# Patient Record
Sex: Male | Born: 1935
Health system: Southern US, Community
[De-identification: ages and names within clinical notes are randomized; demographics above are authoritative.]

## PROBLEM LIST (undated history)

## (undated) DIAGNOSIS — Z87442 Personal history of urinary calculi: Secondary | ICD-10-CM

## (undated) DIAGNOSIS — I10 Essential (primary) hypertension: Secondary | ICD-10-CM

## (undated) DIAGNOSIS — I5032 Chronic diastolic (congestive) heart failure: Secondary | ICD-10-CM

## (undated) DIAGNOSIS — E119 Type 2 diabetes mellitus without complications: Secondary | ICD-10-CM

## (undated) DIAGNOSIS — I350 Nonrheumatic aortic (valve) stenosis: Secondary | ICD-10-CM

## (undated) DIAGNOSIS — G629 Polyneuropathy, unspecified: Secondary | ICD-10-CM

## (undated) DIAGNOSIS — R0600 Dyspnea, unspecified: Secondary | ICD-10-CM

## (undated) DIAGNOSIS — Z8719 Personal history of other diseases of the digestive system: Secondary | ICD-10-CM

## (undated) DIAGNOSIS — L920 Granuloma annulare: Secondary | ICD-10-CM

## (undated) DIAGNOSIS — J449 Chronic obstructive pulmonary disease, unspecified: Secondary | ICD-10-CM

## (undated) DIAGNOSIS — N183 Chronic kidney disease, stage 3 unspecified: Secondary | ICD-10-CM

## (undated) DIAGNOSIS — N184 Chronic kidney disease, stage 4 (severe): Secondary | ICD-10-CM

## (undated) DIAGNOSIS — Z972 Presence of dental prosthetic device (complete) (partial): Secondary | ICD-10-CM

## (undated) DIAGNOSIS — D649 Anemia, unspecified: Secondary | ICD-10-CM

## (undated) DIAGNOSIS — R238 Other skin changes: Secondary | ICD-10-CM

## (undated) DIAGNOSIS — G4733 Obstructive sleep apnea (adult) (pediatric): Secondary | ICD-10-CM

## (undated) DIAGNOSIS — T4145XA Adverse effect of unspecified anesthetic, initial encounter: Secondary | ICD-10-CM

## (undated) DIAGNOSIS — T8859XA Other complications of anesthesia, initial encounter: Secondary | ICD-10-CM

## (undated) DIAGNOSIS — R233 Spontaneous ecchymoses: Secondary | ICD-10-CM

## (undated) DIAGNOSIS — I639 Cerebral infarction, unspecified: Secondary | ICD-10-CM

## (undated) DIAGNOSIS — E669 Obesity, unspecified: Secondary | ICD-10-CM

## (undated) DIAGNOSIS — F40240 Claustrophobia: Secondary | ICD-10-CM

## (undated) DIAGNOSIS — K219 Gastro-esophageal reflux disease without esophagitis: Secondary | ICD-10-CM

## (undated) DIAGNOSIS — R531 Weakness: Secondary | ICD-10-CM

## (undated) DIAGNOSIS — I2510 Atherosclerotic heart disease of native coronary artery without angina pectoris: Secondary | ICD-10-CM

## (undated) DIAGNOSIS — M1990 Unspecified osteoarthritis, unspecified site: Secondary | ICD-10-CM

## (undated) DIAGNOSIS — Z973 Presence of spectacles and contact lenses: Secondary | ICD-10-CM

## (undated) DIAGNOSIS — E785 Hyperlipidemia, unspecified: Secondary | ICD-10-CM

## (undated) DIAGNOSIS — N400 Enlarged prostate without lower urinary tract symptoms: Secondary | ICD-10-CM

## (undated) DIAGNOSIS — R197 Diarrhea, unspecified: Secondary | ICD-10-CM

## (undated) HISTORY — PX: CARDIAC CATHETERIZATION: SHX172

## (undated) HISTORY — PX: EYE SURGERY: SHX253

## (undated) HISTORY — DX: Type 2 diabetes mellitus without complications: E11.9

## (undated) HISTORY — PX: LIPOMA EXCISION: SHX5283

## (undated) HISTORY — PX: BLEPHAROPLASTY: SUR158

## (undated) HISTORY — PX: OTHER SURGICAL HISTORY: SHX169

## (undated) HISTORY — DX: Benign prostatic hyperplasia without lower urinary tract symptoms: N40.0

## (undated) HISTORY — PX: COLONOSCOPY: SHX174

## (undated) HISTORY — DX: Gastro-esophageal reflux disease without esophagitis: K21.9

## (undated) HISTORY — PX: LITHOTRIPSY: SUR834

## (undated) HISTORY — DX: Hyperlipidemia, unspecified: E78.5

## (undated) HISTORY — DX: Atherosclerotic heart disease of native coronary artery without angina pectoris: I25.10

## (undated) HISTORY — DX: Granuloma annulare: L92.0

---

## 2000-03-29 ENCOUNTER — Other Ambulatory Visit: Admission: RE | Admit: 2000-03-29 | Discharge: 2000-03-29 | Payer: Self-pay | Admitting: Urology

## 2000-06-07 ENCOUNTER — Other Ambulatory Visit: Admission: RE | Admit: 2000-06-07 | Discharge: 2000-06-07 | Payer: Self-pay | Admitting: Urology

## 2000-08-02 ENCOUNTER — Other Ambulatory Visit: Admission: RE | Admit: 2000-08-02 | Discharge: 2000-08-02 | Payer: Self-pay | Admitting: Urology

## 2011-04-24 DIAGNOSIS — M109 Gout, unspecified: Secondary | ICD-10-CM | POA: Diagnosis not present

## 2011-04-24 DIAGNOSIS — N400 Enlarged prostate without lower urinary tract symptoms: Secondary | ICD-10-CM | POA: Diagnosis not present

## 2011-04-24 DIAGNOSIS — L600 Ingrowing nail: Secondary | ICD-10-CM | POA: Diagnosis not present

## 2011-04-24 DIAGNOSIS — J449 Chronic obstructive pulmonary disease, unspecified: Secondary | ICD-10-CM | POA: Diagnosis not present

## 2011-04-24 DIAGNOSIS — I2510 Atherosclerotic heart disease of native coronary artery without angina pectoris: Secondary | ICD-10-CM | POA: Diagnosis not present

## 2011-04-24 DIAGNOSIS — E782 Mixed hyperlipidemia: Secondary | ICD-10-CM | POA: Diagnosis not present

## 2011-04-24 DIAGNOSIS — F17200 Nicotine dependence, unspecified, uncomplicated: Secondary | ICD-10-CM | POA: Diagnosis not present

## 2011-04-24 DIAGNOSIS — K219 Gastro-esophageal reflux disease without esophagitis: Secondary | ICD-10-CM | POA: Diagnosis not present

## 2011-05-01 DIAGNOSIS — M79609 Pain in unspecified limb: Secondary | ICD-10-CM | POA: Diagnosis not present

## 2011-05-01 DIAGNOSIS — M2010 Hallux valgus (acquired), unspecified foot: Secondary | ICD-10-CM | POA: Diagnosis not present

## 2011-05-01 DIAGNOSIS — L600 Ingrowing nail: Secondary | ICD-10-CM | POA: Diagnosis not present

## 2011-05-05 DIAGNOSIS — H6500 Acute serous otitis media, unspecified ear: Secondary | ICD-10-CM | POA: Diagnosis not present

## 2011-05-05 DIAGNOSIS — IMO0002 Reserved for concepts with insufficient information to code with codable children: Secondary | ICD-10-CM | POA: Diagnosis not present

## 2011-05-08 DIAGNOSIS — L600 Ingrowing nail: Secondary | ICD-10-CM | POA: Diagnosis not present

## 2011-05-10 ENCOUNTER — Encounter: Payer: Self-pay | Admitting: Pulmonary Disease

## 2011-05-11 ENCOUNTER — Encounter: Payer: Self-pay | Admitting: Pulmonary Disease

## 2011-05-11 ENCOUNTER — Ambulatory Visit (INDEPENDENT_AMBULATORY_CARE_PROVIDER_SITE_OTHER): Payer: Medicare Other | Admitting: Pulmonary Disease

## 2011-05-11 ENCOUNTER — Ambulatory Visit (INDEPENDENT_AMBULATORY_CARE_PROVIDER_SITE_OTHER)
Admission: RE | Admit: 2011-05-11 | Discharge: 2011-05-11 | Disposition: A | Discharge status: Home / Self Care | Payer: Medicare Other | Source: Ambulatory Visit | Attending: Pulmonary Disease | Admitting: Pulmonary Disease

## 2011-05-11 DIAGNOSIS — J42 Unspecified chronic bronchitis: Secondary | ICD-10-CM | POA: Diagnosis not present

## 2011-05-11 DIAGNOSIS — J449 Chronic obstructive pulmonary disease, unspecified: Secondary | ICD-10-CM

## 2011-05-11 DIAGNOSIS — G4733 Obstructive sleep apnea (adult) (pediatric): Secondary | ICD-10-CM | POA: Insufficient documentation

## 2011-05-11 NOTE — Patient Instructions (Addendum)
Breathing test showed lung capacity of 65% Chest xray Schedule sleep study for obstructive sleep apnea  Trial of spiriva instead of atrovent - stop if prostate problems worse- call for Rx if works [place] !!!

## 2011-05-11 NOTE — Progress Notes (Signed)
  Subjective:    Patient ID: Marco Acevedo, male    DOB: [birth year], 77 y.o.   MRN: [id number]  HPI 76/M,smoker ,  just moved back to [place] from [place] , presents for evaluation of COPD Uses [place] for meds usess foradil 2-3/wk, lost nebuliser during move, atrovent HFA as needed He also has CAD s/p stents x 2 on plavix, Htn on lisinopril & neuropathy He had 1 episode of bronchitis in the last 6 months, denies wheezing or pedal edema, occasional cough with clear phlegm. He started smoking at age 3, about 1PPd now, retired from [employer]. He has gained 14 lbs in the last 2 years. Spirometry showed moderate airway obstruction with FEV1 of 66% FVC of 71% and small airways at 55%.  CXR - no infiltrates, hyperinflation + Wife reports loud snoring & occasional witnessed apneas. He reports excessive daytime somnolence ESS 3/24 Bedtime is between 10 PM and midnight, sleep latency is 10 minutes, 3-4 nocturnal awakenings, out of bed at 8 AM feeling tired with dryness of mouth and occasional headache. He has gained 14 pounds over the last 2 years.  There is no history suggestive of cataplexy, sleep paralysis or parasomnias    Review of Systems  Constitutional: Negative for fever, appetite change and unexpected weight change.  HENT: Positive for ear pain, congestion, rhinorrhea and postnasal drip. Negative for sore throat, sneezing, trouble swallowing and dental problem.   Eyes: Negative for redness.  Respiratory: Positive for cough, shortness of breath and wheezing.   Cardiovascular: Negative for chest pain, palpitations and leg swelling.  Gastrointestinal: Negative for nausea, vomiting, abdominal pain and diarrhea.  Genitourinary: Negative for dysuria and urgency.  Musculoskeletal: Negative for joint swelling.  Skin: Negative for rash.  Neurological: Negative for syncope and headaches.  Hematological: Bruises/bleeds easily.  Psychiatric/Behavioral: Negative for dysphoric mood. The patient is not  nervous/anxious.        Objective:   Physical Exam  Gen. Pleasant, well-nourished, in no distress, normal affect ENT - no lesions, no post nasal drip Neck: No JVD, no thyromegaly, no carotid bruits Lungs: no use of accessory muscles, no dullness to percussion, decreased BL  without rales or rhonchi  Cardiovascular: Rhythm regular, heart sounds  normal, no murmurs or gallops, no peripheral edema Abdomen: soft and non-tender, no hepatosplenomegaly, BS normal. Musculoskeletal: No deformities, no cyanosis or clubbing Neuro:  alert, non focal       Assessment & Plan:

## 2011-05-11 NOTE — Assessment & Plan Note (Signed)
Given excessive daytime somnolence, narrow pharyngeal exam, witnessed apneas & loud snoring, obstructive sleep apnea is very likely & an overnight polysomnogram will be scheduled as a split study. The pathophysiology of obstructive sleep apnea , it's cardiovascular consequences & modes of treatment including CPAP were discused with the patient in detail & they evidenced understanding.  

## 2011-05-11 NOTE — Assessment & Plan Note (Signed)
Trial of spiriva instead of atrovent - stop if prostate problems worse- Stay on foradil for now. Pulm rehab in future Smoking cessation primary intervention here

## 2011-05-17 DIAGNOSIS — I2510 Atherosclerotic heart disease of native coronary artery without angina pectoris: Secondary | ICD-10-CM | POA: Diagnosis not present

## 2011-05-17 DIAGNOSIS — E119 Type 2 diabetes mellitus without complications: Secondary | ICD-10-CM | POA: Diagnosis not present

## 2011-05-17 DIAGNOSIS — E782 Mixed hyperlipidemia: Secondary | ICD-10-CM | POA: Diagnosis not present

## 2011-05-17 DIAGNOSIS — F17200 Nicotine dependence, unspecified, uncomplicated: Secondary | ICD-10-CM | POA: Diagnosis not present

## 2011-05-17 DIAGNOSIS — J449 Chronic obstructive pulmonary disease, unspecified: Secondary | ICD-10-CM | POA: Diagnosis not present

## 2011-05-18 DIAGNOSIS — L600 Ingrowing nail: Secondary | ICD-10-CM | POA: Diagnosis not present

## 2011-05-23 ENCOUNTER — Telehealth: Payer: Self-pay | Admitting: Pulmonary Disease

## 2011-05-23 ENCOUNTER — Ambulatory Visit (HOSPITAL_BASED_OUTPATIENT_CLINIC_OR_DEPARTMENT_OTHER): Payer: Medicare Other | Attending: Pulmonary Disease | Admitting: General Practice

## 2011-05-23 VITALS — Ht 69.0 in | Wt 210.0 lb

## 2011-05-23 DIAGNOSIS — G4733 Obstructive sleep apnea (adult) (pediatric): Secondary | ICD-10-CM | POA: Insufficient documentation

## 2011-05-23 DIAGNOSIS — Z7982 Long term (current) use of aspirin: Secondary | ICD-10-CM | POA: Diagnosis not present

## 2011-05-23 DIAGNOSIS — J449 Chronic obstructive pulmonary disease, unspecified: Secondary | ICD-10-CM | POA: Diagnosis not present

## 2011-05-23 DIAGNOSIS — R259 Unspecified abnormal involuntary movements: Secondary | ICD-10-CM | POA: Diagnosis not present

## 2011-05-23 DIAGNOSIS — F17200 Nicotine dependence, unspecified, uncomplicated: Secondary | ICD-10-CM | POA: Insufficient documentation

## 2011-05-23 DIAGNOSIS — J4489 Other specified chronic obstructive pulmonary disease: Secondary | ICD-10-CM | POA: Insufficient documentation

## 2011-05-23 DIAGNOSIS — Z79899 Other long term (current) drug therapy: Secondary | ICD-10-CM | POA: Insufficient documentation

## 2011-05-23 NOTE — Telephone Encounter (Signed)
LMOM for pt TCB 

## 2011-05-24 MED ORDER — TIOTROPIUM BROMIDE MONOHYDRATE 18 MCG IN CAPS: 18.0000 ug | ORAL_CAPSULE | Freq: Every day | RESPIRATORY_TRACT | Status: DC

## 2011-05-24 NOTE — Telephone Encounter (Signed)
Patient states he is having no problems with urination on Spiriva and would like an Rx faxed to the [place]. Patient aware RA is not back in the office until next week to sign prescription. I will print RX and last OV note and fax to [facility] after RA signs. Prescription and RX placed in RA look at, along with [facility] fax number.

## 2011-05-24 NOTE — Telephone Encounter (Signed)
Ok to send Rx 

## 2011-05-25 DIAGNOSIS — M79609 Pain in unspecified limb: Secondary | ICD-10-CM | POA: Diagnosis not present

## 2011-05-25 DIAGNOSIS — B353 Tinea pedis: Secondary | ICD-10-CM | POA: Diagnosis not present

## 2011-05-30 DIAGNOSIS — G4733 Obstructive sleep apnea (adult) (pediatric): Secondary | ICD-10-CM

## 2011-05-30 DIAGNOSIS — G4761 Periodic limb movement disorder: Secondary | ICD-10-CM

## 2011-05-30 NOTE — Procedures (Signed)
NAME:  Marco Acevedo, Marco Acevedo NO.:  [id number]  MEDICAL RECORD NO.:  [id number]          PATIENT TYPE:  OUT  LOCATION:  SLEEP CENTER                 FACILITY:  [facility]  PHYSICIAN:  Rigoberto Noel, MD      DATE OF BIRTH:  10-Apr-1935  DATE OF STUDY:  05/23/2011                           NOCTURNAL POLYSOMNOGRAM  REFERRING PHYSICIAN:  Rigoberto Noel, MD  INDICATION FOR STUDY:  Mr. Topping is a 77 year old smoker with moderate COPD, who presents with loud snoring, occasional witnessed apneas, excessive daytime somnolence, and a weight gain of 14 pounds over the last 2 years.  At the time of this study, he weighed 210 pounds with a height of 5 feet and 9 inches, BMI of 31.  Neck size of 19 inches.  EPWORTH SLEEPINESS SCORE:  4.  MEDICATIONS:  Clopidogrel, primidone, gabapentin, guaifenesin, gemfibrozil, Atrovent, albuterol, Spiriva, lisinopril, pravastatin, allopurinol, glyburide, finasteride, metformin, aspirin, iron, and B12.  This nocturnal polysomnogram was performed with sleep technologist in attendance.  EEG, EOG, EMG, EKG, and respiratory parameters were recorded.  Sleep stages, arousals, limb movements, and respiratory data were scored according to criteria laid out by the American Academy of Sleep Medicine.  SLEEP ARCHITECTURE:  Lights out was at 10:14 p.m., lights on was at 5:01 a.m.  Total sleep time was 347 minutes with a sleep period time of 401 minutes and a sleep efficiency of 85%.  Sleep latency was 6.5 minutes and latency to REM sleep was 232 minutes.  Sleep stages of the percentage of total sleep time was N1 15.6%, N2 66%, N3 0%, and REM sleep 18.6% (64 minutes).  Supine sleep was not noted.  Longest period of REM sleep was around 2 a.m.  AROUSAL DATA:  There were total of 46 arousals with an arousal index of 8 events per hour.  Of these, 17 were spontaneous, 18 were associated with limb movements, and the rest with respiratory events.  RESPIRATORY  DATA:  There were a total of 1 obstructive apnea, 0 central apnea, 0, mixed apnea, 29 hypopneas with an apnea-hypopnea index of 5 events per hour.  Most of these events were noted during REM sleep with REM related AHI of 22 events per hour.  OXYGEN DATA:  The desaturation index was 8 events per hour.  The lowest desaturation was 84%.  He spent 25 minutes with a saturation less than 88%.  Oxygen desaturations were noted during REM sleep in the absence of respiratory events.  CARDIAC DATA:  The low heart rate was 35 beats per minute.  The high heart rate recorded was an artifact.  Few PACs were noted.  MOVEMENT-PARASOMNIA:  The limb movement index was 88 events per hour. Numerous limb movements were noted, however, PLM arousal index was only 3 events per hour.  DISCUSSION:  Oxygen desaturation in the absence of respiratory events, absence of supine sleep was noted.  IMPRESSIONS-RECOMMENDATIONS: 1. Mild obstructive sleep apnea with predominant hypopneas during REM     sleep causing mild sleep fragmentation. 2. Significant oxygen desaturations, especially during REM sleep may     be related to underlying pulmonary disease. 3. Significant periodic limb movements were noted; however,  very few     of these were associated with arousals.  Significance of this is     unclear. 4. No evidence of cardiac arrhythmias or behavioral disturbance during     sleep.  Recommendation: 1. Oxygen desaturation seem to be primarily related to pulmonary     disease and may be corrected by supplemental oxygen during sleep.     2 L of oxygen during sleep would be recommended. 2. Doubt that primary treatment for obstructive sleep apnea is     necessary here.  Weight loss would be advised.  If the patient is     symptomatic, CPAP therapy or oral appliance can be considered. 3. He should be advised against medications sedative side effects.  He     should be cautioned against driving when sleepy. 4. Iron  and ferritin levels can be checked.     Rigoberto Noel, MD    RVA/MEDQ  D:  05/30/2011 08:19:05  T:  05/30/2011 08:53:12  Job:  [id number]

## 2011-07-04 DIAGNOSIS — R5383 Other fatigue: Secondary | ICD-10-CM | POA: Diagnosis not present

## 2011-07-04 DIAGNOSIS — M5430 Sciatica, unspecified side: Secondary | ICD-10-CM | POA: Diagnosis not present

## 2011-07-10 DIAGNOSIS — M545 Low back pain: Secondary | ICD-10-CM | POA: Diagnosis not present

## 2011-07-17 ENCOUNTER — Ambulatory Visit (INDEPENDENT_AMBULATORY_CARE_PROVIDER_SITE_OTHER): Payer: Medicare Other | Admitting: Pulmonary Disease

## 2011-07-17 ENCOUNTER — Encounter: Payer: Self-pay | Admitting: Pulmonary Disease

## 2011-07-17 VITALS — BP 122/60 | HR 83 | Temp 98.7°F | Ht 68.5 in | Wt 227.2 lb

## 2011-07-17 DIAGNOSIS — J449 Chronic obstructive pulmonary disease, unspecified: Secondary | ICD-10-CM | POA: Diagnosis not present

## 2011-07-17 DIAGNOSIS — G4733 Obstructive sleep apnea (adult) (pediatric): Secondary | ICD-10-CM

## 2011-07-17 NOTE — Assessment & Plan Note (Signed)
We will recheck your oxygen levels in your sleep [facility]

## 2011-07-17 NOTE — Progress Notes (Signed)
  Subjective:    Patient ID: Marco Acevedo, male    DOB: [birth year], 77 y.o.   MRN: [id number]  HPI 76/M,smoker , moved back to [place] from [place] , presents for fu of COPD & sleep related hypoxia. U[facility] for meds  usess foradil 2-3/wk, lost nebuliser during move, atrovent HFA as needed  He also has CAD s/p stents x 2 on plavix, Htn on lisinopril & neuropathy  He had 1 episode of bronchitis in the last 6 months, denies wheezing or pedal edema, occasional cough with clear phlegm. He started smoking at age 72, retired from [employer]. He has gained 14 lbs in the last 2 years.  Spirometry showed moderate airway obstruction with FEV1 of 66% FVC of 71% and small airways at 55%. CXR - no infiltrates, hyperinflation +  Wife reports loud snoring & occasional witnessed apneas. He reports excessive daytime somnolence  ESS 3/24  Bedtime is between 10 PM and midnight, sleep latency is 10 minutes, 3-4 nocturnal awakenings, out of bed at 8 AM feeling tired with dryness of mouth and occasional headache. He has gained 14 pounds over the last 2 years.    07/17/2011 Quit smoking x 9 wks, gained 10 lbs Used Rx nicotine patches Pt states his breathing has been good. quit smoking in feb. Pt stopped taking spiriva 3 weeks ago bc he felt like he didnt need to take it. Has very little cough w/ occasional brown phlem, occasional wheezing and chest tx. Pt also wants to discuss sleep study results from [facility] showed 25 min desatn < 88%, AHI was 5/h, REm 68 mins with REM related AHI 22/h Significant PLMs were noted, he reports neuropathy & is on iron pills already  Review of Systems Patient denies significant dyspnea,cough, hemoptysis,  chest pain, palpitations, pedal edema, orthopnea, paroxysmal nocturnal dyspnea, lightheadedness, nausea, vomiting, abdominal or  leg pains      Objective:   Physical Exam  Gen. Pleasant, well-nourished, in no distress ENT - no lesions, no post nasal drip, class 2 airway Neck: No  JVD, no thyromegaly, no carotid bruits Lungs: no use of accessory muscles, no dullness to percussion, clear without rales or rhonchi  Cardiovascular: Rhythm regular, heart sounds  normal, no murmurs or gallops, no peripheral edema Musculoskeletal: No deformities, no cyanosis or clubbing        Assessment & Plan:

## 2011-07-17 NOTE — Patient Instructions (Addendum)
We will try to enroll you in a rehab program [place] to stay off breathing meds We will recheck your oxygen levels in your sleep [facility] in [facility]3 months Congratulations on quitting smoking !!

## 2011-07-17 NOTE — Assessment & Plan Note (Signed)
We will try to enroll you in a rehab program [place] to stay off breathing meds Congratulations on quitting smoking !!

## 2011-07-25 DIAGNOSIS — G4733 Obstructive sleep apnea (adult) (pediatric): Secondary | ICD-10-CM | POA: Diagnosis not present

## 2011-07-31 ENCOUNTER — Telehealth: Payer: Self-pay | Admitting: Pulmonary Disease

## 2011-07-31 DIAGNOSIS — M76899 Other specified enthesopathies of unspecified lower limb, excluding foot: Secondary | ICD-10-CM | POA: Diagnosis not present

## 2011-07-31 NOTE — Telephone Encounter (Signed)
ONO on RA showed only 5 min desatn < 88% Does not need oxygen

## 2011-08-01 DIAGNOSIS — D235 Other benign neoplasm of skin of trunk: Secondary | ICD-10-CM | POA: Diagnosis not present

## 2011-08-01 DIAGNOSIS — L538 Other specified erythematous conditions: Secondary | ICD-10-CM | POA: Diagnosis not present

## 2011-08-01 NOTE — Telephone Encounter (Signed)
I spoke with patient about results and he verbalized understanding and had no questions 

## 2011-08-02 ENCOUNTER — Encounter: Payer: Self-pay | Admitting: Pulmonary Disease

## 2011-08-10 DIAGNOSIS — M76899 Other specified enthesopathies of unspecified lower limb, excluding foot: Secondary | ICD-10-CM | POA: Diagnosis not present

## 2011-08-25 DIAGNOSIS — J309 Allergic rhinitis, unspecified: Secondary | ICD-10-CM | POA: Diagnosis not present

## 2011-08-25 DIAGNOSIS — J0100 Acute maxillary sinusitis, unspecified: Secondary | ICD-10-CM | POA: Diagnosis not present

## 2011-08-25 DIAGNOSIS — J449 Chronic obstructive pulmonary disease, unspecified: Secondary | ICD-10-CM | POA: Diagnosis not present

## 2011-09-01 DIAGNOSIS — M533 Sacrococcygeal disorders, not elsewhere classified: Secondary | ICD-10-CM | POA: Diagnosis not present

## 2011-09-07 DIAGNOSIS — M533 Sacrococcygeal disorders, not elsewhere classified: Secondary | ICD-10-CM | POA: Diagnosis not present

## 2011-09-12 ENCOUNTER — Encounter (HOSPITAL_COMMUNITY): Payer: Self-pay

## 2011-09-12 ENCOUNTER — Encounter (HOSPITAL_COMMUNITY)
Admission: RE | Admit: 2011-09-12 | Discharge: 2011-09-12 | Disposition: A | Discharge status: Home / Self Care | Payer: Medicare Other | Source: Ambulatory Visit | Attending: Pulmonary Disease | Admitting: Pulmonary Disease

## 2011-09-12 DIAGNOSIS — J449 Chronic obstructive pulmonary disease, unspecified: Secondary | ICD-10-CM | POA: Insufficient documentation

## 2011-09-12 DIAGNOSIS — G4733 Obstructive sleep apnea (adult) (pediatric): Secondary | ICD-10-CM | POA: Insufficient documentation

## 2011-09-12 DIAGNOSIS — J4489 Other specified chronic obstructive pulmonary disease: Secondary | ICD-10-CM | POA: Insufficient documentation

## 2011-09-12 DIAGNOSIS — Z5189 Encounter for other specified aftercare: Secondary | ICD-10-CM | POA: Insufficient documentation

## 2011-09-12 DIAGNOSIS — E119 Type 2 diabetes mellitus without complications: Secondary | ICD-10-CM | POA: Insufficient documentation

## 2011-09-12 HISTORY — DX: Essential (primary) hypertension: I10

## 2011-09-12 NOTE — Progress Notes (Signed)
Mr. Gest came in today for orientation to Pulmonary Rehab.  History and medications reviewed with patient.  Nurse assessment done.  Goals for rehab discussed.  6 min walk test done, vital signs stable, oxygen levels stable 94-95% on room air.  1210 feet in 6 min.  Demonstration and practice of PLB using pulse oximeter.  Patient able to return demonstration satisfactorily. Safety and hand hygiene in the exercise area reviewed with patient.  Patient voices understanding.  He plans to start exercise on Tuesday  09-19-11.

## 2011-09-14 ENCOUNTER — Encounter (HOSPITAL_COMMUNITY): Payer: Medicare Other

## 2011-09-19 ENCOUNTER — Encounter (HOSPITAL_COMMUNITY): Payer: Medicare Other

## 2011-09-19 ENCOUNTER — Encounter (HOSPITAL_COMMUNITY)
Admission: RE | Admit: 2011-09-19 | Discharge: 2011-09-19 | Disposition: A | Discharge status: Home / Self Care | Payer: Medicare Other | Source: Ambulatory Visit | Attending: Pulmonary Disease | Admitting: Pulmonary Disease

## 2011-09-19 DIAGNOSIS — G4733 Obstructive sleep apnea (adult) (pediatric): Secondary | ICD-10-CM | POA: Diagnosis not present

## 2011-09-19 DIAGNOSIS — E119 Type 2 diabetes mellitus without complications: Secondary | ICD-10-CM | POA: Diagnosis not present

## 2011-09-19 DIAGNOSIS — Z5189 Encounter for other specified aftercare: Secondary | ICD-10-CM | POA: Diagnosis not present

## 2011-09-19 DIAGNOSIS — J449 Chronic obstructive pulmonary disease, unspecified: Secondary | ICD-10-CM | POA: Diagnosis not present

## 2011-09-19 NOTE — Progress Notes (Signed)
1st day of exercise for Marco Acevedo.  He was oriented to equipment use, RPE and Dyspnea scale, safety, rest breaks.  Vital signs stable, oxygen levels were stable above 90% on room air.  Demonstration and practice of PLB on each piece of equipment.  Tolerated well.

## 2011-09-21 ENCOUNTER — Encounter (HOSPITAL_COMMUNITY)
Admission: RE | Admit: 2011-09-21 | Discharge: 2011-09-21 | Disposition: A | Discharge status: Home / Self Care | Payer: Medicare Other | Source: Ambulatory Visit | Attending: Pulmonary Disease | Admitting: Pulmonary Disease

## 2011-09-21 ENCOUNTER — Encounter (HOSPITAL_COMMUNITY): Payer: Medicare Other

## 2011-09-21 NOTE — Progress Notes (Signed)
Marco Acevedo 77 y.o. male Nutrition Note Time Spent: 55 minutes Spoke with pt and pt's wife. Pt is obese. Pt is interested in losing wt. Per pt, he has gained 25# since he quit smoking in 05/2011. Per medical record, pt has gained 21# over the past 4 months. Per pt, he was going to the [place] and trying to eat healthier a few months before starting Pulmonary Rehab. Pt's Rate Your Plate reviewed with pt and pt's wife. There are many areas for improvement in pt diet for pt diet to be healthy. Pt does not avoid salty food; uses canned/ convenience food and eats out 5-6 times a week. Pt states he eats out for "linner" because his wife has "so many MD appointments around lunchtime." Pt and pt's wife encouraged to schedule appointments later in the day to avoid having to eat out frequently.  The role of sodium in lung disease reviewed with pt.  Pt is diabetic and does not recall what his last A1c was. Pt taking Metformin and Glyburide. Per discussion, pt experiences hypoglycemia occasionally.  Pt unaware how his DM medications work. The action of pt DM medications explained to pt and pt wife. Pt unable to recall recommended CBG ranges before meals and 1-2 hours after meals.  Recommended CBG ranges reviewed with pt.  Pt able to recall some of the hypoglycemia treatment protocol. Pt states he has previously treated hypoglycemia with "a couple candy bars or juice." Pt encouraged to avoid candy bars. Treatment of hypoglycemia reviewed with pt and pt's wife.  Pt expressed understanding.   Nutrition Diagnosis   Excessive sodium intake related to over consumption of processed food as evidenced by frequent consumption of convenience food/ canned vegetables and eating out frequently.   Food-and nutrition-related knowledge deficit related to lack of exposure to information as related to diagnosis of pulmonary disease   Obesity related to excessive energy intake as evidenced by a BMI of 35.8   Limited adherence to  nutrition-related recommendations related to poor understanding as evidenced by food history. Nutrition Rx/Est. Daily Nutrition Needs for: ? wt loss 1450-1950 Kcal  85-105 gm protein   1500 mg or less sodium     175-250 gm CHO Nutrition Intervention   Pt's individual nutrition plan and goals reviewed with pt.   Benefits of adopting healthy eating habits discussed when pt's Rate Your Plate reviewed.   Handout given for 5 day, 1500 kcal menu ideas   Pt to attend the Nutrition and Lung Disease class   Continual client-centered nutrition education by RD, as part of interdisciplinary care. Goal(s) 1. Pt to identify and limit food sources of sodium. 2. Describe the benefit of including fruits, vegetables, whole grains, and low-fat dairy products in a healthy meal plan. 3. Use pre-/post meal and/or exercise CBG's and A1c to determine whether adjustments in food/meal planning will be beneficial or if any meds need to be combined with nutrition therapy. Monitor and Evaluate progress toward nutrition goal with team.    Pulmonary Rehab Nutrition Screen  Marco Acevedo 77 y.o. male             Ht: 67.5" Ht Readings from Last 1 Encounters:  07/17/11 5' 8.5" (1.74 m)    Wt:   231.2 lb (105.1 kg) Wt Readings from Last 3 Encounters:  07/17/11 227 lb 3.2 oz (103.057 kg)  05/23/11 210 lb (95.255 kg)  05/11/11 222 lb 6.4 oz (100.88 kg)    BMI: 35.8  31.1%body fat  Rate Your Plate Score: 34  Please answer the following questions:             YES  NO    Do you live in a nursing home?  X   Do you eat out more than 3 times per week?   X  If yes, how many times per week do you eat out? 5 or 6  Do you have food allergies?   X If yes, what are you allergic to?  Have you gained or lost more than 10 lbs without trying?              X  If yes, how much weight have you  gained? 25 lbs over 16 weeks   Do you want to lose weight?    X  If yes, what is a goal weight or amount of weight you  would like to lose? 30 lbs  Do you eat alone most of the time?   X   Do you eat less than 2 meals/day?  X If yes, how many meals do you eat?  Do you use canned and convenience food?     Do you use a salt shaker?  X   Do you drink more than 3 alcoholic drinks/day?  X If yes, how many drinks per day?  Are you having trouble with constipation? *  X If yes, what are you doing to help relieve constipation?  Do you have financial difficulties with buying food? *  X   Do you usually need help with grocery shopping or with cooking? *  X   Do you have a poor appetite? *                                       X   Do you have trouble chewing/ swallowing? *   X   Do you take vitamin and mineral or herbal supplements? *  X If yes, what kind of supplements do you currently take?    Past Medical History  Diagnosis Date  . Diabetes mellitus type II   . Hyperlipidemia   . CAD (coronary artery disease)   . GERD (gastroesophageal reflux disease)   . BPH (benign prostatic hyperplasia)   . Granuloma annulare   . Gout   . Hypertension    Labs Lipids Lipid Panel  No results found for this basename: chol, trig, hdl, cholhdl, vldl, ldlcalc   No results found for this basename: HGBA1C    Nutrition Risk Level: High

## 2011-09-25 DIAGNOSIS — M76899 Other specified enthesopathies of unspecified lower limb, excluding foot: Secondary | ICD-10-CM | POA: Diagnosis not present

## 2011-09-26 ENCOUNTER — Encounter (HOSPITAL_COMMUNITY): Payer: Medicare Other

## 2011-09-26 ENCOUNTER — Encounter (HOSPITAL_COMMUNITY)
Admission: RE | Admit: 2011-09-26 | Discharge: 2011-09-26 | Disposition: A | Discharge status: Home / Self Care | Payer: Medicare Other | Source: Ambulatory Visit | Attending: Pulmonary Disease | Admitting: Pulmonary Disease

## 2011-09-26 NOTE — Progress Notes (Signed)
Completed home exercise with patient. Reviewed exercise progression, routine, exercising at a comfortable pace, RPE/Dyspnea scales, how important it is to own a pulse oximeter and how to use one, weather conditions, warning signs and symptoms with exercise, and CP/NTG. We discussed when to call MD. Patient voices understanding. Patient has a goal to learn how to conserve energy. Will continue to encourage and support.  Fatiha Guzy L. Owens Shark, MS, NASM, CES

## 2011-09-28 ENCOUNTER — Encounter (HOSPITAL_COMMUNITY): Payer: Medicare Other

## 2011-09-28 ENCOUNTER — Encounter (HOSPITAL_COMMUNITY)
Admission: RE | Admit: 2011-09-28 | Discharge: 2011-09-28 | Disposition: A | Discharge status: Home / Self Care | Payer: Medicare Other | Source: Ambulatory Visit | Attending: Pulmonary Disease | Admitting: Pulmonary Disease

## 2011-10-02 ENCOUNTER — Inpatient Hospital Stay (HOSPITAL_COMMUNITY)
Admission: EM | Admit: 2011-10-02 | Discharge: 2011-10-04 | DRG: 287 | Disposition: A | Discharge status: Home / Self Care | Payer: Medicare Other | Source: Ambulatory Visit | Attending: Interventional Cardiology | Admitting: Interventional Cardiology

## 2011-10-02 ENCOUNTER — Emergency Department (HOSPITAL_COMMUNITY): Payer: Medicare Other

## 2011-10-02 ENCOUNTER — Encounter (HOSPITAL_COMMUNITY): Payer: Self-pay | Admitting: Emergency Medicine

## 2011-10-02 DIAGNOSIS — E119 Type 2 diabetes mellitus without complications: Secondary | ICD-10-CM | POA: Diagnosis present

## 2011-10-02 DIAGNOSIS — I10 Essential (primary) hypertension: Secondary | ICD-10-CM | POA: Diagnosis present

## 2011-10-02 DIAGNOSIS — R0989 Other specified symptoms and signs involving the circulatory and respiratory systems: Secondary | ICD-10-CM | POA: Diagnosis not present

## 2011-10-02 DIAGNOSIS — Z9861 Coronary angioplasty status: Secondary | ICD-10-CM

## 2011-10-02 DIAGNOSIS — N289 Disorder of kidney and ureter, unspecified: Secondary | ICD-10-CM | POA: Diagnosis present

## 2011-10-02 DIAGNOSIS — R0600 Dyspnea, unspecified: Secondary | ICD-10-CM

## 2011-10-02 DIAGNOSIS — R918 Other nonspecific abnormal finding of lung field: Secondary | ICD-10-CM | POA: Diagnosis not present

## 2011-10-02 DIAGNOSIS — Z87891 Personal history of nicotine dependence: Secondary | ICD-10-CM

## 2011-10-02 DIAGNOSIS — R0789 Other chest pain: Secondary | ICD-10-CM | POA: Diagnosis not present

## 2011-10-02 DIAGNOSIS — Z7902 Long term (current) use of antithrombotics/antiplatelets: Secondary | ICD-10-CM

## 2011-10-02 DIAGNOSIS — M109 Gout, unspecified: Secondary | ICD-10-CM | POA: Diagnosis present

## 2011-10-02 DIAGNOSIS — J4489 Other specified chronic obstructive pulmonary disease: Secondary | ICD-10-CM | POA: Diagnosis present

## 2011-10-02 DIAGNOSIS — E785 Hyperlipidemia, unspecified: Secondary | ICD-10-CM | POA: Diagnosis present

## 2011-10-02 DIAGNOSIS — I200 Unstable angina: Secondary | ICD-10-CM

## 2011-10-02 DIAGNOSIS — J9819 Other pulmonary collapse: Secondary | ICD-10-CM | POA: Diagnosis not present

## 2011-10-02 DIAGNOSIS — I2510 Atherosclerotic heart disease of native coronary artery without angina pectoris: Principal | ICD-10-CM | POA: Diagnosis present

## 2011-10-02 DIAGNOSIS — R0602 Shortness of breath: Secondary | ICD-10-CM | POA: Diagnosis not present

## 2011-10-02 DIAGNOSIS — K219 Gastro-esophageal reflux disease without esophagitis: Secondary | ICD-10-CM | POA: Diagnosis present

## 2011-10-02 DIAGNOSIS — R0609 Other forms of dyspnea: Secondary | ICD-10-CM | POA: Diagnosis not present

## 2011-10-02 DIAGNOSIS — Z7982 Long term (current) use of aspirin: Secondary | ICD-10-CM

## 2011-10-02 DIAGNOSIS — R079 Chest pain, unspecified: Secondary | ICD-10-CM

## 2011-10-02 DIAGNOSIS — Z79899 Other long term (current) drug therapy: Secondary | ICD-10-CM

## 2011-10-02 DIAGNOSIS — N400 Enlarged prostate without lower urinary tract symptoms: Secondary | ICD-10-CM | POA: Diagnosis present

## 2011-10-02 DIAGNOSIS — J449 Chronic obstructive pulmonary disease, unspecified: Secondary | ICD-10-CM | POA: Diagnosis present

## 2011-10-02 LAB — CBC
HCT: 35.5 % — ABNORMAL LOW (ref 39.0–52.0)
Hemoglobin: 12.2 g/dL — ABNORMAL LOW (ref 13.0–17.0)
MCH: 29.8 pg (ref 26.0–34.0)
MCHC: 34.4 g/dL (ref 30.0–36.0)
MCV: 86.8 fL (ref 78.0–100.0)

## 2011-10-02 LAB — BASIC METABOLIC PANEL
BUN: 29 mg/dL — ABNORMAL HIGH (ref 6–23)
Chloride: 101 mEq/L (ref 96–112)
Creatinine, Ser: 1.36 mg/dL — ABNORMAL HIGH (ref 0.50–1.35)
GFR calc Af Amer: 58 mL/min — ABNORMAL LOW (ref >90)

## 2011-10-02 LAB — DIFFERENTIAL
Basophils Relative: 1 % (ref 0–1)
Eosinophils Absolute: 0.1 10*3/uL (ref 0.0–0.7)
Monocytes Absolute: 0.4 10*3/uL (ref 0.1–1.0)
Monocytes Relative: 7 % (ref 3–12)
Neutro Abs: 3.1 10*3/uL (ref 1.7–7.7)

## 2011-10-02 LAB — CK TOTAL AND CKMB (NOT AT ARMC): Relative Index: INVALID (ref 0.0–2.5)

## 2011-10-02 MED ORDER — NITROGLYCERIN 0.4 MG SL SUBL
0.4000 mg | SUBLINGUAL_TABLET | SUBLINGUAL | Status: DC | PRN
  Administered 2011-10-03 (×2): 0.4 mg via SUBLINGUAL
  Filled 2011-10-02: qty 25

## 2011-10-02 MED ORDER — ASPIRIN 81 MG PO CHEW
324.0000 mg | CHEWABLE_TABLET | Freq: Once | ORAL | Status: AC
  Administered 2011-10-02: 324 mg via ORAL
  Filled 2011-10-02: qty 4

## 2011-10-02 NOTE — ED Provider Notes (Addendum)
History     CSN: [id number]  Arrival date & time 10/02/11  2049   First MD Initiated Contact with Marco Acevedo 10/02/11 2136      Chief Complaint  Marco Acevedo presents with  . Shortness of Breath  . Chest Pain    (Consider location/radiation/quality/duration/timing/severity/associated sxs/prior treatment) HPI Comments: Marco Acevedo presents today complaining of intermittent chest pressure throughout the day.  He also notes it's associated with shortness of breath.  Marco Acevedo states he felt more short of breath this morning and so when he went to the gym to do his normal workout he was only able to complete half of his workout because he felt winded.  This afternoon he went to visit a family member and noted that he felt short of breath with chest pressure again was doing his pursed lip breathing for his COPD but still did not feel better.  He tried taking his breathing which he felt helped briefly.  He also tried taking some Maalox which also helped briefly.  He tried some nitroglycerin which also helped.  Benefit completely relieved his chest pressure that he was experiencing.  Marco Acevedo denies any increasing cough or fevers.  No nausea or vomiting.  He did have some associated diaphoresis with this episode the afternoon.  On evaluation here he still has some mild chest pressure but he states it's not as severe as it was earlier.  Marco Acevedo is a 77 y.o. male presenting with shortness of breath and chest pain. The history is provided by the Marco Acevedo.  Shortness of Breath  The current episode started today. The problem is moderate. Associated symptoms include chest pain and shortness of breath. Pertinent negatives include no fever and no cough.  Chest Pain Primary symptoms include shortness of breath. Pertinent negatives for primary symptoms include no fever, no cough, no abdominal pain, no nausea and no vomiting.  Associated symptoms include diaphoresis.     Past Medical History  Diagnosis Date  . Diabetes  mellitus type II   . Hyperlipidemia   . CAD (coronary artery disease)   . GERD (gastroesophageal reflux disease)   . BPH (benign prostatic hyperplasia)   . Granuloma annulare   . Gout   . Hypertension     Past Surgical History  Procedure Date  . Heart stent     x2    Family History  Problem Relation Age of Onset  . Aortic aneurysm Father   . Aneurysm Mother     brain  . Cancer Brother   . COPD Sister     History  Substance Use Topics  . Smoking status: Former Smoker -- 1.0 packs/day for 64 years    Types: Cigarettes    Quit date: 05/29/2011  . Smokeless tobacco: Never Used  . Alcohol Use: Yes     rare      Review of Systems  Constitutional: Positive for diaphoresis. Negative for fever and chills.  HENT: Negative.   Eyes: Negative.   Respiratory: Positive for shortness of breath. Negative for cough.   Cardiovascular: Positive for chest pain.  Gastrointestinal: Negative.  Negative for nausea, vomiting and abdominal pain.  Genitourinary: Negative.   Musculoskeletal: Negative.  Negative for back pain.  Skin: Negative.  Negative for color change and rash.  Neurological: Negative for syncope and headaches.  Hematological: Negative.  Negative for adenopathy.  Psychiatric/Behavioral: Negative.  Negative for confusion.  All other systems reviewed and are negative.    Allergies  Review of Marco Acevedo's allergies indicates no known allergies.  Home  Medications   Current Outpatient Rx  Name Route Sig Dispense Refill  . ALLOPURINOL 300 MG PO TABS Oral Take 300 mg by mouth daily.    . ASPIRIN EC 81 MG PO TBEC Oral Take 81 mg by mouth daily.    [name] CLOPIDOGREL BISULFATE 75 MG PO TABS Oral Take 75 mg by mouth daily.    . IRON 325 (65 FE) MG PO TABS Oral Take 1 tablet by mouth daily.    [name] FINASTERIDE 5 MG PO TABS Oral Take 5 mg by mouth daily.    [name] FLUTICASONE PROPIONATE 50 MCG/ACT NA SUSP Nasal Place 2 sprays into the nose daily.    [name] GABAPENTIN 400 MG PO CAPS Oral Take  400 mg by mouth 2 (two) times daily.     [name] GEMFIBROZIL 600 MG PO TABS Oral Take 600 mg by mouth daily.    . GLYBURIDE 2.5 MG PO TABS Oral Take 2.5 mg by mouth daily with breakfast.    . IPRATROPIUM BROMIDE HFA 17 MCG/ACT IN AERS Inhalation Inhale 2 puffs into the lungs 4 (four) times daily as needed.     [name] LISINOPRIL 5 MG PO TABS Oral Take 2.5 mg by mouth daily.    [name] METFORMIN HCL 1000 MG PO TABS Oral Take 1,000 mg by mouth 2 (two) times daily with a meal.     . OMEPRAZOLE 20 MG PO CPDR Oral Take 40 mg by mouth daily.    [name] PRAVASTATIN SODIUM 40 MG PO TABS Oral Take 40 mg by mouth daily.    [name] PRIMIDONE 50 MG PO TABS Oral Take 50 mg by mouth 2 (two) times daily.    [name] TIOTROPIUM BROMIDE MONOHYDRATE 18 MCG IN CAPS Inhalation Place 1 capsule (18 mcg total) into inhaler and inhale daily. 30 capsule 5  . VITAMIN B-12 1000 MCG PO TABS Oral Take 1,000 mcg by mouth daily.    . GUAIFENESIN 400 MG PO TABS Oral Take 400 mg by mouth as needed. For cough      BP 143/66  Pulse 77  Temp 98 F (36.7 C) (Oral)  Resp 14  SpO2 99%  Physical Exam  Nursing note and vitals reviewed. Constitutional: He is oriented to person, place, and time. He appears well-developed and well-nourished.  Non-toxic appearance. He does not have a sickly appearance.  HENT:  Head: Normocephalic and atraumatic.  Eyes: Conjunctivae, EOM and lids are normal. Pupils are equal, round, and reactive to light.  Neck: Trachea normal, normal range of motion and full passive range of motion without pain. Neck supple.  Cardiovascular: Normal rate, regular rhythm and normal heart sounds.  Exam reveals no gallop.   No murmur heard. Pulmonary/Chest: Effort normal and breath sounds normal. No respiratory distress. He has no wheezes. He has no rales.  Abdominal: Soft. Normal appearance. He exhibits no distension. There is no tenderness. There is no rebound and no CVA tenderness.  Musculoskeletal: Normal range of motion. He exhibits no edema.    Neurological: He is alert and oriented to person, place, and time. He has normal strength.  Skin: Skin is warm, dry and intact. No rash noted.  Psychiatric: He has a normal mood and affect. His behavior is normal. Judgment and thought content normal.    ED Course  Procedures (including critical care time)  Results for orders placed during the hospital encounter of 10/02/11  CBC      Component Value Range   WBC 5.7  4.0 - 10.5 K/uL  RBC 4.09 (*) 4.22 - 5.81 MIL/uL   Hemoglobin 12.2 (*) 13.0 - 17.0 g/dL   HCT 35.5 (*) 39.0 - 52.0 %   MCV 86.8  78.0 - 100.0 fL   MCH 29.8  26.0 - 34.0 pg   MCHC 34.4  30.0 - 36.0 g/dL   RDW 14.1  11.5 - 15.5 %   Platelets 253  150 - 400 K/uL  DIFFERENTIAL      Component Value Range   Neutrophils Relative 54  43 - 77 %   Neutro Abs 3.1  1.7 - 7.7 K/uL   Lymphocytes Relative 36  12 - 46 %   Lymphs Abs 2.0  0.7 - 4.0 K/uL   Monocytes Relative 7  3 - 12 %   Monocytes Absolute 0.4  0.1 - 1.0 K/uL   Eosinophils Relative 3  0 - 5 %   Eosinophils Absolute 0.1  0.0 - 0.7 K/uL   Basophils Relative 1  0 - 1 %   Basophils Absolute 0.0  0.0 - 0.1 K/uL  BASIC METABOLIC PANEL      Component Value Range   Sodium 135  135 - 145 mEq/L   Potassium 4.9  3.5 - 5.1 mEq/L   Chloride 101  96 - 112 mEq/L   CO2 24  19 - 32 mEq/L   Glucose, Bld 175 (*) 70 - 99 mg/dL   BUN 29 (*) 6 - 23 mg/dL   Creatinine, Ser 1.36 (*) 0.50 - 1.35 mg/dL   Calcium 9.5  8.4 - 10.5 mg/dL   GFR calc non Af Amer 50 (*) >90 mL/min   GFR calc Af Amer 58 (*) >90 mL/min  TROPONIN I      Component Value Range   Troponin I <0.30  <0.30 ng/mL  CK TOTAL AND CKMB      Component Value Range   Total CK 86  7 - 232 U/L   CK, MB 3.9  0.3 - 4.0 ng/mL   Relative Index RELATIVE INDEX IS INVALID  0.0 - 2.5   Dg Chest 2 View  10/02/2011  *RADIOLOGY REPORT*  Clinical Data: 77 year old male with chest pressure shortness of breath.  CHEST - 2 VIEW  Comparison: 05/11/2011.  Findings: Stable to slightly  lower lung volumes.  Cardiac size and mediastinal contours are within normal limits.  Visualized tracheal air column is within normal limits.  Stable to mildly increased retrocardiac opacity which most resembles atelectasis.  No pneumothorax, pulmonary edema, pleural effusion or consolidation. No acute osseous abnormality identified.  IMPRESSION: Mildly increased atelectasis, otherwise no acute cardiopulmonary abnormality.  Original Report Authenticated By: Randall An, M.D.      Date: 10/02/2011  Rate: 81  Rhythm: normal sinus rhythm  QRS Axis: normal  Intervals: normal  ST/T Wave abnormalities: normal  Conduction Disutrbances:none  Narrative Interpretation:   Old EKG Reviewed: unchanged from 08-12-97    MDM  Marco Acevedo with symptoms that are concerning for possible unstable angina.  He said intermittent chest pressure and shortness of breath today.  He does not appear to be having an acute COPD exacerbation as his lungs sound clear and examination his oxygen saturations are normal.  He has no findings of pneumonia on his chest x-ray.  He is no swelling or signs of pulmonary edema to suggest a CHF exacerbation.  His initial EKG and cardiac markers are negative.  He notes only a very dull amount of chest pressure now and does not want further medications for it.  As Marco Acevedo does have known cardiac disease with 2 prior stents I believe this does warrant further evaluation and will contact [facility] cardiology regarding likely admission for that evaluation to be completed.        Lezlie Octave, MD 10/02/11 2306  Discussed with Dr. Melina Copa who will see and evaluate the pt.    Lezlie Octave, MD 10/02/11 [phone]

## 2011-10-02 NOTE — ED Notes (Signed)
Pt reports chest discomfort pressure x 2 days that increased today with SOB all day today  Stent place 1 yr ago Dr Tamala Julian cardologist

## 2011-10-02 NOTE — ED Notes (Signed)
RETURNED FROM X-RAY

## 2011-10-02 NOTE — ED Notes (Signed)
DR. Legrand Como BUTLER ( CARDIOLOGIST) AT BEDSIDE EVALUATING PT.

## 2011-10-03 ENCOUNTER — Encounter (HOSPITAL_COMMUNITY): Payer: Self-pay | Admitting: Internal Medicine

## 2011-10-03 ENCOUNTER — Encounter (HOSPITAL_COMMUNITY): Payer: Medicare Other

## 2011-10-03 ENCOUNTER — Encounter (HOSPITAL_COMMUNITY)
Admission: EM | Disposition: A | Discharge status: Home / Self Care | Payer: Self-pay | Source: Ambulatory Visit | Attending: Interventional Cardiology

## 2011-10-03 DIAGNOSIS — M109 Gout, unspecified: Secondary | ICD-10-CM | POA: Diagnosis present

## 2011-10-03 DIAGNOSIS — Z79899 Other long term (current) drug therapy: Secondary | ICD-10-CM | POA: Diagnosis not present

## 2011-10-03 DIAGNOSIS — Z7902 Long term (current) use of antithrombotics/antiplatelets: Secondary | ICD-10-CM | POA: Diagnosis not present

## 2011-10-03 DIAGNOSIS — E119 Type 2 diabetes mellitus without complications: Secondary | ICD-10-CM | POA: Diagnosis present

## 2011-10-03 DIAGNOSIS — N289 Disorder of kidney and ureter, unspecified: Secondary | ICD-10-CM | POA: Diagnosis present

## 2011-10-03 DIAGNOSIS — I200 Unstable angina: Secondary | ICD-10-CM

## 2011-10-03 DIAGNOSIS — I10 Essential (primary) hypertension: Secondary | ICD-10-CM | POA: Diagnosis present

## 2011-10-03 DIAGNOSIS — J449 Chronic obstructive pulmonary disease, unspecified: Secondary | ICD-10-CM | POA: Diagnosis present

## 2011-10-03 DIAGNOSIS — N400 Enlarged prostate without lower urinary tract symptoms: Secondary | ICD-10-CM | POA: Diagnosis present

## 2011-10-03 DIAGNOSIS — Z7982 Long term (current) use of aspirin: Secondary | ICD-10-CM | POA: Diagnosis not present

## 2011-10-03 DIAGNOSIS — E785 Hyperlipidemia, unspecified: Secondary | ICD-10-CM | POA: Diagnosis present

## 2011-10-03 DIAGNOSIS — R079 Chest pain, unspecified: Secondary | ICD-10-CM | POA: Diagnosis not present

## 2011-10-03 DIAGNOSIS — Z9861 Coronary angioplasty status: Secondary | ICD-10-CM | POA: Diagnosis not present

## 2011-10-03 DIAGNOSIS — Z87891 Personal history of nicotine dependence: Secondary | ICD-10-CM | POA: Diagnosis not present

## 2011-10-03 DIAGNOSIS — I2510 Atherosclerotic heart disease of native coronary artery without angina pectoris: Secondary | ICD-10-CM | POA: Diagnosis not present

## 2011-10-03 DIAGNOSIS — K219 Gastro-esophageal reflux disease without esophagitis: Secondary | ICD-10-CM | POA: Diagnosis present

## 2011-10-03 HISTORY — PX: LEFT HEART CATHETERIZATION WITH CORONARY ANGIOGRAM: SHX5451

## 2011-10-03 LAB — HEPARIN LEVEL (UNFRACTIONATED): Heparin Unfractionated: 0.57 IU/mL (ref 0.30–0.70)

## 2011-10-03 LAB — PRO B NATRIURETIC PEPTIDE: Pro B Natriuretic peptide (BNP): 40.5 pg/mL (ref 0–125)

## 2011-10-03 LAB — LIPID PANEL
LDL Cholesterol: 101 mg/dL — ABNORMAL HIGH (ref 0–99)
VLDL: 38 mg/dL (ref 0–40)

## 2011-10-03 LAB — CARDIAC PANEL(CRET KIN+CKTOT+MB+TROPI)
CK, MB: 3.5 ng/mL (ref 0.3–4.0)
CK, MB: 2.9 ng/mL (ref 0.3–4.0)
Relative Index: INVALID (ref 0.0–2.5)
Total CK: 76 U/L (ref 7–232)
Total CK: 68 U/L (ref 7–232)

## 2011-10-03 LAB — CBC
Hemoglobin: 12.6 g/dL — ABNORMAL LOW (ref 13.0–17.0)
MCH: 29.7 pg (ref 26.0–34.0)
RBC: 4.24 MIL/uL (ref 4.22–5.81)

## 2011-10-03 LAB — HEMOGLOBIN A1C: Hgb A1c MFr Bld: 7.1 % — ABNORMAL HIGH (ref <5.7)

## 2011-10-03 LAB — PROTIME-INR: Prothrombin Time: 13.2 seconds (ref 11.6–15.2)

## 2011-10-03 SURGERY — LEFT HEART CATHETERIZATION WITH CORONARY ANGIOGRAM
Anesthesia: LOCAL

## 2011-10-03 MED ORDER — CLOPIDOGREL BISULFATE 300 MG PO TABS
300.0000 mg | ORAL_TABLET | ORAL | Status: DC
  Filled 2011-10-03: qty 1

## 2011-10-03 MED ORDER — SIMVASTATIN 20 MG PO TABS
20.0000 mg | ORAL_TABLET | Freq: Every day | ORAL | Status: DC
  Administered 2011-10-03: 20 mg via ORAL
  Filled 2011-10-03 (×2): qty 1

## 2011-10-03 MED ORDER — NITROGLYCERIN 0.2 MG/ML ON CALL CATH LAB
INTRAVENOUS | Status: AC
  Filled 2011-10-03: qty 1

## 2011-10-03 MED ORDER — MIDAZOLAM HCL 2 MG/2ML IJ SOLN
INTRAMUSCULAR | Status: AC
  Filled 2011-10-03: qty 2

## 2011-10-03 MED ORDER — GLYBURIDE 2.5 MG PO TABS
2.5000 mg | ORAL_TABLET | Freq: Every day | ORAL | Status: DC
  Administered 2011-10-04: 2.5 mg via ORAL
  Filled 2011-10-03 (×3): qty 1

## 2011-10-03 MED ORDER — ALLOPURINOL 300 MG PO TABS
300.0000 mg | ORAL_TABLET | Freq: Every day | ORAL | Status: DC
  Administered 2011-10-04: 300 mg via ORAL
  Filled 2011-10-03 (×2): qty 1

## 2011-10-03 MED ORDER — PRIMIDONE 50 MG PO TABS
50.0000 mg | ORAL_TABLET | Freq: Two times a day (BID) | ORAL | Status: DC
  Administered 2011-10-03: 50 mg via ORAL
  Filled 2011-10-03 (×4): qty 1

## 2011-10-03 MED ORDER — SODIUM CHLORIDE 0.9 % IJ SOLN: 3.0000 mL | INTRAMUSCULAR | Status: DC | PRN

## 2011-10-03 MED ORDER — GABAPENTIN 400 MG PO CAPS
400.0000 mg | ORAL_CAPSULE | Freq: Two times a day (BID) | ORAL | Status: DC
  Administered 2011-10-03 – 2011-10-04 (×2): 400 mg via ORAL
  Filled 2011-10-03 (×4): qty 1

## 2011-10-03 MED ORDER — SODIUM CHLORIDE 0.9 % IV SOLN: 250.0000 mL | INTRAVENOUS | Status: DC

## 2011-10-03 MED ORDER — SODIUM CHLORIDE 0.9 % IJ SOLN: 3.0000 mL | Freq: Two times a day (BID) | INTRAMUSCULAR | Status: DC

## 2011-10-03 MED ORDER — FERROUS SULFATE 325 (65 FE) MG PO TABS
325.0000 mg | ORAL_TABLET | Freq: Every day | ORAL | Status: DC
  Administered 2011-10-04: 325 mg via ORAL
  Filled 2011-10-03 (×2): qty 1

## 2011-10-03 MED ORDER — ASPIRIN EC 81 MG PO TBEC
81.0000 mg | DELAYED_RELEASE_TABLET | Freq: Every day | ORAL | Status: DC
  Administered 2011-10-04: 81 mg via ORAL
  Filled 2011-10-03: qty 1

## 2011-10-03 MED ORDER — VERAPAMIL HCL 2.5 MG/ML IV SOLN
INTRAVENOUS | Status: AC
  Filled 2011-10-03: qty 2

## 2011-10-03 MED ORDER — TIOTROPIUM BROMIDE MONOHYDRATE 18 MCG IN CAPS
18.0000 ug | ORAL_CAPSULE | Freq: Every day | RESPIRATORY_TRACT | Status: DC
  Filled 2011-10-03 (×2): qty 5

## 2011-10-03 MED ORDER — ONDANSETRON HCL 4 MG/2ML IJ SOLN: 4.0000 mg | Freq: Four times a day (QID) | INTRAMUSCULAR | Status: DC | PRN

## 2011-10-03 MED ORDER — HEPARIN BOLUS VIA INFUSION
4000.0000 [IU] | Freq: Once | INTRAVENOUS | Status: AC
  Administered 2011-10-03: 4000 [IU] via INTRAVENOUS

## 2011-10-03 MED ORDER — LISINOPRIL 2.5 MG PO TABS
2.5000 mg | ORAL_TABLET | Freq: Every day | ORAL | Status: DC
  Filled 2011-10-03: qty 1

## 2011-10-03 MED ORDER — SODIUM CHLORIDE 0.9 % IV SOLN: 250.0000 mL | INTRAVENOUS | Status: DC | PRN

## 2011-10-03 MED ORDER — GEMFIBROZIL 600 MG PO TABS
600.0000 mg | ORAL_TABLET | Freq: Every day | ORAL | Status: DC
  Administered 2011-10-04: 600 mg via ORAL
  Filled 2011-10-03 (×3): qty 1

## 2011-10-03 MED ORDER — CLOPIDOGREL BISULFATE 75 MG PO TABS
75.0000 mg | ORAL_TABLET | Freq: Every day | ORAL | Status: DC
  Administered 2011-10-04: 75 mg via ORAL
  Filled 2011-10-03 (×2): qty 1

## 2011-10-03 MED ORDER — LIDOCAINE HCL (PF) 1 % IJ SOLN
INTRAMUSCULAR | Status: AC
  Filled 2011-10-03: qty 30

## 2011-10-03 MED ORDER — IPRATROPIUM BROMIDE HFA 17 MCG/ACT IN AERS: 2.0000 | INHALATION_SPRAY | RESPIRATORY_TRACT | Status: DC | PRN

## 2011-10-03 MED ORDER — INSULIN ASPART 100 UNIT/ML ~~LOC~~ SOLN
0.0000 [IU] | Freq: Three times a day (TID) | SUBCUTANEOUS | Status: DC
  Administered 2011-10-04: 2 [IU] via SUBCUTANEOUS

## 2011-10-03 MED ORDER — SODIUM CHLORIDE 0.9 % IV SOLN: INTRAVENOUS | Status: DC

## 2011-10-03 MED ORDER — VITAMIN B-12 1000 MCG PO TABS
1000.0000 ug | ORAL_TABLET | Freq: Every day | ORAL | Status: DC
  Administered 2011-10-04: 1000 ug via ORAL
  Filled 2011-10-03 (×2): qty 1

## 2011-10-03 MED ORDER — ISOSORBIDE MONONITRATE ER 60 MG PO TB24
60.0000 mg | ORAL_TABLET | Freq: Every day | ORAL | Status: DC
  Administered 2011-10-03 – 2011-10-04 (×2): 60 mg via ORAL
  Filled 2011-10-03 (×2): qty 1

## 2011-10-03 MED ORDER — FENTANYL CITRATE 0.05 MG/ML IJ SOLN
INTRAMUSCULAR | Status: AC
  Filled 2011-10-03: qty 2

## 2011-10-03 MED ORDER — ACETAMINOPHEN 325 MG PO TABS: 650.0000 mg | ORAL_TABLET | ORAL | Status: DC | PRN

## 2011-10-03 MED ORDER — PANTOPRAZOLE SODIUM 40 MG PO TBEC: 40.0000 mg | DELAYED_RELEASE_TABLET | Freq: Every day | ORAL | Status: DC

## 2011-10-03 MED ORDER — HEPARIN (PORCINE) IN NACL 100-0.45 UNIT/ML-% IJ SOLN
1200.0000 [IU]/h | INTRAMUSCULAR | Status: DC
  Filled 2011-10-03 (×3): qty 250

## 2011-10-03 MED ORDER — FINASTERIDE 5 MG PO TABS
5.0000 mg | ORAL_TABLET | Freq: Every day | ORAL | Status: DC
  Administered 2011-10-04: 5 mg via ORAL
  Filled 2011-10-03 (×2): qty 1

## 2011-10-03 MED ORDER — HEPARIN (PORCINE) IN NACL 2-0.9 UNIT/ML-% IJ SOLN
INTRAMUSCULAR | Status: AC
  Filled 2011-10-03: qty 2000

## 2011-10-03 MED ORDER — ASPIRIN 81 MG PO CHEW
324.0000 mg | CHEWABLE_TABLET | ORAL | Status: DC
  Filled 2011-10-03: qty 4

## 2011-10-03 MED ORDER — SODIUM CHLORIDE 0.9 % IV SOLN
INTRAVENOUS | Status: AC
  Administered 2011-10-03: 16:00:00 via INTRAVENOUS

## 2011-10-03 MED ORDER — METOPROLOL TARTRATE 25 MG PO TABS
25.0000 mg | ORAL_TABLET | Freq: Two times a day (BID) | ORAL | Status: DC
  Filled 2011-10-03 (×2): qty 1

## 2011-10-03 NOTE — ED Notes (Signed)
REPORT GIVEN TO FLOOR NURSE , TRANSPORTED TO FLOOR PAIN FREE / NO DISTRESS / STABLE, RESPIRATIONS UNLABORED , HEPARIN DRIP INFUSING , IV SITE UNREMARKABLE.

## 2011-10-03 NOTE — ED Notes (Signed)
Called pharmacy to send pt's heparin .

## 2011-10-03 NOTE — Progress Notes (Signed)
Patient Name: Marco Acevedo Date of Encounter: 10/03/2011    SUBJECTIVE:3-4 day history of chest tightness and dyspnea. It is precipitated by activity but also can occur at rest. Nitro has been helping and similar to his complaint prior to last cath/stent in 02/2011. He apparently has two stents with both in the circumflex(2007 and 2012).  TELEMETRY:  NSR: Filed Vitals:   10/03/11 0019 10/03/11 0125 10/03/11 0547 10/03/11 0849  BP:  155/83 114/73   Pulse:  76 79 72  Temp:  97.5 F (36.4 C) 98.3 F (36.8 C)   TempSrc:  Oral Oral   Resp:  20 16 18   Height: 5\' 8"  (1.727 m) 5\' 8"  (1.727 m)    Weight: 103.42 kg (228 lb) 104.2 kg (229 lb 11.5 oz)    SpO2:  97% 97%    No intake or output data in the 24 hours ending 10/03/11 0927  LABS: Basic Metabolic Panel:  [facility] 10/02/11 2134  NA 135  K 4.9  CL 101  CO2 24  GLUCOSE 175*  BUN 29*  CREATININE 1.36*  CALCIUM 9.5  MG --  PHOS --   CBC:  Basename 10/03/11 0836 10/02/11 2134  WBC 5.5 5.7  NEUTROABS -- 3.1  HGB 12.6* 12.2*  HCT 36.5* 35.5*  MCV 86.1 86.8  PLT 282 253   Cardiac Enzymes:  Basename 10/03/11 0836 10/03/11 0155 10/02/11 2134  CKTOTAL PENDING 76 86  CKMB 2.8 3.5 3.9  CKMBINDEX -- -- --  TROPONINI <0.30 <0.30 <0.30   ECG: Normal. Radiology/Studies:  CHEST - 2 VIEW  Comparison: 05/11/2011.  Findings: Stable to slightly lower lung volumes. Cardiac size and  mediastinal contours are within normal limits. Visualized tracheal  air column is within normal limits. Stable to mildly increased  retrocardiac opacity which most resembles atelectasis. No  pneumothorax, pulmonary edema, pleural effusion or consolidation.  No acute osseous abnormality identified.  IMPRESSION:  Mildly increased atelectasis, otherwise no acute cardiopulmonary  abnormality.  Original Report Authenticated By: Randall An, M.D.        Physical Exam: Blood pressure 114/73, pulse 72, temperature 98.3 F (36.8 C), temperature  source Oral, resp. rate 18, height 5\' 8"  (1.727 m), weight 104.2 kg (229 lb 11.5 oz), SpO2 97.00%. Weight change:    Decreased breath sounds. No wheezes. No rhonchi. Cardiac exam is unremarkable with reference to murmur  And gallop. There is no edema. Right and left radial pulses are 2+  ASSESSMENT:  1. Unstable angina most likely explanation for presentation but confusing given normal ECG, and h/o COPD.  2. Dyspnea, likely multifactorial and related to COPD and DHF. We have no info about LV function but he has never had MI.   Plan:  1. Diagnostic cath and possible PCI from radial approach. He strongly prefers the arm approach over femoral. 2. Procedure and risk discussed including his increased risk of kidney injury, stroke, and ischemic(limb and cardiac) complications. He also understands possible bleeding, CHF, death, allergy, among other risks.  Demetrios Isaacs 10/03/2011, 9:27 AM

## 2011-10-03 NOTE — Progress Notes (Signed)
Explained this am during shift change that we would have to get an order from the MD for pt to take his own meds, and then, they would have to be sent down to pharmacy and dispensed by staff.  Pt stated he would not take any of his meds until he received an order from MD to do this.  Refused our medications in the meantime.  Pt for cardiac cath and needed asa and plavix.  Refused ours and stated dr. Tamala Julian said he was going to write the orders to use his own meds.  Informed pt and wife that no order was put in and that care manager was coming to discuss with pt. Pt's wife gave pt plavix and asa from home.  Pt stated he could do without the rest of his meds.  Care mgr. Reiterated hospital policy/protocol r/t taking meds from home.  Care mgr to try to find out more about insurance covering meds. And will let us know.  Otherwise will call md back to check on order.

## 2011-10-03 NOTE — Progress Notes (Signed)
ANTICOAGULATION CONSULT NOTE - Initial Consult  Pharmacy Consult for heparin Indication: chest pain/ACS  No Known Allergies  Patient Measurements: Height: 5\' 8"  (172.7 cm) Weight: 228 lb (103.42 kg) IBW/kg (Calculated) : 68.4  Heparin Dosing Weight: 92kg  Vital Signs: Temp: 98.4 F (36.9 C) (06/25 0015) Temp src: Oral (06/25 0015) BP: 145/82 mmHg (06/25 0015) Pulse Rate: 77  (06/24 2240)  Labs:  [facility] 10/02/11 2134  HGB 12.2*  HCT 35.5*  PLT 253  APTT --  LABPROT --  INR --  HEPARINUNFRC --  CREATININE 1.36*  CKTOTAL 86  CKMB 3.9  TROPONINI <0.30    Estimated Creatinine Clearance: 55.5 ml/min (by C-G formula based on Cr of 1.36).   Medical History: Past Medical History  Diagnosis Date  . Diabetes mellitus type II   . Hyperlipidemia   . CAD (coronary artery disease)     no prior MI, PCI x 2 in 2009 and 02/2011 in [place]  . GERD (gastroesophageal reflux disease)   . BPH (benign prostatic hyperplasia)   . Granuloma annulare   . Gout   . Hypertension     Assessment: 77yo male c/o intermittent CP x2 days associated with SOB, initial CE negative, to begin heparin.  Goal of Therapy:  Heparin level 0.3-0.7 units/ml Monitor platelets by anticoagulation protocol: Yes   Plan:  Will give heparin bolus of 4000 units x1 followed by gtt at 1200 units/hr and monitor heparin levels and CBC.  Rogue Bussing PharmD BCPS 10/03/2011,12:22 AM

## 2011-10-03 NOTE — H&P (Signed)
Cardiology H&P  Primary Care Povider: Dimas Aguas, MD Primary Cardiologist: Linard Millers   HPI: Mr. Marco Acevedo is a 77 y.o.male with CAD and prior PCI x 2 in [place] who presents tonight with 3 days of chest pressure.  The patient recently moved to [facility] and established care with Dr. Tamala Julian.  He reports having recently stopped smoking in February and starting cardiopulmonary rehab.  He was doing well until late last week when he began to develop mild exertional SOB and chest pressure that progressed over the weekend.  He has now been having episodes of CP and SOB at rest that are associated with diaphoresis.  He did take nitro once with some relief but also took some maloxx during another episode and thought it helped.  His last PCI was in 02/2011 in [place].  His wife has the stent information for 2009 and 2012 but is not here now.  He is not having orthopnea or PND but has gained a little weight since stopping smoking.  He is currnetly chest pain free.   Past Medical History  Diagnosis Date  . Diabetes mellitus type II   . Hyperlipidemia   . CAD (coronary artery disease)     no prior MI, PCI x 2 in 2009 and 02/2011 in [place]  . GERD (gastroesophageal reflux disease)   . BPH (benign prostatic hyperplasia)   . Granuloma annulare   . Gout   . Hypertension     Past Surgical History  Procedure Date  . None     Family History  Problem Relation Age of Onset  . Aortic aneurysm Father   . Aneurysm Mother     brain  . Cancer Brother   . COPD Sister     Social History:  reports that he quit smoking about 4 months ago. His smoking use included Cigarettes. He has a 64 pack-year smoking history. He has never used smokeless tobacco. He reports that he drinks alcohol. He reports that he does not use illicit drugs.  Allergies: No Known Allergies  Current Facility-Administered Medications  Medication Dose Route Frequency Provider Last Rate Last Dose  . aspirin chewable tablet 324 mg  324 mg  Oral Once Lezlie Octave, MD   324 mg at 10/02/11 2149  . heparin ADULT infusion 100 units/mL (25000 units/250 mL)  1,200 Units/hr Intravenous Continuous Rogue Bussing, PHARMD      . heparin bolus via infusion 4,000 Units  4,000 Units Intravenous Once Rogue Bussing, PHARMD      . nitroGLYCERIN (NITROSTAT) SL tablet 0.4 mg  0.4 mg Sublingual Q5 Min x 3 PRN Lezlie Octave, MD       Current Outpatient Prescriptions  Medication Sig Dispense Refill  . allopurinol (ZYLOPRIM) 300 MG tablet Take 300 mg by mouth daily.      [name] aspirin EC 81 MG tablet Take 81 mg by mouth daily.      . clopidogrel (PLAVIX) 75 MG tablet Take 75 mg by mouth daily.      . Ferrous Sulfate (IRON) 325 (65 FE) MG TABS Take 1 tablet by mouth daily.      . finasteride (PROSCAR) 5 MG tablet Take 5 mg by mouth daily.      . fluticasone (FLONASE) 50 MCG/ACT nasal spray Place 2 sprays into the nose daily.      [name] gabapentin (NEURONTIN) 400 MG capsule Take 400 mg by mouth 2 (two) times daily.       [name] gemfibrozil (LOPID) 600 MG tablet  Take 600 mg by mouth daily.      [name] glyBURIDE (DIABETA) 2.5 MG tablet Take 2.5 mg by mouth daily with breakfast.      . ipratropium (ATROVENT HFA) 17 MCG/ACT inhaler Inhale 2 puffs into the lungs 4 (four) times daily as needed.       [name] lisinopril (PRINIVIL,ZESTRIL) 5 MG tablet Take 2.5 mg by mouth daily.      . metFORMIN (GLUCOPHAGE) 1000 MG tablet Take 1,000 mg by mouth 2 (two) times daily with a meal.       . omeprazole (PRILOSEC) 20 MG capsule Take 40 mg by mouth daily.      . pravastatin (PRAVACHOL) 40 MG tablet Take 40 mg by mouth daily.      . primidone (MYSOLINE) 50 MG tablet Take 50 mg by mouth 2 (two) times daily.      [name] tiotropium (SPIRIVA HANDIHALER) 18 MCG inhalation capsule Place 1 capsule (18 mcg total) into inhaler and inhale daily.  30 capsule  5  . vitamin B-12 (CYANOCOBALAMIN) 1000 MCG tablet Take 1,000 mcg by mouth daily.      [name] guaifenesin (HUMIBID E) 400 MG TABS Take  400 mg by mouth as needed. For cough        ROS: A full review of systems is obtained and is negative except as noted in the HPI.  Physical Exam: Blood pressure 145/82, pulse 77, temperature 98.4 F (36.9 C), temperature source Oral, resp. rate 16, height 5\' 8"  (1.727 m), weight 103.42 kg (228 lb), SpO2 99.00%.  GENERAL: no acute distress. Obese EYES: Extra ocular movements are intact. There is no lid lag. Sclera is anicteric.  ENT: Oropharynx is clear. Dentition is within normal limits.  NECK: Supple. The thyroid is not enlarged.  LYMPH: There are no masses or lymphadenopathy present.  HEART: Regular rate and rhythm with no m/g/r.  Normal S1/S2. JVP 10cm LUNGS: mild basilar crackles, no wheezing.  ABDOMEN: Soft, non-tender, and non-distended with normoactive bowel sounds. There is no hepatosplenomegaly.  EXTREMITIES: No clubbing, cyanosis, or edema.  PULSES: Carotids were +2 and equal bilaterally with no bruits. Femoral pulses were +2 and equal bilaterally. DP/PT pulses were +2 and equal bilaterally. Radial 2 + bilateral SKIN: Warm, dry, and intact.  NEUROLOGIC: The patient was oriented to person, place, and time. No overt neurologic deficits were detected.  PSYCH: Normal judgment and insight, mood is appropriate.   Results: Results for orders placed during the hospital encounter of 10/02/11 (from the past 24 hour(s))  CBC     Status: Abnormal   Collection Time   10/02/11  9:34 PM      Component Value Range   WBC 5.7  4.0 - 10.5 K/uL   RBC 4.09 (*) 4.22 - 5.81 MIL/uL   Hemoglobin 12.2 (*) 13.0 - 17.0 g/dL   HCT 35.5 (*) 39.0 - 52.0 %   MCV 86.8  78.0 - 100.0 fL   MCH 29.8  26.0 - 34.0 pg   MCHC 34.4  30.0 - 36.0 g/dL   RDW 14.1  11.5 - 15.5 %   Platelets 253  150 - 400 K/uL  DIFFERENTIAL     Status: Normal   Collection Time   10/02/11  9:34 PM      Component Value Range   Neutrophils Relative 54  43 - 77 %   Neutro Abs 3.1  1.7 - 7.7 K/uL   Lymphocytes Relative 36  12 - 46 %    Lymphs Abs 2.0  0.7 - 4.0 K/uL   Monocytes Relative 7  3 - 12 %   Monocytes Absolute 0.4  0.1 - 1.0 K/uL   Eosinophils Relative 3  0 - 5 %   Eosinophils Absolute 0.1  0.0 - 0.7 K/uL   Basophils Relative 1  0 - 1 %   Basophils Absolute 0.0  0.0 - 0.1 K/uL  BASIC METABOLIC PANEL     Status: Abnormal   Collection Time   10/02/11  9:34 PM      Component Value Range   Sodium 135  135 - 145 mEq/L   Potassium 4.9  3.5 - 5.1 mEq/L   Chloride 101  96 - 112 mEq/L   CO2 24  19 - 32 mEq/L   Glucose, Bld 175 (*) 70 - 99 mg/dL   BUN 29 (*) 6 - 23 mg/dL   Creatinine, Ser 1.36 (*) 0.50 - 1.35 mg/dL   Calcium 9.5  8.4 - 10.5 mg/dL   GFR calc non Af Amer 50 (*) >90 mL/min   GFR calc Af Amer 58 (*) >90 mL/min  TROPONIN I     Status: Normal   Collection Time   10/02/11  9:34 PM      Component Value Range   Troponin I <0.30  <0.30 ng/mL  CK TOTAL AND CKMB     Status: Normal   Collection Time   10/02/11  9:34 PM      Component Value Range   Total CK 86  7 - 232 U/L   CK, MB 3.9  0.3 - 4.0 ng/mL   Relative Index RELATIVE INDEX IS INVALID  0.0 - 2.5    EKG: NSR without STT changes CXR: clear  Assessment/Plan: 77 yo WM with HTN, DM, and CAD with previous PCI x 2 in 2009/2012 while in [place] here with unstable angina. Currently pain free and no EKG changes 1. Unstable Angina - cycle cardiac markers - ASA 325 given, continue 81mg  daily and home plavix 75mg  daily - start heparin ggt until ruled out - start metoprolol 25 BID - continue ACEI - continue statin, check lipids in AM - likely LHC tomorrow vs functional study if enzymes are negative 2. DM:  -check HA1c -hold metformin - SSI 3. Mild renal insufficiency: unclear baseline GFR in 50s - will hold IVF tonight as he appears mildly volume expanded on exam - follow    Mariadelosang Wynns 10/03/2011, 12:24 AM

## 2011-10-03 NOTE — Care Management Note (Signed)
    Page 1 of 1   10/03/2011     1:33:03 PM   CARE MANAGEMENT NOTE 10/03/2011  Patient:  Marco Acevedo, Marco Acevedo   Account Number:  [id number]  Date Initiated:  10/03/2011  Documentation initiated by:  Luz Lex  Subjective/Objective Assessment:   CP - ACS  Has wife.     Action/Plan:   Anticipated DC Date:  10/06/2011   Anticipated DC Plan:  [employer]  CM consult      Choice offered to / List presented to:             Status of service:  In process, will continue to follow Medicare Important Message given?   (If response is "NO", the following Medicare IM given date fields will be blank) Date Medicare IM given:   Date Additional Medicare IM given:    Discharge Disposition:    Per UR Regulation:  Reviewed for med. necessity/level of care/duration of stay  If discussed at [place] of Stay Meetings, dates discussed:    Comments:  10-03-11 1:15pm Luz Lex, RNBSN Patient has home meds at bedside and is adament he will take his own and does not want the hospital meds because he has already paid for them through the [place].  Has part A&B Medicare, no part D, as [facility] covers meds.  Also has secondary BCBS.  Talked with Pharmacy and we both agree that the Medicare is a lump sum and cover the hospital bill including meds.  Explained to patient and he wants it verified that the meds here will be covered prior to him taking them.  Message left for financial counselor to call back.  Financial counselor Caryl Pina returned call and verified that under Medicare part A meds here in the hospital are covered.  Understands and is sending his home meds home with his wife.

## 2011-10-03 NOTE — Progress Notes (Signed)
UR Completed.  Vergie Living [id number] 10/03/2011

## 2011-10-03 NOTE — H&P (Signed)
  No change from the note earlier this AM and yesterday at admission. He has not had chest pain since this AM. Procedure and risks were discussed in detail.

## 2011-10-03 NOTE — Progress Notes (Signed)
Pt started complaining of chest pressure rated 5 out of 10. BP 109/66, HR 74, O2 sat 96% on 2L. Discomfort located in middle of chest spreading across. Pt given a total of 2 SL Nitro 5 minutes apart. BP now 102/58. HR 80. O2 sat 94%. Pt now denies chest pain. Will continue to monitor.  Vella Raring, RN

## 2011-10-03 NOTE — Progress Notes (Signed)
ANTICOAGULATION CONSULT NOTE - Follow Up Consult  Pharmacy Consult for Heparin Indication: chest pain/ACS  No Known Allergies  Patient Measurements: Height: 5\' 8"  (172.7 cm) Weight: 229 lb 11.5 oz (104.2 kg) IBW/kg (Calculated) : 68.4  Heparin Dosing Weight: 92 kg  Vital Signs: Temp: 98.3 F (36.8 C) (06/25 0547) Temp src: Oral (06/25 0547) BP: 114/73 mmHg (06/25 0547) Pulse Rate: 72  (06/25 0849)  Labs:  Basename 10/03/11 0836 10/03/11 0155 10/02/11 2134  HGB 12.6* -- 12.2*  HCT 36.5* -- 35.5*  PLT 282 -- 253  APTT -- 90* --  LABPROT -- 13.2 --  INR -- 0.98 --  HEPARINUNFRC 0.57 -- --  CREATININE -- -- 1.36*  CKTOTAL 74 76 86  CKMB 2.8 3.5 3.9  TROPONINI <0.30 <0.30 <0.30    Estimated Creatinine Clearance: 55.7 ml/min (by C-G formula based on Cr of 1.36).   Medications:  Scheduled:    . allopurinol  300 mg Oral Daily  . aspirin  324 mg Oral Once  . aspirin  324 mg Oral Pre-Cath  . aspirin EC  81 mg Oral Daily  . clopidogrel  300 mg Oral Pre-Cath  . clopidogrel  75 mg Oral Q breakfast  . ferrous sulfate  325 mg Oral Daily  . finasteride  5 mg Oral Daily  . gabapentin  400 mg Oral BID  . gemfibrozil  600 mg Oral Q breakfast  . glyBURIDE  2.5 mg Oral Q breakfast  . heparin  4,000 Units Intravenous Once  . insulin aspart  0-9 Units Subcutaneous TID WC  . metoprolol tartrate  25 mg Oral BID  . pantoprazole  40 mg Oral Q1200  . primidone  50 mg Oral BID  . simvastatin  20 mg Oral q1800  . sodium chloride  3 mL Intravenous Q12H  . tiotropium  18 mcg Inhalation Daily  . vitamin B-12  1,000 mcg Oral Daily  . DISCONTD: lisinopril  2.5 mg Oral Daily   Infusions:    . sodium chloride    . heparin 1,200 Units/hr (10/03/11 0103)    Assessment: 77 yo M started on heparin last PM for chest pain/pressure and SOB.  Heparin level therapeutic on current rate.  Noted plans for cath today.    Goal of Therapy:  Heparin level 0.3-0.7 units/ml Monitor platelets by  anticoagulation protocol: Yes   Plan:  Continue heparin at 1200 units/hr. Follow-up after cath procedure.  [employer], Pharm.D., BCPS Clinical Pharmacist Pager [phone] 10/03/2011 10:31 AM

## 2011-10-03 NOTE — Progress Notes (Signed)
Pt refused to take morning meds (Plavix, Lopid, and Novolog insulin) because he is concerned about the cost of taking meds provided by the hospital. He would rather have his wife bring his meds from home so that he can take them in the hospital.  Vella Raring, RN

## 2011-10-03 NOTE — CV Procedure (Signed)
     Diagnostic Cardiac Catheterization Report  XZAYVIER LENNERT  77 y.o.  male [birth year]  Procedure Date:10/03/2011 Referring Physician: Melinda Crutch, M.D. Primary Cardiologist: Helyn Numbers, M.D.   PROCEDURE:  Left heart catheterization with selective coronary angiography, left ventriculogram.  INDICATIONS:  The patient presents complaining of dyspnea occasionally occurring spontaneously and associated with chest tightness. Symptoms are similar to prior coronary symptoms. EKGs and cardiac markers of been negative. He is undergoing catheterization to rule out in-stent restenosis.  He had a distal site for circumflex stent placed in 2007 and has 5 stenting of in-stent restenosis with a short Xience V in 2012.  The risks, benefits, and details of the procedure were explained to the patient.  The patient verbalized understanding and wanted to proceed.  Informed written consent was obtained.  PROCEDURE TECHNIQUE:  After Xylocaine anesthesia a 5 French sheath was placed in the right radial artery with a single anterior needle wall stick.   Coronary angiography was done using a 5 [place] A2 multipurpose and 5 French 3.5 cm left Judkins catheter.  Left ventriculography was done using a 5 [place] A2 MP catheter.    CONTRAST:  Total of 90 cc.  COMPLICATIONS:  None.    HEMODYNAMICS:  Aortic pressure was 134/76 mmHg; LV pressure was 137/21 mmHg; LVEDP 21 mm mercury.    ANGIOGRAPHIC DATA:   The left main coronary artery is calcified and contains an eccentric 30-40% distal stenosis before the circumflex LAD bifurcation to.  The left anterior descending artery is proximal calcification giving origin to a large first diagonal. Irregularities with calcification is noted throughout the mid vessel. The distal vessel contains a segmental tubular 50-70% stenosis.  The left circumflex artery is gives one large distal obtuse marginal. The stents in the distal OM are widely patent. There is a region of  50% stenosis proximal to the stent. A segmental region of 50-70% narrowing in the mid vessel. There is an eccentric somewhat hazy 50-70% region that overlaps the bifurcation with the continuation of the circumflex. The ostium of the circumflex contains heavy calcification and 50% narrowing. There is TIMI grade 3 flow throughout the circumflex.. I am somewhat concerned about the possibility of thrombus being present but other views demonstrate calcification. No focal high-grade stenosis is noted.  The right coronary artery is diffuse luminal irregularities proximal mid and distal vessel with eccentric segmental 70-80% mid PDA. The caliber of the PDA is relatively small.  LEFT VENTRICULOGRAM:  Left ventricular angiogram was done in the 30 RAO projection and revealed normal left ventricular wall motion and systolic function with an estimated ejection fraction of 60 %.  LVEDP was 21 mmHg.  IMPRESSIONS:  1. Moderate to moderately severe diffuse circumflex disease. Widely patent distal obtuse marginal stents. Region of concern in the proximal circumflex representing either heavy calcification or possibly thrombus. TIMI grade 3 flow was noted.  2. Moderate mid LAD and PDA disease is noted.  3. Normal left ventricular function   RECOMMENDATION:  1. If the patient continues having chest pain we may consider performing a myocardial perfusion study.  2. Add a long-acting nitrate.  3. Consider pulmonary process as the cause of the patient's dyspnea.[name]

## 2011-10-04 DIAGNOSIS — I2510 Atherosclerotic heart disease of native coronary artery without angina pectoris: Secondary | ICD-10-CM | POA: Diagnosis not present

## 2011-10-04 DIAGNOSIS — I200 Unstable angina: Secondary | ICD-10-CM | POA: Diagnosis not present

## 2011-10-04 LAB — CBC
HCT: 33.6 % — ABNORMAL LOW (ref 39.0–52.0)
Hemoglobin: 11.5 g/dL — ABNORMAL LOW (ref 13.0–17.0)
MCH: 29.8 pg (ref 26.0–34.0)
MCV: 87 fL (ref 78.0–100.0)
Platelets: 251 10*3/uL (ref 150–400)
RBC: 3.86 MIL/uL — ABNORMAL LOW (ref 4.22–5.81)
WBC: 5.5 10*3/uL (ref 4.0–10.5)

## 2011-10-04 LAB — GLUCOSE, CAPILLARY: Glucose-Capillary: 157 mg/dL — ABNORMAL HIGH (ref 70–99)

## 2011-10-04 MED ORDER — METOPROLOL TARTRATE 25 MG PO TABS: 25.0000 mg | ORAL_TABLET | Freq: Two times a day (BID) | ORAL | Status: DC

## 2011-10-04 MED ORDER — ISOSORBIDE MONONITRATE ER 60 MG PO TB24: 60.0000 mg | ORAL_TABLET | Freq: Every day | ORAL | Status: DC

## 2011-10-04 MED ORDER — NITROGLYCERIN 0.4 MG SL SUBL: 0.4000 mg | SUBLINGUAL_TABLET | SUBLINGUAL | Status: DC | PRN

## 2011-10-04 NOTE — Discharge Summary (Addendum)
Patient ID: Marco Acevedo MRN: [id number] DOB/AGE: 1935/01/19 77 y.o.  Admit date: 10/02/2011 Discharge date: 10/04/2011  Primary Discharge Diagnosis: Chest pain consistent with unstable angina. Resolved with med therapy Secondary Discharge Diagnosis : Coronary artery disease  Hypertension  COPD  Diabetes mellitus  Significant Diagnostic Studies:  Coronary angiography  Consults: None  Hospital Course: He presented with dyspnea and chest complaints similar to prior presentations with circumflex disease. He had cath that revealed intermediate stenoses in the CFx , LAD, and PDA. We decided to try med therapy.   Discharge Exam: Blood pressure 125/72, pulse 82, temperature 97.7 F (36.5 C), temperature source Oral, resp. rate 18, height 5\' 8"  (1.727 m), weight 104.6 kg (230 lb 9.6 oz), SpO2 95.00%.    Right radial cath site okay  Labs:   Lab Results  Component Value Date   WBC 5.5 10/04/2011   HGB 11.5* 10/04/2011   HCT 33.6* 10/04/2011   MCV 87.0 10/04/2011   PLT 251 10/04/2011    Lab 10/02/11 2134  NA 135  K 4.9  CL 101  CO2 24  BUN 29*  CREATININE 1.36*  CALCIUM 9.5  PROT --  BILITOT --  ALKPHOS --  ALT --  AST --  GLUCOSE 175*   Lab Results  Component Value Date   CKTOTAL 68 10/03/2011   CKMB 2.9 10/03/2011   TROPONINI <0.30 10/03/2011    Lab Results  Component Value Date   CHOL 187 10/03/2011   Lab Results  Component Value Date   HDL 48 10/03/2011   Lab Results  Component Value Date   LDLCALC 101* 10/03/2011   Lab Results  Component Value Date   TRIG 189* 10/03/2011   Lab Results  Component Value Date   CHOLHDL 3.9 10/03/2011       Radiology: NAD EKG: Normal  FOLLOW UP PLANS AND APPOINTMENTS Discharge Orders    Future Appointments: Provider: Department: Dept Phone: Center:   01/15/2012 10:30 AM Melvenia Needles, NP [facility] [phone] None     Medication List  As of 10/04/2011 10:21 AM   TAKE these medications        allopurinol 300 MG tablet   Commonly known as: ZYLOPRIM   Take 300 mg by mouth daily.      aspirin EC 81 MG tablet   Take 81 mg by mouth daily.      clopidogrel 75 MG tablet   Commonly known as: PLAVIX   Take 75 mg by mouth daily.      finasteride 5 MG tablet   Commonly known as: PROSCAR   Take 5 mg by mouth daily.      fluticasone 50 MCG/ACT nasal spray   Commonly known as: FLONASE   Place 2 sprays into the nose daily.      gabapentin 400 MG capsule   Commonly known as: NEURONTIN   Take 400 mg by mouth 2 (two) times daily.      gemfibrozil 600 MG tablet   Commonly known as: LOPID   Take 600 mg by mouth daily.      glyBURIDE 2.5 MG tablet   Commonly known as: DIABETA   Take 2.5 mg by mouth daily with breakfast.      guaifenesin 400 MG Tabs   Commonly known as: HUMIBID E   Take 400 mg by mouth as needed. For cough      ipratropium 17 MCG/ACT inhaler   Commonly known as: ATROVENT HFA   Inhale 2  puffs into the lungs 4 (four) times daily as needed.      Iron 325 (65 FE) MG Tabs   Take 1 tablet by mouth daily.      isosorbide mononitrate 60 MG 24 hr tablet   Commonly known as: IMDUR   Take 1 tablet (60 mg total) by mouth daily.      lisinopril 5 MG tablet   Commonly known as: PRINIVIL,ZESTRIL   Take 2.5 mg by mouth daily.      metFORMIN 1000 MG tablet   Commonly known as: GLUCOPHAGE   Take 1,000 mg by mouth 2 (two) times daily with a meal.      metoprolol tartrate 25 MG tablet   Commonly known as: LOPRESSOR   Take 1 tablet (25 mg total) by mouth 2 (two) times daily.      nitroGLYCERIN 0.4 MG SL tablet   Commonly known as: NITROSTAT   Place 1 tablet (0.4 mg total) under the tongue every 5 (five) minutes x 3 doses as needed for chest pain.      omeprazole 20 MG capsule   Commonly known as: PRILOSEC   Take 40 mg by mouth daily.      pravastatin 40 MG tablet   Commonly known as: PRAVACHOL   Take 40 mg by mouth daily.      primidone 50 MG tablet   Commonly  known as: MYSOLINE   Take 50 mg by mouth 2 (two) times daily.      tiotropium 18 MCG inhalation capsule   Commonly known as: SPIRIVA   Place 1 capsule (18 mcg total) into inhaler and inhale daily.      vitamin B-12 1000 MCG tablet   Commonly known as: CYANOCOBALAMIN   Take 1,000 mcg by mouth daily.           Follow-up Information    Follow up with Sinclair Grooms, MD on 10/10/2011. (CF 10:30 am)    Contact information:   [address] [place] [phone]          BRING ALL MEDICATIONS WITH YOU TO FOLLOW UP APPOINTMENTS  Time spent with patient to include physician time:20 min Signed: Sinclair Grooms 10/04/2011, 10:21 AM

## 2011-10-05 ENCOUNTER — Encounter (HOSPITAL_COMMUNITY): Payer: Medicare Other

## 2011-10-08 DIAGNOSIS — R079 Chest pain, unspecified: Secondary | ICD-10-CM | POA: Diagnosis not present

## 2011-10-08 DIAGNOSIS — J449 Chronic obstructive pulmonary disease, unspecified: Secondary | ICD-10-CM | POA: Diagnosis not present

## 2011-10-10 ENCOUNTER — Encounter (HOSPITAL_COMMUNITY): Payer: Medicare Other

## 2011-10-10 ENCOUNTER — Encounter: Payer: Self-pay | Admitting: Adult Health

## 2011-10-10 ENCOUNTER — Ambulatory Visit (INDEPENDENT_AMBULATORY_CARE_PROVIDER_SITE_OTHER): Payer: Medicare Other | Admitting: Adult Health

## 2011-10-10 VITALS — BP 128/70 | HR 90 | Temp 97.4°F | Ht 68.0 in | Wt 233.4 lb

## 2011-10-10 DIAGNOSIS — I2510 Atherosclerotic heart disease of native coronary artery without angina pectoris: Secondary | ICD-10-CM | POA: Diagnosis not present

## 2011-10-10 DIAGNOSIS — R0602 Shortness of breath: Secondary | ICD-10-CM | POA: Diagnosis not present

## 2011-10-10 DIAGNOSIS — J449 Chronic obstructive pulmonary disease, unspecified: Secondary | ICD-10-CM | POA: Diagnosis not present

## 2011-10-10 DIAGNOSIS — I10 Essential (primary) hypertension: Secondary | ICD-10-CM | POA: Diagnosis not present

## 2011-10-10 MED ORDER — LEVALBUTEROL HCL 0.63 MG/3ML IN NEBU
0.6300 mg | INHALATION_SOLUTION | Freq: Once | RESPIRATORY_TRACT | Status: AC
  Administered 2011-10-10: 0.63 mg via RESPIRATORY_TRACT

## 2011-10-10 MED ORDER — DOXYCYCLINE HYCLATE 100 MG PO TABS: 100.0000 mg | ORAL_TABLET | Freq: Two times a day (BID) | ORAL | Status: AC

## 2011-10-10 MED ORDER — PREDNISONE 10 MG PO TABS: ORAL_TABLET | ORAL | Status: DC

## 2011-10-10 NOTE — Progress Notes (Signed)
Mr. Marco Acevedo only completed 3 sessions of Pulmonary Rehab.  He did not want to participate in educational sessions and also had other obligations that were priority.   Marco Humbles RN

## 2011-10-10 NOTE — Progress Notes (Signed)
Subjective:    Patient ID: Marco Acevedo, male    DOB: 1934/07/17, 77 y.o.   MRN: [id number]  HPI  76/M,smoker , moved back to [place] from [place] , presents for fu of COPD & sleep related hypoxia. Uses [facility] for meds  usess foradil 2-3/wk, lost nebuliser during move, atrovent HFA as needed  He also has CAD s/p stents x 2 on plavix, Htn on lisinopril & neuropathy  He had 1 episode of bronchitis in the last 6 months, denies wheezing or pedal edema, occasional cough with clear phlegm. He started smoking at age 34, retired from [employer]. He has gained 14 lbs in the last 2 years.  Spirometry showed moderate airway obstruction with FEV1 of 66% FVC of 71% and small airways at 55%. CXR - no infiltrates, hyperinflation +  Wife reports loud snoring & occasional witnessed apneas. He reports excessive daytime somnolence  ESS 3/24  Bedtime is between 10 PM and midnight, sleep latency is 10 minutes, 3-4 nocturnal awakenings, out of bed at 8 AM feeling tired with dryness of mouth and occasional headache. He has gained 14 pounds over the last 2 years.    07/17/11 Quit smoking x 9 wks, gained 10 lbs Used Rx nicotine patches Pt states his breathing has been good. quit smoking in feb. Pt stopped taking spiriva 3 weeks ago bc he felt like he didnt need to take it. Has very little cough w/ occasional brown phlem, occasional wheezing and chest tx. Pt also wants to discuss sleep study results from [facility] showed 25 min desatn < 88%, AHI was 5/h, REm 68 mins with REM related AHI 22/h Significant PLMs were noted, he reports neuropathy & is on iron pills already >>  10/10/2011 Acute OV  Complains of increased SOB, prod cough with small amounts of yellow/green mucus, tightness/pain in chest x3 weeks, worse x5days.  Card Cath 2 weeks ago with intermediate stent stenosis  medical management with Imdur  per cards  Urgent care on 6/30 for increased dyspnea and wheezing >>Neb, solumedrol shot.  Some better but still has  cough and wheeze  Of note on ACE  Lost neb machine , needs new rx for machine  No hemoptysis or fever. No edema .  CXR with no acute process.    Review of Systems Constitutional:   No  weight loss, night sweats,  Fevers, chills,  ++fatigue, or  lassitude.  HEENT:   No headaches,  Difficulty swallowing,  Tooth/dental problems, or  Sore throat,                No sneezing, itching, ear ache,  +nasal congestion, post nasal drip,   CV:  No chest pain,  Orthopnea, PND, swelling in lower extremities, anasarca, dizziness, palpitations, syncope.   GI  No heartburn, indigestion, abdominal pain, nausea, vomiting, diarrhea, change in bowel habits, loss of appetite, bloody stools.   Resp:  ,  No coughing up of blood.    No chest wall deformity  Skin: no rash or lesions.  GU: no dysuria, change in color of urine, no urgency or frequency.  No flank pain, no hematuria   MS:  No joint pain or swelling.  No decreased range of motion.  No back pain.  Psych:  No change in mood or affect. No depression or anxiety.  No memory loss.           Objective:   Physical Exam   Gen. Pleasant, well-nourished, in no distress ENT - no  lesions, no post nasal drip, class 2 airway Neck: No JVD, no thyromegaly, no carotid bruits Lungs: no use of accessory muscles, no dullness to percussion, coarse BS  Cardiovascular: Rhythm regular, heart sounds  normal, no murmurs or gallops, no peripheral edema Musculoskeletal: No deformities, no cyanosis or clubbing        Assessment & Plan:

## 2011-10-10 NOTE — Patient Instructions (Addendum)
Doxycycline 100mg  Twice daily  For 7 days  Mucinex DM Twice daily  As needed  Cough/congestion  Prednisone taper over next week.  Fluids and rest  Restart Spiriva  1 puff daily  Please contact office for sooner follow up if symptoms do not improve or worsen or seek emergency care  follow up Dr. Elsworth Soho  As planned

## 2011-10-11 NOTE — Assessment & Plan Note (Signed)
Flare   Plan;  Doxycycline 100mg  Twice daily  For 7 days  Mucinex DM Twice daily  As needed  Cough/congestion  Prednisone taper over next week.  Fluids and rest  Restart Spiriva  1 puff daily  Please contact office for sooner follow up if symptoms do not improve or worsen or seek emergency care  follow up Dr. Elsworth Soho  As planned

## 2011-10-12 ENCOUNTER — Encounter (HOSPITAL_COMMUNITY): Payer: Medicare Other

## 2011-10-17 ENCOUNTER — Encounter (HOSPITAL_COMMUNITY): Payer: Medicare Other

## 2011-10-19 ENCOUNTER — Encounter (HOSPITAL_COMMUNITY): Payer: Medicare Other

## 2011-10-24 ENCOUNTER — Encounter (HOSPITAL_COMMUNITY): Payer: Medicare Other

## 2011-10-24 DIAGNOSIS — I2510 Atherosclerotic heart disease of native coronary artery without angina pectoris: Secondary | ICD-10-CM | POA: Diagnosis not present

## 2011-10-24 DIAGNOSIS — Z79899 Other long term (current) drug therapy: Secondary | ICD-10-CM | POA: Diagnosis not present

## 2011-10-24 DIAGNOSIS — I10 Essential (primary) hypertension: Secondary | ICD-10-CM | POA: Diagnosis not present

## 2011-10-24 DIAGNOSIS — E1149 Type 2 diabetes mellitus with other diabetic neurological complication: Secondary | ICD-10-CM | POA: Diagnosis not present

## 2011-10-24 DIAGNOSIS — J449 Chronic obstructive pulmonary disease, unspecified: Secondary | ICD-10-CM | POA: Diagnosis not present

## 2011-10-24 DIAGNOSIS — E782 Mixed hyperlipidemia: Secondary | ICD-10-CM | POA: Diagnosis not present

## 2011-10-24 DIAGNOSIS — E1142 Type 2 diabetes mellitus with diabetic polyneuropathy: Secondary | ICD-10-CM | POA: Diagnosis not present

## 2011-10-26 ENCOUNTER — Encounter (HOSPITAL_COMMUNITY): Payer: Medicare Other

## 2011-10-31 ENCOUNTER — Encounter (HOSPITAL_COMMUNITY): Payer: Medicare Other

## 2011-11-02 ENCOUNTER — Encounter (HOSPITAL_COMMUNITY): Payer: Medicare Other

## 2011-11-07 ENCOUNTER — Encounter (HOSPITAL_COMMUNITY): Payer: Medicare Other

## 2011-11-09 ENCOUNTER — Encounter (HOSPITAL_COMMUNITY): Payer: Medicare Other

## 2011-11-14 ENCOUNTER — Encounter (HOSPITAL_COMMUNITY): Payer: Medicare Other

## 2011-11-16 ENCOUNTER — Encounter (HOSPITAL_COMMUNITY): Payer: Medicare Other

## 2011-11-21 ENCOUNTER — Encounter (HOSPITAL_COMMUNITY): Payer: Medicare Other

## 2011-11-23 ENCOUNTER — Encounter (HOSPITAL_COMMUNITY): Payer: Medicare Other

## 2011-11-28 ENCOUNTER — Encounter (HOSPITAL_COMMUNITY): Payer: Medicare Other

## 2011-11-29 ENCOUNTER — Other Ambulatory Visit: Payer: Self-pay | Admitting: Pulmonary Disease

## 2011-11-29 MED ORDER — FINASTERIDE 5 MG PO TABS: 5.0000 mg | ORAL_TABLET | Freq: Every day | ORAL | Status: AC

## 2011-11-30 ENCOUNTER — Encounter (HOSPITAL_COMMUNITY): Payer: Medicare Other

## 2011-12-01 ENCOUNTER — Ambulatory Visit: Payer: Medicare Other | Admitting: Pulmonary Disease

## 2011-12-05 ENCOUNTER — Encounter (HOSPITAL_COMMUNITY): Payer: Medicare Other

## 2011-12-07 ENCOUNTER — Encounter (HOSPITAL_COMMUNITY): Payer: Medicare Other

## 2011-12-12 ENCOUNTER — Encounter (HOSPITAL_COMMUNITY): Payer: Medicare Other

## 2011-12-14 ENCOUNTER — Encounter (HOSPITAL_COMMUNITY): Payer: Medicare Other

## 2011-12-19 ENCOUNTER — Encounter (HOSPITAL_COMMUNITY): Payer: Medicare Other

## 2011-12-21 ENCOUNTER — Encounter (HOSPITAL_COMMUNITY): Payer: Medicare Other

## 2011-12-26 ENCOUNTER — Encounter (HOSPITAL_COMMUNITY): Payer: Medicare Other

## 2011-12-28 ENCOUNTER — Encounter (HOSPITAL_COMMUNITY): Payer: Medicare Other

## 2012-01-01 ENCOUNTER — Ambulatory Visit (INDEPENDENT_AMBULATORY_CARE_PROVIDER_SITE_OTHER): Payer: Medicare Other | Admitting: Pulmonary Disease

## 2012-01-01 ENCOUNTER — Encounter: Payer: Self-pay | Admitting: Pulmonary Disease

## 2012-01-01 VITALS — BP 132/76 | HR 78 | Temp 97.9°F | Ht 68.5 in | Wt 232.4 lb

## 2012-01-01 DIAGNOSIS — G4733 Obstructive sleep apnea (adult) (pediatric): Secondary | ICD-10-CM

## 2012-01-01 DIAGNOSIS — J449 Chronic obstructive pulmonary disease, unspecified: Secondary | ICD-10-CM

## 2012-01-01 NOTE — Patient Instructions (Addendum)
Flu shot is recommended Weight loss program Exercise at your pace

## 2012-01-01 NOTE — Progress Notes (Signed)
  Subjective:    Patient ID: Marco Acevedo, male    DOB: Mar 09, 1935, 77 y.o.   MRN: [id number]  HPI 77/M,smoker , moved back to [place] from [place] , presents for fu of COPD & sleep related hypoxia.  [facility] for meds  usess foradil 2-3/wk, lost nebuliser during move, atrovent HFA as needed  He also has CAD s/p stents x 2 on plavix, Htn on lisinopril & neuropathy  He had 1 episode of bronchitis in the last 6 months, denies wheezing or pedal edema, occasional cough with clear phlegm. He started smoking at age 64, retired from [employer]. He has gained 14 lbs in the last 2 years.  Spirometry showed moderate airway obstruction with FEV1 of 66% FVC of 71% and small airways at 55%.    2/13 PSg showed 25 min desatn < 88%, AHI was 5/h, REm 68 mins with REM related AHI 22/h  Significant PLMs were noted, he reports neuropathy & is on iron pills already   01/01/2012  10/10/2011 Acute OV  Urgent care on 6/30 for increased dyspnea and wheezing >>Neb, solumedrol shot.  >> doxy, pred  Card Cath 6/13 with intermediate stent stenosis medical management with Imdur per cards  Lost 8 lbs Quit smoking 2/13 , gained wt but breathing has unchanged. has occasional cough w/ green-yellow phlem. denies any wheezing, chest tx. has not used his spiriva x1-2 weeks. stated it does not help. would like flu shot 4/13 ONO on RA showed only 5 min desatn < 88%  CAT score 11 Does not need oxygen  Did not want to ct pulm rehab - 'they treat [place] like kids'  Review of Systems neg for any significant sore throat, dysphagia, itching, sneezing, nasal congestion or excess/ purulent secretions, fever, chills, sweats, unintended wt loss, pleuritic or exertional cp, hempoptysis, orthopnea pnd or change in chronic leg swelling. Also denies presyncope, palpitations, heartburn, abdominal pain, nausea, vomiting, diarrhea or change in bowel or urinary habits, dysuria,hematuria, rash, arthralgias, visual complaints, headache, numbness weakness  or ataxia.     Objective:   Physical Exam  Gen. Pleasant, obese, in no distress ENT - no lesions, no post nasal drip Neck: No JVD, no thyromegaly, no carotid bruits Lungs: no use of accessory muscles, no dullness to percussion, decreased without rales or rhonchi  Cardiovascular: Rhythm regular, heart sounds  normal, no murmurs or gallops, no peripheral edema Musculoskeletal: No deformities, no cyanosis or clubbing , no tremors      Assessment & Plan:

## 2012-01-02 ENCOUNTER — Encounter (HOSPITAL_COMMUNITY): Payer: Medicare Other

## 2012-01-02 NOTE — Assessment & Plan Note (Signed)
Recent flare 7/13 Flu shot is recommended Weight loss program Exercise at his pace - he prefers to ct his own program

## 2012-01-02 NOTE — Assessment & Plan Note (Signed)
Revisit issue if he has wt gain

## 2012-01-15 ENCOUNTER — Ambulatory Visit: Payer: Medicare Other | Admitting: Adult Health

## 2012-01-17 DIAGNOSIS — E119 Type 2 diabetes mellitus without complications: Secondary | ICD-10-CM | POA: Diagnosis not present

## 2012-01-17 DIAGNOSIS — E782 Mixed hyperlipidemia: Secondary | ICD-10-CM | POA: Diagnosis not present

## 2012-01-17 DIAGNOSIS — I10 Essential (primary) hypertension: Secondary | ICD-10-CM | POA: Diagnosis not present

## 2012-01-17 DIAGNOSIS — R51 Headache: Secondary | ICD-10-CM | POA: Diagnosis not present

## 2012-01-17 DIAGNOSIS — I2510 Atherosclerotic heart disease of native coronary artery without angina pectoris: Secondary | ICD-10-CM | POA: Diagnosis not present

## 2012-01-30 DIAGNOSIS — H02839 Dermatochalasis of unspecified eye, unspecified eyelid: Secondary | ICD-10-CM | POA: Insufficient documentation

## 2012-01-30 DIAGNOSIS — H11829 Conjunctivochalasis, unspecified eye: Secondary | ICD-10-CM | POA: Insufficient documentation

## 2012-01-31 DIAGNOSIS — D235 Other benign neoplasm of skin of trunk: Secondary | ICD-10-CM | POA: Diagnosis not present

## 2012-01-31 DIAGNOSIS — L570 Actinic keratosis: Secondary | ICD-10-CM | POA: Diagnosis not present

## 2012-02-28 DIAGNOSIS — H11439 Conjunctival hyperemia, unspecified eye: Secondary | ICD-10-CM | POA: Diagnosis not present

## 2012-02-28 DIAGNOSIS — H04129 Dry eye syndrome of unspecified lacrimal gland: Secondary | ICD-10-CM | POA: Diagnosis not present

## 2012-02-28 DIAGNOSIS — H02429 Myogenic ptosis of unspecified eyelid: Secondary | ICD-10-CM | POA: Diagnosis not present

## 2012-02-28 DIAGNOSIS — H01029 Squamous blepharitis unspecified eye, unspecified eyelid: Secondary | ICD-10-CM | POA: Diagnosis not present

## 2012-03-05 ENCOUNTER — Encounter: Payer: Self-pay | Admitting: Adult Health

## 2012-03-05 ENCOUNTER — Ambulatory Visit (INDEPENDENT_AMBULATORY_CARE_PROVIDER_SITE_OTHER): Payer: Medicare Other | Admitting: Adult Health

## 2012-03-05 ENCOUNTER — Telehealth: Payer: Self-pay | Admitting: Adult Health

## 2012-03-05 VITALS — BP 130/74 | HR 79 | Temp 98.7°F | Ht 68.0 in | Wt 232.6 lb

## 2012-03-05 DIAGNOSIS — J069 Acute upper respiratory infection, unspecified: Secondary | ICD-10-CM

## 2012-03-05 DIAGNOSIS — J449 Chronic obstructive pulmonary disease, unspecified: Secondary | ICD-10-CM | POA: Diagnosis not present

## 2012-03-05 MED ORDER — FLUTICASONE PROPIONATE 50 MCG/ACT NA SUSP: 2.0000 | Freq: Two times a day (BID) | NASAL | Status: DC

## 2012-03-05 NOTE — Telephone Encounter (Signed)
i have sent in RX into pharmacy. Nothing further was needed

## 2012-03-05 NOTE — Assessment & Plan Note (Signed)
URI /AR   Plan Zyrtec 10mg  At bedtime  For 1 week then as needed for drainage  Saline nasal rinses  As needed   Restart Flonase 2 puffs Twice daily   Delsym 2 tsp Twice daily  As needed  For cough  Mucinex/ Humibid Twice daily  As needed  Congestion  Fluids and rest  Tylenol As needed   Please contact office for sooner follow up if symptoms do not improve or worsen or seek emergency care

## 2012-03-05 NOTE — Assessment & Plan Note (Signed)
Stable without flare  No changes  

## 2012-03-05 NOTE — Progress Notes (Signed)
Subjective:    Patient ID: Marco Acevedo, male    DOB: 1934-06-18, 77 y.o.   MRN: [id number]  HPI  77/M,smoker , moved back to [place] from [place] , presents for fu of COPD & sleep related hypoxia.  Uses [facility] for meds  usess foradil 2-3/wk, lost nebuliser during move, atrovent HFA as needed  He also has CAD s/p stents x 2 on plavix, Htn on lisinopril & neuropathy  He had 1 episode of bronchitis in the last 6 months, denies wheezing or pedal edema, occasional cough with clear phlegm. He started smoking at age 5, retired from [employer]. He has gained 14 lbs in the last 2 years.  Spirometry showed moderate airway obstruction with FEV1 of 66% FVC of 71% and small airways at 55%.    2/13 PSg showed 25 min desatn < 88%, AHI was 5/h, REm 68 mins with REM related AHI 22/h  Significant PLMs were noted, he reports neuropathy & is on iron pills already   10/10/2011 Acute OV  Urgent care on 6/30 for increased dyspnea and wheezing >>Neb, solumedrol shot.  >> doxy, pred  Card Cath 6/13 with intermediate stent stenosis medical management with Imdur per cards  Lost 8 lbs Quit smoking 2/13 , gained wt but breathing has unchanged. has occasional cough w/ green-yellow phlem. denies any wheezing, chest tx. has not used his spiriva x1-2 weeks. stated it does not help. would like flu shot 4/13 ONO on RA showed only 5 min desatn < 88%  CAT score 11 Does not need oxygen  Did not want to ct pulm rehab - 'they treat [place] like kids'  03/05/2012 Acute OV  Complains of head congestion, sore throat, watery eyes, PND, dry cough x3-4days.  Complains of sinus pressure and drainage/sneezing.  Itching watery eyes.  Drainage is causing a lot of coughing.  Using otc nasal spray with some relief.  No fever, hemoptysis, chest pain  Or edema.  Remains on ACE inhibitor on b/p- denies chronic cough.  Has been doing much better up until last 4 days  Has been playing golf -very happy with his breathing since quitting smoking.    Has lost weight ~5 l/bs- as he has been more active.  Not using Spiriva -does not feel he needs it.  CAT score 9  Today .       Review of Systems Constitutional:   No  weight loss, night sweats,  Fevers, chills, fatigue, or  lassitude.  HEENT:   No headaches,  Difficulty swallowing,  Tooth/dental problems, or  Sore throat,                No sneezing, itching, ear ache,  +nasal congestion, post nasal drip,   CV:  No chest pain,  Orthopnea, PND, swelling in lower extremities, anasarca, dizziness, palpitations, syncope.   GI  No heartburn, indigestion, abdominal pain, nausea, vomiting, diarrhea, change in bowel habits, loss of appetite, bloody stools.   Resp No coughing up of blood.  No change in color of mucus.  No wheezing.  No chest wall deformity  Skin: no rash or lesions.  GU: no dysuria, change in color of urine, no urgency or frequency.  No flank pain, no hematuria   MS:  No joint pain or swelling.  No decreased range of motion.  No back pain.  Psych:  No change in mood or affect. No depression or anxiety.  No memory loss.           Objective:  Physical Exam Gen. Pleasant, obese, in no distress ENT - no lesions, clear nasal drainage , pale mucosa  Neck: No JVD, no thyromegaly, no carotid bruits Lungs: no use of accessory muscles, no dullness to percussion, decreased without rales or rhonchi  Cardiovascular: Rhythm regular, heart sounds  normal, no murmurs or gallops, no peripheral edema Musculoskeletal: No deformities, no cyanosis or clubbing , no tremors      Assessment & Plan:

## 2012-03-05 NOTE — Patient Instructions (Addendum)
Zyrtec 10mg  At bedtime  For 1 week then as needed for drainage  Saline nasal rinses  As needed   Restart Flonase 2 puffs Twice daily   Delsym 2 tsp Twice daily  As needed  For cough  Mucinex/ Humibid Twice daily  As needed  Congestion  Fluids and rest  Tylenol As needed   Please contact office for sooner follow up if symptoms do not improve or worsen or seek emergency care

## 2012-03-18 DIAGNOSIS — H01029 Squamous blepharitis unspecified eye, unspecified eyelid: Secondary | ICD-10-CM | POA: Diagnosis not present

## 2012-03-18 DIAGNOSIS — H04129 Dry eye syndrome of unspecified lacrimal gland: Secondary | ICD-10-CM | POA: Diagnosis not present

## 2012-03-18 DIAGNOSIS — H02429 Myogenic ptosis of unspecified eyelid: Secondary | ICD-10-CM | POA: Diagnosis not present

## 2012-04-25 DIAGNOSIS — Z79899 Other long term (current) drug therapy: Secondary | ICD-10-CM | POA: Diagnosis not present

## 2012-04-25 DIAGNOSIS — E782 Mixed hyperlipidemia: Secondary | ICD-10-CM | POA: Diagnosis not present

## 2012-04-25 DIAGNOSIS — Z125 Encounter for screening for malignant neoplasm of prostate: Secondary | ICD-10-CM | POA: Diagnosis not present

## 2012-04-25 DIAGNOSIS — I10 Essential (primary) hypertension: Secondary | ICD-10-CM | POA: Diagnosis not present

## 2012-04-25 DIAGNOSIS — E1142 Type 2 diabetes mellitus with diabetic polyneuropathy: Secondary | ICD-10-CM | POA: Diagnosis not present

## 2012-04-25 DIAGNOSIS — E1149 Type 2 diabetes mellitus with other diabetic neurological complication: Secondary | ICD-10-CM | POA: Diagnosis not present

## 2012-04-25 DIAGNOSIS — M109 Gout, unspecified: Secondary | ICD-10-CM | POA: Diagnosis not present

## 2012-04-25 DIAGNOSIS — I2510 Atherosclerotic heart disease of native coronary artery without angina pectoris: Secondary | ICD-10-CM | POA: Diagnosis not present

## 2012-05-20 DIAGNOSIS — H02429 Myogenic ptosis of unspecified eyelid: Secondary | ICD-10-CM | POA: Diagnosis not present

## 2012-07-01 ENCOUNTER — Ambulatory Visit: Payer: Medicare Other | Admitting: Adult Health

## 2012-07-01 DIAGNOSIS — M79609 Pain in unspecified limb: Secondary | ICD-10-CM | POA: Diagnosis not present

## 2012-07-29 ENCOUNTER — Ambulatory Visit: Payer: Medicare Other | Admitting: Adult Health

## 2012-08-28 DIAGNOSIS — I2510 Atherosclerotic heart disease of native coronary artery without angina pectoris: Secondary | ICD-10-CM | POA: Diagnosis not present

## 2012-08-28 DIAGNOSIS — I10 Essential (primary) hypertension: Secondary | ICD-10-CM | POA: Diagnosis not present

## 2012-08-28 DIAGNOSIS — J449 Chronic obstructive pulmonary disease, unspecified: Secondary | ICD-10-CM | POA: Diagnosis not present

## 2012-08-28 DIAGNOSIS — E119 Type 2 diabetes mellitus without complications: Secondary | ICD-10-CM | POA: Diagnosis not present

## 2012-08-28 DIAGNOSIS — I209 Angina pectoris, unspecified: Secondary | ICD-10-CM | POA: Diagnosis not present

## 2012-08-31 ENCOUNTER — Inpatient Hospital Stay (HOSPITAL_COMMUNITY)
Admission: EM | Admit: 2012-08-31 | Discharge: 2012-09-01 | DRG: 303 | Disposition: A | Discharge status: Home / Self Care | Payer: Medicare Other | Attending: Interventional Cardiology | Admitting: Interventional Cardiology

## 2012-08-31 ENCOUNTER — Other Ambulatory Visit: Payer: Self-pay

## 2012-08-31 ENCOUNTER — Emergency Department (HOSPITAL_COMMUNITY): Payer: Medicare Other

## 2012-08-31 DIAGNOSIS — Z79899 Other long term (current) drug therapy: Secondary | ICD-10-CM

## 2012-08-31 DIAGNOSIS — R079 Chest pain, unspecified: Secondary | ICD-10-CM | POA: Diagnosis present

## 2012-08-31 DIAGNOSIS — M109 Gout, unspecified: Secondary | ICD-10-CM | POA: Diagnosis present

## 2012-08-31 DIAGNOSIS — K219 Gastro-esophageal reflux disease without esophagitis: Secondary | ICD-10-CM | POA: Diagnosis present

## 2012-08-31 DIAGNOSIS — Z87891 Personal history of nicotine dependence: Secondary | ICD-10-CM | POA: Diagnosis not present

## 2012-08-31 DIAGNOSIS — G4733 Obstructive sleep apnea (adult) (pediatric): Secondary | ICD-10-CM | POA: Diagnosis not present

## 2012-08-31 DIAGNOSIS — E669 Obesity, unspecified: Secondary | ICD-10-CM | POA: Diagnosis not present

## 2012-08-31 DIAGNOSIS — R072 Precordial pain: Secondary | ICD-10-CM | POA: Diagnosis not present

## 2012-08-31 DIAGNOSIS — N400 Enlarged prostate without lower urinary tract symptoms: Secondary | ICD-10-CM | POA: Diagnosis present

## 2012-08-31 DIAGNOSIS — I2510 Atherosclerotic heart disease of native coronary artery without angina pectoris: Principal | ICD-10-CM | POA: Diagnosis present

## 2012-08-31 DIAGNOSIS — E785 Hyperlipidemia, unspecified: Secondary | ICD-10-CM | POA: Diagnosis not present

## 2012-08-31 DIAGNOSIS — Z7902 Long term (current) use of antithrombotics/antiplatelets: Secondary | ICD-10-CM | POA: Diagnosis not present

## 2012-08-31 DIAGNOSIS — I200 Unstable angina: Secondary | ICD-10-CM

## 2012-08-31 DIAGNOSIS — J4489 Other specified chronic obstructive pulmonary disease: Secondary | ICD-10-CM | POA: Diagnosis present

## 2012-08-31 DIAGNOSIS — J449 Chronic obstructive pulmonary disease, unspecified: Secondary | ICD-10-CM | POA: Diagnosis not present

## 2012-08-31 DIAGNOSIS — Z7982 Long term (current) use of aspirin: Secondary | ICD-10-CM

## 2012-08-31 DIAGNOSIS — E7800 Pure hypercholesterolemia, unspecified: Secondary | ICD-10-CM | POA: Diagnosis not present

## 2012-08-31 DIAGNOSIS — Z6834 Body mass index (BMI) 34.0-34.9, adult: Secondary | ICD-10-CM

## 2012-08-31 DIAGNOSIS — E119 Type 2 diabetes mellitus without complications: Secondary | ICD-10-CM | POA: Diagnosis not present

## 2012-08-31 DIAGNOSIS — I10 Essential (primary) hypertension: Secondary | ICD-10-CM | POA: Diagnosis present

## 2012-08-31 LAB — POCT I-STAT, CHEM 8
BUN: 24 mg/dL — ABNORMAL HIGH (ref 6–23)
Calcium, Ion: 1.23 mmol/L (ref 1.13–1.30)
Chloride: 104 mEq/L (ref 96–112)
HCT: 58 % — ABNORMAL HIGH (ref 39.0–52.0)
Potassium: 4.8 mEq/L (ref 3.5–5.1)

## 2012-08-31 LAB — CBC WITH DIFFERENTIAL/PLATELET
Hemoglobin: 12.2 g/dL — ABNORMAL LOW (ref 13.0–17.0)
Lymphocytes Relative: 38 % (ref 12–46)
Lymphs Abs: 2.3 10*3/uL (ref 0.7–4.0)
Monocytes Relative: 8 % (ref 3–12)
Neutro Abs: 2.8 10*3/uL (ref 1.7–7.7)
Neutrophils Relative %: 46 % (ref 43–77)
RBC: 4.1 MIL/uL — ABNORMAL LOW (ref 4.22–5.81)
WBC: 6 10*3/uL (ref 4.0–10.5)

## 2012-08-31 LAB — COMPREHENSIVE METABOLIC PANEL
ALT: 12 U/L (ref 0–53)
Alkaline Phosphatase: 93 U/L (ref 39–117)
BUN: 23 mg/dL (ref 6–23)
Chloride: 101 mEq/L (ref 96–112)
GFR calc Af Amer: 45 mL/min — ABNORMAL LOW (ref >90)
Glucose, Bld: 173 mg/dL — ABNORMAL HIGH (ref 70–99)
Potassium: 4.4 mEq/L (ref 3.5–5.1)
Sodium: 137 mEq/L (ref 135–145)
Total Bilirubin: 0.4 mg/dL (ref 0.3–1.2)

## 2012-08-31 LAB — POCT I-STAT TROPONIN I

## 2012-08-31 LAB — PRO B NATRIURETIC PEPTIDE: Pro B Natriuretic peptide (BNP): 76.5 pg/mL (ref 0–450)

## 2012-08-31 MED ORDER — HEPARIN BOLUS VIA INFUSION
4000.0000 [IU] | Freq: Once | INTRAVENOUS | Status: AC
  Administered 2012-08-31: 4000 [IU] via INTRAVENOUS

## 2012-08-31 MED ORDER — MORPHINE SULFATE 4 MG/ML IJ SOLN
4.0000 mg | Freq: Once | INTRAMUSCULAR | Status: AC
  Administered 2012-08-31: 4 mg via INTRAVENOUS
  Filled 2012-08-31: qty 1

## 2012-08-31 MED ORDER — HEPARIN (PORCINE) IN NACL 100-0.45 UNIT/ML-% IJ SOLN
1400.0000 [IU]/h | INTRAMUSCULAR | Status: DC
  Administered 2012-08-31: 1400 [IU]/h via INTRAVENOUS
  Filled 2012-08-31 (×3): qty 250

## 2012-08-31 NOTE — ED Notes (Signed)
Pt co constant left side CP, with diaphoresis, nausea. Denies vomiting. Pain exacerbated by exertion, and relived by nitro SL. Pt has had Nitro SL x 5 today. ASA 81 mg this morning.

## 2012-08-31 NOTE — ED Provider Notes (Addendum)
History     CSN: [id number]  Arrival date & time 08/31/12  2205   First MD Initiated Contact with Patient 08/31/12 2227      Chief Complaint  Patient presents with  . Chest Pain    (Consider location/radiation/quality/duration/timing/severity/associated sxs/prior treatment) Patient is a 78 y.o. male presenting with chest pain. The history is provided by the patient. No language interpreter was used.  Chest Pain Pain location:  Substernal area Pain quality: aching, pressure and tightness   Pain radiates to:  L arm Pain radiates to the back: no   Pain severity:  Moderate Onset quality:  Gradual Duration:  4 hours Timing:  Sporadic Progression:  Waxing and waning Chronicity:  Recurrent Context: at rest   Ineffective treatments:  Nitroglycerin Associated symptoms: diaphoresis, nausea and shortness of breath   Associated symptoms: not vomiting   Patient has been having intermittent chest pressure for the last several weeks.  Evaluated by his cardiologist this week, who increased his dose of imdur with good results in reducing chest pressure.  Patient reports additional episodes of chest pressure today that initially responded to nitroglycerin, but developed actual pain that radiated to arms starting around 1800 this evening.  Pain has persisted despite nitroglycerine use.  Past Medical History  Diagnosis Date  . Diabetes mellitus type II   . Hyperlipidemia   . CAD (coronary artery disease)     no prior MI, PCI x 2 in 2009 and 02/2011 in [place]  . GERD (gastroesophageal reflux disease)   . BPH (benign prostatic hyperplasia)   . Granuloma annulare   . Gout   . Hypertension     Past Surgical History  Procedure Laterality Date  . None      Family History  Problem Relation Age of Onset  . Aortic aneurysm Father   . Aneurysm Mother     brain  . Cancer Brother   . COPD Sister     History  Substance Use Topics  . Smoking status: Former Smoker -- 1.00 packs/day for 64  years    Types: Cigarettes    Quit date: 05/29/2011  . Smokeless tobacco: Never Used  . Alcohol Use: Yes     Comment: rare      Review of Systems  Constitutional: Positive for diaphoresis.  Respiratory: Positive for shortness of breath.   Cardiovascular: Positive for chest pain.  Gastrointestinal: Positive for nausea. Negative for vomiting.  All other systems reviewed and are negative.    Allergies  Review of patient's allergies indicates no known allergies.  Home Medications   Current Outpatient Rx  Name  Route  Sig  Dispense  Refill  . albuterol (PROVENTIL HFA) 108 (90 BASE) MCG/ACT inhaler   Inhalation   Inhale 2 puffs into the lungs every 6 (six) hours as needed.         [name] albuterol (PROVENTIL) (2.5 MG/3ML) 0.083% nebulizer solution   Nebulization   Take 2.5 mg by nebulization every 6 (six) hours as needed.         [name] allopurinol (ZYLOPRIM) 300 MG tablet   Oral   Take 300 mg by mouth daily.         [name] aspirin EC 81 MG tablet   Oral   Take 81 mg by mouth daily.         . clopidogrel (PLAVIX) 75 MG tablet   Oral   Take 75 mg by mouth daily.         . Ferrous Sulfate (IRON)  325 (65 FE) MG TABS   Oral   Take 1 tablet by mouth daily.         . finasteride (PROSCAR) 5 MG tablet   Oral   Take 1 tablet (5 mg total) by mouth daily.   30 tablet   5   . fluticasone (FLONASE) 50 MCG/ACT nasal spray   Nasal   Place 2 sprays into the nose 2 (two) times daily.   16 g   5   . gabapentin (NEURONTIN) 300 MG capsule   Oral   Take 300 mg by mouth 2 (two) times daily.         [name] gemfibrozil (LOPID) 600 MG tablet   Oral   Take 600 mg by mouth daily.         [name] glyBURIDE (DIABETA) 2.5 MG tablet   Oral   Take 2.5 mg by mouth daily with breakfast.         . guaifenesin (HUMIBID E) 400 MG TABS   Oral   Take 400 mg by mouth daily as needed (for cough). For cough         . isosorbide mononitrate (IMDUR) 60 MG 24 hr tablet   Oral   Take 120 mg by  mouth daily.         [name] lisinopril (PRINIVIL,ZESTRIL) 10 MG tablet   Oral   Take 10 mg by mouth daily.         . metFORMIN (GLUCOPHAGE) 1000 MG tablet   Oral   Take 1,000 mg by mouth 2 (two) times daily with a meal.          . nitroGLYCERIN (NITROSTAT) 0.4 MG SL tablet   Sublingual   Place 1 tablet (0.4 mg total) under the tongue every 5 (five) minutes x 3 doses as needed for chest pain.   25 tablet   3   . omeprazole (PRILOSEC) 20 MG capsule   Oral   Take 20 mg by mouth 2 (two) times daily before a meal.          . pravastatin (PRAVACHOL) 40 MG tablet   Oral   Take 40 mg by mouth daily.         . primidone (MYSOLINE) 50 MG tablet   Oral   Take 50 mg by mouth daily.          [name] tiotropium (SPIRIVA) 18 MCG inhalation capsule   Inhalation   Place 18 mcg into inhaler and inhale daily.         . vitamin B-12 (CYANOCOBALAMIN) 1000 MCG tablet   Oral   Take 1,000 mcg by mouth daily.           BP 160/62  Pulse 84  Temp(Src) 98.1 F (36.7 C) (Oral)  Resp 15  SpO2 100%  Physical Exam  Nursing note and vitals reviewed. Constitutional: He is oriented to person, place, and time. He appears well-developed and well-nourished.  HENT:  Head: Normocephalic.  Eyes: Pupils are equal, round, and reactive to light.  Neck: Normal range of motion. Neck supple.  Cardiovascular: Normal rate, regular rhythm, normal heart sounds and intact distal pulses.   Pulmonary/Chest: He is in respiratory distress. He has no wheezes. He has no rales. He exhibits no tenderness.  Abdominal: Soft. Bowel sounds are normal.  Musculoskeletal: Normal range of motion. He exhibits no edema and no tenderness.  Neurological: He is alert and oriented to person, place, and time.  Skin: Skin is warm and dry.  Psychiatric: He has a normal mood and affect. His behavior is normal. Judgment and thought content normal.    ED Course  Procedures (including critical care time)   Date: 08/31/2012   Rate: 92  Rhythm: normal sinus rhythm  QRS Axis: normal  Intervals: normal  ST/T Wave abnormalities: normal  Conduction Disutrbances:none  Narrative Interpretation: NSR  Old EKG Reviewed: unchanged    Labs Reviewed  CBC WITH DIFFERENTIAL  COMPREHENSIVE METABOLIC PANEL  PRO B NATRIURETIC PEPTIDE   No results found.   No diagnosis found.  Pain improved after morphine.  EKG without ischemic changes.  Initial troponin normal.  History of 3 vessel disease.  Exacerbation of pain today that was not responsive to sl ntg.  Discussed with Dr. Kathrynn Humble and with cardiology.  Cardiology Oleta Mouse) will see.   MDM          Norman Herrlich, NP 09/01/12 0121  Shared service with midlevel provider. I have personally seen and examined the patient, providing direct face to face care, presenting with the chief complaint of chest pain. Pt's chest pain is typical for his angina, usually responsive to imdur - not today. He has known 3 vessel dz, being managed medically. Concerns for Unstable angina. Hemodynamically stable. Nitro SL didn't help. Has RCA dz, so no plans on giving more nitro. ASA given. Plan will be admit to Cards. I have reviewed the nursing documentation on past medical history, family history, and social history.   Varney Biles, MD 09/01/12 1541  CRITICAL CARE Performed by: Varney Biles   Total critical care time: 30 minutes  Critical care time was exclusive of separately billable procedures and treating other patients.  Critical care was necessary to treat or prevent imminent or life-threatening deterioration.  Critical care was time spent personally by me on the following activities: development of treatment plan with patient and/or surrogate as well as nursing, discussions with consultants, evaluation of patient's response to treatment, examination of patient, obtaining history from patient or surrogate, ordering and performing treatments and interventions, ordering  and review of laboratory studies, ordering and review of radiographic studies, pulse oximetry and re-evaluation of patient's condition.   Varney Biles, MD 09/01/12 [phone]

## 2012-08-31 NOTE — Progress Notes (Signed)
ANTICOAGULATION CONSULT NOTE - Initial Consult  Pharmacy Consult for heparin Indication: chest pain/ACS  No Known Allergies  Patient Measurements: Height: 5\' 8"  (172.7 cm) Weight: 223 lb (101.152 kg) IBW/kg (Calculated) : 68.4 Heparin Dosing Weight: 96 kg  Vital Signs: Temp: 98.1 F (36.7 C) (05/24 2215) Temp src: Oral (05/24 2215) BP: 160/62 mmHg (05/24 2241) Pulse Rate: 84 (05/24 2241)  Labs:  Recent Labs  08/31/12 2237  HGB 12.2*  HCT 34.8*  PLT 256    Estimated Creatinine Clearance: 54.1 ml/min (by C-G formula based on Cr of 1.36).   Medical History: Past Medical History  Diagnosis Date  . Diabetes mellitus type II   . Hyperlipidemia   . CAD (coronary artery disease)     no prior MI, PCI x 2 in 2009 and 02/2011 in [place]  . GERD (gastroesophageal reflux disease)   . BPH (benign prostatic hyperplasia)   . Granuloma annulare   . Gout   . Hypertension     Medications:   (Not in a hospital admission)  Assessment: 78 yo man to start heparin for CP. Goal of Therapy:  Heparin level 0.3-0.7 units/ml Monitor platelets by anticoagulation protocol: Yes   Plan:  Heparin bolus 4000 units and drip at 1400 units/hr Check heparin level 8 hours after start.  Excell Seltzer Poteet 08/31/2012,11:16 PM

## 2012-08-31 NOTE — ED Notes (Signed)
Pt states he has had midsternal Chest pressure for one week.  Saw Dr Tamala Julian in the office  3 days ago and spoke to him on phone one day ago.  Last pm gegan having chest pain and took nitro with relief.  Today he took 5 NTG for  Chest Pain today without relief rating Cpain 4/10 and chest pressure 7/10.  Pain has been recurrent throughout the day.  THis PM chest pain radiated to left chest and left UE not relieved by NTG.  Present CP is 3/10 left chest pain with pressure 4-5/10.  Pain associated with nausea and SOB, diaphoresis.  Denies vomiting, fever, chills.

## 2012-09-01 ENCOUNTER — Encounter (HOSPITAL_COMMUNITY): Payer: Self-pay | Admitting: Cardiology

## 2012-09-01 DIAGNOSIS — E7800 Pure hypercholesterolemia, unspecified: Secondary | ICD-10-CM | POA: Diagnosis present

## 2012-09-01 DIAGNOSIS — E669 Obesity, unspecified: Secondary | ICD-10-CM | POA: Diagnosis present

## 2012-09-01 DIAGNOSIS — R079 Chest pain, unspecified: Secondary | ICD-10-CM | POA: Diagnosis present

## 2012-09-01 LAB — GLUCOSE, CAPILLARY
Glucose-Capillary: 137 mg/dL — ABNORMAL HIGH (ref 70–99)
Glucose-Capillary: 123 mg/dL — ABNORMAL HIGH (ref 70–99)

## 2012-09-01 LAB — HEPARIN LEVEL (UNFRACTIONATED): Heparin Unfractionated: 0.4 IU/mL (ref 0.30–0.70)

## 2012-09-01 LAB — MRSA PCR SCREENING: MRSA by PCR: NEGATIVE

## 2012-09-01 LAB — BASIC METABOLIC PANEL
BUN: 26 mg/dL — ABNORMAL HIGH (ref 6–23)
Chloride: 101 mEq/L (ref 96–112)
Creatinine, Ser: 1.84 mg/dL — ABNORMAL HIGH (ref 0.50–1.35)
GFR calc Af Amer: 40 mL/min — ABNORMAL LOW (ref >90)
GFR calc non Af Amer: 34 mL/min — ABNORMAL LOW (ref >90)

## 2012-09-01 LAB — TROPONIN I: Troponin I: <0.3 ng/mL (ref <0.30)

## 2012-09-01 MED ORDER — METOPROLOL TARTRATE 25 MG PO TABS: 25.0000 mg | ORAL_TABLET | Freq: Two times a day (BID) | ORAL | Status: DC

## 2012-09-01 MED ORDER — ONDANSETRON HCL 4 MG/2ML IJ SOLN: 4.0000 mg | Freq: Four times a day (QID) | INTRAMUSCULAR | Status: DC | PRN

## 2012-09-01 MED ORDER — ASPIRIN EC 81 MG PO TBEC
81.0000 mg | DELAYED_RELEASE_TABLET | Freq: Every day | ORAL | Status: DC
  Administered 2012-09-01: 81 mg via ORAL
  Filled 2012-09-01: qty 1

## 2012-09-01 MED ORDER — IRON 325 (65 FE) MG PO TABS: 1.0000 | ORAL_TABLET | Freq: Every day | ORAL | Status: DC

## 2012-09-01 MED ORDER — LISINOPRIL 10 MG PO TABS
10.0000 mg | ORAL_TABLET | Freq: Every day | ORAL | Status: DC
  Administered 2012-09-01: 10 mg via ORAL
  Filled 2012-09-01: qty 1

## 2012-09-01 MED ORDER — GABAPENTIN 300 MG PO CAPS
300.0000 mg | ORAL_CAPSULE | Freq: Two times a day (BID) | ORAL | Status: DC
  Administered 2012-09-01 (×2): 300 mg via ORAL
  Filled 2012-09-01 (×3): qty 1

## 2012-09-01 MED ORDER — GEMFIBROZIL 600 MG PO TABS
600.0000 mg | ORAL_TABLET | Freq: Every day | ORAL | Status: DC
  Administered 2012-09-01: 600 mg via ORAL
  Filled 2012-09-01: qty 1

## 2012-09-01 MED ORDER — SODIUM CHLORIDE 0.9 % IV SOLN
INTRAVENOUS | Status: AC
  Administered 2012-09-01: 03:00:00 via INTRAVENOUS

## 2012-09-01 MED ORDER — NITROGLYCERIN 2 % TD OINT
1.0000 [in_us] | TOPICAL_OINTMENT | Freq: Four times a day (QID) | TRANSDERMAL | Status: DC
  Administered 2012-09-01 (×2): 1 [in_us] via TOPICAL
  Filled 2012-09-01: qty 30
  Filled 2012-09-01: qty 1

## 2012-09-01 MED ORDER — GLYBURIDE 2.5 MG PO TABS
2.5000 mg | ORAL_TABLET | Freq: Every day | ORAL | Status: DC
  Administered 2012-09-01: 2.5 mg via ORAL
  Filled 2012-09-01 (×2): qty 1

## 2012-09-01 MED ORDER — VITAMIN B-12 1000 MCG PO TABS
1000.0000 ug | ORAL_TABLET | Freq: Every day | ORAL | Status: DC
  Administered 2012-09-01: 1000 ug via ORAL
  Filled 2012-09-01: qty 1

## 2012-09-01 MED ORDER — FINASTERIDE 5 MG PO TABS
5.0000 mg | ORAL_TABLET | Freq: Every day | ORAL | Status: DC
  Administered 2012-09-01: 5 mg via ORAL
  Filled 2012-09-01: qty 1

## 2012-09-01 MED ORDER — ISOSORBIDE MONONITRATE ER 60 MG PO TB24: 120.0000 mg | ORAL_TABLET | Freq: Every day | ORAL | Status: DC

## 2012-09-01 MED ORDER — ALLOPURINOL 300 MG PO TABS
300.0000 mg | ORAL_TABLET | Freq: Every day | ORAL | Status: DC
  Administered 2012-09-01: 300 mg via ORAL
  Filled 2012-09-01: qty 1

## 2012-09-01 MED ORDER — ATORVASTATIN CALCIUM 20 MG PO TABS
20.0000 mg | ORAL_TABLET | Freq: Every day | ORAL | Status: DC
  Filled 2012-09-01: qty 1

## 2012-09-01 MED ORDER — TIOTROPIUM BROMIDE MONOHYDRATE 18 MCG IN CAPS
18.0000 ug | ORAL_CAPSULE | Freq: Every day | RESPIRATORY_TRACT | Status: DC
  Administered 2012-09-01: 18 ug via RESPIRATORY_TRACT
  Filled 2012-09-01: qty 5

## 2012-09-01 MED ORDER — ACETAMINOPHEN 325 MG PO TABS: 650.0000 mg | ORAL_TABLET | ORAL | Status: DC | PRN

## 2012-09-01 MED ORDER — PRIMIDONE 50 MG PO TABS
50.0000 mg | ORAL_TABLET | Freq: Every day | ORAL | Status: DC
  Administered 2012-09-01: 50 mg via ORAL
  Filled 2012-09-01: qty 1

## 2012-09-01 MED ORDER — FERROUS SULFATE 325 (65 FE) MG PO TABS
325.0000 mg | ORAL_TABLET | Freq: Every day | ORAL | Status: DC
  Administered 2012-09-01: 325 mg via ORAL
  Filled 2012-09-01 (×2): qty 1

## 2012-09-01 MED ORDER — ALPRAZOLAM 0.25 MG PO TABS: 0.2500 mg | ORAL_TABLET | Freq: Two times a day (BID) | ORAL | Status: DC | PRN

## 2012-09-01 MED ORDER — ALBUTEROL SULFATE (5 MG/ML) 0.5% IN NEBU: 2.5000 mg | INHALATION_SOLUTION | RESPIRATORY_TRACT | Status: DC | PRN

## 2012-09-01 MED ORDER — CLOPIDOGREL BISULFATE 75 MG PO TABS
75.0000 mg | ORAL_TABLET | Freq: Every day | ORAL | Status: DC
Start: 2012-09-01 — End: 2012-09-01
  Administered 2012-09-01: 75 mg via ORAL
  Filled 2012-09-01: qty 1

## 2012-09-01 MED ORDER — ONDANSETRON HCL 4 MG/2ML IJ SOLN
4.0000 mg | Freq: Once | INTRAMUSCULAR | Status: AC
  Administered 2012-09-01: 4 mg via INTRAVENOUS
  Filled 2012-09-01: qty 2

## 2012-09-01 MED ORDER — SIMVASTATIN 40 MG PO TABS: 40.0000 mg | ORAL_TABLET | Freq: Every day | ORAL | Status: DC

## 2012-09-01 MED ORDER — METOPROLOL TARTRATE 25 MG PO TABS
25.0000 mg | ORAL_TABLET | Freq: Two times a day (BID) | ORAL | Status: DC
  Administered 2012-09-01 (×2): 25 mg via ORAL
  Filled 2012-09-01 (×2): qty 1

## 2012-09-01 NOTE — Progress Notes (Signed)
ANTICOAGULATION CONSULT NOTE - Initial Consult  Pharmacy Consult for heparin Indication: chest pain/ACS  No Known Allergies  Patient Measurements: Height: 5\' 8"  (172.7 cm) Weight: 223 lb 15.8 oz (101.6 kg) IBW/kg (Calculated) : 68.4 Heparin Dosing Weight: 96 kg  Vital Signs: Temp: 97.8 F (36.6 C) (05/25 0745) Temp src: Oral (05/25 0745) BP: 117/58 mmHg (05/25 0800) Pulse Rate: 60 (05/25 0800)  Labs:  Recent Labs  08/31/12 2237 08/31/12 2313 08/31/12 2351 09/01/12 0150 09/01/12 0830  HGB 12.2*  --  19.7*  --   --   HCT 34.8*  --  58.0*  --   --   PLT 256  --   --   --   --   LABPROT  --  12.5  --   --   --   INR  --  0.94  --   --   --   HEPARINUNFRC  --   --   --   --  0.40  CREATININE 1.66*  --  1.50*  --  1.84*  TROPONINI  --   --   --  <0.30 <0.30    Estimated Creatinine Clearance: 40.1 ml/min (by C-G formula based on Cr of 1.84).   Assessment: 78 yo man to start heparin for CP.  First heparin level is therapeutic at 0.40  Goal of Therapy:  Heparin level 0.3-0.7 units/ml Monitor platelets by anticoagulation protocol: Yes   Plan:  Continue heparin drip at 1400 units/hr.  Patient has a discharge order but if he remains hospitalized will check a heparin level and CBC daily while on heparin.  Gerard, Suddith 09/01/2012,11:08 AM

## 2012-09-01 NOTE — Discharge Summary (Signed)
Patient ID: Marco Acevedo MRN: [id number] DOB/AGE: 76/26/1936 76 y.o.  Admit date: 08/31/2012 Discharge date: 09/01/2012  Primary Discharge Diagnosis: [place]/ Chest pain - trop neg  Secondary Discharge Diagnosis: CAD, angina, HTN, HL, obesity,OSA, COPD  Significant Diagnostic Studies: ECG - am of DC - no ST changes, NSR.    Hospital Course: 78 year old with chronic stable angina, known CAD (prior cath reviewed) with multiple comorbidities. He had seen Dr. Tamala Julian last Wed, increased Imdur to 120. Felt great until Friday. Was preparing food for guests and had central chest pressure. Took NTG with some relief. Woke up the next day with same pressure like sensation. Also had while sitting at computer. He decided to come into ER for evaluation after he took a total of 4 NTG. Spoke to [facility] Friday.   He currently has no pain, ambulating well. Eager to go home because of guests coming up from [place]. We discussed his medications at length.  With normal troponin, CP free, normal ECG, I am comfortable with discharge. I have added low dose metoprolol 25mg  PO BID as antianginal.    CATH: 10/02/11 Diagnostic Cardiac Catheterization Report  GAVEN RISKO  78 y.o.  male  [birth year]  Procedure Date:10/03/2011  Referring Physician: Melinda Crutch, M.D.  Primary Cardiologist: Helyn Numbers, M.D.  PROCEDURE: Left heart catheterization with selective coronary angiography, left ventriculogram.  INDICATIONS: The patient presents complaining of dyspnea occasionally occurring spontaneously and associated with chest tightness. Symptoms are similar to prior coronary symptoms. EKGs and cardiac markers of been negative. He is undergoing catheterization to rule out in-stent restenosis.  He had a distal site for circumflex stent placed in 2007 and has 5 stenting of in-stent restenosis with a short Xience V in 2012.  The risks, benefits, and details of the procedure were explained to the patient. The patient verbalized  understanding and wanted to proceed. Informed written consent was obtained.  PROCEDURE TECHNIQUE: After Xylocaine anesthesia a 5 French sheath was placed in the right radial artery with a single anterior needle wall stick. Coronary angiography was done using a 5 [place] A2 multipurpose and 5 French 3.5 cm left Judkins catheter. Left ventriculography was done using a 5 [place] A2 MP catheter.  CONTRAST: Total of 90 cc.  COMPLICATIONS: None.  HEMODYNAMICS: Aortic pressure was 134/76 mmHg; LV pressure was 137/21 mmHg; LVEDP 21 mm mercury.  ANGIOGRAPHIC DATA: The left main coronary artery is calcified and contains an eccentric 30-40% distal stenosis before the circumflex LAD bifurcation to.  The left anterior descending artery is proximal calcification giving origin to a large first diagonal. Irregularities with calcification is noted throughout the mid vessel. The distal vessel contains a segmental tubular 50-70% stenosis.  The left circumflex artery is gives one large distal obtuse marginal. The stents in the distal OM are widely patent. There is a region of 50% stenosis proximal to the stent. A segmental region of 50-70% narrowing in the mid vessel. There is an eccentric somewhat hazy 50-70% region that overlaps the bifurcation with the continuation of the circumflex. The ostium of the circumflex contains heavy calcification and 50% narrowing. There is TIMI grade 3 flow throughout the circumflex.. I am somewhat concerned about the possibility of thrombus being present but other views demonstrate calcification. No focal high-grade stenosis is noted.  The right coronary artery is diffuse luminal irregularities proximal mid and distal vessel with eccentric segmental 70-80% mid PDA. The caliber of the PDA is relatively small.  LEFT VENTRICULOGRAM: Left ventricular  angiogram was done in the 30 RAO projection and revealed normal left ventricular wall motion and systolic function with an estimated ejection fraction  of 60 %. LVEDP was 21 mmHg.  IMPRESSIONS: 1. Moderate to moderately severe diffuse circumflex disease. Widely patent distal obtuse marginal stents. Region of concern in the proximal circumflex representing either heavy calcification or possibly thrombus. TIMI grade 3 flow was noted.  2. Moderate mid LAD and PDA disease is noted.  3. Normal left ventricular function  RECOMMENDATION: 1. If the patient continues having chest pain we may consider performing a myocardial perfusion study.  2. Add a long-acting nitrate.  3. Consider pulmonary process as the cause of the patient's dyspnea..    Discharge Exam: Blood pressure 117/58, pulse 60, temperature 97.8 F (36.6 C), temperature source Oral, resp. rate 16, height 5\' 8"  (1.727 m), weight 101.6 kg (223 lb 15.8 oz), SpO2 97.00%.    GEN: AAO x 3 in NAD CV: RRR, no m/r/g, no JVD Lungs: CTAB ABD: soft, obese, +BS EXT: no c/c/e Labs:   Lab Results  Component Value Date   WBC 6.0 08/31/2012   HGB 19.7* 08/31/2012   HCT 58.0* 08/31/2012   MCV 84.9 08/31/2012   PLT 256 08/31/2012    Recent Labs Lab 08/31/12 2237 08/31/12 2351  NA 137 140  K 4.4 4.8  CL 101 104  CO2 25  --   BUN 23 24*  CREATININE 1.66* 1.50*  CALCIUM 9.5  --   PROT 7.7  --   BILITOT 0.4  --   ALKPHOS 93  --   ALT 12  --   AST 17  --   GLUCOSE 173* 145*   Lab Results  Component Value Date   CKTOTAL 68 10/03/2011   CKMB 2.9 10/03/2011   TROPONINI <0.30 09/01/2012    Lab Results  Component Value Date   CHOL 187 10/03/2011   Lab Results  Component Value Date   HDL 48 10/03/2011   Lab Results  Component Value Date   LDLCALC 101* 10/03/2011   Lab Results  Component Value Date   TRIG 189* 10/03/2011   Lab Results  Component Value Date   CHOLHDL 3.9 10/03/2011   No results found for this basename: LDLDIRECT      Radiology:No acute cardiopulmonary process seen.     FOLLOW UP PLANS AND APPOINTMENTS Discharge Orders   Future Appointments Provider  Department Dept Phone   09/30/2012 9:00 AM Melvenia Needles, NP [facility] [phone]   Future Orders Complete By Expires     Diet - low sodium heart healthy  As directed     Increase activity slowly  As directed         Medication List    TAKE these medications       allopurinol 300 MG tablet  Commonly known as:  ZYLOPRIM  Take 300 mg by mouth daily.     aspirin EC 81 MG tablet  Take 81 mg by mouth daily.     clopidogrel 75 MG tablet  Commonly known as:  PLAVIX  Take 75 mg by mouth daily.     finasteride 5 MG tablet  Commonly known as:  PROSCAR  Take 1 tablet (5 mg total) by mouth daily.     fluticasone 50 MCG/ACT nasal spray  Commonly known as:  FLONASE  Place 2 sprays into the nose 2 (two) times daily.     gabapentin 300 MG capsule  Commonly known as:  NEURONTIN  Take 300 mg by mouth 2 (two) times daily.     gemfibrozil 600 MG tablet  Commonly known as:  LOPID  Take 600 mg by mouth daily.     glyBURIDE 2.5 MG tablet  Commonly known as:  DIABETA  Take 2.5 mg by mouth daily with breakfast.     guaifenesin 400 MG Tabs  Commonly known as:  HUMIBID E  Take 400 mg by mouth daily as needed (for cough). For cough     Iron 325 (65 FE) MG Tabs  Take 1 tablet by mouth daily.     isosorbide mononitrate 60 MG 24 hr tablet  Commonly known as:  IMDUR  Take 120 mg by mouth daily.     lisinopril 10 MG tablet  Commonly known as:  PRINIVIL,ZESTRIL  Take 10 mg by mouth daily.     metFORMIN 1000 MG tablet  Commonly known as:  GLUCOPHAGE  Take 1,000 mg by mouth 2 (two) times daily with a meal.     metoprolol tartrate 25 MG tablet  Commonly known as:  LOPRESSOR  Take 1 tablet (25 mg total) by mouth 2 (two) times daily.     nitroGLYCERIN 0.4 MG SL tablet  Commonly known as:  NITROSTAT  Place 1 tablet (0.4 mg total) under the tongue every 5 (five) minutes x 3 doses as needed for chest pain.     omeprazole 20 MG capsule  Commonly known as:  PRILOSEC  Take  20 mg by mouth 2 (two) times daily before a meal.     pravastatin 40 MG tablet  Commonly known as:  PRAVACHOL  Take 40 mg by mouth daily.     primidone 50 MG tablet  Commonly known as:  MYSOLINE  Take 50 mg by mouth daily.     PROVENTIL HFA 108 (90 BASE) MCG/ACT inhaler  Generic drug:  albuterol  Inhale 2 puffs into the lungs every 6 (six) hours as needed.     albuterol (2.5 MG/3ML) 0.083% nebulizer solution  Commonly known as:  PROVENTIL  Take 2.5 mg by nebulization every 6 (six) hours as needed.     tiotropium 18 MCG inhalation capsule  Commonly known as:  SPIRIVA  Place 18 mcg into inhaler and inhale daily.     vitamin B-12 1000 MCG tablet  Commonly known as:  CYANOCOBALAMIN  Take 1,000 mcg by mouth daily.           Follow-up Information   Follow up with Sinclair Grooms, MD On 09/05/2012. (as sched)    Contact information:   [place] [phone]      He knows to contact me if further worsening occurs. Could consider outpt NUC to demonstrate ischemia? Continue with aggressive antianginals as well as PPI. Ranexa?  BRING ALL MEDICATIONS WITH YOU TO FOLLOW UP APPOINTMENTS  Time spent with patient to include physician time: 40min with patient, education, labs, review of records. Nursing.  SignedCandee Furbish 09/01/2012, 9:21 AM

## 2012-09-01 NOTE — Progress Notes (Signed)
09/01/2012 11:51 AM  Discharge instructions given. IV's removed. Monitor removed. Belongings given  (phone clothes and glasses). To car via wheelchair  Natalea Sutliff, Carolynn Comment

## 2012-09-01 NOTE — H&P (Signed)
Physician History and Physical    BRODYN ROYCE MRN: [id number] DOB/AGE: 76-Aug-1936 76 y.o. Admit date: 08/31/2012   Primary Cardiologist: Sinclair Grooms, MD  CC:  Chest pain  HPI:  Mr. Miera is a 78 yo male with h/o DM, HTN, and CAD with last cath 09/2011 showing multi-vessel disease most remarkably 50-70% diffuse disease in LCx and moderate mid-LAD and PDA stenosis, who present to ED with resting chest pain with minimal improvement after 4 Nitro SL. Patient apparent  was recently seen by Dr. Tamala Julian for exertional chest discomfort/pressure without chest pain and Imdur was increased which significantly improved his symptoms initially. However, he went shopping earlier today and developed chest pain associated with diaphoresis. He subsequently took total of 4 Nitro SL and his scheduled Imdur 120 mg with very minimal improvement and development of associated Nausea, SOB and left arm pain before he came to ER.   Heparin drip started and Morphine was given, which significantly relieved his chest pain in ER. On my interview, he endorsed pain 1/10 and intermittent. No more SOB/Nausea/diaphoresis.  Review of systems: A review of 10 organ systems was done and is negative except as stated above in HPI  Past Medical History  Diagnosis Date  . Diabetes mellitus type II   . Hyperlipidemia   . CAD (coronary artery disease)     no prior MI, PCI x 2 in 2009 and 02/2011 in [place]  . GERD (gastroesophageal reflux disease)   . BPH (benign prostatic hyperplasia)   . Granuloma annulare   . Gout   . Hypertension    Past Surgical History  Procedure Laterality Date  . None     History   Social History  . Marital Status: Married    Spouse Name: N/A    Number of Children: N/A  . Years of Education: N/A   Occupational History  . retired    Social History Main Topics  . Smoking status: Former Smoker -- 1.00 packs/day for 64 years    Types: Cigarettes    Quit date: 05/29/2011  .  Smokeless tobacco: Never Used  . Alcohol Use: Yes     Comment: rare  . Drug Use: No  . Sexually Active: Not Currently   Other Topics Concern  . Not on file   Social History Narrative  . No narrative on file    Family History  Problem Relation Age of Onset  . Aortic aneurysm Father   . Aneurysm Mother     brain  . Cancer Brother   . COPD Sister      No Known Allergies   (Not in a hospital admission)  Current facility-administered medications:0.9 %  sodium chloride infusion, , Intravenous, Continuous, Manus Gunning, MD;  acetaminophen (TYLENOL) tablet 650 mg, 650 mg, Oral, Q4H PRN, Manus Gunning, MD;  albuterol (PROVENTIL) (5 MG/ML) 0.5% nebulizer solution 2.5 mg, 2.5 mg, Nebulization, Q4H PRN, Manus Gunning, MD;  allopurinol (ZYLOPRIM) tablet 300 mg, 300 mg, Oral, Daily, Manus Gunning, MD;  ALPRAZolam Duanne Moron) tablet 0.25 mg, 0.25 mg, Oral, BID PRN, Manus Gunning, MD aspirin EC tablet 81 mg, 81 mg, Oral, Daily, Manus Gunning, MD;  clopidogrel (PLAVIX) tablet 75 mg, 75 mg, Oral, Daily, Manus Gunning, MD;  finasteride (PROSCAR) tablet 5 mg, 5 mg, Oral, Daily, Manus Gunning, MD;  gabapentin (NEURONTIN) capsule 300 mg, 300 mg, Oral, BID, Manus Gunning, MD;  gemfibrozil (LOPID) tablet 600 mg, 600 mg, Oral, Daily, Manus Gunning, MD;  glyBURIDE (DIABETA) tablet 2.5 mg,  2.5 mg, Oral, Q breakfast, Manus Gunning, MD heparin ADULT infusion 100 units/mL (25000 units/250 mL), 1,400 Units/hr, Intravenous, Continuous, Ankit Nanavati, MD, Last Rate: 14 mL/hr at 08/31/12 2346, 1,400 Units/hr at 08/31/12 2346;  Iron TABS 1 tablet, 1 tablet, Oral, Daily, Manus Gunning, MD;  lisinopril (PRINIVIL,ZESTRIL) tablet 10 mg, 10 mg, Oral, Daily, Manus Gunning, MD;  metoprolol tartrate (LOPRESSOR) tablet 25 mg, 25 mg, Oral, BID, Manus Gunning, MD nitroGLYCERIN (NITROGLYN) 2 % ointment 1 inch, 1 inch, Topical, Q6H, Manus Gunning, MD, 1 inch at 09/01/12 0148;  ondansetron (ZOFRAN) injection 4 mg, 4 mg, Intravenous, Q6H PRN, Manus Gunning, MD;  primidone (MYSOLINE) tablet 50 mg, 50 mg, Oral, Daily, Manus Gunning, MD;   simvastatin (ZOCOR) tablet 40 mg, 40 mg, Oral, q1800, Manus Gunning, MD;  tiotropium [facility]) inhalation capsule 18 mcg, 18 mcg, Inhalation, Daily, Manus Gunning, MD vitamin B-12 (CYANOCOBALAMIN) tablet 1,000 mcg, 1,000 mcg, Oral, Daily, Manus Gunning, MD Current outpatient prescriptions:albuterol (PROVENTIL HFA) 108 (90 BASE) MCG/ACT inhaler, Inhale 2 puffs into the lungs every 6 (six) hours as needed., Disp: , Rfl: ;  albuterol (PROVENTIL) (2.5 MG/3ML) 0.083% nebulizer solution, Take 2.5 mg by nebulization every 6 (six) hours as needed., Disp: , Rfl: ;  allopurinol (ZYLOPRIM) 300 MG tablet, Take 300 mg by mouth daily., Disp: , Rfl:  aspirin EC 81 MG tablet, Take 81 mg by mouth daily., Disp: , Rfl: ;  clopidogrel (PLAVIX) 75 MG tablet, Take 75 mg by mouth daily., Disp: , Rfl: ;  Ferrous Sulfate (IRON) 325 (65 FE) MG TABS, Take 1 tablet by mouth daily., Disp: , Rfl: ;  finasteride (PROSCAR) 5 MG tablet, Take 1 tablet (5 mg total) by mouth daily., Disp: 30 tablet, Rfl: 5 fluticasone (FLONASE) 50 MCG/ACT nasal spray, Place 2 sprays into the nose 2 (two) times daily., Disp: 16 g, Rfl: 5;  gabapentin (NEURONTIN) 300 MG capsule, Take 300 mg by mouth 2 (two) times daily., Disp: , Rfl: ;  gemfibrozil (LOPID) 600 MG tablet, Take 600 mg by mouth daily., Disp: , Rfl: ;  glyBURIDE (DIABETA) 2.5 MG tablet, Take 2.5 mg by mouth daily with breakfast., Disp: , Rfl:  guaifenesin (HUMIBID E) 400 MG TABS, Take 400 mg by mouth daily as needed (for cough). For cough, Disp: , Rfl: ;  isosorbide mononitrate (IMDUR) 60 MG 24 hr tablet, Take 120 mg by mouth daily., Disp: , Rfl: ;  lisinopril (PRINIVIL,ZESTRIL) 10 MG tablet, Take 10 mg by mouth daily., Disp: , Rfl: ;  metFORMIN (GLUCOPHAGE) 1000 MG tablet, Take 1,000 mg by mouth 2 (two) times daily with a meal. , Disp: , Rfl:  nitroGLYCERIN (NITROSTAT) 0.4 MG SL tablet, Place 1 tablet (0.4 mg total) under the tongue every 5 (five) minutes x 3 doses as needed for chest pain., Disp: 25 tablet, Rfl: 3;   omeprazole (PRILOSEC) 20 MG capsule, Take 20 mg by mouth 2 (two) times daily before a meal. , Disp: , Rfl: ;  pravastatin (PRAVACHOL) 40 MG tablet, Take 40 mg by mouth daily., Disp: , Rfl: ;  primidone (MYSOLINE) 50 MG tablet, Take 50 mg by mouth daily. , Disp: , Rfl:  tiotropium (SPIRIVA) 18 MCG inhalation capsule, Place 18 mcg into inhaler and inhale daily., Disp: , Rfl: ;  vitamin B-12 (CYANOCOBALAMIN) 1000 MCG tablet, Take 1,000 mcg by mouth daily., Disp: , Rfl:   Physical Exam: Blood pressure 123/89, pulse 80, temperature 98.1 F (36.7 C), temperature source Oral, resp. rate 23, height 5\' 8"  (1.727 m), weight 223  lb (101.152 kg), SpO2 97.00%.; Body mass index is 33.91 kg/(m^2). Temp:  [98.1 F (36.7 C)] 98.1 F (36.7 C) (05/24 2215) Pulse Rate:  [80-88] 80 (05/24 2345) Resp:  [15-23] 23 (05/24 2345) BP: (123-160)/(62-105) 123/89 mmHg (05/24 2345) SpO2:  [97 %-100 %] 97 % (05/24 2345) Weight:  [223 lb (101.152 kg)] 223 lb (101.152 kg) (05/24 2301)  No intake or output data in the 24 hours ending 09/01/12 0046 General: NAD AAOx3 on my interview Heent: MMM Neck: No JVD  CV: Nondisplaced PMI.  RRR, nl S1/S2, no S3/S4, no murmur. No carotid bruit   Lungs: Clear to auscultation bilaterally with normal respiratory effort Abdomen: Soft, nontender, nondistended Extremities: No clubbing or cyanosis.  Normal pedal pulses. No pedal edema Skin: Intact without lesions or rashes  Neurologic: Alert and oriented x 3, grossly nonfocal  Psych: Normal mood and affect    Labs: No results found for this basename: CKTOTAL, CKMB, TROPONINI,  in the last 72 hours Lab Results  Component Value Date   WBC 6.0 08/31/2012   HGB 19.7* 08/31/2012   HCT 58.0* 08/31/2012   MCV 84.9 08/31/2012   PLT 256 08/31/2012    Recent Labs Lab 08/31/12 2237 08/31/12 2351  NA 137 140  K 4.4 4.8  CL 101 104  CO2 25  --   BUN 23 24*  CREATININE 1.66* 1.50*  CALCIUM 9.5  --   PROT 7.7  --   BILITOT 0.4  --   ALKPHOS  93  --   ALT 12  --   AST 17  --   GLUCOSE 173* 145*   Lab Results  Component Value Date   CHOL 187 10/03/2011   HDL 48 10/03/2011   LDLCALC 101* 10/03/2011   TRIG 189* 10/03/2011       EKG:  NSR with non-specific T wave changes. Cath 09/2011 :  1. Moderate to moderately severe diffuse circumflex disease. Widely patent distal obtuse marginal stents. Region of concern in the proximal circumflex representing either heavy calcification or possibly thrombus. TIMI grade 3 flow was noted.  2. Moderate mid LAD and PDA disease is noted.  3. Normal left ventricular function  Radiology:  Dg Chest Portable 1 View  08/31/2012   *RADIOLOGY REPORT*  Clinical Data: Chest pain; history of smoking.  PORTABLE CHEST - 1 VIEW  Comparison: Chest radiograph performed 10/02/2011  Findings: The lungs are well-aerated and clear.  There is no evidence of focal opacification, pleural effusion or pneumothorax.  The cardiomediastinal silhouette is within normal limits.  No acute osseous abnormalities are seen.  IMPRESSION: No acute cardiopulmonary process seen.   Original Report Authenticated By: Santa Lighter, M.D.    ASSESSMENT:  Mr. Poarch is a 78 yo male with h/o DM, HTN, and CAD with last cath 09/2011 showing multi-vessel disease most remarkably 50-70% diffuse disease in LCx and moderate mid-LAD and PDA stenosis, who present with unstable angina.  PLAN:  1) Heparin gtt ACS protocol 2) Trend cardiac enzymes x3 Q6hrs 3) Continue DAPT ASA & Plavix, Metoprolol, Statin, ACEI Morphine PRN, Oxygen. Nitro patch (as no effect from Imdur/Nitro prn) 4) If chest pain recurs/worse, start Nitro drip instead of Nitro patch 5) NPO 6) Left heart cath in AM  if Troponin positive. If Troponin remains negative and chest pain free, a PET stress can be considered to eval ischemic territory for culprit lesion causing symptoms before cath and possible intervention. 7) Will gentle hydrate, Re: AKI, CIN prevention and Normal LVEF. 8)  Stepdown admission  Signed: Manus Gunning, MD Cardiology Fellow 09/01/2012, 12:46 AM

## 2012-09-01 NOTE — ED Notes (Signed)
Dr. Liu at bedside 

## 2012-09-05 DIAGNOSIS — J449 Chronic obstructive pulmonary disease, unspecified: Secondary | ICD-10-CM | POA: Diagnosis not present

## 2012-09-05 DIAGNOSIS — E119 Type 2 diabetes mellitus without complications: Secondary | ICD-10-CM | POA: Diagnosis not present

## 2012-09-05 DIAGNOSIS — I209 Angina pectoris, unspecified: Secondary | ICD-10-CM | POA: Diagnosis not present

## 2012-09-05 DIAGNOSIS — I2510 Atherosclerotic heart disease of native coronary artery without angina pectoris: Secondary | ICD-10-CM | POA: Diagnosis not present

## 2012-09-05 DIAGNOSIS — I10 Essential (primary) hypertension: Secondary | ICD-10-CM | POA: Diagnosis not present

## 2012-09-05 DIAGNOSIS — E782 Mixed hyperlipidemia: Secondary | ICD-10-CM | POA: Diagnosis not present

## 2012-09-10 DIAGNOSIS — E119 Type 2 diabetes mellitus without complications: Secondary | ICD-10-CM | POA: Diagnosis not present

## 2012-09-10 DIAGNOSIS — E782 Mixed hyperlipidemia: Secondary | ICD-10-CM | POA: Diagnosis not present

## 2012-09-10 DIAGNOSIS — I2510 Atherosclerotic heart disease of native coronary artery without angina pectoris: Secondary | ICD-10-CM | POA: Diagnosis not present

## 2012-09-10 DIAGNOSIS — I10 Essential (primary) hypertension: Secondary | ICD-10-CM | POA: Diagnosis not present

## 2012-09-10 DIAGNOSIS — J449 Chronic obstructive pulmonary disease, unspecified: Secondary | ICD-10-CM | POA: Diagnosis not present

## 2012-09-10 DIAGNOSIS — I209 Angina pectoris, unspecified: Secondary | ICD-10-CM | POA: Diagnosis not present

## 2012-09-11 ENCOUNTER — Other Ambulatory Visit: Payer: Self-pay | Admitting: Interventional Cardiology

## 2012-09-11 DIAGNOSIS — I209 Angina pectoris, unspecified: Secondary | ICD-10-CM | POA: Diagnosis not present

## 2012-09-12 ENCOUNTER — Encounter (HOSPITAL_COMMUNITY)
Admission: RE | Disposition: A | Discharge status: Home / Self Care | Payer: Self-pay | Source: Ambulatory Visit | Attending: Interventional Cardiology

## 2012-09-12 ENCOUNTER — Observation Stay (HOSPITAL_COMMUNITY)
Admission: RE | Admit: 2012-09-12 | Discharge: 2012-09-13 | Disposition: A | Discharge status: Home / Self Care | Payer: Medicare Other | Source: Ambulatory Visit | Attending: Interventional Cardiology | Admitting: Interventional Cardiology

## 2012-09-12 ENCOUNTER — Encounter (HOSPITAL_COMMUNITY): Payer: Self-pay | Admitting: *Deleted

## 2012-09-12 DIAGNOSIS — N183 Chronic kidney disease, stage 3 unspecified: Secondary | ICD-10-CM | POA: Insufficient documentation

## 2012-09-12 DIAGNOSIS — G4733 Obstructive sleep apnea (adult) (pediatric): Secondary | ICD-10-CM | POA: Insufficient documentation

## 2012-09-12 DIAGNOSIS — E669 Obesity, unspecified: Secondary | ICD-10-CM | POA: Diagnosis not present

## 2012-09-12 DIAGNOSIS — E119 Type 2 diabetes mellitus without complications: Secondary | ICD-10-CM | POA: Insufficient documentation

## 2012-09-12 DIAGNOSIS — J449 Chronic obstructive pulmonary disease, unspecified: Secondary | ICD-10-CM | POA: Diagnosis not present

## 2012-09-12 DIAGNOSIS — I25119 Atherosclerotic heart disease of native coronary artery with unspecified angina pectoris: Secondary | ICD-10-CM | POA: Diagnosis present

## 2012-09-12 DIAGNOSIS — I2510 Atherosclerotic heart disease of native coronary artery without angina pectoris: Secondary | ICD-10-CM | POA: Diagnosis not present

## 2012-09-12 DIAGNOSIS — J4489 Other specified chronic obstructive pulmonary disease: Secondary | ICD-10-CM | POA: Insufficient documentation

## 2012-09-12 DIAGNOSIS — R9439 Abnormal result of other cardiovascular function study: Secondary | ICD-10-CM | POA: Diagnosis not present

## 2012-09-12 DIAGNOSIS — N184 Chronic kidney disease, stage 4 (severe): Secondary | ICD-10-CM | POA: Diagnosis present

## 2012-09-12 DIAGNOSIS — I209 Angina pectoris, unspecified: Secondary | ICD-10-CM | POA: Diagnosis not present

## 2012-09-12 DIAGNOSIS — E785 Hyperlipidemia, unspecified: Secondary | ICD-10-CM | POA: Insufficient documentation

## 2012-09-12 DIAGNOSIS — I200 Unstable angina: Secondary | ICD-10-CM | POA: Insufficient documentation

## 2012-09-12 DIAGNOSIS — I129 Hypertensive chronic kidney disease with stage 1 through stage 4 chronic kidney disease, or unspecified chronic kidney disease: Secondary | ICD-10-CM | POA: Diagnosis not present

## 2012-09-12 DIAGNOSIS — Z6835 Body mass index (BMI) 35.0-35.9, adult: Secondary | ICD-10-CM | POA: Insufficient documentation

## 2012-09-12 HISTORY — PX: LEFT HEART CATHETERIZATION WITH CORONARY ANGIOGRAM: SHX5451

## 2012-09-12 LAB — GLUCOSE, CAPILLARY: Glucose-Capillary: 175 mg/dL — ABNORMAL HIGH (ref 70–99)

## 2012-09-12 SURGERY — LEFT HEART CATHETERIZATION WITH CORONARY ANGIOGRAM
Anesthesia: LOCAL

## 2012-09-12 MED ORDER — LIDOCAINE HCL (PF) 1 % IJ SOLN
INTRAMUSCULAR | Status: AC
  Filled 2012-09-12: qty 30

## 2012-09-12 MED ORDER — ALLOPURINOL 300 MG PO TABS
300.0000 mg | ORAL_TABLET | Freq: Every day | ORAL | Status: DC
  Administered 2012-09-12: 300 mg via ORAL
  Filled 2012-09-12 (×2): qty 1

## 2012-09-12 MED ORDER — ALBUTEROL SULFATE HFA 108 (90 BASE) MCG/ACT IN AERS
2.0000 | INHALATION_SPRAY | Freq: Four times a day (QID) | RESPIRATORY_TRACT | Status: DC
  Filled 2012-09-12: qty 6.7

## 2012-09-12 MED ORDER — PANTOPRAZOLE SODIUM 40 MG PO TBEC
40.0000 mg | DELAYED_RELEASE_TABLET | Freq: Every day | ORAL | Status: DC
  Administered 2012-09-12: 40 mg via ORAL
  Filled 2012-09-12: qty 1

## 2012-09-12 MED ORDER — GABAPENTIN 300 MG PO CAPS
300.0000 mg | ORAL_CAPSULE | Freq: Two times a day (BID) | ORAL | Status: DC
  Administered 2012-09-12: 300 mg via ORAL
  Filled 2012-09-12 (×2): qty 1

## 2012-09-12 MED ORDER — SODIUM CHLORIDE 0.9 % IJ SOLN: 3.0000 mL | Freq: Two times a day (BID) | INTRAMUSCULAR | Status: DC

## 2012-09-12 MED ORDER — GEMFIBROZIL 600 MG PO TABS
600.0000 mg | ORAL_TABLET | Freq: Every day | ORAL | Status: DC
  Administered 2012-09-12: 600 mg via ORAL
  Filled 2012-09-12 (×2): qty 1

## 2012-09-12 MED ORDER — LISINOPRIL 10 MG PO TABS
10.0000 mg | ORAL_TABLET | Freq: Every day | ORAL | Status: DC
  Administered 2012-09-12: 10 mg via ORAL
  Filled 2012-09-12 (×2): qty 1

## 2012-09-12 MED ORDER — GLYBURIDE 2.5 MG PO TABS
2.5000 mg | ORAL_TABLET | Freq: Every day | ORAL | Status: DC
Start: 2012-09-13 — End: 2012-09-13
  Filled 2012-09-12: qty 1

## 2012-09-12 MED ORDER — DIAZEPAM 5 MG PO TABS: 5.0000 mg | ORAL_TABLET | ORAL | Status: AC

## 2012-09-12 MED ORDER — NITROGLYCERIN 0.4 MG SL SUBL: 0.4000 mg | SUBLINGUAL_TABLET | SUBLINGUAL | Status: DC | PRN

## 2012-09-12 MED ORDER — MIDAZOLAM HCL 2 MG/2ML IJ SOLN
INTRAMUSCULAR | Status: AC
  Filled 2012-09-12: qty 2

## 2012-09-12 MED ORDER — IRON 325 (65 FE) MG PO TABS: 1.0000 | ORAL_TABLET | Freq: Every day | ORAL | Status: DC

## 2012-09-12 MED ORDER — PRIMIDONE 50 MG PO TABS
50.0000 mg | ORAL_TABLET | Freq: Every day | ORAL | Status: DC
  Administered 2012-09-12: 50 mg via ORAL
  Filled 2012-09-12 (×2): qty 1

## 2012-09-12 MED ORDER — ASPIRIN EC 81 MG PO TBEC
81.0000 mg | DELAYED_RELEASE_TABLET | Freq: Every day | ORAL | Status: DC
  Filled 2012-09-12 (×2): qty 1

## 2012-09-12 MED ORDER — ONDANSETRON HCL 4 MG/2ML IJ SOLN: 4.0000 mg | Freq: Four times a day (QID) | INTRAMUSCULAR | Status: DC | PRN

## 2012-09-12 MED ORDER — HEPARIN SODIUM (PORCINE) 1000 UNIT/ML IJ SOLN
INTRAMUSCULAR | Status: AC
  Filled 2012-09-12: qty 1

## 2012-09-12 MED ORDER — FINASTERIDE 5 MG PO TABS
5.0000 mg | ORAL_TABLET | Freq: Every day | ORAL | Status: DC
  Administered 2012-09-12: 5 mg via ORAL
  Filled 2012-09-12 (×2): qty 1

## 2012-09-12 MED ORDER — ISOSORBIDE MONONITRATE ER 60 MG PO TB24
120.0000 mg | ORAL_TABLET | Freq: Every day | ORAL | Status: DC
  Administered 2012-09-12: 120 mg via ORAL
  Filled 2012-09-12 (×2): qty 2

## 2012-09-12 MED ORDER — HEPARIN (PORCINE) IN NACL 2-0.9 UNIT/ML-% IJ SOLN
INTRAMUSCULAR | Status: AC
  Filled 2012-09-12: qty 1000

## 2012-09-12 MED ORDER — FERROUS SULFATE 325 (65 FE) MG PO TABS
325.0000 mg | ORAL_TABLET | Freq: Every day | ORAL | Status: DC
  Administered 2012-09-12: 20:00:00 325 mg via ORAL
  Filled 2012-09-12 (×2): qty 1

## 2012-09-12 MED ORDER — SODIUM CHLORIDE 0.9 % IV SOLN
INTRAVENOUS | Status: AC
  Administered 2012-09-12: 1000 mL via INTRAVENOUS

## 2012-09-12 MED ORDER — ASPIRIN 81 MG PO CHEW
324.0000 mg | CHEWABLE_TABLET | ORAL | Status: AC
  Filled 2012-09-12: qty 4

## 2012-09-12 MED ORDER — ALBUTEROL SULFATE HFA 108 (90 BASE) MCG/ACT IN AERS
2.0000 | INHALATION_SPRAY | Freq: Four times a day (QID) | RESPIRATORY_TRACT | Status: DC | PRN
  Filled 2012-09-12: qty 6.7

## 2012-09-12 MED ORDER — SODIUM CHLORIDE 0.9 % IV SOLN
INTRAVENOUS | Status: DC
  Administered 2012-09-12: 11:00:00 via INTRAVENOUS

## 2012-09-12 MED ORDER — SODIUM CHLORIDE 0.9 % IJ SOLN: 3.0000 mL | INTRAMUSCULAR | Status: DC | PRN

## 2012-09-12 MED ORDER — ENOXAPARIN SODIUM 40 MG/0.4ML ~~LOC~~ SOLN
40.0000 mg | SUBCUTANEOUS | Status: DC
  Filled 2012-09-12: qty 0.4

## 2012-09-12 MED ORDER — GUAIFENESIN 400 MG PO TABS: 400.0000 mg | ORAL_TABLET | Freq: Four times a day (QID) | ORAL | Status: DC | PRN

## 2012-09-12 MED ORDER — OXYCODONE-ACETAMINOPHEN 5-325 MG PO TABS: 1.0000 | ORAL_TABLET | ORAL | Status: DC | PRN

## 2012-09-12 MED ORDER — VITAMIN B-12 1000 MCG PO TABS
1000.0000 ug | ORAL_TABLET | Freq: Every day | ORAL | Status: DC
  Administered 2012-09-12: 1000 ug via ORAL
  Filled 2012-09-12 (×2): qty 1

## 2012-09-12 MED ORDER — SIMVASTATIN 40 MG PO TABS: 40.0000 mg | ORAL_TABLET | Freq: Every day | ORAL | Status: DC

## 2012-09-12 MED ORDER — ACETAMINOPHEN 325 MG PO TABS: 650.0000 mg | ORAL_TABLET | ORAL | Status: DC | PRN

## 2012-09-12 MED ORDER — METOPROLOL TARTRATE 12.5 MG HALF TABLET
25.0000 mg | ORAL_TABLET | Freq: Two times a day (BID) | ORAL | Status: DC
  Administered 2012-09-12: 25 mg via ORAL

## 2012-09-12 MED ORDER — DIAZEPAM 5 MG PO TABS
ORAL_TABLET | ORAL | Status: AC
  Administered 2012-09-12: 5 mg via ORAL
  Filled 2012-09-12: qty 1

## 2012-09-12 MED ORDER — SODIUM CHLORIDE 0.9 % IV SOLN: 250.0000 mL | INTRAVENOUS | Status: DC | PRN

## 2012-09-12 MED ORDER — CLOPIDOGREL BISULFATE 75 MG PO TABS: 75.0000 mg | ORAL_TABLET | Freq: Every day | ORAL | Status: DC

## 2012-09-12 MED ORDER — TIOTROPIUM BROMIDE MONOHYDRATE 18 MCG IN CAPS
18.0000 ug | ORAL_CAPSULE | Freq: Every day | RESPIRATORY_TRACT | Status: DC
  Administered 2012-09-13: 18 ug via RESPIRATORY_TRACT
  Filled 2012-09-12: qty 5

## 2012-09-12 MED ORDER — VERAPAMIL HCL 2.5 MG/ML IV SOLN
INTRAVENOUS | Status: AC
  Filled 2012-09-12: qty 2

## 2012-09-12 MED ORDER — ASPIRIN 81 MG PO CHEW
CHEWABLE_TABLET | ORAL | Status: AC
  Administered 2012-09-12: 324 mg via ORAL
  Filled 2012-09-12: qty 4

## 2012-09-12 MED ORDER — GUAIFENESIN 100 MG/5ML PO SOLN
400.0000 mg | Freq: Four times a day (QID) | ORAL | Status: DC | PRN
  Filled 2012-09-12: qty 20

## 2012-09-12 NOTE — CV Procedure (Signed)
     Diagnostic Cardiac Catheterization Report  Marco Acevedo  78 y.o.  male 1935/01/08  Procedure Date: 09/12/2012 Referring Physician: Melinda Crutch, MD Primary Cardiologist:: HWBSmith, [name], MD   PROCEDURE:  Left heart catheterization with selective coronary angiography, left ventriculogram.  INDICATIONS:  78 year old gentleman with class IV angina pectoris. Abnormal nuclear study showing mild inferolateral ischemia.  The risks, benefits, and details of the procedure were explained to the patient.  The patient verbalized understanding and wanted to proceed.  Informed written consent was obtained.  PROCEDURE TECHNIQUE:  After Xylocaine anesthesia a 5 French Slender sheath was placed in the right radial artery with a single anterior needle wall stick.   Coronary angiography was done using a 5 [place] B2 MP and 5 [place] JR 4 catheter.  Left ventriculography was done using a 5 [place] JR 4  Catheter by hand injection.    CONTRAST:  Total of 105 cc.  COMPLICATIONS:  None.    HEMODYNAMICS:  Aortic pressure was 160/84 mmHg; LV pressure was 166/12 mmHg; LVEDP 17 mm mercury.  There was no gradient between the left ventricle and aorta.    ANGIOGRAPHIC DATA:   The left main coronary artery is calcified distally. Eccentric 20% narrowing is present..  The left anterior descending artery is large vessel patent around the left ventricular apex with multiple areas of irregularity up to 40% throughout the mid and distal vessel.   The left circumflex artery contains ostial calcification with 50-70% narrowing and proximal eccentric 95% stenosis. The continuation from this lesion to the mid vessel is diffusely diseased. The stent in the distal vessel is widely patent. There is 50% stenosis distal to the stent..  The right coronary artery is dominant and has irregularities up to 30-50% throughout the mid vessel. The distal vessel before the PDA contains an eccentric 50-70%.  LEFT VENTRICULOGRAM:  Left  ventricular angiogram was done in the 30 RAO projection and revealed normal left ventricular wall motion and systolic function with an estimated ejection fraction of 75 %.   IMPRESSIONS:  1. Class IV angina due to high-grade obstruction in the proximal circumflex. The circumflex lesion is complex because there is also moderately severe ostial disease with calcification as the vessel arises at a right angle from the left main LAD transition. The distal stent is patent with 50% stenosis at the distal margin.  2. Moderate distal RCA disease, 50-70% distal before the origin of the PDA.  3. Irregularities within the LAD but no high-grade obstruction.  4. No significant left main disease but eccentric calcified plaque noted at the bifurcation with the circumflex.  5. Hyperdynamic left ventricular function  6. Stage [name] chronic renal insufficiency with creatinine of 1.54   RECOMMENDATION:  1. Will discuss treatment options which could include continuation of medical therapy versus single-vessel CABG versus moderate to high risk PCI in this diabetic who has already had the circumflex stented twice in the past (but sites different than where the current severe disease is located).  2. We'll get TCTS consult with Dr. PVT  3. Hydration and reassess renal function in 48 hours.  4. If PCI, we'll perform from the right femoral approach rather than radial, as LAD protection may be necessary if we have to stent the ostium of the circumflex. This may even require a distal left main bifurcation technique crossing over the LAD.

## 2012-09-12 NOTE — Progress Notes (Signed)
TR BAND REMOVAL  LOCATION:    right radial  DEFLATED PER PROTOCOL:    yes  TIME BAND OFF / DRESSING APPLIED:    1820   SITE UPON ARRIVAL:    Level 0  SITE AFTER BAND REMOVAL:    Level 0  REVERSE ALLEN'S TEST:     positive  CIRCULATION SENSATION AND MOVEMENT:    Within Normal Limits   yes  COMMENTS:   Tolerated procedure well

## 2012-09-12 NOTE — H&P (Addendum)
Please refer to scanned office notes from [facility] cardiology for full details.  The patient has a long-standing history of coronary artery disease having undergone coronary stenting to the circumflex in 2007 and again in 2012. Recently the patient has had nearly continuous chest discomfort responsive to nitroglycerin. The discomfort is as often occurring at rest has with exertion. A recent nuclear study demonstrated inferolateral ischemia. The study was early positive for development of angina before 6 minutes of exercise. He also had EKG changes and angina. He is on long-acting nitrates and beta blocker therapy without control of symptoms.  Angiography is indicated to define anatomy and help guide therapy.  The procedure and potential risks including death, myocardial infarction, bleeding, limb ischemia, kidney failure, stroke, allergy, emergency surgery, among others were discussed in detail with the patient and accepted.  Creatinine is 1.56. Lisinopril was held today along with metformin. He is received 3 hours of saline at 200 cc per hour before coming to the cath lab.

## 2012-09-13 ENCOUNTER — Other Ambulatory Visit: Payer: Self-pay | Admitting: Interventional Cardiology

## 2012-09-13 DIAGNOSIS — N184 Chronic kidney disease, stage 4 (severe): Secondary | ICD-10-CM | POA: Diagnosis present

## 2012-09-13 DIAGNOSIS — R9439 Abnormal result of other cardiovascular function study: Secondary | ICD-10-CM | POA: Diagnosis not present

## 2012-09-13 DIAGNOSIS — I2510 Atherosclerotic heart disease of native coronary artery without angina pectoris: Secondary | ICD-10-CM

## 2012-09-13 DIAGNOSIS — I209 Angina pectoris, unspecified: Secondary | ICD-10-CM | POA: Diagnosis not present

## 2012-09-13 LAB — BASIC METABOLIC PANEL
CO2: 23 mEq/L (ref 19–32)
Calcium: 9 mg/dL (ref 8.4–10.5)
Creatinine, Ser: 1.39 mg/dL — ABNORMAL HIGH (ref 0.50–1.35)
GFR calc Af Amer: 56 mL/min — ABNORMAL LOW (ref >90)
Sodium: 140 mEq/L (ref 135–145)

## 2012-09-13 LAB — CBC
Platelets: 242 10*3/uL (ref 150–400)
RBC: 3.91 MIL/uL — ABNORMAL LOW (ref 4.22–5.81)
RDW: 14.1 % (ref 11.5–15.5)
WBC: 5.3 10*3/uL (ref 4.0–10.5)

## 2012-09-13 LAB — CREATININE, SERUM
Creatinine, Ser: 1.31 mg/dL (ref 0.50–1.35)
GFR calc Af Amer: 60 mL/min — ABNORMAL LOW (ref >90)
GFR calc non Af Amer: 52 mL/min — ABNORMAL LOW (ref >90)

## 2012-09-13 NOTE — Consult Note (Signed)
[address]            [place], [address]          [phone]       Nicholus J Nikkel [facility] Medical Record [id number] Date of Birth: 05-Feb-1935  No ref. provider found Dimas Aguas, MD  Chief Complaint:   No chief complaint on file.  class [name] exertional angina with chest pain and tightness History of circumflex PCI and stent in 2012  History of Present Illness:     78 year old obese Caucasian male diabetic with COPD and history of CAD. Patient had prior PCI with drug-eluting stents to the circumflex in 2010 and 2012 in [place]. He septally moved to [place]. He experienced increasing exertional shortness of breath associated with chest tightness. He is evaluated by [facility] for his C. OPD. FEV1 was 2.2 and FVC was 3.1. His bronchodilator therapy was optimized but his symptoms continued. He was evaluated by Dr. Tamala Julian and a stress test was performed which was positive for ischemia in the circumflex distribution. He underwent cardiac catheterization demonstrating patent stents in the circumflex distally but a tight 95% proximal stenosis close to the origin. It is felt the new lesion is treatable with a stent however due to its proximity to the vessel origin and left main and a surgical opinion was requested regarding the patient's operability for backup of a increased risk PCI of the circumflex.  The patient continues to take Plavix daily for the past few years. He has no acute bleeding problems but does bruise easily. The patient has sleep apnea and uses a mask at night. He has a mildly productive cough but no recent antibiotic the requirement.  Cardiac catheterization demonstrated mild disease of the LAD and right coronary with good LV systolic function. A 2-D echo is pending but there is no indication of significant valvular heart disease. His medical therapy has been optimized for his single vessel disease with some limitation on the amount of beta  blocker the patient tolerated the to his COPD.  Current Activity/ Functional Status: Patient lives in a condominium with his wife.   Past Medical History  Diagnosis Date  . Diabetes mellitus type II   . Hyperlipidemia   . CAD (coronary artery disease)     no prior MI, PCI x 2 in 2009 and 02/2011 in [place]  . GERD (gastroesophageal reflux disease)   . BPH (benign prostatic hyperplasia)   . Granuloma annulare   . Gout   . Hypertension     Past Surgical History  Procedure Laterality Date  . None    . Cardiac catheterization      History  Smoking status  . Former Smoker -- 1.00 packs/day for 64 years  . Types: Cigarettes  . Quit date: 05/29/2011  Smokeless tobacco  . Never Used    History  Alcohol Use  . Yes    Comment: rare    History   Social History  . Marital Status: Married    Spouse Name: N/A    Number of Children: N/A  . Years of Education: N/A   Occupational History  . retired    Social History Main Topics  . Smoking status: Former Smoker -- 1.00 packs/day for 64 years    Types: Cigarettes    Quit date: 05/29/2011  . Smokeless tobacco: Never Used  . Alcohol Use: Yes  Comment: rare  . Drug Use: No  . Sexually Active: Not Currently   Other Topics Concern  . Not on file   Social History Narrative  . No narrative on file    No Known Allergies  Current Facility-Administered Medications  Medication Dose Route Frequency Provider Last Rate Last Dose  . 0.9 %  sodium chloride infusion   Intravenous Continuous Belva Crome [name], MD 100 mL/hr at 09/13/12 0600    . acetaminophen (TYLENOL) tablet 650 mg  650 mg Oral Q4H PRN Belva Crome [name], MD      . albuterol (PROVENTIL HFA;VENTOLIN HFA) 108 (90 BASE) MCG/ACT inhaler 2 puff  2 puff Inhalation Q6H PRN Belva Crome [name], MD      . allopurinol (ZYLOPRIM) tablet 300 mg  300 mg Oral Daily Belva Crome [name], MD   300 mg at 09/12/12 2002  . aspirin EC tablet 81 mg  81 mg Oral Daily Belva Crome [name],  MD      . clopidogrel (PLAVIX) tablet 75 mg  75 mg Oral Q breakfast Belva Crome [name], MD      . enoxaparin (LOVENOX) injection 40 mg  40 mg Subcutaneous Q24H Belva Crome [name], MD      . ferrous sulfate tablet 325 mg  325 mg Oral q1800 Belva Crome [name], MD   325 mg at 09/12/12 2003  . finasteride (PROSCAR) tablet 5 mg  5 mg Oral Daily Belva Crome [name], MD   5 mg at 09/12/12 2002  . gabapentin (NEURONTIN) capsule 300 mg  300 mg Oral BID Belva Crome [name], MD   300 mg at 09/12/12 2002  . gemfibrozil (LOPID) tablet 600 mg  600 mg Oral Daily Belva Crome [name], MD   600 mg at 09/12/12 2002  . glyBURIDE (DIABETA) tablet 2.5 mg  2.5 mg Oral Q breakfast Belva Crome [name], MD      . guaiFENesin (ROBITUSSIN) 100 MG/5ML solution 400 mg  400 mg Oral Q6H PRN Belva Crome [name], MD      . isosorbide mononitrate (IMDUR) 24 hr tablet 120 mg  120 mg Oral Daily Belva Crome [name], MD   120 mg at 09/12/12 2002  . lisinopril (PRINIVIL,ZESTRIL) tablet 10 mg  10 mg Oral Daily Belva Crome [name], MD   10 mg at 09/12/12 2003  . metoprolol tartrate (LOPRESSOR) tablet 25 mg  25 mg Oral BID Belva Crome [name], MD   25 mg at 09/12/12 2133  . nitroGLYCERIN (NITROSTAT) SL tablet 0.4 mg  0.4 mg Sublingual Q5 Min x 3 PRN Belva Crome [name], MD      . ondansetron [facility]) injection 4 mg  4 mg Intravenous Q6H PRN Belva Crome [name], MD      . oxyCODONE-acetaminophen (PERCOCET/ROXICET) 5-325 MG per tablet 1-2 tablet  1-2 tablet Oral Q4H PRN Belva Crome [name], MD      . pantoprazole (PROTONIX) EC tablet 40 mg  40 mg Oral Daily Belva Crome [name], MD   40 mg at 09/12/12 2002  . primidone (MYSOLINE) tablet 50 mg  50 mg Oral Daily Belva Crome [name], MD   50 mg at 09/12/12 2003  . simvastatin (ZOCOR) tablet 40 mg  40 mg Oral q1800 Belva Crome [name], MD      . tiotropium [facility]) inhalation capsule 18 mcg  18 mcg Inhalation Daily Sinclair Grooms, MD   18 mcg  at 09/13/12 0740  . vitamin B-12 (CYANOCOBALAMIN) tablet 1,000 mcg  1,000 mcg  Oral Daily Belva Crome [name], MD   1,000 mcg at 09/12/12 2002     Family History  Problem Relation Age of Onset  . Aortic aneurysm Father   . Aneurysm Mother     brain  . Cancer Brother   . COPD Sister      Review of Systems:     Cardiac Review of Systems: Y or N  Chest Pain [  yes  ]  Resting SOB [ no  ] Exertional SOB  [place]er  ]  Orthopnea [no  ]   Pedal Edema [no   ]    Palpitations [no  ] Syncope  [ no ]   Presyncope [   ]  General Review of Systems: [Y] = yes [  ]=no Constitional: recent weight change [  ]; anorexia [  ]; fatigue [  ]; nausea [  ]; night sweats [  ]; fever [  ]; or chills [  ];                                                                                                                                          Dental: poor dentition[  ]; Last Dentist visit: 1 year no active complaints  Eye : blurred vision [  ]; diplopia [   ]; vision changes [  ];  Amaurosis fugax[  ]; Resp: cough [  ];  wheezing[yes  ];  hemoptysis[  ]; shortness of breath[yes  ]; paroxysmal nocturnal dyspnea[  ]; dyspnea on exertion[yes  ]; or orthopnea[  ];  GI:  gallstones[  ], vomiting[  ];  dysphagia[  ]; melena[  ];  hematochezia [  ]; heartburn[  ];   Hx of  Colonoscopy[  ]; GU: kidney stones [  ]; hematuria[  ];   dysuria [  ];  nocturia[  ];  history of     obstruction [  ];                 Skin: rash, swelling[  ];, hair loss[  ];  peripheral edema[  ];  or itching[  ]; Musculosketetal: myalgias[  ];  joint swelling[  ];  joint erythema[  ];  joint pain[  ];  back pain[  ];  Heme/Lymph: bruising[  ];  bleeding[  ];  anemia[  ];  Neuro: TIA[  ];  headaches[  ];  stroke[  ];  vertigo[  ];  seizures[  ];   paresthesias[  ];  difficulty walking[  yes];  Psych:depression[  ]; anxiety[  ];  Endocrine: diabetes[yes  ];  thyroid dysfunction[  ];  Immunizations: Flu [  ]; Pneumococcal[  ];  Other:  Physical Exam: BP 100/59  Pulse 53  Temp(Src) 97.7 F (36.5 C) (Oral)  Resp 19  Ht 5'  8" (1.727 m)  Wt 231 lb 11.3 oz (105.1 kg)  BMI 35.24 kg/m2  SpO2 98%  General appearance that of a obese 78 year old gentleman in the cath lab holding area HEENT normocephalic pupils equal Neck without JVD mass or bruit Chest with increased AP diameter scattered rhonchi Cardiac regular rhythm without murmur or gallop Abdomen soft obese nontender without pulsatile mass Extremities no cyanosis or edema or tenderness Vascular palpable pulses in the extremities, band on right wrist from cardiac cath site Neurologic no focal motor deficit, alert and oriented   Diagnostic Studies & Laboratory data:   PFTs, chest x-ray, coronary arteriograms are reviewed. Patient review Dr. Tamala Julian in the cardiac cath lab.  Recent Radiology Findings:   No results found.    Recent Lab Findings: Lab Results  Component Value Date   WBC 5.3 09/13/2012   HGB 11.7* 09/13/2012   HCT 33.5* 09/13/2012   PLT 242 09/13/2012   GLUCOSE 183* 09/01/2012   CHOL 187 10/03/2011   TRIG 189* 10/03/2011   HDL 48 10/03/2011   LDLCALC 101* 10/03/2011   ALT 12 08/31/2012   AST 17 08/31/2012   NA 136 09/01/2012   K 4.6 09/01/2012   CL 101 09/01/2012   CREATININE 1.31 09/13/2012   BUN 26* 09/01/2012   CO2 22 09/01/2012   INR 0.94 08/31/2012   HGBA1C 7.1* 10/03/2011      Assessment / Plan:      Patient would be an operative candidate for CABG but at increased risk due to his COPD. A percutaneous approach to his proximal circumflex would appear to be an appropriate plan with surgical backup. This was reviewed and explained to the patient and family and then discussed with Dr. Tamala Julian. Will follow.

## 2012-09-13 NOTE — Discharge Summary (Signed)
Patient ID: Marco Acevedo MRN: [id number] DOB/AGE: July 24, 1934 78 y.o.  Admit date: 09/12/2012 Discharge date: 09/13/2012 Patient Active Problem List   Diagnosis Date Noted  . Coronary atherosclerosis of native coronary artery 09/12/2012    Priority: High    Class: Acute  . Abnormal nuclear stress test 09/12/2012    Priority: High    Class: Acute  . Angina, class IV 09/12/2012    Priority: High    Class: Acute  . CKD (chronic kidney disease) stage 3, GFR 30-59 ml/min 09/13/2012    Priority: Medium    Class: Chronic  . Chest pain 09/01/2012  . Obesity, unspecified 09/01/2012  . Pure hypercholesterolemia 09/01/2012  . URI (upper respiratory infection) 03/05/2012  . Unstable angina 10/03/2011  . COPD (chronic obstructive pulmonary disease) 05/11/2011  . OSA (obstructive sleep apnea) 05/11/2011   Primary Discharge Diagnosis: Class 4 angina  Secondary Discharge Diagnosis: Chronic CAD with Cfx stenting x 2 CKD COPD OSA Obesity  Significant Diagnostic Studies: angiography:  Diagnostic Cardiac Catheterization Report  Marco Acevedo  78 y.o.  male  07-14-1934  Procedure Date: 09/12/2012  Referring Physician: Melinda Crutch, MD  Primary Cardiologist:: HWBSmith, [name], MD  PROCEDURE: Left heart catheterization with selective coronary angiography, left ventriculogram.  INDICATIONS: 78 year old gentleman with class IV angina pectoris. Abnormal nuclear study showing mild inferolateral ischemia.  The risks, benefits, and details of the procedure were explained to the patient. The patient verbalized understanding and wanted to proceed. Informed written consent was obtained.  PROCEDURE TECHNIQUE: After Xylocaine anesthesia a 5 French Slender sheath was placed in the right radial artery with a single anterior needle wall stick. Coronary angiography was done using a 5 [place] B2 MP and 5 [place] JR 4 catheter. Left ventriculography was done using a 5 [place] JR 4 Catheter by hand injection.    CONTRAST: Total of 105 cc.  COMPLICATIONS: None.  HEMODYNAMICS: Aortic pressure was 160/84 mmHg; LV pressure was 166/12 mmHg; LVEDP 17 mm mercury. There was no gradient between the left ventricle and aorta.  ANGIOGRAPHIC DATA: The left main coronary artery is calcified distally. Eccentric 20% narrowing is present..  The left anterior descending artery is large vessel patent around the left ventricular apex with multiple areas of irregularity up to 40% throughout the mid and distal vessel.  The left circumflex artery contains ostial calcification with 50-70% narrowing and proximal eccentric 95% stenosis. The continuation from this lesion to the mid vessel is diffusely diseased. The stent in the distal vessel is widely patent. There is 50% stenosis distal to the stent..  The right coronary artery is dominant and has irregularities up to 30-50% throughout the mid vessel. The distal vessel before the PDA contains an eccentric 50-70%.  LEFT VENTRICULOGRAM: Left ventricular angiogram was done in the 30 RAO projection and revealed normal left ventricular wall motion and systolic function with an estimated ejection fraction of 75 %.  IMPRESSIONS: 1. Class IV angina due to high-grade obstruction in the proximal circumflex. The circumflex lesion is complex because there is also moderately severe ostial disease with calcification as the vessel arises at a right angle from the left main LAD transition. The distal stent is patent with 50% stenosis at the distal margin.  2. Moderate distal RCA disease, 50-70% distal before the origin of the PDA.  3. Irregularities within the LAD but no high-grade obstruction.  4. No significant left main disease but eccentric calcified plaque noted at the bifurcation with the circumflex.  5. Hyperdynamic  left ventricular function  6. Stage [name] chronic renal insufficiency with creatinine of 1.54  RECOMMENDATION: 1. Will discuss treatment options which could include continuation of  medical therapy versus single-vessel CABG versus moderate to high risk PCI in this diabetic who has already had the circumflex stented twice in the past (but sites different than where the current severe disease is located).  2. We'll get TCTS consult with Dr. PVT  3. Hydration and reassess renal function in 48 hours.  4. If PCI, we'll perform from the right femoral approach rather than radial, as LAD protection may be necessary if we have to stent the ostium of the circumflex. This may even require a distal left main bifurcation technique crossing over the LAD.   Consults: TCTS Dr. Tharon Aquas [facility] Course:  He had diagnostic cath revealing right angle Cfx origin from LM, with calcificaton and 50-70 obstruction. Below the ostium in the proximal vessel, he has > 90 obstruction which is new compared to 09/2011. We discussed high risk nature of PCI at this site due to calcium, angle of origin, and eccentric plaque. Options offered were med therapy, PCI(high risk), or CABG. He was seen by Dr. Prescott Gum who felt surgery was an option but PCI was a consideration given co-morbidities. Patient does not want surgery but would accept if no other option. He is also potentially an emergency CABG candidate (but would be very high risk).  Creat prior to cath 1.58 decreasing to 1.31 six hours post procedure and increasing to 1.39 twelve hours post procedure.  After discussion, we will perform procedure on Monday AM 09/16/12 @7 :30A as a first case. Patient prefers to go home and return Monday AM. Discussed the higher risk with this approach and he is comfortable with this approach given the 2 month duration of his complaints. We will need an ISTAT Creat before the procedure on Monday.   Discharge Exam: Blood pressure 118/76, pulse 57, temperature 97.7 F (36.5 C), temperature source Oral, resp. rate 19, height 5\' 8"  (1.727 m), weight 105.1 kg (231 lb 11.3 oz), SpO2 96.00%.    Radial cath site  unremarkable.  Labs:   Lab Results  Component Value Date   WBC 5.3 09/13/2012   HGB 11.7* 09/13/2012   HCT 33.5* 09/13/2012   MCV 85.7 09/13/2012   PLT 242 09/13/2012     Recent Labs Lab 09/13/12 0540  NA 140  K 4.3  CL 104  CO2 23  BUN 19  CREATININE 1.39*  CALCIUM 9.0  GLUCOSE 136*   Lab Results  Component Value Date   CKTOTAL 68 10/03/2011   CKMB 2.9 10/03/2011   TROPONINI <0.30 09/01/2012    Lab Results  Component Value Date   CHOL 187 10/03/2011   Lab Results  Component Value Date   HDL 48 10/03/2011   Lab Results  Component Value Date   LDLCALC 101* 10/03/2011   Lab Results  Component Value Date   TRIG 189* 10/03/2011   Lab Results  Component Value Date   CHOLHDL 3.9 10/03/2011   No results found for this basename: LDLDIRECT      Radiology: No acute abnormality EKG: No acute abnormality  FOLLOW UP PLANS AND APPOINTMENTS      Future Appointments Provider Department Dept Phone   09/30/2012 9:00 AM Melvenia Needles, NP [facility] [phone]       Medication List    STOP taking these medications       lisinopril 10 MG  tablet  Commonly known as:  PRINIVIL,ZESTRIL     metFORMIN 1000 MG tablet  Commonly known as:  GLUCOPHAGE      TAKE these medications       allopurinol 300 MG tablet  Commonly known as:  ZYLOPRIM  Take 300 mg by mouth daily.     aspirin EC 81 MG tablet  Take 81 mg by mouth daily.     clopidogrel 75 MG tablet  Commonly known as:  PLAVIX  Take 75 mg by mouth daily.     finasteride 5 MG tablet  Commonly known as:  PROSCAR  Take 1 tablet (5 mg total) by mouth daily.     gabapentin 300 MG capsule  Commonly known as:  NEURONTIN  Take 300 mg by mouth 2 (two) times daily.     gemfibrozil 600 MG tablet  Commonly known as:  LOPID  Take 600 mg by mouth daily.     glyBURIDE 2.5 MG tablet  Commonly known as:  DIABETA  Take 2.5 mg by mouth daily with breakfast.     guaifenesin 400 MG Tabs  Commonly known as:   HUMIBID E  Take 400 mg by mouth daily as needed (for cough). For cough     Iron 325 (65 FE) MG Tabs  Take 1 tablet by mouth daily.     isosorbide mononitrate 60 MG 24 hr tablet  Commonly known as:  IMDUR  Take 120 mg by mouth daily.     metoprolol tartrate 25 MG tablet  Commonly known as:  LOPRESSOR  Take 1 tablet (25 mg total) by mouth 2 (two) times daily.     nitroGLYCERIN 0.4 MG SL tablet  Commonly known as:  NITROSTAT  Place 1 tablet (0.4 mg total) under the tongue every 5 (five) minutes x 3 doses as needed for chest pain.     omeprazole 20 MG capsule  Commonly known as:  PRILOSEC  Take 20 mg by mouth 2 (two) times daily before a meal.     pravastatin 40 MG tablet  Commonly known as:  PRAVACHOL  Take 40 mg by mouth daily.     primidone 50 MG tablet  Commonly known as:  MYSOLINE  Take 50 mg by mouth daily.     PROVENTIL HFA 108 (90 BASE) MCG/ACT inhaler  Generic drug:  albuterol  Inhale 2 puffs into the lungs every 6 (six) hours as needed.     albuterol (2.5 MG/3ML) 0.083% nebulizer solution  Commonly known as:  PROVENTIL  Take 2.5 mg by nebulization every 6 (six) hours as needed.     tiotropium 18 MCG inhalation capsule  Commonly known as:  SPIRIVA  Place 18 mcg into inhaler and inhale daily.     vitamin B-12 1000 MCG tablet  Commonly known as:  CYANOCOBALAMIN  Take 1,000 mcg by mouth daily.         BRING ALL MEDICATIONS WITH YOU TO FOLLOW UP APPOINTMENTS  Time spent with patient to include physician time: 20 min Signed: Sinclair Grooms 09/13/2012, 3:14 PM

## 2012-09-14 ENCOUNTER — Other Ambulatory Visit: Payer: Self-pay | Admitting: Interventional Cardiology

## 2012-09-16 ENCOUNTER — Encounter (HOSPITAL_COMMUNITY)
Admission: RE | Disposition: A | Discharge status: Home / Self Care | Payer: Self-pay | Source: Ambulatory Visit | Attending: Interventional Cardiology

## 2012-09-16 ENCOUNTER — Encounter (HOSPITAL_COMMUNITY): Payer: Self-pay

## 2012-09-16 ENCOUNTER — Inpatient Hospital Stay (HOSPITAL_COMMUNITY)
Admission: RE | Admit: 2012-09-16 | Discharge: 2012-09-18 | DRG: 247 | Disposition: A | Discharge status: Home / Self Care | Payer: Medicare Other | Source: Ambulatory Visit | Attending: Interventional Cardiology | Admitting: Interventional Cardiology

## 2012-09-16 DIAGNOSIS — E119 Type 2 diabetes mellitus without complications: Secondary | ICD-10-CM | POA: Diagnosis present

## 2012-09-16 DIAGNOSIS — E669 Obesity, unspecified: Secondary | ICD-10-CM | POA: Diagnosis present

## 2012-09-16 DIAGNOSIS — J449 Chronic obstructive pulmonary disease, unspecified: Secondary | ICD-10-CM | POA: Diagnosis present

## 2012-09-16 DIAGNOSIS — Z79899 Other long term (current) drug therapy: Secondary | ICD-10-CM

## 2012-09-16 DIAGNOSIS — N400 Enlarged prostate without lower urinary tract symptoms: Secondary | ICD-10-CM | POA: Diagnosis present

## 2012-09-16 DIAGNOSIS — Z87891 Personal history of nicotine dependence: Secondary | ICD-10-CM | POA: Diagnosis not present

## 2012-09-16 DIAGNOSIS — Z9861 Coronary angioplasty status: Secondary | ICD-10-CM

## 2012-09-16 DIAGNOSIS — I209 Angina pectoris, unspecified: Secondary | ICD-10-CM | POA: Diagnosis not present

## 2012-09-16 DIAGNOSIS — Z7982 Long term (current) use of aspirin: Secondary | ICD-10-CM

## 2012-09-16 DIAGNOSIS — N179 Acute kidney failure, unspecified: Secondary | ICD-10-CM | POA: Diagnosis present

## 2012-09-16 DIAGNOSIS — I129 Hypertensive chronic kidney disease with stage 1 through stage 4 chronic kidney disease, or unspecified chronic kidney disease: Secondary | ICD-10-CM | POA: Diagnosis present

## 2012-09-16 DIAGNOSIS — J4489 Other specified chronic obstructive pulmonary disease: Secondary | ICD-10-CM | POA: Diagnosis present

## 2012-09-16 DIAGNOSIS — Z7902 Long term (current) use of antithrombotics/antiplatelets: Secondary | ICD-10-CM | POA: Diagnosis not present

## 2012-09-16 DIAGNOSIS — N183 Chronic kidney disease, stage 3 unspecified: Secondary | ICD-10-CM | POA: Diagnosis not present

## 2012-09-16 DIAGNOSIS — R9439 Abnormal result of other cardiovascular function study: Secondary | ICD-10-CM | POA: Diagnosis not present

## 2012-09-16 DIAGNOSIS — I2584 Coronary atherosclerosis due to calcified coronary lesion: Secondary | ICD-10-CM | POA: Diagnosis present

## 2012-09-16 DIAGNOSIS — K219 Gastro-esophageal reflux disease without esophagitis: Secondary | ICD-10-CM | POA: Diagnosis present

## 2012-09-16 DIAGNOSIS — G4733 Obstructive sleep apnea (adult) (pediatric): Secondary | ICD-10-CM | POA: Diagnosis present

## 2012-09-16 DIAGNOSIS — E7800 Pure hypercholesterolemia, unspecified: Secondary | ICD-10-CM | POA: Diagnosis present

## 2012-09-16 DIAGNOSIS — E785 Hyperlipidemia, unspecified: Secondary | ICD-10-CM | POA: Diagnosis present

## 2012-09-16 DIAGNOSIS — M109 Gout, unspecified: Secondary | ICD-10-CM | POA: Diagnosis present

## 2012-09-16 DIAGNOSIS — Z955 Presence of coronary angioplasty implant and graft: Secondary | ICD-10-CM

## 2012-09-16 DIAGNOSIS — I2510 Atherosclerotic heart disease of native coronary artery without angina pectoris: Secondary | ICD-10-CM | POA: Diagnosis not present

## 2012-09-16 LAB — GLUCOSE, CAPILLARY
Glucose-Capillary: 134 mg/dL — ABNORMAL HIGH (ref 70–99)
Glucose-Capillary: 132 mg/dL — ABNORMAL HIGH (ref 70–99)

## 2012-09-16 LAB — BASIC METABOLIC PANEL
BUN: 35 mg/dL — ABNORMAL HIGH (ref 6–23)
CO2: 25 mEq/L (ref 19–32)
Chloride: 99 mEq/L (ref 96–112)
Glucose, Bld: 171 mg/dL — ABNORMAL HIGH (ref 70–99)
Potassium: 4.3 mEq/L (ref 3.5–5.1)

## 2012-09-16 SURGERY — PERCUTANEOUS CORONARY STENT INTERVENTION (PCI-S)
Anesthesia: LOCAL

## 2012-09-16 MED ORDER — ASPIRIN EC 81 MG PO TBEC
81.0000 mg | DELAYED_RELEASE_TABLET | Freq: Every day | ORAL | Status: DC
  Administered 2012-09-18: 10:00:00 81 mg via ORAL
  Filled 2012-09-16: qty 1

## 2012-09-16 MED ORDER — ALBUTEROL SULFATE (5 MG/ML) 0.5% IN NEBU: 2.5000 mg | INHALATION_SOLUTION | Freq: Four times a day (QID) | RESPIRATORY_TRACT | Status: DC | PRN

## 2012-09-16 MED ORDER — SIMVASTATIN 20 MG PO TABS: 20.0000 mg | ORAL_TABLET | Freq: Every day | ORAL | Status: DC

## 2012-09-16 MED ORDER — PRAVASTATIN SODIUM 40 MG PO TABS
40.0000 mg | ORAL_TABLET | Freq: Every day | ORAL | Status: DC
  Administered 2012-09-16 – 2012-09-17 (×2): 40 mg via ORAL
  Filled 2012-09-16 (×4): qty 1

## 2012-09-16 MED ORDER — CLOPIDOGREL BISULFATE 75 MG PO TABS: 300.0000 mg | ORAL_TABLET | ORAL | Status: DC

## 2012-09-16 MED ORDER — PRIMIDONE 50 MG PO TABS
50.0000 mg | ORAL_TABLET | Freq: Every day | ORAL | Status: DC
  Administered 2012-09-16 – 2012-09-17 (×2): 50 mg via ORAL
  Filled 2012-09-16 (×3): qty 1

## 2012-09-16 MED ORDER — FERROUS SULFATE 325 (65 FE) MG PO TABS
325.0000 mg | ORAL_TABLET | Freq: Every day | ORAL | Status: DC
  Administered 2012-09-16: 325 mg via ORAL
  Filled 2012-09-16 (×4): qty 1

## 2012-09-16 MED ORDER — FINASTERIDE 5 MG PO TABS: 5.0000 mg | ORAL_TABLET | Freq: Every day | ORAL | Status: DC

## 2012-09-16 MED ORDER — SODIUM CHLORIDE 0.9 % IV SOLN: 250.0000 mL | INTRAVENOUS | Status: DC | PRN

## 2012-09-16 MED ORDER — SODIUM CHLORIDE 0.9 % IV SOLN
INTRAVENOUS | Status: DC
  Administered 2012-09-16: 17:00:00 via INTRAVENOUS

## 2012-09-16 MED ORDER — CLOPIDOGREL BISULFATE 75 MG PO TABS: 75.0000 mg | ORAL_TABLET | Freq: Every day | ORAL | Status: DC

## 2012-09-16 MED ORDER — NITROGLYCERIN 0.4 MG SL SUBL: 0.4000 mg | SUBLINGUAL_TABLET | SUBLINGUAL | Status: DC | PRN

## 2012-09-16 MED ORDER — SODIUM CHLORIDE 0.9 % IJ SOLN: 3.0000 mL | Freq: Two times a day (BID) | INTRAMUSCULAR | Status: DC

## 2012-09-16 MED ORDER — DIAZEPAM 5 MG PO TABS
5.0000 mg | ORAL_TABLET | ORAL | Status: AC
  Administered 2012-09-16: 5 mg via ORAL

## 2012-09-16 MED ORDER — GABAPENTIN 300 MG PO CAPS: 300.0000 mg | ORAL_CAPSULE | Freq: Two times a day (BID) | ORAL | Status: DC

## 2012-09-16 MED ORDER — VITAMIN B-12 1000 MCG PO TABS
1000.0000 ug | ORAL_TABLET | Freq: Every day | ORAL | Status: DC
  Administered 2012-09-16 – 2012-09-18 (×3): 1000 ug via ORAL
  Filled 2012-09-16 (×4): qty 1

## 2012-09-16 MED ORDER — FINASTERIDE 5 MG PO TABS
5.0000 mg | ORAL_TABLET | Freq: Every day | ORAL | Status: DC
  Administered 2012-09-16 – 2012-09-18 (×3): 5 mg via ORAL
  Filled 2012-09-16 (×4): qty 1

## 2012-09-16 MED ORDER — GUAIFENESIN 400 MG PO TABS: 400.0000 mg | ORAL_TABLET | Freq: Every day | ORAL | Status: DC | PRN

## 2012-09-16 MED ORDER — PRIMIDONE 50 MG PO TABS: 50.0000 mg | ORAL_TABLET | Freq: Every day | ORAL | Status: DC

## 2012-09-16 MED ORDER — DIAZEPAM 5 MG PO TABS
ORAL_TABLET | ORAL | Status: AC
  Filled 2012-09-16: qty 1

## 2012-09-16 MED ORDER — SODIUM CHLORIDE 0.9 % IV SOLN
1.0000 mL/kg/h | INTRAVENOUS | Status: DC
  Administered 2012-09-17: 1 mL/kg/h via INTRAVENOUS

## 2012-09-16 MED ORDER — GABAPENTIN 300 MG PO CAPS
300.0000 mg | ORAL_CAPSULE | Freq: Two times a day (BID) | ORAL | Status: DC
  Administered 2012-09-16 – 2012-09-18 (×5): 300 mg via ORAL
  Filled 2012-09-16 (×8): qty 1

## 2012-09-16 MED ORDER — ALBUTEROL SULFATE HFA 108 (90 BASE) MCG/ACT IN AERS: 2.0000 | INHALATION_SPRAY | Freq: Four times a day (QID) | RESPIRATORY_TRACT | Status: DC | PRN

## 2012-09-16 MED ORDER — CLOPIDOGREL BISULFATE 75 MG PO TABS
300.0000 mg | ORAL_TABLET | ORAL | Status: AC
  Administered 2012-09-17: 300 mg via ORAL

## 2012-09-16 MED ORDER — ALLOPURINOL 300 MG PO TABS: 300.0000 mg | ORAL_TABLET | Freq: Every day | ORAL | Status: DC

## 2012-09-16 MED ORDER — FLUTICASONE PROPIONATE 50 MCG/ACT NA SUSP
2.0000 | Freq: Every day | NASAL | Status: DC
  Filled 2012-09-16 (×2): qty 16

## 2012-09-16 MED ORDER — METOPROLOL TARTRATE 25 MG PO TABS
25.0000 mg | ORAL_TABLET | Freq: Two times a day (BID) | ORAL | Status: DC
  Administered 2012-09-16 – 2012-09-18 (×4): 25 mg via ORAL
  Filled 2012-09-16 (×7): qty 1

## 2012-09-16 MED ORDER — GEMFIBROZIL 600 MG PO TABS
600.0000 mg | ORAL_TABLET | Freq: Every day | ORAL | Status: DC
  Administered 2012-09-16 – 2012-09-17 (×2): 600 mg via ORAL
  Filled 2012-09-16 (×4): qty 1

## 2012-09-16 MED ORDER — IRON 325 (65 FE) MG PO TABS: 1.0000 | ORAL_TABLET | Freq: Every day | ORAL | Status: DC

## 2012-09-16 MED ORDER — ASPIRIN EC 81 MG PO TBEC: 81.0000 mg | DELAYED_RELEASE_TABLET | Freq: Every day | ORAL | Status: DC

## 2012-09-16 MED ORDER — ISOSORBIDE MONONITRATE ER 60 MG PO TB24
120.0000 mg | ORAL_TABLET | Freq: Every day | ORAL | Status: DC
  Administered 2012-09-16 – 2012-09-18 (×3): 120 mg via ORAL
  Filled 2012-09-16 (×5): qty 2

## 2012-09-16 MED ORDER — ENOXAPARIN SODIUM 40 MG/0.4ML ~~LOC~~ SOLN
40.0000 mg | SUBCUTANEOUS | Status: DC
  Administered 2012-09-16: 40 mg via SUBCUTANEOUS
  Filled 2012-09-16 (×2): qty 0.4

## 2012-09-16 MED ORDER — ALLOPURINOL 300 MG PO TABS
300.0000 mg | ORAL_TABLET | Freq: Every day | ORAL | Status: DC
  Administered 2012-09-17 – 2012-09-18 (×2): 300 mg via ORAL
  Filled 2012-09-16 (×3): qty 1

## 2012-09-16 MED ORDER — ALBUTEROL SULFATE HFA 108 (90 BASE) MCG/ACT IN AERS
2.0000 | INHALATION_SPRAY | Freq: Four times a day (QID) | RESPIRATORY_TRACT | Status: DC | PRN
  Filled 2012-09-16: qty 6.7

## 2012-09-16 MED ORDER — ASPIRIN 81 MG PO CHEW
324.0000 mg | CHEWABLE_TABLET | ORAL | Status: AC
  Administered 2012-09-16 – 2012-09-17 (×2): 324 mg via ORAL

## 2012-09-16 MED ORDER — TIOTROPIUM BROMIDE MONOHYDRATE 18 MCG IN CAPS: 18.0000 ug | ORAL_CAPSULE | Freq: Every day | RESPIRATORY_TRACT | Status: DC

## 2012-09-16 MED ORDER — ISOSORBIDE MONONITRATE ER 60 MG PO TB24: 120.0000 mg | ORAL_TABLET | Freq: Every day | ORAL | Status: DC

## 2012-09-16 MED ORDER — GUAIFENESIN 200 MG PO TABS
400.0000 mg | ORAL_TABLET | Freq: Every day | ORAL | Status: DC | PRN
  Filled 2012-09-16 (×2): qty 2

## 2012-09-16 MED ORDER — SODIUM CHLORIDE 0.9 % IV SOLN: 1.0000 mL/kg/h | INTRAVENOUS | Status: DC

## 2012-09-16 MED ORDER — PANTOPRAZOLE SODIUM 40 MG PO TBEC
40.0000 mg | DELAYED_RELEASE_TABLET | Freq: Every day | ORAL | Status: DC
  Administered 2012-09-16 – 2012-09-17 (×2): 40 mg via ORAL
  Filled 2012-09-16 (×2): qty 1

## 2012-09-16 MED ORDER — GLYBURIDE 2.5 MG PO TABS: 2.5000 mg | ORAL_TABLET | Freq: Every day | ORAL | Status: DC

## 2012-09-16 MED ORDER — ASPIRIN 81 MG PO CHEW
CHEWABLE_TABLET | ORAL | Status: AC
  Filled 2012-09-16: qty 4

## 2012-09-16 MED ORDER — PRIMIDONE 50 MG PO TABS
50.0000 mg | ORAL_TABLET | Freq: Every day | ORAL | Status: DC
  Filled 2012-09-16: qty 1

## 2012-09-16 MED ORDER — DIAZEPAM 5 MG PO TABS: 5.0000 mg | ORAL_TABLET | ORAL | Status: DC

## 2012-09-16 MED ORDER — SODIUM CHLORIDE 0.9 % IJ SOLN: 3.0000 mL | INTRAMUSCULAR | Status: DC | PRN

## 2012-09-16 MED ORDER — SODIUM CHLORIDE 0.9 % IV SOLN
INTRAVENOUS | Status: DC
  Administered 2012-09-16: 06:00:00 via INTRAVENOUS

## 2012-09-16 MED ORDER — METOPROLOL TARTRATE 12.5 MG HALF TABLET: 25.0000 mg | ORAL_TABLET | Freq: Two times a day (BID) | ORAL | Status: DC

## 2012-09-16 MED ORDER — GEMFIBROZIL 600 MG PO TABS: 600.0000 mg | ORAL_TABLET | Freq: Every day | ORAL | Status: DC

## 2012-09-16 MED ORDER — VITAMIN B-12 1000 MCG PO TABS: 1000.0000 ug | ORAL_TABLET | Freq: Every day | ORAL | Status: DC

## 2012-09-16 MED ORDER — TIOTROPIUM BROMIDE MONOHYDRATE 18 MCG IN CAPS
18.0000 ug | ORAL_CAPSULE | Freq: Every day | RESPIRATORY_TRACT | Status: DC
  Administered 2012-09-16 – 2012-09-18 (×2): 18 ug via RESPIRATORY_TRACT
  Filled 2012-09-16 (×2): qty 5

## 2012-09-16 MED ORDER — CLOPIDOGREL BISULFATE 75 MG PO TABS
75.0000 mg | ORAL_TABLET | Freq: Every day | ORAL | Status: DC
  Administered 2012-09-16 – 2012-09-18 (×2): 75 mg via ORAL
  Filled 2012-09-16: qty 3
  Filled 2012-09-16 (×3): qty 1

## 2012-09-16 MED ORDER — GLYBURIDE 2.5 MG PO TABS
2.5000 mg | ORAL_TABLET | Freq: Every day | ORAL | Status: DC
  Filled 2012-09-16 (×2): qty 1

## 2012-09-16 MED ORDER — FLUTICASONE PROPIONATE 50 MCG/ACT NA SUSP: 2.0000 | Freq: Every day | NASAL | Status: DC

## 2012-09-16 MED ORDER — PANTOPRAZOLE SODIUM 40 MG PO TBEC: 40.0000 mg | DELAYED_RELEASE_TABLET | Freq: Every day | ORAL | Status: DC

## 2012-09-16 NOTE — H&P (Addendum)
Marco Acevedo is a 78 y.o. male  Admit date: 09/16/2012 Referring Physician: Melinda Crutch, MD Primary Cardiologist:: HWBSmth, [name], MD Chief complaint / reason for admission: Acute kidney injury/Class VI angina  HPI: The patient came in electively this morning for a first case PCI of a high risk circumflex stenosis. Preprocedure laboratory data demonstrated acute on chronic kidney injury with creatinine of 1.62 which is slightly higher than the pre-and diagnostic catheterization creatinine of 1.54 days ago. The patient was scheduled as a first case and there were no options for hydration and subsequently performed of the case later in the day. Since gone home on 09/13/2012 he has had minimal angina and is been sedentary. He has not needed to take nitroglycerin.    PMH:    Past Medical History  Diagnosis Date  . Diabetes mellitus type II   . Hyperlipidemia   . CAD (coronary artery disease)     no prior MI, PCI x 2 in 2009 and 02/2011 in [place]  . GERD (gastroesophageal reflux disease)   . BPH (benign prostatic hyperplasia)   . Granuloma annulare   . Gout   . Hypertension     PSH:    Past Surgical History  Procedure Laterality Date  . None    . Cardiac catheterization      ALLERGIES:   Review of patient's allergies indicates no known allergies.  Prior to Admit Meds:   Prescriptions prior to admission  Medication Sig Dispense Refill  . albuterol (PROVENTIL HFA) 108 (90 BASE) MCG/ACT inhaler Inhale 2 puffs into the lungs every 6 (six) hours as needed.      [name] albuterol (PROVENTIL) (2.5 MG/3ML) 0.083% nebulizer solution Take 2.5 mg by nebulization every 6 (six) hours as needed.      [name] allopurinol (ZYLOPRIM) 300 MG tablet Take 300 mg by mouth daily.      . clopidogrel (PLAVIX) 75 MG tablet Take 75 mg by mouth daily.      . Ferrous Sulfate (IRON) 325 (65 FE) MG TABS Take 1 tablet by mouth daily.      . finasteride (PROSCAR) 5 MG tablet Take 1 tablet (5 mg total) by mouth daily.  30  tablet  5  . fluticasone (FLONASE) 50 MCG/ACT nasal spray Place 2 sprays into the nose daily.      [name] gabapentin (NEURONTIN) 300 MG capsule Take 300 mg by mouth 2 (two) times daily.      [name] gemfibrozil (LOPID) 600 MG tablet Take 600 mg by mouth daily.      [name] glyBURIDE (DIABETA) 2.5 MG tablet Take 2.5 mg by mouth daily with breakfast.      . guaifenesin (HUMIBID E) 400 MG TABS Take 400 mg by mouth daily as needed (for cough). For cough      . isosorbide mononitrate (IMDUR) 60 MG 24 hr tablet Take 120 mg by mouth daily.      [name] lisinopril (PRINIVIL,ZESTRIL) 10 MG tablet Take 10 mg by mouth daily.      . metFORMIN (GLUCOPHAGE) 1000 MG tablet Take 1,000 mg by mouth 2 (two) times daily with a meal.      . metoprolol tartrate (LOPRESSOR) 25 MG tablet Take 1 tablet (25 mg total) by mouth 2 (two) times daily.  60 tablet  6  . nitroGLYCERIN (NITROSTAT) 0.4 MG SL tablet Place 1 tablet (0.4 mg total) under the tongue every 5 (five) minutes x 3 doses as needed for chest pain.  25 tablet  3  . omeprazole (PRILOSEC) 20 MG capsule Take 20 mg by mouth 2 (two) times daily before a meal.       . pravastatin (PRAVACHOL) 40 MG tablet Take 40 mg by mouth daily.      . primidone (MYSOLINE) 50 MG tablet Take 50 mg by mouth daily.       [name] tiotropium (SPIRIVA) 18 MCG inhalation capsule Place 18 mcg into inhaler and inhale daily.      . vitamin B-12 (CYANOCOBALAMIN) 1000 MCG tablet Take 1,000 mcg by mouth daily.      [name] aspirin EC 81 MG tablet Take 81 mg by mouth daily.       Family HX:    Family History  Problem Relation Age of Onset  . Aortic aneurysm Father   . Aneurysm Mother     brain  . Cancer Brother   . COPD Sister    Social HX:    History   Social History  . Marital Status: Married    Spouse Name: N/A    Number of Children: N/A  . Years of Education: N/A   Occupational History  . retired    Social History Main Topics  . Smoking status: Former Smoker -- 1.00 packs/day for 64 years    Types:  Cigarettes    Quit date: 05/29/2011  . Smokeless tobacco: Never Used  . Alcohol Use: Yes     Comment: rare  . Drug Use: No  . Sexually Active: Not Currently   Other Topics Concern  . Not on file   Social History Narrative  . No narrative on file    ROS: He is very upset that we cannot proceed with the procedure this morning. He denies chest pain. No radial access site complications since the procedure last week. He denies dyspnea appear  Physical Exam: Blood pressure 128/67, pulse 50, temperature 98 F (36.7 C), temperature source Oral, resp. rate 18, height 5\' 8"  (1.727 m), weight 104.781 kg (231 lb), SpO2 94.00%.    Obese, no acute distress, appearing his stated age  78 exam reveals no jaundice or pallor.  No significant JVD or carotid bruits.  Lungs are clear the bases. No wheezing is heard.  The cardiac exam reveals no gallop, rub, click, or murmur.  Abdominal exam reveals no liver spleen enlargement. Bowel sounds are normal.  Extremities reveal no edema. Femoral pulses are 2+. Radial pulses are 2+ .  Neurological exam is unremarkable.  Labs:   Lab Results  Component Value Date   WBC 5.3 09/13/2012   HGB 11.7* 09/13/2012   HCT 33.5* 09/13/2012   MCV 85.7 09/13/2012   PLT 242 09/13/2012    Recent Labs Lab 09/16/12 0559  NA 136  K 4.3  CL 99  CO2 25  BUN 35*  CREATININE 1.62*  CALCIUM 9.3  GLUCOSE 171*   Lab Results  Component Value Date   CKTOTAL 68 10/03/2011   CKMB 2.9 10/03/2011   TROPONINI <0.30 09/01/2012     Radiology:  No acute abnormality on 08/31/2012  EKG:  Not performed  ASSESSMENT:   1. Class IV angina pectoris due to high-grade high risk circumflex lesion that is scheduled to be intervened upon in a.m.  2. Acute on chronic renal insufficiency with a mild bump in creatinine since pre diagnostic catheterization result (1.52  increasing to 1.62).  3. COPD  4. Diabetes mellitus  Plan:  1. Considering the complexity of the patient's  anticipated PCI procedure and likely heavy  contrast used, the patient will be admitted to the hospital for hydration in preparation for the procedure which we have pushed back to second case tomorrow morning. All potential nephrotoxic agents will be held. He has been off of ACE inhibitor therapy now for 5 days.  2. The patient will undergo a high-risk PCI procedure tomorrow morning. There is ostial circumflex calcification and nonobstructive disease followed by a very eccentric proximal circumflex stenosis. The circumflex arises at a right angle from the left main. Wiring the circumflex will be difficult and could result in closure. Precise stent placement not to involve the ostium also be a potential complicating issue. The patient has been seen by Dr. Prescott Gum and could be operated upon if clinically indicated. The patient is not in favor of surgery unless there are no other options are we feel PCI to [facility] 09/16/2012 7:27 AM

## 2012-09-16 NOTE — Progress Notes (Signed)
ANTICOAGULATION CONSULT NOTE - Initial Consult  Pharmacy Consult for Lovenox Indication: VTE prophylaxis  No Known Allergies  Patient Measurements: Height: 5\' 8"  (172.7 cm) Weight: 231 lb (104.781 kg) IBW/kg (Calculated) : 68.4 Heparin Dosing Weight:    Vital Signs: Temp: 97.9 F (36.6 C) (06/09 1146) Temp src: Oral (06/09 1146) BP: 144/72 mmHg (06/09 1146) Pulse Rate: 52 (06/09 1146)  Labs:  Recent Labs  09/16/12 0559  CREATININE 1.62*    Estimated Creatinine Clearance: 46.3 ml/min (by C-G formula based on Cr of 1.62).   Medical History: Past Medical History  Diagnosis Date  . Diabetes mellitus type II   . Hyperlipidemia   . CAD (coronary artery disease)     no prior MI, PCI x 2 in 2009 and 02/2011 in [place]  . GERD (gastroesophageal reflux disease)   . BPH (benign prostatic hyperplasia)   . Granuloma annulare   . Gout   . Hypertension     Medications:  Prescriptions prior to admission  Medication Sig Dispense Refill  . albuterol (PROVENTIL HFA) 108 (90 BASE) MCG/ACT inhaler Inhale 2 puffs into the lungs every 6 (six) hours as needed.      [name] albuterol (PROVENTIL) (2.5 MG/3ML) 0.083% nebulizer solution Take 2.5 mg by nebulization every 6 (six) hours as needed.      [name] allopurinol (ZYLOPRIM) 300 MG tablet Take 300 mg by mouth daily.      . clopidogrel (PLAVIX) 75 MG tablet Take 75 mg by mouth daily.      . Ferrous Sulfate (IRON) 325 (65 FE) MG TABS Take 1 tablet by mouth daily.      . finasteride (PROSCAR) 5 MG tablet Take 1 tablet (5 mg total) by mouth daily.  30 tablet  5  . fluticasone (FLONASE) 50 MCG/ACT nasal spray Place 2 sprays into the nose daily.      [name] gabapentin (NEURONTIN) 300 MG capsule Take 300 mg by mouth 2 (two) times daily.      [name] gemfibrozil (LOPID) 600 MG tablet Take 600 mg by mouth daily.      [name] glyBURIDE (DIABETA) 2.5 MG tablet Take 2.5 mg by mouth daily with breakfast.      . guaifenesin (HUMIBID E) 400 MG TABS Take 400 mg by mouth daily  as needed (for cough). For cough      . isosorbide mononitrate (IMDUR) 60 MG 24 hr tablet Take 120 mg by mouth daily.      [name] lisinopril (PRINIVIL,ZESTRIL) 10 MG tablet Take 10 mg by mouth daily.      . metFORMIN (GLUCOPHAGE) 1000 MG tablet Take 1,000 mg by mouth 2 (two) times daily with a meal.      . metoprolol tartrate (LOPRESSOR) 25 MG tablet Take 1 tablet (25 mg total) by mouth 2 (two) times daily.  60 tablet  6  . nitroGLYCERIN (NITROSTAT) 0.4 MG SL tablet Place 1 tablet (0.4 mg total) under the tongue every 5 (five) minutes x 3 doses as needed for chest pain.  25 tablet  3  . omeprazole (PRILOSEC) 20 MG capsule Take 20 mg by mouth 2 (two) times daily before a meal.       . pravastatin (PRAVACHOL) 40 MG tablet Take 40 mg by mouth daily.      . primidone (MYSOLINE) 50 MG tablet Take 50 mg by mouth daily.       [name] tiotropium (SPIRIVA) 18 MCG inhalation capsule Place 18 mcg into inhaler and inhale daily.      [name]  vitamin B-12 (CYANOCOBALAMIN) 1000 MCG tablet Take 1,000 mcg by mouth daily.      [name] aspirin EC 81 MG tablet Take 81 mg by mouth daily.        Assessment: The patient came in electively this morning for a first case PCI of a high risk circumflex stenosis. Preprocedure laboratory data demonstrated acute on chronic kidney injury with creatinine of 1.62 which is slightly higher than the pre-and diagnostic catheterization creatinine of 1.54 days ago. Patient admitted for overnight hydration prior to PCI rescheduled for tomorrow.  Pharmacy asked to dose Lovenox for DVT prophylaxis. CrCl remains >30. Hgb slightly low at 11.7.  Goal of Therapy:  DVT prophylaxis dosing Monitor platelets by anticoagulation protocol: Yes   Plan:  Lovenox 40mg /day Hold tomorrow prior to cath.   Shaia Porath S. Alford Highland, PharmD, BCPS Clinical Staff Pharmacist Pager [phone]  Eilene Ghazi Stillinger 09/16/2012,12:46 PM

## 2012-09-17 ENCOUNTER — Ambulatory Visit (HOSPITAL_COMMUNITY)
Admission: RE | Admit: 2012-09-17 | Payer: Medicare Other | Source: Ambulatory Visit | Admitting: Interventional Cardiology

## 2012-09-17 ENCOUNTER — Other Ambulatory Visit: Payer: Self-pay

## 2012-09-17 ENCOUNTER — Encounter (HOSPITAL_COMMUNITY)
Admission: RE | Disposition: A | Discharge status: Home / Self Care | Payer: Self-pay | Source: Ambulatory Visit | Attending: Interventional Cardiology

## 2012-09-17 DIAGNOSIS — I209 Angina pectoris, unspecified: Secondary | ICD-10-CM | POA: Diagnosis not present

## 2012-09-17 DIAGNOSIS — I2510 Atherosclerotic heart disease of native coronary artery without angina pectoris: Secondary | ICD-10-CM | POA: Diagnosis not present

## 2012-09-17 DIAGNOSIS — N179 Acute kidney failure, unspecified: Secondary | ICD-10-CM | POA: Diagnosis not present

## 2012-09-17 DIAGNOSIS — R9439 Abnormal result of other cardiovascular function study: Secondary | ICD-10-CM | POA: Diagnosis not present

## 2012-09-17 HISTORY — PX: PERCUTANEOUS CORONARY STENT INTERVENTION (PCI-S): SHX5485

## 2012-09-17 LAB — POCT ACTIVATED CLOTTING TIME: Activated Clotting Time: 453 seconds

## 2012-09-17 LAB — CBC
HCT: 32.3 % — ABNORMAL LOW (ref 39.0–52.0)
MCHC: 34.1 g/dL (ref 30.0–36.0)
Platelets: 231 10*3/uL (ref 150–400)
RDW: 14 % (ref 11.5–15.5)
WBC: 5.1 10*3/uL (ref 4.0–10.5)

## 2012-09-17 LAB — GLUCOSE, CAPILLARY
Glucose-Capillary: 143 mg/dL — ABNORMAL HIGH (ref 70–99)
Glucose-Capillary: 139 mg/dL — ABNORMAL HIGH (ref 70–99)
Glucose-Capillary: 167 mg/dL — ABNORMAL HIGH (ref 70–99)

## 2012-09-17 LAB — BASIC METABOLIC PANEL
BUN: 30 mg/dL — ABNORMAL HIGH (ref 6–23)
Calcium: 9.2 mg/dL (ref 8.4–10.5)
Chloride: 107 mEq/L (ref 96–112)
Creatinine, Ser: 1.51 mg/dL — ABNORMAL HIGH (ref 0.50–1.35)
GFR calc Af Amer: 50 mL/min — ABNORMAL LOW (ref >90)
GFR calc non Af Amer: 43 mL/min — ABNORMAL LOW (ref >90)

## 2012-09-17 LAB — PROTIME-INR
INR: 1.02 (ref 0.00–1.49)
Prothrombin Time: 13.3 seconds (ref 11.6–15.2)

## 2012-09-17 SURGERY — PERCUTANEOUS CORONARY STENT INTERVENTION (PCI-S)
Anesthesia: LOCAL

## 2012-09-17 MED ORDER — FENTANYL CITRATE 0.05 MG/ML IJ SOLN
INTRAMUSCULAR | Status: AC
  Filled 2012-09-17: qty 2

## 2012-09-17 MED ORDER — LIDOCAINE HCL (PF) 1 % IJ SOLN
INTRAMUSCULAR | Status: AC
  Filled 2012-09-17: qty 30

## 2012-09-17 MED ORDER — SODIUM CHLORIDE 0.9 % IJ SOLN: 3.0000 mL | INTRAMUSCULAR | Status: DC | PRN

## 2012-09-17 MED ORDER — MIDAZOLAM HCL 2 MG/2ML IJ SOLN
INTRAMUSCULAR | Status: AC
  Filled 2012-09-17: qty 2

## 2012-09-17 MED ORDER — ONDANSETRON HCL 4 MG/2ML IJ SOLN: 4.0000 mg | Freq: Four times a day (QID) | INTRAMUSCULAR | Status: DC | PRN

## 2012-09-17 MED ORDER — NITROGLYCERIN IN D5W 200-5 MCG/ML-% IV SOLN
INTRAVENOUS | Status: AC
  Filled 2012-09-17: qty 250

## 2012-09-17 MED ORDER — DIAZEPAM 5 MG PO TABS
5.0000 mg | ORAL_TABLET | ORAL | Status: AC
  Administered 2012-09-17: 5 mg via ORAL
  Filled 2012-09-17: qty 1

## 2012-09-17 MED ORDER — NITROGLYCERIN IN D5W 200-5 MCG/ML-% IV SOLN
10.0000 ug/min | INTRAVENOUS | Status: AC
  Administered 2012-09-17: 10 ug/min via INTRAVENOUS
  Filled 2012-09-17: qty 250

## 2012-09-17 MED ORDER — ACETAMINOPHEN 325 MG PO TABS: 650.0000 mg | ORAL_TABLET | ORAL | Status: DC | PRN

## 2012-09-17 MED ORDER — SODIUM CHLORIDE 0.9 % IJ SOLN: 3.0000 mL | Freq: Two times a day (BID) | INTRAMUSCULAR | Status: DC

## 2012-09-17 MED ORDER — SODIUM CHLORIDE 0.9 % IV SOLN: 250.0000 mL | INTRAVENOUS | Status: DC | PRN

## 2012-09-17 MED ORDER — ASPIRIN 81 MG PO CHEW
324.0000 mg | CHEWABLE_TABLET | ORAL | Status: DC
  Filled 2012-09-17: qty 4

## 2012-09-17 MED ORDER — BIVALIRUDIN 250 MG IV SOLR
INTRAVENOUS | Status: AC
  Filled 2012-09-17: qty 250

## 2012-09-17 MED ORDER — HEPARIN (PORCINE) IN NACL 2-0.9 UNIT/ML-% IJ SOLN
INTRAMUSCULAR | Status: AC
  Filled 2012-09-17: qty 1500

## 2012-09-17 MED ORDER — OXYCODONE-ACETAMINOPHEN 5-325 MG PO TABS
1.0000 | ORAL_TABLET | ORAL | Status: DC | PRN
  Administered 2012-09-17: 2 via ORAL
  Filled 2012-09-17: qty 2

## 2012-09-17 MED ORDER — NITROGLYCERIN IN D5W 200-5 MCG/ML-% IV SOLN
5.0000 ug/min | INTRAVENOUS | Status: DC
  Administered 2012-09-17: 10 ug/min via INTRAVENOUS

## 2012-09-17 MED ORDER — SODIUM CHLORIDE 0.9 % IV SOLN: INTRAVENOUS | Status: DC

## 2012-09-17 NOTE — H&P (Signed)
  The patient is here to undergo PCI of the circumflex. He has renal insufficiency which as a delayed for procedure after initial diagnostic catheterization. He has had overnight hydration. His anatomy is high risk for complications due to right angle origin from the left main an eccentric nature of the coronary lesion. There is also mild to moderate calcium in the lesion and ostial moderate calcification in the circumflex origin which is near the lesion to be intervened upon. The high risk nature of the procedures been discussed with the patient is also surgical consultation by Dr. Lucianne Lei tried. Patient would be a candidate for surgery should intervention involving the left main or we have occlusion of the obtuse marginal.  The procedure and the risks of stroke, death, myocardial infarction, bleeding, limb ischemia, kidney failure, emergency CABG, among others were discussed in detail with the patient in previous discussions and reviewed again today. The patient is accepting of these possible risks and willing to proceed.

## 2012-09-17 NOTE — Progress Notes (Signed)
I was called to the holding area because the patient was complaining of jaw discomfort rated at 7 of 10 and 3 of 10 chest discomfort. I have instructed that IV nitroglycerin be started at 10 mics. EKG during pain revealed no ischemic changes. By the time I arrived the patient was sleeping and had no complaints. I suspect that he may be having spasm in a side branch that was crossed over with the stent or in the ostium of the circumflex. For the time being we'll manage this conservatively. We have already used more contrast and I would like given his renal function. The plan is hydration overnight. Sheath will be removed removed later on this morning.

## 2012-09-17 NOTE — CV Procedure (Signed)
   PERCUTANEOUS CORONARY INTERVENTION   Marco Acevedo is a 78 y.o. male  INDICATION: Class IV angina due to high-grade obstruction in the proximal circumflex coronary artery   PROCEDURE: Focal/spot stenting of the proximal circumflex with DES stent   CONSENT: The risks, benefits, and details of the procedure were explained to the patient. Risks including death, MI, stroke, bleeding, limb ischemia, renal failure and allergy were described and accepted by the patient.  Informed written consent was obtained prior to proceeding.  PROCEDURE TECHNIQUE:  After Xylocaine anesthesia a 6 French sheath was placed in the right femoral artery with a single anterior needle wall stick.   Coronary guiding shots were made using a 6 [place] CLS catheter. Antithrombotic therapy, Angiomax, was begun and determined to be therapeutic by ACT. Antiplatelet therapy, Plavix 300 mg, was loaded on top of chronic daily Plavix therapy.  Guiding shots were obtained and the procedure performed using an aside he pro-water wire that tracked nicely through the lesion without anticipated difficulty. We predilated with a 2.5 x 12 mm long Monorail balloon to 14 atmospheres. We positioned and deployed a 2.5 x 12 mm Promus Premier drug-eluting stent and deployed to 14 atmospheres which wasn't effective diameter of 2.75 mm. 2 balloon inflations were performed. We then had difficulty post-dilating with a 2.75 x 8 mm tract. A buddy wire using a BMW allowed the tract to be positioned and post dilatation was performed at 13 atmospheres. 200 mcg of intracoronary nitroglycerin was administered. Angiographic images were obtained with and without the wires in place. The result was felt to be adequate and the case terminated.  A sheath shot was performed and we attempted to place a Angio-Seal plug, however the device would not enter into the artery, possibly due to vessel calcification and a steep angle of entry. We therefore upgraded to a 7  French sheath and will remove the sheath with manual compression after Angiomax has abated.  CONTRAST:  Total of 215 cc.  COMPLICATIONS:  None.    ANGIOGRAPHIC RESULTS:   95% eccentric proximal circumflex obstruction reduced to 0% with TIMI grade 3 flow. Persistent residual 50-70% ostial stenosis was not treated.   IMPRESSIONS:  Successful proximal circumflex stenting with a 12 mm Promus Premier DES to effective diameter of 2.75 mm.   RECOMMENDATION:  Continue aggressive risk factor modification and medical therapy. IV hydration is mandatory because of renal insufficiency and the volume of contrast required for the case. Possible discharge in a.m.Marland KitchenMarland Kitchen    Sinclair Grooms, MD 09/17/2012 10:36 AM

## 2012-09-17 NOTE — Progress Notes (Signed)
Site area: right groin  Site Prior to Removal:  Level 0  Pressure Applied For 20 MINUTES    Minutes Beginning at 1340  Manual:   yes  Patient Status During Pull:  stable  Post Pull Groin Site:  Level 0  Post Pull Instructions Given:  yes  Post Pull Pulses Present:  yes  Dressing Applied:  yes  Comments:

## 2012-09-18 DIAGNOSIS — N179 Acute kidney failure, unspecified: Secondary | ICD-10-CM | POA: Diagnosis not present

## 2012-09-18 DIAGNOSIS — R9439 Abnormal result of other cardiovascular function study: Secondary | ICD-10-CM | POA: Diagnosis not present

## 2012-09-18 DIAGNOSIS — I2510 Atherosclerotic heart disease of native coronary artery without angina pectoris: Secondary | ICD-10-CM | POA: Diagnosis not present

## 2012-09-18 DIAGNOSIS — I209 Angina pectoris, unspecified: Secondary | ICD-10-CM | POA: Diagnosis not present

## 2012-09-18 LAB — CBC
Hemoglobin: 11.2 g/dL — ABNORMAL LOW (ref 13.0–17.0)
MCHC: 33.8 g/dL (ref 30.0–36.0)
RBC: 3.82 MIL/uL — ABNORMAL LOW (ref 4.22–5.81)

## 2012-09-18 LAB — BASIC METABOLIC PANEL
GFR calc non Af Amer: 41 mL/min — ABNORMAL LOW (ref >90)
Glucose, Bld: 157 mg/dL — ABNORMAL HIGH (ref 70–99)
Potassium: 4 mEq/L (ref 3.5–5.1)
Sodium: 140 mEq/L (ref 135–145)

## 2012-09-18 MED FILL — Sodium Chloride IV Soln 0.9%: INTRAVENOUS | Qty: 50 | Status: AC

## 2012-09-18 NOTE — Progress Notes (Signed)
CARDIAC REHAB PHASE I   PRE:  Rate/Rhythm: 52SB  BP:  Supine:   Sitting: 157/80  Standing:    SaO2: 95%RA  MODE:  Ambulation: 500 ft   POST:  Rate/Rhythm: 62SR  BP:  Supine:   Sitting: 172/80  Standing:    SaO2: 95%RA [id number] Pt walked 500 ft with steady gait. No CP. Tolerated well. Education completed with pt but he needs a lot of diet re enforcement. Tried to explain carb counting with pt but he had difficulty comprehending. Showed pt his menu and discussed how his carbs were being watched. Pt gave permission for CRP 2 referral to [place]. We have dietician who will work with pt in that dept. Gave diabetic and heart healthy diets. Pt did not want to go to out patient diabetic classes.   Graylon Good, RN BSN  09/18/2012 9:12 AM

## 2012-09-18 NOTE — Discharge Summary (Signed)
Patient ID: Marco Acevedo MRN: [id number] DOB/AGE: 1934/07/25 78 y.o.  Admit date: 09/16/2012 Discharge date: 09/18/2012 Patient Active Problem List   Diagnosis Date Noted  . Coronary atherosclerosis of native coronary artery 09/12/2012    Priority: High    Class: Acute  . Abnormal nuclear stress test 09/12/2012    Priority: High    Class: Acute  . Angina, class IV 09/12/2012    Priority: High    Class: Acute  . CKD (chronic kidney disease) stage 3, GFR 30-59 ml/min 09/13/2012    Priority: Medium    Class: Chronic  . Chest pain 09/01/2012  . Obesity, unspecified 09/01/2012  . Pure hypercholesterolemia 09/01/2012  . URI (upper respiratory infection) 03/05/2012  . Unstable angina 10/03/2011  . COPD (chronic obstructive pulmonary disease) 05/11/2011  . OSA (obstructive sleep apnea) 05/11/2011   Primary Discharge Diagnosis:  Class IV angina Secondary Discharge Diagnosis : Coronary artery disease with previous coronary stenting  Stage [name] chronic kidney disease  COPD  Hypertension  Significant Diagnostic Studies:  INDICATION: Class IV angina due to high-grade obstruction in the proximal circumflex coronary artery  PROCEDURE: Focal/spot stenting of the proximal circumflex with DES stent  CONSENT:  The risks, benefits, and details of the procedure were explained to the patient. Risks including death, MI, stroke, bleeding, limb ischemia, renal failure and allergy were described and accepted by the patient. Informed written consent was obtained prior to proceeding.  PROCEDURE TECHNIQUE: After Xylocaine anesthesia a 6 French sheath was placed in the right femoral artery with a single anterior needle wall stick. Coronary guiding shots were made using a 6 [place] CLS catheter. Antithrombotic therapy, Angiomax, was begun and determined to be therapeutic by ACT. Antiplatelet therapy, Plavix 300 mg, was loaded on top of chronic daily Plavix therapy.  Guiding shots were obtained and the  procedure performed using an aside he pro-water wire that tracked nicely through the lesion without anticipated difficulty. We predilated with a 2.5 x 12 mm long Monorail balloon to 14 atmospheres. We positioned and deployed a 2.5 x 12 mm Promus Premier drug-eluting stent and deployed to 14 atmospheres which wasn't effective diameter of 2.75 mm. 2 balloon inflations were performed. We then had difficulty post-dilating with a 2.75 x 8 mm tract. A buddy wire using a BMW allowed the tract to be positioned and post dilatation was performed at 13 atmospheres. 200 mcg of intracoronary nitroglycerin was administered. Angiographic images were obtained with and without the wires in place. The result was felt to be adequate and the case terminated.  A sheath shot was performed and we attempted to place a Angio-Seal plug, however the device would not enter into the artery, possibly due to vessel calcification and a steep angle of entry. We therefore upgraded to a 7 French sheath and will remove the sheath with manual compression after Angiomax has abated.  CONTRAST: Total of 215 cc.  COMPLICATIONS: None.  ANGIOGRAPHIC RESULTS: 95% eccentric proximal circumflex obstruction reduced to 0% with TIMI grade 3 flow. Persistent residual 50-70% ostial stenosis was not treated.  IMPRESSIONS: Successful proximal circumflex stenting with a 12 mm Promus Premier DES to effective diameter of 2.75 mm.  RECOMMENDATION: Continue aggressive risk factor modification and medical therapy. IV hydration is mandatory because of renal insufficiency and the volume of contrast required for the case. Possible discharge in a.m.Marland KitchenMarland Kitchen  Sinclair Grooms, MD  09/17/2012  10:36 AM   Consults: None  Hospital Course: The patient was hydrated overnight to  prevent contrast-induced nephropathy. PCI was successfully performed the following day. See the report above. On the following morning the creatinine was stable at 1.6 (admitting creatinine  1.62).   Discharge Exam: Blood pressure 150/77, pulse 53, temperature 97.5 F (36.4 C), temperature source Oral, resp. rate 16, height 5\' 8"  (1.727 m), weight 105.6 kg (232 lb 12.9 oz), SpO2 95.00%.   Cath site is unremarkable. Labs:   Lab Results  Component Value Date   WBC 6.0 09/18/2012   HGB 11.2* 09/18/2012   HCT 33.1* 09/18/2012   MCV 86.6 09/18/2012   PLT 224 09/18/2012    Recent Labs Lab 09/18/12 0522  NA 140  K 4.0  CL 106  CO2 24  BUN 21  CREATININE 1.60*  CALCIUM 8.8  GLUCOSE 157*   Lab Results  Component Value Date   CKTOTAL 68 10/03/2011   CKMB 2.9 10/03/2011   TROPONINI <0.30 09/01/2012    Lab Results  Component Value Date   CHOL 187 10/03/2011   Lab Results  Component Value Date   HDL 48 10/03/2011   Lab Results  Component Value Date   LDLCALC 101* 10/03/2011   Lab Results  Component Value Date   TRIG 189* 10/03/2011   Lab Results  Component Value Date   CHOLHDL 3.9 10/03/2011   No results found for this basename: LDLDIRECT      Radiology: No new data EKG: Normal  FOLLOW UP PLANS AND APPOINTMENTS  Future Appointments Provider Department Dept Phone   09/30/2012 9:00 AM Melvenia Needles, NP [facility] [phone]       Medication List    STOP taking these medications       lisinopril 10 MG tablet  Commonly known as:  PRINIVIL,ZESTRIL     metFORMIN 1000 MG tablet  Commonly known as:  GLUCOPHAGE      TAKE these medications       allopurinol 300 MG tablet  Commonly known as:  ZYLOPRIM  Take 300 mg by mouth daily.     aspirin EC 81 MG tablet  Take 81 mg by mouth daily.     clopidogrel 75 MG tablet  Commonly known as:  PLAVIX  Take 75 mg by mouth daily.     finasteride 5 MG tablet  Commonly known as:  PROSCAR  Take 1 tablet (5 mg total) by mouth daily.     fluticasone 50 MCG/ACT nasal spray  Commonly known as:  FLONASE  Place 2 sprays into the nose daily.     gabapentin 300 MG capsule  Commonly known as:   NEURONTIN  Take 300 mg by mouth 2 (two) times daily.     gemfibrozil 600 MG tablet  Commonly known as:  LOPID  Take 600 mg by mouth daily.     glyBURIDE 2.5 MG tablet  Commonly known as:  DIABETA  Take 2.5 mg by mouth daily with breakfast.     guaifenesin 400 MG Tabs  Commonly known as:  HUMIBID E  Take 400 mg by mouth daily as needed (for cough). For cough     Iron 325 (65 FE) MG Tabs  Take 1 tablet by mouth daily.     isosorbide mononitrate 60 MG 24 hr tablet  Commonly known as:  IMDUR  Take 120 mg by mouth daily.     metoprolol tartrate 25 MG tablet  Commonly known as:  LOPRESSOR  Take 1 tablet (25 mg total) by mouth 2 (two) times daily.     nitroGLYCERIN  0.4 MG SL tablet  Commonly known as:  NITROSTAT  Place 1 tablet (0.4 mg total) under the tongue every 5 (five) minutes x 3 doses as needed for chest pain.     omeprazole 20 MG capsule  Commonly known as:  PRILOSEC  Take 20 mg by mouth 2 (two) times daily before a meal.     pravastatin 40 MG tablet  Commonly known as:  PRAVACHOL  Take 40 mg by mouth daily.     primidone 50 MG tablet  Commonly known as:  MYSOLINE  Take 50 mg by mouth daily.     PROVENTIL HFA 108 (90 BASE) MCG/ACT inhaler  Generic drug:  albuterol  Inhale 2 puffs into the lungs every 6 (six) hours as needed.     albuterol (2.5 MG/3ML) 0.083% nebulizer solution  Commonly known as:  PROVENTIL  Take 2.5 mg by nebulization every 6 (six) hours as needed.     tiotropium 18 MCG inhalation capsule  Commonly known as:  SPIRIVA  Place 18 mcg into inhaler and inhale daily.     vitamin B-12 1000 MCG tablet  Commonly known as:  CYANOCOBALAMIN  Take 1,000 mcg by mouth daily.           Follow-up Information   Follow up with Sinclair Grooms, MD In 2 weeks. (Blood work friday morning. )    Contact information:   [place] [address] [phone]       BRING ALL MEDICATIONS WITH YOU TO FOLLOW UP  APPOINTMENTS  Time spent with patient to include physician time: 20 minutes Signed: Sinclair Grooms 09/18/2012, 7:18 AM

## 2012-09-30 ENCOUNTER — Ambulatory Visit: Payer: Medicare Other | Admitting: Adult Health

## 2012-10-02 DIAGNOSIS — J449 Chronic obstructive pulmonary disease, unspecified: Secondary | ICD-10-CM | POA: Diagnosis not present

## 2012-10-02 DIAGNOSIS — I2510 Atherosclerotic heart disease of native coronary artery without angina pectoris: Secondary | ICD-10-CM | POA: Diagnosis not present

## 2012-10-02 DIAGNOSIS — E119 Type 2 diabetes mellitus without complications: Secondary | ICD-10-CM | POA: Diagnosis not present

## 2012-10-02 DIAGNOSIS — I10 Essential (primary) hypertension: Secondary | ICD-10-CM | POA: Diagnosis not present

## 2012-10-02 DIAGNOSIS — I209 Angina pectoris, unspecified: Secondary | ICD-10-CM | POA: Diagnosis not present

## 2012-10-02 DIAGNOSIS — R0609 Other forms of dyspnea: Secondary | ICD-10-CM | POA: Diagnosis not present

## 2012-10-03 ENCOUNTER — Encounter: Payer: Self-pay | Admitting: Adult Health

## 2012-10-03 ENCOUNTER — Ambulatory Visit (INDEPENDENT_AMBULATORY_CARE_PROVIDER_SITE_OTHER): Payer: Medicare Other | Admitting: Adult Health

## 2012-10-03 VITALS — BP 114/72 | HR 47 | Temp 98.4°F | Ht 67.5 in | Wt 222.8 lb

## 2012-10-03 DIAGNOSIS — Z23 Encounter for immunization: Secondary | ICD-10-CM | POA: Diagnosis not present

## 2012-10-03 DIAGNOSIS — J449 Chronic obstructive pulmonary disease, unspecified: Secondary | ICD-10-CM | POA: Diagnosis not present

## 2012-10-03 NOTE — Progress Notes (Signed)
Subjective:    Patient ID: Marco Acevedo, male    DOB: May 19, 1934, 78 y.o.   MRN: [id number]  HPI  77/M,smoker , moved back to [place] from [place] , presents for fu of COPD & sleep related hypoxia.  Uses [facility] for meds  usess foradil 2-3/wk, lost nebuliser during move, atrovent HFA as needed  He also has CAD s/p stents x 2 on plavix, Htn on lisinopril & neuropathy  He had 1 episode of bronchitis in the last 6 months, denies wheezing or pedal edema, occasional cough with clear phlegm. He started smoking at age 66, retired from [employer]. He has gained 14 lbs in the last 2 years.  Spirometry showed moderate airway obstruction with FEV1 of 66% FVC of 71% and small airways at 55%.    2/13 PSg showed 25 min desatn < 88%, AHI was 5/h, REm 68 mins with REM related AHI 22/h  Significant PLMs were noted, he reports neuropathy & is on iron pills already   10/10/2011 Acute OV  Urgent care on 6/30 for increased dyspnea and wheezing >>Neb, solumedrol shot.  >> doxy, pred  Card Cath 6/13 with intermediate stent stenosis medical management with Imdur per cards  Lost 8 lbs Quit smoking 2/13 , gained wt but breathing has unchanged. has occasional cough w/ green-yellow phlem. denies any wheezing, chest tx. has not used his spiriva x1-2 weeks. stated it does not help. would like flu shot 4/13 ONO on RA showed only 5 min desatn < 88%  CAT score 11 Does not need oxygen  Did not want to ct pulm rehab - 'they treat [place] like kids'  03/05/2013 Acute OV  Complains of head congestion, sore throat, watery eyes, PND, dry cough x3-4days.  Complains of sinus pressure and drainage/sneezing.  Itching watery eyes.  Drainage is causing a lot of coughing.  Using otc nasal spray with some relief.  No fever, hemoptysis, chest pain  Or edema.  Remains on ACE inhibitor on b/p- denies chronic cough.  Has been doing much better up until last 4 days  Has been playing golf -very happy with his breathing since quitting smoking.   Has lost weight ~5 l/bs- as he has been more active.  Not using Spiriva -does not feel he needs it.  CAT score 9  Today .  >>zyrtec and mucinex .   10/03/2012 Follow up  6 month COPD follow up - reports still having some SOB, fatigue/drowsy x2-3 months.  CAT = 20 Has had been admitted x 3 over last month for with angina w/ cath showing CAD s/p stent complicated by renal insuffiency.  CXR on 08/31/12 showed NAD.  Over last 1 year does not feel breathing is getting any better.  Gets winded easily with minmal activity.  Tries to play golf once weekly, wears him out by last 2 holes.  Feels meds may be causing him to be tired. Imdur stopped by Dr. Tamala Julian yesterday. Not quite as tired today.  HR low 50s today on metoprolol 25mg  Twice daily   Going to cardiac rehab next week.  Quit pulmonary rehab b/c he did not feel it helped.  No orthopnea , chest pain , edema , cough or wheezing  Wt is down 10 lbs.      Review of Systems Constitutional:   No  weight loss, night sweats,  Fevers, chills,  +fatigue, or  lassitude.  HEENT:   No headaches,  Difficulty swallowing,  Tooth/dental problems, or  Sore throat,  No sneezing, itching, ear ache, nasal congestion, post nasal drip,   CV:  No chest pain,  Orthopnea, PND, swelling in lower extremities, anasarca, dizziness, palpitations, syncope.   GI  No heartburn, indigestion, abdominal pain, nausea, vomiting, diarrhea, change in bowel habits, loss of appetite, bloody stools.   Resp No coughing up of blood.  No change in color of mucus.  No wheezing.  No chest wall deformity  Skin: no rash or lesions.  GU: no dysuria, change in color of urine, no urgency or frequency.  No flank pain, no hematuria   MS:  No joint pain or swelling.  No decreased range of motion.  No back pain.  Psych:  No change in mood or affect. No depression or anxiety.  No memory loss.           Objective:   Physical Exam Gen. Pleasant, obese, in no  distress ENT - no lesions, clear nasal drainage  Neck: No JVD, no thyromegaly, no carotid bruits Lungs: no use of accessory muscles, no dullness to percussion, decreased without rales or rhonchi  Cardiovascular: Rhythm regular, heart sounds  normal, no murmurs or gallops, no peripheral edema Musculoskeletal: No deformities, no cyanosis or clubbing , no tremors      Assessment & Plan:

## 2012-10-03 NOTE — Addendum Note (Signed)
Addended by: Parke Poisson E on: 10/03/2012 10:44 AM   Modules accepted: Orders

## 2012-10-03 NOTE — Assessment & Plan Note (Addendum)
More symptomatic ? Related to recent cardiac issues. /med side effects  Does not have resp symptoms (no cough, congestion or wheezing)  Seems to be slowly improving  Can check spirometry on return if needed.  Discussed adding Advair -he declined-wants to see how he feels in cardiac rehab  Check cxr today   Plan  Pneumovax today  Continue on Spiriva 1 puff daily  Follow up for cardiac rehab as planned  Follow up Dr. Elsworth Soho  In 6 weeks and As needed

## 2012-10-03 NOTE — Patient Instructions (Addendum)
Pneumovax today  Continue on Spiriva 1 puff daily  Follow up for cardiac rehab as planned  Follow up Dr. Elsworth Soho  In 6 weeks and As needed

## 2012-10-09 ENCOUNTER — Other Ambulatory Visit: Payer: Self-pay | Admitting: Gastroenterology

## 2012-10-09 DIAGNOSIS — R1084 Generalized abdominal pain: Secondary | ICD-10-CM | POA: Diagnosis not present

## 2012-10-09 DIAGNOSIS — Z86010 Personal history of colonic polyps: Secondary | ICD-10-CM | POA: Diagnosis not present

## 2012-10-09 DIAGNOSIS — R109 Unspecified abdominal pain: Secondary | ICD-10-CM

## 2012-10-09 DIAGNOSIS — K219 Gastro-esophageal reflux disease without esophagitis: Secondary | ICD-10-CM | POA: Diagnosis not present

## 2012-10-09 DIAGNOSIS — R197 Diarrhea, unspecified: Secondary | ICD-10-CM | POA: Diagnosis not present

## 2012-10-09 DIAGNOSIS — R142 Eructation: Secondary | ICD-10-CM | POA: Diagnosis not present

## 2012-10-09 DIAGNOSIS — R141 Gas pain: Secondary | ICD-10-CM | POA: Diagnosis not present

## 2012-10-10 ENCOUNTER — Encounter (HOSPITAL_COMMUNITY)
Admission: RE | Admit: 2012-10-10 | Discharge: 2012-10-10 | Disposition: A | Discharge status: Home / Self Care | Payer: Medicare Other | Source: Ambulatory Visit | Attending: Interventional Cardiology | Admitting: Interventional Cardiology

## 2012-10-10 DIAGNOSIS — N183 Chronic kidney disease, stage 3 unspecified: Secondary | ICD-10-CM | POA: Insufficient documentation

## 2012-10-10 DIAGNOSIS — Z9861 Coronary angioplasty status: Secondary | ICD-10-CM | POA: Insufficient documentation

## 2012-10-10 DIAGNOSIS — I129 Hypertensive chronic kidney disease with stage 1 through stage 4 chronic kidney disease, or unspecified chronic kidney disease: Secondary | ICD-10-CM | POA: Insufficient documentation

## 2012-10-10 DIAGNOSIS — I2510 Atherosclerotic heart disease of native coronary artery without angina pectoris: Secondary | ICD-10-CM | POA: Insufficient documentation

## 2012-10-10 DIAGNOSIS — Z5189 Encounter for other specified aftercare: Secondary | ICD-10-CM | POA: Insufficient documentation

## 2012-10-10 NOTE — Progress Notes (Signed)
Cardiac Rehab Medication Review by a Pharmacist  Does the patient  feel that his/her medications are working for him/her?  yes  Has the patient been experiencing any side effects to the medications prescribed?  Pt. Has been experiencing GI issues, which he doesn't know if they are related in his medications, he will have GI work up later this month.  Does the patient measure his/her own blood pressure or blood glucose at home?  He checks his blood glucose at home, but he doesn't know how to check his blood pressure. I explained him the importance of blood pressure monitoring and recommended him to get a blood pressure monitor or checked regularly at a drug store.  Does the patient have any problems obtaining medications due to transportation or finances?   no  Understanding of regimen: fair Understanding of indications: good Potential of compliance: good    Maryanna Shape, PharmD, BCPS  Clinical Pharmacist  Pager: [phone]   10/10/2012 8:40 AM

## 2012-10-14 ENCOUNTER — Encounter (HOSPITAL_COMMUNITY): Admission: RE | Admit: 2012-10-14 | Payer: Medicare Other | Source: Ambulatory Visit

## 2012-10-16 ENCOUNTER — Encounter (HOSPITAL_COMMUNITY): Payer: Medicare Other

## 2012-10-17 ENCOUNTER — Ambulatory Visit
Admission: RE | Admit: 2012-10-17 | Discharge: 2012-10-17 | Disposition: A | Discharge status: Home / Self Care | Payer: Medicare Other | Source: Ambulatory Visit | Attending: Gastroenterology | Admitting: Gastroenterology

## 2012-10-17 DIAGNOSIS — K5730 Diverticulosis of large intestine without perforation or abscess without bleeding: Secondary | ICD-10-CM | POA: Diagnosis not present

## 2012-10-17 DIAGNOSIS — R109 Unspecified abdominal pain: Secondary | ICD-10-CM

## 2012-10-17 MED ORDER — IOHEXOL 300 MG/ML  SOLN
80.0000 mL | Freq: Once | INTRAMUSCULAR | Status: AC | PRN
  Administered 2012-10-17: 80 mL via INTRAVENOUS

## 2012-10-18 ENCOUNTER — Encounter (HOSPITAL_COMMUNITY): Payer: Medicare Other

## 2012-10-21 ENCOUNTER — Encounter (HOSPITAL_COMMUNITY): Payer: Medicare Other

## 2012-10-23 ENCOUNTER — Encounter (HOSPITAL_COMMUNITY): Payer: Medicare Other

## 2012-10-24 ENCOUNTER — Telehealth (HOSPITAL_COMMUNITY): Payer: Self-pay | Admitting: *Deleted

## 2012-10-25 ENCOUNTER — Encounter (HOSPITAL_COMMUNITY): Payer: Medicare Other

## 2012-10-28 ENCOUNTER — Encounter (HOSPITAL_COMMUNITY)
Admission: RE | Admit: 2012-10-28 | Discharge: 2012-10-28 | Disposition: A | Discharge status: Home / Self Care | Payer: Medicare Other | Source: Ambulatory Visit | Attending: Interventional Cardiology | Admitting: Interventional Cardiology

## 2012-10-28 DIAGNOSIS — I129 Hypertensive chronic kidney disease with stage 1 through stage 4 chronic kidney disease, or unspecified chronic kidney disease: Secondary | ICD-10-CM | POA: Diagnosis not present

## 2012-10-28 DIAGNOSIS — Z9861 Coronary angioplasty status: Secondary | ICD-10-CM | POA: Diagnosis not present

## 2012-10-28 DIAGNOSIS — I2510 Atherosclerotic heart disease of native coronary artery without angina pectoris: Secondary | ICD-10-CM | POA: Diagnosis not present

## 2012-10-28 DIAGNOSIS — N183 Chronic kidney disease, stage 3 unspecified: Secondary | ICD-10-CM | POA: Diagnosis not present

## 2012-10-28 DIAGNOSIS — Z5189 Encounter for other specified aftercare: Secondary | ICD-10-CM | POA: Diagnosis not present

## 2012-10-30 ENCOUNTER — Encounter (HOSPITAL_COMMUNITY)
Admission: RE | Admit: 2012-10-30 | Discharge: 2012-10-30 | Disposition: A | Discharge status: Home / Self Care | Payer: Medicare Other | Source: Ambulatory Visit | Attending: Interventional Cardiology | Admitting: Interventional Cardiology

## 2012-10-30 NOTE — Progress Notes (Signed)
Marco Acevedo's heart rate was noted at 49 on Monday prior to exercise nonsustained. Faxed ECG tracings to Dr Thompson Caul office for review.  Marco Acevedo complained of some shortness of breath on the treadmill this morning.  Marco Acevedo has a history of COPD and see's Dr Elsworth Soho. Walk test completed by Alberteen Sam EP.  Jessica faxed today's bike test to Dr Elsworth Soho for review. Marco Acevedo's oxygen saturations remained above 95% during bike testing.Will continue to monitor the patient throughout  the program.

## 2012-10-30 NOTE — Progress Notes (Signed)
Pt started cardiac rehab Monday.  Pt tolerated light exercise without difficulty.Telemetry Sinus Rhythm, Sinus brady.Vital signs stable. Will continue to monitor the patient throughout  the program.

## 2012-11-01 ENCOUNTER — Encounter (HOSPITAL_COMMUNITY): Payer: Medicare Other

## 2012-11-04 ENCOUNTER — Encounter (HOSPITAL_COMMUNITY)
Admission: RE | Admit: 2012-11-04 | Discharge: 2012-11-04 | Disposition: A | Discharge status: Home / Self Care | Payer: Medicare Other | Source: Ambulatory Visit | Attending: Interventional Cardiology | Admitting: Interventional Cardiology

## 2012-11-06 ENCOUNTER — Encounter (HOSPITAL_COMMUNITY): Payer: Medicare Other

## 2012-11-06 DIAGNOSIS — K644 Residual hemorrhoidal skin tags: Secondary | ICD-10-CM | POA: Diagnosis not present

## 2012-11-06 DIAGNOSIS — K219 Gastro-esophageal reflux disease without esophagitis: Secondary | ICD-10-CM | POA: Diagnosis not present

## 2012-11-06 DIAGNOSIS — K5730 Diverticulosis of large intestine without perforation or abscess without bleeding: Secondary | ICD-10-CM | POA: Diagnosis not present

## 2012-11-06 DIAGNOSIS — Z09 Encounter for follow-up examination after completed treatment for conditions other than malignant neoplasm: Secondary | ICD-10-CM | POA: Diagnosis not present

## 2012-11-06 DIAGNOSIS — D126 Benign neoplasm of colon, unspecified: Secondary | ICD-10-CM | POA: Diagnosis not present

## 2012-11-06 DIAGNOSIS — K648 Other hemorrhoids: Secondary | ICD-10-CM | POA: Diagnosis not present

## 2012-11-06 DIAGNOSIS — Z86010 Personal history of colonic polyps: Secondary | ICD-10-CM | POA: Diagnosis not present

## 2012-11-08 ENCOUNTER — Encounter (HOSPITAL_COMMUNITY)
Admission: RE | Admit: 2012-11-08 | Discharge: 2012-11-08 | Disposition: A | Discharge status: Home / Self Care | Payer: Medicare Other | Source: Ambulatory Visit | Attending: Interventional Cardiology | Admitting: Interventional Cardiology

## 2012-11-08 DIAGNOSIS — I2510 Atherosclerotic heart disease of native coronary artery without angina pectoris: Secondary | ICD-10-CM | POA: Insufficient documentation

## 2012-11-08 DIAGNOSIS — Z9861 Coronary angioplasty status: Secondary | ICD-10-CM | POA: Insufficient documentation

## 2012-11-08 DIAGNOSIS — I129 Hypertensive chronic kidney disease with stage 1 through stage 4 chronic kidney disease, or unspecified chronic kidney disease: Secondary | ICD-10-CM | POA: Insufficient documentation

## 2012-11-08 DIAGNOSIS — Z5189 Encounter for other specified aftercare: Secondary | ICD-10-CM | POA: Diagnosis not present

## 2012-11-08 DIAGNOSIS — N183 Chronic kidney disease, stage 3 unspecified: Secondary | ICD-10-CM | POA: Insufficient documentation

## 2012-11-08 NOTE — Progress Notes (Signed)
Reviewed home exercise with pt today.  Pt plans to walk at home and play golf for exercise.  Reviewed THR, pulse, RPE, sign and symptoms, NTG use, and when to call 911 or MD.  Spent a few minutes discussing why it is important to check pulse and learned how.  He got it right on the first try. Pt voiced understanding.  Pt refused to attend education classes at this time, stating that he doesn't retain anything new.  He did well one-on-one with home exercise and we will continue to check in with him on education interest in a one-on-one setting to see if he may be interested in hearing more information.  Alberteen Sam, MA, ACSM RCEP

## 2012-11-11 ENCOUNTER — Encounter (HOSPITAL_COMMUNITY): Payer: Medicare Other

## 2012-11-11 DIAGNOSIS — E782 Mixed hyperlipidemia: Secondary | ICD-10-CM | POA: Diagnosis not present

## 2012-11-11 DIAGNOSIS — I10 Essential (primary) hypertension: Secondary | ICD-10-CM | POA: Diagnosis not present

## 2012-11-11 DIAGNOSIS — J449 Chronic obstructive pulmonary disease, unspecified: Secondary | ICD-10-CM | POA: Diagnosis not present

## 2012-11-11 DIAGNOSIS — I209 Angina pectoris, unspecified: Secondary | ICD-10-CM | POA: Diagnosis not present

## 2012-11-11 DIAGNOSIS — I2510 Atherosclerotic heart disease of native coronary artery without angina pectoris: Secondary | ICD-10-CM | POA: Diagnosis not present

## 2012-11-13 ENCOUNTER — Encounter (HOSPITAL_COMMUNITY): Payer: Medicare Other

## 2012-11-15 ENCOUNTER — Encounter (HOSPITAL_COMMUNITY)
Admission: RE | Admit: 2012-11-15 | Discharge: 2012-11-15 | Disposition: A | Discharge status: Home / Self Care | Payer: Medicare Other | Source: Ambulatory Visit | Attending: Interventional Cardiology | Admitting: Interventional Cardiology

## 2012-11-15 DIAGNOSIS — Z5189 Encounter for other specified aftercare: Secondary | ICD-10-CM | POA: Diagnosis not present

## 2012-11-15 DIAGNOSIS — I129 Hypertensive chronic kidney disease with stage 1 through stage 4 chronic kidney disease, or unspecified chronic kidney disease: Secondary | ICD-10-CM | POA: Diagnosis not present

## 2012-11-15 DIAGNOSIS — I2510 Atherosclerotic heart disease of native coronary artery without angina pectoris: Secondary | ICD-10-CM | POA: Diagnosis not present

## 2012-11-15 DIAGNOSIS — Z9861 Coronary angioplasty status: Secondary | ICD-10-CM | POA: Diagnosis not present

## 2012-11-18 ENCOUNTER — Encounter (HOSPITAL_COMMUNITY)
Admission: RE | Admit: 2012-11-18 | Discharge: 2012-11-18 | Disposition: A | Discharge status: Home / Self Care | Payer: Medicare Other | Source: Ambulatory Visit | Attending: Interventional Cardiology | Admitting: Interventional Cardiology

## 2012-11-18 DIAGNOSIS — I2510 Atherosclerotic heart disease of native coronary artery without angina pectoris: Secondary | ICD-10-CM | POA: Diagnosis not present

## 2012-11-18 DIAGNOSIS — Z5189 Encounter for other specified aftercare: Secondary | ICD-10-CM | POA: Diagnosis not present

## 2012-11-18 DIAGNOSIS — I129 Hypertensive chronic kidney disease with stage 1 through stage 4 chronic kidney disease, or unspecified chronic kidney disease: Secondary | ICD-10-CM | POA: Diagnosis not present

## 2012-11-18 DIAGNOSIS — Z9861 Coronary angioplasty status: Secondary | ICD-10-CM | POA: Diagnosis not present

## 2012-11-20 ENCOUNTER — Encounter (HOSPITAL_COMMUNITY): Payer: Medicare Other

## 2012-11-21 ENCOUNTER — Ambulatory Visit: Payer: Medicare Other | Admitting: Pulmonary Disease

## 2012-11-22 ENCOUNTER — Encounter (HOSPITAL_COMMUNITY)
Admission: RE | Admit: 2012-11-22 | Discharge: 2012-11-22 | Disposition: A | Discharge status: Home / Self Care | Payer: Medicare Other | Source: Ambulatory Visit | Attending: Interventional Cardiology | Admitting: Interventional Cardiology

## 2012-11-22 DIAGNOSIS — Z5189 Encounter for other specified aftercare: Secondary | ICD-10-CM | POA: Diagnosis not present

## 2012-11-22 DIAGNOSIS — I2510 Atherosclerotic heart disease of native coronary artery without angina pectoris: Secondary | ICD-10-CM | POA: Diagnosis not present

## 2012-11-22 DIAGNOSIS — I129 Hypertensive chronic kidney disease with stage 1 through stage 4 chronic kidney disease, or unspecified chronic kidney disease: Secondary | ICD-10-CM | POA: Diagnosis not present

## 2012-11-22 DIAGNOSIS — Z9861 Coronary angioplasty status: Secondary | ICD-10-CM | POA: Diagnosis not present

## 2012-11-25 ENCOUNTER — Encounter (HOSPITAL_COMMUNITY)
Admission: RE | Admit: 2012-11-25 | Discharge: 2012-11-25 | Disposition: A | Discharge status: Home / Self Care | Payer: Medicare Other | Source: Ambulatory Visit | Attending: Interventional Cardiology | Admitting: Interventional Cardiology

## 2012-11-25 DIAGNOSIS — Z9861 Coronary angioplasty status: Secondary | ICD-10-CM | POA: Diagnosis not present

## 2012-11-25 DIAGNOSIS — I129 Hypertensive chronic kidney disease with stage 1 through stage 4 chronic kidney disease, or unspecified chronic kidney disease: Secondary | ICD-10-CM | POA: Diagnosis not present

## 2012-11-25 DIAGNOSIS — Z5189 Encounter for other specified aftercare: Secondary | ICD-10-CM | POA: Diagnosis not present

## 2012-11-25 DIAGNOSIS — I2510 Atherosclerotic heart disease of native coronary artery without angina pectoris: Secondary | ICD-10-CM | POA: Diagnosis not present

## 2012-11-27 ENCOUNTER — Encounter (HOSPITAL_COMMUNITY)
Admission: RE | Admit: 2012-11-27 | Discharge: 2012-11-27 | Disposition: A | Discharge status: Home / Self Care | Payer: Medicare Other | Source: Ambulatory Visit | Attending: Interventional Cardiology | Admitting: Interventional Cardiology

## 2012-11-27 DIAGNOSIS — I2510 Atherosclerotic heart disease of native coronary artery without angina pectoris: Secondary | ICD-10-CM | POA: Diagnosis not present

## 2012-11-27 DIAGNOSIS — I129 Hypertensive chronic kidney disease with stage 1 through stage 4 chronic kidney disease, or unspecified chronic kidney disease: Secondary | ICD-10-CM | POA: Diagnosis not present

## 2012-11-27 DIAGNOSIS — Z5189 Encounter for other specified aftercare: Secondary | ICD-10-CM | POA: Diagnosis not present

## 2012-11-27 DIAGNOSIS — Z9861 Coronary angioplasty status: Secondary | ICD-10-CM | POA: Diagnosis not present

## 2012-11-29 ENCOUNTER — Encounter (HOSPITAL_COMMUNITY): Payer: Medicare Other

## 2012-12-02 ENCOUNTER — Encounter (HOSPITAL_COMMUNITY)
Admission: RE | Admit: 2012-12-02 | Discharge: 2012-12-02 | Disposition: A | Discharge status: Home / Self Care | Payer: Medicare Other | Source: Ambulatory Visit | Attending: Interventional Cardiology | Admitting: Interventional Cardiology

## 2012-12-02 DIAGNOSIS — I129 Hypertensive chronic kidney disease with stage 1 through stage 4 chronic kidney disease, or unspecified chronic kidney disease: Secondary | ICD-10-CM | POA: Diagnosis not present

## 2012-12-02 DIAGNOSIS — I2510 Atherosclerotic heart disease of native coronary artery without angina pectoris: Secondary | ICD-10-CM | POA: Diagnosis not present

## 2012-12-02 DIAGNOSIS — Z5189 Encounter for other specified aftercare: Secondary | ICD-10-CM | POA: Diagnosis not present

## 2012-12-02 DIAGNOSIS — Z9861 Coronary angioplasty status: Secondary | ICD-10-CM | POA: Diagnosis not present

## 2012-12-04 ENCOUNTER — Encounter (HOSPITAL_COMMUNITY)
Admission: RE | Admit: 2012-12-04 | Discharge: 2012-12-04 | Disposition: A | Discharge status: Home / Self Care | Payer: Medicare Other | Source: Ambulatory Visit | Attending: Interventional Cardiology | Admitting: Interventional Cardiology

## 2012-12-04 DIAGNOSIS — I2510 Atherosclerotic heart disease of native coronary artery without angina pectoris: Secondary | ICD-10-CM | POA: Diagnosis not present

## 2012-12-04 DIAGNOSIS — I129 Hypertensive chronic kidney disease with stage 1 through stage 4 chronic kidney disease, or unspecified chronic kidney disease: Secondary | ICD-10-CM | POA: Diagnosis not present

## 2012-12-04 DIAGNOSIS — Z9861 Coronary angioplasty status: Secondary | ICD-10-CM | POA: Diagnosis not present

## 2012-12-04 DIAGNOSIS — Z5189 Encounter for other specified aftercare: Secondary | ICD-10-CM | POA: Diagnosis not present

## 2012-12-06 ENCOUNTER — Encounter (HOSPITAL_COMMUNITY): Payer: Medicare Other

## 2012-12-09 ENCOUNTER — Encounter (HOSPITAL_COMMUNITY): Payer: Medicare Other

## 2012-12-09 DIAGNOSIS — I2510 Atherosclerotic heart disease of native coronary artery without angina pectoris: Secondary | ICD-10-CM | POA: Insufficient documentation

## 2012-12-09 DIAGNOSIS — Z9861 Coronary angioplasty status: Secondary | ICD-10-CM | POA: Insufficient documentation

## 2012-12-09 DIAGNOSIS — N183 Chronic kidney disease, stage 3 unspecified: Secondary | ICD-10-CM | POA: Insufficient documentation

## 2012-12-09 DIAGNOSIS — Z5189 Encounter for other specified aftercare: Secondary | ICD-10-CM | POA: Insufficient documentation

## 2012-12-09 DIAGNOSIS — I129 Hypertensive chronic kidney disease with stage 1 through stage 4 chronic kidney disease, or unspecified chronic kidney disease: Secondary | ICD-10-CM | POA: Insufficient documentation

## 2012-12-11 ENCOUNTER — Encounter (HOSPITAL_COMMUNITY)
Admission: RE | Admit: 2012-12-11 | Discharge: 2012-12-11 | Disposition: A | Discharge status: Home / Self Care | Payer: Medicare Other | Source: Ambulatory Visit | Attending: Interventional Cardiology | Admitting: Interventional Cardiology

## 2012-12-11 DIAGNOSIS — Z5189 Encounter for other specified aftercare: Secondary | ICD-10-CM | POA: Diagnosis not present

## 2012-12-11 DIAGNOSIS — I129 Hypertensive chronic kidney disease with stage 1 through stage 4 chronic kidney disease, or unspecified chronic kidney disease: Secondary | ICD-10-CM | POA: Diagnosis not present

## 2012-12-11 DIAGNOSIS — Z9861 Coronary angioplasty status: Secondary | ICD-10-CM | POA: Diagnosis not present

## 2012-12-11 DIAGNOSIS — N183 Chronic kidney disease, stage 3 unspecified: Secondary | ICD-10-CM | POA: Diagnosis not present

## 2012-12-11 DIAGNOSIS — I2510 Atherosclerotic heart disease of native coronary artery without angina pectoris: Secondary | ICD-10-CM | POA: Diagnosis not present

## 2012-12-13 ENCOUNTER — Encounter (HOSPITAL_COMMUNITY): Payer: Medicare Other

## 2012-12-16 ENCOUNTER — Encounter (HOSPITAL_COMMUNITY): Payer: Medicare Other

## 2012-12-18 ENCOUNTER — Encounter (HOSPITAL_COMMUNITY): Payer: Medicare Other

## 2012-12-20 ENCOUNTER — Encounter (HOSPITAL_COMMUNITY): Payer: Medicare Other

## 2012-12-23 ENCOUNTER — Encounter (HOSPITAL_COMMUNITY)
Admission: RE | Admit: 2012-12-23 | Discharge: 2012-12-23 | Disposition: A | Discharge status: Home / Self Care | Payer: Medicare Other | Source: Ambulatory Visit | Attending: Interventional Cardiology | Admitting: Interventional Cardiology

## 2012-12-25 ENCOUNTER — Encounter (HOSPITAL_COMMUNITY): Payer: Medicare Other

## 2012-12-27 ENCOUNTER — Encounter (HOSPITAL_COMMUNITY): Payer: Medicare Other

## 2012-12-30 ENCOUNTER — Encounter (HOSPITAL_COMMUNITY)
Admission: RE | Admit: 2012-12-30 | Discharge: 2012-12-30 | Disposition: A | Discharge status: Home / Self Care | Payer: Medicare Other | Source: Ambulatory Visit | Attending: Interventional Cardiology | Admitting: Interventional Cardiology

## 2012-12-31 DIAGNOSIS — M76899 Other specified enthesopathies of unspecified lower limb, excluding foot: Secondary | ICD-10-CM | POA: Diagnosis not present

## 2013-01-01 ENCOUNTER — Encounter (HOSPITAL_COMMUNITY)
Admission: RE | Admit: 2013-01-01 | Discharge: 2013-01-01 | Disposition: A | Discharge status: Home / Self Care | Payer: Medicare Other | Source: Ambulatory Visit | Attending: Interventional Cardiology | Admitting: Interventional Cardiology

## 2013-01-01 NOTE — Progress Notes (Signed)
Marco Acevedo complained of having indigestion on the nustep today at cardiac rehab. Marco Acevedo rated the discomfort a 7 on a 1-10 scale. Telemetry rhythm Sinus tach blood pressure 144/80 then 138/78. Exercise stopped.  Marco Acevedo denied having actual chest pain. I faxed Marco Acevedo's ECG tracings for Dr Tamala Julian to review. Dr Tamala Julian reviewed Marco Acevedo's ECG and thought it looked unremarkable.  Marco Acevedo complaints of indigestion went away completely after rest.  Oxygen saturation  97% on room air. Marco Acevedo ate cream cheese smoked salmon raw onions and a bagel prior to coming to exercise. Marco Acevedo also received a steroid injection for his sciatica.  Marco Acevedo had no symptoms or complaints upon exit from cardiac rehab blood pressure 124/70. Will continue to monitor the patient throughout  the program.

## 2013-01-03 ENCOUNTER — Encounter (HOSPITAL_COMMUNITY): Payer: Medicare Other

## 2013-01-06 ENCOUNTER — Encounter (HOSPITAL_COMMUNITY): Payer: Medicare Other

## 2013-01-08 ENCOUNTER — Encounter (HOSPITAL_COMMUNITY): Payer: Medicare Other

## 2013-01-10 ENCOUNTER — Encounter (HOSPITAL_COMMUNITY): Payer: Medicare Other

## 2013-01-13 ENCOUNTER — Encounter (HOSPITAL_COMMUNITY)
Admission: RE | Admit: 2013-01-13 | Discharge: 2013-01-13 | Disposition: A | Discharge status: Home / Self Care | Payer: Medicare Other | Source: Ambulatory Visit | Attending: Interventional Cardiology | Admitting: Interventional Cardiology

## 2013-01-13 DIAGNOSIS — Z9861 Coronary angioplasty status: Secondary | ICD-10-CM | POA: Insufficient documentation

## 2013-01-13 DIAGNOSIS — N183 Chronic kidney disease, stage 3 unspecified: Secondary | ICD-10-CM | POA: Diagnosis not present

## 2013-01-13 DIAGNOSIS — I2510 Atherosclerotic heart disease of native coronary artery without angina pectoris: Secondary | ICD-10-CM | POA: Insufficient documentation

## 2013-01-13 DIAGNOSIS — Z5189 Encounter for other specified aftercare: Secondary | ICD-10-CM | POA: Diagnosis not present

## 2013-01-13 DIAGNOSIS — I129 Hypertensive chronic kidney disease with stage 1 through stage 4 chronic kidney disease, or unspecified chronic kidney disease: Secondary | ICD-10-CM | POA: Diagnosis not present

## 2013-01-13 NOTE — Progress Notes (Signed)
Patient reported having more chest pain than usual for the past three weeks intermittently.  Dr Thompson Caul office called and notified. I spoke with Huel Coventry Dr [facility] nurse.  Lisa notified Dr Tamala Julian about Marco Raya's complaints.  Dr Tamala Julian said he is okay with Marco Acevedo exercising this morning.  Lattie Haw gave Marco Acevedo and appointment to see Dr Tamala Julian on Monday October 20 th at 3:00pm per Dr Tamala Julian. Marco Acevedo does not have any chest pain presently and completed exercise without difficulty or complaints of chest pain. Vital signs stable. Will continue to monitor the patient throughout  the program. Marco Acevedo know to call Dr Thompson Caul office if necessary. Will continue to monitor the patient throughout  the program.

## 2013-01-15 ENCOUNTER — Encounter (HOSPITAL_COMMUNITY): Payer: Medicare Other

## 2013-01-17 ENCOUNTER — Encounter (HOSPITAL_COMMUNITY): Payer: Medicare Other

## 2013-01-20 ENCOUNTER — Encounter (HOSPITAL_COMMUNITY)
Admission: RE | Admit: 2013-01-20 | Discharge: 2013-01-20 | Disposition: A | Discharge status: Home / Self Care | Payer: Medicare Other | Source: Ambulatory Visit | Attending: Interventional Cardiology | Admitting: Interventional Cardiology

## 2013-01-22 ENCOUNTER — Encounter (HOSPITAL_COMMUNITY)
Admission: RE | Admit: 2013-01-22 | Discharge: 2013-01-22 | Disposition: A | Discharge status: Home / Self Care | Payer: Medicare Other | Source: Ambulatory Visit | Attending: Interventional Cardiology | Admitting: Interventional Cardiology

## 2013-01-22 LAB — GLUCOSE, CAPILLARY: Glucose-Capillary: 149 mg/dL — ABNORMAL HIGH (ref 70–99)

## 2013-01-24 ENCOUNTER — Encounter (HOSPITAL_COMMUNITY): Payer: Medicare Other

## 2013-01-27 ENCOUNTER — Encounter: Payer: Self-pay | Admitting: Interventional Cardiology

## 2013-01-27 ENCOUNTER — Encounter (HOSPITAL_COMMUNITY): Payer: Medicare Other

## 2013-01-27 ENCOUNTER — Ambulatory Visit (INDEPENDENT_AMBULATORY_CARE_PROVIDER_SITE_OTHER): Payer: Medicare Other | Admitting: Interventional Cardiology

## 2013-01-27 VITALS — BP 100/66 | HR 91 | Ht 68.0 in | Wt 224.0 lb

## 2013-01-27 DIAGNOSIS — I2510 Atherosclerotic heart disease of native coronary artery without angina pectoris: Secondary | ICD-10-CM

## 2013-01-27 DIAGNOSIS — R9439 Abnormal result of other cardiovascular function study: Secondary | ICD-10-CM

## 2013-01-27 DIAGNOSIS — J449 Chronic obstructive pulmonary disease, unspecified: Secondary | ICD-10-CM

## 2013-01-27 NOTE — Patient Instructions (Addendum)
*   Please make sure if yiou have servere chest pains to take Nitroglycerin 0.4 mg as directed.  Your physician wants you to follow-up in: 4 months with Dr. Tamala Julian. You will receive a reminder letter in the mail two months in advance. If you don't receive a letter, please call our office to schedule the follow-up appointment.

## 2013-01-27 NOTE — Progress Notes (Signed)
Patient ID: Marco Acevedo, male   DOB: 03-19-1935, 78 y.o.   MRN: [id number]    [address] [place], [place]  [address] Phone: [phone] Fax:  [phone]  Date:  01/27/2013   ID:  Marco Acevedo, DOB 1934-07-04, MRN [id number]  PCP:   Melinda Crutch, MD   ASSESSMENT:  1. Coronary atherosclerotic heart disease, with angina and a stable pattern  2. Hypertension  3. COPD  PLAN:  1. nitroglycerin use for episodes of pain that occurred at rest.  2. Continue healthy lifestyle with aerobic activity.   SUBJECTIVE: Marco Acevedo is a 78 y.o. male who has a history of prior CABG and who has multiple stents in the circumflex coronary artery most recently treated in June of 2014. At that time he was having angina with minimal exertion and prolonged episodes of pain. Since the intervention, he has been much improved and has no exertional angina. He is still bothered intermittently by episodes of chest discomfort at rest. These episodes last anywhere from 30 seconds to up to 5 minutes. He has not tried nitroglycerin for these episodes. He denies dyspnea. Since being in phase 2 cardiac rehabilitation his exertional tolerance has nearly tripled. He's playing golf 3 times per week and is not limited by angina. Overall he is markedly improved compared to earlier this summer.   Wt Readings from Last 3 Encounters:  01/27/13 224 lb (101.606 kg)  10/03/12 222 lb 12.8 oz (101.061 kg)  09/18/12 232 lb 12.9 oz (105.6 kg)     Past Medical History  Diagnosis Date  . Diabetes mellitus type II   . Hyperlipidemia   . CAD (coronary artery disease)     no prior MI, PCI x 2 in 2009 and 02/2011 in [place]  . GERD (gastroesophageal reflux disease)   . BPH (benign prostatic hyperplasia)   . Granuloma annulare   . Gout   . Hypertension     Current Outpatient Prescriptions  Medication Sig Dispense Refill  . albuterol (PROVENTIL HFA) 108 (90 BASE) MCG/ACT inhaler Inhale 2 puffs into the  lungs every 6 (six) hours as needed.      [name] albuterol (PROVENTIL) (2.5 MG/3ML) 0.083% nebulizer solution Take 2.5 mg by nebulization every 6 (six) hours as needed.      [name] allopurinol (ZYLOPRIM) 300 MG tablet Take 300 mg by mouth daily.      [name] aspirin EC 81 MG tablet Take 81 mg by mouth daily.      . clopidogrel (PLAVIX) 75 MG tablet Take 75 mg by mouth daily.      . Ferrous Sulfate (IRON) 325 (65 FE) MG TABS Take 1 tablet by mouth daily.      . finasteride (PROSCAR) 5 MG tablet Take 1 tablet (5 mg total) by mouth daily.  30 tablet  5  . gabapentin (NEURONTIN) 300 MG capsule Take 300 mg by mouth 2 (two) times daily.      [name] gemfibrozil (LOPID) 600 MG tablet Take 600 mg by mouth daily.      [name] glyBURIDE (DIABETA) 2.5 MG tablet Take 2.5 mg by mouth daily with breakfast.      . guaifenesin (HUMIBID E) 400 MG TABS Take 400 mg by mouth daily as needed (for cough). For cough      . metoprolol tartrate (LOPRESSOR) 25 MG tablet Take 1 tablet (25 mg total) by mouth 2 (two) times daily.  60 tablet  6  . omeprazole (PRILOSEC) 20 MG  capsule Take 20 mg by mouth 2 (two) times daily before a meal.       . pravastatin (PRAVACHOL) 40 MG tablet Take 40 mg by mouth daily.      . primidone (MYSOLINE) 50 MG tablet Take 50 mg by mouth at bedtime.       [name] tiotropium (SPIRIVA) 18 MCG inhalation capsule Place 18 mcg into inhaler and inhale daily.      . vitamin B-12 (CYANOCOBALAMIN) 1000 MCG tablet Take 1,000 mcg by mouth daily.      . nitroGLYCERIN (NITROSTAT) 0.4 MG SL tablet Place 1 tablet (0.4 mg total) under the tongue every 5 (five) minutes x 3 doses as needed for chest pain.  25 tablet  3   No current facility-administered medications for this visit.    Allergies:   No Known Allergies  Social History:  The patient  reports that he quit smoking about 20 months ago. His smoking use included Cigarettes. He has a 64 pack-year smoking history. He has never used smokeless tobacco. He reports that he drinks alcohol. He  reports that he does not use illicit drugs.   ROS:  Please see the history of present illness.   No blood in urine or stool. No neurological complaints. No wheezing or hemoptysis.   All other systems reviewed and negative.   OBJECTIVE: VS:  BP 100/66  Pulse 91  Ht 5\' 8"  (1.727 m)  Wt 224 lb (101.606 kg)  BMI 34.07 kg/m2  SpO2 94% Well nourished, well developed, in no acute distress, obese HEENT: normal Neck: JVD flat. Carotid bruit no bruits  Cardiac:  normal S1, S2; RRR; no murmur Lungs:  clear to auscultation bilaterally, no wheezing, rhonchi or rales Abd: soft, nontender, no hepatomegaly Ext: Edema none. Pulses 2+ and symmetric Skin: warm and dry Neuro:  CNs 2-12 intact, no focal abnormalities noted  EKG:  Not done       Signed, Illene Labrador [name], MD 01/27/2013 4:03 PM

## 2013-01-29 ENCOUNTER — Encounter (HOSPITAL_COMMUNITY)
Admission: RE | Admit: 2013-01-29 | Discharge: 2013-01-29 | Disposition: A | Discharge status: Home / Self Care | Payer: Medicare Other | Source: Ambulatory Visit | Attending: Interventional Cardiology | Admitting: Interventional Cardiology

## 2013-01-29 NOTE — Progress Notes (Signed)
Marco Acevedo exercised without complaints today. Will continue to monitor the patient throughout  the program.

## 2013-01-31 ENCOUNTER — Encounter (HOSPITAL_COMMUNITY): Payer: Medicare Other

## 2013-02-03 ENCOUNTER — Encounter (HOSPITAL_COMMUNITY)
Admission: RE | Admit: 2013-02-03 | Discharge: 2013-02-03 | Disposition: A | Discharge status: Home / Self Care | Payer: Medicare Other | Source: Ambulatory Visit | Attending: Interventional Cardiology | Admitting: Interventional Cardiology

## 2013-02-03 NOTE — Progress Notes (Signed)
Today at cardiac rehab pt reports he is so happy is he able to again play golf.  Pt reports that prior to beginning cardiac rehab he was not able to play golf and now he is able to play 18 holes twice a week.  Pt states "this has given me a whole new quality of life".

## 2013-02-03 NOTE — Progress Notes (Signed)
Marco Acevedo 78 y.o. male Nutrition Note Spoke with pt.  Nutrition Plan reviewed with pt. Pt declined to complete the MEDFICTS food frequency questionnaire. Pt states, "I'm too old to change all my eating habits." Per pt, his UBW was 206-208 lb before he quit smoking 05/2011. Pt wants to lose wt. Pt has been trying to lose wt by decreasing portion sizes. Pt reports his wt is 100 kg today, which is down 4.8 lbs since admission. Wt loss tips reviewed.  Pt is diabetic. Last A1c indicates fair blood glucose control. Per pt, he checks his CBG's "every 3-4 days." Fasting CBG's reportedly 130-160 mg/dL and post-prandial CBG's 175-236 mg/dL. This Probation officer went over Diabetes Education test results. Pt expressed understanding of the information reviewed. Pt aware of nutrition education classes offered and is unable to attend nutrition classes.  Nutrition Diagnosis   Food-and nutrition-related knowledge deficit related to lack of exposure to information as related to diagnosis of: ? CVD ? DM (A1c 7.1)   Obesity related to excessive energy intake as evidenced by a BMI of 34.3  Nutrition RX/ Estimated Daily Nutrition Needs for: wt loss  1400-1900 Kcal, 35-50 gm fat, 9-13 gm sat fat, 1.4-1.9 gm trans-fat, <1500 mg sodium, 175-250 gm CHO   Nutrition Intervention   Pt's individual nutrition plan reviewed with pt.   Pt to attend the Portion Distortion class and Diabetes Q & A class   Pt given handouts for: ? Nutrition I class ? Nutrition II class ? Diabetes Blitz class   Continue client-centered nutrition education by RD, as part of interdisciplinary care. Goal(s)   Pt to identify and limit food sources of saturated fat, trans fat, and cholesterol   Pt to identify food quantities necessary to achieve: ? wt loss to a goal wt of 200-218 lb (91.3-99.5 kg) at graduation from cardiac rehab.  Monitor and Evaluate progress toward nutrition goal with team. Nutrition Risk: Change to Moderate   Derek Mound, M.Ed, RD, LDN,  CDE 02/03/2013 11:07 AM

## 2013-02-07 ENCOUNTER — Encounter (HOSPITAL_COMMUNITY)
Admission: RE | Admit: 2013-02-07 | Discharge: 2013-02-07 | Disposition: A | Discharge status: Home / Self Care | Payer: Medicare Other | Source: Ambulatory Visit | Attending: Interventional Cardiology | Admitting: Interventional Cardiology

## 2013-02-10 ENCOUNTER — Encounter (HOSPITAL_COMMUNITY)
Admission: RE | Admit: 2013-02-10 | Discharge: 2013-02-10 | Disposition: A | Discharge status: Home / Self Care | Payer: Medicare Other | Source: Ambulatory Visit | Attending: Interventional Cardiology | Admitting: Interventional Cardiology

## 2013-02-10 ENCOUNTER — Other Ambulatory Visit: Payer: Self-pay

## 2013-02-10 DIAGNOSIS — Z9861 Coronary angioplasty status: Secondary | ICD-10-CM | POA: Insufficient documentation

## 2013-02-10 DIAGNOSIS — Z5189 Encounter for other specified aftercare: Secondary | ICD-10-CM | POA: Diagnosis not present

## 2013-02-10 DIAGNOSIS — I129 Hypertensive chronic kidney disease with stage 1 through stage 4 chronic kidney disease, or unspecified chronic kidney disease: Secondary | ICD-10-CM | POA: Diagnosis not present

## 2013-02-10 DIAGNOSIS — N183 Chronic kidney disease, stage 3 unspecified: Secondary | ICD-10-CM | POA: Insufficient documentation

## 2013-02-10 DIAGNOSIS — I2510 Atherosclerotic heart disease of native coronary artery without angina pectoris: Secondary | ICD-10-CM | POA: Diagnosis not present

## 2013-02-10 MED ORDER — NITROGLYCERIN 0.4 MG SL SUBL: 0.4000 mg | SUBLINGUAL_TABLET | SUBLINGUAL | Status: DC | PRN

## 2013-02-10 NOTE — Progress Notes (Signed)
[employer] graduates today.  Marco Acevedo plans to continue exercise on his own.

## 2013-02-12 DIAGNOSIS — E1142 Type 2 diabetes mellitus with diabetic polyneuropathy: Secondary | ICD-10-CM | POA: Diagnosis not present

## 2013-02-12 DIAGNOSIS — N183 Chronic kidney disease, stage 3 unspecified: Secondary | ICD-10-CM | POA: Diagnosis not present

## 2013-02-12 DIAGNOSIS — I10 Essential (primary) hypertension: Secondary | ICD-10-CM | POA: Diagnosis not present

## 2013-02-12 DIAGNOSIS — E782 Mixed hyperlipidemia: Secondary | ICD-10-CM | POA: Diagnosis not present

## 2013-02-12 DIAGNOSIS — Z23 Encounter for immunization: Secondary | ICD-10-CM | POA: Diagnosis not present

## 2013-02-12 DIAGNOSIS — E1149 Type 2 diabetes mellitus with other diabetic neurological complication: Secondary | ICD-10-CM | POA: Diagnosis not present

## 2013-02-12 DIAGNOSIS — M109 Gout, unspecified: Secondary | ICD-10-CM | POA: Diagnosis not present

## 2013-02-13 ENCOUNTER — Other Ambulatory Visit: Payer: Self-pay

## 2013-02-14 ENCOUNTER — Encounter (HOSPITAL_COMMUNITY): Payer: Medicare Other

## 2013-04-08 ENCOUNTER — Telehealth: Payer: Self-pay | Admitting: Interventional Cardiology

## 2013-04-08 NOTE — Telephone Encounter (Signed)
New message    [facility] and send [facility] note on why he takes it.Loann Quill Dr Heyat---fax [phone]  Have [facility] patient.

## 2013-04-09 NOTE — Telephone Encounter (Signed)
returned pt call advised pt that Dr.Smith is out of the office until mid Jan,2015, and I am off until 04/14/12. when I return to the office I will ask another physician to sign off on his Nitro written Rx and [facility].pt sts that he is not completely out, and is agreeable with plan.

## 2013-04-10 DIAGNOSIS — R197 Diarrhea, unspecified: Secondary | ICD-10-CM

## 2013-04-10 HISTORY — DX: Diarrhea, unspecified: R19.7

## 2013-04-15 ENCOUNTER — Other Ambulatory Visit: Payer: Self-pay

## 2013-04-15 MED ORDER — NITROGLYCERIN 0.4 MG SL SUBL: 0.4000 mg | SUBLINGUAL_TABLET | SUBLINGUAL | Status: DC | PRN

## 2013-04-15 NOTE — Telephone Encounter (Signed)
pt aware Rx for nitro and Dr.Smith last office note indicating medical need for nitro faxed to [facility] [place] attn: Dr.Heyat [phone]

## 2013-04-17 ENCOUNTER — Telehealth: Payer: Self-pay | Admitting: *Deleted

## 2013-04-17 NOTE — Telephone Encounter (Signed)
Patient called and wanted to speak to you and I made him aware that I was unable to transfer him due to you being in clinic. He is requesting that Dr Tamala Julian refer him to a new cardiologist due to him having trouble with his appointments. Thanks, MI

## 2013-04-17 NOTE — Telephone Encounter (Signed)
pt returned called. pt complains that he needed to change his 04/23/13 appt with Dr.Smith because of a family issue.pt sts that he was offered a March 2015 appt by scheduling or he could see a PA sooner.pt sts if that is the case he would like to switch cardiologist to one who has more availability.after looking at pt chart pt had already been scheduled to see Dr.Smith on 05/29/13.pt sts that he thought the appt was made for him to see a PA.I apologized to the pt for the inconvenience, explained that Dr.Smith was on vacation and with our new physician schedule he would not be in the office much the month of January. Pt expressed gratitude for the explanation and will keep upcoming appt.

## 2013-04-17 NOTE — Telephone Encounter (Signed)
returned pt call.unable to lmom phone rings and then makes and then goes into a busy signal

## 2013-04-23 ENCOUNTER — Ambulatory Visit: Payer: Medicare Other | Admitting: Interventional Cardiology

## 2013-04-23 ENCOUNTER — Telehealth: Payer: Self-pay | Admitting: *Deleted

## 2013-04-23 NOTE — Telephone Encounter (Signed)
[facility] and [facility]. He stated that it needs to be faxed to [phone] to the attention of Dr Hortencia Conradi and nurse Berneice Gandy with a brief description of why he needs this, and recent office notes. He also asked that you put the last four of his ss# which is [phone]. Thanks, MI

## 2013-04-24 ENCOUNTER — Other Ambulatory Visit: Payer: Self-pay

## 2013-04-24 MED ORDER — NITROGLYCERIN 0.4 MG SL SUBL: 0.4000 mg | SUBLINGUAL_TABLET | SUBLINGUAL | Status: DC | PRN

## 2013-04-24 NOTE — Telephone Encounter (Signed)
returned pt call. will refax nitro rx awith last o/v [facility]at/Rick Kinkaid fax [phone].pt aware

## 2013-04-25 ENCOUNTER — Telehealth: Payer: Self-pay

## 2013-04-28 NOTE — Telephone Encounter (Signed)
error 

## 2013-05-29 ENCOUNTER — Ambulatory Visit (INDEPENDENT_AMBULATORY_CARE_PROVIDER_SITE_OTHER): Payer: Medicare Other | Admitting: Interventional Cardiology

## 2013-05-29 ENCOUNTER — Ambulatory Visit: Payer: Medicare Other | Admitting: Interventional Cardiology

## 2013-05-29 ENCOUNTER — Encounter: Payer: Self-pay | Admitting: Interventional Cardiology

## 2013-05-29 VITALS — BP 130/62 | HR 100 | Ht 68.0 in | Wt 219.1 lb

## 2013-05-29 DIAGNOSIS — J449 Chronic obstructive pulmonary disease, unspecified: Secondary | ICD-10-CM | POA: Diagnosis not present

## 2013-05-29 DIAGNOSIS — E7800 Pure hypercholesterolemia, unspecified: Secondary | ICD-10-CM

## 2013-05-29 DIAGNOSIS — I2510 Atherosclerotic heart disease of native coronary artery without angina pectoris: Secondary | ICD-10-CM

## 2013-05-29 DIAGNOSIS — I10 Essential (primary) hypertension: Secondary | ICD-10-CM | POA: Diagnosis not present

## 2013-05-29 NOTE — Patient Instructions (Signed)
Your physician recommends that you continue on your current medications as directed. Please refer to the Current Medication list given to you today.  Your physician wants you to follow-up in: 6 months. You will receive a reminder letter in the mail two months in advance. If you don't receive a letter, please call our office to schedule the follow-up appointment.  

## 2013-05-29 NOTE — Progress Notes (Signed)
Patient ID: Marco Acevedo, male   DOB: 05-Jul-1934, 79 y.o.   MRN: [id number]    [address], [address] [place], [place]  [address] Phone: [phone] Fax:  [phone]  Date:  05/29/2013   ID:  Marco Acevedo, DOB [birth year], MRN [id number]  PCP:   Melinda Crutch, MD   ASSESSMENT:  1. Coronary artery disease, stable with minimal to no angina. 2. COPD, limits exertional tolerance 3. Hypertension, controlled 4. Hyperlipidemia on therapy  PLAN:  1. Encouraged that he continue exertional/aerobic activities as tolerated 2. He should call if any chest discomfort or need for multiple nitroglycerin tablets 3. Clinical followup in 6 months   SUBJECTIVE: Marco Acevedo is a 79 y.o. male who is doing well. Minimal to no angina. Still playing golf several times per week evening cold weather. He denies orthopnea and PND. Chronic lung disease causes dyspnea on exertion. No palpitations or syncope.   Wt Readings from Last 3 Encounters:  05/29/13 219 lb 1.9 oz (99.392 kg)  01/27/13 224 lb (101.606 kg)  10/03/12 222 lb 12.8 oz (101.061 kg)     Past Medical History  Diagnosis Date  . Diabetes mellitus type II   . Hyperlipidemia   . CAD (coronary artery disease)     no prior MI, PCI x 2 in 2009 and 02/2011 in [place]  . GERD (gastroesophageal reflux disease)   . BPH (benign prostatic hyperplasia)   . Granuloma annulare   . Gout   . Hypertension     Current Outpatient Prescriptions  Medication Sig Dispense Refill  . albuterol (PROVENTIL HFA) 108 (90 BASE) MCG/ACT inhaler Inhale 2 puffs into the lungs every 6 (six) hours as needed.      [name] albuterol (PROVENTIL) (2.5 MG/3ML) 0.083% nebulizer solution Take 2.5 mg by nebulization every 6 (six) hours as needed.      [name] allopurinol (ZYLOPRIM) 300 MG tablet Take 300 mg by mouth daily.      [name] aspirin EC 81 MG tablet Take 81 mg by mouth daily.      . clopidogrel (PLAVIX) 75 MG tablet Take 75 mg by mouth daily.      . Ferrous Sulfate (IRON) 325  (65 FE) MG TABS Take 1 tablet by mouth daily.      . finasteride (PROSCAR) 5 MG tablet Take 1 tablet (5 mg total) by mouth daily.  30 tablet  5  . fluticasone (VERAMYST) 27.5 MCG/SPRAY nasal spray Place 2 sprays into the nose 2 (two) times daily.      [name] gemfibrozil (LOPID) 600 MG tablet Take 600 mg by mouth daily.      [name] glyBURIDE (DIABETA) 2.5 MG tablet Take 5 mg by mouth daily with breakfast.       . guaifenesin (HUMIBID E) 400 MG TABS Take 400 mg by mouth daily as needed (for cough). For cough      . lisinopril (PRINIVIL,ZESTRIL) 10 MG tablet Take 10 mg by mouth daily.      . metFORMIN (GLUCOPHAGE) 1000 MG tablet Take 1,000 mg by mouth 2 (two) times daily with a meal.      . nitroGLYCERIN (NITROSTAT) 0.4 MG SL tablet Place 1 tablet (0.4 mg total) under the tongue every 5 (five) minutes x 3 doses as needed for chest pain.  25 tablet  5  . omeprazole (PRILOSEC) 20 MG capsule Take 20 mg by mouth 2 (two) times daily before a meal.       . pravastatin (PRAVACHOL)  40 MG tablet Take 40 mg by mouth daily.      . primidone (MYSOLINE) 50 MG tablet Take 50 mg by mouth at bedtime.       [name] tiotropium (SPIRIVA) 18 MCG inhalation capsule Place 18 mcg into inhaler and inhale daily.      . vitamin B-12 (CYANOCOBALAMIN) 1000 MCG tablet Take 1,000 mcg by mouth daily.       No current facility-administered medications for this visit.    Allergies:   No Known Allergies  Social History:  The patient  reports that he quit smoking about 2 years ago. His smoking use included Cigarettes. He has a 64 pack-year smoking history. He has never used smokeless tobacco. He reports that he drinks alcohol. He reports that he does not use illicit drugs.   ROS:  Please see the history of present illness.   Stable appetite. No episodes of syncope. No edema.   All other systems reviewed and negative.   OBJECTIVE: VS:  BP 130/62  Pulse 100  Ht 5\' 8"  (1.727 m)  Wt 219 lb 1.9 oz (99.392 kg)  BMI 33.32 kg/m2  SpO2 97% Well  nourished, well developed, in no acute distress, equivalent to his stated age 12: normal Neck: JVD flat. Carotid bruit absent  Cardiac:  normal S1, S2; RRR; no murmur Lungs:  clear to auscultation bilaterally, no wheezing, rhonchi or rales Abd: soft, nontender, no hepatomegaly Ext: Edema absent. Pulses 2+ Skin: warm and dry Neuro:  CNs 2-12 intact, no focal abnormalities noted  EKG:  Not repeated       Signed, Illene Labrador [name], MD 05/29/2013 11:18 AM

## 2013-07-16 DIAGNOSIS — M25569 Pain in unspecified knee: Secondary | ICD-10-CM | POA: Diagnosis not present

## 2013-08-07 DIAGNOSIS — M76899 Other specified enthesopathies of unspecified lower limb, excluding foot: Secondary | ICD-10-CM | POA: Diagnosis not present

## 2013-08-15 DIAGNOSIS — I10 Essential (primary) hypertension: Secondary | ICD-10-CM | POA: Diagnosis not present

## 2013-08-15 DIAGNOSIS — E1149 Type 2 diabetes mellitus with other diabetic neurological complication: Secondary | ICD-10-CM | POA: Diagnosis not present

## 2013-08-15 DIAGNOSIS — E782 Mixed hyperlipidemia: Secondary | ICD-10-CM | POA: Diagnosis not present

## 2013-08-15 DIAGNOSIS — M109 Gout, unspecified: Secondary | ICD-10-CM | POA: Diagnosis not present

## 2013-08-15 DIAGNOSIS — M1710 Unilateral primary osteoarthritis, unspecified knee: Secondary | ICD-10-CM | POA: Diagnosis not present

## 2013-08-15 DIAGNOSIS — R259 Unspecified abnormal involuntary movements: Secondary | ICD-10-CM | POA: Diagnosis not present

## 2013-08-18 ENCOUNTER — Encounter (HOSPITAL_BASED_OUTPATIENT_CLINIC_OR_DEPARTMENT_OTHER): Payer: Self-pay | Admitting: *Deleted

## 2013-08-18 ENCOUNTER — Other Ambulatory Visit: Payer: Self-pay | Admitting: Orthopaedic Surgery

## 2013-08-18 ENCOUNTER — Telehealth: Payer: Self-pay | Admitting: Interventional Cardiology

## 2013-08-18 DIAGNOSIS — I2510 Atherosclerotic heart disease of native coronary artery without angina pectoris: Secondary | ICD-10-CM

## 2013-08-18 DIAGNOSIS — M25569 Pain in unspecified knee: Secondary | ICD-10-CM | POA: Diagnosis not present

## 2013-08-18 NOTE — H&P (Signed)
Marco Acevedo is an 79 y.o. male.   Chief Complaint: Right knee pain HPI: Jehan is here again with his wife.  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  He mostly plays golf at this point.  His greatest problem is his right knee.  He cannot get going in terms of walking and has pain at rest.  The pain is intermittent and severe.  The first few steps are terribly painful and he feels something catch inside the joint.  We did give him an injection in this joint about a month ago.    MRI:  I reviewed an MRI scan films and report of a study done at the [facility] on 08/15/13.  This shows a large posterior horn medial meniscus tear with minimal degenerative change.  Past Medical History  Diagnosis Date  . Diabetes mellitus type II   . Hyperlipidemia   . CAD (coronary artery disease)     no prior MI, PCI x 2 in 2009 and 02/2011 in [place]  . GERD (gastroesophageal reflux disease)   . BPH (benign prostatic hyperplasia)   . Granuloma annulare   . Gout   . Hypertension   . Wears dentures     top  . Wears glasses     Past Surgical History  Procedure Laterality Date  . None    . Cardiac catheterization  2009,2012  . Colonoscopy    . Lipoma excision    . Eye surgery      both cataracts  . Blepharoplasty      Family History  Problem Relation Age of Onset  . Aortic aneurysm Father   . Aneurysm Mother     brain  . Cancer Brother   . COPD Sister    Social History:  reports that he quit smoking about 2 years ago. His smoking use included Cigarettes. He has a 64 pack-year smoking history. He has never used smokeless tobacco. He reports that he drinks alcohol. He reports that he does not use illicit drugs.  Allergies: No Known Allergies  No prescriptions prior to admission    No results found for this or any previous visit (from the past 48 hour(s)). No results found.  Review of Systems  Musculoskeletal: Positive for joint pain.       Right  knee  All other systems reviewed and are negative.   Height 5\' 8"  (1.727 m), weight 98.884 kg (218 lb). Physical Exam  Constitutional: He is oriented to person, place, and time. He appears well-developed and well-nourished.  HENT:  Head: Normocephalic and atraumatic.  Eyes: Conjunctivae and EOM are normal. Pupils are equal, round, and reactive to light.  Neck: Normal range of motion.  Cardiovascular: Normal rate and normal heart sounds.   Respiratory: Effort normal.  GI: Soft.  Musculoskeletal:  Right knee has trace effusion and no scars.  His motion is from 0-120 at which point he has some terrible pain.  There is medial joint line pain to palpation and his McMurray's test is positive for pain and a pop.  Ligament exam is stable.   Hip motion is full and pain free and SLR is negative on both sides.  There is no palpable LAD behind either knee.  Sensation and motor function are intact on both sides and there are palpable pulses on both sides.    Neurological: He is alert and oriented to person, place, and time.  Skin: Skin  is warm and dry.  Psychiatric: He has a normal mood and affect. His behavior is normal. Judgment and thought content normal.     Assessment/Plan Assessment:   Right knee torn medial meniscus injected 07/16/13  Plan: Shavez has terrible pain in his knee which limits his ability to golf and rest.  We injected him with at most transient success.  By MRI scan he has a displaced medial meniscus tear.  I think we can make him better with a knee arthroscopy. I reviewed risks of anesthesia and infection as well as potential for DVT related to a knee arthroscopy.  I've stressed the importance of some postoperative physical therapy to optimize results and we will try to set up an appointment.  Two to four  weeks for recovery would be typical but that is a little variable.    Larwance Sachs Triva Hueber 08/18/2013, 3:02 PM

## 2013-08-18 NOTE — Telephone Encounter (Signed)
pt needs clearance for ARTHROSCOPY RIGHT KNEE scheduled with Dr.Dalldorf. will fwd to [place] for approval

## 2013-08-18 NOTE — Telephone Encounter (Signed)
New message      Want dr to know Dr Rhona Raider is doing a 35min knee scope tomorrow---can you clear him for this? Fax clearance to [phone]

## 2013-08-18 NOTE — Progress Notes (Signed)
08/18/13 1134  OBSTRUCTIVE SLEEP APNEA  Have you ever been diagnosed with sleep apnea through a sleep study? Yes (mild-no cpcp)  If yes, do you have and use a CPAP or BPAP machine every night? 0  Do you snore loudly (loud enough to be heard through closed doors)?  1  Do you often feel tired, fatigued, or sleepy during the daytime? 0  Has anyone observed you stop breathing during your sleep? 0  Do you have, or are you being treated for high blood pressure? 1  BMI more than 35 kg/m2? 0  Age over 75 years old? 1  Neck circumference greater than 40 cm/16 inches? 1  Gender: 1  Obstructive Sleep Apnea Score 5  Score 4 or greater  Results sent to PCP

## 2013-08-18 NOTE — Progress Notes (Signed)
Reviewed notes with dr Rhona Raider with surgery, but let dr h Tamala Julian know and is her ok for clearance.-called office

## 2013-08-18 NOTE — Progress Notes (Signed)
Pt came in for add on from office-on plavix-cardiac stents-dr smith-saw him 2/15-doning well-just had labs dr ross-called for labs and notes

## 2013-08-19 ENCOUNTER — Encounter (HOSPITAL_BASED_OUTPATIENT_CLINIC_OR_DEPARTMENT_OTHER): Payer: Medicare Other | Admitting: Anesthesiology

## 2013-08-19 ENCOUNTER — Encounter (HOSPITAL_BASED_OUTPATIENT_CLINIC_OR_DEPARTMENT_OTHER): Payer: Self-pay | Admitting: *Deleted

## 2013-08-19 ENCOUNTER — Ambulatory Visit (HOSPITAL_BASED_OUTPATIENT_CLINIC_OR_DEPARTMENT_OTHER)
Admission: RE | Admit: 2013-08-19 | Discharge: 2013-08-19 | Disposition: A | Discharge status: Home / Self Care | Payer: Medicare Other | Source: Ambulatory Visit | Attending: Orthopaedic Surgery | Admitting: Orthopaedic Surgery

## 2013-08-19 ENCOUNTER — Ambulatory Visit (HOSPITAL_BASED_OUTPATIENT_CLINIC_OR_DEPARTMENT_OTHER): Payer: Medicare Other | Admitting: Anesthesiology

## 2013-08-19 ENCOUNTER — Encounter (HOSPITAL_BASED_OUTPATIENT_CLINIC_OR_DEPARTMENT_OTHER)
Admission: RE | Disposition: A | Discharge status: Home / Self Care | Payer: Self-pay | Source: Ambulatory Visit | Attending: Orthopaedic Surgery

## 2013-08-19 DIAGNOSIS — M23329 Other meniscus derangements, posterior horn of medial meniscus, unspecified knee: Secondary | ICD-10-CM | POA: Insufficient documentation

## 2013-08-19 DIAGNOSIS — IMO0002 Reserved for concepts with insufficient information to code with codable children: Secondary | ICD-10-CM | POA: Diagnosis not present

## 2013-08-19 DIAGNOSIS — Z6832 Body mass index (BMI) 32.0-32.9, adult: Secondary | ICD-10-CM | POA: Diagnosis not present

## 2013-08-19 DIAGNOSIS — M2240 Chondromalacia patellae, unspecified knee: Secondary | ICD-10-CM | POA: Diagnosis not present

## 2013-08-19 DIAGNOSIS — M109 Gout, unspecified: Secondary | ICD-10-CM | POA: Diagnosis not present

## 2013-08-19 DIAGNOSIS — E785 Hyperlipidemia, unspecified: Secondary | ICD-10-CM | POA: Diagnosis not present

## 2013-08-19 DIAGNOSIS — E119 Type 2 diabetes mellitus without complications: Secondary | ICD-10-CM | POA: Diagnosis not present

## 2013-08-19 DIAGNOSIS — I10 Essential (primary) hypertension: Secondary | ICD-10-CM | POA: Insufficient documentation

## 2013-08-19 DIAGNOSIS — I2510 Atherosclerotic heart disease of native coronary artery without angina pectoris: Secondary | ICD-10-CM | POA: Insufficient documentation

## 2013-08-19 DIAGNOSIS — M9420 Chondromalacia, unspecified site: Secondary | ICD-10-CM | POA: Diagnosis not present

## 2013-08-19 DIAGNOSIS — M6750 Plica syndrome, unspecified knee: Secondary | ICD-10-CM | POA: Diagnosis not present

## 2013-08-19 DIAGNOSIS — K219 Gastro-esophageal reflux disease without esophagitis: Secondary | ICD-10-CM | POA: Insufficient documentation

## 2013-08-19 DIAGNOSIS — Z87891 Personal history of nicotine dependence: Secondary | ICD-10-CM | POA: Diagnosis not present

## 2013-08-19 DIAGNOSIS — S83209A Unspecified tear of unspecified meniscus, current injury, unspecified knee, initial encounter: Secondary | ICD-10-CM

## 2013-08-19 HISTORY — PX: KNEE ARTHROSCOPY WITH MEDIAL MENISECTOMY: SHX5651

## 2013-08-19 HISTORY — DX: Presence of spectacles and contact lenses: Z97.3

## 2013-08-19 HISTORY — DX: Presence of dental prosthetic device (complete) (partial): Z97.2

## 2013-08-19 LAB — POCT HEMOGLOBIN-HEMACUE: Hemoglobin: 12.2 g/dL — ABNORMAL LOW (ref 13.0–17.0)

## 2013-08-19 LAB — GLUCOSE, CAPILLARY
Glucose-Capillary: 94 mg/dL (ref 70–99)
Glucose-Capillary: 98 mg/dL (ref 70–99)

## 2013-08-19 SURGERY — ARTHROSCOPY, KNEE, WITH MEDIAL MENISCECTOMY
Anesthesia: General | Site: Knee | Laterality: Right

## 2013-08-19 MED ORDER — HYDROCODONE-ACETAMINOPHEN 5-325 MG PO TABS: 1.0000 | ORAL_TABLET | Freq: Four times a day (QID) | ORAL | Status: DC | PRN

## 2013-08-19 MED ORDER — ONDANSETRON HCL 4 MG/2ML IJ SOLN: 4.0000 mg | Freq: Once | INTRAMUSCULAR | Status: DC | PRN

## 2013-08-19 MED ORDER — OXYCODONE HCL 5 MG/5ML PO SOLN: 5.0000 mg | Freq: Once | ORAL | Status: DC | PRN

## 2013-08-19 MED ORDER — MIDAZOLAM HCL 2 MG/2ML IJ SOLN: 1.0000 mg | INTRAMUSCULAR | Status: DC | PRN

## 2013-08-19 MED ORDER — MIDAZOLAM HCL 2 MG/2ML IJ SOLN
INTRAMUSCULAR | Status: AC
  Filled 2013-08-19: qty 2

## 2013-08-19 MED ORDER — SODIUM CHLORIDE 0.9 % IR SOLN
Status: DC | PRN
  Administered 2013-08-19: 1

## 2013-08-19 MED ORDER — FENTANYL CITRATE 0.05 MG/ML IJ SOLN
INTRAMUSCULAR | Status: AC
Start: 2013-08-19 — End: 2013-08-19
  Filled 2013-08-19: qty 4

## 2013-08-19 MED ORDER — MORPHINE SULFATE 4 MG/ML IJ SOLN
INTRAMUSCULAR | Status: AC
  Filled 2013-08-19: qty 1

## 2013-08-19 MED ORDER — PROPOFOL 10 MG/ML IV BOLUS
INTRAVENOUS | Status: DC | PRN
  Administered 2013-08-19: 200 mg via INTRAVENOUS

## 2013-08-19 MED ORDER — CHLORHEXIDINE GLUCONATE 4 % EX LIQD: 60.0000 mL | Freq: Once | CUTANEOUS | Status: DC

## 2013-08-19 MED ORDER — LIDOCAINE HCL (CARDIAC) 20 MG/ML IV SOLN
INTRAVENOUS | Status: DC | PRN
  Administered 2013-08-19: 80 mg via INTRAVENOUS

## 2013-08-19 MED ORDER — OXYCODONE HCL 5 MG PO TABS: 5.0000 mg | ORAL_TABLET | Freq: Once | ORAL | Status: DC | PRN

## 2013-08-19 MED ORDER — FENTANYL CITRATE 0.05 MG/ML IJ SOLN
INTRAMUSCULAR | Status: DC | PRN
  Administered 2013-08-19: 25 ug via INTRAVENOUS
  Administered 2013-08-19: 50 ug via INTRAVENOUS

## 2013-08-19 MED ORDER — MORPHINE SULFATE 4 MG/ML IJ SOLN
INTRAMUSCULAR | Status: DC | PRN
  Administered 2013-08-19: 16:00:00 via INTRA_ARTICULAR

## 2013-08-19 MED ORDER — HYDROMORPHONE HCL PF 1 MG/ML IJ SOLN: 0.2500 mg | INTRAMUSCULAR | Status: DC | PRN

## 2013-08-19 MED ORDER — PROPOFOL 10 MG/ML IV BOLUS
INTRAVENOUS | Status: AC
  Filled 2013-08-19: qty 20

## 2013-08-19 MED ORDER — FENTANYL CITRATE 0.05 MG/ML IJ SOLN: 50.0000 ug | INTRAMUSCULAR | Status: DC | PRN

## 2013-08-19 MED ORDER — CEFAZOLIN SODIUM-DEXTROSE 2-3 GM-% IV SOLR: 2.0000 g | INTRAVENOUS | Status: DC

## 2013-08-19 MED ORDER — LACTATED RINGERS IV SOLN
INTRAVENOUS | Status: DC
  Administered 2013-08-19: 15:00:00 via INTRAVENOUS

## 2013-08-19 MED ORDER — MIDAZOLAM HCL 5 MG/5ML IJ SOLN
INTRAMUSCULAR | Status: DC | PRN
  Administered 2013-08-19: 1 mg via INTRAVENOUS

## 2013-08-19 MED ORDER — CEFAZOLIN SODIUM-DEXTROSE 2-3 GM-% IV SOLR
INTRAVENOUS | Status: AC
  Filled 2013-08-19: qty 50

## 2013-08-19 SURGICAL SUPPLY — 41 items
BANDAGE ELASTIC 6 VELCRO ST LF (GAUZE/BANDAGES/DRESSINGS) ×3 IMPLANT
BLADE CUDA 5.5 (BLADE) IMPLANT
BLADE GREAT WHITE 4.2 (BLADE) ×2 IMPLANT
BLADE GREAT WHITE 4.2MM (BLADE) ×1
BNDG GAUZE ELAST 4 BULKY (GAUZE/BANDAGES/DRESSINGS) ×3 IMPLANT
CANISTER SUCT 3000ML (MISCELLANEOUS) IMPLANT
DRAPE ARTHROSCOPY W/POUCH 114 (DRAPES) ×3 IMPLANT
DRAPE U-SHAPE 47X51 STRL (DRAPES) ×3 IMPLANT
DRSG EMULSION OIL 3X3 NADH (GAUZE/BANDAGES/DRESSINGS) ×3 IMPLANT
DURAPREP 26ML APPLICATOR (WOUND CARE) ×3 IMPLANT
ELECT MENISCUS 165MM 90D (ELECTRODE) IMPLANT
ELECT REM PT RETURN 9FT ADLT (ELECTROSURGICAL)
ELECTRODE REM PT RTRN 9FT ADLT (ELECTROSURGICAL) IMPLANT
GAUZE SPONGE 4X4 12PLY STRL (GAUZE/BANDAGES/DRESSINGS) ×3 IMPLANT
GLOVE BIO SURGEON STRL SZ8.5 (GLOVE) ×3 IMPLANT
GLOVE BIOGEL M STRL SZ7.5 (GLOVE) ×2 IMPLANT
GLOVE BIOGEL PI IND STRL 8 (GLOVE) ×1 IMPLANT
GLOVE BIOGEL PI IND STRL 8.5 (GLOVE) ×1 IMPLANT
GLOVE BIOGEL PI INDICATOR 8 (GLOVE) ×4
GLOVE BIOGEL PI INDICATOR 8.5 (GLOVE) ×2
GLOVE SS BIOGEL STRL SZ 8 (GLOVE) ×1 IMPLANT
GLOVE SUPERSENSE BIOGEL SZ 8 (GLOVE) ×4
GOWN STRL REUS W/ TWL LRG LVL3 (GOWN DISPOSABLE) ×1 IMPLANT
GOWN STRL REUS W/ TWL XL LVL3 (GOWN DISPOSABLE) ×1 IMPLANT
GOWN STRL REUS W/TWL LRG LVL3 (GOWN DISPOSABLE) ×3
GOWN STRL REUS W/TWL XL LVL3 (GOWN DISPOSABLE) ×6
IV NS IRRIG 3000ML ARTHROMATIC (IV SOLUTION) ×4 IMPLANT
KNEE WRAP E Z 3 GEL PACK (MISCELLANEOUS) ×3 IMPLANT
MANIFOLD NEPTUNE II (INSTRUMENTS) ×2 IMPLANT
PACK ARTHROSCOPY DSU (CUSTOM PROCEDURE TRAY) ×3 IMPLANT
PACK BASIN DAY SURGERY FS (CUSTOM PROCEDURE TRAY) ×3 IMPLANT
PENCIL BUTTON HOLSTER BLD 10FT (ELECTRODE) IMPLANT
SET ARTHROSCOPY TUBING (MISCELLANEOUS) ×3
SET ARTHROSCOPY TUBING LN (MISCELLANEOUS) ×1 IMPLANT
SHEET MEDIUM DRAPE 40X70 STRL (DRAPES) ×3 IMPLANT
SYR 3ML 18GX1 1/2 (SYRINGE) IMPLANT
TOWEL OR 17X24 6PK STRL BLUE (TOWEL DISPOSABLE) ×3 IMPLANT
TOWEL OR NON WOVEN STRL DISP B (DISPOSABLE) ×3 IMPLANT
WAND 30 DEG SABER W/CORD (SURGICAL WAND) IMPLANT
WAND STAR VAC 90 (SURGICAL WAND) IMPLANT
WATER STERILE IRR 1000ML POUR (IV SOLUTION) ×3 IMPLANT

## 2013-08-19 NOTE — Interval H&P Note (Signed)
History and Physical Interval Note:  08/19/2013 3:04 PM  Marco Acevedo  has presented today for surgery, with the diagnosis of right medial meniscal tear and chodromalicia  The various methods of treatment have been discussed with the patient and family. After consideration of risks, benefits and other options for treatment, the patient has consented to  Procedure(s): ARTHROSCOPY RIGHT KNEE (Right) as a surgical intervention .  The patient's history has been reviewed, patient examined, no change in status, stable for surgery.  I have reviewed the patient's chart and labs.  Questions were answered to the patient's satisfaction.     Hessie Dibble

## 2013-08-19 NOTE — Anesthesia Procedure Notes (Signed)
Procedure Name: LMA Insertion Date/Time: 08/19/2013 3:48 PM Performed by: Lyndee Leo Pre-anesthesia Checklist: Patient identified, Emergency Drugs available, Suction available and Patient being monitored Patient Re-evaluated:Patient Re-evaluated prior to inductionOxygen Delivery Method: Circle System Utilized Preoxygenation: Pre-oxygenation with 100% oxygen Intubation Type: IV induction Ventilation: Mask ventilation without difficulty LMA: LMA inserted LMA Size: 5.0 Number of attempts: 1 Airway Equipment and Method: bite block Placement Confirmation: positive ETCO2 Tube secured with: Tape Dental Injury: Teeth and Oropharynx as per pre-operative assessment

## 2013-08-19 NOTE — Transfer of Care (Signed)
Immediate Anesthesia Transfer of Care Note  Patient: Marco Acevedo  Procedure(s) Performed: Procedure(s): RIGHT KNEE ARTHROSCOPY WITH PARTIAL MEDIAL MENISECTOMY AND CHONDROPLASTY (Right)  Patient Location: PACU  Anesthesia Type:General  Level of Consciousness: awake, sedated and patient cooperative  Airway & Oxygen Therapy: Patient Spontanous Breathing and Patient connected to face mask oxygen  Post-op Assessment: Report given to PACU RN and Post -op Vital signs reviewed and stable  Post vital signs: Reviewed and stable  Complications: No apparent anesthesia complications

## 2013-08-19 NOTE — Discharge Instructions (Signed)

## 2013-08-19 NOTE — Anesthesia Postprocedure Evaluation (Signed)
  Anesthesia Post-op Note  Patient: Marco Acevedo  Procedure(s) Performed: Procedure(s): RIGHT KNEE ARTHROSCOPY WITH PARTIAL MEDIAL MENISECTOMY AND CHONDROPLASTY (Right)  Patient Location: PACU  Anesthesia Type:GA combined with regional for post-op pain  Level of Consciousness: awake, alert  and oriented  Airway and Oxygen Therapy: Patient Spontanous Breathing  Post-op Pain: mild  Post-op Assessment: Post-op Vital signs reviewed  Post-op Vital Signs: Reviewed  Last Vitals:  Filed Vitals:   08/19/13 1632  BP:   Pulse: 84  Temp: 36.7 C  Resp: 15    Complications: No apparent anesthesia complications

## 2013-08-19 NOTE — Anesthesia Preprocedure Evaluation (Addendum)
Anesthesia Evaluation  Patient identified by MRN, date of birth, ID band Patient awake    Reviewed: Allergy & Precautions, H&P , NPO status , Patient's Chart, lab work & pertinent test results  Airway Mallampati: II TM Distance: >3 FB Neck ROM: Full    Dental  (+) Teeth Intact, Partial Upper, Dental Advisory Given   Pulmonary former smoker,  breath sounds clear to auscultation        Cardiovascular hypertension, Pt. on medications + CAD Rhythm:Regular Rate:Normal     Neuro/Psych    GI/Hepatic GERD-  Medicated and Controlled,  Endo/Other  diabetes, Well Controlled, Type 2, Oral Hypoglycemic AgentsMorbid obesity  Renal/GU Renal disease     Musculoskeletal   Abdominal   Peds  Hematology   Anesthesia Other Findings Pt states he had a sleep apnea test about a year ago and was told he did not need a CPAP machine.  Reproductive/Obstetrics                          Anesthesia Physical Anesthesia Plan  ASA: [name]  Anesthesia Plan: General   Post-op Pain Management:    Induction: Intravenous  Airway Management Planned: LMA  Additional Equipment:   Intra-op Plan:   Post-operative Plan: Extubation in OR  Informed Consent: I have reviewed the patients History and Physical, chart, labs and discussed the procedure including the risks, benefits and alternatives for the proposed anesthesia with the patient or authorized representative who has indicated his/her understanding and acceptance.   Dental advisory given  Plan Discussed with: CRNA, Anesthesiologist and Surgeon  Anesthesia Plan Comments:         Anesthesia Quick Evaluation

## 2013-08-19 NOTE — Op Note (Signed)
#  [id number] 

## 2013-08-20 NOTE — Op Note (Signed)
NAMEMarland Kitchen  MARQUAIL, SCHLABACH NO.:  [id number]  MEDICAL RECORD NO.:  [id number]  LOCATION:                                 FACILITY:  PHYSICIAN:  Monico Blitz. Nikiya Starn, M.D.DATE OF BIRTH:  [birth year]  DATE OF PROCEDURE:  08/19/2013 DATE OF DISCHARGE:  08/19/2013                              OPERATIVE REPORT   PREOPERATIVE DIAGNOSES: 1. Right knee torn medial meniscus. 2. Right knee chondromalacia patella.  POSTOPERATIVE DIAGNOSES: 1. Right knee torn medial meniscus. 2. Right knee chondromalacia patella.  PROCEDURES: 1. Right knee partial medial meniscectomy. 2. Right knee abrasion chondroplasty, patellofemoral.  ANESTHESIA:  General and block.  ATTENDING SURGEON:  Monico Blitz. Rhona Raider, M.D.  ASSISTANT:  Loni Dolly, P.A.  INDICATION FOR PROCEDURE:  The patient is a 79 year old man with several months of intense right knee pain.  This has persisted despite various pills and injections and braces.  By MRI scan, he has an impressive medial meniscus tear and a paucity of degenerative issues.  Pain which limits his ability to rest and play gold and walk and he is offered an arthroscopy.  Informed operative consent was obtained after discussion of possible complications including reaction to anesthesia and infection.  SUMMARY OF FINDINGS AND PROCEDURE:  Under general anesthesia and a knee block, a right knee arthroscopy was performed.  Suprapatellar pouch was benign while the patellofemoral joint exhibited some moderate breakdown at the apex of patella, addressed with a thorough chondroplasty and abrasion to bleeding bone in some small areas.  The medial gutter had a small plica which I excised, but this did not appear to be particularly pathologic.  In the medial compartment, he had a displaceable tear of the posterior horn of the medial meniscus which required about 25% of partial medial meniscectomy, mostly in the posterior horn.  There were minimal degenerative  changes in this compartment.  ACL was normal. Lateral compartment exhibited no evidence of meniscal or articular cartilage injury.  He was discharged home the same day.  DESCRIPTION OF PROCEDURE:  The patient was taken to an operating suite where general anesthetic was applied without difficulty.  He was also given a block in the pre-anesthesia area.  He was positioned supine and prepped and draped in normal sterile fashion.  After the administration of preop IV Kefzol and an appropriate time-out, an arthroscopy of the right knee was performed through a total of two portals.  Findings were as noted above and procedure consisted predominantly the partial medial meniscectomy which was done with a basket and shaver.  We also performed a chondroplasty and abrasion as best as described above.  The knee was thoroughly irrigated followed by placement of Marcaine with epinephrine and morphine.  Adaptic was placed over the portals followed by dry gauze and a loose Ace wrap.  ESTIMATED BLOOD LOSS AND INTRAOPERATIVE FLUIDS:  Can be obtained from Anesthesia records.  DISPOSITION:  The patient was extubated in the operating room and taken to the recovery room in stable addition.  He was to go home same day and follow up in the office closely.  I will contact him by phone tonight.     Monico Blitz Rhona Raider, M.D.  ______________________________ Monico Blitz Rhona Raider, M.D.    PGD/MEDQ  D:  08/19/2013  T:  08/20/2013  Job:  [id number]

## 2013-08-20 NOTE — Op Note (Deleted)
NAMEMarland Kitchen  NASHTON, KWASNIEWSKI NO.:  [id number]  MEDICAL RECORD NO.:  [id number]  LOCATION:                                 FACILITY:  PHYSICIAN:  Monico Blitz. Damond Borchers, M.D.DATE OF BIRTH:  27-Jun-1934  DATE OF PROCEDURE:  08/19/2013 DATE OF DISCHARGE:  08/19/2013                              OPERATIVE REPORT   PREOPERATIVE DIAGNOSES: 1. Right knee torn medial meniscus. 2. Right knee chondromalacia patella.  POSTOPERATIVE DIAGNOSES: 1. Right knee torn medial meniscus. 2. Right knee chondromalacia patella.  PROCEDURES: 1. Right knee partial medial meniscectomy. 2. Right knee abrasion chondroplasty, patellofemoral.  ANESTHESIA:  General and block.  ATTENDING SURGEON:  Monico Blitz. Rhona Raider, M.D.  ASSISTANT:  Loni Dolly, P.A.  INDICATION FOR PROCEDURE:  The patient is a 79 year old man with several months of intense right knee pain.  This has persisted despite various pills and injections and braces.  By MRI scan, he has an impressive medial meniscus tear and a paucity of degenerative issues.  Pain which limits his ability to rest and play gold and walk and he is offered an arthroscopy.  Informed operative consent was obtained after discussion of possible complications including reaction to anesthesia and infection.  SUMMARY OF FINDINGS AND PROCEDURE:  Under general anesthesia and a knee block, a right knee arthroscopy was performed.  Suprapatellar pouch was benign while the patellofemoral joint exhibited some moderate breakdown at the apex of patella, addressed with a thorough chondroplasty and abrasion to bleeding bone in some small areas.  The medial gutter had a small plica which I excised, but this did not appear to be particularly pathologic.  In the medial compartment, he had a displaceable tear of the posterior horn of the medial meniscus which required about 25% of partial medial meniscectomy, mostly in the posterior horn.  There were minimal degenerative  changes in this compartment.  ACL was normal. Lateral compartment exhibited no evidence of meniscal or articular cartilage injury.  He was discharged home the same day.  DESCRIPTION OF PROCEDURE:  The patient was taken to an operating suite where general anesthetic was applied without difficulty.  He was also given a block in the pre-anesthesia area.  He was positioned supine and prepped and draped in normal sterile fashion.  After the administration of preop IV Kefzol and an appropriate time-out, an arthroscopy of the right knee was performed through a total of two portals.  Findings were as noted above and procedure consisted predominantly the partial medial meniscectomy which was done with a basket and shaver.  We also performed a chondroplasty and abrasion as best as described above.  The knee was thoroughly irrigated followed by placement of Marcaine with epinephrine and morphine.  Adaptic was placed over the portals followed by dry gauze and a loose Ace wrap.  ESTIMATED BLOOD LOSS AND INTRAOPERATIVE FLUIDS:  Can be obtained from Anesthesia records.  DISPOSITION:  The patient was extubated in the operating room and taken to the recovery room in stable addition.  He was to go home same day and follow up in the office closely.  I will contact him by phone tonight.     Monico Blitz Rhona Raider, M.D.  ______________________________ Monico Blitz Rhona Raider, M.D.    PGD/MEDQ  D:  08/19/2013  T:  08/20/2013  Job:  [id number]

## 2013-08-21 ENCOUNTER — Encounter (HOSPITAL_BASED_OUTPATIENT_CLINIC_OR_DEPARTMENT_OTHER): Payer: Self-pay | Admitting: Orthopaedic Surgery

## 2013-08-21 NOTE — Telephone Encounter (Signed)
Too soon for DAPT discontinuation Needs another 4-6 months

## 2013-08-21 NOTE — Telephone Encounter (Signed)
returned [place] call @ [facility] to give Dr.Smith response.Too soon for DAPT discontinuation Needs another 4-6 months.she sts that she was not the one who called and that pt had already had the surgery.

## 2013-08-26 DIAGNOSIS — M79609 Pain in unspecified limb: Secondary | ICD-10-CM | POA: Diagnosis not present

## 2013-08-26 DIAGNOSIS — M25569 Pain in unspecified knee: Secondary | ICD-10-CM | POA: Diagnosis not present

## 2013-08-29 DIAGNOSIS — M25569 Pain in unspecified knee: Secondary | ICD-10-CM | POA: Diagnosis not present

## 2013-08-29 DIAGNOSIS — M79609 Pain in unspecified limb: Secondary | ICD-10-CM | POA: Diagnosis not present

## 2013-09-03 DIAGNOSIS — M25569 Pain in unspecified knee: Secondary | ICD-10-CM | POA: Diagnosis not present

## 2013-09-03 DIAGNOSIS — M79609 Pain in unspecified limb: Secondary | ICD-10-CM | POA: Diagnosis not present

## 2013-09-04 DIAGNOSIS — M25569 Pain in unspecified knee: Secondary | ICD-10-CM | POA: Diagnosis not present

## 2013-09-04 DIAGNOSIS — M79609 Pain in unspecified limb: Secondary | ICD-10-CM | POA: Diagnosis not present

## 2013-09-05 DIAGNOSIS — N183 Chronic kidney disease, stage 3 unspecified: Secondary | ICD-10-CM | POA: Diagnosis not present

## 2013-09-05 DIAGNOSIS — N289 Disorder of kidney and ureter, unspecified: Secondary | ICD-10-CM | POA: Diagnosis not present

## 2013-09-05 DIAGNOSIS — M109 Gout, unspecified: Secondary | ICD-10-CM | POA: Diagnosis not present

## 2013-09-08 DIAGNOSIS — M25569 Pain in unspecified knee: Secondary | ICD-10-CM | POA: Diagnosis not present

## 2013-09-08 DIAGNOSIS — M79609 Pain in unspecified limb: Secondary | ICD-10-CM | POA: Diagnosis not present

## 2013-09-10 DIAGNOSIS — M79609 Pain in unspecified limb: Secondary | ICD-10-CM | POA: Diagnosis not present

## 2013-09-10 DIAGNOSIS — M25569 Pain in unspecified knee: Secondary | ICD-10-CM | POA: Diagnosis not present

## 2013-09-24 DIAGNOSIS — M79609 Pain in unspecified limb: Secondary | ICD-10-CM | POA: Diagnosis not present

## 2013-09-24 DIAGNOSIS — Z23 Encounter for immunization: Secondary | ICD-10-CM | POA: Diagnosis not present

## 2013-09-24 DIAGNOSIS — M25569 Pain in unspecified knee: Secondary | ICD-10-CM | POA: Diagnosis not present

## 2013-10-01 DIAGNOSIS — M1710 Unilateral primary osteoarthritis, unspecified knee: Secondary | ICD-10-CM | POA: Diagnosis not present

## 2013-10-01 DIAGNOSIS — L821 Other seborrheic keratosis: Secondary | ICD-10-CM | POA: Diagnosis not present

## 2013-10-01 DIAGNOSIS — D235 Other benign neoplasm of skin of trunk: Secondary | ICD-10-CM | POA: Diagnosis not present

## 2013-10-01 DIAGNOSIS — L820 Inflamed seborrheic keratosis: Secondary | ICD-10-CM | POA: Diagnosis not present

## 2013-10-24 DIAGNOSIS — IMO0002 Reserved for concepts with insufficient information to code with codable children: Secondary | ICD-10-CM | POA: Diagnosis not present

## 2013-10-29 DIAGNOSIS — M5126 Other intervertebral disc displacement, lumbar region: Secondary | ICD-10-CM | POA: Diagnosis not present

## 2013-10-29 DIAGNOSIS — M5137 Other intervertebral disc degeneration, lumbosacral region: Secondary | ICD-10-CM | POA: Diagnosis not present

## 2013-10-31 DIAGNOSIS — IMO0002 Reserved for concepts with insufficient information to code with codable children: Secondary | ICD-10-CM | POA: Diagnosis not present

## 2013-11-13 DIAGNOSIS — IMO0002 Reserved for concepts with insufficient information to code with codable children: Secondary | ICD-10-CM | POA: Diagnosis not present

## 2013-12-05 ENCOUNTER — Ambulatory Visit (INDEPENDENT_AMBULATORY_CARE_PROVIDER_SITE_OTHER): Payer: Medicare Other | Admitting: Interventional Cardiology

## 2013-12-05 ENCOUNTER — Encounter: Payer: Self-pay | Admitting: Interventional Cardiology

## 2013-12-05 VITALS — BP 138/68 | HR 81 | Ht 68.0 in | Wt 219.0 lb

## 2013-12-05 DIAGNOSIS — J410 Simple chronic bronchitis: Secondary | ICD-10-CM

## 2013-12-05 DIAGNOSIS — N183 Chronic kidney disease, stage 3 unspecified: Secondary | ICD-10-CM | POA: Diagnosis not present

## 2013-12-05 DIAGNOSIS — I2510 Atherosclerotic heart disease of native coronary artery without angina pectoris: Secondary | ICD-10-CM | POA: Diagnosis not present

## 2013-12-05 DIAGNOSIS — I10 Essential (primary) hypertension: Secondary | ICD-10-CM | POA: Diagnosis not present

## 2013-12-05 DIAGNOSIS — IMO0002 Reserved for concepts with insufficient information to code with codable children: Secondary | ICD-10-CM | POA: Diagnosis not present

## 2013-12-05 NOTE — Progress Notes (Signed)
Patient ID: Marco VELTRI, male   DOB: November 16, 1934, 79 y.o.   MRN: [id number]    [address], [place]  [address] Phone: [phone] Fax:  [phone]  Date:  12/05/2013   ID:  Marco Acevedo, DOB 29-May-1934, MRN [id number]  PCP:   Melinda Crutch, MD   ASSESSMENT:  1. recent increased frequency of angina until 2 weeks ago. He is now back to baseline 2. CAD with prior multiple PCI procedures 3. Chronic diastolic heart failure 4. Hypertension  PLAN:  1.ECG today is normal. We will clinically observe. If anginal complaints excel rate, we may need to do an ischemic evaluation or coronary angiogram. 2. Clinical followup in 6-9 months 3. No change in current medical regimen   SUBJECTIVE: Marco Acevedo is a 79 y.o. male stasis starting about a month ago he has had increasingly frequent episodes of angina. Until 2 weeks ago he was using multiple tablets daily. During this time he did not have any episodes of angina that lasted greater than 30 minutes. He would always use nitroglycerin and it would seem to hasten the resolution. On the past 10 days he is been able play golf and do his typical activity without precipitating angina. He is 89 to use a nitroglycerin in the past week or so. Otherwise things have been stable. No medication side effects.   Wt Readings from Last 3 Encounters:  12/05/13 219 lb (99.338 kg)  08/19/13 213 lb (96.616 kg)  08/19/13 213 lb (96.616 kg)     Past Medical History  Diagnosis Date  . Diabetes mellitus type II   . Hyperlipidemia   . CAD (coronary artery disease)     no prior MI, PCI x 2 in 2009 and 02/2011 in [place]  . GERD (gastroesophageal reflux disease)   . BPH (benign prostatic hyperplasia)   . Granuloma annulare   . Gout   . Hypertension   . Wears dentures     top  . Wears glasses     Current Outpatient Prescriptions  Medication Sig Dispense Refill  . albuterol (PROVENTIL HFA) 108 (90 BASE) MCG/ACT inhaler Inhale 2 puffs  into the lungs every 6 (six) hours as needed.      [name] albuterol (PROVENTIL) (2.5 MG/3ML) 0.083% nebulizer solution Take 2.5 mg by nebulization every 6 (six) hours as needed.      [name] allopurinol (ZYLOPRIM) 300 MG tablet Take 300 mg by mouth daily.      [name] aspirin EC 81 MG tablet Take 81 mg by mouth daily.      . clopidogrel (PLAVIX) 75 MG tablet Take 75 mg by mouth daily.      . Ferrous Sulfate (IRON) 325 (65 FE) MG TABS Take 1 tablet by mouth daily.      . finasteride (PROSCAR) 5 MG tablet Take 1 tablet (5 mg total) by mouth daily.  30 tablet  5  . gemfibrozil (LOPID) 600 MG tablet Take 600 mg by mouth daily.      [name] glyBURIDE (DIABETA) 2.5 MG tablet Take 5 mg by mouth daily with breakfast.       . guaifenesin (HUMIBID E) 400 MG TABS Take 400 mg by mouth daily as needed (for cough). For cough      . lisinopril (PRINIVIL,ZESTRIL) 10 MG tablet Take 10 mg by mouth daily.      . metFORMIN (GLUCOPHAGE) 1000 MG tablet Take 1,000 mg by mouth 2 (two) times daily with a meal.      .  nitroGLYCERIN (NITROSTAT) 0.4 MG SL tablet Place 1 tablet (0.4 mg total) under the tongue every 5 (five) minutes x 3 doses as needed for chest pain.  25 tablet  5  . omeprazole (PRILOSEC) 20 MG capsule Take 20 mg by mouth 2 (two) times daily before a meal.       . pravastatin (PRAVACHOL) 40 MG tablet Take 40 mg by mouth daily.      . primidone (MYSOLINE) 50 MG tablet Take 50 mg by mouth at bedtime.       [name] tiotropium (SPIRIVA) 18 MCG inhalation capsule Place 18 mcg into inhaler and inhale daily.      . vitamin B-12 (CYANOCOBALAMIN) 1000 MCG tablet Take 1,000 mcg by mouth daily.       No current facility-administered medications for this visit.    Allergies:   No Known Allergies  Social History:  The patient  reports that he quit smoking about 2 years ago. His smoking use included Cigarettes. He has a 64 pack-year smoking history. He has never used smokeless tobacco. He reports that he drinks alcohol. He reports that he does  not use illicit drugs.   ROS:  Please see the history of present illness.   No neurological complaints. No blood in urine or stool.   All other systems reviewed and negative.   OBJECTIVE: VS:  BP 138/68  Pulse 81  Ht 5\' 8"  (1.727 m)  Wt 219 lb (99.338 kg)  BMI 33.31 kg/m2 Well nourished, well developed, in no acute distress, appears than his stated age 31: normal Neck: JVD flat. Carotid bruit absent  Cardiac:  normal S1, S2; RRR; no murmur Lungs:  clear to auscultation bilaterally, no wheezing, rhonchi or rales Abd: soft, nontender, no hepatomegaly Ext: Edema absent. Pulses 2+ Skin: warm and dry Neuro:  CNs 2-12 intact, no focal abnormalities noted  EKG:  ECG is normal.       Signed, Illene Labrador [name], MD 12/05/2013 11:30 AM

## 2013-12-05 NOTE — Patient Instructions (Signed)
Your physician wants you to follow-up in: 6 MONTHS with Dr Tamala Julian.  You will receive a reminder letter in the mail two months in advance. If you don't receive a letter, please call our office to schedule the follow-up appointment.  Your physician recommends that you continue on your current medications as directed. Please refer to the Current Medication list given to you today.

## 2014-01-06 DIAGNOSIS — IMO0002 Reserved for concepts with insufficient information to code with codable children: Secondary | ICD-10-CM | POA: Diagnosis not present

## 2014-02-04 ENCOUNTER — Other Ambulatory Visit: Payer: Self-pay | Admitting: Family Medicine

## 2014-02-04 DIAGNOSIS — Z23 Encounter for immunization: Secondary | ICD-10-CM | POA: Diagnosis not present

## 2014-02-04 DIAGNOSIS — E1144 Type 2 diabetes mellitus with diabetic amyotrophy: Secondary | ICD-10-CM | POA: Diagnosis not present

## 2014-02-04 DIAGNOSIS — R197 Diarrhea, unspecified: Secondary | ICD-10-CM

## 2014-02-04 DIAGNOSIS — R140 Abdominal distension (gaseous): Secondary | ICD-10-CM

## 2014-02-04 DIAGNOSIS — R109 Unspecified abdominal pain: Secondary | ICD-10-CM | POA: Diagnosis not present

## 2014-02-11 DIAGNOSIS — M5416 Radiculopathy, lumbar region: Secondary | ICD-10-CM | POA: Diagnosis not present

## 2014-02-13 ENCOUNTER — Ambulatory Visit
Admission: RE | Admit: 2014-02-13 | Discharge: 2014-02-13 | Disposition: A | Discharge status: Home / Self Care | Payer: Medicare Other | Source: Ambulatory Visit | Attending: Family Medicine | Admitting: Family Medicine

## 2014-02-13 DIAGNOSIS — R140 Abdominal distension (gaseous): Secondary | ICD-10-CM

## 2014-02-13 DIAGNOSIS — R197 Diarrhea, unspecified: Secondary | ICD-10-CM

## 2014-02-13 DIAGNOSIS — K8020 Calculus of gallbladder without cholecystitis without obstruction: Secondary | ICD-10-CM | POA: Diagnosis not present

## 2014-02-17 ENCOUNTER — Telehealth: Payer: Self-pay | Admitting: Interventional Cardiology

## 2014-02-17 NOTE — Telephone Encounter (Signed)
New message     Pt says he needs gallbladder surgery.  Who do Dr Tamala Julian recommend?

## 2014-02-17 NOTE — Telephone Encounter (Signed)
Routed to [place] for his recommendation

## 2014-02-17 NOTE — Telephone Encounter (Signed)
Marco Acevedo or Marco Acevedo.

## 2014-02-19 NOTE — Telephone Encounter (Signed)
Pt aware of Dr.Smith's recommendations. Pt thankful for the assistance. Pt sts that his gallbladder sx is scheduled with [facility]

## 2014-02-26 DIAGNOSIS — K8020 Calculus of gallbladder without cholecystitis without obstruction: Secondary | ICD-10-CM | POA: Diagnosis not present

## 2014-03-19 ENCOUNTER — Encounter (HOSPITAL_COMMUNITY): Payer: Self-pay | Admitting: Interventional Cardiology

## 2014-04-23 ENCOUNTER — Encounter (HOSPITAL_COMMUNITY): Payer: Self-pay | Admitting: Interventional Cardiology

## 2014-04-24 DIAGNOSIS — K8020 Calculus of gallbladder without cholecystitis without obstruction: Secondary | ICD-10-CM | POA: Diagnosis not present

## 2014-04-24 DIAGNOSIS — R1013 Epigastric pain: Secondary | ICD-10-CM | POA: Diagnosis not present

## 2014-04-24 DIAGNOSIS — R591 Generalized enlarged lymph nodes: Secondary | ICD-10-CM | POA: Diagnosis not present

## 2014-04-24 DIAGNOSIS — R079 Chest pain, unspecified: Secondary | ICD-10-CM | POA: Diagnosis not present

## 2014-04-28 DIAGNOSIS — R1013 Epigastric pain: Secondary | ICD-10-CM | POA: Diagnosis not present

## 2014-05-18 DIAGNOSIS — R197 Diarrhea, unspecified: Secondary | ICD-10-CM | POA: Diagnosis not present

## 2014-05-18 DIAGNOSIS — R1013 Epigastric pain: Secondary | ICD-10-CM | POA: Diagnosis not present

## 2014-05-27 DIAGNOSIS — J322 Chronic ethmoidal sinusitis: Secondary | ICD-10-CM | POA: Diagnosis not present

## 2014-05-27 DIAGNOSIS — H9202 Otalgia, left ear: Secondary | ICD-10-CM | POA: Diagnosis not present

## 2014-05-27 DIAGNOSIS — H6982 Other specified disorders of Eustachian tube, left ear: Secondary | ICD-10-CM | POA: Diagnosis not present

## 2014-05-27 DIAGNOSIS — J320 Chronic maxillary sinusitis: Secondary | ICD-10-CM | POA: Diagnosis not present

## 2014-05-27 DIAGNOSIS — K118 Other diseases of salivary glands: Secondary | ICD-10-CM | POA: Diagnosis not present

## 2014-06-01 ENCOUNTER — Encounter: Payer: Self-pay | Admitting: Interventional Cardiology

## 2014-06-01 ENCOUNTER — Ambulatory Visit (INDEPENDENT_AMBULATORY_CARE_PROVIDER_SITE_OTHER): Payer: Medicare Other | Admitting: Interventional Cardiology

## 2014-06-01 VITALS — BP 132/70 | HR 78 | Ht 69.0 in | Wt 225.0 lb

## 2014-06-01 DIAGNOSIS — E7800 Pure hypercholesterolemia, unspecified: Secondary | ICD-10-CM

## 2014-06-01 DIAGNOSIS — I2510 Atherosclerotic heart disease of native coronary artery without angina pectoris: Secondary | ICD-10-CM

## 2014-06-01 DIAGNOSIS — G4733 Obstructive sleep apnea (adult) (pediatric): Secondary | ICD-10-CM | POA: Diagnosis not present

## 2014-06-01 DIAGNOSIS — N183 Chronic kidney disease, stage 3 unspecified: Secondary | ICD-10-CM

## 2014-06-01 DIAGNOSIS — I10 Essential (primary) hypertension: Secondary | ICD-10-CM | POA: Diagnosis not present

## 2014-06-01 NOTE — Patient Instructions (Signed)
Your physician recommends that you continue on your current medications as directed. Please refer to the Current Medication list given to you today.  Your physician wants you to follow-up in: 9-12 months with Dr.Smith You will receive a reminder letter in the mail two months in advance. If you don't receive a letter, please call our office to schedule the follow-up appointment.

## 2014-06-01 NOTE — Progress Notes (Signed)
Cardiology Office Note   Date:  06/01/2014   ID:  Marco, Acevedo 06/14/1934, MRN [id number]  PCP:   Melinda Crutch, MD  Cardiologist:   Sinclair Grooms, MD   No chief complaint on file.     History of Present Illness: Marco Acevedo is a 80 y.o. male who presents for CAD and not having much angina. Not playing golf due to GI and URI complaints.    Past Medical History  Diagnosis Date  . Diabetes mellitus type II   . Hyperlipidemia   . CAD (coronary artery disease)     no prior MI, PCI x 2 in 2009 and 02/2011 in [place]  . GERD (gastroesophageal reflux disease)   . BPH (benign prostatic hyperplasia)   . Granuloma annulare   . Gout   . Hypertension   . Wears dentures     top  . Wears glasses     Past Surgical History  Procedure Laterality Date  . None    . Cardiac catheterization  2009,2012  . Colonoscopy    . Lipoma excision    . Eye surgery      both cataracts  . Blepharoplasty    . Knee arthroscopy with medial menisectomy Right 08/19/2013    Procedure: RIGHT KNEE ARTHROSCOPY WITH PARTIAL MEDIAL MENISECTOMY AND CHONDROPLASTY;  Surgeon: Hessie Dibble, MD;  Location: [facility];  Service: Orthopedics;  Laterality: Right;  . Left heart catheterization with coronary angiogram N/A 10/03/2011    Procedure: LEFT HEART CATHETERIZATION WITH CORONARY ANGIOGRAM;  Surgeon: Sinclair Grooms, MD;  Location: [facility] CATH LAB;  Service: Cardiovascular;  Laterality: N/A;  . Left heart catheterization with coronary angiogram N/A 09/12/2012    Procedure: LEFT HEART CATHETERIZATION WITH CORONARY ANGIOGRAM;  Surgeon: Sinclair Grooms, MD;  Location: [facility] CATH LAB;  Service: Cardiovascular;  Laterality: N/A;  . Percutaneous coronary stent intervention (pci-s) N/A 09/17/2012    Procedure: PERCUTANEOUS CORONARY STENT INTERVENTION (PCI-S);  Surgeon: Sinclair Grooms, MD;  Location: [facility] CATH LAB;  Service: Cardiovascular;  Laterality: N/A;     Current Outpatient  Prescriptions  Medication Sig Dispense Refill  . albuterol (PROVENTIL HFA) 108 (90 BASE) MCG/ACT inhaler Inhale 2 puffs into the lungs every 6 (six) hours as needed.    [name] albuterol (PROVENTIL) (2.5 MG/3ML) 0.083% nebulizer solution Take 2.5 mg by nebulization every 6 (six) hours as needed.    [name] allopurinol (ZYLOPRIM) 300 MG tablet Take 300 mg by mouth daily.    [name] aspirin EC 81 MG tablet Take 81 mg by mouth daily.    . cefUROXime (CEFTIN) 250 MG tablet Take 250 mg by mouth 2 (two) times daily with a meal.    . clopidogrel (PLAVIX) 75 MG tablet Take 75 mg by mouth daily.    . Ferrous Sulfate (IRON) 325 (65 FE) MG TABS Take 1 tablet by mouth daily.    . finasteride (PROSCAR) 5 MG tablet Take 1 tablet (5 mg total) by mouth daily. 30 tablet 5  . gemfibrozil (LOPID) 600 MG tablet Take 600 mg by mouth daily.    [name] glyBURIDE (DIABETA) 2.5 MG tablet Take 5 mg by mouth daily with breakfast.     . lactobacillus acidophilus & bulgar (LACTINEX) chewable tablet Chew 1 tablet by mouth 2 (two) times daily.    [name] lisinopril (PRINIVIL,ZESTRIL) 10 MG tablet Take 10 mg by mouth daily.    . metFORMIN (GLUCOPHAGE) 1000 MG tablet Take 1,000 mg  by mouth 2 (two) times daily with a meal.    . nitroGLYCERIN (NITROSTAT) 0.4 MG SL tablet Place 1 tablet (0.4 mg total) under the tongue every 5 (five) minutes x 3 doses as needed for chest pain. 25 tablet 5  . omeprazole (PRILOSEC) 20 MG capsule Take 20 mg by mouth 2 (two) times daily before a meal.     . pravastatin (PRAVACHOL) 40 MG tablet Take 40 mg by mouth daily.    . primidone (MYSOLINE) 50 MG tablet Take 50 mg by mouth at bedtime.     [name] tiotropium (SPIRIVA) 18 MCG inhalation capsule Place 18 mcg into inhaler and inhale daily.    . vitamin B-12 (CYANOCOBALAMIN) 1000 MCG tablet Take 1,000 mcg by mouth daily.     No current facility-administered medications for this visit.    Allergies:   Review of patient's allergies indicates no known allergies.    Social History:   The patient  reports that he quit smoking about 3 years ago. His smoking use included Cigarettes. He has a 64 pack-year smoking history. He has never used smokeless tobacco. He reports that he drinks alcohol. He reports that he does not use illicit drugs.   Family History:  The patient's family history includes Aneurysm in his mother; Aortic aneurysm in his father; COPD in his sister; Cancer in his brother.    ROS:  Please see the history of present illness.   Otherwise, review of systems are positive for ear and upper respiratory infection (sinus).  Having diarrhea.  All other systems are reviewed and negative.    PHYSICAL EXAM: VS:  BP 132/70 mmHg  Pulse 78  Ht 5\' 9"  (1.753 m)  Wt 225 lb (102.059 kg)  BMI 33.21 kg/m2  SpO2 94% , BMI Body mass index is 33.21 kg/(m^2). GEN: Well nourished, well developed, in no acute distress HEENT: normal Neck: no JVD, carotid bruits, or masses Cardiac: RRR; no murmurs, rubs, or gallops,no edema  Respiratory:  clear to auscultation bilaterally, normal work of breathing GI: soft, nontender, nondistended, + BS MS: no deformity or atrophy Skin: warm and dry, no rash Neuro:  Strength and sensation are intact Psych: euthymic mood, full affect   EKG:  EKG is not ordered today.    Recent Labs: 08/19/2013: Hemoglobin 12.2*    Lipid Panel    Component Value Date/Time   CHOL 187 10/03/2011 0836   TRIG 189* 10/03/2011 0836   HDL 48 10/03/2011 0836   CHOLHDL 3.9 10/03/2011 0836   VLDL 38 10/03/2011 0836   LDLCALC 101* 10/03/2011 0836      Wt Readings from Last 3 Encounters:  06/01/14 225 lb (102.059 kg)  12/05/13 219 lb (99.338 kg)  08/19/13 213 lb (96.616 kg)      Other studies Reviewed: Additional studies/ records that were reviewed today include: None   ASSESSMENT AND PLAN:  1.  CAD with comtrolled angina.  2.  Hypertension with good control. 3.  COPD, with chronic debilitation   Current medicines are reviewed at length with  the patient today.  The patient does not have concerns regarding medicines.  The following changes have been made:  no change  Labs/ tests ordered today include:  No orders of the defined types were placed in this encounter.     Disposition:   FU with Linard Millers in 12 months   Signed, Sinclair Grooms, MD  06/01/2014 10:41 AM    [place],  [place]  [id number] Phone: [phone]; Fax: [phone]

## 2014-06-03 DIAGNOSIS — H903 Sensorineural hearing loss, bilateral: Secondary | ICD-10-CM | POA: Diagnosis not present

## 2014-06-03 DIAGNOSIS — H9202 Otalgia, left ear: Secondary | ICD-10-CM | POA: Diagnosis not present

## 2014-06-03 DIAGNOSIS — K118 Other diseases of salivary glands: Secondary | ICD-10-CM | POA: Diagnosis not present

## 2014-06-03 DIAGNOSIS — H9313 Tinnitus, bilateral: Secondary | ICD-10-CM | POA: Diagnosis not present

## 2014-06-03 DIAGNOSIS — L570 Actinic keratosis: Secondary | ICD-10-CM | POA: Diagnosis not present

## 2014-06-03 DIAGNOSIS — X32XXXD Exposure to sunlight, subsequent encounter: Secondary | ICD-10-CM | POA: Diagnosis not present

## 2014-06-03 DIAGNOSIS — L820 Inflamed seborrheic keratosis: Secondary | ICD-10-CM | POA: Diagnosis not present

## 2014-06-04 DIAGNOSIS — D490 Neoplasm of unspecified behavior of digestive system: Secondary | ICD-10-CM | POA: Diagnosis not present

## 2014-06-05 ENCOUNTER — Other Ambulatory Visit (HOSPITAL_COMMUNITY): Payer: Self-pay | Admitting: Otolaryngology

## 2014-06-05 ENCOUNTER — Inpatient Hospital Stay
Admission: RE | Admit: 2014-06-05 | Discharge: 2014-06-05 | Disposition: A | Discharge status: Home / Self Care | Payer: Self-pay | Source: Ambulatory Visit | Attending: Otolaryngology | Admitting: Otolaryngology

## 2014-06-05 DIAGNOSIS — R52 Pain, unspecified: Secondary | ICD-10-CM

## 2014-06-05 DIAGNOSIS — K118 Other diseases of salivary glands: Secondary | ICD-10-CM

## 2014-06-08 ENCOUNTER — Telehealth: Payer: Self-pay | Admitting: Interventional Cardiology

## 2014-06-08 NOTE — Telephone Encounter (Signed)
New message      Request for surgical clearance:  What type of surgery is being performed? Neck biopsy 1. When is this surgery scheduled?  Pending clearance  2. Are there any medications that need to be held prior to surgery and how long? plavix  Name of physician performing surgery? Dr Larey Seat physician 3. What is your office phone and fax number?

## 2014-06-08 NOTE — Telephone Encounter (Signed)
Route to Dr.Smith for approval

## 2014-06-09 NOTE — Telephone Encounter (Signed)
Follow Up        Marco Acevedo calling from [facility] following up on previous message for approval for upcoming procedure. Please call back and advise.

## 2014-06-09 NOTE — Telephone Encounter (Signed)
Returned pt Marco Acevedo @ [facility]'s call adv her that, the clearance rqst has been routed to [place] for his approval. I will call her back as soon as he responds.

## 2014-06-10 DIAGNOSIS — K118 Other diseases of salivary glands: Secondary | ICD-10-CM | POA: Diagnosis not present

## 2014-06-11 NOTE — Telephone Encounter (Signed)
lmom for [facility] @ [facility]

## 2014-06-11 NOTE — Telephone Encounter (Signed)
Cleared for biopsy.  Needs to be off Plavix for at least 5-7 days for biopsy.  I believe we have already had this notification and authorize the biopsy previously

## 2014-06-15 ENCOUNTER — Other Ambulatory Visit: Payer: Self-pay | Admitting: Radiology

## 2014-06-16 ENCOUNTER — Ambulatory Visit (HOSPITAL_COMMUNITY)
Admission: RE | Admit: 2014-06-16 | Discharge: 2014-06-16 | Disposition: A | Discharge status: Home / Self Care | Payer: Medicare Other | Source: Ambulatory Visit | Attending: Otolaryngology | Admitting: Otolaryngology

## 2014-06-16 DIAGNOSIS — R220 Localized swelling, mass and lump, head: Secondary | ICD-10-CM | POA: Diagnosis not present

## 2014-06-16 DIAGNOSIS — I252 Old myocardial infarction: Secondary | ICD-10-CM | POA: Insufficient documentation

## 2014-06-16 DIAGNOSIS — E785 Hyperlipidemia, unspecified: Secondary | ICD-10-CM | POA: Insufficient documentation

## 2014-06-16 DIAGNOSIS — I10 Essential (primary) hypertension: Secondary | ICD-10-CM | POA: Insufficient documentation

## 2014-06-16 DIAGNOSIS — Z79899 Other long term (current) drug therapy: Secondary | ICD-10-CM | POA: Insufficient documentation

## 2014-06-16 DIAGNOSIS — D119 Benign neoplasm of major salivary gland, unspecified: Secondary | ICD-10-CM | POA: Diagnosis not present

## 2014-06-16 DIAGNOSIS — D110 Benign neoplasm of parotid gland: Secondary | ICD-10-CM | POA: Diagnosis not present

## 2014-06-16 DIAGNOSIS — I2510 Atherosclerotic heart disease of native coronary artery without angina pectoris: Secondary | ICD-10-CM | POA: Insufficient documentation

## 2014-06-16 DIAGNOSIS — Z7982 Long term (current) use of aspirin: Secondary | ICD-10-CM | POA: Insufficient documentation

## 2014-06-16 DIAGNOSIS — E119 Type 2 diabetes mellitus without complications: Secondary | ICD-10-CM | POA: Diagnosis not present

## 2014-06-16 DIAGNOSIS — K118 Other diseases of salivary glands: Secondary | ICD-10-CM | POA: Diagnosis not present

## 2014-06-16 DIAGNOSIS — K219 Gastro-esophageal reflux disease without esophagitis: Secondary | ICD-10-CM | POA: Insufficient documentation

## 2014-06-16 DIAGNOSIS — Z87891 Personal history of nicotine dependence: Secondary | ICD-10-CM | POA: Insufficient documentation

## 2014-06-16 LAB — BASIC METABOLIC PANEL
Anion gap: 9 (ref 5–15)
BUN: 24 mg/dL — ABNORMAL HIGH (ref 6–23)
CALCIUM: 8.9 mg/dL (ref 8.4–10.5)
CO2: 23 mmol/L (ref 19–32)
Chloride: 106 mmol/L (ref 96–112)
Creatinine, Ser: 1.62 mg/dL — ABNORMAL HIGH (ref 0.50–1.35)
GFR, EST AFRICAN AMERICAN: 46 mL/min — AB (ref >90)
GFR, EST NON AFRICAN AMERICAN: 39 mL/min — AB (ref >90)
GLUCOSE: 129 mg/dL — AB (ref 70–99)
POTASSIUM: 4.4 mmol/L (ref 3.5–5.1)
SODIUM: 138 mmol/L (ref 135–145)

## 2014-06-16 LAB — CBC WITH DIFFERENTIAL/PLATELET
BASOS PCT: 2 % — AB (ref 0–1)
Basophils Absolute: 0.1 10*3/uL (ref 0.0–0.1)
Eosinophils Absolute: 0.4 10*3/uL (ref 0.0–0.7)
Eosinophils Relative: 6 % — ABNORMAL HIGH (ref 0–5)
HEMATOCRIT: 36.9 % — AB (ref 39.0–52.0)
Hemoglobin: 12.6 g/dL — ABNORMAL LOW (ref 13.0–17.0)
LYMPHS PCT: 28 % (ref 12–46)
Lymphs Abs: 1.6 10*3/uL (ref 0.7–4.0)
MCH: 29.5 pg (ref 26.0–34.0)
MCHC: 34.1 g/dL (ref 30.0–36.0)
MCV: 86.4 fL (ref 78.0–100.0)
MONO ABS: 0.5 10*3/uL (ref 0.1–1.0)
Monocytes Relative: 9 % (ref 3–12)
NEUTROS PCT: 55 % (ref 43–77)
Neutro Abs: 3.2 10*3/uL (ref 1.7–7.7)
Platelets: 324 10*3/uL (ref 150–400)
RBC: 4.27 MIL/uL (ref 4.22–5.81)
RDW: 14 % (ref 11.5–15.5)
WBC: 5.8 10*3/uL (ref 4.0–10.5)

## 2014-06-16 LAB — GLUCOSE, CAPILLARY: Glucose-Capillary: 135 mg/dL — ABNORMAL HIGH (ref 70–99)

## 2014-06-16 LAB — PROTIME-INR
INR: 0.96 (ref 0.00–1.49)
Prothrombin Time: 12.9 seconds (ref 11.6–15.2)

## 2014-06-16 LAB — APTT: aPTT: 32 seconds (ref 24–37)

## 2014-06-16 MED ORDER — FENTANYL CITRATE 0.05 MG/ML IJ SOLN
INTRAMUSCULAR | Status: AC
  Filled 2014-06-16: qty 2

## 2014-06-16 MED ORDER — HYDROCODONE-ACETAMINOPHEN 5-325 MG PO TABS
1.0000 | ORAL_TABLET | ORAL | Status: DC | PRN
  Filled 2014-06-16: qty 2

## 2014-06-16 MED ORDER — SODIUM CHLORIDE 0.9 % IV SOLN
INTRAVENOUS | Status: AC | PRN
  Administered 2014-06-16: 50 mL/h via INTRAVENOUS

## 2014-06-16 MED ORDER — MIDAZOLAM HCL 2 MG/2ML IJ SOLN
INTRAMUSCULAR | Status: AC | PRN
  Administered 2014-06-16 (×3): 0.5 mg via INTRAVENOUS

## 2014-06-16 MED ORDER — SODIUM CHLORIDE 0.9 % IV SOLN: INTRAVENOUS | Status: DC

## 2014-06-16 MED ORDER — LIDOCAINE HCL (PF) 1 % IJ SOLN
INTRAMUSCULAR | Status: AC
  Filled 2014-06-16: qty 10

## 2014-06-16 MED ORDER — FENTANYL CITRATE 0.05 MG/ML IJ SOLN
INTRAMUSCULAR | Status: AC | PRN
  Administered 2014-06-16 (×3): 25 ug via INTRAVENOUS

## 2014-06-16 MED ORDER — MIDAZOLAM HCL 2 MG/2ML IJ SOLN
INTRAMUSCULAR | Status: AC
  Filled 2014-06-16: qty 2

## 2014-06-16 NOTE — H&P (Signed)
Chief Complaint: Left parotid mass Referring Physician(s): Crossley,James J  History of Present Illness: Marco Acevedo is a 80 y.o. male smoker with several month history of left ear pain and palpable soft tissue mass in parotid region which has persisted despite outpatient antibiotic therapy. Recent imaging has revealed a 2 cm hypervascular left parotid mass. The patient presents today for ultrasound-guided biopsy of the left parotid mass. Patient denies any prior history of cancer.  Past Medical History  Diagnosis Date  . Diabetes mellitus type II   . Hyperlipidemia   . CAD (coronary artery disease)     no prior MI, PCI x 2 in 2009 and 02/2011 in [place]  . GERD (gastroesophageal reflux disease)   . BPH (benign prostatic hyperplasia)   . Granuloma annulare   . Gout   . Hypertension   . Wears dentures     top  . Wears glasses     Past Surgical History  Procedure Laterality Date  . None    . Cardiac catheterization  2009,2012  . Colonoscopy    . Lipoma excision    . Eye surgery      both cataracts  . Blepharoplasty    . Knee arthroscopy with medial menisectomy Right 08/19/2013    Procedure: RIGHT KNEE ARTHROSCOPY WITH PARTIAL MEDIAL MENISECTOMY AND CHONDROPLASTY;  Surgeon: Hessie Dibble, MD;  Location: [place];  Service: Orthopedics;  Laterality: Right;  . Left heart catheterization with coronary angiogram N/A 10/03/2011    Procedure: LEFT HEART CATHETERIZATION WITH CORONARY ANGIOGRAM;  Surgeon: Sinclair Grooms, MD;  Location: [facility] CATH LAB;  Service: Cardiovascular;  Laterality: N/A;  . Left heart catheterization with coronary angiogram N/A 09/12/2012    Procedure: LEFT HEART CATHETERIZATION WITH CORONARY ANGIOGRAM;  Surgeon: Sinclair Grooms, MD;  Location: [facility] CATH LAB;  Service: Cardiovascular;  Laterality: N/A;  . Percutaneous coronary stent intervention (pci-s) N/A 09/17/2012    Procedure: PERCUTANEOUS CORONARY STENT INTERVENTION (PCI-S);  Surgeon:  Sinclair Grooms, MD;  Location: [facility] CATH LAB;  Service: Cardiovascular;  Laterality: N/A;    Allergies: Review of patient's allergies indicates no known allergies.  Medications: Prior to Admission medications   Medication Sig Start Date End Date Taking? Authorizing Provider  albuterol (PROVENTIL HFA) 108 (90 BASE) MCG/ACT inhaler Inhale 2 puffs into the lungs every 6 (six) hours as needed.   Yes Historical Provider, MD  albuterol (PROVENTIL) (2.5 MG/3ML) 0.083% nebulizer solution Take 2.5 mg by nebulization every 6 (six) hours as needed.   Yes Historical Provider, MD  allopurinol (ZYLOPRIM) 300 MG tablet Take 300 mg by mouth daily.   Yes Historical Provider, MD  aspirin EC 81 MG tablet Take 81 mg by mouth daily.   Yes Historical Provider, MD  clopidogrel (PLAVIX) 75 MG tablet Take 75 mg by mouth daily.   Yes Historical Provider, MD  Ferrous Sulfate (IRON) 325 (65 FE) MG TABS Take 1 tablet by mouth daily.   Yes Historical Provider, MD  finasteride (PROSCAR) 5 MG tablet Take 1 tablet (5 mg total) by mouth daily. 11/29/11  Yes Rigoberto Noel, MD  gemfibrozil (LOPID) 600 MG tablet Take 600 mg by mouth daily.   Yes Historical Provider, MD  glyBURIDE (DIABETA) 2.5 MG tablet Take 5 mg by mouth daily with breakfast.    Yes Historical Provider, MD  guaifenesin (HUMIBID E) 400 MG TABS tablet Take 400 mg by mouth every evening.   Yes Historical Provider, MD  lactobacillus  acidophilus & bulgar (LACTINEX) chewable tablet Chew 1 tablet by mouth 2 (two) times daily.   Yes Historical Provider, MD  lisinopril (PRINIVIL,ZESTRIL) 10 MG tablet Take 10 mg by mouth daily.   Yes Historical Provider, MD  metFORMIN (GLUCOPHAGE) 1000 MG tablet Take 1,000 mg by mouth 2 (two) times daily with a meal.   Yes Historical Provider, MD  nitroGLYCERIN (NITROSTAT) 0.4 MG SL tablet Place 1 tablet (0.4 mg total) under the tongue every 5 (five) minutes x 3 doses as needed for chest pain. 04/24/13 06/12/14 Yes Belva Crome, MD    omeprazole (PRILOSEC) 20 MG capsule Take 20 mg by mouth 2 (two) times daily before a meal.    Yes Historical Provider, MD  pravastatin (PRAVACHOL) 40 MG tablet Take 40 mg by mouth daily.   Yes Historical Provider, MD  primidone (MYSOLINE) 50 MG tablet Take 50 mg by mouth at bedtime.    Yes Historical Provider, MD  tiotropium (SPIRIVA) 18 MCG inhalation capsule Place 18 mcg into inhaler and inhale 2 (two) times daily.    Yes Historical Provider, MD  vitamin B-12 (CYANOCOBALAMIN) 1000 MCG tablet Take 1,000 mcg by mouth daily.   Yes Historical Provider, MD    Family History  Problem Relation Age of Onset  . Aortic aneurysm Father   . Aneurysm Mother     brain  . Cancer Brother   . COPD Sister     History   Social History  . Marital Status: Married    Spouse Name: N/A  . Number of Children: N/A  . Years of Education: N/A   Occupational History  . retired    Social History Main Topics  . Smoking status: Former Smoker -- 1.00 packs/day for 64 years    Types: Cigarettes    Quit date: 05/29/2011  . Smokeless tobacco: Never Used  . Alcohol Use: Yes     Comment: rare  . Drug Use: No  . Sexual Activity: Not Currently   Other Topics Concern  . Not on file   Social History Narrative        Review of Systems  Constitutional: Negative for fever and chills.  HENT: Positive for ear pain.        Left parotid swelling  Respiratory: Positive for shortness of breath. Negative for cough.   Cardiovascular: Negative for chest pain.  Gastrointestinal: Negative for nausea, vomiting and blood in stool.  Genitourinary: Negative for dysuria and hematuria.  Musculoskeletal: Negative for back pain.  Neurological: Positive for headaches.  Hematological: Does not bruise/bleed easily.    Vital Signs: BP 139/70 mmHg  Pulse 84  Temp(Src) 97.5 F (36.4 C) (Oral)  Resp 18  Ht 5\' 9"  (1.753 m)  Wt 225 lb (102.059 kg)  BMI 33.21 kg/m2  SpO2 97%  Physical Exam  Constitutional: He is  oriented to person, place, and time. He appears well-developed and well-nourished.  HENT:  Palpable left parotid nodule, mildly tender   Cardiovascular: Normal rate and regular rhythm.   Pulmonary/Chest: Effort normal.  distant BS bilat  Abdominal: Soft. Bowel sounds are normal. There is no tenderness.  obese  Neurological: He is alert and oriented to person, place, and time.    Imaging: [place] Outside Films Body  06/05/2014   CLINICAL DATA:    This exam is stored here for comparison purposes only and was performed at  an outside facility.   Please contact the originating institution for any  associated interpretation or report.  Labs:  CBC:  Recent Labs  08/19/13 1513  HGB 12.2*    COAGS: No results for input(s): INR, APTT in the last 8760 hours.  BMP: No results for input(s): NA, K, CL, CO2, GLUCOSE, BUN, CALCIUM, CREATININE, GFRNONAA, GFRAA in the last 8760 hours.  Invalid input(s): CMP  LIVER FUNCTION TESTS: No results for input(s): BILITOT, AST, ALT, ALKPHOS, PROT, ALBUMIN in the last 8760 hours.  TUMOR MARKERS: No results for input(s): AFPTM, CEA, CA199, CHROMGRNA in the last 8760 hours.  Assessment and Plan: JUSTYCE MERFELD is a 80 y.o. male smoker with several month history of left ear pain and palpable soft tissue mass in parotid region which has persisted despite outpatient antibiotic therapy. Recent imaging has revealed a 2 cm hypervascular left parotid mass. The patient presents today for ultrasound-guided biopsy of the left parotid mass. Patient denies any prior history of cancer.Risks and Benefits discussed with the patient/wife including, but not limited to bleeding, infection, damage to adjacent structures or low yield requiring additional tests. All of the patient's questions were answered, patient is agreeable to proceed. Consent signed and in chart.      Signed: Autumn Messing 06/16/2014, 9:47 AM   I spent a total of 20 minutes face to face in  clinical consultation, greater than 50% of which was counseling/coordinating care for ultrasound-guided left parotid mass biopsy

## 2014-06-16 NOTE — Sedation Documentation (Signed)
C/o pain at site

## 2014-06-16 NOTE — Sedation Documentation (Signed)
Ice pack applied.

## 2014-06-16 NOTE — Discharge Instructions (Signed)
Biopsy Care After These instructions give you information on caring for yourself after your procedure. Your doctor may also give you more specific instructions. Call your doctor if you have any problems or questions after your procedure. HOME CARE   Return to your normal diet and activities as told by your doctor.  Change your bandages (dressings) as told by your doctor. If skin glue (adhesive) was used, it will peel off in 7 days.  Only take medicines as told by your doctor.  Ask your doctor when you can bathe and get your wound wet. GET HELP RIGHT AWAY IF:  You see more than a small spot of blood coming from the wound.  You have redness, puffiness (swelling), or pain.  You see yellowish-white fluid (pus) coming from the wound.  You have a fever.  You notice a bad smell coming from the wound or bandage.  You have a rash, trouble breathing, or any allergy problems. MAKE SURE YOU:   Understand these instructions.  Will watch your condition.  Will get help right away if you are not doing well or get worse. Document Released: 11/29/2010 Document Revised: 06/19/2011 Document Reviewed: 11/29/2010 Exit[employer] Patient Information 2015 [employer]. This information is not intended to replace advice given to you by your health care provider. Make sure you discuss any questions you have with your health care provider.  

## 2014-06-16 NOTE — Procedures (Signed)
Procedure:  Ultrasound guided FNA and core biopsy of left parotid gland mass Findings:  74 G FNA x 4 and 18 G core biopsy x 4 of left parotid gland mass.   No complications. EBL <25 mL

## 2014-06-17 ENCOUNTER — Ambulatory Visit (HOSPITAL_COMMUNITY): Payer: BLUE CROSS/BLUE SHIELD

## 2014-06-18 DIAGNOSIS — K118 Other diseases of salivary glands: Secondary | ICD-10-CM | POA: Diagnosis not present

## 2014-07-10 DIAGNOSIS — K1122 Acute recurrent sialoadenitis: Secondary | ICD-10-CM | POA: Diagnosis not present

## 2014-07-11 ENCOUNTER — Emergency Department (HOSPITAL_COMMUNITY): Payer: Medicare Other

## 2014-07-11 ENCOUNTER — Encounter (HOSPITAL_COMMUNITY): Payer: Self-pay | Admitting: Nurse Practitioner

## 2014-07-11 ENCOUNTER — Emergency Department (HOSPITAL_COMMUNITY)
Admission: EM | Admit: 2014-07-11 | Discharge: 2014-07-11 | Disposition: A | Discharge status: Home / Self Care | Payer: Medicare Other | Attending: Emergency Medicine | Admitting: Emergency Medicine

## 2014-07-11 DIAGNOSIS — E119 Type 2 diabetes mellitus without complications: Secondary | ICD-10-CM | POA: Diagnosis not present

## 2014-07-11 DIAGNOSIS — M109 Gout, unspecified: Secondary | ICD-10-CM | POA: Diagnosis not present

## 2014-07-11 DIAGNOSIS — I2510 Atherosclerotic heart disease of native coronary artery without angina pectoris: Secondary | ICD-10-CM | POA: Diagnosis not present

## 2014-07-11 DIAGNOSIS — R51 Headache: Secondary | ICD-10-CM | POA: Diagnosis present

## 2014-07-11 DIAGNOSIS — L03221 Cellulitis of neck: Secondary | ICD-10-CM | POA: Diagnosis not present

## 2014-07-11 DIAGNOSIS — Z79899 Other long term (current) drug therapy: Secondary | ICD-10-CM | POA: Diagnosis not present

## 2014-07-11 DIAGNOSIS — Z98811 Dental restoration status: Secondary | ICD-10-CM | POA: Insufficient documentation

## 2014-07-11 DIAGNOSIS — E1165 Type 2 diabetes mellitus with hyperglycemia: Secondary | ICD-10-CM | POA: Diagnosis not present

## 2014-07-11 DIAGNOSIS — I10 Essential (primary) hypertension: Secondary | ICD-10-CM | POA: Diagnosis not present

## 2014-07-11 DIAGNOSIS — N400 Enlarged prostate without lower urinary tract symptoms: Secondary | ICD-10-CM | POA: Diagnosis not present

## 2014-07-11 DIAGNOSIS — Z973 Presence of spectacles and contact lenses: Secondary | ICD-10-CM | POA: Insufficient documentation

## 2014-07-11 DIAGNOSIS — Z87891 Personal history of nicotine dependence: Secondary | ICD-10-CM | POA: Insufficient documentation

## 2014-07-11 DIAGNOSIS — R221 Localized swelling, mass and lump, neck: Secondary | ICD-10-CM | POA: Insufficient documentation

## 2014-07-11 DIAGNOSIS — Z7982 Long term (current) use of aspirin: Secondary | ICD-10-CM | POA: Insufficient documentation

## 2014-07-11 DIAGNOSIS — E785 Hyperlipidemia, unspecified: Secondary | ICD-10-CM | POA: Insufficient documentation

## 2014-07-11 DIAGNOSIS — K219 Gastro-esophageal reflux disease without esophagitis: Secondary | ICD-10-CM | POA: Diagnosis not present

## 2014-07-11 DIAGNOSIS — Z9889 Other specified postprocedural states: Secondary | ICD-10-CM | POA: Insufficient documentation

## 2014-07-11 DIAGNOSIS — Z791 Long term (current) use of non-steroidal anti-inflammatories (NSAID): Secondary | ICD-10-CM | POA: Insufficient documentation

## 2014-07-11 LAB — BASIC METABOLIC PANEL
Anion gap: 11 (ref 5–15)
BUN: 19 mg/dL (ref 6–23)
CALCIUM: 8.8 mg/dL (ref 8.4–10.5)
CO2: 22 mmol/L (ref 19–32)
CREATININE: 1.39 mg/dL — AB (ref 0.50–1.35)
Chloride: 99 mmol/L (ref 96–112)
GFR, EST AFRICAN AMERICAN: 55 mL/min — AB (ref >90)
GFR, EST NON AFRICAN AMERICAN: 47 mL/min — AB (ref >90)
Glucose, Bld: 184 mg/dL — ABNORMAL HIGH (ref 70–99)
Potassium: 5.1 mmol/L (ref 3.5–5.1)
Sodium: 132 mmol/L — ABNORMAL LOW (ref 135–145)

## 2014-07-11 LAB — CBC WITH DIFFERENTIAL/PLATELET
Basophils Absolute: 0.1 10*3/uL (ref 0.0–0.1)
Basophils Relative: 1 % (ref 0–1)
EOS ABS: 0.2 10*3/uL (ref 0.0–0.7)
Eosinophils Relative: 2 % (ref 0–5)
HEMATOCRIT: 36.7 % — AB (ref 39.0–52.0)
Hemoglobin: 12.9 g/dL — ABNORMAL LOW (ref 13.0–17.0)
Lymphocytes Relative: 15 % (ref 12–46)
Lymphs Abs: 1.6 10*3/uL (ref 0.7–4.0)
MCH: 29.9 pg (ref 26.0–34.0)
MCHC: 35.1 g/dL (ref 30.0–36.0)
MCV: 85 fL (ref 78.0–100.0)
MONOS PCT: 5 % (ref 3–12)
Monocytes Absolute: 0.6 10*3/uL (ref 0.1–1.0)
Neutro Abs: 8.4 10*3/uL — ABNORMAL HIGH (ref 1.7–7.7)
Neutrophils Relative %: 77 % (ref 43–77)
PLATELETS: 265 10*3/uL (ref 150–400)
RBC: 4.32 MIL/uL (ref 4.22–5.81)
RDW: 13.7 % (ref 11.5–15.5)
WBC: 10.9 10*3/uL — ABNORMAL HIGH (ref 4.0–10.5)

## 2014-07-11 MED ORDER — IOHEXOL 300 MG/ML  SOLN
75.0000 mL | Freq: Once | INTRAMUSCULAR | Status: AC | PRN
  Administered 2014-07-11: 75 mL via INTRAVENOUS

## 2014-07-11 MED ORDER — MORPHINE SULFATE 4 MG/ML IJ SOLN
4.0000 mg | Freq: Once | INTRAMUSCULAR | Status: AC
  Administered 2014-07-11: 4 mg via INTRAVENOUS
  Filled 2014-07-11: qty 1

## 2014-07-11 MED ORDER — CLINDAMYCIN HCL 300 MG PO CAPS
300.0000 mg | ORAL_CAPSULE | Freq: Once | ORAL | Status: AC
  Administered 2014-07-11: 300 mg via ORAL
  Filled 2014-07-11: qty 1

## 2014-07-11 MED ORDER — CLINDAMYCIN HCL 300 MG PO CAPS: 300.0000 mg | ORAL_CAPSULE | Freq: Three times a day (TID) | ORAL | Status: DC

## 2014-07-11 MED ORDER — HYDROMORPHONE HCL 1 MG/ML IJ SOLN
0.5000 mg | INTRAMUSCULAR | Status: AC
  Administered 2014-07-11 (×2): 0.5 mg via INTRAVENOUS
  Filled 2014-07-11 (×2): qty 1

## 2014-07-11 NOTE — Discharge Instructions (Signed)
Cellulitis  Stop cephalexin (keflex).. Instead take the antibiotic prescribed today. Start your first dose at bedtime tonight. You can take up to 2 of the hydrocodone tablets every 4 hours as needed for pain. Call Dr. Berle Mull office in 2 days to arrange to be seen on Monday, 07/13/2014. Return if you feel worse for any reason or if unable to hold down the medicine prescribed. Blood sugar today is mildly elevated at 184 Cellulitis is an infection of the skin and the tissue under the skin. The infected area is usually red and tender. This happens most often in the arms and lower legs. HOME CARE   Take your antibiotic medicine as told. Finish the medicine even if you start to feel better.  Keep the infected arm or leg raised (elevated).  Put a warm cloth on the area up to 4 times per day.  Only take medicines as told by your doctor.  Keep all doctor visits as told. GET HELP IF:  You see red streaks on the skin coming from the infected area.  Your red area gets bigger or turns a dark color.  Your bone or joint under the infected area is painful after the skin heals.  Your infection comes back in the same area or different area.  You have a puffy (swollen) bump in the infected area.  You have new symptoms.  You have a fever. GET HELP RIGHT AWAY IF:   You feel very sleepy.  You throw up (vomit) or have watery poop (diarrhea).  You feel sick and have muscle aches and pains. MAKE SURE YOU:   Understand these instructions.  Will watch your condition.  Will get help right away if you are not doing well or get worse. Document Released: 09/13/2007 Document Revised: 08/11/2013 Document Reviewed: 06/12/2011 [facility] Patient Information 2015 [place], [place]. This information is not intended to replace advice given to you by your health care provider. Make sure you discuss any questions you have with your health care provider.

## 2014-07-11 NOTE — ED Notes (Signed)
Pt had a parotid cyst biopsied earlier this year by Dr Claria Dice and was told it was benign.  He was told that no further treatment would be needed unless the cyst grew. Pt reports Thursday he began to have severe pain and swelling in the area and he went to ENT who started him on cephalexin.  He started the abx last night but the pain is worse today

## 2014-07-11 NOTE — ED Provider Notes (Signed)
CSN: [id number]     Arrival date & time 07/11/14  1245 History   First MD Initiated Contact with Patient 07/11/14 1311     Chief Complaint  Patient presents with  . Facial Pain     (Consider location/radiation/quality/duration/timing/severity/associated sxs/prior Treatment) Patient is a 80 y.o. male presenting with general illness.  Illness Location:  L inferior auricular Quality:  Nothing Severity:  Moderate Onset quality:  Gradual Duration:  2 days Timing:  Constant Progression:  Worsening Chronicity:  Recurrent Context:  06/17/14 parotid mass biopsy Relieved by:  Nothing Worsened by:  Palpation,    Past Medical History  Diagnosis Date  . Diabetes mellitus type II   . Hyperlipidemia   . CAD (coronary artery disease)     no prior MI, PCI x 2 in 2009 and 02/2011 in [place]  . GERD (gastroesophageal reflux disease)   . BPH (benign prostatic hyperplasia)   . Granuloma annulare   . Gout   . Hypertension   . Wears dentures     top  . Wears glasses    Past Surgical History  Procedure Laterality Date  . None    . Cardiac catheterization  2009,2012  . Colonoscopy    . Lipoma excision    . Eye surgery      both cataracts  . Blepharoplasty    . Knee arthroscopy with medial menisectomy Right 08/19/2013    Procedure: RIGHT KNEE ARTHROSCOPY WITH PARTIAL MEDIAL MENISECTOMY AND CHONDROPLASTY;  Surgeon: Hessie Dibble, MD;  Location: [place];  Service: Orthopedics;  Laterality: Right;  . Left heart catheterization with coronary angiogram N/A 10/03/2011    Procedure: LEFT HEART CATHETERIZATION WITH CORONARY ANGIOGRAM;  Surgeon: Sinclair Grooms, MD;  Location: [facility] CATH LAB;  Service: Cardiovascular;  Laterality: N/A;  . Left heart catheterization with coronary angiogram N/A 09/12/2012    Procedure: LEFT HEART CATHETERIZATION WITH CORONARY ANGIOGRAM;  Surgeon: Sinclair Grooms, MD;  Location: [facility] CATH LAB;  Service: Cardiovascular;  Laterality: N/A;  . Percutaneous  coronary stent intervention (pci-s) N/A 09/17/2012    Procedure: PERCUTANEOUS CORONARY STENT INTERVENTION (PCI-S);  Surgeon: Sinclair Grooms, MD;  Location: [facility] CATH LAB;  Service: Cardiovascular;  Laterality: N/A;   Family History  Problem Relation Age of Onset  . Aortic aneurysm Father   . Aneurysm Mother     brain  . Cancer Brother   . COPD Sister    History  Substance Use Topics  . Smoking status: Former Smoker -- 1.00 packs/day for 64 years    Types: Cigarettes    Quit date: 05/29/2011  . Smokeless tobacco: Never Used  . Alcohol Use: Yes     Comment: rare    Review of Systems  All other systems reviewed and are negative.     Allergies  Review of patient's allergies indicates no known allergies.  Home Medications   Prior to Admission medications   Medication Sig Start Date End Date Taking? Authorizing Provider  albuterol (PROVENTIL HFA) 108 (90 BASE) MCG/ACT inhaler Inhale 2 puffs into the lungs every 6 (six) hours as needed.   Yes Historical Provider, MD  albuterol (PROVENTIL) (2.5 MG/3ML) 0.083% nebulizer solution Take 2.5 mg by nebulization every 6 (six) hours as needed.   Yes Historical Provider, MD  allopurinol (ZYLOPRIM) 300 MG tablet Take 300 mg by mouth daily.   Yes Historical Provider, MD  aspirin EC 81 MG tablet Take 81 mg by mouth daily.   Yes Historical Provider,  MD  cephALEXin (KEFLEX) 500 MG capsule Take 500 mg by mouth 4 (four) times daily. For 7 days started on 07-10-14   Yes Historical Provider, MD  clopidogrel (PLAVIX) 75 MG tablet Take 75 mg by mouth daily.   Yes Historical Provider, MD  Ferrous Sulfate (IRON) 325 (65 FE) MG TABS Take 1 tablet by mouth daily.   Yes Historical Provider, MD  finasteride (PROSCAR) 5 MG tablet Take 1 tablet (5 mg total) by mouth daily. 11/29/11  Yes Rigoberto Noel, MD  fluticasone (FLONASE) 50 MCG/ACT nasal spray Place 2 sprays into both nostrils daily.   Yes Historical Provider, MD  gemfibrozil (LOPID) 600 MG tablet Take 600  mg by mouth daily.   Yes Historical Provider, MD  glyBURIDE (DIABETA) 2.5 MG tablet Take 5 mg by mouth daily with breakfast.    Yes Historical Provider, MD  guaifenesin (HUMIBID E) 400 MG TABS tablet Take 400 mg by mouth every evening.   Yes Historical Provider, MD  HYDROcodone-acetaminophen (NORCO/VICODIN) 5-325 MG per tablet Take 1 tablet by mouth every 6 (six) hours as needed for moderate pain.   Yes Historical Provider, MD  lactobacillus acidophilus & bulgar (LACTINEX) chewable tablet Chew 1 tablet by mouth 2 (two) times daily.   Yes Historical Provider, MD  lisinopril (PRINIVIL,ZESTRIL) 10 MG tablet Take 10 mg by mouth daily.   Yes Historical Provider, MD  metFORMIN (GLUCOPHAGE) 1000 MG tablet Take 1,000 mg by mouth 2 (two) times daily with a meal.   Yes Historical Provider, MD  omeprazole (PRILOSEC) 20 MG capsule Take 20 mg by mouth 2 (two) times daily before a meal.    Yes Historical Provider, MD  pravastatin (PRAVACHOL) 40 MG tablet Take 40 mg by mouth daily.   Yes Historical Provider, MD  primidone (MYSOLINE) 50 MG tablet Take 50 mg by mouth at bedtime.    Yes Historical Provider, MD  tiotropium (SPIRIVA) 18 MCG inhalation capsule Place 18 mcg into inhaler and inhale 2 (two) times daily.    Yes Historical Provider, MD  vitamin B-12 (CYANOCOBALAMIN) 1000 MCG tablet Take 1,000 mcg by mouth daily.   Yes Historical Provider, MD  clindamycin (CLEOCIN) 300 MG capsule Take 1 capsule (300 mg total) by mouth 3 (three) times daily. For 10 days 07/11/14   Orlie Dakin, MD  nitroGLYCERIN (NITROSTAT) 0.4 MG SL tablet Place 1 tablet (0.4 mg total) under the tongue every 5 (five) minutes x 3 doses as needed for chest pain. 04/24/13 06/12/14  Belva Crome, MD   BP 135/86 mmHg  Pulse 91  Temp(Src) 98.1 F (36.7 C) (Oral)  Resp 20  SpO2 91% Physical Exam  Constitutional: He is oriented to person, place, and time. He appears well-developed and well-nourished.  HENT:  Head: Normocephalic and atraumatic.   Right Ear: Tympanic membrane and ear canal normal.  Left Ear: Tympanic membrane and ear canal normal.  5 cm submandibular/inferior auricular swelling with overlying erythema  Eyes: Conjunctivae and EOM are normal.  Neck: Normal range of motion. Neck supple.  Cardiovascular: Normal rate, regular rhythm and normal heart sounds.   Pulmonary/Chest: Effort normal and breath sounds normal. No respiratory distress.  Abdominal: He exhibits no distension. There is no tenderness. There is no rebound and no guarding.  Musculoskeletal: Normal range of motion.  Neurological: He is alert and oriented to person, place, and time.  Skin: Skin is warm and dry.  Vitals reviewed.   ED Course  Procedures (including critical care time) Labs Review Labs Reviewed  CBC WITH DIFFERENTIAL/PLATELET - Abnormal; Notable for the following:    WBC 10.9 (*)    Hemoglobin 12.9 (*)    HCT 36.7 (*)    Neutro Abs 8.4 (*)    All other components within normal limits  BASIC METABOLIC PANEL - Abnormal; Notable for the following:    Sodium 132 (*)    Glucose, Bld 184 (*)    Creatinine, Ser 1.39 (*)    GFR calc non Af Amer 47 (*)    GFR calc Af Amer 55 (*)    All other components within normal limits    Imaging Review No results found.   EKG Interpretation None      MDM   Final diagnoses:  Localized swelling, mass and lump, neck  Cellulitis of neck    80 y.o. male with pertinent PMH of parotid cyst with biopsy 3/19 presents with ear pain on same side (L), seen by ENT for same, placed on keflex.  No improvement, so presented today.  On arrival today, physical exam demonstrated L inferior auricular swelling, erythema.  CT scan ordered.  Pt care to Dr. Winfred Leeds pending CT results  I have reviewed all laboratory and imaging studies if ordered as above  1. Cellulitis of neck   2. Localized swelling, mass and lump, neck         [name], MD 07/13/14 [id number]

## 2014-07-11 NOTE — ED Provider Notes (Signed)
5:10 PM patient requesting pain medicine he is alert Glasgow Coma Score 15 HEENT exam swollen red and tender over left angle of jaw. No trismus. Handling secretions well. Alert nontoxic. 6:20 PM patient's pain is under control after treatment with intravenous morphine. I spoke with Dr.Kraus. Plan discontinue Keflex. Prescription clindamycin 300 mg 3 times a day for 10 days. He can increase hydrocodone to 10 mg every 4 hours when necessary pain. Follow-up with Dr. Ernesto Rutherford in 2 days Results for orders placed or performed during the hospital encounter of 07/11/14  CBC with Differential  Result Value Ref Range   WBC 10.9 (H) 4.0 - 10.5 K/uL   RBC 4.32 4.22 - 5.81 MIL/uL   Hemoglobin 12.9 (L) 13.0 - 17.0 g/dL   HCT 36.7 (L) 39.0 - 52.0 %   MCV 85.0 78.0 - 100.0 fL   MCH 29.9 26.0 - 34.0 pg   MCHC 35.1 30.0 - 36.0 g/dL   RDW 13.7 11.5 - 15.5 %   Platelets 265 150 - 400 K/uL   Neutrophils Relative % 77 43 - 77 %   Neutro Abs 8.4 (H) 1.7 - 7.7 K/uL   Lymphocytes Relative 15 12 - 46 %   Lymphs Abs 1.6 0.7 - 4.0 K/uL   Monocytes Relative 5 3 - 12 %   Monocytes Absolute 0.6 0.1 - 1.0 K/uL   Eosinophils Relative 2 0 - 5 %   Eosinophils Absolute 0.2 0.0 - 0.7 K/uL   Basophils Relative 1 0 - 1 %   Basophils Absolute 0.1 0.0 - 0.1 K/uL  Basic metabolic panel  Result Value Ref Range   Sodium 132 (L) 135 - 145 mmol/L   Potassium 5.1 3.5 - 5.1 mmol/L   Chloride 99 96 - 112 mmol/L   CO2 22 19 - 32 mmol/L   Glucose, Bld 184 (H) 70 - 99 mg/dL   BUN 19 6 - 23 mg/dL   Creatinine, Ser 1.39 (H) 0.50 - 1.35 mg/dL   Calcium 8.8 8.4 - 10.5 mg/dL   GFR calc non Af Amer 47 (L) >90 mL/min   GFR calc Af Amer 55 (L) >90 mL/min   Anion gap 11 5 - 15   Ct Soft Tissue Neck W Contrast  07/11/2014   CLINICAL DATA:  Left-sided neck swelling and pain below the left ear. This is been present for a few weeks but worsening recently.  EXAM: CT NECK WITH CONTRAST  TECHNIQUE: Multidetector CT imaging of the neck was  performed using the standard protocol following the bolus administration of intravenous contrast.  CONTRAST:  12mL OMNIPAQUE IOHEXOL 300 MG/ML  SOLN  COMPARISON:  06/16/2014 ultrasound  FINDINGS: Pharynx and larynx: Unremarkable  Salivary glands: Indistinct mass in the left parotid gland posteriorly, proximally 2.9 by 1.6 cm on image 35 series 201. Adjacent inflammatory stranding in the subcutaneous tissues and tracking along the left platysma muscle.  Thyroid: Unremarkable  Lymph nodes: No pathologically enlarged nodes seen.  Vascular: Aortic arch and branch vessel atherosclerotic calcification including the common carotid arteries. There is atherosclerotic calcification of the cavernous carotid arteries bilaterally.  Limited intracranial: Unremarkable  Visualized orbits: Unremarkable  Mastoids and visualized paranasal sinuses: New mild chronic ethmoid and right maxillary sinusitis.  Skeleton: Considerable cervical spondylosis and degenerative disc disease causing multilevel prominent foraminal impingement and central narrowing of the thecal sac at C4-5. Note is also made of a small lipoma along the posterior neck musculature to the right of midline on image 39 of series 201, measuring  1.8 cm in diameter.  Upper chest: Unremarkable  IMPRESSION: 1. 2.9 by 1.6 cm left parotid mass, about the same as on prior imaging. 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 There is some adjacent inflammatory stranding in the vicinity of the left parotid and especially in the overlying subcutaneous tissues, with slight thickening of the platysma muscle, but no findings for abscess at no over reactive adenopathy. Presumably the subcutaneous stranding appearance could be due to low-grade infection such as cellulitis.   Electronically Signed   By: Van Clines M.D.   On: 07/11/2014 17:26   US Biopsy  06/16/2014   CLINICAL DATA:  Left parotid gland mass measuring approximately 2 cm by prior ultrasound  imaging. The patient presents for biopsy.  EXAM: ULTRASOUND GUIDED NEEDLE ASPIRATE AND CORE BIOPSY OF LEFT PAROTID MASS  MEDICATIONS: 1.5 mg IV Versed; 75 mcg IV Fentanyl  Total Moderate Sedation Time: 25 minutes  COMPARISON:  Ultrasound performed at [facility] on 06/04/2014  PROCEDURE: The procedure, risks, benefits, and alternatives were explained to the patient. Questions regarding the procedure were encouraged and answered. The patient understands and consents to the procedure.  The left face was prepped with Betadine in a sterile fashion, and a sterile drape was applied covering the operative field. A sterile gown and sterile gloves were used for the procedure. Local anesthesia was provided with 1% Lidocaine. A time-out was performed prior to the procedure.  Ultrasound was used to localize a left parotid mass lesion. Initial 22 gauge fine needle aspirate samples were obtained for cytologic analysis. A total of 4 needle aspirate samples were obtained.  Core biopsy was then performed with an 18 gauge core biopsy device. Four core biopsy samples were obtained and submitted in saline.  COMPLICATIONS: None.  FINDINGS: Hypoechoic and lobulated mass of the left parotid gland was localized. This measures approximately 2 cm in greatest dimension. Both needle aspirate and core biopsy was performed to aid in pathologic analysis.  IMPRESSION: Ultrasound-guided needle aspirate and core biopsy performed of a mass within the left parotid gland.   Electronically Signed   By: Aletta Edouard M.D.   On: 06/16/2014 17:15   Note that pulse oximetry transiently dropped to the high 80s however, all her patient denied any shortness of breath and exibits no resp distress Dx #1 cellulitis #2 hyperglycemia #3chronic renal insuficency  Orlie Dakin, MD 07/11/14 1839

## 2014-07-13 DIAGNOSIS — K118 Other diseases of salivary glands: Secondary | ICD-10-CM | POA: Diagnosis not present

## 2014-07-16 ENCOUNTER — Telehealth: Payer: Self-pay | Admitting: Interventional Cardiology

## 2014-07-16 DIAGNOSIS — K118 Other diseases of salivary glands: Secondary | ICD-10-CM | POA: Diagnosis not present

## 2014-07-16 NOTE — Telephone Encounter (Signed)
Follow Up  Pt requests a call back from the nurse to talk to Dr. Tamala Julian himself about a surgery that he may have to have. Remove a syst on his Parotid gland. Surgery not scheduled at this time but the pt is requesting a call back to discuss if the it is safe to have. Please call

## 2014-07-16 NOTE — Telephone Encounter (Signed)
Spoke to the patient.

## 2014-07-16 NOTE — Telephone Encounter (Signed)
Spoke to patient. Will be okay to hold Plavix and have parotid surgery. Dr. Claria Dice to per

## 2014-07-16 NOTE — Telephone Encounter (Signed)
Patient verified that he wants to speak directly to Dr. Tamala Julian. Will route message to Dr. Tamala Julian.

## 2014-07-16 NOTE — Telephone Encounter (Signed)
Routing to [employer], CMA, for future reference.

## 2014-07-23 ENCOUNTER — Encounter (HOSPITAL_COMMUNITY): Payer: Self-pay | Admitting: Emergency Medicine

## 2014-07-23 DIAGNOSIS — I10 Essential (primary) hypertension: Secondary | ICD-10-CM | POA: Insufficient documentation

## 2014-07-23 DIAGNOSIS — Z7982 Long term (current) use of aspirin: Secondary | ICD-10-CM | POA: Diagnosis not present

## 2014-07-23 DIAGNOSIS — Z7902 Long term (current) use of antithrombotics/antiplatelets: Secondary | ICD-10-CM | POA: Diagnosis not present

## 2014-07-23 DIAGNOSIS — R079 Chest pain, unspecified: Secondary | ICD-10-CM | POA: Insufficient documentation

## 2014-07-23 DIAGNOSIS — Z973 Presence of spectacles and contact lenses: Secondary | ICD-10-CM | POA: Diagnosis not present

## 2014-07-23 DIAGNOSIS — Z87891 Personal history of nicotine dependence: Secondary | ICD-10-CM | POA: Diagnosis not present

## 2014-07-23 DIAGNOSIS — Z79899 Other long term (current) drug therapy: Secondary | ICD-10-CM | POA: Diagnosis not present

## 2014-07-23 DIAGNOSIS — K219 Gastro-esophageal reflux disease without esophagitis: Secondary | ICD-10-CM | POA: Diagnosis not present

## 2014-07-23 DIAGNOSIS — Z972 Presence of dental prosthetic device (complete) (partial): Secondary | ICD-10-CM | POA: Diagnosis not present

## 2014-07-23 DIAGNOSIS — E119 Type 2 diabetes mellitus without complications: Secondary | ICD-10-CM | POA: Diagnosis not present

## 2014-07-23 DIAGNOSIS — R0789 Other chest pain: Secondary | ICD-10-CM | POA: Diagnosis not present

## 2014-07-23 DIAGNOSIS — M109 Gout, unspecified: Secondary | ICD-10-CM | POA: Diagnosis not present

## 2014-07-23 DIAGNOSIS — E785 Hyperlipidemia, unspecified: Secondary | ICD-10-CM | POA: Diagnosis not present

## 2014-07-23 DIAGNOSIS — R0602 Shortness of breath: Secondary | ICD-10-CM | POA: Diagnosis not present

## 2014-07-23 DIAGNOSIS — I2510 Atherosclerotic heart disease of native coronary artery without angina pectoris: Secondary | ICD-10-CM | POA: Diagnosis not present

## 2014-07-23 DIAGNOSIS — Z7951 Long term (current) use of inhaled steroids: Secondary | ICD-10-CM | POA: Diagnosis not present

## 2014-07-23 DIAGNOSIS — Z87438 Personal history of other diseases of male genital organs: Secondary | ICD-10-CM | POA: Insufficient documentation

## 2014-07-23 DIAGNOSIS — R110 Nausea: Secondary | ICD-10-CM | POA: Diagnosis not present

## 2014-07-23 NOTE — ED Notes (Signed)
Pt reports Tuesday his son passed away (cardiac arrest). Since then pt has had chest pressure with occasional "electric shock" feeling. Pt has stents.

## 2014-07-24 ENCOUNTER — Emergency Department (HOSPITAL_COMMUNITY): Payer: Medicare Other

## 2014-07-24 ENCOUNTER — Emergency Department (HOSPITAL_COMMUNITY)
Admission: EM | Admit: 2014-07-24 | Discharge: 2014-07-24 | Disposition: A | Discharge status: Home / Self Care | Payer: Medicare Other | Attending: Emergency Medicine | Admitting: Emergency Medicine

## 2014-07-24 ENCOUNTER — Telehealth: Payer: Self-pay | Admitting: Interventional Cardiology

## 2014-07-24 DIAGNOSIS — R079 Chest pain, unspecified: Secondary | ICD-10-CM | POA: Diagnosis not present

## 2014-07-24 DIAGNOSIS — R0602 Shortness of breath: Secondary | ICD-10-CM | POA: Diagnosis not present

## 2014-07-24 LAB — BASIC METABOLIC PANEL
Anion gap: 13 (ref 5–15)
BUN: 30 mg/dL — ABNORMAL HIGH (ref 6–23)
CALCIUM: 9.6 mg/dL (ref 8.4–10.5)
CO2: 19 mmol/L (ref 19–32)
CREATININE: 1.9 mg/dL — AB (ref 0.50–1.35)
Chloride: 104 mmol/L (ref 96–112)
GFR calc Af Amer: 38 mL/min — ABNORMAL LOW (ref >90)
GFR, EST NON AFRICAN AMERICAN: 32 mL/min — AB (ref >90)
GLUCOSE: 164 mg/dL — AB (ref 70–99)
Potassium: 4.4 mmol/L (ref 3.5–5.1)
Sodium: 136 mmol/L (ref 135–145)

## 2014-07-24 LAB — CBC
HCT: 34.4 % — ABNORMAL LOW (ref 39.0–52.0)
Hemoglobin: 11.8 g/dL — ABNORMAL LOW (ref 13.0–17.0)
MCH: 29.2 pg (ref 26.0–34.0)
MCHC: 34.3 g/dL (ref 30.0–36.0)
MCV: 85.1 fL (ref 78.0–100.0)
Platelets: 302 10*3/uL (ref 150–400)
RBC: 4.04 MIL/uL — AB (ref 4.22–5.81)
RDW: 13.8 % (ref 11.5–15.5)
WBC: 6.8 10*3/uL (ref 4.0–10.5)

## 2014-07-24 LAB — I-STAT TROPONIN, ED
Troponin i, poc: 0.01 ng/mL (ref 0.00–0.08)
Troponin i, poc: 0.01 ng/mL (ref 0.00–0.08)

## 2014-07-24 MED ORDER — ISOSORBIDE MONONITRATE ER 60 MG PO TB24: 60.0000 mg | ORAL_TABLET | Freq: Every day | ORAL | Status: DC

## 2014-07-24 MED ORDER — LORAZEPAM 0.5 MG PO TABS: 0.5000 mg | ORAL_TABLET | Freq: Three times a day (TID) | ORAL | Status: DC | PRN

## 2014-07-24 MED ORDER — NITROGLYCERIN 0.4 MG SL SUBL
0.4000 mg | SUBLINGUAL_TABLET | SUBLINGUAL | Status: DC | PRN
  Administered 2014-07-24: 0.4 mg via SUBLINGUAL
  Filled 2014-07-24: qty 1

## 2014-07-24 MED ORDER — ASPIRIN 325 MG PO TABS
325.0000 mg | ORAL_TABLET | Freq: Once | ORAL | Status: AC
  Administered 2014-07-24: 325 mg via ORAL
  Filled 2014-07-24: qty 1

## 2014-07-24 NOTE — Telephone Encounter (Signed)
Pt calling Dr Tamala Julian today to inform him that he was seen in the ER last night for chest pain, and anxiety caused by recent death of his son on this past 2022/07/28.  Pt states he was discharged from the ER early this morning and was instructed to call Dr Tamala Julian today for further follow-up.  Went and spoke with Dr Tamala Julian about the pts current situation, complaints, and ER discharge notes.  Per Dr Tamala Julian, he recommends the pt start taking Imdur 60 mg po daily, and follow-up as needed until recall date in November 2016.  Informed the pt of Dr Thompson Caul recommendations and confirmed the pharmacy of choice with the pt.  Condolences given to the pt.  Dr Tamala Julian also contacted the pt by phone to endorse new med order and condolences for the loss of his son. Pt verbalized understanding and agrees with this plan.

## 2014-07-24 NOTE — Telephone Encounter (Signed)
New Message  Pt d/c from ED this am. Per pt- per Dr. Colin Rhein he is to see Dr. Tamala Julian today. Note in AVS-appointment as soon as possible for a visit with Your cardiologist.  Pt says his son died this week and he has been under a great amount of stress and feels this may have caused the anxiety attack that led to his ED stay.  Pt wanted to speak w/ Rn about coming in Today. Please call back and discuss,.

## 2014-07-24 NOTE — ED Provider Notes (Signed)
CSN: [id number]     Arrival date & time 07/23/14  2341 History  This chart was scribed for Marco Freiberg, MD by Eustaquio Maize, ED Scribe. This patient was seen in room B14C/B14C and the patient's care was started at 12:19 AM.    Chief Complaint  Patient presents with  . Chest Pain   Patient is a 80 y.o. male presenting with chest pain. The history is provided by the patient. No language interpreter was used.  Chest Pain Pain quality: pressure   Pain radiates to:  Does not radiate Pain severity:  Moderate Onset quality:  Sudden Duration:  2 days Timing:  Intermittent Progression:  Worsening Chronicity:  New Context: stress   Ineffective treatments:  Nitroglycerin Associated symptoms: nausea   Associated symptoms: not vomiting   Risk factors: coronary artery disease, diabetes mellitus, hypertension and male sex      HPI Comments: Marco Acevedo is a 80 y.o. male with hx DM, HLD, CAD, HTN, who presents to the Emergency Department complaining of intermittent, chest pressure that began 2 days ago, worsening tonight. Pt mentions that he began having symptoms after his son passed away from cardiac arrest. Pt has been taking NTGs for the past couple of days with mild relief. He mentions that he was at his son's house earlier tonight and began having pressure again tonight which he states was worse than the usual pressure. He rates the pressure as a 6/10 on the pain scale. He notes that he was also having a shocking sensation to his chest every once and awhile, causing pt to come to the ED. He took 2 NTGs tonight with no relief. He also took his blood pressure which he states was concerning for him. Pt believes that the pressure is related to his recent stress. Pt also complains of nausea. He denies vomiting or any other symptoms.    Past Medical History  Diagnosis Date  . Diabetes mellitus type II   . Hyperlipidemia   . CAD (coronary artery disease)     no prior MI, PCI x 2 in 2009 and  02/2011 in [place]  . GERD (gastroesophageal reflux disease)   . BPH (benign prostatic hyperplasia)   . Granuloma annulare   . Gout   . Hypertension   . Wears dentures     top  . Wears glasses    Past Surgical History  Procedure Laterality Date  . None    . Cardiac catheterization  2009,2012  . Colonoscopy    . Lipoma excision    . Eye surgery      both cataracts  . Blepharoplasty    . Knee arthroscopy with medial menisectomy Right 08/19/2013    Procedure: RIGHT KNEE ARTHROSCOPY WITH PARTIAL MEDIAL MENISECTOMY AND CHONDROPLASTY;  Surgeon: Hessie Dibble, MD;  Location: [facility];  Service: Orthopedics;  Laterality: Right;  . Left heart catheterization with coronary angiogram N/A 10/03/2011    Procedure: LEFT HEART CATHETERIZATION WITH CORONARY ANGIOGRAM;  Surgeon: Sinclair Grooms, MD;  Location: [facility] CATH LAB;  Service: Cardiovascular;  Laterality: N/A;  . Left heart catheterization with coronary angiogram N/A 09/12/2012    Procedure: LEFT HEART CATHETERIZATION WITH CORONARY ANGIOGRAM;  Surgeon: Sinclair Grooms, MD;  Location: [facility] CATH LAB;  Service: Cardiovascular;  Laterality: N/A;  . Percutaneous coronary stent intervention (pci-s) N/A 09/17/2012    Procedure: PERCUTANEOUS CORONARY STENT INTERVENTION (PCI-S);  Surgeon: Sinclair Grooms, MD;  Location: [facility] CATH LAB;  Service: Cardiovascular;  Laterality: N/A;   Family History  Problem Relation Age of Onset  . Aortic aneurysm Father   . Aneurysm Mother     brain  . Cancer Brother   . COPD Sister    History  Substance Use Topics  . Smoking status: Former Smoker -- 1.00 packs/day for 64 years    Types: Cigarettes    Quit date: 05/29/2011  . Smokeless tobacco: Never Used  . Alcohol Use: Yes     Comment: rare    Review of Systems  Cardiovascular: Positive for chest pain.  Gastrointestinal: Positive for nausea. Negative for vomiting.  All other systems reviewed and are negative.     Allergies  Review  of patient's allergies indicates no known allergies.  Home Medications   Prior to Admission medications   Medication Sig Start Date End Date Taking? Authorizing Provider  albuterol (PROVENTIL HFA) 108 (90 BASE) MCG/ACT inhaler Inhale 2 puffs into the lungs every 6 (six) hours as needed for wheezing or shortness of breath.    Yes Historical Provider, MD  albuterol (PROVENTIL) (2.5 MG/3ML) 0.083% nebulizer solution Take 2.5 mg by nebulization every 6 (six) hours as needed for wheezing or shortness of breath.    Yes Historical Provider, MD  allopurinol (ZYLOPRIM) 100 MG tablet Take 100 mg by mouth daily.   Yes Historical Provider, MD  aspirin EC 81 MG tablet Take 81 mg by mouth daily.   Yes Historical Provider, MD  clopidogrel (PLAVIX) 75 MG tablet Take 75 mg by mouth daily.   Yes Historical Provider, MD  Ferrous Sulfate (IRON) 325 (65 FE) MG TABS Take 1 tablet by mouth daily.   Yes Historical Provider, MD  finasteride (PROSCAR) 5 MG tablet Take 1 tablet (5 mg total) by mouth daily. 11/29/11  Yes Rigoberto Noel, MD  gemfibrozil (LOPID) 600 MG tablet Take 600 mg by mouth daily.   Yes Historical Provider, MD  glyBURIDE (DIABETA) 2.5 MG tablet Take 5 mg by mouth daily with breakfast.    Yes Historical Provider, MD  guaifenesin (HUMIBID E) 400 MG TABS tablet Take 800 mg by mouth daily as needed (cough).    Yes Historical Provider, MD  HYDROcodone-acetaminophen (NORCO/VICODIN) 5-325 MG per tablet Take 1 tablet by mouth every 6 (six) hours as needed for moderate pain.   Yes Historical Provider, MD  lisinopril (PRINIVIL,ZESTRIL) 10 MG tablet Take 10 mg by mouth daily.   Yes Historical Provider, MD  metFORMIN (GLUCOPHAGE) 1000 MG tablet Take 1,000 mg by mouth 2 (two) times daily with a meal.   Yes Historical Provider, MD  nitroGLYCERIN (NITROSTAT) 0.4 MG SL tablet Place 1 tablet (0.4 mg total) under the tongue every 5 (five) minutes x 3 doses as needed for chest pain. 04/24/13 07/24/14 Yes Belva Crome, MD   omeprazole (PRILOSEC) 20 MG capsule Take 20 mg by mouth daily.    Yes Historical Provider, MD  pravastatin (PRAVACHOL) 40 MG tablet Take 40 mg by mouth daily.   Yes Historical Provider, MD  primidone (MYSOLINE) 50 MG tablet Take 50 mg by mouth at bedtime.    Yes Historical Provider, MD  tiotropium (SPIRIVA) 18 MCG inhalation capsule Place 36 mcg into inhaler and inhale every morning.    Yes Historical Provider, MD  vitamin B-12 (CYANOCOBALAMIN) 1000 MCG tablet Take 1,000 mcg by mouth daily.   Yes Historical Provider, MD  allopurinol (ZYLOPRIM) 300 MG tablet Take 300 mg by mouth daily.    Historical Provider, MD  cephALEXin (KEFLEX) 500 MG  capsule Take 500 mg by mouth 4 (four) times daily. For 7 days started on 07-10-14    Historical Provider, MD  clindamycin (CLEOCIN) 300 MG capsule Take 1 capsule (300 mg total) by mouth 3 (three) times daily. For 10 days Patient not taking: Reported on 07/24/2014 07/11/14   Orlie Dakin, MD  fluticasone [facility]) 50 MCG/ACT nasal spray Place 2 sprays into both nostrils daily.    Historical Provider, MD  lactobacillus acidophilus & bulgar (LACTINEX) chewable tablet Chew 1 tablet by mouth 2 (two) times daily.    Historical Provider, MD  LORazepam (ATIVAN) 0.5 MG tablet Take 1 tablet (0.5 mg total) by mouth 3 (three) times daily as needed for anxiety. 07/24/14   Marco Freiberg, MD   Triage Vitals: BP 161/86 mmHg  Pulse 92  Temp(Src) 98.1 F (36.7 C) (Oral)  Resp 20  Ht 5\' 8"  (1.727 m)  Wt 220 lb (99.791 kg)  BMI 33.46 kg/m2  SpO2 96%   Physical Exam  Constitutional: He is oriented to person, place, and time. He appears well-developed and well-nourished.  HENT:  Head: Normocephalic and atraumatic.  Eyes: Conjunctivae and EOM are normal.  Neck: Normal range of motion. Neck supple.  Cardiovascular: Normal rate, regular rhythm and normal heart sounds.   Pulmonary/Chest: Effort normal and breath sounds normal. No respiratory distress.  Abdominal: He exhibits no  distension. There is no tenderness. There is no rebound and no guarding.  Musculoskeletal: Normal range of motion.  Neurological: He is alert and oriented to person, place, and time.  Skin: Skin is warm and dry.  Vitals reviewed.   ED Course  Procedures (including critical care time)  DIAGNOSTIC STUDIES: Oxygen Saturation is 96% on RA, normal by my interpretation.    COORDINATION OF CARE: 12:29 AM-Discussed treatment plan which includes CXR with pt at bedside and pt agreed to plan.   Labs Review Labs Reviewed  CBC - Abnormal; Notable for the following:    RBC 4.04 (*)    Hemoglobin 11.8 (*)    HCT 34.4 (*)    All other components within normal limits  BASIC METABOLIC PANEL - Abnormal; Notable for the following:    Glucose, Bld 164 (*)    BUN 30 (*)    Creatinine, Ser 1.90 (*)    GFR calc non Af Amer 32 (*)    GFR calc Af Amer 38 (*)    All other components within normal limits  I-STAT TROPOININ, ED  Randolm Idol, ED    Imaging Review Dg Chest 2 View  07/24/2014   CLINICAL DATA:  Chest pain and shortness of breath for 2 days.  EXAM: CHEST  2 VIEW  COMPARISON:  Portable view 08/31/2012  FINDINGS: Mild hyperinflation, coarse interstitial markings. The heart is at the upper limits of normal in size. Mediastinal contours are normal. Pulmonary vasculature is normal. No consolidation, pleural effusion, or pneumothorax. No acute osseous abnormalities are seen.  IMPRESSION: Mild hyperinflation and coarse interstitial markings, may reflect bronchitis. No evidence of pneumonia.   Electronically Signed   By: Jeb Levering M.D.   On: 07/24/2014 00:45     EKG Interpretation   Date/Time:  Thursday July 23 2014 23:48:54 EDT Ventricular Rate:  92 PR Interval:  156 QRS Duration: 70 QT Interval:  336 QTC Calculation: 415 R Axis:   45 Text Interpretation:  Sinus rhythm with marked sinus arrhythmia Cannot  rule out Anterior infarct , age undetermined Abnormal ECG No significant   change since last tracing Confirmed  by Marco Acevedo [phone]) on  07/24/2014 12:07:32 AM      MDM   Final diagnoses:  Chest pain, unspecified chest pain type    80 y.o. male with pertinent PMH of CAD, DM presents with chest pain as above.  Symptoms nonexertional and occur in context of extreme emotional stress with recent sudden death of his son.  On arrival vitals and physical exam as above.  Symptoms relieved after nitro and time.  Chest pain free.  As symptoms have been intermittent and associated with emotional stress with negative delta troponin here, consider ACS unlikely, however stressed importance of cardiology fu and return with any worsening symptoms at length with pt.  Final troponin 6 hours from symptom onset today. He voiced understanding and was agreeable with plan.  DC home in stable condition.    I have reviewed all laboratory and imaging studies if ordered as above  1. Chest pain, unspecified chest pain type           Marco Freiberg, MD 07/24/14 [phone]

## 2014-07-24 NOTE — ED Notes (Signed)
MD at bedside. 

## 2014-07-24 NOTE — Discharge Instructions (Signed)

## 2014-08-20 DIAGNOSIS — K8020 Calculus of gallbladder without cholecystitis without obstruction: Secondary | ICD-10-CM | POA: Diagnosis not present

## 2014-09-01 ENCOUNTER — Telehealth: Payer: Self-pay

## 2014-09-01 NOTE — Telephone Encounter (Signed)
Cardiac clearance place din MR nurse fax box to fax to [facility]

## 2014-09-04 ENCOUNTER — Other Ambulatory Visit: Payer: Self-pay | Admitting: General Surgery

## 2014-09-15 NOTE — Pre-Procedure Instructions (Signed)
Marco Acevedo  09/15/2014      [facility] - [place], [place] - [id number] [place] [address] Phone: [phone] Fax: [phone]    Your procedure is scheduled on: Wednesday September 23, 2014 at 8:30 AM.  Report to [facility] Admitting at 6:30 AM  Call this number if you have problems the morning of surgery: [phone]    Remember:  Do not eat food or drink liquids after midnight.  Take these medicines the morning of surgery with A SIP OF WATER: Albuterol inhaler/nebulizer, Allopurinol (Zyloprim), Finasteride (Proscar),  Hydrocodone if needed, Isosorbide (Imdur), Omeprazole (Prilosec), Spiriva inhaler   Do NOT take any diabetic medications the morning of your surgery   Follow your physicians orders regarding Plavix    Stop taking any aspirin, vitamins, herbal medications, Ibuprofen, Advil, Motrin, Aleve   Do not wear jewelry, make-up or nail polish.  Do not wear lotions, powders, or perfumes.    Do not shave 48 hours prior to surgery.   Do not bring valuables to the hospital.  [facility] is not responsible for any belongings or valuables.  Contacts, dentures or bridgework may not be worn into surgery.  Leave your suitcase in the car.  After surgery it may be brought to your room.  For patients admitted to the hospital, discharge time will be determined by your treatment team.  Patients discharged the day of surgery will not be allowed to drive home.   Name and phone number of your driver:    Special instructions: Shower using CHG soap the night before and the morning of your surgery  Please read over the following fact sheets that you were given. Pain Booklet, Coughing and Deep Breathing and Surgical Site Infection Prevention

## 2014-09-16 ENCOUNTER — Encounter (HOSPITAL_COMMUNITY)
Admission: RE | Admit: 2014-09-16 | Discharge: 2014-09-16 | Disposition: A | Discharge status: Home / Self Care | Payer: Medicare Other | Source: Ambulatory Visit | Attending: General Surgery | Admitting: General Surgery

## 2014-09-16 ENCOUNTER — Encounter (HOSPITAL_COMMUNITY): Payer: Self-pay

## 2014-09-16 DIAGNOSIS — Z7902 Long term (current) use of antithrombotics/antiplatelets: Secondary | ICD-10-CM | POA: Insufficient documentation

## 2014-09-16 DIAGNOSIS — I2510 Atherosclerotic heart disease of native coronary artery without angina pectoris: Secondary | ICD-10-CM | POA: Insufficient documentation

## 2014-09-16 DIAGNOSIS — J449 Chronic obstructive pulmonary disease, unspecified: Secondary | ICD-10-CM | POA: Diagnosis not present

## 2014-09-16 DIAGNOSIS — Z01812 Encounter for preprocedural laboratory examination: Secondary | ICD-10-CM | POA: Diagnosis not present

## 2014-09-16 DIAGNOSIS — Z87891 Personal history of nicotine dependence: Secondary | ICD-10-CM | POA: Insufficient documentation

## 2014-09-16 DIAGNOSIS — E119 Type 2 diabetes mellitus without complications: Secondary | ICD-10-CM | POA: Diagnosis not present

## 2014-09-16 DIAGNOSIS — Z79899 Other long term (current) drug therapy: Secondary | ICD-10-CM | POA: Insufficient documentation

## 2014-09-16 DIAGNOSIS — I10 Essential (primary) hypertension: Secondary | ICD-10-CM | POA: Insufficient documentation

## 2014-09-16 DIAGNOSIS — Z01818 Encounter for other preprocedural examination: Secondary | ICD-10-CM | POA: Diagnosis not present

## 2014-09-16 DIAGNOSIS — Z7982 Long term (current) use of aspirin: Secondary | ICD-10-CM | POA: Insufficient documentation

## 2014-09-16 DIAGNOSIS — E785 Hyperlipidemia, unspecified: Secondary | ICD-10-CM | POA: Diagnosis not present

## 2014-09-16 DIAGNOSIS — K8020 Calculus of gallbladder without cholecystitis without obstruction: Secondary | ICD-10-CM | POA: Diagnosis not present

## 2014-09-16 DIAGNOSIS — K219 Gastro-esophageal reflux disease without esophagitis: Secondary | ICD-10-CM | POA: Diagnosis not present

## 2014-09-16 HISTORY — DX: Personal history of urinary calculi: Z87.442

## 2014-09-16 HISTORY — DX: Other skin changes: R23.8

## 2014-09-16 HISTORY — DX: Chronic obstructive pulmonary disease, unspecified: J44.9

## 2014-09-16 HISTORY — DX: Claustrophobia: F40.240

## 2014-09-16 HISTORY — DX: Spontaneous ecchymoses: R23.3

## 2014-09-16 HISTORY — DX: Diarrhea, unspecified: R19.7

## 2014-09-16 LAB — COMPREHENSIVE METABOLIC PANEL
ALK PHOS: 87 U/L (ref 38–126)
ALT: 17 U/L (ref 17–63)
ANION GAP: 12 (ref 5–15)
AST: 25 U/L (ref 15–41)
Albumin: 4.2 g/dL (ref 3.5–5.0)
BUN: 30 mg/dL — ABNORMAL HIGH (ref 6–20)
CHLORIDE: 102 mmol/L (ref 101–111)
CO2: 22 mmol/L (ref 22–32)
Calcium: 9.6 mg/dL (ref 8.9–10.3)
Creatinine, Ser: 1.96 mg/dL — ABNORMAL HIGH (ref 0.61–1.24)
GFR calc Af Amer: 36 mL/min — ABNORMAL LOW (ref >60)
GFR calc non Af Amer: 31 mL/min — ABNORMAL LOW (ref >60)
Glucose, Bld: 165 mg/dL — ABNORMAL HIGH (ref 65–99)
POTASSIUM: 4.7 mmol/L (ref 3.5–5.1)
SODIUM: 136 mmol/L (ref 135–145)
Total Bilirubin: 1 mg/dL (ref 0.3–1.2)
Total Protein: 7.7 g/dL (ref 6.5–8.1)

## 2014-09-16 LAB — CBC WITH DIFFERENTIAL/PLATELET
BASOS PCT: 1 % (ref 0–1)
Basophils Absolute: 0.1 10*3/uL (ref 0.0–0.1)
EOS PCT: 4 % (ref 0–5)
Eosinophils Absolute: 0.3 10*3/uL (ref 0.0–0.7)
HEMATOCRIT: 36.8 % — AB (ref 39.0–52.0)
Hemoglobin: 12.9 g/dL — ABNORMAL LOW (ref 13.0–17.0)
LYMPHS ABS: 1.7 10*3/uL (ref 0.7–4.0)
Lymphocytes Relative: 27 % (ref 12–46)
MCH: 29.4 pg (ref 26.0–34.0)
MCHC: 35.1 g/dL (ref 30.0–36.0)
MCV: 83.8 fL (ref 78.0–100.0)
Monocytes Absolute: 0.5 10*3/uL (ref 0.1–1.0)
Monocytes Relative: 7 % (ref 3–12)
Neutro Abs: 3.9 10*3/uL (ref 1.7–7.7)
Neutrophils Relative %: 61 % (ref 43–77)
PLATELETS: 300 10*3/uL (ref 150–400)
RBC: 4.39 MIL/uL (ref 4.22–5.81)
RDW: 13.7 % (ref 11.5–15.5)
WBC: 6.5 10*3/uL (ref 4.0–10.5)

## 2014-09-16 LAB — GLUCOSE, CAPILLARY: Glucose-Capillary: 166 mg/dL — ABNORMAL HIGH (ref 65–99)

## 2014-09-16 NOTE — Progress Notes (Signed)
   09/16/14 1014  [place]  Have you ever been diagnosed with sleep apnea through a sleep study? No  Do you snore loudly (loud enough to be heard through closed doors)?  1  Do you often feel tired, fatigued, or sleepy during the daytime? 0  Has anyone observed you stop breathing during your sleep? 0  Do you have, or are you being treated for high blood pressure? 1  BMI more than 35 kg/m2? 0  Age over 78 years old? 1  Neck circumference greater than 40 cm/16 inches? 1  Gender: 1  Obstructive Sleep Apnea Score 5   This patient has screened at risk for sleep apnea using the STOP Bang tool used during a pre-surgical visit. A score of 4 or greater is at risk for sleep apnea.

## 2014-09-16 NOTE — Progress Notes (Signed)
PCP is Lona Kettle. Cardiologist is Daneen Schick. Pulmonologist is Dr. Elsworth Soho. Patient denied having any acute cardiac or pulmonary issues. Patient informed Nurse that he had a cardiac cath times two, and has two stents placed in his heart.   Nurse inquired about fasting blood glucose and patient stated his blood glucose this morning was 199. Blood glucose was 166 upon arrival to PAT. Patient stated the highest his blood glucose has been was 210 and the lowest was 75. Patient informed Nurse that he had not consumed any food this morning. Patient stated his last A1C was about three months ago and it was"good" but patient was unaware of the result. Will obtain A1C today.    Patient informed Nurse that he was instructed to stop his Plavix seven days before surgery, and that he has already stopped it.   Patients wife at chair side during PAT visit.

## 2014-09-17 ENCOUNTER — Encounter (HOSPITAL_COMMUNITY): Payer: Self-pay

## 2014-09-17 LAB — HEMOGLOBIN A1C
Hgb A1c MFr Bld: 7 % — ABNORMAL HIGH (ref 4.8–5.6)
Mean Plasma Glucose: 154 mg/dL

## 2014-09-17 NOTE — Progress Notes (Addendum)
Anesthesia Chart Review:  Pt is 80 year old male scheduled for laparoscopic cholecystectomy with intraoperative cholangiogram on 09/23/2014 with Dr. Marlou Starks.   PCP is A. Lona Kettle. Cardiologist is Dr. Daneen Schick, last office visit 06/01/14. Pulmonologist is Dr. Elsworth Soho, last office visit 10/03/12.     PMH includes: CAD (stenting to CX x3, last was 2014), HTN, DM, hyperlipidemia, COPD, GERD. Former smoker. BMI 33. S/p R knee arthroscopy, partial menisectomy, chondroplasty 08/19/13.   Medications include: ASA, plavix, glyburide, imdur, lisinopril, metformin, pravachol. Pt to stop plavix 5-7 days prior to surgery; pt reported at PAT he will stop 7 days pre-op.   Preoperative labs reviewed.  Cr 1.96, BUN 30. Records from PCP's office show most recent Cr was 1.64 02/04/14.   Chest x-ray 07/24/2014 reviewed. Mild hyperinflation and coarse interstitial markings, may reflect bronchitis. No evidence of pneumonia.  EKG 07/23/2014: Sinus rhythm with marked sinus arrhythmia. Cannot rule out Anterior infarct, age undetermined  Cardiac cath 09/12/2012: 1. Class IV angina due to high-grade obstruction in the proximal circumflex. The circumflex lesion is complex because there is also moderately severe ostial disease with calcification as the vessel arises at a right angle from the left main LAD transition. The distal stent is patent with 50% stenosis at the distal margin. (-09/17/12 had DES to proximal CX) 2. Moderate distal RCA disease, 50-70% distal before the origin of the PDA. 3. Irregularities within the LAD but no high-grade obstruction. 4. No significant left main disease but eccentric calcified plaque noted at the bifurcation with the circumflex. 5. Hyperdynamic left ventricular function 6. Stage [name] chronic renal insufficiency with creatinine of 1.54  Pt has cardiac clearance from Dr. Tamala Julian. Can be found under media tab.   Have faxed Dr. Harrington Challenger a copy of pt's lab results and asked if pt ok to proceed with  surgery given elevated cr and no history of renal insufficiency.   Willeen Cass, FNP-BC [facility] Stay [facility]/Anesthesiology Phone: [phone] 09/18/2014 2:47 PM  Addendum: I received a call from Dr. Harrington Challenger' nurse Alyse Low.  She reports that he reviewed labs faxed by Jonah Blue, FNP-BC.  He feels patient can still proceed with recommendations to "monitor hydration status and recheck labs as needed." Last three Cr 1.96 09/16/14, 1.90 07/24/14, and 1.39 on 07/11/14. Previous baseline appears to be ~ 1.6 - 1.65 since at least 2014.  Since there has not been a significant change since 07/24/14 and his PCP is aware of labs results for future follow-up, I am not planning to recheck labs pre-operatively. If his anesthesiologist disagrees then he/she can get an ISTAT8 on the day of surgery if desired.     [facility] Short Stay Center/Anesthesiology Phone [phone] 09/21/2014 9:24 AM

## 2014-09-23 ENCOUNTER — Encounter (HOSPITAL_COMMUNITY)
Admission: RE | Disposition: A | Discharge status: Home / Self Care | Payer: Self-pay | Source: Ambulatory Visit | Attending: General Surgery

## 2014-09-23 ENCOUNTER — Ambulatory Visit (HOSPITAL_COMMUNITY): Payer: Medicare Other | Admitting: Emergency Medicine

## 2014-09-23 ENCOUNTER — Ambulatory Visit (HOSPITAL_COMMUNITY): Payer: Medicare Other

## 2014-09-23 ENCOUNTER — Ambulatory Visit (HOSPITAL_COMMUNITY)
Admission: RE | Admit: 2014-09-23 | Discharge: 2014-09-24 | Disposition: A | Discharge status: Home / Self Care | Payer: Medicare Other | Source: Ambulatory Visit | Attending: General Surgery | Admitting: General Surgery

## 2014-09-23 ENCOUNTER — Ambulatory Visit (HOSPITAL_COMMUNITY): Payer: Medicare Other | Admitting: Anesthesiology

## 2014-09-23 ENCOUNTER — Encounter (HOSPITAL_COMMUNITY): Payer: Self-pay | Admitting: Anesthesiology

## 2014-09-23 DIAGNOSIS — R0602 Shortness of breath: Secondary | ICD-10-CM | POA: Diagnosis not present

## 2014-09-23 DIAGNOSIS — R05 Cough: Secondary | ICD-10-CM | POA: Diagnosis not present

## 2014-09-23 DIAGNOSIS — Z7982 Long term (current) use of aspirin: Secondary | ICD-10-CM | POA: Insufficient documentation

## 2014-09-23 DIAGNOSIS — K8080 Other cholelithiasis without obstruction: Secondary | ICD-10-CM | POA: Diagnosis present

## 2014-09-23 DIAGNOSIS — K8020 Calculus of gallbladder without cholecystitis without obstruction: Secondary | ICD-10-CM | POA: Diagnosis not present

## 2014-09-23 DIAGNOSIS — N289 Disorder of kidney and ureter, unspecified: Secondary | ICD-10-CM | POA: Insufficient documentation

## 2014-09-23 DIAGNOSIS — K219 Gastro-esophageal reflux disease without esophagitis: Secondary | ICD-10-CM | POA: Diagnosis not present

## 2014-09-23 DIAGNOSIS — J449 Chronic obstructive pulmonary disease, unspecified: Secondary | ICD-10-CM | POA: Insufficient documentation

## 2014-09-23 DIAGNOSIS — R059 Cough, unspecified: Secondary | ICD-10-CM

## 2014-09-23 DIAGNOSIS — I2510 Atherosclerotic heart disease of native coronary artery without angina pectoris: Secondary | ICD-10-CM | POA: Diagnosis not present

## 2014-09-23 DIAGNOSIS — I10 Essential (primary) hypertension: Secondary | ICD-10-CM | POA: Insufficient documentation

## 2014-09-23 DIAGNOSIS — Z9889 Other specified postprocedural states: Secondary | ICD-10-CM

## 2014-09-23 DIAGNOSIS — E119 Type 2 diabetes mellitus without complications: Secondary | ICD-10-CM | POA: Insufficient documentation

## 2014-09-23 DIAGNOSIS — F419 Anxiety disorder, unspecified: Secondary | ICD-10-CM | POA: Insufficient documentation

## 2014-09-23 DIAGNOSIS — K829 Disease of gallbladder, unspecified: Secondary | ICD-10-CM

## 2014-09-23 DIAGNOSIS — K8010 Calculus of gallbladder with chronic cholecystitis without obstruction: Secondary | ICD-10-CM | POA: Diagnosis not present

## 2014-09-23 DIAGNOSIS — Z87891 Personal history of nicotine dependence: Secondary | ICD-10-CM | POA: Diagnosis not present

## 2014-09-23 HISTORY — PX: CHOLECYSTECTOMY: SHX55

## 2014-09-23 LAB — GLUCOSE, CAPILLARY
GLUCOSE-CAPILLARY: 156 mg/dL — AB (ref 65–99)
Glucose-Capillary: 180 mg/dL — ABNORMAL HIGH (ref 65–99)

## 2014-09-23 SURGERY — LAPAROSCOPIC CHOLECYSTECTOMY WITH INTRAOPERATIVE CHOLANGIOGRAM
Anesthesia: General | Site: Abdomen

## 2014-09-23 MED ORDER — SODIUM CHLORIDE 0.9 % IJ SOLN
INTRAMUSCULAR | Status: AC
  Filled 2014-09-23: qty 10

## 2014-09-23 MED ORDER — LISINOPRIL 10 MG PO TABS: 10.0000 mg | ORAL_TABLET | Freq: Every day | ORAL | Status: DC

## 2014-09-23 MED ORDER — DEXAMETHASONE SODIUM PHOSPHATE 4 MG/ML IJ SOLN
INTRAMUSCULAR | Status: AC
  Filled 2014-09-23: qty 1

## 2014-09-23 MED ORDER — ROCURONIUM BROMIDE 100 MG/10ML IV SOLN
INTRAVENOUS | Status: DC | PRN
  Administered 2014-09-23: 40 mg via INTRAVENOUS

## 2014-09-23 MED ORDER — OXYCODONE-ACETAMINOPHEN 5-325 MG PO TABS
1.0000 | ORAL_TABLET | ORAL | Status: DC | PRN
  Administered 2014-09-23: 2 via ORAL
  Administered 2014-09-23: 1 via ORAL
  Administered 2014-09-24: 2 via ORAL
  Filled 2014-09-23: qty 1
  Filled 2014-09-23 (×2): qty 2

## 2014-09-23 MED ORDER — PANTOPRAZOLE SODIUM 40 MG PO TBEC: 40.0000 mg | DELAYED_RELEASE_TABLET | Freq: Every day | ORAL | Status: DC

## 2014-09-23 MED ORDER — GLYCOPYRROLATE 0.2 MG/ML IJ SOLN
INTRAMUSCULAR | Status: DC | PRN
  Administered 2014-09-23: 0.6 mg via INTRAVENOUS

## 2014-09-23 MED ORDER — ASPIRIN EC 81 MG PO TBEC: 81.0000 mg | DELAYED_RELEASE_TABLET | Freq: Every day | ORAL | Status: DC

## 2014-09-23 MED ORDER — NITROGLYCERIN 0.4 MG SL SUBL: 0.4000 mg | SUBLINGUAL_TABLET | SUBLINGUAL | Status: DC | PRN

## 2014-09-23 MED ORDER — ALBUTEROL SULFATE (2.5 MG/3ML) 0.083% IN NEBU: 2.0000 mL | INHALATION_SOLUTION | Freq: Four times a day (QID) | RESPIRATORY_TRACT | Status: DC | PRN

## 2014-09-23 MED ORDER — SUCCINYLCHOLINE CHLORIDE 20 MG/ML IJ SOLN
INTRAMUSCULAR | Status: AC
Start: 2014-09-23 — End: 2014-09-23
  Filled 2014-09-23: qty 1

## 2014-09-23 MED ORDER — PROPOFOL 10 MG/ML IV BOLUS
INTRAVENOUS | Status: DC | PRN
  Administered 2014-09-23: 110 mg via INTRAVENOUS

## 2014-09-23 MED ORDER — 0.9 % SODIUM CHLORIDE (POUR BTL) OPTIME
TOPICAL | Status: DC | PRN
  Administered 2014-09-23: 1000 mL

## 2014-09-23 MED ORDER — PRAVASTATIN SODIUM 10 MG PO TABS: 40.0000 mg | ORAL_TABLET | Freq: Every day | ORAL | Status: DC

## 2014-09-23 MED ORDER — METFORMIN HCL 500 MG PO TABS: 500.0000 mg | ORAL_TABLET | Freq: Two times a day (BID) | ORAL | Status: DC

## 2014-09-23 MED ORDER — ONDANSETRON HCL 4 MG PO TABS: 4.0000 mg | ORAL_TABLET | Freq: Four times a day (QID) | ORAL | Status: DC | PRN

## 2014-09-23 MED ORDER — ONDANSETRON HCL 4 MG/2ML IJ SOLN: 4.0000 mg | Freq: Four times a day (QID) | INTRAMUSCULAR | Status: DC | PRN

## 2014-09-23 MED ORDER — ARTIFICIAL TEARS OP OINT
TOPICAL_OINTMENT | OPHTHALMIC | Status: AC
  Filled 2014-09-23: qty 3.5

## 2014-09-23 MED ORDER — ALBUTEROL SULFATE (2.5 MG/3ML) 0.083% IN NEBU
2.5000 mg | INHALATION_SOLUTION | Freq: Once | RESPIRATORY_TRACT | Status: AC
  Administered 2014-09-23: 2.5 mg via RESPIRATORY_TRACT

## 2014-09-23 MED ORDER — GLYBURIDE 5 MG PO TABS
5.0000 mg | ORAL_TABLET | Freq: Every day | ORAL | Status: DC
  Filled 2014-09-23 (×2): qty 1

## 2014-09-23 MED ORDER — MORPHINE SULFATE 2 MG/ML IJ SOLN: 1.0000 mg | INTRAMUSCULAR | Status: DC | PRN

## 2014-09-23 MED ORDER — TIOTROPIUM BROMIDE MONOHYDRATE 18 MCG IN CAPS
36.0000 ug | ORAL_CAPSULE | RESPIRATORY_TRACT | Status: DC
  Filled 2014-09-23: qty 5

## 2014-09-23 MED ORDER — ALBUTEROL SULFATE (2.5 MG/3ML) 0.083% IN NEBU
INHALATION_SOLUTION | RESPIRATORY_TRACT | Status: AC
  Filled 2014-09-23: qty 3

## 2014-09-23 MED ORDER — HEPARIN SODIUM (PORCINE) 5000 UNIT/ML IJ SOLN
5000.0000 [IU] | Freq: Three times a day (TID) | INTRAMUSCULAR | Status: DC
  Administered 2014-09-24: 5000 [IU] via SUBCUTANEOUS
  Filled 2014-09-23: qty 1

## 2014-09-23 MED ORDER — MIDAZOLAM HCL 2 MG/2ML IJ SOLN
INTRAMUSCULAR | Status: AC
  Filled 2014-09-23: qty 2

## 2014-09-23 MED ORDER — ACETYLCYSTEINE 10 % IN SOLN
4.0000 mL | Freq: Once | RESPIRATORY_TRACT | Status: AC
  Administered 2014-09-23: 4 mL via RESPIRATORY_TRACT
  Filled 2014-09-23: qty 4

## 2014-09-23 MED ORDER — GUAIFENESIN ER 600 MG PO TB12: 600.0000 mg | ORAL_TABLET | Freq: Every day | ORAL | Status: DC | PRN

## 2014-09-23 MED ORDER — ALBUTEROL SULFATE (2.5 MG/3ML) 0.083% IN NEBU: 2.5000 mg | INHALATION_SOLUTION | Freq: Four times a day (QID) | RESPIRATORY_TRACT | Status: DC | PRN

## 2014-09-23 MED ORDER — PRIMIDONE 50 MG PO TABS
50.0000 mg | ORAL_TABLET | Freq: Every day | ORAL | Status: DC
  Administered 2014-09-23: 50 mg via ORAL
  Filled 2014-09-23 (×3): qty 1

## 2014-09-23 MED ORDER — ALLOPURINOL 100 MG PO TABS
100.0000 mg | ORAL_TABLET | Freq: Every day | ORAL | Status: DC
Start: 2014-09-24 — End: 2014-09-24

## 2014-09-23 MED ORDER — GUAIFENESIN 400 MG PO TABS: 800.0000 mg | ORAL_TABLET | Freq: Every day | ORAL | Status: DC | PRN

## 2014-09-23 MED ORDER — FENTANYL CITRATE (PF) 250 MCG/5ML IJ SOLN
INTRAMUSCULAR | Status: AC
  Filled 2014-09-23: qty 5

## 2014-09-23 MED ORDER — CLOPIDOGREL BISULFATE 75 MG PO TABS: 75.0000 mg | ORAL_TABLET | Freq: Every day | ORAL | Status: DC

## 2014-09-23 MED ORDER — GUAIFENESIN ER 600 MG PO TB12
600.0000 mg | ORAL_TABLET | Freq: Once | ORAL | Status: AC
  Administered 2014-09-23: 600 mg via ORAL
  Filled 2014-09-23: qty 1

## 2014-09-23 MED ORDER — ROCURONIUM BROMIDE 50 MG/5ML IV SOLN
INTRAVENOUS | Status: AC
  Filled 2014-09-23: qty 1

## 2014-09-23 MED ORDER — HYDROMORPHONE HCL 1 MG/ML IJ SOLN
0.2500 mg | INTRAMUSCULAR | Status: DC | PRN
  Administered 2014-09-23 (×4): 0.5 mg via INTRAVENOUS

## 2014-09-23 MED ORDER — NEOSTIGMINE METHYLSULFATE 10 MG/10ML IV SOLN
INTRAVENOUS | Status: DC | PRN
  Administered 2014-09-23: 4 mg via INTRAVENOUS

## 2014-09-23 MED ORDER — ARTIFICIAL TEARS OP OINT
TOPICAL_OINTMENT | OPHTHALMIC | Status: DC | PRN
Start: 2014-09-23 — End: 2014-09-23
  Administered 2014-09-23: 1 via OPHTHALMIC

## 2014-09-23 MED ORDER — HYDROMORPHONE HCL 1 MG/ML IJ SOLN
INTRAMUSCULAR | Status: AC
  Administered 2014-09-23: 0.5 mg via INTRAVENOUS
  Filled 2014-09-23: qty 1

## 2014-09-23 MED ORDER — CHLORHEXIDINE GLUCONATE 4 % EX LIQD: 1.0000 "application " | Freq: Once | CUTANEOUS | Status: DC

## 2014-09-23 MED ORDER — HYDROMORPHONE HCL 1 MG/ML IJ SOLN
INTRAMUSCULAR | Status: AC
  Filled 2014-09-23: qty 1

## 2014-09-23 MED ORDER — VITAMIN B-12 1000 MCG PO TABS: 1000.0000 ug | ORAL_TABLET | Freq: Every day | ORAL | Status: DC

## 2014-09-23 MED ORDER — GLYCOPYRROLATE 0.2 MG/ML IJ SOLN
INTRAMUSCULAR | Status: AC
  Filled 2014-09-23: qty 3

## 2014-09-23 MED ORDER — DEXAMETHASONE SODIUM PHOSPHATE 4 MG/ML IJ SOLN
INTRAMUSCULAR | Status: DC | PRN
  Administered 2014-09-23: 4 mg via INTRAVENOUS

## 2014-09-23 MED ORDER — LACTATED RINGERS IV SOLN
INTRAVENOUS | Status: DC | PRN
  Administered 2014-09-23: 08:00:00 via INTRAVENOUS

## 2014-09-23 MED ORDER — FINASTERIDE 5 MG PO TABS: 5.0000 mg | ORAL_TABLET | Freq: Every day | ORAL | Status: DC

## 2014-09-23 MED ORDER — DEXAMETHASONE SODIUM PHOSPHATE 10 MG/ML IJ SOLN
INTRAMUSCULAR | Status: AC
  Filled 2014-09-23: qty 1

## 2014-09-23 MED ORDER — OXYCODONE-ACETAMINOPHEN 5-325 MG PO TABS: 1.0000 | ORAL_TABLET | ORAL | Status: DC | PRN

## 2014-09-23 MED ORDER — CEFAZOLIN SODIUM-DEXTROSE 2-3 GM-% IV SOLR
2.0000 g | INTRAVENOUS | Status: AC
  Administered 2014-09-23: 2 g via INTRAVENOUS
  Filled 2014-09-23: qty 50

## 2014-09-23 MED ORDER — SODIUM CHLORIDE 0.9 % IR SOLN
Status: DC | PRN
  Administered 2014-09-23: 1000 mL

## 2014-09-23 MED ORDER — EPHEDRINE SULFATE 50 MG/ML IJ SOLN
INTRAMUSCULAR | Status: AC
  Filled 2014-09-23: qty 1

## 2014-09-23 MED ORDER — ALBUTEROL SULFATE HFA 108 (90 BASE) MCG/ACT IN AERS
INHALATION_SPRAY | RESPIRATORY_TRACT | Status: AC
  Filled 2014-09-23: qty 6.7

## 2014-09-23 MED ORDER — LIDOCAINE HCL 2 % EX GEL
1.0000 "application " | Freq: Once | CUTANEOUS | Status: DC
  Filled 2014-09-23: qty 5

## 2014-09-23 MED ORDER — PROPOFOL 10 MG/ML IV BOLUS
INTRAVENOUS | Status: AC
  Filled 2014-09-23: qty 20

## 2014-09-23 MED ORDER — ISOSORBIDE MONONITRATE ER 60 MG PO TB24: 60.0000 mg | ORAL_TABLET | Freq: Every day | ORAL | Status: DC

## 2014-09-23 MED ORDER — KCL IN DEXTROSE-NACL 20-5-0.9 MEQ/L-%-% IV SOLN
INTRAVENOUS | Status: DC
  Administered 2014-09-23: 16:00:00 via INTRAVENOUS
  Filled 2014-09-23 (×2): qty 1000

## 2014-09-23 MED ORDER — LIDOCAINE HCL (CARDIAC) 20 MG/ML IV SOLN
INTRAVENOUS | Status: DC | PRN
  Administered 2014-09-23: 40 mg via INTRAVENOUS

## 2014-09-23 MED ORDER — SODIUM CHLORIDE 0.9 % IV SOLN
INTRAVENOUS | Status: DC | PRN
  Administered 2014-09-23: 9 mL

## 2014-09-23 MED ORDER — GEMFIBROZIL 600 MG PO TABS
600.0000 mg | ORAL_TABLET | Freq: Every day | ORAL | Status: DC
  Filled 2014-09-23 (×2): qty 1

## 2014-09-23 MED ORDER — FENTANYL CITRATE (PF) 100 MCG/2ML IJ SOLN
INTRAMUSCULAR | Status: DC | PRN
  Administered 2014-09-23 (×5): 50 ug via INTRAVENOUS

## 2014-09-23 MED ORDER — BUPIVACAINE-EPINEPHRINE 0.25% -1:200000 IJ SOLN
INTRAMUSCULAR | Status: DC | PRN
  Administered 2014-09-23: 18 mL

## 2014-09-23 MED ORDER — ONDANSETRON HCL 4 MG/2ML IJ SOLN
INTRAMUSCULAR | Status: DC | PRN
  Administered 2014-09-23: 4 mg via INTRAVENOUS

## 2014-09-23 MED ORDER — BUPIVACAINE-EPINEPHRINE (PF) 0.25% -1:200000 IJ SOLN
INTRAMUSCULAR | Status: AC
  Filled 2014-09-23: qty 30

## 2014-09-23 SURGICAL SUPPLY — 42 items
APPLIER CLIP 5 13 M/L LIGAMAX5 (MISCELLANEOUS) ×3
APR CLP MED LRG 5 ANG JAW (MISCELLANEOUS) ×1
BAG SPEC RTRVL LRG 6X4 10 (ENDOMECHANICALS) ×1
BLADE SURG ROTATE 9660 (MISCELLANEOUS) ×3 IMPLANT
CANISTER SUCTION 2500CC (MISCELLANEOUS) ×3 IMPLANT
CATH REDDICK CHOLANGI 4FR 50CM (CATHETERS) ×3 IMPLANT
CHLORAPREP W/TINT 26ML (MISCELLANEOUS) ×3 IMPLANT
CLIP APPLIE 5 13 M/L LIGAMAX5 (MISCELLANEOUS) ×1 IMPLANT
CONT SPEC 4OZ CLIKSEAL STRL BL (MISCELLANEOUS) ×2 IMPLANT
COVER MAYO STAND STRL (DRAPES) ×3 IMPLANT
COVER SURGICAL LIGHT HANDLE (MISCELLANEOUS) ×3 IMPLANT
DRAPE C-ARM 42X72 X-RAY (DRAPES) ×3 IMPLANT
DRAPE PROXIMA HALF (DRAPES) ×2 IMPLANT
ELECT REM PT RETURN 9FT ADLT (ELECTROSURGICAL) ×3
ELECTRODE REM PT RTRN 9FT ADLT (ELECTROSURGICAL) ×1 IMPLANT
GLOVE BIO SURGEON STRL SZ 6.5 (GLOVE) ×1 IMPLANT
GLOVE BIO SURGEON STRL SZ7.5 (GLOVE) ×5 IMPLANT
GLOVE BIO SURGEONS STRL SZ 6.5 (GLOVE) ×1
GLOVE BIOGEL PI IND STRL 7.0 (GLOVE) IMPLANT
GLOVE BIOGEL PI IND STRL 7.5 (GLOVE) IMPLANT
GLOVE BIOGEL PI INDICATOR 7.0 (GLOVE) ×4
GLOVE BIOGEL PI INDICATOR 7.5 (GLOVE) ×2
GOWN STRL REUS W/ TWL LRG LVL3 (GOWN DISPOSABLE) ×3 IMPLANT
GOWN STRL REUS W/TWL LRG LVL3 (GOWN DISPOSABLE) ×12
IV CATH 14GX2 1/4 (CATHETERS) ×3 IMPLANT
KIT BASIN OR (CUSTOM PROCEDURE TRAY) ×3 IMPLANT
KIT ROOM TURNOVER OR (KITS) ×3 IMPLANT
LIQUID BAND (GAUZE/BANDAGES/DRESSINGS) ×3 IMPLANT
NS IRRIG 1000ML POUR BTL (IV SOLUTION) ×3 IMPLANT
PAD ARMBOARD 7.5X6 YLW CONV (MISCELLANEOUS) ×3 IMPLANT
POUCH SPECIMEN RETRIEVAL 10MM (ENDOMECHANICALS) ×3 IMPLANT
SCISSORS LAP 5X35 DISP (ENDOMECHANICALS) ×3 IMPLANT
SET IRRIG TUBING LAPAROSCOPIC (IRRIGATION / IRRIGATOR) ×3 IMPLANT
SLEEVE ENDOPATH XCEL 5M (ENDOMECHANICALS) ×6 IMPLANT
SPECIMEN JAR SMALL (MISCELLANEOUS) ×1 IMPLANT
SUT MNCRL AB 4-0 PS2 18 (SUTURE) ×3 IMPLANT
TOWEL OR 17X24 6PK STRL BLUE (TOWEL DISPOSABLE) ×3 IMPLANT
TOWEL OR 17X26 10 PK STRL BLUE (TOWEL DISPOSABLE) ×1 IMPLANT
TRAY LAPAROSCOPIC (CUSTOM PROCEDURE TRAY) ×3 IMPLANT
TROCAR XCEL BLUNT TIP 100MML (ENDOMECHANICALS) ×3 IMPLANT
TROCAR XCEL NON-BLD 5MMX100MML (ENDOMECHANICALS) ×3 IMPLANT
TUBING INSUFFLATION (TUBING) ×3 IMPLANT

## 2014-09-23 NOTE — H&P (Signed)
Marco Acevedo 08/20/2014 11:14 AM Location: [facility] Patient #: [id number] DOB: May 31, 1934 Married / Language: Marco Acevedo / Race: White Male  History of Present Illness Sammuel Hines. Marlou Starks MD; 08/20/2014 11:54 AM) Patient words: abd pain.  The patient is a 80 year old male who presents for a follow-up for Abdominal pain. The patient is a 80 year old white male who has known gallstones. The last time we saw him he was having no abdominal symptoms whatsoever. Since his last visit he has been experiencing epigastric and substernal pressure and discomfort. He has seen his cardiologist who has ruled out a cardiac source. He also notes that since his last visit he lost his son suddenly and he has been experiencing some grief. He denies any nausea or vomiting.   Allergies Elbert Ewings, CMA; 08/20/2014 11:14 AM) No Known Drug Allergies11/19/2015  Medication History Elbert Ewings, CMA; 08/20/2014 11:14 AM) Tiotropium Bromide Monohydrate (18MCG Capsule, Inhalation) Active. GuaiFENesin (400MG  Tablet, Oral) Active. Omeprazole (20MG  Capsule DR, Oral) Active. Clopidogrel Bisulfate (75MG  Tablet, Oral) Active. Primidone (50MG  Tablet, Oral) Active. Nitroglycerin (0.4MG  Tab Sublingual, Sublingual) Active. Finasteride (5MG  Tablet, Oral) Active. Fluticasone Propionate (50MCG/ACT Suspension, Nasal) Active. Pravastatin Sodium (40MG  Tablet, Oral) Active. Allopurinol (300MG  Tablet, Oral) Active. Lisinopril (10MG  Tablet, Oral) Active. MetFORMIN HCl (1000MG  Tablet, Oral) Active. Iron (65MG  Tablet, Oral) Active. B12-Active (1MG  Tablet Chewable, Oral) Active. Baby Aspirin (81MG  Tablet Chewable, Oral) Active.  Review of Systems Eddie Dibbles S. Marlou Starks MD; 08/20/2014 11:54 AM) General Present- Appetite Loss and Fatigue. Not Present- Chills, Fever, Night Sweats, Weight Gain and Weight Loss. Skin Not Present- Change in Wart/Mole, Dryness, Hives, Jaundice, New Lesions, Non-Healing Wounds, Rash and  Ulcer. HEENT Present- Hearing Loss, Ringing in the Ears and Wears glasses/contact lenses. Not Present- Earache, Hoarseness, Nose Bleed, Oral Ulcers, Seasonal Allergies, Sinus Pain, Sore Throat, Visual Disturbances and Yellow Eyes. Respiratory Present- Difficulty Breathing and Snoring. Not Present- Bloody sputum, Chronic Cough and Wheezing. Breast Not Present- Breast Mass, Breast Pain, Nipple Discharge and Skin Changes. Cardiovascular Present- Shortness of Breath. Not Present- Chest Pain, Difficulty Breathing Lying Down, Leg Cramps, Palpitations, Rapid Heart Rate and Swelling of Extremities. Gastrointestinal Present- Abdominal Pain, Bloating, Chronic diarrhea, Gets full quickly at meals and Hemorrhoids. Not Present- Bloody Stool, Change in Bowel Habits, Constipation, Difficulty Swallowing, Excessive gas, Indigestion, Nausea, Rectal Pain and Vomiting. Male Genitourinary Present- Impotence and Nocturia. Not Present- Blood in Urine, Change in Urinary Stream, Frequency, Painful Urination, Urgency and Urine Leakage. Musculoskeletal Present- Back Pain, Muscle Pain and Muscle Weakness. Not Present- Joint Pain, Joint Stiffness and Swelling of Extremities. Neurological Present- Tremor. Not Present- Decreased Memory, Fainting, Headaches, Numbness, Seizures, Tingling, Trouble walking and Weakness. Psychiatric Not Present- Anxiety, Bipolar, Change in Sleep Pattern, Depression, Fearful and Frequent crying. Hematology Present- Easy Bruising and Excessive bleeding. Not Present- Gland problems, HIV and Persistent Infections.   Vitals Elbert Ewings CMA; 08/20/2014 11:15 AM) 08/20/2014 11:15 AM Weight: 217 lb Height: 68in Body Surface Area: 2.17 m Body Mass Index: 32.99 kg/m Temp.: 98.36F(Oral)  Pulse: 87 (Regular)  Resp.: 17 (Unlabored)  BP: 142/78 (Sitting, Left Arm, Standard)    Physical Exam Eddie Dibbles S. Marlou Starks MD; 08/20/2014 11:55 AM) General Mental Status-Alert. General Appearance-Consistent  with stated age. Hydration-Well hydrated. Voice-Normal.  Head and Neck Head-normocephalic, atraumatic with no lesions or palpable masses. Trachea-midline. Thyroid Gland Characteristics - normal size and consistency.  Eye Eyeball - Bilateral-Extraocular movements intact. Sclera/Conjunctiva - Bilateral-No scleral icterus.  Chest and Lung Exam Chest and lung exam reveals -quiet, even and easy respiratory  effort with no use of accessory muscles and on auscultation, normal breath sounds, no adventitious sounds and normal vocal resonance. Inspection Chest Wall - Normal. Back - normal.  Cardiovascular Cardiovascular examination reveals -normal heart sounds, regular rate and rhythm with no murmurs and normal pedal pulses bilaterally.  Abdomen Inspection Inspection of the abdomen reveals - No Hernias. Skin - Scar - no surgical scars. Palpation/Percussion Palpation and Percussion of the abdomen reveal - Soft, Non Tender, No Rebound tenderness, No Rigidity (guarding) and No hepatosplenomegaly. Auscultation Auscultation of the abdomen reveals - Bowel sounds normal.  Neurologic Neurologic evaluation reveals -alert and oriented x 3 with no impairment of recent or remote memory. Mental Status-Normal.  Musculoskeletal Normal Exam - Left-Upper Extremity Strength Normal and Lower Extremity Strength Normal. Normal Exam - Right-Upper Extremity Strength Normal and Lower Extremity Strength Normal.  Lymphatic Head & Neck  General Head & Neck Lymphatics: Bilateral - Description - Normal. Axillary  General Axillary Region: Bilateral - Description - Normal. Tenderness - Non Tender. Femoral & Inguinal  Generalized Femoral & Inguinal Lymphatics: Bilateral - Description - Normal. Tenderness - Non Tender.    Assessment & Plan Eddie Dibbles S. Toth MD; 08/20/2014 11:53 AM) GALLSTONES (574.20  K80.20) Impression: The patient has known gallstones and is starting to have more  epigastric and substernal pain. His cardiologist has ruled out a cardiac source. It is certainly possible that the gallstones are causing the discomfort he is experiencing. Because of this I think it would be reasonable to plan to remove the gallbladder. I have discussed with him in detail the risks and benefits of the operation to remove the gallbladder as well as some of the technical aspects and he understands. He will call us back when he is ready to schedule. I will go ahead and get cardiac clearance from Dr. Pernell Dupre in preparation for this     Signed by Luella Cook, MD (08/20/2014 11:55 AM)

## 2014-09-23 NOTE — Anesthesia Procedure Notes (Signed)
Procedure Name: Intubation Date/Time: 09/23/2014 8:37 AM Performed by: Susa Loffler Pre-anesthesia Checklist: Patient identified, Timeout performed, Emergency Drugs available, Suction available and Patient being monitored Patient Re-evaluated:Patient Re-evaluated prior to inductionOxygen Delivery Method: Circle system utilized Preoxygenation: Pre-oxygenation with 100% oxygen Intubation Type: IV induction Ventilation: Oral airway inserted - appropriate to patient size and Two handed mask ventilation required Laryngoscope Size: Mac and 4 Grade View: Grade II Tube type: Oral Tube size: 7.5 mm Number of attempts: 1 Airway Equipment and Method: Stylet and Oral airway Placement Confirmation: ETT inserted through vocal cords under direct vision,  positive ETCO2 and breath sounds checked- equal and bilateral Secured at: 23 cm Tube secured with: Tape Dental Injury: Teeth and Oropharynx as per pre-operative assessment

## 2014-09-23 NOTE — Op Note (Signed)
09/23/2014  9:40 AM  PATIENT:  Marco Acevedo  80 y.o. male  PRE-OPERATIVE DIAGNOSIS:  Gallstones  POST-OPERATIVE DIAGNOSIS:  Gallstones  PROCEDURE:  Procedure(s): LAPAROSCOPIC CHOLECYSTECTOMY WITH INTRAOPERATIVE CHOLANGIOGRAM (N/A)  SURGEON:  Surgeon(s) and Role:    * Jovita Kussmaul, MD - Primary  PHYSICIAN ASSISTANT:   ASSISTANTS: Sharyn Dross, RNFA   ANESTHESIA:   general  EBL:  Total I/O In: 800 [I.V.:800] Out: -   BLOOD ADMINISTERED:none  DRAINS: none   LOCAL MEDICATIONS USED:  MARCAINE     SPECIMEN:  Source of Specimen:  gallbladder  DISPOSITION OF SPECIMEN:  PATHOLOGY  COUNTS:  YES  TOURNIQUET:  * No tourniquets in log *  DICTATION: .Dragon Dictation   Procedure: After informed consent was obtained the patient was brought to the operating room and placed in the supine position on the operating room table. After adequate induction of general anesthesia the patient's abdomen was prepped with ChloraPrep allowed to dry and draped in usual sterile manner. The area below the umbilicus was infiltrated with quarter percent  Marcaine. A small incision was made with a 15 blade knife. The incision was carried down through the subcutaneous tissue bluntly with a hemostat and [employer] retractors. The linea alba was identified. The linea alba was incised with a 15 blade knife and each side was grasped with Coker clamps. The preperitoneal space was then probed with a hemostat until the peritoneum was opened and access was gained to the abdominal cavity. A 0 Vicryl pursestring stitch was placed in the fascia surrounding the opening. A Hassan cannula was then placed through the opening and anchored in place with the previously placed Vicryl purse string stitch. The abdomen was insufflated with carbon dioxide without difficulty. A laparoscope was inserted through the [facility] cannula in the right upper quadrant was inspected. Next the epigastric region was infiltrated with % Marcaine. A  small incision was made with a 15 blade knife. A 5 mm port was placed bluntly through this incision into the abdominal cavity under direct vision. Next 2 sites were chosen laterally on the right side of the abdomen for placement of 5 mm ports. Each of these areas was infiltrated with quarter percent Marcaine. Small stab incisions were made with a 15 blade knife. 5 mm ports were then placed bluntly through these incisions into the abdominal cavity under direct vision without difficulty. A blunt grasper was placed through the lateralmost 5 mm port and used to grasp the dome of the gallbladder and elevated anteriorly and superiorly. Another blunt grasper was placed through the other 5 mm port and used to retract the body and neck of the gallbladder. A dissector was placed through the epigastric port and using the electrocautery the peritoneal reflection at the gallbladder neck was opened. Blunt dissection was then carried out in this area until the gallbladder neck-cystic duct junction was readily identified and a good window was created. A single clip was placed on the gallbladder neck. A small  ductotomy was made just below the clip with laparoscopic scissors. A 14-gauge Angiocath was then placed through the anterior abdominal wall under direct vision. A Reddick cholangiogram catheter was then placed through the Angiocath and flushed. The catheter was then placed in the cystic duct and anchored in place with a clip. A cholangiogram was obtained that showed no filling defects good emptying into the duodenum an adequate length on the cystic duct. The anchoring clip and catheters were then removed from the patient. 3 clips were placed  proximally on the cystic duct and the duct was divided between the 2 sets of clips. Posterior to this the cystic artery was identified and again dissected bluntly in a circumferential manner until a good window  was created. 2 clips were placed proximally and one distally on the artery and  the artery was divided between the 2 sets of clips. Next a laparoscopic hook cautery device was used to separate the gallbladder from the liver bed. Prior to completely detaching the gallbladder from the liver bed the liver bed was inspected and several small bleeding points were coagulated with the electrocautery until the area was completely hemostatic. The gallbladder was then detached the rest of it from the liver bed without difficulty. A laparoscopic bag was inserted through the hassan port. The gallbladder was placed within the bag and the bag was sealed. A laparoscope was then moved to the epigastric port. The bag with the gallbladder was then removed with the [facility] cannula through the infraumbilical port without difficulty. The fascial defect was then closed with the previously placed Vicryl pursestring stitch as well as with another figure-of-eight 0 Vicryl stitch. The liver bed was inspected again and found to be hemostatic. The abdomen was irrigated with copious amounts of saline until the effluent was clear. The ports were then removed under direct vision without difficulty and were found to be hemostatic. The gas was allowed to escape. The skin incisions were all closed with interrupted 4-0 Monocryl subcuticular stitches. Dermabond dressings were applied. The patient tolerated the procedure well. At the end of the case all needle sponge and instrument counts were correct. The patient was then awakened and taken to recovery in stable condition  PLAN OF CARE: Discharge to home after PACU  PATIENT DISPOSITION:  PACU - hemodynamically stable.   Delay start of Pharmacological VTE agent (>24hrs) due to surgical blood loss or risk of bleeding: not applicable

## 2014-09-23 NOTE — Progress Notes (Signed)
Dr. Lissa Hoard at bedside to check on patient.

## 2014-09-23 NOTE — Interval H&P Note (Signed)
History and Physical Interval Note:  09/23/2014 8:07 AM  Marco Acevedo  has presented today for surgery, with the diagnosis of Gallstones  The various methods of treatment have been discussed with the patient and family. After consideration of risks, benefits and other options for treatment, the patient has consented to  Procedure(s): LAPAROSCOPIC CHOLECYSTECTOMY WITH INTRAOPERATIVE CHOLANGIOGRAM (N/A) as a surgical intervention .  The patient's history has been reviewed, patient examined, no change in status, stable for surgery.  I have reviewed the patient's chart and labs.  Questions were answered to the patient's satisfaction.     TOTH [name],PAUL S

## 2014-09-23 NOTE — Progress Notes (Signed)
Arrived to room 6n11, a/ox4, oriented to room and surroundings, family at bedside, denies pain/nausea at this time

## 2014-09-23 NOTE — Progress Notes (Signed)
Patient c/o "can't breath", no wheezing noted, breath sounds clear via auscultation. Dr. Deatra Canter at bedside. Order given and noted.

## 2014-09-23 NOTE — Transfer of Care (Signed)
Immediate Anesthesia Transfer of Care Note  Patient: Marco Acevedo  Procedure(s) Performed: Procedure(s): LAPAROSCOPIC CHOLECYSTECTOMY WITH INTRAOPERATIVE CHOLANGIOGRAM (N/A)  Patient Location: PACU  Anesthesia Type:General  Level of Consciousness: awake, alert  and oriented  Airway & Oxygen Therapy: Patient Spontanous Breathing and Patient connected to face mask oxygen  Post-op Assessment: Report given to RN and Post -op Vital signs reviewed and stable  Post vital signs: Reviewed and stable  Last Vitals:  Filed Vitals:   09/23/14 1006  BP:   Pulse:   Temp: 36.4 C  Resp:     Complications: No apparent anesthesia complications

## 2014-09-23 NOTE — Progress Notes (Signed)
PHARMACIST - PHYSICIAN COMMUNICATION DR:  Marlou Starks and colleagues CONCERNING:  METFORMIN SAFE ADMINISTRATION POLICY  RECOMMENDATION: Metformin has been placed on DISCONTINUE (rejected order) STATUS and should be reordered only after any of the conditions below are ruled out.  Current safety recommendations include avoiding metformin for a minimum of 48 hours after the patient's exposure to intravenous contrast media.  DESCRIPTION:  The Pharmacy Committee has adopted a policy that restricts the use of metformin in hospitalized patients until all the contraindications to administration have been ruled out. Specific contraindications are: [x]  Serum creatinine ? 1.5 for males []  Serum creatinine ? 1.4 for females []  Shock, acute MI, sepsis, hypoxemia, dehydration []  Planned administration of intravenous iodinated contrast media []  Heart Failure patients with low EF [] Acute or chronic metabolic acidosis (including DKA)  Sherlon Handing, PharmD, BCPS Clinical pharmacist, pager [phone] 09/23/2014 10:12 PM

## 2014-09-23 NOTE — Progress Notes (Signed)
Patient says "it feels like there is a mucus inside". Continue to cough hard as to try to cough mucus out. Dr. Ermalene Postin notified, in a procedure as of this time. Will comeby to see patient.

## 2014-09-23 NOTE — Anesthesia Preprocedure Evaluation (Addendum)
Anesthesia Evaluation  Patient identified by MRN, date of birth, ID band  Reviewed: Allergy & Precautions, H&P , NPO status , Patient's Chart, lab work & pertinent test results  Airway Mallampati: [name]  TM Distance: >3 FB Neck ROM: Full    Dental no notable dental hx. (+) Edentulous Upper, Dental Advisory Given   Pulmonary shortness of breath and with exertion, COPD COPD inhaler, former smoker,  breath sounds clear to auscultation  Pulmonary exam normal       Cardiovascular hypertension, Pt. on medications + CAD and + Cardiac Stents Rhythm:Regular Rate:Normal     Neuro/Psych Anxiety negative neurological ROS  negative psych ROS   GI/Hepatic Neg liver ROS, GERD-  ,  Endo/Other  diabetes, Type 2, Oral Hypoglycemic Agents  Renal/GU Renal InsufficiencyRenal disease  negative genitourinary   Musculoskeletal   Abdominal   Peds  Hematology negative hematology ROS (+)   Anesthesia Other Findings   Reproductive/Obstetrics negative OB ROS                            Anesthesia Physical Anesthesia Plan  ASA: [name]  Anesthesia Plan: General   Post-op Pain Management:    Induction: Intravenous  Airway Management Planned: Oral ETT  Additional Equipment:   Intra-op Plan:   Post-operative Plan: Extubation in OR  Informed Consent: I have reviewed the patients History and Physical, chart, labs and discussed the procedure including the risks, benefits and alternatives for the proposed anesthesia with the patient or authorized representative who has indicated his/her understanding and acceptance.   Dental advisory given  Plan Discussed with: CRNA  Anesthesia Plan Comments:         Anesthesia Quick Evaluation

## 2014-09-23 NOTE — Progress Notes (Signed)
Pt unable to void. 1 attempt of in and out cath which met resistance. Dr,. Tseui notified.  Coude cath inserted(new order) no resistance met but no urine return(1 small clot was in cath). Upon removing cath pt started voiding in bed. Stood him beside bed and he voided 287m of pink/red urine. Will continue to monitor.

## 2014-09-23 NOTE — Anesthesia Postprocedure Evaluation (Signed)
  Anesthesia Post-op Note  Patient: Marco Acevedo  Procedure(s) Performed: Procedure(s): LAPAROSCOPIC CHOLECYSTECTOMY WITH INTRAOPERATIVE CHOLANGIOGRAM (N/A)  Patient Location: PACU  Anesthesia Type:General  Level of Consciousness: awake  Airway and Oxygen Therapy: Patient Spontanous Breathing and Patient connected to nasal cannula oxygen  Post-op Pain: mild  Post-op Assessment: Post-op Vital signs reviewed, Patient's Cardiovascular Status Stable, Respiratory Function Stable, Patent Airway, No signs of Nausea or vomiting and Pain level controlled              Post-op Vital Signs: Reviewed and stable  Last Vitals:  Filed Vitals:   09/23/14 1421  BP: 152/76  Pulse: 96  Temp: 36.5 C  Resp: 17    Complications: No apparent anesthesia complications

## 2014-09-24 DIAGNOSIS — I10 Essential (primary) hypertension: Secondary | ICD-10-CM | POA: Diagnosis not present

## 2014-09-24 DIAGNOSIS — K219 Gastro-esophageal reflux disease without esophagitis: Secondary | ICD-10-CM | POA: Diagnosis not present

## 2014-09-24 DIAGNOSIS — J449 Chronic obstructive pulmonary disease, unspecified: Secondary | ICD-10-CM | POA: Diagnosis not present

## 2014-09-24 DIAGNOSIS — K8010 Calculus of gallbladder with chronic cholecystitis without obstruction: Secondary | ICD-10-CM | POA: Diagnosis not present

## 2014-09-24 DIAGNOSIS — F419 Anxiety disorder, unspecified: Secondary | ICD-10-CM | POA: Diagnosis not present

## 2014-09-24 DIAGNOSIS — I2510 Atherosclerotic heart disease of native coronary artery without angina pectoris: Secondary | ICD-10-CM | POA: Diagnosis not present

## 2014-09-24 NOTE — Progress Notes (Signed)
1 Day Post-Op  Subjective: No complaints  Objective: Vital signs in last 24 hours: Temp:  [97.6 F (36.4 C)-98.4 F (36.9 C)] 98.4 F (36.9 C) (06/16 [id number]) Pulse Rate:  [72-103] 72 (06/16 0633) Resp:  [11-23] 16 (06/16 0633) BP: (97-161)/(43-81) 134/75 mmHg (06/16 0633) SpO2:  [88 %-100 %] 93 % (06/16 [id number]) Weight:  [98.884 kg (218 lb)] 98.884 kg (218 lb) (06/15 1421) Last BM Date: 09/23/14  Intake/Output from previous day: 06/15 0701 - 06/16 0700 In: 2595 [P.O.:1080; I.V.:1515] Out: [id number] [Urine:4275] Intake/Output this shift:    Resp: clear to auscultation bilaterally Cardio: regular rate and rhythm GI: soft, minimal tenderness  Lab Results:  No results for input(s): WBC, HGB, HCT, PLT in the last 72 hours. BMET No results for input(s): NA, K, CL, CO2, GLUCOSE, BUN, CREATININE, CALCIUM in the last 72 hours. PT/INR No results for input(s): LABPROT, INR in the last 72 hours. ABG No results for input(s): PHART, HCO3 in the last 72 hours.  Invalid input(s): PCO2, PO2  Studies/Results: Dg Cholangiogram Operative  09/23/2014   CLINICAL DATA:  Gallbladder disease, cholelithiasis  EXAM: INTRAOPERATIVE CHOLANGIOGRAM  TECHNIQUE: Cholangiographic images from the C-arm fluoroscopic device were submitted for interpretation post-operatively. Please see the procedural report for the amount of contrast and the fluoroscopy time utilized.  COMPARISON:  Ultrasound 02/13/2014  FINDINGS: No persistent filling defects in the common duct. Intrahepatic ducts are incompletely visualized, appearing decompressed centrally. Contrast passes into the duodenum.  : Negative for retained common duct stone.   Electronically Signed   By: Lucrezia Europe M.D.   On: 09/23/2014 12:11   Dg Chest Port 1 View  09/23/2014   CLINICAL DATA:  Cough.  EXAM: PORTABLE CHEST - 1 VIEW  COMPARISON:  07/23/2014  FINDINGS: Cardiac silhouette is upper limits of normal in size, accentuated by portable AP technique and shallow  inspiration. There is crowding of the bronchovascular markings in the lung bases with left basilar atelectasis. There is no evidence of segmental airspace consolidation, edema, pleural effusion, or pneumothorax. Thoracic spondylosis is noted.  IMPRESSION: Low lung volumes without evidence of acute airspace disease.   Electronically Signed   By: Logan Bores   On: 09/23/2014 13:22    Anti-infectives: Anti-infectives    Start     Dose/Rate Route Frequency Ordered Stop   09/23/14 0659  ceFAZolin (ANCEF) IVPB 2 g/50 mL premix     2 g 100 mL/hr over 30 Minutes Intravenous On call to O.R. 09/23/14 0659 09/23/14 0845      Assessment/Plan: s/p Procedure(s): LAPAROSCOPIC CHOLECYSTECTOMY WITH INTRAOPERATIVE CHOLANGIOGRAM (N/A) Advance diet Discharge     TOTH [name],PAUL S 09/24/2014

## 2014-09-25 ENCOUNTER — Encounter (HOSPITAL_COMMUNITY): Payer: Self-pay | Admitting: General Surgery

## 2014-09-28 NOTE — Discharge Summary (Signed)
Physician Discharge Summary  Patient ID: Marco Acevedo MRN: [id number] DOB/AGE: 06-23-1934 80 y.o.  Admit date: 09/23/2014 Discharge date: 09/28/2014  Admission Diagnoses:  Discharge Diagnoses:  Active Problems:   Status post surgery   Gallstones   Discharged Condition: good  Hospital Course: The pt underwent lap chole with ioc. He tolerated surgery well. Postop he had O2 sats in the high 80's so he stayed overnight. Once he got a breathing treatment his sats were fine. On pod 1 he was ready for discharge home  Consults: None  Significant Diagnostic Studies: none  Treatments: surgery: as above  Discharge Exam: Blood pressure 134/75, pulse 72, temperature 98.4 F (36.9 C), temperature source Oral, resp. rate 16, height 5\' 8"  (1.727 m), weight 98.884 kg (218 lb), SpO2 93 %. Resp: clear to auscultation bilaterally Cardio: regular rate and rhythm GI: soft, nontender  Disposition: 01-Home or Self Care  Discharge Instructions    Call MD for:  difficulty breathing, headache or visual disturbances    Complete by:  As directed      Call MD for:  difficulty breathing, headache or visual disturbances    Complete by:  As directed      Call MD for:  extreme fatigue    Complete by:  As directed      Call MD for:  extreme fatigue    Complete by:  As directed      Call MD for:  hives    Complete by:  As directed      Call MD for:  hives    Complete by:  As directed      Call MD for:  persistant dizziness or light-headedness    Complete by:  As directed      Call MD for:  persistant dizziness or light-headedness    Complete by:  As directed      Call MD for:  persistant nausea and vomiting    Complete by:  As directed      Call MD for:  persistant nausea and vomiting    Complete by:  As directed      Call MD for:  redness, tenderness, or signs of infection (pain, swelling, redness, odor or green/yellow discharge around incision site)    Complete by:  As directed      Call MD  for:  redness, tenderness, or signs of infection (pain, swelling, redness, odor or green/yellow discharge around incision site)    Complete by:  As directed      Call MD for:  severe uncontrolled pain    Complete by:  As directed      Call MD for:  severe uncontrolled pain    Complete by:  As directed      Call MD for:  temperature >100.4    Complete by:  As directed      Call MD for:  temperature >100.4    Complete by:  As directed      Diet - low sodium heart healthy    Complete by:  As directed      Diet - low sodium heart healthy    Complete by:  As directed      Diet - low sodium heart healthy    Complete by:  As directed      Discharge instructions    Complete by:  As directed   May shower. No heavy lifting. Low fat diet     Discharge instructions    Complete by:  As directed  May shower. Low fat diet. No heavy lifting     Increase activity slowly    Complete by:  As directed      Increase activity slowly    Complete by:  As directed      Increase activity slowly    Complete by:  As directed      No wound care    Complete by:  As directed      No wound care    Complete by:  As directed             Medication List    STOP taking these medications        nitroGLYCERIN 0.4 MG SL tablet  Commonly known as:  NITROSTAT      TAKE these medications        allopurinol 100 MG tablet  Commonly known as:  ZYLOPRIM  Take 100 mg by mouth daily.     aspirin EC 81 MG tablet  Take 81 mg by mouth daily.     clopidogrel 75 MG tablet  Commonly known as:  PLAVIX  Take 75 mg by mouth daily.     finasteride 5 MG tablet  Commonly known as:  PROSCAR  Take 1 tablet (5 mg total) by mouth daily.     gemfibrozil 600 MG tablet  Commonly known as:  LOPID  Take 600 mg by mouth daily.     glyBURIDE 5 MG tablet  Commonly known as:  DIABETA  Take 5 mg by mouth daily.     guaifenesin 400 MG Tabs tablet  Commonly known as:  HUMIBID E  Take 800 mg by mouth daily as needed  (cough).     Iron 325 (65 FE) MG Tabs  Take 1 tablet by mouth daily.     isosorbide mononitrate 60 MG 24 hr tablet  Commonly known as:  IMDUR  Take 1 tablet (60 mg total) by mouth daily.     lisinopril 10 MG tablet  Commonly known as:  PRINIVIL,ZESTRIL  Take 10 mg by mouth daily.     metFORMIN 500 MG tablet  Commonly known as:  GLUCOPHAGE  Take 500 mg by mouth 2 (two) times daily.     omeprazole 20 MG capsule  Commonly known as:  PRILOSEC  Take 20 mg by mouth daily.     oxyCODONE-acetaminophen 5-325 MG per tablet  Commonly known as:  ROXICET  Take 1-2 tablets by mouth every 4 (four) hours as needed.     pravastatin 40 MG tablet  Commonly known as:  PRAVACHOL  Take 40 mg by mouth daily.     primidone 50 MG tablet  Commonly known as:  MYSOLINE  Take 50 mg by mouth at bedtime.     PROVENTIL HFA 108 (90 BASE) MCG/ACT inhaler  Generic drug:  albuterol  Inhale 2 puffs into the lungs every 6 (six) hours as needed for wheezing or shortness of breath.     albuterol (2.5 MG/3ML) 0.083% nebulizer solution  Commonly known as:  PROVENTIL  Take 2.5 mg by nebulization every 6 (six) hours as needed for wheezing or shortness of breath.     tiotropium 18 MCG inhalation capsule  Commonly known as:  SPIRIVA  Place 36 mcg into inhaler and inhale every morning.     vitamin B-12 1000 MCG tablet  Commonly known as:  CYANOCOBALAMIN  Take 1,000 mcg by mouth daily.           Follow-up Information    Follow  up with Merrie Roof, MD In 2 weeks.   Specialty:  General Surgery   Contact information:   [place] [id number] [phone]       Signed: Merrie Roof 09/28/2014, 3:15 PM

## 2014-10-05 ENCOUNTER — Other Ambulatory Visit: Payer: Self-pay

## 2014-10-19 ENCOUNTER — Telehealth: Payer: Self-pay | Admitting: *Deleted

## 2014-10-19 NOTE — Telephone Encounter (Signed)
Start taking 1/2 tab (30 mg) daily. If okay after 1 weeks with no increase angina, can then stop. Inform if increased symptoms.

## 2014-10-19 NOTE — Telephone Encounter (Signed)
Patient called and wanted to see if he could discontinue taking the isosorbide. He tells me that he was prescribed this medication to help him with some situational chest pain related to the death of a family member. He feels like this medication causes him some lightheadedness as well. Please advise. Thanks, MI

## 2014-10-20 NOTE — Telephone Encounter (Signed)
Patient aware of Dr Darliss Ridgel recommendation and will call the office with any symptoms.

## 2014-11-02 ENCOUNTER — Emergency Department (HOSPITAL_COMMUNITY): Payer: Medicare Other

## 2014-11-02 ENCOUNTER — Telehealth: Payer: Self-pay | Admitting: Interventional Cardiology

## 2014-11-02 ENCOUNTER — Encounter (HOSPITAL_COMMUNITY): Payer: Self-pay | Admitting: Family Medicine

## 2014-11-02 ENCOUNTER — Emergency Department (HOSPITAL_COMMUNITY)
Admission: EM | Admit: 2014-11-02 | Discharge: 2014-11-02 | Disposition: A | Discharge status: Home / Self Care | Payer: Medicare Other | Attending: Emergency Medicine | Admitting: Emergency Medicine

## 2014-11-02 DIAGNOSIS — I2510 Atherosclerotic heart disease of native coronary artery without angina pectoris: Secondary | ICD-10-CM | POA: Diagnosis not present

## 2014-11-02 DIAGNOSIS — J449 Chronic obstructive pulmonary disease, unspecified: Secondary | ICD-10-CM | POA: Insufficient documentation

## 2014-11-02 DIAGNOSIS — Z7902 Long term (current) use of antithrombotics/antiplatelets: Secondary | ICD-10-CM | POA: Diagnosis not present

## 2014-11-02 DIAGNOSIS — Z87891 Personal history of nicotine dependence: Secondary | ICD-10-CM | POA: Diagnosis not present

## 2014-11-02 DIAGNOSIS — Z79899 Other long term (current) drug therapy: Secondary | ICD-10-CM | POA: Diagnosis not present

## 2014-11-02 DIAGNOSIS — Z7982 Long term (current) use of aspirin: Secondary | ICD-10-CM | POA: Insufficient documentation

## 2014-11-02 DIAGNOSIS — Z87442 Personal history of urinary calculi: Secondary | ICD-10-CM | POA: Diagnosis not present

## 2014-11-02 DIAGNOSIS — K219 Gastro-esophageal reflux disease without esophagitis: Secondary | ICD-10-CM | POA: Diagnosis not present

## 2014-11-02 DIAGNOSIS — R0789 Other chest pain: Secondary | ICD-10-CM | POA: Diagnosis not present

## 2014-11-02 DIAGNOSIS — E785 Hyperlipidemia, unspecified: Secondary | ICD-10-CM | POA: Insufficient documentation

## 2014-11-02 DIAGNOSIS — I10 Essential (primary) hypertension: Secondary | ICD-10-CM | POA: Diagnosis not present

## 2014-11-02 DIAGNOSIS — E119 Type 2 diabetes mellitus without complications: Secondary | ICD-10-CM | POA: Diagnosis not present

## 2014-11-02 DIAGNOSIS — R0602 Shortness of breath: Secondary | ICD-10-CM | POA: Diagnosis not present

## 2014-11-02 DIAGNOSIS — M109 Gout, unspecified: Secondary | ICD-10-CM | POA: Insufficient documentation

## 2014-11-02 DIAGNOSIS — R079 Chest pain, unspecified: Secondary | ICD-10-CM | POA: Diagnosis present

## 2014-11-02 LAB — BASIC METABOLIC PANEL
Anion gap: 10 (ref 5–15)
BUN: 21 mg/dL — AB (ref 6–20)
CO2: 23 mmol/L (ref 22–32)
CREATININE: 1.99 mg/dL — AB (ref 0.61–1.24)
Calcium: 10 mg/dL (ref 8.9–10.3)
Chloride: 104 mmol/L (ref 101–111)
GFR, EST AFRICAN AMERICAN: 36 mL/min — AB (ref >60)
GFR, EST NON AFRICAN AMERICAN: 31 mL/min — AB (ref >60)
Glucose, Bld: 74 mg/dL (ref 65–99)
Potassium: 4.1 mmol/L (ref 3.5–5.1)
Sodium: 137 mmol/L (ref 135–145)

## 2014-11-02 LAB — CBC
HCT: 35.8 % — ABNORMAL LOW (ref 39.0–52.0)
Hemoglobin: 12.6 g/dL — ABNORMAL LOW (ref 13.0–17.0)
MCH: 29.4 pg (ref 26.0–34.0)
MCHC: 35.2 g/dL (ref 30.0–36.0)
MCV: 83.6 fL (ref 78.0–100.0)
Platelets: 274 10*3/uL (ref 150–400)
RBC: 4.28 MIL/uL (ref 4.22–5.81)
RDW: 13.9 % (ref 11.5–15.5)
WBC: 7.2 10*3/uL (ref 4.0–10.5)

## 2014-11-02 LAB — I-STAT TROPONIN, ED: TROPONIN I, POC: 0.01 ng/mL (ref 0.00–0.08)

## 2014-11-02 MED ORDER — SUCRALFATE 1 GM/10ML PO SUSP
1.0000 g | Freq: Three times a day (TID) | ORAL | Status: DC
  Administered 2014-11-02: 1 g via ORAL
  Filled 2014-11-02 (×2): qty 10

## 2014-11-02 MED ORDER — OMEPRAZOLE 20 MG PO CPDR
20.0000 mg | DELAYED_RELEASE_CAPSULE | Freq: Two times a day (BID) | ORAL | Status: DC
Start: 2014-11-02 — End: 2016-06-26

## 2014-11-02 MED ORDER — SUCRALFATE 1 GM/10ML PO SUSP: 1.0000 g | Freq: Four times a day (QID) | ORAL | Status: DC

## 2014-11-02 NOTE — Discharge Instructions (Signed)
As discussed, it is important that you follow up as soon as possible with your physician for continued management of your condition.  Your pain is likely due to irritation of the stomach and esophagus.  If you develop any new, or concerning changes in your condition, please return to the emergency department immediately.

## 2014-11-02 NOTE — Telephone Encounter (Signed)
New message    Pt having chest discomfort which come and goes Pt states not having chest pain Pt is back on isosorbide Pt state he started having discomfort after eating honey bun and he took a nitro and discomfort went away Pt states he has gas in the morning Pt states he think it may be problem with gallbladder and not heart issue but would like to talk to nurse. Please call to discuss

## 2014-11-02 NOTE — ED Notes (Signed)
Attempted to call Pharmacy for follow up on Carafate for MD

## 2014-11-02 NOTE — ED Notes (Signed)
Called Pharmacy to follow up on Carafate.

## 2014-11-02 NOTE — ED Notes (Signed)
Pt here for intermittent episodes of chest discomfort for about a week. sts he has taken nitro multiple times. sts recent visits in the hospital and o2 levels have been low. sts he has been contributing the discomfort to indigestion due to it worsening after food. Pt recent gall bladder removal.

## 2014-11-02 NOTE — Telephone Encounter (Signed)
Pt states he had gall bladder surgery 1 month ago, gall bladder was removed.  Pt states prior to gall bladder surgery his son passed away, he was having some problems, isosorbide was prescribed.  Pt states after gall bladder surgery he was feeling pretty good, had asked Dr Tamala Julian if he could stop isosorbide. Pt states he was told to cut Isosorbide in half until he finished his current supply then he could stop taking it.  Pt states he finished his current isosorbide prescription on Friday. Pt states he ate [place] food Sat afternoon, Saturday evening he developed chest pain, took NTG with some relief, but never completely resolved.  Pt states he ate bagel with smoked salmon yesterday afternoon, chest pain became worse, he had a bowel movement, took breathing treatment, took NTG with some relief, pain never completely resolved.  Pt states he did have some nausea yesterday.  Pt played golf this morning, ate honey bun, and felt terrible pain in his chest immediately after eating honey bun.  Pt took NTG, pain improved, but has not completely resolved.  Pt continues to have discomfort in his chest, pt unsure if this is similar to pain prior to stent placement. Pt advised to report to [facility] ED for further evaluation, pt advised not to drive.  Pt agreed

## 2014-11-02 NOTE — ED Provider Notes (Signed)
CSN: [id number]     Arrival date & time 11/02/14  1644 History   First MD Initiated Contact with Patient 11/02/14 1814     Chief Complaint  Patient presents with  . Chest Pain    HPI  Patient presents with concern of episodic chest discomfort. Patient has multiple medical issues, including CAD, with stents placed 3 years ago. Over approximately last 2 weeks, but more prominently over the past week patient has had episodes of chest pressure, including with bloating, gas filled sensation. Symptoms seem to occur after eating, and in particular, after eating [place] food 2 nights ago the patient was particularly symptomatic. Throughout these episodes he has had no dyspnea, has had nausea, has not vomited. Nitroglycerin does not seem to change symptoms, but after not eating for some time, symptoms seem minimal. Patient had similar symptoms in the past, and acknowledges halving his omeprazole dosing at the suggestion of his [place] several months ago.   Past Medical History  Diagnosis Date  . Hyperlipidemia   . CAD (coronary artery disease)     no prior MI, PCI x 2 in 2009 and 02/2011 in [place]  . GERD (gastroesophageal reflux disease)   . BPH (benign prostatic hyperplasia)   . Granuloma annulare   . Gout   . Hypertension   . Wears dentures     top  . Wears glasses   . Shortness of breath dyspnea     with exertion  . Diabetes mellitus type II     type 2  . Diarrhea 2015    had for a year and a half  . History of kidney stones   . Bruises easily   . Claustrophobia   . COPD (chronic obstructive pulmonary disease)    Past Surgical History  Procedure Laterality Date  . None    . Colonoscopy    . Lipoma excision      Biospy only; left parotid gland  . Eye surgery      both cataracts  . Blepharoplasty    . Knee arthroscopy with medial menisectomy Right 08/19/2013    Procedure: RIGHT KNEE ARTHROSCOPY WITH PARTIAL MEDIAL MENISECTOMY AND CHONDROPLASTY;   Surgeon: Hessie Dibble, MD;  Location: [place];  Service: Orthopedics;  Laterality: Right;  . Left heart catheterization with coronary angiogram N/A 10/03/2011    Procedure: LEFT HEART CATHETERIZATION WITH CORONARY ANGIOGRAM;  Surgeon: Sinclair Grooms, MD;  Location: [facility] CATH LAB;  Service: Cardiovascular;  Laterality: N/A;  . Left heart catheterization with coronary angiogram N/A 09/12/2012    Procedure: LEFT HEART CATHETERIZATION WITH CORONARY ANGIOGRAM;  Surgeon: Sinclair Grooms, MD;  Location: [facility] CATH LAB;  Service: Cardiovascular;  Laterality: N/A;  . Percutaneous coronary stent intervention (pci-s) N/A 09/17/2012    Procedure: PERCUTANEOUS CORONARY STENT INTERVENTION (PCI-S);  Surgeon: Sinclair Grooms, MD;  Location: [facility] CATH LAB;  Service: Cardiovascular;  Laterality: N/A;  . Cardiac catheterization  [id number]    X 2 stents  . Cholecystectomy  09/23/2014  . Cholecystectomy N/A 09/23/2014    Procedure: LAPAROSCOPIC CHOLECYSTECTOMY WITH INTRAOPERATIVE CHOLANGIOGRAM;  Surgeon: Autumn Messing [name], MD;  Location: [place];  Service: General;  Laterality: N/A;   Family History  Problem Relation Age of Onset  . Aortic aneurysm Father   . Aneurysm Mother     brain  . Cancer Brother   . COPD Sister    History  Substance Use Topics  . Smoking status: Former Smoker --  1.00 packs/day for 64 years    Types: Cigarettes    Quit date: 05/29/2011  . Smokeless tobacco: Never Used  . Alcohol Use: No    Review of Systems  Constitutional:       Per HPI, otherwise negative  HENT:       Per HPI, otherwise negative  Respiratory:       Per HPI, otherwise negative  Cardiovascular:       Per HPI, otherwise negative  Gastrointestinal: Positive for nausea. Negative for vomiting.  Endocrine:       Negative aside from HPI  Genitourinary:       Neg aside from HPI   Musculoskeletal:       Per HPI, otherwise negative  Skin: Negative.   Neurological: Negative for syncope.       Allergies  Review of patient's allergies indicates no known allergies.  Home Medications   Prior to Admission medications   Medication Sig Start Date End Date Taking? Authorizing Provider  albuterol (PROVENTIL HFA) 108 (90 BASE) MCG/ACT inhaler Inhale 2 puffs into the lungs every 6 (six) hours as needed for wheezing or shortness of breath.     Historical Provider, MD  albuterol (PROVENTIL) (2.5 MG/3ML) 0.083% nebulizer solution Take 2.5 mg by nebulization every 6 (six) hours as needed for wheezing or shortness of breath.     Historical Provider, MD  allopurinol (ZYLOPRIM) 100 MG tablet Take 100 mg by mouth daily.    Historical Provider, MD  aspirin EC 81 MG tablet Take 81 mg by mouth daily.    Historical Provider, MD  clopidogrel (PLAVIX) 75 MG tablet Take 75 mg by mouth daily.    Historical Provider, MD  Ferrous Sulfate (IRON) 325 (65 FE) MG TABS Take 1 tablet by mouth daily.    Historical Provider, MD  finasteride (PROSCAR) 5 MG tablet Take 1 tablet (5 mg total) by mouth daily. 11/29/11   Rigoberto Noel, MD  gemfibrozil (LOPID) 600 MG tablet Take 600 mg by mouth daily.    Historical Provider, MD  glyBURIDE (DIABETA) 5 MG tablet Take 5 mg by mouth daily.    Historical Provider, MD  guaifenesin (HUMIBID E) 400 MG TABS tablet Take 800 mg by mouth daily as needed (cough).     Historical Provider, MD  isosorbide mononitrate (IMDUR) 60 MG 24 hr tablet Take 1 tablet (60 mg total) by mouth daily. 07/24/14   Belva Crome, MD  lisinopril (PRINIVIL,ZESTRIL) 10 MG tablet Take 10 mg by mouth daily.    Historical Provider, MD  metFORMIN (GLUCOPHAGE) 500 MG tablet Take 500 mg by mouth 2 (two) times daily.    Historical Provider, MD  omeprazole (PRILOSEC) 20 MG capsule Take 1 capsule (20 mg total) by mouth 2 (two) times daily before a meal. 11/02/14   Carmin Muskrat, MD  oxyCODONE-acetaminophen (ROXICET) 5-325 MG per tablet Take 1-2 tablets by mouth every 4 (four) hours as needed. 09/23/14   Autumn Messing  [name], MD  pravastatin (PRAVACHOL) 40 MG tablet Take 40 mg by mouth daily.    Historical Provider, MD  primidone (MYSOLINE) 50 MG tablet Take 50 mg by mouth at bedtime.     Historical Provider, MD  sucralfate (CARAFATE) 1 GM/10ML suspension Take 10 mLs (1 g total) by mouth 4 (four) times daily. 11/02/14 11/09/14  Carmin Muskrat, MD  tiotropium (SPIRIVA) 18 MCG inhalation capsule Place 36 mcg into inhaler and inhale every morning.     Historical Provider, MD  vitamin B-12 (CYANOCOBALAMIN)  1000 MCG tablet Take 1,000 mcg by mouth daily.    Historical Provider, MD   BP 167/76 mmHg  Pulse 82  Temp(Src) 98.5 F (36.9 C) (Oral)  Resp 18  SpO2 98% Physical Exam  Constitutional: He is oriented to person, place, and time. He appears well-developed. No distress.  HENT:  Head: Normocephalic and atraumatic.  Eyes: Conjunctivae and EOM are normal.  Cardiovascular: Normal rate and regular rhythm.   Pulmonary/Chest: Effort normal. No stridor. No respiratory distress.  Abdominal: He exhibits no distension. There is no tenderness. There is no rebound and no guarding.  Musculoskeletal: He exhibits no edema.  Neurological: He is alert and oriented to person, place, and time.  Skin: Skin is warm and dry.  Psychiatric: He has a normal mood and affect.  Nursing note and vitals reviewed.   ED Course  Procedures (including critical care time) Labs Review Labs Reviewed  BASIC METABOLIC PANEL - Abnormal; Notable for the following:    BUN 21 (*)    Creatinine, Ser 1.99 (*)    GFR calc non Af Amer 31 (*)    GFR calc Af Amer 36 (*)    All other components within normal limits  CBC - Abnormal; Notable for the following:    Hemoglobin 12.6 (*)    HCT 35.8 (*)    All other components within normal limits  I-STAT TROPOININ, ED    Imaging Review Dg Chest 2 View  11/02/2014   CLINICAL DATA:  Chest pressure and tightness, 4 days duration. Slight shortness of breath.  EXAM: CHEST  2 VIEW  COMPARISON:   09/23/2014  FINDINGS: Heart size is normal. There is mild calcification of the thoracic aorta. The lungs are clear. The vascularity is normal. No effusions. Ordinary degenerative changes affect the spine.  IMPRESSION: No active cardiopulmonary disease.   Electronically Signed   By: Nelson Chimes M.D.   On: 11/02/2014 17:50     EKG Interpretation   Date/Time:  Monday November 02 2014 16:50:10 EDT Ventricular Rate:  89 PR Interval:  172 QRS Duration: 78 QT Interval:  338 QTC Calculation: 411 R Axis:   35 Text Interpretation:  Sinus rhythm with marked sinus arrhythmia Otherwise  normal ECG Sinus rhythm Artifact T wave abnormality Abnormal ekg Confirmed  by Carmin Muskrat  MD [phone]) on 11/02/2014 6:17:52 PM     Chart review demonstrates no catheterization the past 2 years, the multiple discussions with his cardiology team, and recent surgery for cholecystectomy.   8:46 PM On repeat exam the patient appears well.  He states that he is hungry.   Following intake of food, the patient notes return of pressure sensation in the sternal area. He, and multiple family members and I discussed all findings at length, particularly the patient's pain after eating we discussed the low probability of cardiac etiology given the food associated sternal discomfort, the high suspicion for gastroesophageal irritation. Patient and family voiced understanding of return precautions, care instructions, follow-up instructions.   MDM   Final diagnoses:  Atypical chest pain   Patient presents with ongoing episodic chest pain. There are some concern for angina initially, the patient's description of symptoms, and recurrence of symptoms following oral intake suggests gastroesophageal etiology. Given the duration of symptoms, negative troponin, nonischemic EKG are also reassuring for the low suspicion of ongoing ischemia. Patient is appropriately taking Plavix. Patient had new medications provided, was discharged  in stable condition with close outpatient follow-up.  Carmin Muskrat, MD 11/02/14 2048

## 2014-11-11 DIAGNOSIS — I2510 Atherosclerotic heart disease of native coronary artery without angina pectoris: Secondary | ICD-10-CM | POA: Diagnosis not present

## 2014-11-11 DIAGNOSIS — K219 Gastro-esophageal reflux disease without esophagitis: Secondary | ICD-10-CM | POA: Diagnosis not present

## 2014-11-11 DIAGNOSIS — R0789 Other chest pain: Secondary | ICD-10-CM | POA: Diagnosis not present

## 2014-11-16 ENCOUNTER — Telehealth: Payer: Self-pay | Admitting: Interventional Cardiology

## 2014-11-16 NOTE — Telephone Encounter (Signed)
I spoke with the patient. She reports that he had called about 2 weeks ago with complaints of chest discomfort. He was seen in the ER and his symptoms were deemed to be most likely non-cardiac. The patient tells me today that he had his gallbladder removed about 5-6 weeks ago with Dr. Marlou Starks. He has continued to have some chest discomfort. He has seen GI- Dr. Michail Sermon and started on carafate QID.  He started this about a week ago and is feeling some relief. He saw the PA for Dr. Michail Sermon last Monday and was scheduled for an endoscopy on 11/19/14. Per the patient, Dr. Michail Sermon now states he won't do the endoscopy with out him being seen by Dr. Tamala Julian or without Dr. Tamala Julian calling Dr. Michail Sermon to discuss this. I advised the patient that Dr. Tamala Julian is out of the office today, but will be back tomorrow.  I will forward this to Dr. Tamala Julian for review and recommendations. I advised him he should get a call back tomorrow. I have called Dr. Kathline Magic office and left a message for Dr. Michail Sermon and his PA as to what is going on per the patient's request.  Dr. Nolen Mu GI [phone]

## 2014-11-16 NOTE — Telephone Encounter (Signed)
Need to discuss medical issues that has to do with chest pains. Was to have a procedure but was told I need to see Dr. Tamala Julian to get clearence for this procedure.  There is a lot going on that I need to discuss with you.

## 2014-11-18 NOTE — Telephone Encounter (Signed)
Have him come in to be seen Friday on my quarter day.

## 2014-11-18 NOTE — Telephone Encounter (Signed)
Returned call to [facility] @ Dr.Schooler's office. Lmom. Dr.Smith is not in the office today, he is currently @ [facility] in the cath lab. I will fwd Dr.Smith a high priority message to call Dr.Schooler today. She is to call back if further assistance is needed.

## 2014-11-18 NOTE — Telephone Encounter (Signed)
New Message  RN from Dr Michail Sermon office calling to speak w/ Marco Acevedo, to follow up on previous note about pt's chest pain in relation to his surgery scheduled for 8/11. Please call office back at listed number.

## 2014-11-19 NOTE — Telephone Encounter (Signed)
Pt aware of work in appt with [facility] for 8/12 @ 8:30am

## 2014-11-19 NOTE — Progress Notes (Signed)
Cardiology Office Note   Date:  11/20/2014   ID:  Marco Acevedo, DOB March 19, 1935, MRN [id number]  PCP:   Marco Crutch, MD  Cardiologist:  Marco Grooms, MD   Chief Complaint  Patient presents with  . Coronary Artery Disease      History of Present Illness: Marco Acevedo is a 80 y.o. male who presents for CAD, COPD, hyperlipidemia, obstructive sleep apnea, and chronic kidney disease. Patient has had circumflex stent 3, most recently 2014.  The patient has had recurring episodes of variable threshold chest pressure and tightness. Also occurs at rest. He is been undergoing GI evaluation. He feels overall that his chest complaints of gotten better on Carafate. He is scheduled have an upper GI endoscopy. He denies orthopnea. He plays golf twice per week without significant limitation although there are occasions when his chest is tight while playing. He occasionally takes nitroglycerin and there is some improvement. He has had a relatively recent emergency room visit because of chest discomfort.    Past Medical History  Diagnosis Date  . Hyperlipidemia   . CAD (coronary artery disease)     no prior MI, PCI x 2 in 2009 and 02/2011 in [place]  . GERD (gastroesophageal reflux disease)   . BPH (benign prostatic hyperplasia)   . Granuloma annulare   . Gout   . Hypertension   . Wears dentures     top  . Wears glasses   . Shortness of breath dyspnea     with exertion  . Diabetes mellitus type II     type 2  . Diarrhea 2015    had for a year and a half  . History of kidney stones   . Bruises easily   . Claustrophobia   . COPD (chronic obstructive pulmonary disease)     Past Surgical History  Procedure Laterality Date  . None    . Colonoscopy    . Lipoma excision      Biospy only; left parotid gland  . Eye surgery      both cataracts  . Blepharoplasty    . Knee arthroscopy with medial menisectomy Right 08/19/2013    Procedure: RIGHT KNEE ARTHROSCOPY WITH PARTIAL  MEDIAL MENISECTOMY AND CHONDROPLASTY;  Surgeon: Hessie Dibble, MD;  Location: [place];  Service: Orthopedics;  Laterality: Right;  . Left heart catheterization with coronary angiogram N/A 10/03/2011    Procedure: LEFT HEART CATHETERIZATION WITH CORONARY ANGIOGRAM;  Surgeon: Marco Grooms, MD;  Location: [facility] CATH LAB;  Service: Cardiovascular;  Laterality: N/A;  . Left heart catheterization with coronary angiogram N/A 09/12/2012    Procedure: LEFT HEART CATHETERIZATION WITH CORONARY ANGIOGRAM;  Surgeon: Marco Grooms, MD;  Location: [facility] CATH LAB;  Service: Cardiovascular;  Laterality: N/A;  . Percutaneous coronary stent intervention (pci-s) N/A 09/17/2012    Procedure: PERCUTANEOUS CORONARY STENT INTERVENTION (PCI-S);  Surgeon: Marco Grooms, MD;  Location: [facility] CATH LAB;  Service: Cardiovascular;  Laterality: N/A;  . Cardiac catheterization  [id number]    X 2 stents  . Cholecystectomy  09/23/2014  . Cholecystectomy N/A 09/23/2014    Procedure: LAPAROSCOPIC CHOLECYSTECTOMY WITH INTRAOPERATIVE CHOLANGIOGRAM;  Surgeon: Marco Messing [name], MD;  Location: [place];  Service: General;  Laterality: N/A;     Current Outpatient Prescriptions  Medication Sig Dispense Refill  . albuterol (PROVENTIL HFA) 108 (90 BASE) MCG/ACT inhaler Inhale 2 puffs into the lungs every 6 (six) hours as needed for wheezing  or shortness of breath.     [name] albuterol (PROVENTIL) (2.5 MG/3ML) 0.083% nebulizer solution Take 2.5 mg by nebulization every 6 (six) hours as needed for wheezing or shortness of breath.     . allopurinol (ZYLOPRIM) 100 MG tablet Take 100 mg by mouth daily.    [name] aspirin EC 81 MG tablet Take 81 mg by mouth daily.    . clopidogrel (PLAVIX) 75 MG tablet Take 75 mg by mouth daily.    . Ferrous Sulfate (IRON) 325 (65 FE) MG TABS Take 1 tablet by mouth daily.    . finasteride (PROSCAR) 5 MG tablet Take 1 tablet (5 mg total) by mouth daily. 30 tablet 5  . gemfibrozil (LOPID) 600 MG tablet Take  600 mg by mouth daily.    [name] glyBURIDE (DIABETA) 5 MG tablet Take 5 mg by mouth daily.    [name] guaifenesin (HUMIBID E) 400 MG TABS tablet Take 800 mg by mouth daily as needed (cough).     . metFORMIN (GLUCOPHAGE) 500 MG tablet Take 500 mg by mouth 2 (two) times daily.    . nitroGLYCERIN (NITROSTAT) 0.4 MG SL tablet Place 0.4 mg under the tongue every 5 (five) minutes as needed for chest pain.    [name] omeprazole (PRILOSEC) 20 MG capsule Take 1 capsule (20 mg total) by mouth 2 (two) times daily before a meal. 60 capsule 0  . pravastatin (PRAVACHOL) 40 MG tablet Take 40 mg by mouth daily.    . primidone (MYSOLINE) 50 MG tablet Take 50 mg by mouth at bedtime.     [name] tiotropium (SPIRIVA) 18 MCG inhalation capsule Place 36 mcg into inhaler and inhale every morning.     . vitamin B-12 (CYANOCOBALAMIN) 1000 MCG tablet Take 1,000 mcg by mouth daily.    . isosorbide mononitrate (IMDUR) 120 MG 24 hr tablet Take 1 tablet (120 mg total) by mouth daily. 30 tablet 11   No current facility-administered medications for this visit.    Allergies:   Review of patient's allergies indicates no known allergies.    Social History:  The patient  reports that he quit smoking about 3 years ago. His smoking use included Cigarettes. He has a 64 pack-year smoking history. He has never used smokeless tobacco. He reports that he does not drink alcohol or use illicit drugs.   Family History:  The patient's family history includes Aneurysm in his mother; Aortic aneurysm in his father; COPD in his sister; Cancer in his brother; Other in his brother, sister, and sister.    ROS:  Please see the history of present illness.   Otherwise, review of systems are positive for change in appetite, abdominal discomfort, easy bruising, and fatigue..   All other systems are reviewed and negative.    PHYSICAL EXAM: VS:  BP 156/84 mmHg  Pulse 75  Ht 5\' 9"  (1.753 m)  Wt 96.072 kg (211 lb 12.8 oz)  BMI 31.26 kg/m2 , BMI Body mass index is 31.26  kg/(m^2). GEN: Well nourished, well developed, in no acute distress HEENT: normal Neck: no JVD, carotid bruits, or masses Cardiac: RRR; no murmurs, rubs, or gallops,no edema  Respiratory:  clear to auscultation bilaterally, normal work of breathing GI: soft, nontender, nondistended, + BS MS: no deformity or atrophy Skin: warm and dry, no rash Neuro:  Strength and sensation are intact Psych: euthymic mood, full affect   EKG:  EKG is ordered today. The ekg ordered today demonstrates normal sinus rhythm with normal tracing and occasional PAC's  Recent Labs: 09/16/2014: ALT 17 11/02/2014: BUN 21*; Creatinine, Ser 1.99*; Hemoglobin 12.6*; Platelets 274; Potassium 4.1; Sodium 137    Lipid Panel    Component Value Date/Time   CHOL 187 10/03/2011 0836   TRIG 189* 10/03/2011 0836   HDL 48 10/03/2011 0836   CHOLHDL 3.9 10/03/2011 0836   VLDL 38 10/03/2011 0836   LDLCALC 101* 10/03/2011 0836      Wt Readings from Last 3 Encounters:  11/20/14 96.072 kg (211 lb 12.8 oz)  09/23/14 98.884 kg (218 lb)  09/16/14 98.748 kg (217 lb 11.2 oz)      Other studies Reviewed: Additional studies/ records that were reviewed today include: . Review of the above records demonstrates: Reviewed GI workup to this point. Has been some improvement in symptoms with Carafate.   ASSESSMENT AND PLAN:  1. Preop cardiovascular exam We will hold with upper GI endoscopy until we get a nuclear study to rule out high risk.  2. Atherosclerosis of native coronary artery with other form of angina pectoris We will further titrate antianginal regimen.  3. Essential hypertension Further titrating the antianginal regimen will help better control the blood pressure.  4. CKD (chronic kidney disease) stage 3, GFR 30-59 ml/min No change  5. OSA (obstructive sleep apnea) No change  6. Simple chronic bronchitis Not addressed   Current medicines are reviewed at length with the patient today.  The patient has  concerns regarding medicines.  The following changes have been made:  Resume titrate Imdur dose from 60-120 mg. A myocardial perfusion study will be done to exclude high risk. If the study is unremarkable we will proceed with upper GI endoscopy.  Labs/ tests ordered today include:   Orders Placed This Encounter  Procedures  . Myocardial Perfusion Imaging  . EKG 12-Lead     Disposition:   FU with HS in 6 weeks  Signed, Marco Grooms, MD  11/20/2014 9:26 AM    [address], [place], [place]  [address] Phone: [phone]; Fax: [phone]

## 2014-11-20 ENCOUNTER — Encounter: Payer: Self-pay | Admitting: Interventional Cardiology

## 2014-11-20 ENCOUNTER — Ambulatory Visit (INDEPENDENT_AMBULATORY_CARE_PROVIDER_SITE_OTHER): Payer: Medicare Other | Admitting: Interventional Cardiology

## 2014-11-20 VITALS — BP 156/84 | HR 75 | Ht 69.0 in | Wt 211.8 lb

## 2014-11-20 DIAGNOSIS — I25118 Atherosclerotic heart disease of native coronary artery with other forms of angina pectoris: Secondary | ICD-10-CM

## 2014-11-20 DIAGNOSIS — N183 Chronic kidney disease, stage 3 unspecified: Secondary | ICD-10-CM

## 2014-11-20 DIAGNOSIS — I10 Essential (primary) hypertension: Secondary | ICD-10-CM

## 2014-11-20 DIAGNOSIS — Z01810 Encounter for preprocedural cardiovascular examination: Secondary | ICD-10-CM | POA: Diagnosis not present

## 2014-11-20 DIAGNOSIS — J410 Simple chronic bronchitis: Secondary | ICD-10-CM

## 2014-11-20 DIAGNOSIS — I2510 Atherosclerotic heart disease of native coronary artery without angina pectoris: Secondary | ICD-10-CM | POA: Diagnosis not present

## 2014-11-20 DIAGNOSIS — G4733 Obstructive sleep apnea (adult) (pediatric): Secondary | ICD-10-CM

## 2014-11-20 MED ORDER — ISOSORBIDE MONONITRATE ER 120 MG PO TB24: 120.0000 mg | ORAL_TABLET | Freq: Every day | ORAL | Status: DC

## 2014-11-20 MED ORDER — ISOSORBIDE MONONITRATE ER 120 MG PO TB24
120.0000 mg | ORAL_TABLET | Freq: Every day | ORAL | Status: AC
Start: 2014-11-20 — End: ?

## 2014-11-20 NOTE — Patient Instructions (Addendum)
Medication Instructions:  INCREASE Imdur to 120mg  daily. An Rx has been sent to your pharmacy  Labwork: None   Testing/Procedures: Your physician has requested that you have a lexiscan myoview. For further information please visit [email or web address]. Please follow instruction sheet, as given.   Follow-Up: Your physician recommends that you schedule a follow-up appointment as planned or sooner pending test results   Any Other Special Instructions Will Be Listed Below (If Applicable).

## 2014-11-23 ENCOUNTER — Telehealth (HOSPITAL_COMMUNITY): Payer: Self-pay

## 2014-11-23 NOTE — Telephone Encounter (Signed)
Patient given detailed instructions per Myocardial Perfusion Study Information Sheet for test on 11-25-2014 at 0745. Patient Notified to arrive 15 minutes early, and that it is imperative to arrive on time for appointment to keep from having the test rescheduled. Patient verbalized understanding. Marco Acevedo, [name] A

## 2014-11-25 ENCOUNTER — Ambulatory Visit (HOSPITAL_COMMUNITY): Payer: Medicare Other | Attending: Cardiovascular Disease

## 2014-11-25 DIAGNOSIS — Z01810 Encounter for preprocedural cardiovascular examination: Secondary | ICD-10-CM | POA: Insufficient documentation

## 2014-11-25 DIAGNOSIS — R9439 Abnormal result of other cardiovascular function study: Secondary | ICD-10-CM | POA: Insufficient documentation

## 2014-11-25 DIAGNOSIS — I10 Essential (primary) hypertension: Secondary | ICD-10-CM | POA: Insufficient documentation

## 2014-11-25 LAB — MYOCARDIAL PERFUSION IMAGING
LV dias vol: 61 mL
LVSYSVOL: 17 mL
NUC STRESS TID: 1.11
Peak HR: 97 {beats}/min
RATE: 0.31
Rest HR: 82 {beats}/min
SDS: 3
SRS: 4
SSS: 7

## 2014-11-25 MED ORDER — REGADENOSON 0.4 MG/5ML IV SOLN
0.4000 mg | Freq: Once | INTRAVENOUS | Status: AC
  Administered 2014-11-25: 0.4 mg via INTRAVENOUS

## 2014-11-25 MED ORDER — TECHNETIUM TC 99M SESTAMIBI GENERIC - CARDIOLITE
10.2000 | Freq: Once | INTRAVENOUS | Status: AC | PRN
  Administered 2014-11-25: 10 via INTRAVENOUS

## 2014-11-25 MED ORDER — TECHNETIUM TC 99M SESTAMIBI GENERIC - CARDIOLITE
32.7000 | Freq: Once | INTRAVENOUS | Status: AC | PRN
  Administered 2014-11-25: 32.7 via INTRAVENOUS

## 2014-11-26 ENCOUNTER — Telehealth: Payer: Self-pay

## 2014-11-26 DIAGNOSIS — Z01812 Encounter for preprocedural laboratory examination: Secondary | ICD-10-CM

## 2014-11-26 NOTE — Telephone Encounter (Signed)
-----   Message from Belva Crome, MD sent at 11/25/2014  7:14 PM EDT ----- The study is abnormal. There is mild abnormality on the bottom left side of the heart, where prior stent was placed.  Also a small area on the front wall of the heart. Heart strength is normal.  This study is graded to be intermediate risk. If patient's symptoms are improved on medical therapy, he is cleared to proceed with endoscopy by Dr. Michail Sermon. This still having chest discomfort on medical therapy, he will need coronary angiography.

## 2014-11-26 NOTE — Telephone Encounter (Signed)
Pt aware of myoview results.  The study is abnormal. There is mild abnormality on the bottom left side of the heart, where prior stent was placed. Also a small area on the front wall of the heart. Heart strength is normal. This study is graded to be intermediate risk. If patient's symptoms are improved on medical therapy, he is cleared to proceed with endoscopy by Dr. Michail Sermon. This still having chest discomfort on medical therapy, he will need coronary angiography.       Pt sts that he is still having a "twinging in his chest" it occurs daily and rest and with activity. Pt sts that he did golf yesterday he had 1-2 episodes that were short in duration. There are no symptoms associated with discomfort. Pt reported having the "twinging in his chest" while on the phone with me.pt sts that is lsted a couple of seconds. Adv pt that I will fwd an update to Dr.Smith and call back with his recommendation. Pt agreeable with plan and verbalized understanding.

## 2014-11-30 NOTE — Telephone Encounter (Signed)
Pt aware cardiac cath scheduled with Dr.Smith for Fri 8/26 @ 7:30am. Pt will come to the office on 8/25 for pre-procedure labs. Pt given verbal pre-procedure instructions, written instructions left at the front desk for pt to pick up. Pt sts that he would like to speak with Dr.Smith directly prior to his cath. Adv pt Dr.smith is out of town until 8/25. I will fwd Dr.Smith a message to call him. Pt agreeable with plan and verbalized understanding.

## 2014-11-30 NOTE — Telephone Encounter (Signed)
-----   Message from Belva Crome, MD sent at 11/27/2014  3:44 PM EDT ----- Schedule coronary angiography and possible PCI for my next available Cath Lab date. He will need to have pre-cath labs and orders written.

## 2014-11-30 NOTE — Telephone Encounter (Signed)
Pt aware of Dr.Smith's recommendation. Schedule coronary angiography and possible PCI for my next available Cath Lab date. He will need to have pre-cath labs and orders written.  Pt sts that he has had improvement in his symptoms the last couple of days since increasing Isosorbide. Pt agreeable to schedule his cardiac cath for 8/26, but would like to have a discussion with Dr.Smith prior to the cath. Adv pt that Dr.Smith is on vacation until 8/25. We will schedule pt's cath for 8/26. Labs for 8/25. I will fwd a message to Dr.Smith to call the pt prior to his cath. Adv pt I will call him back with a scheduled time and pre-procedure  Instructions. Pt verbalized understanding.

## 2014-11-30 NOTE — Telephone Encounter (Signed)
Pt aware of Dr.Smith's recommendation. Schedule coronary angiography and possible PCI for my next available Cath Lab date. He will need to have pre-cath labs and orders written.  Pt sts that he has had an improvement with symptoms

## 2014-12-02 ENCOUNTER — Other Ambulatory Visit: Payer: Self-pay | Admitting: Interventional Cardiology

## 2014-12-03 ENCOUNTER — Other Ambulatory Visit (INDEPENDENT_AMBULATORY_CARE_PROVIDER_SITE_OTHER): Payer: Medicare Other | Admitting: *Deleted

## 2014-12-03 DIAGNOSIS — I10 Essential (primary) hypertension: Secondary | ICD-10-CM

## 2014-12-03 DIAGNOSIS — Z5181 Encounter for therapeutic drug level monitoring: Secondary | ICD-10-CM | POA: Diagnosis not present

## 2014-12-03 DIAGNOSIS — Z01812 Encounter for preprocedural laboratory examination: Secondary | ICD-10-CM | POA: Diagnosis not present

## 2014-12-03 LAB — CBC WITH DIFFERENTIAL/PLATELET
BASOS PCT: 1.1 % (ref 0.0–3.0)
Basophils Absolute: 0.1 10*3/uL (ref 0.0–0.1)
EOS PCT: 3.7 % (ref 0.0–5.0)
Eosinophils Absolute: 0.3 10*3/uL (ref 0.0–0.7)
HEMATOCRIT: 33.7 % — AB (ref 39.0–52.0)
HEMOGLOBIN: 11.4 g/dL — AB (ref 13.0–17.0)
LYMPHS PCT: 28.5 % (ref 12.0–46.0)
Lymphs Abs: 2.1 10*3/uL (ref 0.7–4.0)
MCHC: 33.7 g/dL (ref 30.0–36.0)
MCV: 87.7 fl (ref 78.0–100.0)
MONO ABS: 0.5 10*3/uL (ref 0.1–1.0)
MONOS PCT: 7.5 % (ref 3.0–12.0)
Neutro Abs: 4.3 10*3/uL (ref 1.4–7.7)
Neutrophils Relative %: 59.2 % (ref 43.0–77.0)
Platelets: 302 10*3/uL (ref 150.0–400.0)
RBC: 3.84 Mil/uL — AB (ref 4.22–5.81)
RDW: 14.6 % (ref 11.5–15.5)
WBC: 7.3 10*3/uL (ref 4.0–10.5)

## 2014-12-03 LAB — BASIC METABOLIC PANEL
BUN: 29 mg/dL — ABNORMAL HIGH (ref 6–23)
CHLORIDE: 104 meq/L (ref 96–112)
CO2: 24 mEq/L (ref 19–32)
Calcium: 9.3 mg/dL (ref 8.4–10.5)
Creatinine, Ser: 2 mg/dL — ABNORMAL HIGH (ref 0.40–1.50)
GFR: 34.52 mL/min — AB (ref >60.00)
Glucose, Bld: 71 mg/dL (ref 70–99)
POTASSIUM: 4.3 meq/L (ref 3.5–5.1)
SODIUM: 138 meq/L (ref 135–145)

## 2014-12-03 LAB — PROTIME-INR
INR: 1 ratio (ref 0.8–1.0)
Prothrombin Time: 11.5 s (ref 9.6–13.1)

## 2014-12-04 ENCOUNTER — Ambulatory Visit (HOSPITAL_COMMUNITY)
Admission: RE | Admit: 2014-12-04 | Payer: Medicare Other | Source: Ambulatory Visit | Admitting: Interventional Cardiology

## 2014-12-04 ENCOUNTER — Encounter (HOSPITAL_COMMUNITY): Admission: RE | Payer: Self-pay | Source: Ambulatory Visit

## 2014-12-04 SURGERY — LEFT HEART CATH AND CORONARY ANGIOGRAPHY

## 2014-12-09 DIAGNOSIS — Z23 Encounter for immunization: Secondary | ICD-10-CM | POA: Diagnosis not present

## 2014-12-09 DIAGNOSIS — M109 Gout, unspecified: Secondary | ICD-10-CM | POA: Diagnosis not present

## 2014-12-09 DIAGNOSIS — E1142 Type 2 diabetes mellitus with diabetic polyneuropathy: Secondary | ICD-10-CM | POA: Diagnosis not present

## 2014-12-09 DIAGNOSIS — N183 Chronic kidney disease, stage 3 (moderate): Secondary | ICD-10-CM | POA: Diagnosis not present

## 2015-04-21 DIAGNOSIS — M79675 Pain in left toe(s): Secondary | ICD-10-CM | POA: Diagnosis not present

## 2015-06-01 DIAGNOSIS — M5416 Radiculopathy, lumbar region: Secondary | ICD-10-CM | POA: Diagnosis not present

## 2015-06-01 DIAGNOSIS — M7072 Other bursitis of hip, left hip: Secondary | ICD-10-CM | POA: Diagnosis not present

## 2015-06-02 DIAGNOSIS — M65332 Trigger finger, left middle finger: Secondary | ICD-10-CM | POA: Diagnosis not present

## 2015-06-16 IMAGING — US US OUTSIDE FILMS BODY
1 series · 14 of 16 positions shown · non-contrast
Comparison: none

[Series 1: us outside films body · 14 of 39 slices shown]
[im 1/39]
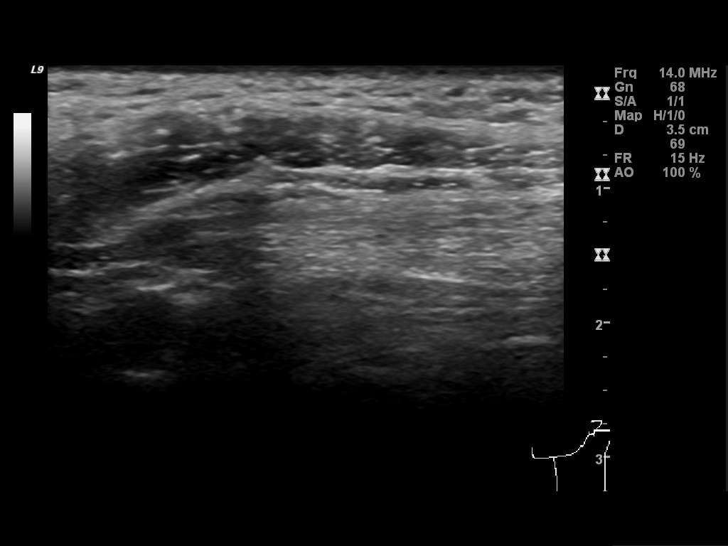
[im 3/39]
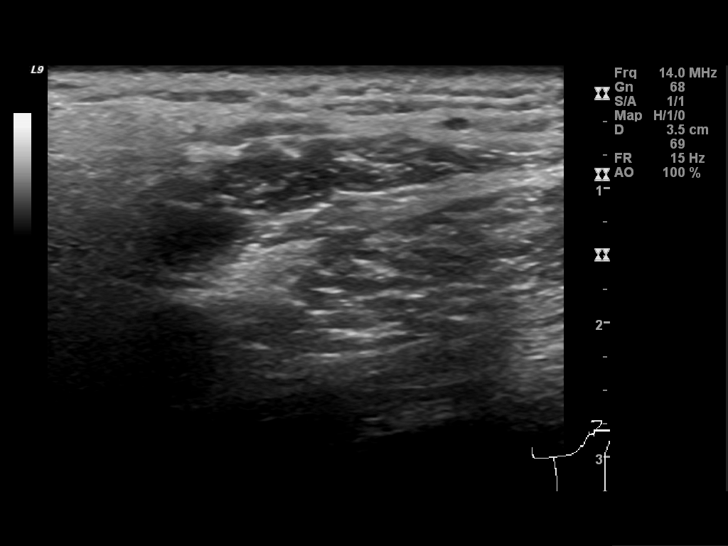
[im 6/39]
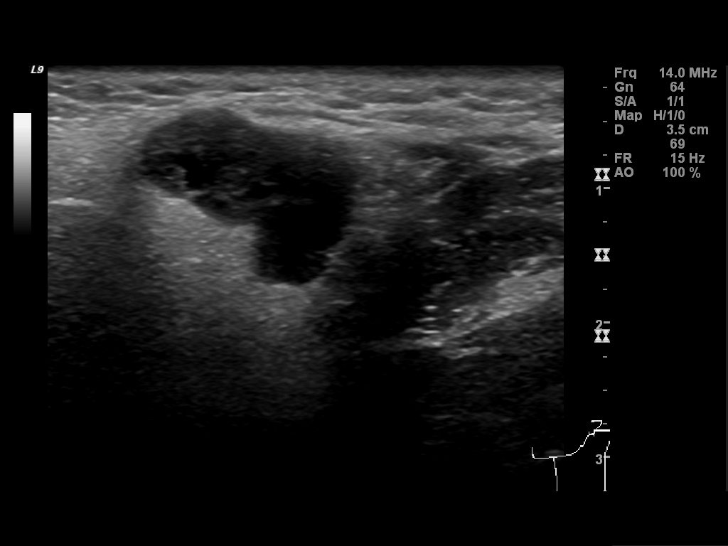
[im 11/39]
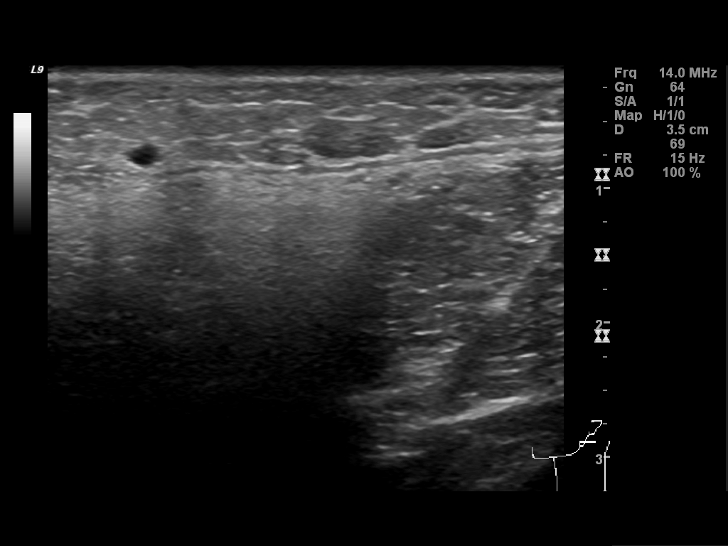
[im 13/39]
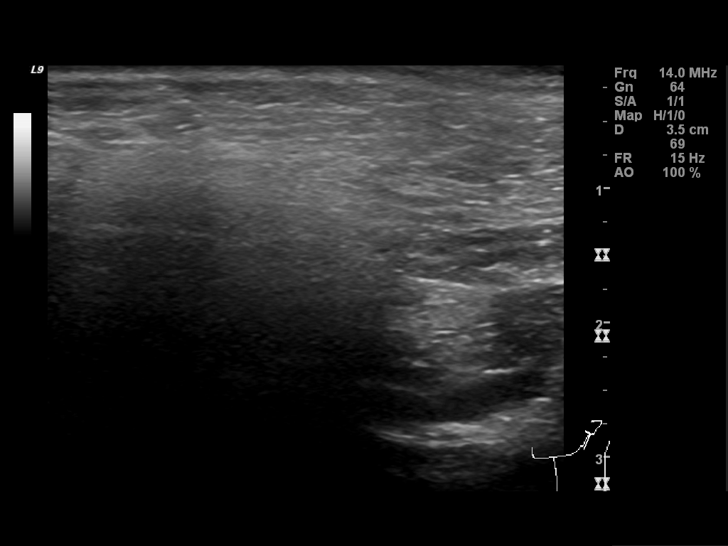
[im 16/39]
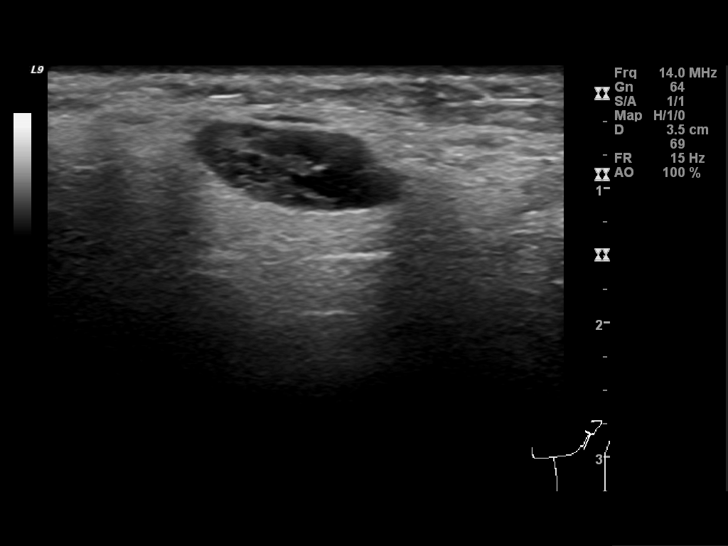
[im 18/39]
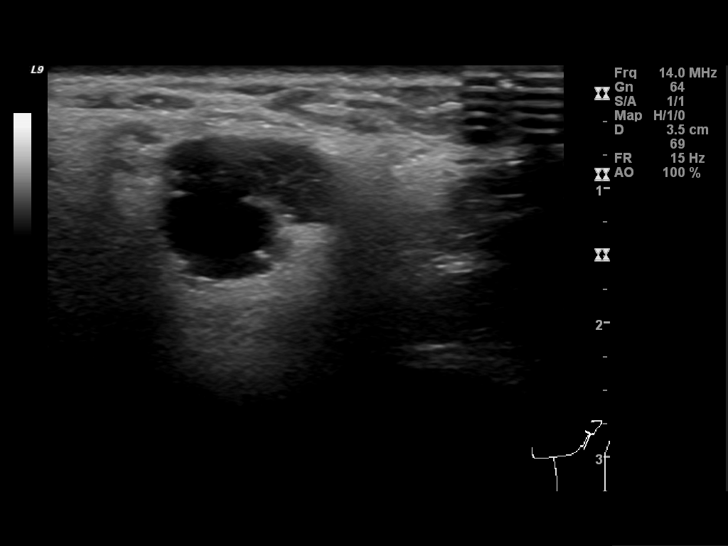
[im 21/39]
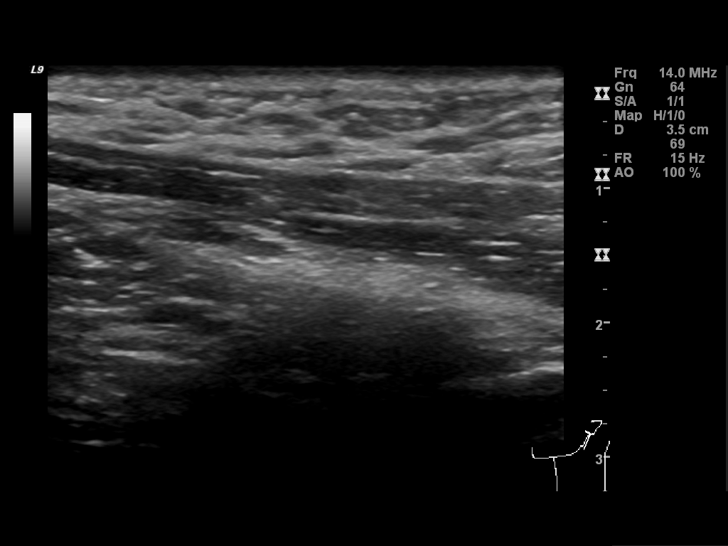
[im 23/39]
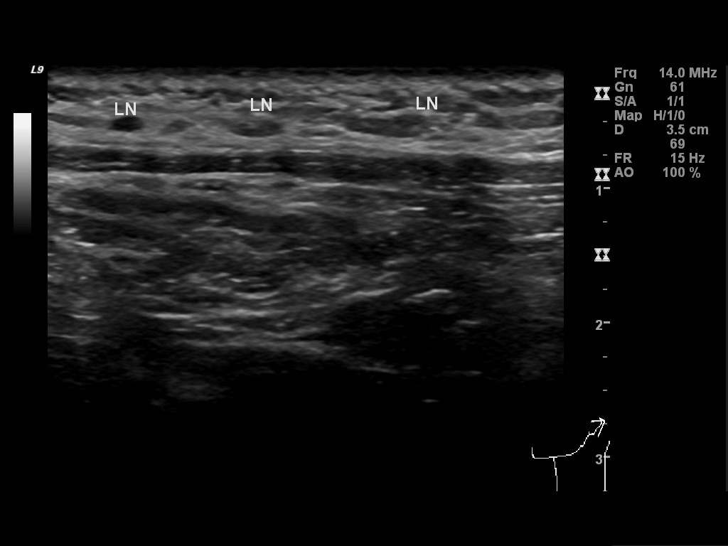
[im 26/39]
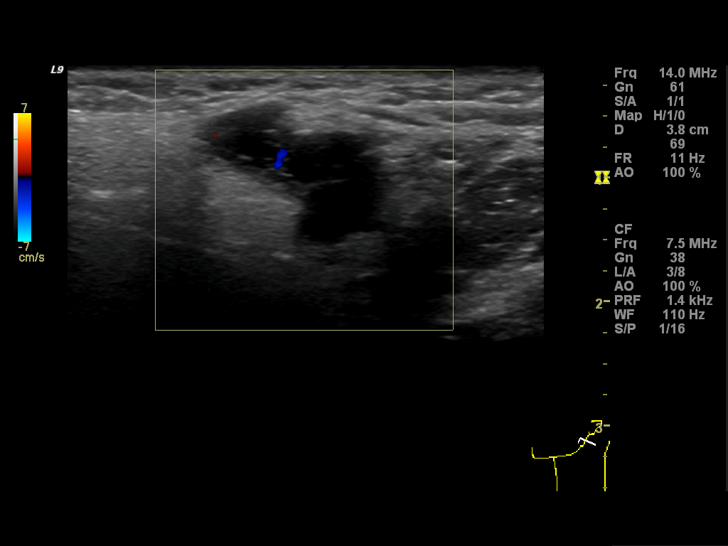
[im 31/39]
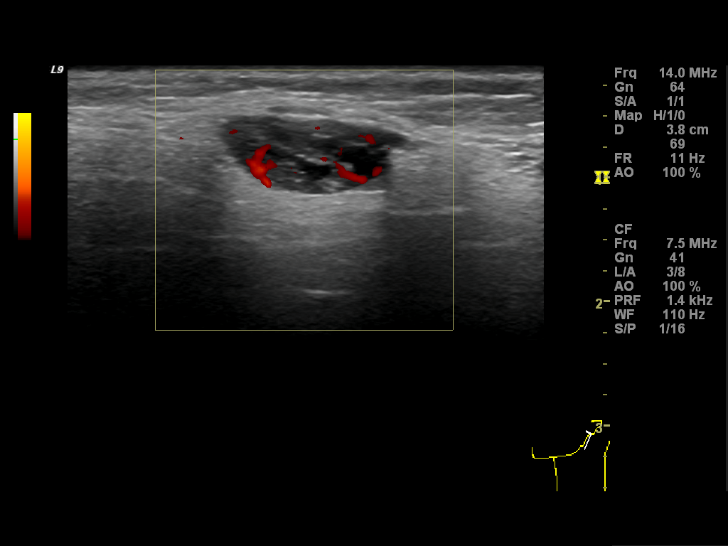
[im 33/39]
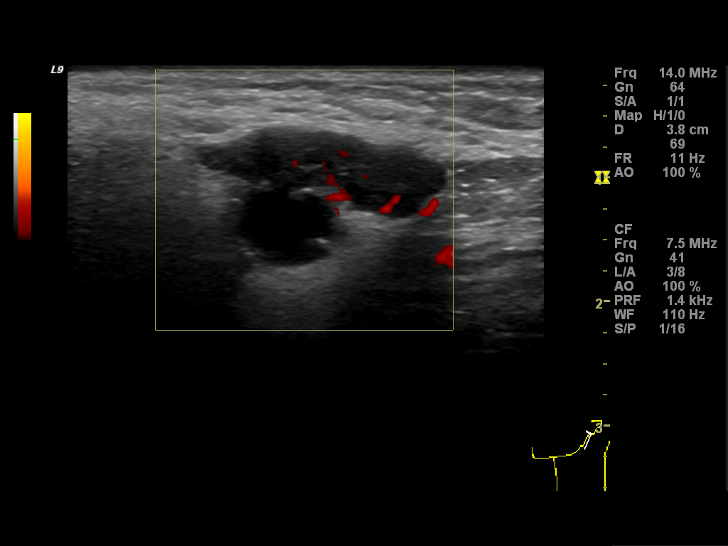
[im 36/39]
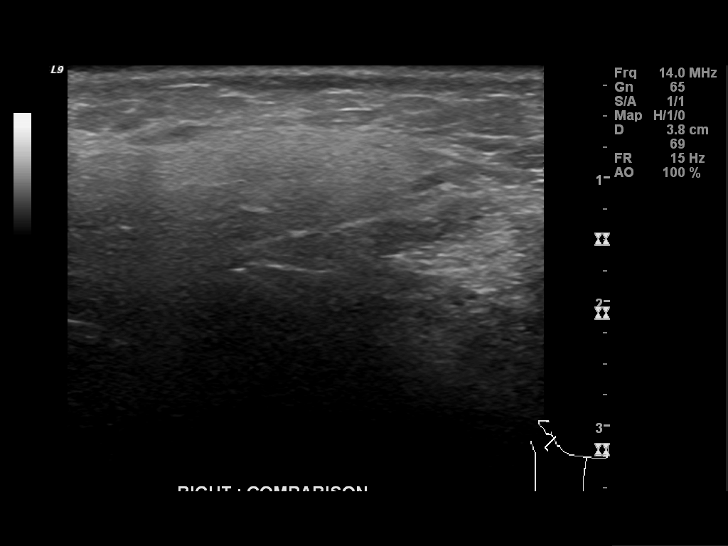
[im 39/39]
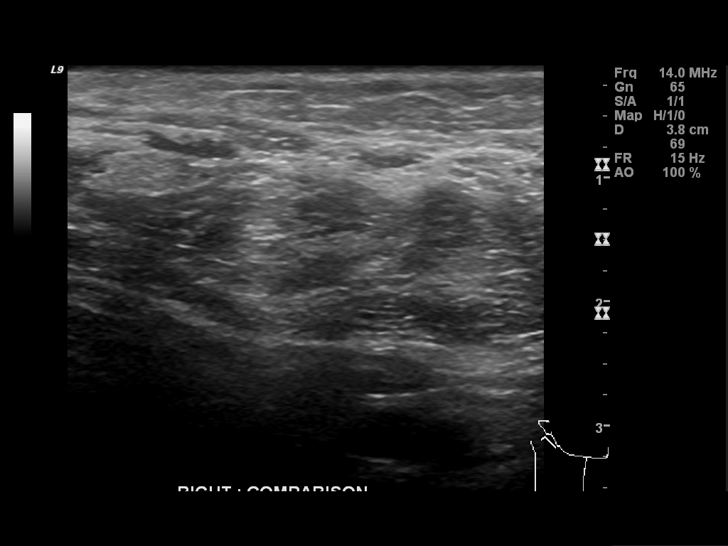

[14 of 16 positions shown; findings below may reference images not displayed]

Canned report from images found in remote index.

Refer to host system for actual result text.

## 2015-06-28 ENCOUNTER — Telehealth: Payer: Self-pay | Admitting: Interventional Cardiology

## 2015-06-28 NOTE — Telephone Encounter (Signed)
Routed to Dr.Smith to advise on whether or not pt should continue Plavix.

## 2015-06-28 NOTE — Telephone Encounter (Signed)
Okay to discontinue Plavix

## 2015-06-28 NOTE — Telephone Encounter (Signed)
New message  Pt c/o medication issue:  1. Name of Medication: clopidogrel (PLAVIX) 75 MG tablet    4. What is your medication issue? Pt called states that the [place] will not fill. Was only supposed to fill it for two years. Asking Dr. Tamala Julian should he stop taking it or if there is anything that can be faxed to the [facility] to re-new this medication

## 2015-06-29 NOTE — Telephone Encounter (Signed)
Pt aware of Dr.Smith's response Okay to discontinue Plavix Pt med list updated. Pt verbalized understanding and appreciation for the call back

## 2015-07-23 IMAGING — CT CT NECK W/ CM
3 of 4 series · 10 of 33 positions shown, 12 images · IV contrast (Iodine)
Comparison: 06/16/2014 ultrasound

CLINICAL DATA: Left-sided neck swelling and pain below the left
ear. This is been present for a few weeks but worsening recently.

EXAM:
CT NECK WITH CONTRAST
TECHNIQUE: Multidetector CT imaging of the neck was performed using the
standard protocol following the bolus administration of intravenous
contrast.
CONTRAST:  75mL OMNIPAQUE IOHEXOL 300 MG/ML  SOLN

[Series 204: orthog · axial · 0.58mm/px · z∈[+121,+192]mm · 2 of 112 slices shown, 3 images]
[im 38/112  soft-tissue]
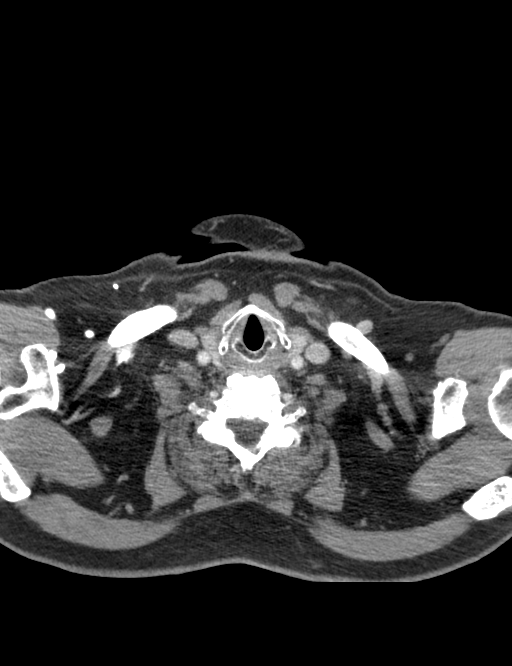
[im 38/112  bone]
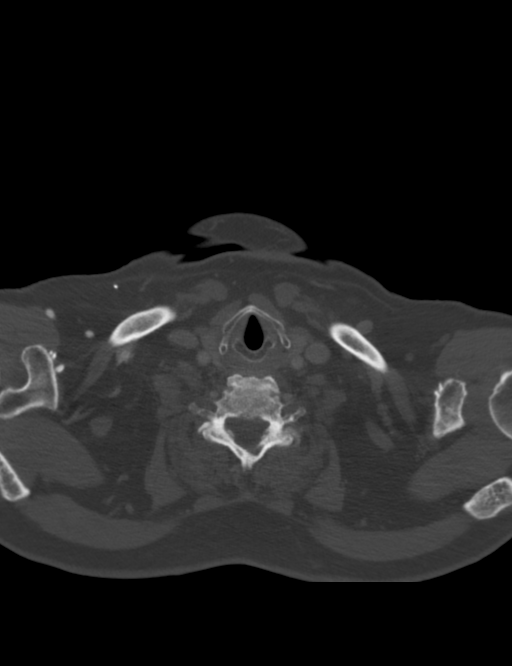
[im 75/112  bone]
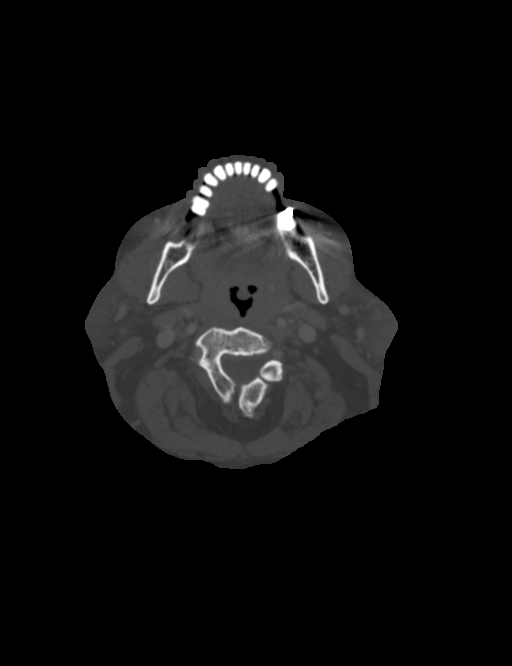

[Series 205: coronal · coronal · 0.52mm/px · 3 of 98 slices shown]
[im 22/98  bone]
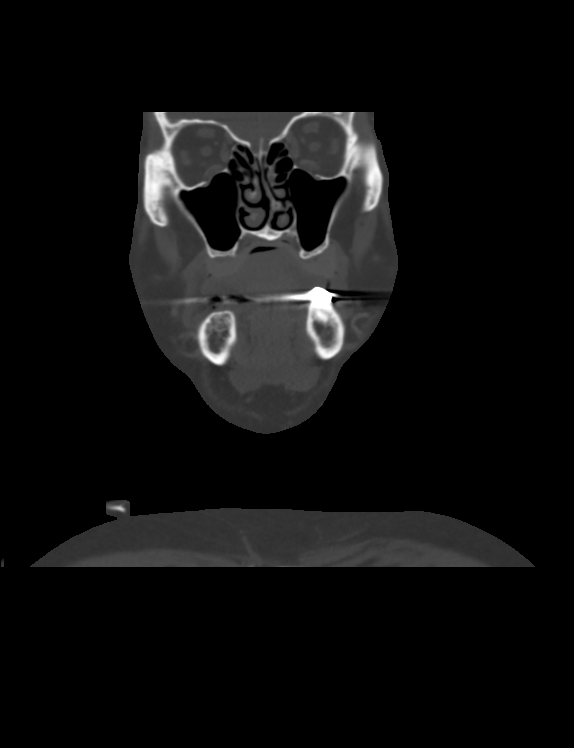
[im 40/98  bone]
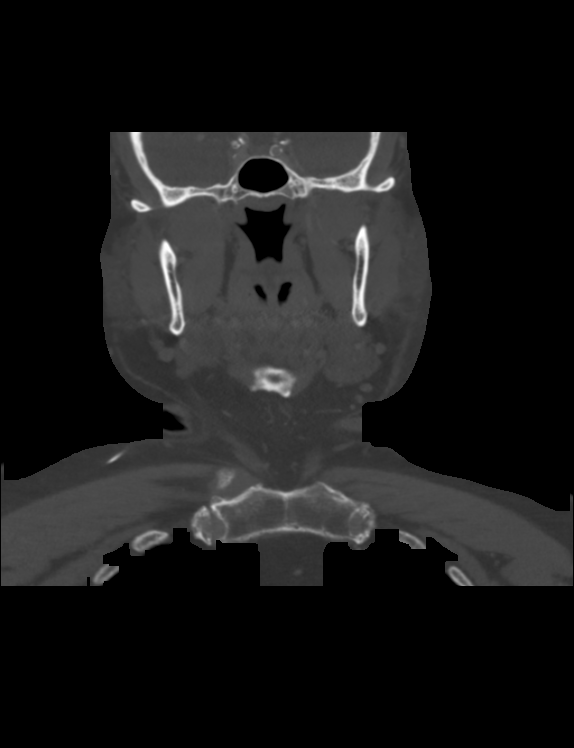
[im 57/98  bone]
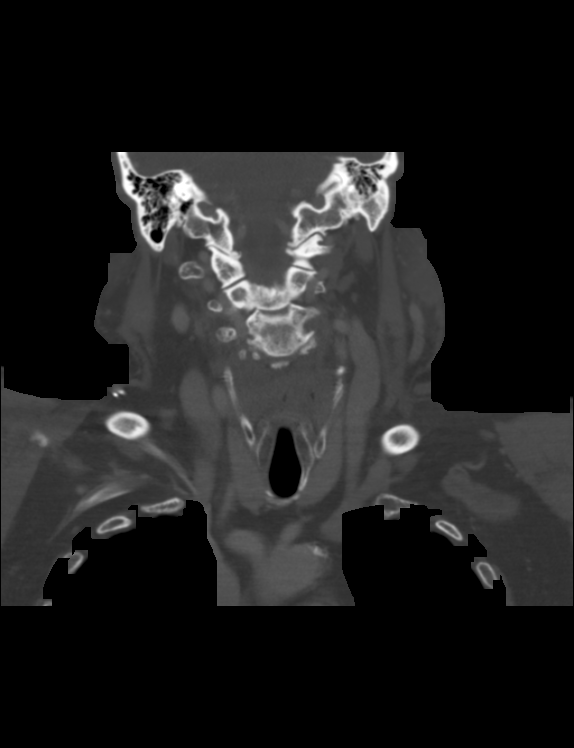

[Series 207: sag · sagittal · 0.50mm/px · 5 of 92 slices shown, 6 images]
[im 31/92  bone]
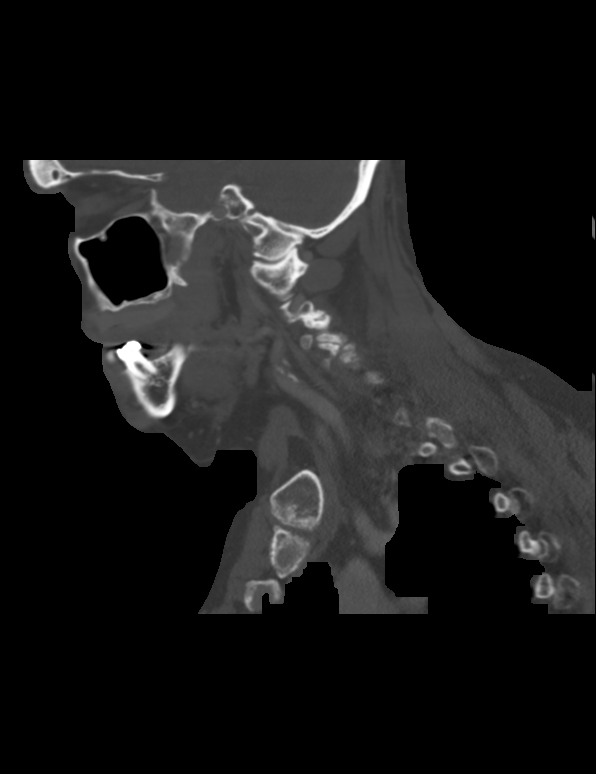
[im 38/92  bone]
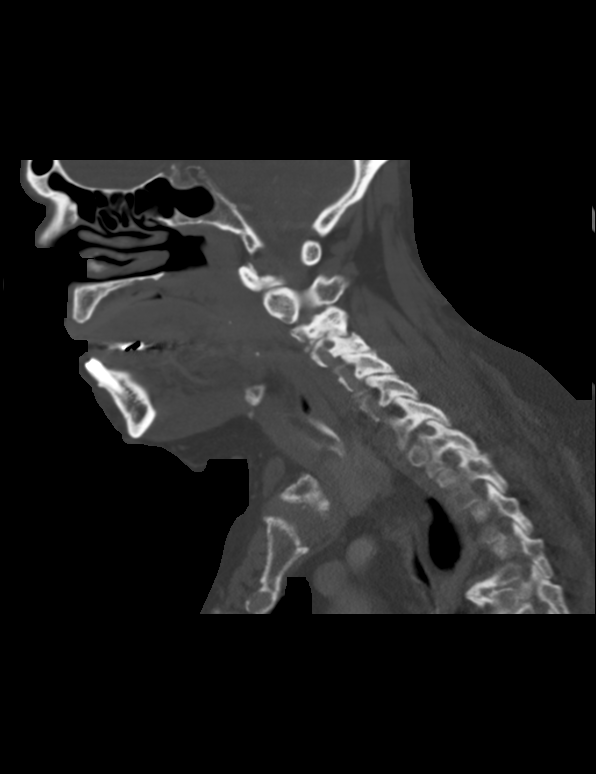
[im 46/92  soft-tissue]
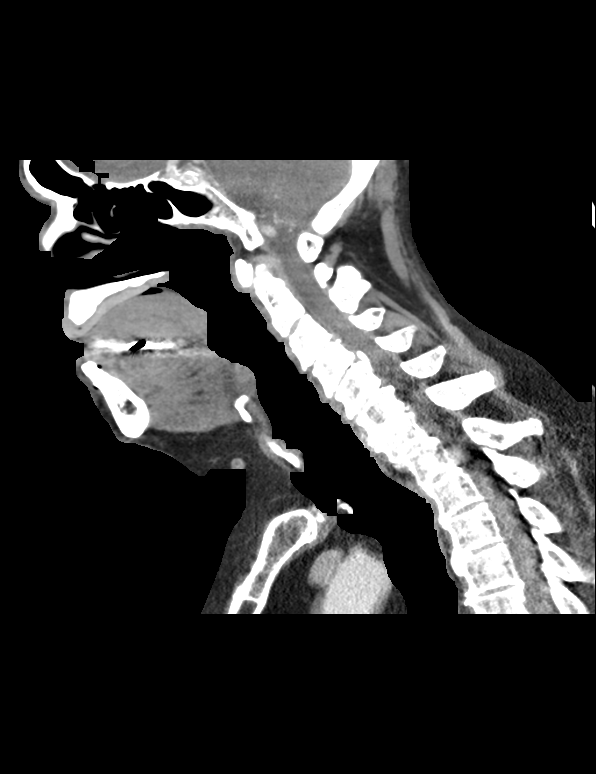
[im 46/92  bone]
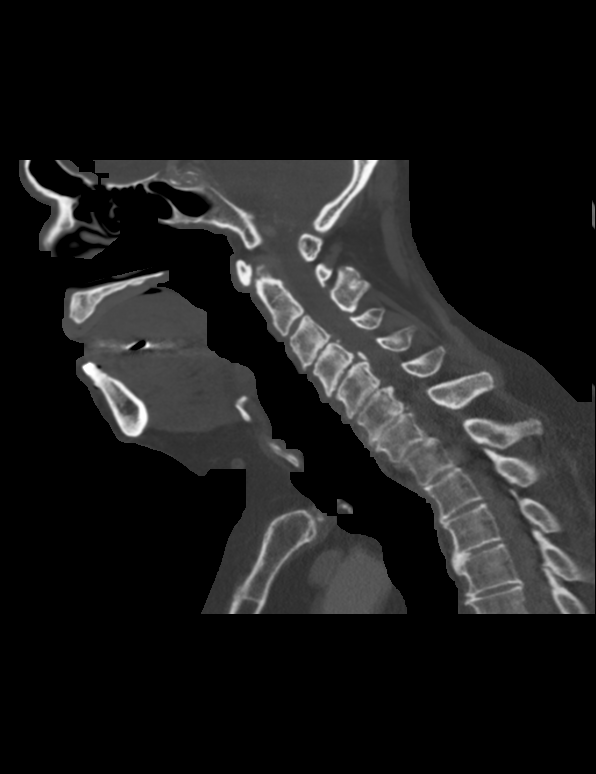
[im 54/92  bone]
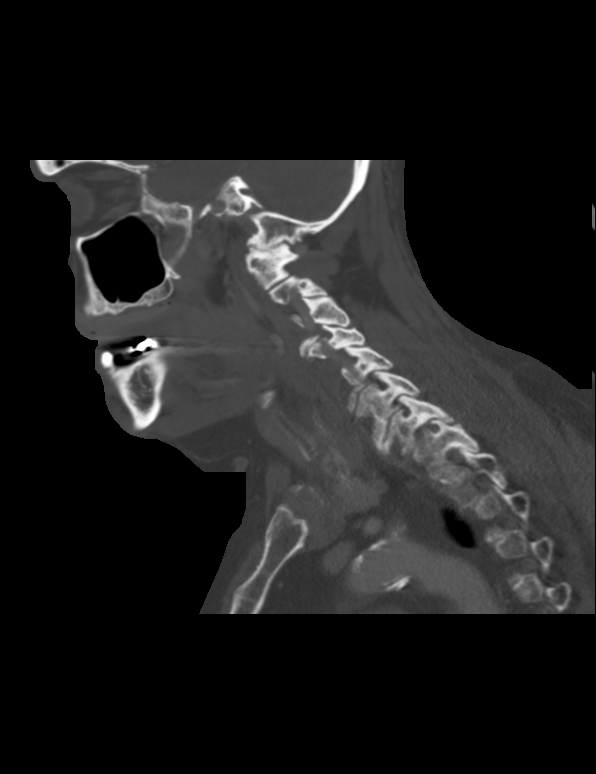
[im 61/92  bone]
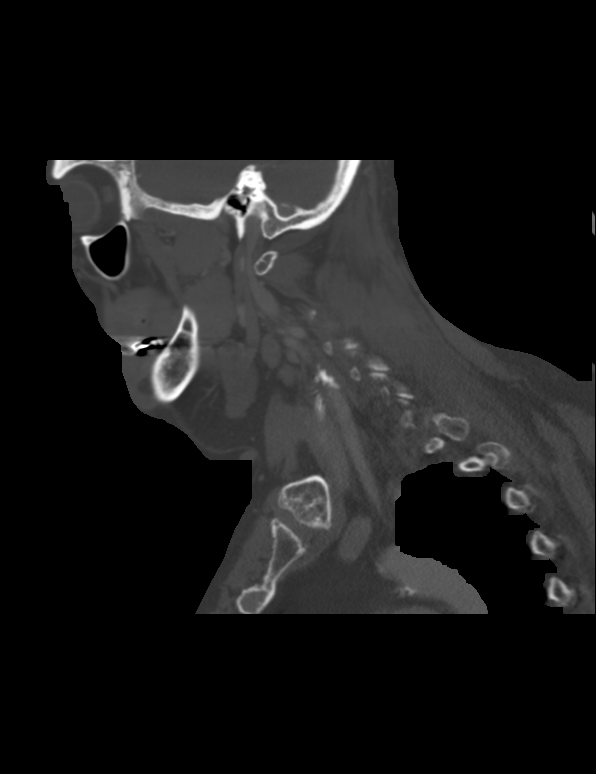

[10 of 33 positions shown; findings below may reference images not displayed]

FINDINGS: Pharynx and larynx: Unremarkable

Salivary glands: Indistinct mass in the left parotid gland
posteriorly, proximally 2.9 by 1.6 cm on image 35 series 201.
Adjacent inflammatory stranding in the subcutaneous tissues and
tracking along the left platysma muscle.

Thyroid: Unremarkable

Lymph nodes: No pathologically enlarged nodes seen.

Vascular: Aortic arch and branch vessel atherosclerotic
calcification including the common carotid arteries. There is
atherosclerotic calcification of the cavernous carotid arteries
bilaterally.

Limited intracranial: Unremarkable

Visualized orbits: Unremarkable

Mastoids and visualized paranasal sinuses: New mild chronic ethmoid
and right maxillary sinusitis.

Skeleton: Considerable cervical spondylosis and degenerative disc
disease causing multilevel prominent foraminal impingement and
central narrowing of the thecal sac at C4-5. Note is also made of a
small lipoma along the posterior neck musculature to the right of
midline on image 39 of series 201, measuring 1.8 cm in diameter.

Upper chest: Unremarkable
IMPRESSION: 1. 2.9 by 1.6 cm left parotid mass, about the same as on prior
imaging. This mass was biopsied on 06/16/2014, revealing Warthin's
tumor. There is some adjacent inflammatory stranding in the vicinity
of the left parotid and especially in the overlying subcutaneous
tissues, with slight thickening of the platysma muscle, but no
findings for abscess at no over reactive adenopathy. Presumably the
subcutaneous stranding appearance could be due to low-grade
infection such as cellulitis.

## 2015-08-04 DIAGNOSIS — L570 Actinic keratosis: Secondary | ICD-10-CM | POA: Diagnosis not present

## 2015-08-04 DIAGNOSIS — X32XXXD Exposure to sunlight, subsequent encounter: Secondary | ICD-10-CM | POA: Diagnosis not present

## 2015-08-04 DIAGNOSIS — Z1283 Encounter for screening for malignant neoplasm of skin: Secondary | ICD-10-CM | POA: Diagnosis not present

## 2015-08-04 IMAGING — CR DG CHEST 2V
2 series · 2 of 2 positions shown · non-contrast
Comparison: Portable view 08/31/2012

CLINICAL DATA: Chest pain and shortness of breath for 2 days.

EXAM:
CHEST  2 VIEW

[chest pa]
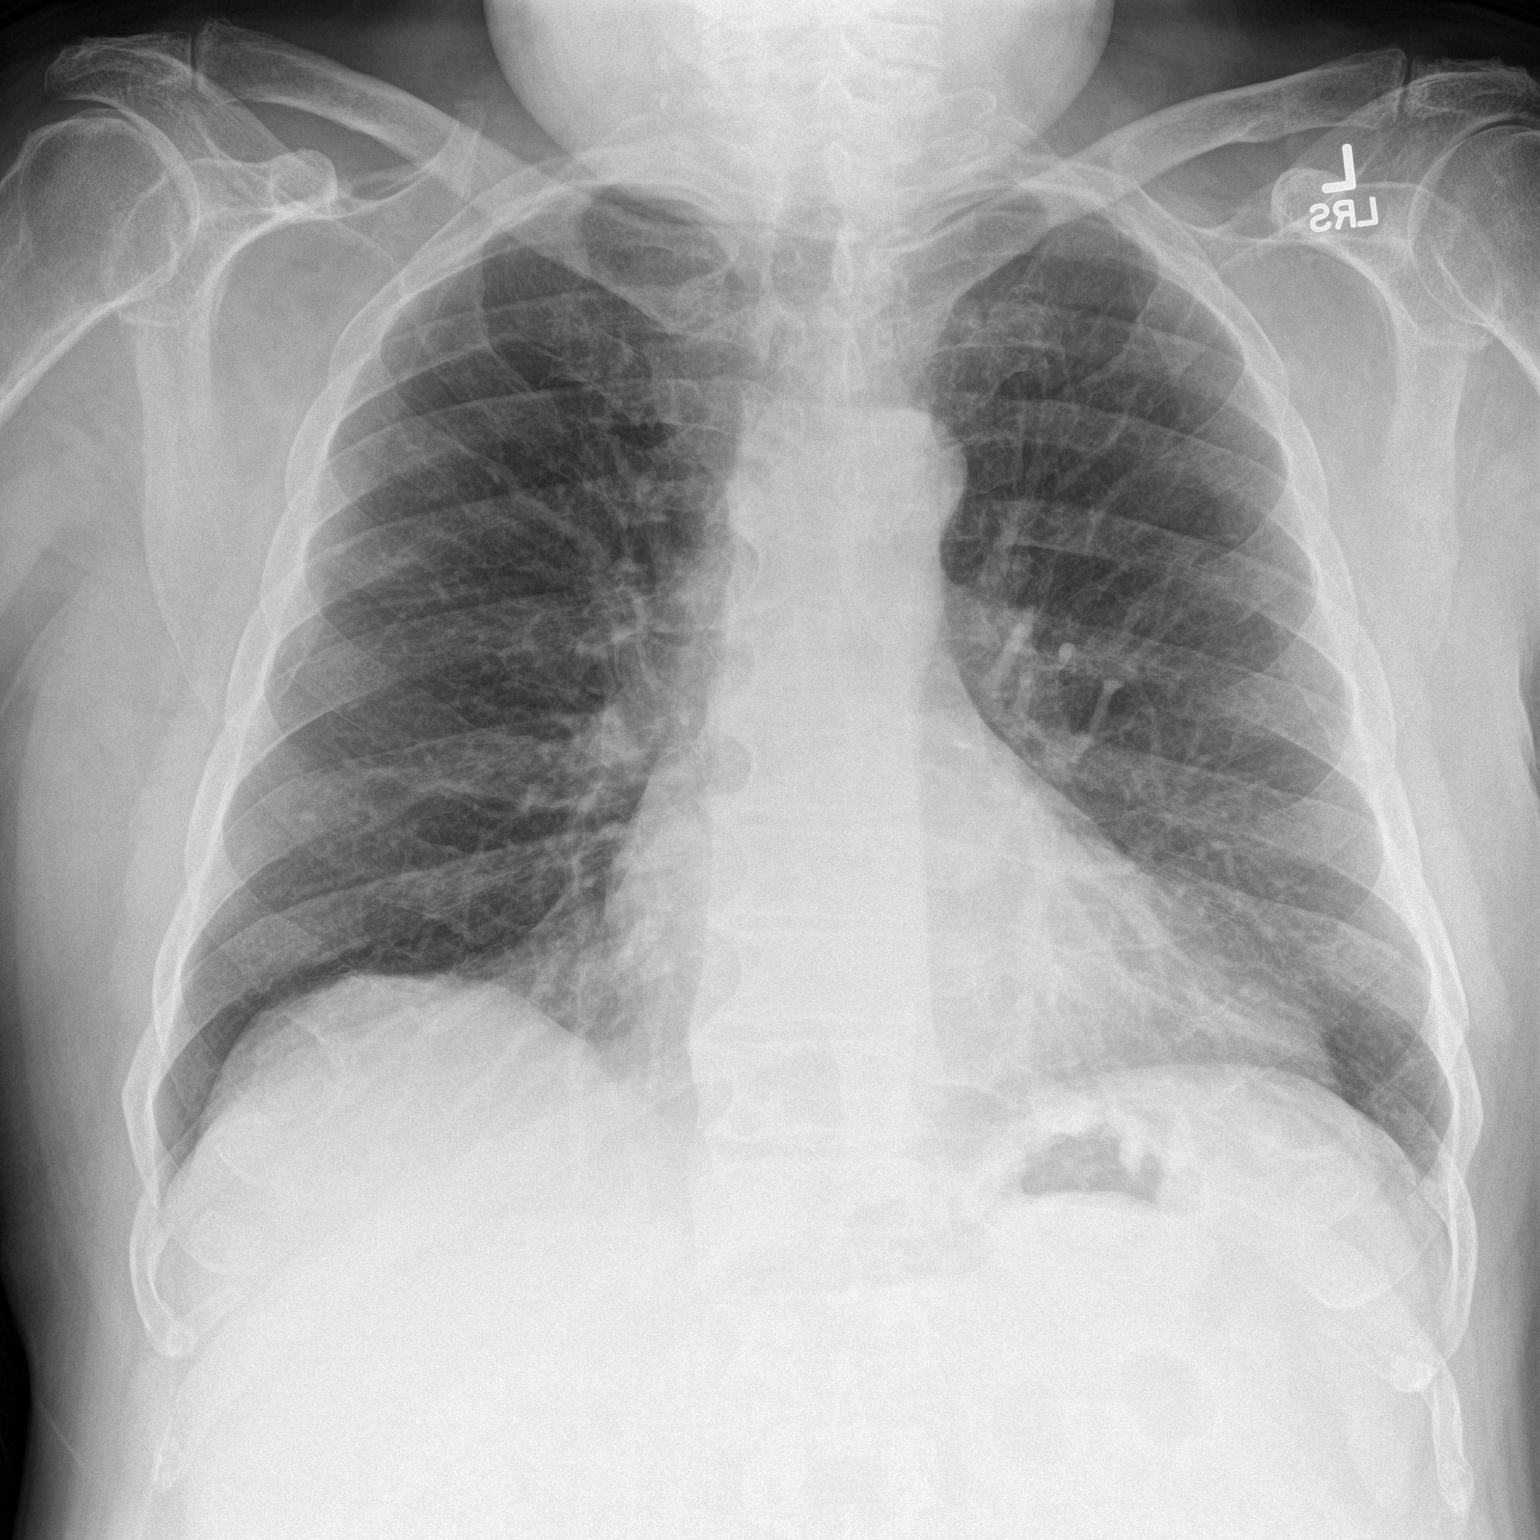

[chest lat]
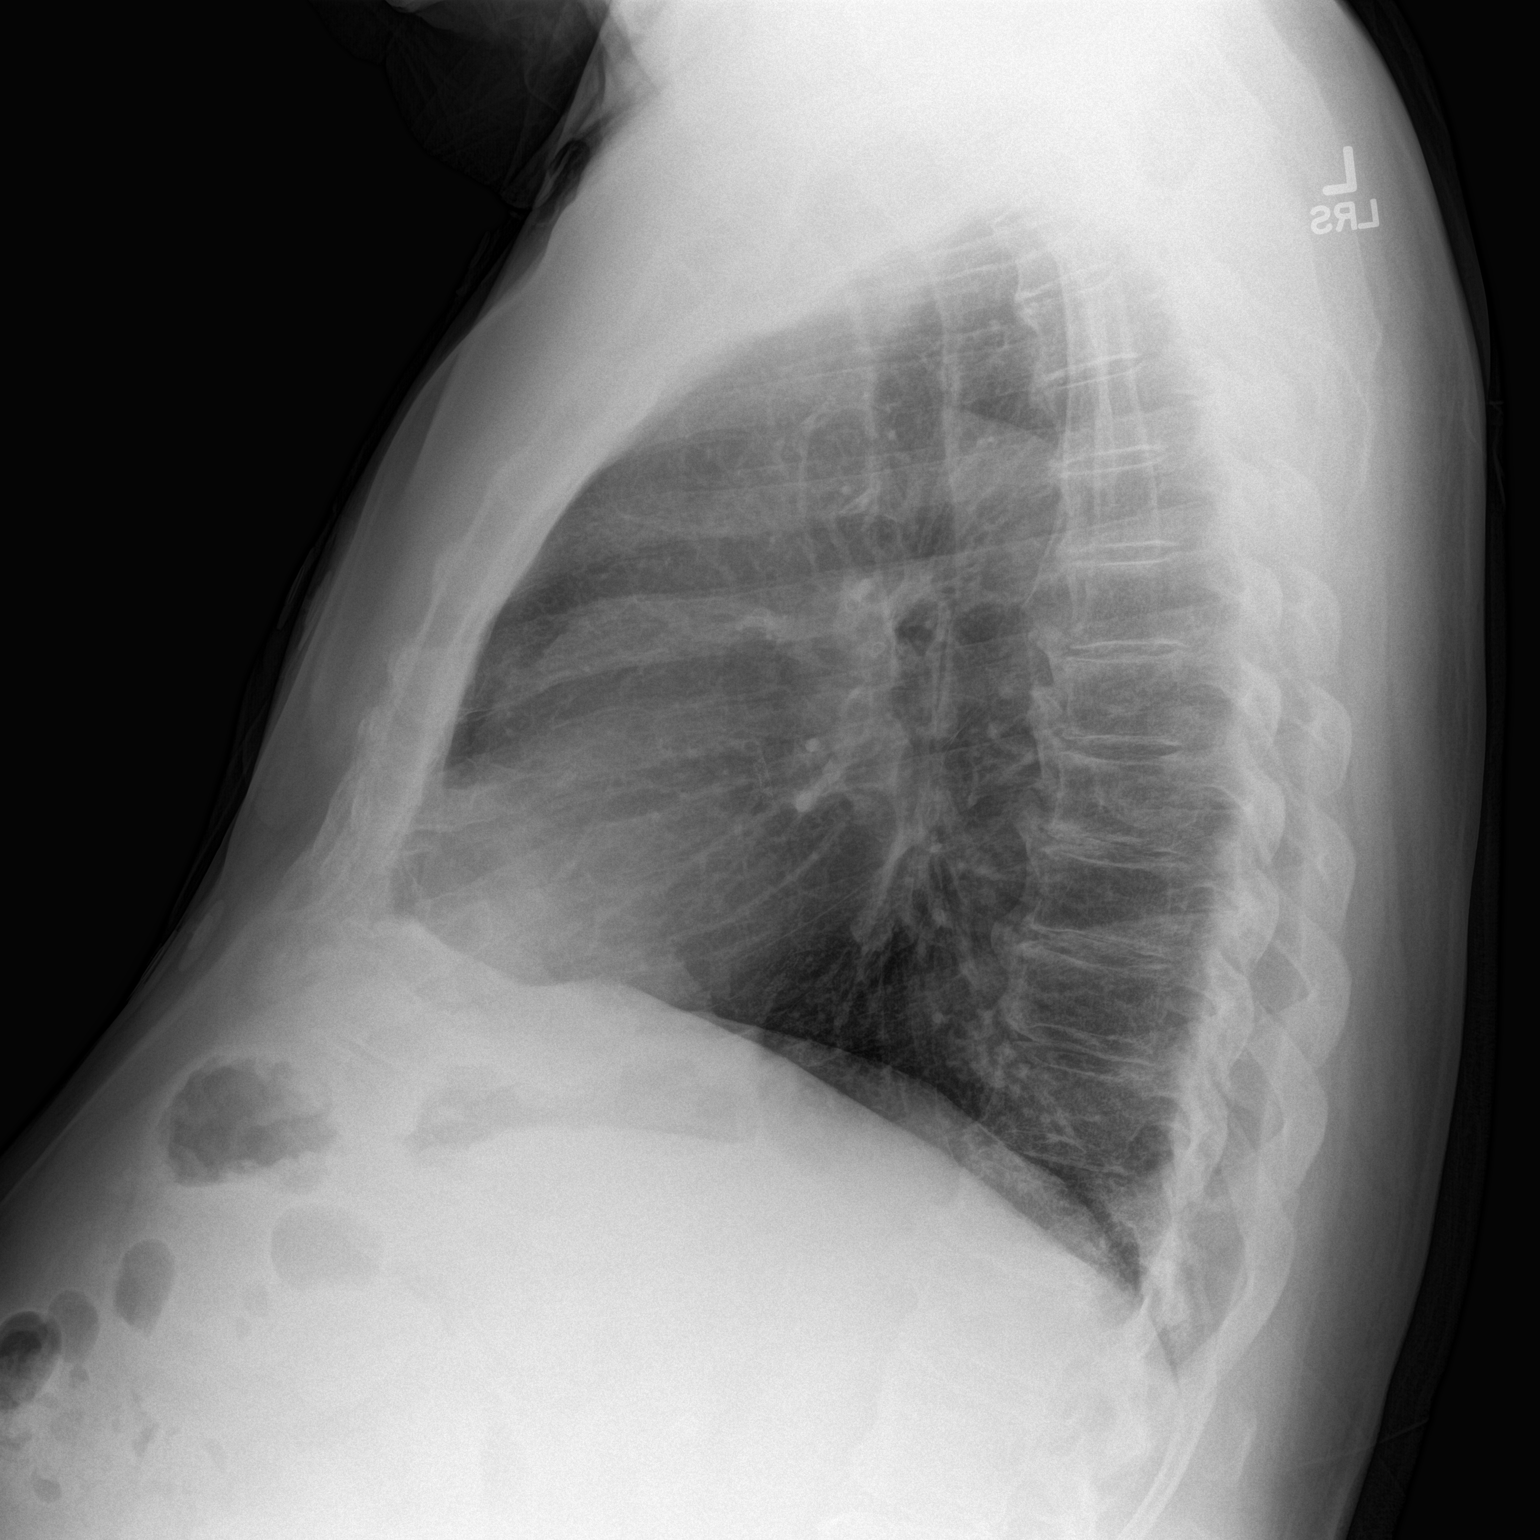

[2 of 2 positions shown; findings below may reference images not displayed]

FINDINGS: Mild hyperinflation, coarse interstitial markings. The heart is at
the upper limits of normal in size. Mediastinal contours are normal.
Pulmonary vasculature is normal. No consolidation, pleural effusion,
or pneumothorax. No acute osseous abnormalities are seen.
IMPRESSION: Mild hyperinflation and coarse interstitial markings, may reflect
bronchitis. No evidence of pneumonia.

## 2015-08-11 DIAGNOSIS — E1140 Type 2 diabetes mellitus with diabetic neuropathy, unspecified: Secondary | ICD-10-CM | POA: Diagnosis not present

## 2015-08-11 DIAGNOSIS — Z7984 Long term (current) use of oral hypoglycemic drugs: Secondary | ICD-10-CM | POA: Diagnosis not present

## 2015-08-11 DIAGNOSIS — R109 Unspecified abdominal pain: Secondary | ICD-10-CM | POA: Diagnosis not present

## 2015-09-27 DIAGNOSIS — R229 Localized swelling, mass and lump, unspecified: Secondary | ICD-10-CM | POA: Diagnosis not present

## 2015-10-20 DIAGNOSIS — M65332 Trigger finger, left middle finger: Secondary | ICD-10-CM | POA: Diagnosis not present

## 2015-10-21 ENCOUNTER — Other Ambulatory Visit: Payer: Self-pay | Admitting: Family Medicine

## 2015-10-21 DIAGNOSIS — D170 Benign lipomatous neoplasm of skin and subcutaneous tissue of head, face and neck: Secondary | ICD-10-CM | POA: Diagnosis not present

## 2015-10-21 DIAGNOSIS — R251 Tremor, unspecified: Secondary | ICD-10-CM | POA: Diagnosis not present

## 2015-11-23 DIAGNOSIS — M65332 Trigger finger, left middle finger: Secondary | ICD-10-CM | POA: Diagnosis not present

## 2015-12-17 ENCOUNTER — Other Ambulatory Visit: Payer: Self-pay

## 2015-12-17 ENCOUNTER — Other Ambulatory Visit: Payer: Self-pay | Admitting: Orthopedic Surgery

## 2015-12-17 ENCOUNTER — Encounter (HOSPITAL_BASED_OUTPATIENT_CLINIC_OR_DEPARTMENT_OTHER): Payer: Self-pay | Admitting: *Deleted

## 2015-12-17 ENCOUNTER — Encounter (HOSPITAL_BASED_OUTPATIENT_CLINIC_OR_DEPARTMENT_OTHER)
Admission: RE | Admit: 2015-12-17 | Discharge: 2015-12-17 | Disposition: A | Discharge status: Home / Self Care | Payer: Medicare Other | Source: Ambulatory Visit | Attending: Orthopedic Surgery | Admitting: Orthopedic Surgery

## 2015-12-17 DIAGNOSIS — I2510 Atherosclerotic heart disease of native coronary artery without angina pectoris: Secondary | ICD-10-CM | POA: Insufficient documentation

## 2015-12-17 DIAGNOSIS — Z7984 Long term (current) use of oral hypoglycemic drugs: Secondary | ICD-10-CM | POA: Diagnosis not present

## 2015-12-17 DIAGNOSIS — Z955 Presence of coronary angioplasty implant and graft: Secondary | ICD-10-CM | POA: Insufficient documentation

## 2015-12-17 DIAGNOSIS — I10 Essential (primary) hypertension: Secondary | ICD-10-CM | POA: Insufficient documentation

## 2015-12-17 DIAGNOSIS — Z87891 Personal history of nicotine dependence: Secondary | ICD-10-CM | POA: Diagnosis not present

## 2015-12-17 DIAGNOSIS — K219 Gastro-esophageal reflux disease without esophagitis: Secondary | ICD-10-CM | POA: Diagnosis not present

## 2015-12-17 DIAGNOSIS — E119 Type 2 diabetes mellitus without complications: Secondary | ICD-10-CM | POA: Diagnosis not present

## 2015-12-17 DIAGNOSIS — F419 Anxiety disorder, unspecified: Secondary | ICD-10-CM | POA: Diagnosis not present

## 2015-12-17 DIAGNOSIS — N400 Enlarged prostate without lower urinary tract symptoms: Secondary | ICD-10-CM | POA: Diagnosis not present

## 2015-12-17 DIAGNOSIS — J449 Chronic obstructive pulmonary disease, unspecified: Secondary | ICD-10-CM | POA: Diagnosis not present

## 2015-12-17 DIAGNOSIS — M65842 Other synovitis and tenosynovitis, left hand: Secondary | ICD-10-CM | POA: Diagnosis not present

## 2015-12-17 DIAGNOSIS — E785 Hyperlipidemia, unspecified: Secondary | ICD-10-CM | POA: Diagnosis not present

## 2015-12-17 LAB — BASIC METABOLIC PANEL
Anion gap: 7 (ref 5–15)
BUN: 29 mg/dL — AB (ref 6–20)
CALCIUM: 9.2 mg/dL (ref 8.9–10.3)
CHLORIDE: 108 mmol/L (ref 101–111)
CO2: 24 mmol/L (ref 22–32)
CREATININE: 1.69 mg/dL — AB (ref 0.61–1.24)
GFR, EST AFRICAN AMERICAN: 43 mL/min — AB (ref >60)
GFR, EST NON AFRICAN AMERICAN: 37 mL/min — AB (ref >60)
Glucose, Bld: 157 mg/dL — ABNORMAL HIGH (ref 65–99)
Potassium: 4.9 mmol/L (ref 3.5–5.1)
SODIUM: 139 mmol/L (ref 135–145)

## 2015-12-17 NOTE — Progress Notes (Signed)
   12/17/15 1120  OBSTRUCTIVE SLEEP APNEA  Have you ever been diagnosed with sleep apnea through a sleep study? No  Do you snore loudly (loud enough to be heard through closed doors)?  1  Do you often feel tired, fatigued, or sleepy during the daytime (such as falling asleep during driving or talking to someone)? 0  Has anyone observed you stop breathing during your sleep? 1  Do you have, or are you being treated for high blood pressure? 1  BMI more than 35 kg/m2? 0  Age > 50 (1-yes) 1  Neck circumference greater than:Male 16 inches or larger, Male 17inches or larger? 0  Male Gender (Yes=1) 1  Obstructive Sleep Apnea Score 5

## 2015-12-20 ENCOUNTER — Other Ambulatory Visit: Payer: Self-pay | Admitting: Orthopedic Surgery

## 2015-12-21 ENCOUNTER — Encounter (HOSPITAL_BASED_OUTPATIENT_CLINIC_OR_DEPARTMENT_OTHER)
Admission: RE | Disposition: A | Discharge status: Home / Self Care | Payer: Self-pay | Source: Ambulatory Visit | Attending: Orthopedic Surgery

## 2015-12-21 ENCOUNTER — Ambulatory Visit (HOSPITAL_BASED_OUTPATIENT_CLINIC_OR_DEPARTMENT_OTHER): Payer: Medicare Other | Admitting: Anesthesiology

## 2015-12-21 ENCOUNTER — Ambulatory Visit (HOSPITAL_BASED_OUTPATIENT_CLINIC_OR_DEPARTMENT_OTHER)
Admission: RE | Admit: 2015-12-21 | Discharge: 2015-12-21 | Disposition: A | Discharge status: Home / Self Care | Payer: Medicare Other | Source: Ambulatory Visit | Attending: Orthopedic Surgery | Admitting: Orthopedic Surgery

## 2015-12-21 ENCOUNTER — Encounter (HOSPITAL_BASED_OUTPATIENT_CLINIC_OR_DEPARTMENT_OTHER): Payer: Self-pay

## 2015-12-21 DIAGNOSIS — J449 Chronic obstructive pulmonary disease, unspecified: Secondary | ICD-10-CM | POA: Insufficient documentation

## 2015-12-21 DIAGNOSIS — N400 Enlarged prostate without lower urinary tract symptoms: Secondary | ICD-10-CM | POA: Insufficient documentation

## 2015-12-21 DIAGNOSIS — K219 Gastro-esophageal reflux disease without esophagitis: Secondary | ICD-10-CM | POA: Diagnosis not present

## 2015-12-21 DIAGNOSIS — I2510 Atherosclerotic heart disease of native coronary artery without angina pectoris: Secondary | ICD-10-CM | POA: Insufficient documentation

## 2015-12-21 DIAGNOSIS — F419 Anxiety disorder, unspecified: Secondary | ICD-10-CM | POA: Insufficient documentation

## 2015-12-21 DIAGNOSIS — E119 Type 2 diabetes mellitus without complications: Secondary | ICD-10-CM | POA: Diagnosis not present

## 2015-12-21 DIAGNOSIS — K449 Diaphragmatic hernia without obstruction or gangrene: Secondary | ICD-10-CM | POA: Diagnosis not present

## 2015-12-21 DIAGNOSIS — E785 Hyperlipidemia, unspecified: Secondary | ICD-10-CM | POA: Insufficient documentation

## 2015-12-21 DIAGNOSIS — Z7984 Long term (current) use of oral hypoglycemic drugs: Secondary | ICD-10-CM | POA: Insufficient documentation

## 2015-12-21 DIAGNOSIS — M65842 Other synovitis and tenosynovitis, left hand: Secondary | ICD-10-CM | POA: Insufficient documentation

## 2015-12-21 DIAGNOSIS — M65332 Trigger finger, left middle finger: Secondary | ICD-10-CM | POA: Diagnosis not present

## 2015-12-21 DIAGNOSIS — I129 Hypertensive chronic kidney disease with stage 1 through stage 4 chronic kidney disease, or unspecified chronic kidney disease: Secondary | ICD-10-CM | POA: Diagnosis not present

## 2015-12-21 DIAGNOSIS — Z87891 Personal history of nicotine dependence: Secondary | ICD-10-CM | POA: Diagnosis not present

## 2015-12-21 DIAGNOSIS — R0602 Shortness of breath: Secondary | ICD-10-CM | POA: Diagnosis not present

## 2015-12-21 DIAGNOSIS — I10 Essential (primary) hypertension: Secondary | ICD-10-CM | POA: Diagnosis not present

## 2015-12-21 HISTORY — PX: TRIGGER FINGER RELEASE: SHX641

## 2015-12-21 SURGERY — RELEASE, A1 PULLEY, FOR TRIGGER FINGER
Anesthesia: Monitor Anesthesia Care | Site: Finger | Laterality: Left

## 2015-12-21 MED ORDER — BUPIVACAINE-EPINEPHRINE 0.5% -1:200000 IJ SOLN
INTRAMUSCULAR | Status: DC | PRN
  Administered 2015-12-21: 3 mL

## 2015-12-21 MED ORDER — FENTANYL CITRATE (PF) 100 MCG/2ML IJ SOLN
25.0000 ug | INTRAMUSCULAR | Status: DC | PRN
Start: 2015-12-21 — End: 2015-12-21

## 2015-12-21 MED ORDER — LACTATED RINGERS IV SOLN
INTRAVENOUS | Status: DC
  Administered 2015-12-21: 14:00:00 via INTRAVENOUS

## 2015-12-21 MED ORDER — SUCCINYLCHOLINE CHLORIDE 200 MG/10ML IV SOSY
PREFILLED_SYRINGE | INTRAVENOUS | Status: AC
  Filled 2015-12-21: qty 10

## 2015-12-21 MED ORDER — CEFAZOLIN SODIUM-DEXTROSE 2-4 GM/100ML-% IV SOLN
2.0000 g | INTRAVENOUS | Status: AC
  Administered 2015-12-21: 2 g via INTRAVENOUS

## 2015-12-21 MED ORDER — FENTANYL CITRATE (PF) 100 MCG/2ML IJ SOLN: 25.0000 ug | INTRAMUSCULAR | Status: DC | PRN

## 2015-12-21 MED ORDER — FENTANYL CITRATE (PF) 100 MCG/2ML IJ SOLN
INTRAMUSCULAR | Status: AC
  Filled 2015-12-21: qty 2

## 2015-12-21 MED ORDER — EPHEDRINE 5 MG/ML INJ
INTRAVENOUS | Status: AC
  Filled 2015-12-21: qty 10

## 2015-12-21 MED ORDER — LACTATED RINGERS IV SOLN: INTRAVENOUS | Status: DC

## 2015-12-21 MED ORDER — LIDOCAINE HCL 2 % IJ SOLN
INTRAMUSCULAR | Status: DC | PRN
  Administered 2015-12-21: 3 mL

## 2015-12-21 MED ORDER — FENTANYL CITRATE (PF) 100 MCG/2ML IJ SOLN
50.0000 ug | INTRAMUSCULAR | Status: DC | PRN
  Administered 2015-12-21 (×2): 50 ug via INTRAVENOUS

## 2015-12-21 MED ORDER — CEFAZOLIN SODIUM-DEXTROSE 2-4 GM/100ML-% IV SOLN
INTRAVENOUS | Status: AC
  Filled 2015-12-21: qty 100

## 2015-12-21 MED ORDER — ONDANSETRON HCL 4 MG/2ML IJ SOLN
INTRAMUSCULAR | Status: DC | PRN
  Administered 2015-12-21: 4 mg via INTRAVENOUS

## 2015-12-21 MED ORDER — MIDAZOLAM HCL 2 MG/2ML IJ SOLN: 1.0000 mg | INTRAMUSCULAR | Status: DC | PRN

## 2015-12-21 MED ORDER — ONDANSETRON HCL 4 MG/2ML IJ SOLN
INTRAMUSCULAR | Status: AC
  Filled 2015-12-21: qty 2

## 2015-12-21 MED ORDER — ONDANSETRON HCL 4 MG/2ML IJ SOLN: 4.0000 mg | Freq: Once | INTRAMUSCULAR | Status: DC | PRN

## 2015-12-21 MED ORDER — BUPIVACAINE-EPINEPHRINE (PF) 0.5% -1:200000 IJ SOLN
INTRAMUSCULAR | Status: AC
  Filled 2015-12-21: qty 30

## 2015-12-21 MED ORDER — PROPOFOL 10 MG/ML IV BOLUS
INTRAVENOUS | Status: DC | PRN
  Administered 2015-12-21 (×2): 20 mg via INTRAVENOUS

## 2015-12-21 MED ORDER — LIDOCAINE 2% (20 MG/ML) 5 ML SYRINGE
INTRAMUSCULAR | Status: AC
  Filled 2015-12-21: qty 5

## 2015-12-21 MED ORDER — PHENYLEPHRINE 40 MCG/ML (10ML) SYRINGE FOR IV PUSH (FOR BLOOD PRESSURE SUPPORT)
PREFILLED_SYRINGE | INTRAVENOUS | Status: AC
  Filled 2015-12-21: qty 10

## 2015-12-21 MED ORDER — SCOPOLAMINE 1 MG/3DAYS TD PT72: 1.0000 | MEDICATED_PATCH | Freq: Once | TRANSDERMAL | Status: DC | PRN

## 2015-12-21 MED ORDER — GLYCOPYRROLATE 0.2 MG/ML IJ SOLN: 0.2000 mg | Freq: Once | INTRAMUSCULAR | Status: DC | PRN

## 2015-12-21 SURGICAL SUPPLY — 38 items
BLADE SURG 15 STRL LF DISP TIS (BLADE) ×1 IMPLANT
BLADE SURG 15 STRL SS (BLADE) ×3
BNDG CMPR 9X4 STRL LF SNTH (GAUZE/BANDAGES/DRESSINGS) ×1
BNDG COHESIVE 1X5 TAN STRL LF (GAUZE/BANDAGES/DRESSINGS) ×3 IMPLANT
BNDG CONFORM 2 STRL LF (GAUZE/BANDAGES/DRESSINGS) ×3 IMPLANT
BNDG ESMARK 4X9 LF (GAUZE/BANDAGES/DRESSINGS) ×3 IMPLANT
BNDG GAUZE ELAST 4 BULKY (GAUZE/BANDAGES/DRESSINGS) ×1 IMPLANT
CHLORAPREP W/TINT 26ML (MISCELLANEOUS) ×3 IMPLANT
COVER BACK TABLE 60X90IN (DRAPES) ×3 IMPLANT
COVER MAYO STAND STRL (DRAPES) ×3 IMPLANT
CUFF TOURNIQUET SINGLE 18IN (TOURNIQUET CUFF) ×2 IMPLANT
DRAPE EXTREMITY T 121X128X90 (DRAPE) ×3 IMPLANT
DRAPE SURG 17X23 STRL (DRAPES) ×3 IMPLANT
DRSG EMULSION OIL 3X3 NADH (GAUZE/BANDAGES/DRESSINGS) ×3 IMPLANT
GLOVE BIO SURGEON STRL SZ7.5 (GLOVE) ×3 IMPLANT
GLOVE BIOGEL PI IND STRL 7.0 (GLOVE) ×1 IMPLANT
GLOVE BIOGEL PI IND STRL 8 (GLOVE) ×1 IMPLANT
GLOVE BIOGEL PI INDICATOR 7.0 (GLOVE) ×6
GLOVE BIOGEL PI INDICATOR 8 (GLOVE) ×2
GLOVE ECLIPSE 6.5 STRL STRAW (GLOVE) ×3 IMPLANT
GOWN STRL REUS W/ TWL LRG LVL3 (GOWN DISPOSABLE) ×2 IMPLANT
GOWN STRL REUS W/TWL LRG LVL3 (GOWN DISPOSABLE) ×6
GOWN STRL REUS W/TWL XL LVL3 (GOWN DISPOSABLE) ×3 IMPLANT
NDL HYPO 25X1 1.5 SAFETY (NEEDLE) IMPLANT
NDL SAFETY ECLIPSE 18X1.5 (NEEDLE) IMPLANT
NEEDLE HYPO 18GX1.5 SHARP (NEEDLE)
NEEDLE HYPO 25X1 1.5 SAFETY (NEEDLE) ×3 IMPLANT
NS IRRIG 1000ML POUR BTL (IV SOLUTION) ×3 IMPLANT
PACK BASIN DAY SURGERY FS (CUSTOM PROCEDURE TRAY) ×3 IMPLANT
SPONGE GAUZE 4X4 12PLY STER LF (GAUZE/BANDAGES/DRESSINGS) ×3 IMPLANT
STOCKINETTE 6  STRL (DRAPES) ×2
STOCKINETTE 6 STRL (DRAPES) ×1 IMPLANT
SUT VICRYL RAPIDE 4/0 PS 2 (SUTURE) ×3 IMPLANT
SYR BULB 3OZ (MISCELLANEOUS) ×3 IMPLANT
SYRINGE 10CC LL (SYRINGE) ×2 IMPLANT
TOWEL OR 17X24 6PK STRL BLUE (TOWEL DISPOSABLE) ×3 IMPLANT
TOWEL OR NON WOVEN STRL DISP B (DISPOSABLE) ×2 IMPLANT
UNDERPAD 30X30 (UNDERPADS AND DIAPERS) ×3 IMPLANT

## 2015-12-21 NOTE — Anesthesia Postprocedure Evaluation (Signed)
Anesthesia Post Note  Patient: Marco Acevedo  Procedure(s) Performed: Procedure(s) (LRB): LEFT LONG FINGER TRIGGER RELEASE  (Left)  Patient location during evaluation: PACU Anesthesia Type: MAC Level of consciousness: awake and alert Pain management: pain level controlled Vital Signs Assessment: post-procedure vital signs reviewed and stable Respiratory status: spontaneous breathing, nonlabored ventilation, respiratory function stable and patient connected to nasal cannula oxygen Cardiovascular status: stable and blood pressure returned to baseline Anesthetic complications: no    Last Vitals:  Vitals:   12/21/15 1530 12/21/15 1600  BP: 135/70 136/78  Pulse: 72 76  Resp: 18 20  Temp:  36.7 C    Last Pain:  Vitals:   12/21/15 1600  TempSrc:   PainSc: 0-No pain                 Catalina Gravel

## 2015-12-21 NOTE — H&P (Signed)
Marco Acevedo is an 81 y.o. male.   CC / Reason for Visit: Left long finger problem HPI: This patient returns to clinic today indicating that he is no longer in any pain and that the injection was quite successful.  While we are discussing how the patient was feeling, the patient demonstrates full flexion and the finger triggers.  The patient reports that at this time he does not wish to proceed with any type of surgical release, but at the first incidence of pain, he plans to call and schedule for trigger finger release.    HPI 10/20/2015:This patient returns for reevaluation, having undergone a left long finger trigger injection on 06-02-15.  He reports that he got better for 3 or 4 days, but never fully resolved.  He got well enough that didn't bother him enough to come back at his one month follow-up visit.  It now for the last 2-3 months he reports increasing pain again with diminished long finger motion  HPI 06-02-15: This patient is a 81 year old, right-hand-dominant, male who indicates that he has pain over the palmar aspect of the MP joint in his left long finger.  He indicates the morning is worse than other times and that it pops and snaps when he tries to bend it.  He indicates that he is unable to take over-the-counter type NSAIDs as he has a history of having ulcers.  He arrives today for further evaluation and treatment and is actually hoping for an injection.  The patient is a type II diabetic.  Past Medical History:  Diagnosis Date  . BPH (benign prostatic hyperplasia)   . Bruises easily   . CAD (coronary artery disease)    no prior MI, PCI x 2 in 2009 and 02/2011 in [place]  . Claustrophobia   . COPD (chronic obstructive pulmonary disease) ([place])   . Diabetes mellitus type II    type 2  . Diarrhea 2015   had for a year and a half  . GERD (gastroesophageal reflux disease)   . Gout   . Granuloma annulare   . History of kidney stones   . Hyperlipidemia   . Hypertension   .  Shortness of breath dyspnea    with exertion  . Wears dentures    top  . Wears glasses     Past Surgical History:  Procedure Laterality Date  . BLEPHAROPLASTY    . CARDIAC CATHETERIZATION  [phone]   X 2 stents  . CHOLECYSTECTOMY  09/23/2014  . CHOLECYSTECTOMY N/A 09/23/2014   Procedure: LAPAROSCOPIC CHOLECYSTECTOMY WITH INTRAOPERATIVE CHOLANGIOGRAM;  Surgeon: Autumn Messing [name], MD;  Location: [facility];  Service: General;  Laterality: N/A;  . COLONOSCOPY    . EYE SURGERY     both cataracts  . KNEE ARTHROSCOPY WITH MEDIAL MENISECTOMY Right 08/19/2013   Procedure: RIGHT KNEE ARTHROSCOPY WITH PARTIAL MEDIAL MENISECTOMY AND CHONDROPLASTY;  Surgeon: Hessie Dibble, MD;  Location: [place];  Service: Orthopedics;  Laterality: Right;  . LEFT HEART CATHETERIZATION WITH CORONARY ANGIOGRAM N/A 10/03/2011   Procedure: LEFT HEART CATHETERIZATION WITH CORONARY ANGIOGRAM;  Surgeon: Sinclair Grooms, MD;  Location: [facility] CATH LAB;  Service: Cardiovascular;  Laterality: N/A;  . LEFT HEART CATHETERIZATION WITH CORONARY ANGIOGRAM N/A 09/12/2012   Procedure: LEFT HEART CATHETERIZATION WITH CORONARY ANGIOGRAM;  Surgeon: Sinclair Grooms, MD;  Location: [facility] CATH LAB;  Service: Cardiovascular;  Laterality: N/A;  . LIPOMA EXCISION     Biospy only; left parotid gland  .  none    . PERCUTANEOUS CORONARY STENT INTERVENTION (PCI-S) N/A 09/17/2012   Procedure: PERCUTANEOUS CORONARY STENT INTERVENTION (PCI-S);  Surgeon: Sinclair Grooms, MD;  Location: [facility] CATH LAB;  Service: Cardiovascular;  Laterality: N/A;    Family History  Problem Relation Age of Onset  . Aortic aneurysm Father   . Aneurysm Mother     brain  . Cancer Brother   . COPD Sister   . Other Sister     health hx unknown  . Other Sister     health hx unknown  . Other Brother     health hx unknown   Social History:  reports that he quit smoking about 4 years ago. His smoking use included Cigarettes. He has a 64.00 pack-year smoking  history. He has never used smokeless tobacco. He reports that he drinks alcohol. He reports that he does not use drugs.  Allergies: No Known Allergies  No prescriptions prior to admission.    No results found for this or any previous visit (from the past 48 hour(s)). No results found.  Review of Systems  All other systems reviewed and are negative.   Height 5\' 9"  (1.753 m), weight 97.5 kg (215 lb). Physical Exam  Constitutional:  WD, WN, NAD HEENT:  NCAT, EOMI Neuro/Psych:  Alert & oriented to person, place, and time; appropriate mood & affect Lymphatic: No generalized UE edema or lymphadenopathy Extremities / MSK:  Both UE are normal with respect to appearance, ranges of motion, joint stability, muscle strength/tone, sensation, & perfusion except as otherwise noted:  The left long finger A1 pulley is not painful to palpation.  There is active locking catching with each flexion cycle which is also inducible with palpation.  The patient has a slight PIP flexion contracture, but extension is improved.  Labs / X-rays:  No radiographic studies obtained today.  Assessment: Left long finger trigger  Plan:  Having failed nonoperative management, we will plan to proceed with surgical release. The details of the operative procedure were discussed with the patient.  Questions were invited and answered.  In addition to the goal of the procedure, the risks of the procedure to include but not limited to bleeding; infection; damage to the nerves or blood vessels that could result in bleeding, numbness, weakness, chronic pain, and the need for additional procedures; stiffness; the need for revision surgery; and anesthetic risks were reviewed.  No specific outcome was guaranteed or implied.  Informed consent was obtained.  Newell Wafer A., MD 12/21/2015, 9:14 AM

## 2015-12-21 NOTE — Interval H&P Note (Signed)
History and Physical Interval Note:  12/21/2015 1:45 PM  Marco Acevedo  has presented today for surgery, with the diagnosis of LEFT LONG FINGER TRIGGER FINGER  The various methods of treatment have been discussed with the patient and family. After consideration of risks, benefits and other options for treatment, the patient has consented to  Procedure(s): LEFT LONG FINGER TRIGGER RELEASE  (Left) as a surgical intervention .  The patient's history has been reviewed, patient examined, no change in status, stable for surgery.  I have reviewed the patient's chart and labs.  Questions were answered to the patient's satisfaction.     Kent Braunschweig A.

## 2015-12-21 NOTE — Transfer of Care (Signed)
Immediate Anesthesia Transfer of Care Note  Patient: Marco Acevedo  Procedure(s) Performed: Procedure(s): LEFT LONG FINGER TRIGGER RELEASE  (Left)  Patient Location: PACU  Anesthesia Type:MAC  Level of Consciousness: awake and alert   Airway & Oxygen Therapy: Patient Spontanous Breathing and Patient connected to face mask oxygen  Post-op Assessment: Report given to RN and Post -op Vital signs reviewed and stable  Post vital signs: Reviewed and stable  Last Vitals:  Vitals:   12/21/15 1324  BP: 124/61  Pulse: 85  Resp: 20  Temp: 36.5 C    Last Pain:  Vitals:   12/21/15 1324  TempSrc: Oral         Complications: No apparent anesthesia complications

## 2015-12-21 NOTE — Discharge Instructions (Signed)
Discharge Instructions ° ° °You have a light dressing on your hand.  °You may begin gentle motion of your fingers and hand immediately, but you should not do any heavy lifting or gripping.  Elevate your hand to reduce pain & swelling of the digits.  Ice over the operative site may be helpful to reduce pain & swelling.  DO NOT USE HEAT. °Pain medicine has been prescribed for you.  °Use your medicine as needed over the first 48 hours, and then you can begin to taper your use. You may use Tylenol in place of your prescribed pain medication, but not IN ADDITION to it. °Leave the dressing in place until the third day after your surgery and then remove it, leaving it open to air.  °After the bandage has been removed you may shower, regularly washing the incision and letting the water run over it, but not submerging it (no swimming, soaking it in dishwater, etc.) °You may drive a car when you are off of prescription pain medications and can safely control your vehicle with both hands. °We will address whether therapy will be required or not when you return to the office. °You may have already made your follow-up appointment when we completed your preop visit.  If not, please call our office today or the next business day to make your return appointment for 10-15 days after surgery. ° ° °Please call [phone] during normal business hours or [phone] after hours for any problems. Including the following: ° °- excessive redness of the incisions °- drainage for more than 4 days °- fever of more than 101.5 F ° °*Please note that pain medications will not be refilled after hours or on weekends. °Post Anesthesia Home Care Instructions ° °Activity: °Get plenty of rest for the remainder of the day. A responsible adult should stay with you for 24 hours following the procedure.  °For the next 24 hours, DO NOT: °-Drive a car °-Operate machinery °-Drink alcoholic beverages °-Take any medication unless instructed by your  physician °-Make any legal decisions or sign important papers. ° °Meals: °Start with liquid foods such as gelatin or soup. Progress to regular foods as tolerated. Avoid greasy, spicy, heavy foods. If nausea and/or vomiting occur, drink only clear liquids until the nausea and/or vomiting subsides. Call your physician if vomiting continues. ° °Special Instructions/Symptoms: °Your throat may feel dry or sore from the anesthesia or the breathing tube placed in your throat during surgery. If this causes discomfort, gargle with warm salt water. The discomfort should disappear within 24 hours. ° °If you had a scopolamine patch placed behind your ear for the management of post- operative nausea and/or vomiting: ° °1. The medication in the patch is effective for 72 hours, after which it should be removed.  Wrap patch in a tissue and discard in the trash. Wash hands thoroughly with soap and water. °2. You may remove the patch earlier than 72 hours if you experience unpleasant side effects which may include dry mouth, dizziness or visual disturbances. °3. Avoid touching the patch. Wash your hands with soap and water after contact with the patch. °  ° °

## 2015-12-21 NOTE — Anesthesia Preprocedure Evaluation (Addendum)
Anesthesia Evaluation  Patient identified by MRN, date of birth, ID band Patient awake    Reviewed: Allergy & Precautions, NPO status , Patient's Chart, lab work & pertinent test results  Airway Mallampati: II  TM Distance: >3 FB Neck ROM: Full    Dental  (+) Dental Advisory Given, Upper Dentures   Pulmonary shortness of breath and with exertion, sleep apnea , COPD,  COPD inhaler, former smoker,    Pulmonary exam normal breath sounds clear to auscultation       Cardiovascular hypertension, Pt. on medications (-) angina+ CAD (PCI x2) and + Cardiac Stents  (-) Past MI Normal cardiovascular exam Rhythm:Regular Rate:Normal  HLD   Neuro/Psych PSYCHIATRIC DISORDERS Anxiety negative neurological ROS     GI/Hepatic Neg liver ROS, hiatal hernia, GERD  Medicated and Controlled,  Endo/Other  diabetes, Type 2, Oral Hypoglycemic Agents  Renal/GU Renal InsufficiencyRenal disease     Musculoskeletal negative musculoskeletal ROS (+)   Abdominal   Peds  Hematology negative hematology ROS (+)   Anesthesia Other Findings Day of surgery medications reviewed with the patient.  Reproductive/Obstetrics                            Anesthesia Physical Anesthesia Plan  ASA: [name]  Anesthesia Plan: MAC   Post-op Pain Management:    Induction: Intravenous  Airway Management Planned: Nasal Cannula  Additional Equipment:   Intra-op Plan:   Post-operative Plan:   Informed Consent: I have reviewed the patients History and Physical, chart, labs and discussed the procedure including the risks, benefits and alternatives for the proposed anesthesia with the patient or authorized representative who has indicated his/her understanding and acceptance.   Dental advisory given  Plan Discussed with: CRNA and Anesthesiologist  Anesthesia Plan Comments: (Discussed risks/benefits/alternatives to MAC sedation including  need for ventilatory support, hypotension, need for conversion to general anesthesia.  All patient questions answered.  Patient/guardian wishes to proceed.)        Anesthesia Quick Evaluation

## 2015-12-21 NOTE — Op Note (Signed)
12/21/2015  1:45 PM  PATIENT:  Marco Acevedo  81 y.o. male  PRE-OPERATIVE DIAGNOSIS:  L LF stenosing tenosynovitis  POST-OPERATIVE DIAGNOSIS:  Same  PROCEDURE:  Left long finger trigger release  SURGEON: Rayvon Char. Grandville Silos, MD  PHYSICIAN ASSISTANT: Morley Kos, OPA-C  ANESTHESIA:  local and MAC  SPECIMENS:  None  DRAINS:   None  EBL:  less than 50 mL  PREOPERATIVE INDICATIONS:  Marco Acevedo is a  81 y.o. male with history of a L LF trigger digit that has responded only transiently to nonoperative management.  The risks benefits and alternatives were discussed with the patient preoperatively including but not limited to the risks of infection, bleeding, nerve injury, cardiopulmonary complications, the need for revision surgery, among others, and the patient verbalized understanding and consented to proceed.  OPERATIVE IMPLANTS: None  OPERATIVE PROCEDURE:  After receiving prophylactic antibiotics, the patient was escorted to the operative theatre and placed in a supine position.  A surgical "time-out" was performed during which the planned procedure, proposed operative site, and the correct patient identity were compared to the operative consent and agreement confirmed by the circulating nurse according to current facility policy.  The incision was marked and anesthetized with a mixture of lidocaine and Marcaine bearing epinephrine.  Following application of a tourniquet to the operative extremity, the exposed skin was prepped with Chloraprep and draped in the usual sterile fashion.  The limb was exsanguinated with an Esmarch bandage and the tourniquet inflated to approximately 128mmHg higher than systolic BP.  An oblique incision was made over the A1 pulley of the long finger, exploiting the skin creases.  Neurovascular structures were retracted for protection radially and ulnarly.  The A1 pulley was exposed, and split longitudinally in the midline.  Some additional crossing  bands were split proximal to it.  The tendons were inspected and found slightly frayed and with thickened tenosynovium about them.  The tenosynovium was excisionally debrided.  They were then returned to the bed, the wound irrigated, and the tourniquet released.  Additional hemostasis wasn't necessary.  The skin was closed with 4-0 Vicryl Rapide interrupted sutures and a light dressing was applied.  DISPOSITION: He will be discharged home today with typical instructions, returning in 10-15 days.

## 2015-12-22 ENCOUNTER — Encounter (HOSPITAL_BASED_OUTPATIENT_CLINIC_OR_DEPARTMENT_OTHER): Payer: Self-pay | Admitting: Orthopedic Surgery

## 2016-01-04 DIAGNOSIS — M65332 Trigger finger, left middle finger: Secondary | ICD-10-CM | POA: Diagnosis not present

## 2016-01-04 DIAGNOSIS — Z23 Encounter for immunization: Secondary | ICD-10-CM | POA: Diagnosis not present

## 2016-01-14 DIAGNOSIS — R1013 Epigastric pain: Secondary | ICD-10-CM | POA: Diagnosis not present

## 2016-01-14 DIAGNOSIS — Z86010 Personal history of colonic polyps: Secondary | ICD-10-CM | POA: Diagnosis not present

## 2016-01-14 DIAGNOSIS — K219 Gastro-esophageal reflux disease without esophagitis: Secondary | ICD-10-CM | POA: Diagnosis not present

## 2016-01-18 ENCOUNTER — Ambulatory Visit (INDEPENDENT_AMBULATORY_CARE_PROVIDER_SITE_OTHER): Payer: Medicare Other | Admitting: Neurology

## 2016-01-18 ENCOUNTER — Encounter: Payer: Self-pay | Admitting: Neurology

## 2016-01-18 VITALS — BP 152/70 | HR 80 | Resp 18 | Ht 69.0 in | Wt 218.0 lb

## 2016-01-18 DIAGNOSIS — G250 Essential tremor: Secondary | ICD-10-CM | POA: Diagnosis not present

## 2016-01-18 DIAGNOSIS — E0842 Diabetes mellitus due to underlying condition with diabetic polyneuropathy: Secondary | ICD-10-CM

## 2016-01-18 DIAGNOSIS — E669 Obesity, unspecified: Secondary | ICD-10-CM | POA: Diagnosis not present

## 2016-01-18 NOTE — Progress Notes (Signed)
Subjective:    Patient ID: Marco Acevedo is a 81 y.o. male.  HPI     Star Age, MD, PhD [facility] [address], [address] [place], [place] [address]  Dear Dr. Harrington Challenger,   I saw your patient, Marco Acevedo, upon your kind request, in my neurologic clinic today for initial consultation of his tremors. The patient is accompanied by his wife today. As you know, Marco Acevedo is a 81 year old right-handed gentleman with an underlying medical history of diabetes, diabetic neuropathy, hypertension, gout, reflux disease, COPD, prior smoking (quit in 2013), hyperlipidemia, coronary artery disease, status post stent placements in 2007 and 2012, anxiety, and obesity, who reports an at least 5 year history of bilateral upper extremity tremors. Tremor gets worse when he is nervous, stressed, or hungry. Tremor has not progressed very much, is particularly worse when he is trying to hold something. He has spilled food and drinks. He likes to play golf. I reviewed your office note from 10/21/2015, which you kindly included. He has been on primidone 50 mg strength once daily. He drinks alcohol very rarely now, but admits to heavy and daily drinking years ago, while still working. He was in the [employer] business. He does not note a significant change of his tremor with alcohol. He may enjoy a glass of wine once a month.  He has been primidone for about 4-5 years, he was started on it when he was living in [facility]. He is on one pill qHS, and on 1 pill bid, he felt groggy and off balance.   He has a Hx of witnessed apneas, and had a sleep study, which he says was inconclusive, no CPAP therapy recommended from what I understand.  He has diabetic neuropathy. He stopped caffeine several months ago. He also gets more shaky after his spiriva, which he uses in the morning, more so after he uses prn albuterol. Shaky when he has not eaten.  He does not recall any family history of  tremors, he had a total of 3 brothers and 3 sisters. He is the second youngest. Parents died in their early 76s. He does not recall grandparents with tremors. One older brother died at age 79 after an accident, his other brother died at 37, 1 sister died at age 74. He has a 31 year old younger brother and one sister age 60 and 73 sister age 1, neither sibling with tremors.  His Past Medical History Is Significant For: Past Medical History:  Diagnosis Date  . BPH (benign prostatic hyperplasia)   . Bruises easily   . CAD (coronary artery disease)    no prior MI, PCI x 2 in 2009 and 02/2011 in [place]  . Claustrophobia   . COPD (chronic obstructive pulmonary disease) ([place])   . Diabetes mellitus type II    type 2  . Diarrhea 2015   had for a year and a half  . GERD (gastroesophageal reflux disease)   . Gout   . Granuloma annulare   . History of kidney stones   . Hyperlipidemia   . Hypertension   . Shortness of breath dyspnea    with exertion  . Wears dentures    top  . Wears glasses     His Past Surgical History Is Significant For: Past Surgical History:  Procedure Laterality Date  . BLEPHAROPLASTY    . CARDIAC CATHETERIZATION  [phone]   X 2 stents  . CHOLECYSTECTOMY  09/23/2014  . CHOLECYSTECTOMY N/A  09/23/2014   Procedure: LAPAROSCOPIC CHOLECYSTECTOMY WITH INTRAOPERATIVE CHOLANGIOGRAM;  Surgeon: Autumn Messing [name], MD;  Location: [place];  Service: General;  Laterality: N/A;  . COLONOSCOPY    . EYE SURGERY     both cataracts  . KNEE ARTHROSCOPY WITH MEDIAL MENISECTOMY Right 08/19/2013   Procedure: RIGHT KNEE ARTHROSCOPY WITH PARTIAL MEDIAL MENISECTOMY AND CHONDROPLASTY;  Surgeon: Hessie Dibble, MD;  Location: [place];  Service: Orthopedics;  Laterality: Right;  . LEFT HEART CATHETERIZATION WITH CORONARY ANGIOGRAM N/A 10/03/2011   Procedure: LEFT HEART CATHETERIZATION WITH CORONARY ANGIOGRAM;  Surgeon: Sinclair Grooms, MD;  Location: [facility] CATH LAB;  Service:  Cardiovascular;  Laterality: N/A;  . LEFT HEART CATHETERIZATION WITH CORONARY ANGIOGRAM N/A 09/12/2012   Procedure: LEFT HEART CATHETERIZATION WITH CORONARY ANGIOGRAM;  Surgeon: Sinclair Grooms, MD;  Location: [facility] CATH LAB;  Service: Cardiovascular;  Laterality: N/A;  . LIPOMA EXCISION     Biospy only; left parotid gland  . none    . PERCUTANEOUS CORONARY STENT INTERVENTION (PCI-S) N/A 09/17/2012   Procedure: PERCUTANEOUS CORONARY STENT INTERVENTION (PCI-S);  Surgeon: Sinclair Grooms, MD;  Location: [facility] CATH LAB;  Service: Cardiovascular;  Laterality: N/A;  . TRIGGER FINGER RELEASE Left 12/21/2015   Procedure: LEFT LONG FINGER TRIGGER RELEASE ;  Surgeon: Milly Jakob, MD;  Location: [place];  Service: Orthopedics;  Laterality: Left;    His Family History Is Significant For: Family History  Problem Relation Age of Onset  . Aortic aneurysm Father   . Aneurysm Mother     brain  . Cancer Brother   . COPD Sister   . Other Sister     health hx unknown  . Other Sister     health hx unknown  . Other Brother     health hx unknown    His Social History Is Significant For: Social History   Social History  . Marital status: Married    Spouse name: N/A  . Number of children: N/A  . Years of education: N/A   Occupational History  . retired    Social History Main Topics  . Smoking status: Former Smoker    Packs/day: 1.00    Years: 64.00    Types: Cigarettes    Quit date: 05/29/2011  . Smokeless tobacco: Never Used  . Alcohol use Yes     Comment: rarely  . Drug use: No  . Sexual activity: Not Currently   Other Topics Concern  . None   Social History Narrative   Denies Caffeine use     His Allergies Are:  No Known Allergies:   His Current Medications Are:  Outpatient Encounter Prescriptions as of 01/18/2016  Medication Sig  . albuterol (PROVENTIL HFA) 108 (90 BASE) MCG/ACT inhaler Inhale 2 puffs into the lungs every 6 (six) hours as needed for wheezing  or shortness of breath.   [name] albuterol (PROVENTIL) (2.5 MG/3ML) 0.083% nebulizer solution Take 2.5 mg by nebulization every 6 (six) hours as needed for wheezing or shortness of breath.   . allopurinol (ZYLOPRIM) 100 MG tablet Take 100 mg by mouth daily.  [name] aspirin EC 81 MG tablet Take 81 mg by mouth daily.  [name] b complex vitamins capsule Take 1 capsule by mouth daily.  . Ferrous Sulfate (IRON) 325 (65 FE) MG TABS Take 1 tablet by mouth daily.  . finasteride (PROSCAR) 5 MG tablet Take 1 tablet (5 mg total) by mouth daily.  [name] gemfibrozil (LOPID) 600 MG  tablet Take 600 mg by mouth daily.  [name] glyBURIDE (DIABETA) 5 MG tablet Take 5 mg by mouth daily.  [name] guaifenesin (HUMIBID E) 400 MG TABS tablet Take 800 mg by mouth daily.   . isosorbide mononitrate (IMDUR) 120 MG 24 hr tablet Take 1 tablet (120 mg total) by mouth daily.  . metFORMIN (GLUCOPHAGE) 500 MG tablet Take 500 mg by mouth 2 (two) times daily.  . nitroGLYCERIN (NITROSTAT) 0.4 MG SL tablet Place 0.4 mg under the tongue every 5 (five) minutes as needed for chest pain.  [name] omeprazole (PRILOSEC) 20 MG capsule Take 1 capsule (20 mg total) by mouth 2 (two) times daily before a meal.  . pravastatin (PRAVACHOL) 40 MG tablet Take 40 mg by mouth daily.  . primidone (MYSOLINE) 50 MG tablet Take 50 mg by mouth at bedtime.   [name] tiotropium (SPIRIVA) 18 MCG inhalation capsule Place 36 mcg into inhaler and inhale every morning.   . [DISCONTINUED] vitamin B-12 (CYANOCOBALAMIN) 1000 MCG tablet Take 1,000 mcg by mouth daily.   No facility-administered encounter medications on file as of 01/18/2016.   :   Review of Systems:  Out of a complete 14 point review of systems, all are reviewed and negative with the exception of these symptoms as listed below: Review of Systems  Neurological:       Patient reports that he has hand tremors for about 3-4 years. The tremors are worse when he is stressed, hungry, or in a hurry to get somewhere.     Objective:   Neurologic Exam  Physical Exam Physical Examination:   Vitals:   01/18/16 1010  BP: (!) 152/70  Pulse: 80  Resp: 18   General Examination: The patient is a very pleasant 81 y.o. male in no acute distress. He appears well-developed and well-nourished and well groomed.   HEENT: Normocephalic, atraumatic, pupils are equal, round and reactive to light and accommodation. Funduscopic exam is normal with sharp disc margins noted. S/p cataract repairs b/l. Extraocular tracking is good without limitation to gaze excursion or nystagmus noted. Normal smooth pursuit is noted. Hearing is grossly impaired, has hearing aids, but not in place. Face is symmetric with normal facial animation and normal facial sensation. Speech is clear with no dysarthria noted. There is no hypophonia. There is no lip, neck/head, jaw or voice tremor. Neck is supple with full range of passive and active motion. There are no carotid bruits on auscultation. Oropharynx exam reveals: moderate mouth dryness, adequate dental hygiene and moderate airway crowding. Mallampati is class [name]. Tongue protrudes centrally and palate elevates symmetrically. Neck size is larger.   Chest: Clear to auscultation without wheezing, rhonchi or crackles noted.  Heart: S1+S2+0, regular and normal without murmurs, rubs or gallops noted.   Abdomen: Soft, non-tender and non-distended with normal bowel sounds appreciated on auscultation.  Extremities: There is no pitting edema in the distal lower extremities bilaterally. Pedal pulses are intact.  Skin: Warm and dry without trophic changes noted. There are no varicose veins.  Musculoskeletal: exam reveals no obvious joint deformities, tenderness or joint swelling or erythema.   Neurologically:  Mental status: The patient is awake, alert and oriented in all 4 spheres. His immediate and remote memory, attention, language skills and fund of knowledge are appropriate. There is no evidence of aphasia,  agnosia, apraxia or anomia. Speech is clear with normal prosody and enunciation. Thought process is linear. Mood is normal and affect is normal.  Cranial nerves II - XII are as described  above under HEENT exam. In addition: shoulder shrug is normal with equal shoulder height noted. Motor exam: Normal bulk, strength and tone is noted. There is no drift, resting tremor or rebound. On 01/18/2016: On Archimedes spiral drawing he has mild tremulousness noted, handwriting is mildly tremulous and for the most part legible, not micrographic.   Romberg shows mild sway. Reflexes are 1+ in the upper extremities, trace in both knees and absent in the ankles. Babinski: Toes are flexor bilaterally. Fine motor skills and coordination: intact with normal finger taps, normal hand movements, normal rapid alternating patting, normal foot taps and normal foot agility.  Cerebellar testing: No dysmetria or intention tremor on finger to nose testing. Heel to shin is unremarkable bilaterally. There is no truncal or gait ataxia.  Sensory exam: intact to light touch, pinprick, vibration, temperature sense in the upper extremities, with decrease to all modalities noted in the distal lower extremities, up to mid shin areas bilaterally .  Gait, station and balance: He stands easily. No veering to one side is noted. No leaning to one side is noted. Posture is age-appropriate and stance is narrow based. Gait shows normal stride length and normal pace. No problems turning are noted. Tandem walk is difficult for him.               Assessment and Plan:   In summary, Marco Acevedo is a very pleasant 81 y.o.-year old male with an underlying medical history of diabetes, diabetic neuropathy, hypertension, gout, reflux disease, COPD, smoking, hyperlipidemia, coronary artery disease, status post stent placements in 2007 and 2012, anxiety, and obesity, whose history and physical exam are in keeping mild essential tremor. the main differential  diagnosis is enhanced physiologic tremor, particularly in light of late onset tremor, absence of family history, and tremor exacerbation after his inhaler in the morning and with hunger or nervousness. It has not really progressed either. His overall exam is otherwise benign and he is reassured in that regard. Nevertheless, he has not had a brain MRI and we will proceed with one at this time. He does have evidence of diabetic polyneuropathy. He is not particularly bothered by painful neuropathy at this time even though he does have occasional tingling and also numbness, particularly at night. Pain is not a big component at this time however. I suggested a 6 month follow-up, sooner as needed. We talked about potential tremor exacerbation with hypoglycemia, dehydration, sleep deprivation, stress, anxiety etc. He is encouraged to continue with primidone low dose at night. He has tried a higher dose in the past and had side effects.   I suggested a routine 6 month follow-up, sooner as needed. We will be in touch in the interim regarding his brain MRI results. I answered all their questions today and the patient and his wife were in agreement.  Thank you very much for allowing me to participate in the care of this nice patient. If I can be of any further assistance to you please do not hesitate to call me at [phone].  Sincerely,   Star Age, MD, PhD

## 2016-01-18 NOTE — Patient Instructions (Addendum)
Your tremor is mild. Your exam is otherwise benign, you do have signs of diabetic neuropathy.   Please remember, that any kind of tremor may be exacerbated by anxiety, anger, nervousness, excitement, dehydration, sleep deprivation, by caffeine, and low blood sugar values or blood sugar fluctuations. Some medications, especially some antidepressants and lithium can cause or exacerbate tremors. Tremors may temporarily calm down her subside with the use of a benzodiazepine such as Valium or related medications and with alcohol. Be aware however that drinking alcohol is not an approved treatment or appropriate treatment for tremor control and long-term use of benzodiazepines such as Valium, lorazepam, alprazolam, or clonazepam can cause habit formation, physical and psychological addiction.  We will do a brain scan, called MRI and call you with the test results. We will have to schedule you for this on a separate date. This test requires authorization from your insurance, and we will take care of the insurance process.

## 2016-01-28 DIAGNOSIS — L821 Other seborrheic keratosis: Secondary | ICD-10-CM | POA: Diagnosis not present

## 2016-01-28 DIAGNOSIS — L820 Inflamed seborrheic keratosis: Secondary | ICD-10-CM | POA: Diagnosis not present

## 2016-02-01 DIAGNOSIS — M65332 Trigger finger, left middle finger: Secondary | ICD-10-CM | POA: Diagnosis not present

## 2016-02-10 ENCOUNTER — Telehealth: Payer: Self-pay | Admitting: Neurology

## 2016-02-10 MED ORDER — ALPRAZOLAM 0.5 MG PO TABS: 0.5000 mg | ORAL_TABLET | Freq: Every evening | ORAL | 0 refills | Status: DC | PRN

## 2016-02-10 NOTE — Telephone Encounter (Signed)
Pt's wife called to advise the pt is getting anxious about the MRI scheduled on Saturday 11/4. He is asking for something like valium that could possibly let him sleep during the scan. Please call [facility]

## 2016-02-10 NOTE — Telephone Encounter (Signed)
Called and spoke to patients wife. Advised Dr Rexene Alberts send rx xanax to pharmacy for anxiety/claustorphobia. He can take 1-2 tablets 30-60 minutes prior to MRI. He should not drive to or from MRI. Must have driver because it can cause sedation/drowsiness. She verbalized understanding.

## 2016-02-10 NOTE — Telephone Encounter (Signed)
I have ordered Xanax for patient's upcoming MRI due to anxiety/claustrophobia reported. Please inform patient or wife. Also reminded patient that he should not drive himself after taking Xanax and have someone take him for his MRI appointment.

## 2016-02-12 ENCOUNTER — Ambulatory Visit
Admission: RE | Admit: 2016-02-12 | Discharge: 2016-02-12 | Disposition: A | Discharge status: Home / Self Care | Payer: Medicare Other | Source: Ambulatory Visit | Attending: Neurology | Admitting: Neurology

## 2016-02-12 DIAGNOSIS — G250 Essential tremor: Secondary | ICD-10-CM

## 2016-02-12 DIAGNOSIS — E669 Obesity, unspecified: Secondary | ICD-10-CM

## 2016-02-12 DIAGNOSIS — E0842 Diabetes mellitus due to underlying condition with diabetic polyneuropathy: Secondary | ICD-10-CM

## 2016-02-13 ENCOUNTER — Telehealth: Payer: Self-pay | Admitting: Neurology

## 2016-02-13 NOTE — Telephone Encounter (Signed)
Patient called on 02/11/16 regarding instructions for the Xanax, which I prescribed for his MRI scan. The instructions on the prescription said take at bedtime as needed for anxiety. I apologize for the miscommunication. This medication was prescribed for his MRI: Patient was advised to take this medication before his MRI scan and take a second pill as needed. He should take it about 15 to 20 minutes before the scan. He demonstrated understanding and agreement.

## 2016-02-22 DIAGNOSIS — Z7984 Long term (current) use of oral hypoglycemic drugs: Secondary | ICD-10-CM | POA: Diagnosis not present

## 2016-02-22 DIAGNOSIS — E1140 Type 2 diabetes mellitus with diabetic neuropathy, unspecified: Secondary | ICD-10-CM | POA: Diagnosis not present

## 2016-03-17 ENCOUNTER — Other Ambulatory Visit: Payer: Self-pay | Admitting: Gastroenterology

## 2016-03-21 ENCOUNTER — Encounter (HOSPITAL_COMMUNITY): Payer: Self-pay | Admitting: *Deleted

## 2016-03-21 NOTE — Anesthesia Preprocedure Evaluation (Addendum)
Anesthesia Evaluation  Patient identified by MRN, date of birth, ID band Patient awake    Reviewed: Allergy & Precautions, H&P , NPO status , Patient's Chart, lab work & pertinent test results  Airway Mallampati: [name]  TM Distance: >3 FB Neck ROM: Full    Dental no notable dental hx. (+) Upper Dentures, Dental Advisory Given   Pulmonary sleep apnea , COPD,  COPD inhaler, former smoker,    Pulmonary exam normal breath sounds clear to auscultation       Cardiovascular Exercise Tolerance: Good hypertension, Pt. on medications + CAD and + Cardiac Stents   Rhythm:Regular Rate:Normal     Neuro/Psych Anxiety negative neurological ROS  negative psych ROS   GI/Hepatic Neg liver ROS, hiatal hernia, GERD  Medicated,  Endo/Other  diabetes, Type 2, Oral Hypoglycemic Agents  Renal/GU Renal InsufficiencyRenal disease  negative genitourinary   Musculoskeletal   Abdominal   Peds  Hematology negative hematology ROS (+) anemia ,   Anesthesia Other Findings   Reproductive/Obstetrics negative OB ROS                            Anesthesia Physical Anesthesia Plan  ASA: [name]  Anesthesia Plan: MAC   Post-op Pain Management:    Induction: Intravenous  Airway Management Planned: Nasal Cannula  Additional Equipment:   Intra-op Plan:   Post-operative Plan:   Informed Consent: I have reviewed the patients History and Physical, chart, labs and discussed the procedure including the risks, benefits and alternatives for the proposed anesthesia with the patient or authorized representative who has indicated his/her understanding and acceptance.   Dental advisory given  Plan Discussed with: CRNA  Anesthesia Plan Comments:         Anesthesia Quick Evaluation

## 2016-03-21 NOTE — Progress Notes (Signed)
Spoke with pt for pre-op call. Pt does have a cardiac history, sees Dr. Linard Millers. Last office visit was 11/2014. Pt states that Dr. Thompson Caul office notifies him when he needs to be seen. Pt has been having chest pain for over a year. Pt states that Dr. Tamala Julian did not want to do another cath on him because "Dr. Tamala Julian is 99% sure this pain is not coming from my heart". Pt states that was last year when he saw Dr. Tamala Julian in August. Pt states this why he is have the EGD. Pt is diabetic, last A1C was 7.1 a month and half ago per pt. He states he doesn't usually check his blood sugar. Pt states he was instructed to stop his diabetic medications 2 days prior to the procedure.  EKG - 12/17/15 in [facility] Cath - 2014 in [facility] - 11/25/14 in [facility]

## 2016-03-22 ENCOUNTER — Ambulatory Visit (HOSPITAL_COMMUNITY)
Admission: RE | Admit: 2016-03-22 | Discharge: 2016-03-22 | Disposition: A | Discharge status: Home / Self Care | Payer: Medicare Other | Source: Ambulatory Visit | Attending: Gastroenterology | Admitting: Gastroenterology

## 2016-03-22 ENCOUNTER — Encounter (HOSPITAL_COMMUNITY): Payer: Self-pay | Admitting: *Deleted

## 2016-03-22 ENCOUNTER — Ambulatory Visit (HOSPITAL_COMMUNITY): Payer: Medicare Other | Admitting: Anesthesiology

## 2016-03-22 ENCOUNTER — Encounter (HOSPITAL_COMMUNITY)
Admission: RE | Disposition: A | Discharge status: Home / Self Care | Payer: Self-pay | Source: Ambulatory Visit | Attending: Gastroenterology

## 2016-03-22 DIAGNOSIS — K2900 Acute gastritis without bleeding: Secondary | ICD-10-CM | POA: Diagnosis not present

## 2016-03-22 DIAGNOSIS — Z7982 Long term (current) use of aspirin: Secondary | ICD-10-CM | POA: Insufficient documentation

## 2016-03-22 DIAGNOSIS — Z860100 Personal history of colon polyps, unspecified: Secondary | ICD-10-CM

## 2016-03-22 DIAGNOSIS — R10816 Epigastric abdominal tenderness: Secondary | ICD-10-CM | POA: Diagnosis present

## 2016-03-22 DIAGNOSIS — J449 Chronic obstructive pulmonary disease, unspecified: Secondary | ICD-10-CM | POA: Insufficient documentation

## 2016-03-22 DIAGNOSIS — E119 Type 2 diabetes mellitus without complications: Secondary | ICD-10-CM | POA: Diagnosis not present

## 2016-03-22 DIAGNOSIS — I10 Essential (primary) hypertension: Secondary | ICD-10-CM | POA: Diagnosis not present

## 2016-03-22 DIAGNOSIS — Z7984 Long term (current) use of oral hypoglycemic drugs: Secondary | ICD-10-CM | POA: Insufficient documentation

## 2016-03-22 DIAGNOSIS — K5730 Diverticulosis of large intestine without perforation or abscess without bleeding: Secondary | ICD-10-CM | POA: Diagnosis not present

## 2016-03-22 DIAGNOSIS — Z955 Presence of coronary angioplasty implant and graft: Secondary | ICD-10-CM | POA: Insufficient documentation

## 2016-03-22 DIAGNOSIS — I2510 Atherosclerotic heart disease of native coronary artery without angina pectoris: Secondary | ICD-10-CM | POA: Diagnosis not present

## 2016-03-22 DIAGNOSIS — K219 Gastro-esophageal reflux disease without esophagitis: Secondary | ICD-10-CM | POA: Diagnosis not present

## 2016-03-22 DIAGNOSIS — D649 Anemia, unspecified: Secondary | ICD-10-CM | POA: Diagnosis not present

## 2016-03-22 DIAGNOSIS — Z87891 Personal history of nicotine dependence: Secondary | ICD-10-CM | POA: Insufficient documentation

## 2016-03-22 DIAGNOSIS — K2970 Gastritis, unspecified, without bleeding: Secondary | ICD-10-CM | POA: Diagnosis not present

## 2016-03-22 DIAGNOSIS — G4730 Sleep apnea, unspecified: Secondary | ICD-10-CM | POA: Insufficient documentation

## 2016-03-22 DIAGNOSIS — R1013 Epigastric pain: Secondary | ICD-10-CM | POA: Insufficient documentation

## 2016-03-22 DIAGNOSIS — Z79899 Other long term (current) drug therapy: Secondary | ICD-10-CM | POA: Diagnosis not present

## 2016-03-22 DIAGNOSIS — K640 First degree hemorrhoids: Secondary | ICD-10-CM | POA: Diagnosis not present

## 2016-03-22 DIAGNOSIS — K635 Polyp of colon: Secondary | ICD-10-CM | POA: Diagnosis not present

## 2016-03-22 HISTORY — DX: Polyneuropathy, unspecified: G62.9

## 2016-03-22 HISTORY — PX: ESOPHAGOGASTRODUODENOSCOPY (EGD) WITH PROPOFOL: SHX5813

## 2016-03-22 HISTORY — DX: Personal history of other diseases of the digestive system: Z87.19

## 2016-03-22 HISTORY — DX: Anemia, unspecified: D64.9

## 2016-03-22 HISTORY — DX: Other complications of anesthesia, initial encounter: T88.59XA

## 2016-03-22 HISTORY — DX: Adverse effect of unspecified anesthetic, initial encounter: T41.45XA

## 2016-03-22 HISTORY — PX: COLONOSCOPY WITH PROPOFOL: SHX5780

## 2016-03-22 LAB — GLUCOSE, CAPILLARY: Glucose-Capillary: 189 mg/dL — ABNORMAL HIGH (ref 65–99)

## 2016-03-22 SURGERY — ESOPHAGOGASTRODUODENOSCOPY (EGD) WITH PROPOFOL
Anesthesia: Monitor Anesthesia Care

## 2016-03-22 MED ORDER — PROPOFOL 500 MG/50ML IV EMUL
INTRAVENOUS | Status: DC | PRN
  Administered 2016-03-22: 10:00:00 via INTRAVENOUS
  Administered 2016-03-22: 80 ug/kg/min via INTRAVENOUS

## 2016-03-22 MED ORDER — LACTATED RINGERS IV SOLN
INTRAVENOUS | Status: DC
  Administered 2016-03-22: 1000 mL via INTRAVENOUS

## 2016-03-22 MED ORDER — LIDOCAINE HCL 1 % IJ SOLN
INTRAMUSCULAR | Status: DC | PRN
  Administered 2016-03-22: 40 mg via INTRADERMAL

## 2016-03-22 MED ORDER — PROPOFOL 10 MG/ML IV BOLUS
INTRAVENOUS | Status: DC | PRN
  Administered 2016-03-22: 10 mg via INTRAVENOUS
  Administered 2016-03-22: 20 mg via INTRAVENOUS

## 2016-03-22 MED ORDER — SODIUM CHLORIDE 0.9 % IV SOLN: INTRAVENOUS | Status: DC

## 2016-03-22 MED ORDER — GLYCOPYRROLATE 0.2 MG/ML IJ SOLN
INTRAMUSCULAR | Status: DC | PRN
  Administered 2016-03-22 (×2): 0.1 mg via INTRAVENOUS

## 2016-03-22 NOTE — Transfer of Care (Signed)
Immediate Anesthesia Transfer of Care Note  Patient: Marco Acevedo  Procedure(s) Performed: Procedure(s): ESOPHAGOGASTRODUODENOSCOPY (EGD) WITH PROPOFOL (N/A) COLONOSCOPY WITH PROPOFOL (N/A)  Patient Location: Endoscopy Unit  Anesthesia Type:MAC  Level of Consciousness: awake, alert  and oriented  Airway & Oxygen Therapy: Patient Spontanous Breathing  Post-op Assessment: Report given to RN and Post -op Vital signs reviewed and stable  Post vital signs: Reviewed and stable  Last Vitals:  Vitals:   03/22/16 0805 03/22/16 0953  BP: 126/69   Pulse: 87   Resp: (!) 21   Temp: 36.8 C 36.4 C    Last Pain:  Vitals:   03/22/16 0953  TempSrc: Oral         Complications: No apparent anesthesia complications

## 2016-03-22 NOTE — Op Note (Signed)
[facility] Patient Name: Marco Acevedo Procedure Date : 03/22/2016 MRN: [id number] Attending MD: Lear Ng , MD Date of Birth: 09-14-1934 CSN: [id number] Age: 81 Admit Type: Outpatient Procedure:                Colonoscopy Indications:              High risk colon cancer surveillance: Personal                            history of colonic polyps, Last colonoscopy: July                            2014 Providers:                Lear Ng, MD, Elmer Ramp. Tilden Dome, RN, Elspeth Cho Tech., Technician, Shelton Silvas, CRNA Referring MD:              Medicines:                Propofol per Anesthesia, Monitored Anesthesia Care Complications:            No immediate complications. Estimated Blood Loss:     Estimated blood loss: none. Procedure:                Pre-Anesthesia Assessment:                           - Prior to the procedure, a History and Physical                            was performed, and patient medications and                            allergies were reviewed. The patient's tolerance of                            previous anesthesia was also reviewed. The risks                            and benefits of the procedure and the sedation                            options and risks were discussed with the patient.                            All questions were answered, and informed consent                            was obtained. Prior Anticoagulants: The patient has                            taken no previous anticoagulant or antiplatelet  agents. ASA Grade Assessment: [name] - A patient with                            severe systemic disease. After reviewing the risks                            and benefits, the patient was deemed in                            satisfactory condition to undergo the procedure.                           After obtaining informed consent, the colonoscope                was passed under direct vision. Throughout the                            procedure, the patient's blood pressure, pulse, and                            oxygen saturations were monitored continuously. The                            [id number] ([id number]) scope was introduced through                            the anus and advanced to the the cecum, identified                            by appendiceal orifice and ileocecal valve. The                            colonoscopy was performed without difficulty. The                            patient tolerated the procedure well. The quality                            of the bowel preparation was fair and fair but                            repeated irrigation led to a good and adequate                            prep. The ileocecal valve, appendiceal orifice, and                            rectum were photographed. Scope In: 9:34:49 AM Scope Out: 9:42:24 AM Scope Withdrawal Time: 0 hours 5 minutes 12 seconds  Total Procedure Duration: 0 hours 7 minutes 35 seconds  Findings:      The perianal and digital rectal examinations were normal.      Multiple small and large-mouthed diverticula were found in the entire       colon.  Internal hemorrhoids were found during retroflexion. The hemorrhoids       were small and Grade I (internal hemorrhoids that do not prolapse).      The exam was otherwise normal throughout the examined colon. Impression:               - Preparation of the colon was fair.                           - Diverticulosis in the entire examined colon.                           - Internal hemorrhoids.                           - No specimens collected. Moderate Sedation:      N/A - MAC procedure Recommendation:           - Patient has a contact number available for                            emergencies. The signs and symptoms of potential                            delayed complications were discussed with the                             patient. Return to normal activities tomorrow.                            Written discharge instructions were provided to the                            patient.                           - High fiber diet.                           - Continue present medications.                           - Repeat colonoscopy is not recommended for                            screening purposes. Procedure Code(s):        --- Professional ---                           [phone], Colonoscopy, flexible; diagnostic, including                            collection of specimen(s) by brushing or washing,                            when performed (separate procedure) Diagnosis Code(s):        --- Professional ---  Z86.010, Personal history of colonic polyps                           K64.0, First degree hemorrhoids                           K57.30, Diverticulosis of large intestine without                            perforation or abscess without bleeding CPT copyright 2016 American [employer] Association. All rights reserved. The codes documented in this report are preliminary and upon coder review may  be revised to meet current compliance requirements. Lear Ng, MD 03/22/2016 10:01:10 AM This report has been signed electronically. Number of Addenda: 0

## 2016-03-22 NOTE — Anesthesia Procedure Notes (Signed)
Procedure Name: MAC Performed by: Valda Favia Pre-anesthesia Checklist: Patient identified, Emergency Drugs available, Suction available and Patient being monitored Patient Re-evaluated:Patient Re-evaluated prior to inductionOxygen Delivery Method: Circle system utilized Preoxygenation: Pre-oxygenation with 100% oxygen Intubation Type: IV induction Placement Confirmation: positive ETCO2 Dental Injury: Teeth and Oropharynx as per pre-operative assessment

## 2016-03-22 NOTE — H&P (Signed)
Date of Initial H&P: 03/15/16  History reviewed, patient examined, no change in status, stable for surgery.

## 2016-03-22 NOTE — Discharge Instructions (Signed)

## 2016-03-22 NOTE — Interval H&P Note (Signed)
History and Physical Interval Note:  03/22/2016 9:17 AM  Marco Acevedo  has presented today for surgery, with the diagnosis of epigastric pain/reflux/colon polyps  The various methods of treatment have been discussed with the patient and family. After consideration of risks, benefits and other options for treatment, the patient has consented to  Procedure(s): ESOPHAGOGASTRODUODENOSCOPY (EGD) WITH PROPOFOL (N/A) COLONOSCOPY WITH PROPOFOL (N/A) as a surgical intervention .  The patient's history has been reviewed, patient examined, no change in status, stable for surgery.  I have reviewed the patient's chart and labs.  Questions were answered to the patient's satisfaction.     [place] C.

## 2016-03-22 NOTE — Op Note (Signed)
[facility] Patient Name: Marco Acevedo Procedure Date : 03/22/2016 MRN: [id number] Attending MD: Lear Ng , MD Date of Birth: [birth year] CSN: [id number] Age: 81 Admit Type: Outpatient Procedure:                Upper GI endoscopy Indications:              Epigastric abdominal pain Providers:                Lear Ng, MD, Tory Emerald, RN, Elspeth Cho Tech., Technician, Shelton Silvas, CRNA Referring MD:              Medicines:                Propofol per Anesthesia, Monitored Anesthesia Care Complications:            No immediate complications. Estimated Blood Loss:     Estimated blood loss: none. Procedure:                Pre-Anesthesia Assessment:                           - Prior to the procedure, a History and Physical                            was performed, and patient medications and                            allergies were reviewed. The patient's tolerance of                            previous anesthesia was also reviewed. The risks                            and benefits of the procedure and the sedation                            options and risks were discussed with the patient.                            All questions were answered, and informed consent                            was obtained. Prior Anticoagulants: The patient has                            taken no previous anticoagulant or antiplatelet                            agents. ASA Grade Assessment: [name] - A patient with                            severe systemic disease. After reviewing the risks  and benefits, the patient was deemed in                            satisfactory condition to undergo the procedure.                           After obtaining informed consent, the endoscope was                            passed under direct vision. Throughout the                            procedure, the patient's blood pressure,  pulse, and                            oxygen saturations were monitored continuously. The                            [id number] ([id number]) scope was introduced through the                            mouth, and advanced to the second part of duodenum.                            The upper GI endoscopy was accomplished without                            difficulty. The patient tolerated the procedure                            well. Scope In: Scope Out: Findings:      The examined esophagus was normal.      The Z-line was regular and was found 45 cm from the incisors.      Segmental mild inflammation characterized by congestion (edema) and       erythema was found in the gastric antrum and in the prepyloric region of       the stomach.      The cardia and gastric fundus were normal on retroflexion.      The exam of the stomach was otherwise normal.      The examined duodenum was normal. Impression:               - Normal esophagus.                           - Z-line regular, 45 cm from the incisors.                           - Acute gastritis.                           - Normal examined duodenum.                           - No specimens collected. Moderate Sedation:      N/A - MAC procedure  Recommendation:           - Patient has a contact number available for                            emergencies. The signs and symptoms of potential                            delayed complications were discussed with the                            patient. Return to normal activities tomorrow.                            Written discharge instructions were provided to the                            patient.                           - Follow an antireflux regimen.                           - Continue present medications. Procedure Code(s):        --- Professional ---                           [phone], Esophagogastroduodenoscopy, flexible,                            transoral; diagnostic, including  collection of                            specimen(s) by brushing or washing, when performed                            (separate procedure) Diagnosis Code(s):        --- Professional ---                           R10.13, Epigastric pain                           K29.00, Acute gastritis without bleeding CPT copyright 2016 American Medical Association. All rights reserved. The codes documented in this report are preliminary and upon coder review may  be revised to meet current compliance requirements. Lear Ng, MD 03/22/2016 9:55:25 AM This report has been signed electronically. Number of Addenda: 0

## 2016-03-22 NOTE — Anesthesia Postprocedure Evaluation (Signed)
Anesthesia Post Note  Patient: Marco Acevedo  Procedure(s) Performed: Procedure(s) (LRB): ESOPHAGOGASTRODUODENOSCOPY (EGD) WITH PROPOFOL (N/A) COLONOSCOPY WITH PROPOFOL (N/A)  Patient location during evaluation: PACU Anesthesia Type: MAC Level of consciousness: awake and alert Pain management: pain level controlled Vital Signs Assessment: post-procedure vital signs reviewed and stable Respiratory status: spontaneous breathing, nonlabored ventilation and respiratory function stable Cardiovascular status: stable and blood pressure returned to baseline Anesthetic complications: no    Last Vitals:  Vitals:   03/22/16 0953 03/22/16 1000  BP: (!) 87/58 95/60  Pulse: 78   Resp: (!) 24   Temp: 36.4 C     Last Pain:  Vitals:   03/22/16 0953  TempSrc: Oral                 Deadrian Toya,W. EDMOND

## 2016-03-29 DIAGNOSIS — M65332 Trigger finger, left middle finger: Secondary | ICD-10-CM | POA: Diagnosis not present

## 2016-04-13 DIAGNOSIS — L600 Ingrowing nail: Secondary | ICD-10-CM | POA: Diagnosis not present

## 2016-04-13 DIAGNOSIS — M79675 Pain in left toe(s): Secondary | ICD-10-CM | POA: Diagnosis not present

## 2016-04-18 DIAGNOSIS — L600 Ingrowing nail: Secondary | ICD-10-CM | POA: Diagnosis not present

## 2016-06-01 DIAGNOSIS — K5900 Constipation, unspecified: Secondary | ICD-10-CM | POA: Diagnosis not present

## 2016-06-01 DIAGNOSIS — K219 Gastro-esophageal reflux disease without esophagitis: Secondary | ICD-10-CM | POA: Diagnosis not present

## 2016-06-01 DIAGNOSIS — R079 Chest pain, unspecified: Secondary | ICD-10-CM | POA: Diagnosis not present

## 2016-06-20 ENCOUNTER — Encounter: Payer: Self-pay | Admitting: Interventional Cardiology

## 2016-06-25 NOTE — Progress Notes (Signed)
Cardiology Office Note    Date:  06/26/2016   ID:  Marco Acevedo, DOB March 25, 1935, MRN [id number]  PCP:  Melinda Crutch, MD  Cardiologist: Sinclair Grooms, MD   Chief Complaint  Patient presents with  . Coronary Artery Disease    History of Present Illness:  Marco Acevedo is a 82 y.o. male with CAD and prior circumflex stents, Chronic DHF, hyperlipidemia, hypertension, obesity and OSA.  Increasing episodes of chest discomfort. Some occur at rest. Activity aggravates the episodes of discomfort. Nitroglycerin relieves the discomfort. He has undergone extensive GI workup and no obvious findings that explain his complaints have been noted. Symptoms have been progressing over the past 6 months.  Past Medical History:  Diagnosis Date  . Anemia    low iron  . BPH (benign prostatic hyperplasia)   . Bruises easily   . CAD (coronary artery disease)    no prior MI, PCI x 2 in 2009 and 02/2011 in [place]  . Claustrophobia   . Complication of anesthesia    has to have head elevated to keep from strangling on post nasal drip  . COPD (chronic obstructive pulmonary disease) ([place])   . Diabetes mellitus type II    type 2  . Diarrhea 2015   had for a year and a half  . GERD (gastroesophageal reflux disease)   . Gout   . Granuloma annulare   . History of hiatal hernia   . History of kidney stones   . Hyperlipidemia   . Hypertension   . Neuropathy ([place])   . Shortness of breath dyspnea    with exertion  . Wears dentures    top  . Wears glasses     Past Surgical History:  Procedure Laterality Date  . BLEPHAROPLASTY    . CARDIAC CATHETERIZATION  [phone]   X 2 stents  . CHOLECYSTECTOMY  09/23/2014  . CHOLECYSTECTOMY N/A 09/23/2014   Procedure: LAPAROSCOPIC CHOLECYSTECTOMY WITH INTRAOPERATIVE CHOLANGIOGRAM;  Surgeon: Autumn Messing [name], MD;  Location: [place];  Service: General;  Laterality: N/A;  . COLONOSCOPY    . COLONOSCOPY WITH PROPOFOL N/A 03/22/2016   Procedure: COLONOSCOPY WITH  PROPOFOL;  Surgeon: Wilford Corner, MD;  Location: [facility] ENDOSCOPY;  Service: Endoscopy;  Laterality: N/A;  . ESOPHAGOGASTRODUODENOSCOPY (EGD) WITH PROPOFOL N/A 03/22/2016   Procedure: ESOPHAGOGASTRODUODENOSCOPY (EGD) WITH PROPOFOL;  Surgeon: Wilford Corner, MD;  Location: [facility] ENDOSCOPY;  Service: Endoscopy;  Laterality: N/A;  . EYE SURGERY     both cataracts  . KNEE ARTHROSCOPY WITH MEDIAL MENISECTOMY Right 08/19/2013   Procedure: RIGHT KNEE ARTHROSCOPY WITH PARTIAL MEDIAL MENISECTOMY AND CHONDROPLASTY;  Surgeon: Hessie Dibble, MD;  Location: [place];  Service: Orthopedics;  Laterality: Right;  . LEFT HEART CATHETERIZATION WITH CORONARY ANGIOGRAM N/A 10/03/2011   Procedure: LEFT HEART CATHETERIZATION WITH CORONARY ANGIOGRAM;  Surgeon: Sinclair Grooms, MD;  Location: [facility] CATH LAB;  Service: Cardiovascular;  Laterality: N/A;  . LEFT HEART CATHETERIZATION WITH CORONARY ANGIOGRAM N/A 09/12/2012   Procedure: LEFT HEART CATHETERIZATION WITH CORONARY ANGIOGRAM;  Surgeon: Sinclair Grooms, MD;  Location: [facility] CATH LAB;  Service: Cardiovascular;  Laterality: N/A;  . LIPOMA EXCISION     Biospy only; left parotid gland  . none    . PERCUTANEOUS CORONARY STENT INTERVENTION (PCI-S) N/A 09/17/2012   Procedure: PERCUTANEOUS CORONARY STENT INTERVENTION (PCI-S);  Surgeon: Sinclair Grooms, MD;  Location: [facility] CATH LAB;  Service: Cardiovascular;  Laterality: N/A;  . TRIGGER FINGER RELEASE  Left 12/21/2015   Procedure: LEFT LONG FINGER TRIGGER RELEASE ;  Surgeon: Milly Jakob, MD;  Location: [place];  Service: Orthopedics;  Laterality: Left;    Current Medications: Outpatient Medications Prior to Visit  Medication Sig Dispense Refill  . albuterol (PROVENTIL HFA) 108 (90 BASE) MCG/ACT inhaler Inhale 2 puffs into the lungs every 6 (six) hours as needed for wheezing or shortness of breath.     [name] albuterol (PROVENTIL) (2.5 MG/3ML) 0.083% nebulizer solution Take 2.5 mg by  nebulization every 6 (six) hours as needed for wheezing or shortness of breath.     . allopurinol (ZYLOPRIM) 100 MG tablet Take 100 mg by mouth daily.    [name] aspirin EC 81 MG tablet Take 81 mg by mouth daily.    [name] b complex vitamins capsule Take 1 capsule by mouth daily.    . Cyanocobalamin (VITAMIN B 12) 100 MCG LOZG Take by mouth.    . Ferrous Sulfate (IRON) 325 (65 FE) MG TABS Take 1 tablet by mouth daily.    . finasteride (PROSCAR) 5 MG tablet Take 1 tablet (5 mg total) by mouth daily. 30 tablet 5  . gemfibrozil (LOPID) 600 MG tablet Take 600 mg by mouth daily.    [name] guaifenesin (HUMIBID E) 400 MG TABS tablet Take 800 mg by mouth daily.     . isosorbide mononitrate (IMDUR) 120 MG 24 hr tablet Take 1 tablet (120 mg total) by mouth daily. 90 tablet 3  . nitroGLYCERIN (NITROSTAT) 0.4 MG SL tablet Place 0.4 mg under the tongue every 5 (five) minutes as needed for chest pain.    . pravastatin (PRAVACHOL) 40 MG tablet Take 40 mg by mouth daily.    . primidone (MYSOLINE) 50 MG tablet Take 50 mg by mouth at bedtime.     . ranitidine (ZANTAC) 300 MG tablet Take 300 mg by mouth at bedtime.    [name] tiotropium (SPIRIVA) 18 MCG inhalation capsule Place 36 mcg into inhaler and inhale every morning.     [name] ALPRAZolam (XANAX) 0.5 MG tablet Take 1 tablet (0.5 mg total) by mouth at bedtime as needed for anxiety. (Patient not taking: Reported on 06/26/2016) 2 tablet 0  . glyBURIDE (DIABETA) 5 MG tablet Take 5 mg by mouth daily.    . metFORMIN (GLUCOPHAGE) 500 MG tablet Take 500 mg by mouth 2 (two) times daily.    [name] omeprazole (PRILOSEC) 20 MG capsule Take 1 capsule (20 mg total) by mouth 2 (two) times daily before a meal. (Patient not taking: Reported on 06/26/2016) 60 capsule 0   No facility-administered medications prior to visit.      Allergies:   Patient has no known allergies.   Social History   Social History  . Marital status: Married    Spouse name: N/A  . Number of children: N/A  . Years of  education: N/A   Occupational History  . retired    Social History Main Topics  . Smoking status: Former Smoker    Packs/day: 1.00    Years: 64.00    Types: Cigarettes    Quit date: 05/29/2011  . Smokeless tobacco: Never Used  . Alcohol use Yes     Comment: rarely  . Drug use: No  . Sexual activity: Not Currently   Other Topics Concern  . None   Social History Narrative   Denies Caffeine use      Family History:  The patient's family history includes Aneurysm in his mother; Aortic aneurysm  in his father; COPD in his sister; Cancer in his brother; Other in his brother, sister, and sister.   ROS:   Please see the history of present illness.    Snoring, S food intake, recent chills, chest pain, shortness of breath, easy bruising, chest pressure, and headaches. Under stress because wife is hospitalized. Had angina several times while trying to get to his wife's room at the hospital . Has been told by his doctor at the [facility] to he could not have heart catheterization again because of kidney disease. All other systems reviewed and are negative.   PHYSICAL EXAM:   VS:  BP 132/74 (BP Location: Left Arm)   Pulse 87   Ht 5\' 8"  (1.727 m)   Wt 216 lb 9.6 oz (98.2 kg)   BMI 32.93 kg/m    GEN: Well nourished, well developed, in no acute distress  HEENT: normal  Neck: no JVD, carotid bruits, or masses Cardiac: RRR; no murmurs, rubs, or gallops,no edema  Respiratory:  clear to auscultation bilaterally, normal work of breathing GI: soft, nontender, nondistended, + BS MS: no deformity or atrophy  Skin: warm and dry, no rash Neuro:  Alert and Oriented x 3, Strength and sensation are intact Psych: euthymic mood, full affect  Wt Readings from Last 3 Encounters:  06/26/16 216 lb 9.6 oz (98.2 kg)  03/22/16 218 lb (98.9 kg)  01/18/16 218 lb (98.9 kg)      Studies/Labs Reviewed:   EKG:  EKG  Recently performed at Dr. Melinda Crutch, [place] [facility]. We will retrieve a  copy.  Recent Labs: 12/17/2015: BUN 29; Creatinine, Ser 1.69; Potassium 4.9; Sodium 139   Lipid Panel    Component Value Date/Time   CHOL 187 10/03/2011 0836   TRIG 189 (H) 10/03/2011 0836   HDL 48 10/03/2011 0836   CHOLHDL 3.9 10/03/2011 0836   VLDL 38 10/03/2011 0836   LDLCALC 101 (H) 10/03/2011 0836    Additional studies/ records that were reviewed today include:  Creatinine is not available but estimated GFR is 35. LDL 58 this is from laboratory work done at [facility] in current is 1    ASSESSMENT:    1. Coronary artery disease involving native coronary artery of native heart with angina pectoris ([place])   2. Essential hypertension   3. OSA (obstructive sleep apnea)   4. CKD (chronic kidney disease) stage 3, GFR 30-59 ml/min      PLAN:  In order of problems listed above:  1. Add Ranexa 500 mg twice daily. Return in 3-4 weeks for clinical follow-up. Needs coronary angiography. Suspect progression of disease and or restenosis. Will base approach on response to therapy and updated laboratory data which will be done within the next week or 2. 2. Controlled. Low salt diet. 3. C Pap should be used daily. 4. Will reassess kidney function and next office visit and if angina is not improved with Ranexa, he may need to have coronary angiography.    Medication Adjustments/Labs and Tests Ordered: Current medicines are reviewed at length with the patient today.  Concerns regarding medicines are outlined above.  Medication changes, Labs and Tests ordered today are listed in the Patient Instructions below. Patient Instructions  Medication Instructions:  1) START Ranexa 500mg  twice daily  Labwork: None  Testing/Procedures: None  Follow-Up: Your physician recommends that you schedule a follow-up appointment in: 1 month with Dr. Tamala Julian. (Can have 4/25 at 10:20AM)   Any Other Special Instructions Will Be Listed  Below (If Applicable).  Please contact our office in one  week and let us know how your chest pain/discomfort is after starting the Ranexa.    If you need a refill on your cardiac medications before your next appointment, please call your pharmacy.      Signed, Sinclair Grooms, MD  06/26/2016 10:03 AM    [address], [place], [place]  [address] Phone: [phone]; Fax: [phone]

## 2016-06-26 ENCOUNTER — Encounter: Payer: Self-pay | Admitting: Interventional Cardiology

## 2016-06-26 ENCOUNTER — Ambulatory Visit (INDEPENDENT_AMBULATORY_CARE_PROVIDER_SITE_OTHER): Payer: Medicare Other | Admitting: Interventional Cardiology

## 2016-06-26 VITALS — BP 132/74 | HR 87 | Ht 68.0 in | Wt 216.6 lb

## 2016-06-26 DIAGNOSIS — N183 Chronic kidney disease, stage 3 unspecified: Secondary | ICD-10-CM

## 2016-06-26 DIAGNOSIS — G4733 Obstructive sleep apnea (adult) (pediatric): Secondary | ICD-10-CM | POA: Diagnosis not present

## 2016-06-26 DIAGNOSIS — I10 Essential (primary) hypertension: Secondary | ICD-10-CM

## 2016-06-26 DIAGNOSIS — I25119 Atherosclerotic heart disease of native coronary artery with unspecified angina pectoris: Secondary | ICD-10-CM

## 2016-06-26 DIAGNOSIS — I209 Angina pectoris, unspecified: Secondary | ICD-10-CM

## 2016-06-26 MED ORDER — RANOLAZINE ER 500 MG PO TB12: 500.0000 mg | ORAL_TABLET | Freq: Two times a day (BID) | ORAL | 11 refills | Status: DC

## 2016-06-26 NOTE — Patient Instructions (Addendum)
Medication Instructions:  1) START Ranexa 500mg  twice daily  Labwork: None  Testing/Procedures: None  Follow-Up: Your physician recommends that you schedule a follow-up appointment in: 1 month with Dr. Tamala Julian. (Can have 4/25 at 10:20AM)   Any Other Special Instructions Will Be Listed Below (If Applicable).  Please contact our office in one week and let us know how your chest pain/discomfort is after starting the Ranexa.    If you need a refill on your cardiac medications before your next appointment, please call your pharmacy.

## 2016-06-27 ENCOUNTER — Encounter (HOSPITAL_COMMUNITY): Payer: Self-pay

## 2016-06-27 ENCOUNTER — Emergency Department (HOSPITAL_COMMUNITY): Payer: Medicare Other

## 2016-06-27 ENCOUNTER — Observation Stay (HOSPITAL_COMMUNITY)
Admission: EM | Admit: 2016-06-27 | Discharge: 2016-06-29 | Disposition: A | Discharge status: Home / Self Care | Payer: Medicare Other | Attending: Cardiology | Admitting: Cardiology

## 2016-06-27 ENCOUNTER — Telehealth: Payer: Self-pay | Admitting: Interventional Cardiology

## 2016-06-27 DIAGNOSIS — Z6833 Body mass index (BMI) 33.0-33.9, adult: Secondary | ICD-10-CM | POA: Insufficient documentation

## 2016-06-27 DIAGNOSIS — E119 Type 2 diabetes mellitus without complications: Secondary | ICD-10-CM

## 2016-06-27 DIAGNOSIS — Z955 Presence of coronary angioplasty implant and graft: Secondary | ICD-10-CM | POA: Insufficient documentation

## 2016-06-27 DIAGNOSIS — E1122 Type 2 diabetes mellitus with diabetic chronic kidney disease: Secondary | ICD-10-CM | POA: Diagnosis not present

## 2016-06-27 DIAGNOSIS — K219 Gastro-esophageal reflux disease without esophagitis: Secondary | ICD-10-CM | POA: Diagnosis not present

## 2016-06-27 DIAGNOSIS — Z7982 Long term (current) use of aspirin: Secondary | ICD-10-CM | POA: Diagnosis not present

## 2016-06-27 DIAGNOSIS — E669 Obesity, unspecified: Secondary | ICD-10-CM | POA: Diagnosis not present

## 2016-06-27 DIAGNOSIS — D649 Anemia, unspecified: Secondary | ICD-10-CM | POA: Insufficient documentation

## 2016-06-27 DIAGNOSIS — N183 Chronic kidney disease, stage 3 (moderate): Secondary | ICD-10-CM | POA: Diagnosis not present

## 2016-06-27 DIAGNOSIS — E7800 Pure hypercholesterolemia, unspecified: Secondary | ICD-10-CM | POA: Insufficient documentation

## 2016-06-27 DIAGNOSIS — Z87891 Personal history of nicotine dependence: Secondary | ICD-10-CM | POA: Insufficient documentation

## 2016-06-27 DIAGNOSIS — E785 Hyperlipidemia, unspecified: Secondary | ICD-10-CM | POA: Diagnosis not present

## 2016-06-27 DIAGNOSIS — I130 Hypertensive heart and chronic kidney disease with heart failure and stage 1 through stage 4 chronic kidney disease, or unspecified chronic kidney disease: Secondary | ICD-10-CM | POA: Diagnosis not present

## 2016-06-27 DIAGNOSIS — Y831 Surgical operation with implant of artificial internal device as the cause of abnormal reaction of the patient, or of later complication, without mention of misadventure at the time of the procedure: Secondary | ICD-10-CM | POA: Insufficient documentation

## 2016-06-27 DIAGNOSIS — E1140 Type 2 diabetes mellitus with diabetic neuropathy, unspecified: Secondary | ICD-10-CM | POA: Diagnosis not present

## 2016-06-27 DIAGNOSIS — M109 Gout, unspecified: Secondary | ICD-10-CM | POA: Insufficient documentation

## 2016-06-27 DIAGNOSIS — J449 Chronic obstructive pulmonary disease, unspecified: Secondary | ICD-10-CM | POA: Diagnosis not present

## 2016-06-27 DIAGNOSIS — R072 Precordial pain: Secondary | ICD-10-CM | POA: Diagnosis not present

## 2016-06-27 DIAGNOSIS — Z7984 Long term (current) use of oral hypoglycemic drugs: Secondary | ICD-10-CM | POA: Insufficient documentation

## 2016-06-27 DIAGNOSIS — I25119 Atherosclerotic heart disease of native coronary artery with unspecified angina pectoris: Secondary | ICD-10-CM | POA: Diagnosis present

## 2016-06-27 DIAGNOSIS — I5032 Chronic diastolic (congestive) heart failure: Secondary | ICD-10-CM | POA: Insufficient documentation

## 2016-06-27 DIAGNOSIS — R0789 Other chest pain: Secondary | ICD-10-CM | POA: Diagnosis not present

## 2016-06-27 DIAGNOSIS — R079 Chest pain, unspecified: Secondary | ICD-10-CM | POA: Diagnosis present

## 2016-06-27 DIAGNOSIS — I25110 Atherosclerotic heart disease of native coronary artery with unstable angina pectoris: Secondary | ICD-10-CM | POA: Diagnosis not present

## 2016-06-27 DIAGNOSIS — G4733 Obstructive sleep apnea (adult) (pediatric): Secondary | ICD-10-CM | POA: Insufficient documentation

## 2016-06-27 DIAGNOSIS — N184 Chronic kidney disease, stage 4 (severe): Secondary | ICD-10-CM | POA: Diagnosis present

## 2016-06-27 DIAGNOSIS — T82858A Stenosis of vascular prosthetic devices, implants and grafts, initial encounter: Secondary | ICD-10-CM | POA: Insufficient documentation

## 2016-06-27 DIAGNOSIS — I200 Unstable angina: Secondary | ICD-10-CM | POA: Diagnosis present

## 2016-06-27 DIAGNOSIS — N400 Enlarged prostate without lower urinary tract symptoms: Secondary | ICD-10-CM | POA: Insufficient documentation

## 2016-06-27 DIAGNOSIS — Z79899 Other long term (current) drug therapy: Secondary | ICD-10-CM | POA: Insufficient documentation

## 2016-06-27 HISTORY — DX: Chronic diastolic (congestive) heart failure: I50.32

## 2016-06-27 HISTORY — DX: Obstructive sleep apnea (adult) (pediatric): G47.33

## 2016-06-27 HISTORY — DX: Chronic kidney disease, stage 3 (moderate): N18.3

## 2016-06-27 HISTORY — DX: Obesity, unspecified: E66.9

## 2016-06-27 HISTORY — DX: Chronic kidney disease, stage 3 unspecified: N18.30

## 2016-06-27 LAB — BASIC METABOLIC PANEL
ANION GAP: 12 (ref 5–15)
BUN: 33 mg/dL — AB (ref 6–20)
CO2: 25 mmol/L (ref 22–32)
Calcium: 9.8 mg/dL (ref 8.9–10.3)
Chloride: 102 mmol/L (ref 101–111)
Creatinine, Ser: 1.98 mg/dL — ABNORMAL HIGH (ref 0.61–1.24)
GFR calc Af Amer: 35 mL/min — ABNORMAL LOW (ref >60)
GFR, EST NON AFRICAN AMERICAN: 30 mL/min — AB (ref >60)
GLUCOSE: 99 mg/dL (ref 65–99)
POTASSIUM: 4.5 mmol/L (ref 3.5–5.1)
Sodium: 139 mmol/L (ref 135–145)

## 2016-06-27 LAB — TSH: TSH: 1.284 u[IU]/mL (ref 0.350–4.500)

## 2016-06-27 LAB — HEPATIC FUNCTION PANEL
ALT: 19 U/L (ref 17–63)
AST: 24 U/L (ref 15–41)
Albumin: 3.7 g/dL (ref 3.5–5.0)
Alkaline Phosphatase: 77 U/L (ref 38–126)
Bilirubin, Direct: 0.1 mg/dL (ref 0.1–0.5)
Indirect Bilirubin: 0.7 mg/dL (ref 0.3–0.9)
TOTAL PROTEIN: 7.1 g/dL (ref 6.5–8.1)
Total Bilirubin: 0.8 mg/dL (ref 0.3–1.2)

## 2016-06-27 LAB — PROTIME-INR
INR: 1.05
PROTHROMBIN TIME: 13.7 s (ref 11.4–15.2)

## 2016-06-27 LAB — TROPONIN I

## 2016-06-27 LAB — CBC
HEMATOCRIT: 37.1 % — AB (ref 39.0–52.0)
HEMOGLOBIN: 12.8 g/dL — AB (ref 13.0–17.0)
MCH: 30 pg (ref 26.0–34.0)
MCHC: 34.5 g/dL (ref 30.0–36.0)
MCV: 86.9 fL (ref 78.0–100.0)
Platelets: 309 10*3/uL (ref 150–400)
RBC: 4.27 MIL/uL (ref 4.22–5.81)
RDW: 13.7 % (ref 11.5–15.5)
WBC: 6.5 10*3/uL (ref 4.0–10.5)

## 2016-06-27 LAB — I-STAT TROPONIN, ED: Troponin i, poc: 0 ng/mL (ref 0.00–0.08)

## 2016-06-27 MED ORDER — ALLOPURINOL 100 MG PO TABS
100.0000 mg | ORAL_TABLET | Freq: Every day | ORAL | Status: DC
  Administered 2016-06-28 – 2016-06-29 (×2): 100 mg via ORAL
  Filled 2016-06-27 (×2): qty 1

## 2016-06-27 MED ORDER — ALBUTEROL SULFATE (2.5 MG/3ML) 0.083% IN NEBU: 2.5000 mg | INHALATION_SOLUTION | Freq: Four times a day (QID) | RESPIRATORY_TRACT | Status: DC | PRN

## 2016-06-27 MED ORDER — SODIUM CHLORIDE 0.9% FLUSH
3.0000 mL | Freq: Two times a day (BID) | INTRAVENOUS | Status: DC
  Administered 2016-06-28 – 2016-06-29 (×2): 3 mL via INTRAVENOUS

## 2016-06-27 MED ORDER — SODIUM CHLORIDE 0.9 % WEIGHT BASED INFUSION
1.0000 mL/kg/h | INTRAVENOUS | Status: DC
  Administered 2016-06-27: 1 mL/kg/h via INTRAVENOUS

## 2016-06-27 MED ORDER — SODIUM CHLORIDE 0.9% FLUSH: 3.0000 mL | INTRAVENOUS | Status: DC | PRN

## 2016-06-27 MED ORDER — SODIUM CHLORIDE 0.9 % IV SOLN: 250.0000 mL | INTRAVENOUS | Status: DC | PRN

## 2016-06-27 MED ORDER — TIOTROPIUM BROMIDE MONOHYDRATE 18 MCG IN CAPS
18.0000 ug | ORAL_CAPSULE | Freq: Every day | RESPIRATORY_TRACT | Status: DC
  Administered 2016-06-28 – 2016-06-29 (×2): 18 ug via RESPIRATORY_TRACT
  Filled 2016-06-27: qty 5

## 2016-06-27 MED ORDER — ACETAMINOPHEN 325 MG PO TABS
650.0000 mg | ORAL_TABLET | ORAL | Status: DC | PRN
  Administered 2016-06-29: 650 mg via ORAL
  Filled 2016-06-27: qty 2

## 2016-06-27 MED ORDER — ASPIRIN 81 MG PO CHEW
81.0000 mg | CHEWABLE_TABLET | ORAL | Status: AC
  Administered 2016-06-28: 81 mg via ORAL
  Filled 2016-06-27: qty 1

## 2016-06-27 MED ORDER — FERROUS SULFATE 325 (65 FE) MG PO TABS
325.0000 mg | ORAL_TABLET | Freq: Every day | ORAL | Status: DC
  Administered 2016-06-28 – 2016-06-29 (×2): 325 mg via ORAL
  Filled 2016-06-27 (×2): qty 1

## 2016-06-27 MED ORDER — FAMOTIDINE 20 MG PO TABS
40.0000 mg | ORAL_TABLET | Freq: Every day | ORAL | Status: DC
  Administered 2016-06-27 – 2016-06-28 (×2): 40 mg via ORAL
  Filled 2016-06-27 (×2): qty 2

## 2016-06-27 MED ORDER — GLYBURIDE 5 MG PO TABS
5.0000 mg | ORAL_TABLET | Freq: Two times a day (BID) | ORAL | Status: DC
  Filled 2016-06-27: qty 1

## 2016-06-27 MED ORDER — FINASTERIDE 5 MG PO TABS
5.0000 mg | ORAL_TABLET | Freq: Every day | ORAL | Status: DC
  Administered 2016-06-28 – 2016-06-29 (×2): 5 mg via ORAL
  Filled 2016-06-27 (×2): qty 1

## 2016-06-27 MED ORDER — VITAMIN B-12 1000 MCG PO TABS
1000.0000 ug | ORAL_TABLET | Freq: Every day | ORAL | Status: DC
  Administered 2016-06-28 – 2016-06-29 (×2): 1000 ug via ORAL
  Filled 2016-06-27 (×2): qty 1

## 2016-06-27 MED ORDER — PRIMIDONE 50 MG PO TABS
50.0000 mg | ORAL_TABLET | Freq: Every day | ORAL | Status: DC
  Administered 2016-06-27 – 2016-06-28 (×2): 50 mg via ORAL
  Filled 2016-06-27 (×2): qty 1

## 2016-06-27 MED ORDER — ALBUTEROL SULFATE HFA 108 (90 BASE) MCG/ACT IN AERS: 2.0000 | INHALATION_SPRAY | Freq: Four times a day (QID) | RESPIRATORY_TRACT | Status: DC | PRN

## 2016-06-27 MED ORDER — INSULIN ASPART 100 UNIT/ML ~~LOC~~ SOLN
0.0000 [IU] | Freq: Three times a day (TID) | SUBCUTANEOUS | Status: DC
  Administered 2016-06-28: 2 [IU] via SUBCUTANEOUS

## 2016-06-27 MED ORDER — GUAIFENESIN 200 MG PO TABS
800.0000 mg | ORAL_TABLET | Freq: Every day | ORAL | Status: DC
  Administered 2016-06-27 – 2016-06-28 (×2): 800 mg via ORAL
  Filled 2016-06-27 (×2): qty 4

## 2016-06-27 MED ORDER — SUCRALFATE 1 GM/10ML PO SUSP
1.0000 g | Freq: Two times a day (BID) | ORAL | Status: DC
  Administered 2016-06-28 – 2016-06-29 (×3): 1 g via ORAL
  Filled 2016-06-27 (×5): qty 10

## 2016-06-27 MED ORDER — ONDANSETRON HCL 4 MG/2ML IJ SOLN: 4.0000 mg | Freq: Four times a day (QID) | INTRAMUSCULAR | Status: DC | PRN

## 2016-06-27 MED ORDER — GEMFIBROZIL 600 MG PO TABS
600.0000 mg | ORAL_TABLET | Freq: Every day | ORAL | Status: DC
  Administered 2016-06-28: 600 mg via ORAL
  Filled 2016-06-27: qty 1

## 2016-06-27 MED ORDER — PRAVASTATIN SODIUM 40 MG PO TABS
40.0000 mg | ORAL_TABLET | Freq: Every day | ORAL | Status: DC
  Administered 2016-06-28 – 2016-06-29 (×2): 40 mg via ORAL
  Filled 2016-06-27 (×2): qty 1

## 2016-06-27 MED ORDER — RANOLAZINE ER 500 MG PO TB12
500.0000 mg | ORAL_TABLET | Freq: Two times a day (BID) | ORAL | Status: DC
  Administered 2016-06-27 – 2016-06-29 (×4): 500 mg via ORAL
  Filled 2016-06-27 (×4): qty 1

## 2016-06-27 MED ORDER — ASPIRIN EC 81 MG PO TBEC
81.0000 mg | DELAYED_RELEASE_TABLET | Freq: Every day | ORAL | Status: DC
  Administered 2016-06-28 – 2016-06-29 (×2): 81 mg via ORAL
  Filled 2016-06-27 (×2): qty 1

## 2016-06-27 MED ORDER — HEPARIN BOLUS VIA INFUSION
4000.0000 [IU] | Freq: Once | INTRAVENOUS | Status: AC
  Administered 2016-06-27: 4000 [IU] via INTRAVENOUS
  Filled 2016-06-27: qty 4000

## 2016-06-27 MED ORDER — HEPARIN (PORCINE) IN NACL 100-0.45 UNIT/ML-% IJ SOLN
1150.0000 [IU]/h | INTRAMUSCULAR | Status: DC
  Administered 2016-06-27 – 2016-06-28 (×2): 1150 [IU]/h via INTRAVENOUS
  Filled 2016-06-27 (×2): qty 250

## 2016-06-27 MED ORDER — ASPIRIN 81 MG PO CHEW
324.0000 mg | CHEWABLE_TABLET | ORAL | Status: AC
  Administered 2016-06-27: 324 mg via ORAL
  Filled 2016-06-27: qty 4

## 2016-06-27 MED ORDER — ISOSORBIDE MONONITRATE ER 60 MG PO TB24
120.0000 mg | ORAL_TABLET | Freq: Every day | ORAL | Status: DC
  Filled 2016-06-27: qty 2

## 2016-06-27 MED ORDER — NITROGLYCERIN 0.4 MG SL SUBL: 0.4000 mg | SUBLINGUAL_TABLET | SUBLINGUAL | Status: DC | PRN

## 2016-06-27 MED ORDER — ASPIRIN 300 MG RE SUPP: 300.0000 mg | RECTAL | Status: AC

## 2016-06-27 MED ORDER — B COMPLEX VITAMINS PO CAPS: 1.0000 | ORAL_CAPSULE | Freq: Every evening | ORAL | Status: DC

## 2016-06-27 NOTE — ED Triage Notes (Addendum)
Pt woke up last night sweating and chest discomfort.  Has taken Nitro x 3 with no relief.  Dr. Tamala Julian recommended ED to be admitted possible cath tomorrow.

## 2016-06-27 NOTE — ED Provider Notes (Signed)
[facility] DEPT Provider Note   CSN: [id number] Arrival date & time: 06/27/16  1611     History   Chief Complaint Chief Complaint  Patient presents with  . Chest Pain    HPI DMARI SCHUBRING is a 82 y.o. male With a past medical history significant for CAD status post stenting, CHF, hypertension, hyperlipidemia, obesity, sleep apnea, diabetes, GERD, and CKD who presents at the direction of his cardiologist for further management of chest pain. Patient reports aggressive chest pain over the last few months. Patient has had a workup to for G.I. etiology with no findings. Due to worsening symptoms, diaphoresis, and response to nitroglycerin, and was told to come in ED for admission and heart catheterization tomorrow with cardiology.  On arrival, patient says is chest pain is minimal while he is resting. Denies current shortness of breath. He reports his chest pain is exertional and feels similar to his prior heart attack.    He denies nausea, vomiting, constipation, diarrhea, dysuria, rashes, trauma, fevers, or chills. He denies other symptoms on arrival.    The history is provided by the patient and medical records. No language interpreter was used.  Chest Pain   This is a recurrent problem. The current episode started more than 2 days ago. The problem occurs daily. The problem has been gradually improving. The pain is associated with exertion. The pain is present in the substernal region. The pain is at a severity of 0/10 (none on inital exam). The patient is experiencing no pain. The pain does not radiate. Associated symptoms include shortness of breath. Pertinent negatives include no abdominal pain, no back pain, no cough, no diaphoresis, no dizziness, no fever, no headaches, no malaise/fatigue, no nausea, no near-syncope, no palpitations, no vomiting and no weakness. He has tried rest and nitroglycerin for the symptoms. The treatment provided significant relief. Risk factors include  male gender and obesity.  His past medical history is significant for CAD, CHF and MI.    Past Medical History:  Diagnosis Date  . Anemia    low iron  . BPH (benign prostatic hyperplasia)   . Bruises easily   . CAD (coronary artery disease)    a. per Dr. Thompson Caul note, prior stenting of Cx in 2007 and 2012. b. DES to Cx in 09/2012.  [name] Chronic diastolic CHF (congestive heart failure) ([place])   . CKD (chronic kidney disease), stage [name]   . Claustrophobia   . Complication of anesthesia    has to have head elevated to keep from strangling on post nasal drip  . COPD (chronic obstructive pulmonary disease) ([place])   . Diabetes mellitus type II    type 2  . Diarrhea 2015   had for a year and a half  . GERD (gastroesophageal reflux disease)   . Gout   . Granuloma annulare   . History of hiatal hernia   . History of kidney stones   . Hyperlipidemia   . Hypertension   . Neuropathy ([place])   . Obesity   . OSA (obstructive sleep apnea)   . Wears dentures    top  . Wears glasses     Patient Active Problem List   Diagnosis Date Noted  . Epigastric abdominal tenderness 03/22/2016  . Hx of colonic polyps 03/22/2016  . Status post surgery 09/23/2014  . Gallstones 09/23/2014  . Parotid mass   . Essential hypertension 05/29/2013  . CKD (chronic kidney disease) stage 3, GFR 30-59 ml/min 09/13/2012    Class:  Chronic  . Coronary artery disease involving native coronary artery of native heart with angina pectoris ([place]) 09/12/2012    Class: Acute  . Pure hypercholesterolemia 09/01/2012  . URI (upper respiratory infection) 03/05/2012  . COPD (chronic obstructive pulmonary disease) 05/11/2011  . OSA (obstructive sleep apnea) 05/11/2011    Past Surgical History:  Procedure Laterality Date  . BLEPHAROPLASTY    . CARDIAC CATHETERIZATION  [phone]   X 2 stents  . CHOLECYSTECTOMY  09/23/2014  . CHOLECYSTECTOMY N/A 09/23/2014   Procedure: LAPAROSCOPIC CHOLECYSTECTOMY WITH INTRAOPERATIVE  CHOLANGIOGRAM;  Surgeon: Autumn Messing [name], MD;  Location: [facility];  Service: General;  Laterality: N/A;  . COLONOSCOPY    . COLONOSCOPY WITH PROPOFOL N/A 03/22/2016   Procedure: COLONOSCOPY WITH PROPOFOL;  Surgeon: Wilford Corner, MD;  Location: [facility] ENDOSCOPY;  Service: Endoscopy;  Laterality: N/A;  . ESOPHAGOGASTRODUODENOSCOPY (EGD) WITH PROPOFOL N/A 03/22/2016   Procedure: ESOPHAGOGASTRODUODENOSCOPY (EGD) WITH PROPOFOL;  Surgeon: Wilford Corner, MD;  Location: [facility] ENDOSCOPY;  Service: Endoscopy;  Laterality: N/A;  . EYE SURGERY     both cataracts  . KNEE ARTHROSCOPY WITH MEDIAL MENISECTOMY Right 08/19/2013   Procedure: RIGHT KNEE ARTHROSCOPY WITH PARTIAL MEDIAL MENISECTOMY AND CHONDROPLASTY;  Surgeon: Hessie Dibble, MD;  Location: [facility];  Service: Orthopedics;  Laterality: Right;  . LEFT HEART CATHETERIZATION WITH CORONARY ANGIOGRAM N/A 10/03/2011   Procedure: LEFT HEART CATHETERIZATION WITH CORONARY ANGIOGRAM;  Surgeon: Sinclair Grooms, MD;  Location: [facility] CATH LAB;  Service: Cardiovascular;  Laterality: N/A;  . LEFT HEART CATHETERIZATION WITH CORONARY ANGIOGRAM N/A 09/12/2012   Procedure: LEFT HEART CATHETERIZATION WITH CORONARY ANGIOGRAM;  Surgeon: Sinclair Grooms, MD;  Location: [facility] CATH LAB;  Service: Cardiovascular;  Laterality: N/A;  . LIPOMA EXCISION     Biospy only; left parotid gland  . none    . PERCUTANEOUS CORONARY STENT INTERVENTION (PCI-S) N/A 09/17/2012   Procedure: PERCUTANEOUS CORONARY STENT INTERVENTION (PCI-S);  Surgeon: Sinclair Grooms, MD;  Location[facility] C[facility] LAB;  Service: Cardiovascular;  Laterality: N/A;  . TRIGGER FINGER RELEASE Left 12/21/2015   Procedure: LEFT LONG FINGER TRIGGER RELEASE ;  Surgeon: Milly Jakob, MD;  Location: [facility];  Service: Orthopedics;  Laterality: Left;       Home Medications    Prior to Admission medications   Medication Sig Start Date End Date Taking? Authorizing Provider  albuterol (PROVENTIL  HFA) 108 (90 BASE) MCG/ACT inhaler Inhale 2 puffs into the lungs every 6 (six) hours as needed for wheezing or shortness of breath.     Historical Provider, MD  albuterol (PROVENTIL) (2.5 MG/3ML) 0.083% nebulizer solution Take 2.5 mg by nebulization every 6 (six) hours as needed for wheezing or shortness of breath.     Historical Provider, MD  allopurinol (ZYLOPRIM) 100 MG tablet Take 100 mg by mouth daily.    Historical Provider, MD  aspirin EC 81 MG tablet Take 81 mg by mouth daily.    Historical Provider, MD  b complex vitamins capsule Take 1 capsule by mouth every evening.     Historical Provider, MD  Ferrous Sulfate (IRON) 325 (65 FE) MG TABS Take 1 tablet by mouth daily.    Historical Provider, MD  finasteride (PROSCAR) 5 MG tablet Take 1 tablet (5 mg total) by mouth daily. 11/29/11   Rigoberto Noel, MD  gemfibrozil (LOPID) 600 MG tablet Take 600 mg by mouth at bedtime.     Historical Provider, MD  glyBURIDE (DIABETA) 5 MG tablet  Take 5 mg by mouth 2 (two) times daily with a meal.    Historical Provider, MD  guaifenesin (HUMIBID E) 400 MG TABS tablet Take 800 mg by mouth at bedtime.     Historical Provider, MD  isosorbide mononitrate (IMDUR) 120 MG 24 hr tablet Take 1 tablet (120 mg total) by mouth daily. 11/20/14   Belva Crome, MD  nitroGLYCERIN (NITROSTAT) 0.4 MG SL tablet Place 0.4 mg under the tongue every 5 (five) minutes as needed for chest pain.    Historical Provider, MD  pravastatin (PRAVACHOL) 40 MG tablet Take 40 mg by mouth daily.    Historical Provider, MD  primidone (MYSOLINE) 50 MG tablet Take 50 mg by mouth at bedtime.     Historical Provider, MD  ranitidine (ZANTAC) 300 MG tablet Take 300 mg by mouth at bedtime.    Historical Provider, MD  ranolazine (RANEXA) 500 MG 12 hr tablet Take 1 tablet (500 mg total) by mouth 2 (two) times daily. 06/26/16   Belva Crome, MD  sucralfate (CARAFATE) 1 GM/10ML suspension Take 1 g by mouth 2 (two) times daily.    Historical Provider, MD    tiotropium (SPIRIVA) 18 MCG inhalation capsule Place 18 mcg into inhaler and inhale every morning.     Historical Provider, MD  vitamin B-12 (CYANOCOBALAMIN) 1000 MCG tablet Take 1,000 mcg by mouth daily.    Historical Provider, MD    Family History Family History  Problem Relation Age of Onset  . Aortic aneurysm Father   . Aneurysm Mother     brain  . Cancer Brother   . COPD Sister   . Other Sister     health hx unknown  . Other Sister     health hx unknown  . Other Brother     health hx unknown    Social History Social History  Substance Use Topics  . Smoking status: Former Smoker    Packs/day: 1.00    Years: 64.00    Types: Cigarettes    Quit date: 05/29/2011  . Smokeless tobacco: Never Used  . Alcohol use Yes     Comment: rarely     Allergies   Patient has no known allergies.   Review of Systems Review of Systems  Constitutional: Negative for activity change, chills, diaphoresis, fatigue, fever and malaise/fatigue.  HENT: Negative for congestion and rhinorrhea.   Eyes: Negative for visual disturbance.  Respiratory: Positive for shortness of breath. Negative for cough, chest tightness, wheezing and stridor.   Cardiovascular: Positive for chest pain. Negative for palpitations, leg swelling and near-syncope.  Gastrointestinal: Negative for abdominal distention, abdominal pain, blood in stool, constipation, diarrhea, nausea and vomiting.  Genitourinary: Negative for difficulty urinating, dysuria and flank pain.  Musculoskeletal: Negative for back pain and gait problem.  Skin: Negative for rash and wound.  Neurological: Negative for dizziness, weakness, light-headedness and headaches.  Psychiatric/Behavioral: Negative for agitation.  All other systems reviewed and are negative.    Physical Exam Updated Vital Signs BP (!) 163/89 (BP Location: Left Arm)   Pulse 82   Resp 18   SpO2 96%   Physical Exam  Constitutional: He is oriented to person, place, and  time. He appears well-developed and well-nourished. No distress.  HENT:  Head: Normocephalic and atraumatic.  Right Ear: External ear normal.  Left Ear: External ear normal.  Nose: Nose normal.  Mouth/Throat: Oropharynx is clear and moist. No oropharyngeal exudate.  Eyes: Conjunctivae and EOM are normal. Pupils are equal, round,  and reactive to light.  Neck: Normal range of motion. Neck supple.  Cardiovascular: Normal rate, normal heart sounds and intact distal pulses.   No murmur heard. Pulmonary/Chest: Effort normal and breath sounds normal. No stridor. No respiratory distress. He has no wheezes. He exhibits no tenderness.  Abdominal: Soft. There is no tenderness. There is no rebound and no guarding.  Musculoskeletal: He exhibits no edema or tenderness.  Neurological: He is alert and oriented to person, place, and time. He displays normal reflexes. No cranial nerve deficit. He exhibits normal muscle tone. Coordination normal.  Skin: Skin is warm. Capillary refill takes less than 2 seconds. No rash noted. He is not diaphoretic. No erythema.  Psychiatric: He has a normal mood and affect.     ED Treatments / Results  Labs (all labs ordered are listed, but only abnormal results are displayed) Labs Reviewed  BASIC METABOLIC PANEL - Abnormal; Notable for the following:       Result Value   BUN 33 (*)    Creatinine, Ser 1.98 (*)    GFR calc non Af Amer 30 (*)    GFR calc Af Amer 35 (*)    All other components within normal limits  CBC - Abnormal; Notable for the following:    Hemoglobin 12.8 (*)    HCT 37.1 (*)    All other components within normal limits  HEPARIN LEVEL (UNFRACTIONATED)  CBC  PROTIME-INR  I-STAT TROPOININ, ED    EKG  EKG Interpretation  Date/Time:  Tuesday June 27 2016 17:06:45 EDT Ventricular Rate:  82 PR Interval:  174 QRS Duration: 74 QT Interval:  358 QTC Calculation: 418 R Axis:   42 Text Interpretation:  Normal sinus rhythm Normal ECG when  compared to prior, no significant changes seen.  No STEMI Confirmed by Sherry Ruffing MD, [place] [phone]) on 06/27/2016 8:25:00 PM       Radiology Dg Chest 2 View  Result Date: 06/27/2016 CLINICAL DATA:  Center and slightly left chest pressure EXAM: CHEST  2 VIEW COMPARISON:  11/02/2014 FINDINGS: No focal pulmonary infiltrate, consolidation, or pleural effusion. Cardiomediastinal silhouette within normal limits. No pneumothorax. Degenerative changes of the spine. IMPRESSION: No active cardiopulmonary disease. Electronically Signed   By: Donavan Foil M.D.   On: 06/27/2016 17:48    Procedures Procedures (including critical care time)  Medications Ordered in ED Medications  heparin bolus via infusion 4,000 Units (not administered)  heparin ADULT infusion 100 units/mL (25000 units/242mL sodium chloride 0.45%) (not administered)     Initial Impression / Assessment and Plan / ED Course  I have reviewed the triage vital signs and the nursing notes.  Pertinent labs & imaging results that were available during my care of the patient were reviewed by me and considered in my medical decision making (see chart for details).     IDRISS QUACKENBUSH is a 82 y.o. male With a past medical history significant for CAD status post stenting, CHF, hypertension, hyperlipidemia, obesity, sleep apnea, diabetes, GERD, and CKD who presents at the direction of his cardiologist for further management of chest pain.  History and exam are seen above. On exam, patient's chest is nontender. Patient's lungs are clear. Patient has no focal neurologic deficits.  Given patients prior stenting with his CAD, and similarity to prior, clinical concern for cardiac chest pain. Initial workup showed negative troponin. Creatinine slightly elevated at 1.7. No leukocytosis. Chest x-ray unremarkable. EKG appeared similar to prior with no evidence of ischemia.  Cardiology quickly came to  the bedside after arrival to the ED.   They will  at the patient for further management. They started the patient on heparin and will admit him to their service for heart catheterization tomorrow for concerning high risk chest pain. Patient did not have further chest pain in the ED and was admitted in stable condition.   Final Clinical Impressions(s) / ED Diagnoses   Final diagnoses:  Precordial chest pain   Clinical Impression: 1. Precordial chest pain     Disposition: Admit to cardiology    Courtney Paris, MD 06/28/16 1239

## 2016-06-27 NOTE — Progress Notes (Signed)
ANTICOAGULATION CONSULT NOTE - Initial Consult  Pharmacy Consult for Heparin Indication: chest pain/ACS  No Known Allergies  Patient Measurements:   Heparin Dosing Weight: 89.3 kg  Vital Signs: BP: 163/89 (03/20 1936) Pulse Rate: 82 (03/20 1936)  Labs:  Recent Labs  06/27/16 1712  HGB 12.8*  HCT 37.1*  PLT 309  CREATININE 1.98*    Estimated Creatinine Clearance: 34.4 mL/min (A) (by C-G formula based on SCr of 1.98 mg/dL (H)).   Medical History: Past Medical History:  Diagnosis Date  . Anemia    low iron  . BPH (benign prostatic hyperplasia)   . Bruises easily   . CAD (coronary artery disease)    a. per Dr. Thompson Caul note, prior stenting of Cx in 2007 and 2012. b. DES to Cx in 09/2012.  [name] Chronic diastolic CHF (congestive heart failure) ([place])   . CKD (chronic kidney disease), stage [name]   . Claustrophobia   . Complication of anesthesia    has to have head elevated to keep from strangling on post nasal drip  . COPD (chronic obstructive pulmonary disease) ([place])   . Diabetes mellitus type II    type 2  . Diarrhea 2015   had for a year and a half  . GERD (gastroesophageal reflux disease)   . Gout   . Granuloma annulare   . History of hiatal hernia   . History of kidney stones   . Hyperlipidemia   . Hypertension   . Neuropathy ([place])   . Obesity   . OSA (obstructive sleep apnea)   . Wears dentures    top  . Wears glasses     Medications:   (Not in a hospital admission) Scheduled:  Infusions:  PRN:   Assessment: Patient is a 40 yom who presented 3/20 w/ chest pain. Pharmacy was consulted to start heparin. No anticoagulation PTA.   Baseline CBC shows hemoglobin of 12.8 and platelet count within normal limits. No bleeding noted.   Goal of Therapy:  Heparin level 0.3-0.7 units/ml Monitor platelets by anticoagulation protocol: Yes   Plan:  Give 4000 units bolus x 1 Start heparin infusion at 1150 units/hr Check anti-Xa level in 8 hours and daily while  on heparin Continue to monitor H&H and platelets  F/U post-cath, likely 3/21  Demetrius Charity, PharmD Acute Care Pharmacy Resident  Pager: [phone] 06/27/2016

## 2016-06-27 NOTE — H&P (Signed)
Cardiology History & Physical    Patient ID: SELESTINO NILA MRN: [id number], DOB: [birth year] Date of Encounter: 06/27/2016, 5:51 PM Primary Physician: Melinda Crutch, MD Primary Cardiologist: Dr. Tamala Julian  Chief Complaint: chest pain Reason for Admission: chest pain  HPI: ENIS RIECKE is a 82 y.o. male with history of CAD (prior Cx stenting in 2007 and 2012, DES to prox Cx 2014), chronic diastolic CHF, hyperlipidemia, HTN, obesity, OSA, DM, hiatal hernia, neuropathy, GERD, CKD stage [name] (last OP Cr 1.69) who presents to [facility] with chest pain. He was recently seen by Dr. Tamala Julian 06/26/16 reporting increasing chest pain with exertion. Per Dr. Thompson Caul note, he has undergone extensive GI workup and no obvious findings that explain his complaints have been noted. Symptoms have been progressing over the past 6 months. Dr. Tamala Julian added Ranexa and recommended to consider coronary angiography based on clinical response. The patient called today to give his 24 hour report. Last night he had a very heavy sweating episode. This AM he developed recurrent chest pain improved with 1 SL NTG but returned after an hour and a half. He took a second nitro and was chest pain free afterwards for 4 hours, but pain again returned, prompting phone call. He was asked to come to the ER for further management and cardiac cath in AM.   Most recent cath was in 2014 - appears there were discussions about surgical intervention versus PCI - ultimately underwent DES to the prox Cx at that time. Last nuc 11/2014: "There was a medium-sized, mild primarily reversible basal to mid inferolateral perfusion defect and a small reversible apical septal perfusion defect with normal wall motion.  It is possible that this represents primarily ischemia, but the stress study is overall count poor compared to the rest.  Overall, probably intermediate risk due to the extent of the defects. "  I-stat troponin is negative. Hgb 12.8, Cr 1.98. CXR no active disease.  He currently feels a very mild chest tightness.  Past Medical History:  Diagnosis Date  . Anemia    low iron  . BPH (benign prostatic hyperplasia)   . Bruises easily   . CAD (coronary artery disease)    a. per Dr. Thompson Caul note, prior stenting of Cx in 2007 and 2012. b. DES to Cx in 09/2012.  [name] Chronic diastolic CHF (congestive heart failure) ([place])   . CKD (chronic kidney disease), stage [name]   . Claustrophobia   . Complication of anesthesia    has to have head elevated to keep from strangling on post nasal drip  . COPD (chronic obstructive pulmonary disease) ([place])   . Diabetes mellitus type II    type 2  . Diarrhea 2015   had for a year and a half  . GERD (gastroesophageal reflux disease)   . Gout   . Granuloma annulare   . History of hiatal hernia   . History of kidney stones   . Hyperlipidemia   . Hypertension   . Neuropathy ([place])   . Obesity   . OSA (obstructive sleep apnea)   . Wears dentures    top  . Wears glasses     Significant Diagnostic Studies:  INDICATION: Class IV angina due to high-grade obstruction in the proximal circumflex coronary artery  PROCEDURE: Focal/spot stenting of the proximal circumflex with DES stent  CONSENT:  The risks, benefits, and details of the procedure were explained to the patient. Risks including death, MI, stroke, bleeding, limb ischemia, renal failure and  allergy were described and accepted by the patient. Informed written consent was obtained prior to proceeding.  PROCEDURE TECHNIQUE: After Xylocaine anesthesia a 6 French sheath was placed in the right femoral artery with a single anterior needle wall stick. Coronary guiding shots were made using a 6 [place] CLS catheter. Antithrombotic therapy, Angiomax, was begun and determined to be therapeutic by ACT. Antiplatelet therapy, Plavix 300 mg, was loaded on top of chronic daily Plavix therapy.  Guiding shots were obtained and the procedure performed using an aside he pro-water wire that  tracked nicely through the lesion without anticipated difficulty. We predilated with a 2.5 x 12 mm long Monorail balloon to 14 atmospheres. We positioned and deployed a 2.5 x 12 mm Promus Premier drug-eluting stent and deployed to 14 atmospheres which wasn't effective diameter of 2.75 mm. 2 balloon inflations were performed. We then had difficulty post-dilating with a 2.75 x 8 mm tract. A buddy wire using a BMW allowed the tract to be positioned and post dilatation was performed at 13 atmospheres. 200 mcg of intracoronary nitroglycerin was administered. Angiographic images were obtained with and without the wires in place. The result was felt to be adequate and the case terminated.  A sheath shot was performed and we attempted to place a Angio-Seal plug, however the device would not enter into the artery, possibly due to vessel calcification and a steep angle of entry. We therefore upgraded to a 7 French sheath and will remove the sheath with manual compression after Angiomax has abated.  CONTRAST: Total of 215 cc.  COMPLICATIONS: None.  ANGIOGRAPHIC RESULTS: 95% eccentric proximal circumflex obstruction reduced to 0% with TIMI grade 3 flow. Persistent residual 50-70% ostial stenosis was not treated.  IMPRESSIONS: Successful proximal circumflex stenting with a 12 mm Promus Premier DES to effective diameter of 2.75 mm.  RECOMMENDATION: Continue aggressive risk factor modification and medical therapy. IV hydration is mandatory because of renal insufficiency and the volume of contrast required for the case. Possible discharge in a.m.Marland KitchenMarland Kitchen  Sinclair Grooms, MD  09/17/2012  10:36 AM  Otherwise diagnostic cath on 09/13/15 showed: IMPRESSIONS:  1. Class IV angina due to high-grade obstruction in the proximal circumflex. The circumflex lesion is complex because there is also moderately severe ostial disease with calcification as the vessel arises at a right angle from the left main LAD transition. The distal stent  is patent with 50% stenosis at the distal margin. 2. Moderate distal RCA disease, 50-70% distal before the origin of the PDA. 3. Irregularities within the LAD but no high-grade obstruction. 4. No significant left main disease but eccentric calcified plaque noted at the bifurcation with the circumflex. 5. Hyperdynamic left ventricular function 6. Stage [name] chronic renal insufficiency with creatinine of 1.54    Surgical History:  Past Surgical History:  Procedure Laterality Date  . BLEPHAROPLASTY    . CARDIAC CATHETERIZATION  [phone]   X 2 stents  . CHOLECYSTECTOMY  09/23/2014  . CHOLECYSTECTOMY N/A 09/23/2014   Procedure: LAPAROSCOPIC CHOLECYSTECTOMY WITH INTRAOPERATIVE CHOLANGIOGRAM;  Surgeon: Autumn Messing [name], MD;  Location: [place];  Service: General;  Laterality: N/A;  . COLONOSCOPY    . COLONOSCOPY WITH PROPOFOL N/A 03/22/2016   Procedure: COLONOSCOPY WITH PROPOFOL;  Surgeon: Wilford Corner, MD;  Location[facility] ENDOSCOPY;  Service: Endoscopy;  Laterality: N/A;  . ESOPHAGOGASTRODUODENOSCOPY (EGD) WITH PROPOFOL N/A 03/22/2016   Procedure: ESOPHAGOGASTRODUODENOSCOPY (EGD) WITH PROPOFOL;  Surgeon: Wilford Corner, MD;  Location: [facility] ENDOSCOPY;  Service: Endoscopy;  Laterality: N/A;  . EYE  SURGERY     both cataracts  . KNEE ARTHROSCOPY WITH MEDIAL MENISECTOMY Right 08/19/2013   Procedure: RIGHT KNEE ARTHROSCOPY WITH PARTIAL MEDIAL MENISECTOMY AND CHONDROPLASTY;  Surgeon: Hessie Dibble, MD;  Location: [place];  Service: Orthopedics;  Laterality: Right;  . LEFT HEART CATHETERIZATION WITH CORONARY ANGIOGRAM N/A 10/03/2011   Procedure: LEFT HEART CATHETERIZATION WITH CORONARY ANGIOGRAM;  Surgeon: Sinclair Grooms, MD;  Location: [facility] CATH LAB;  Service: Cardiovascular;  Laterality: N/A;  . LEFT HEART CATHETERIZATION WITH CORONARY ANGIOGRAM N/A 09/12/2012   Procedure: LEFT HEART CATHETERIZATION WITH CORONARY ANGIOGRAM;  Surgeon: Sinclair Grooms, MD;  Location: [facility] CATH LAB;   Service: Cardiovascular;  Laterality: N/A;  . LIPOMA EXCISION     Biospy only; left parotid gland  . none    . PERCUTANEOUS CORONARY STENT INTERVENTION (PCI-S) N/A 09/17/2012   Procedure: PERCUTANEOUS CORONARY STENT INTERVENTION (PCI-S);  Surgeon: Sinclair Grooms, MD;  Location: [facility] CATH LAB;  Service: Cardiovascular;  Laterality: N/A;  . TRIGGER FINGER RELEASE Left 12/21/2015   Procedure: LEFT LONG FINGER TRIGGER RELEASE ;  Surgeon: Milly Jakob, MD;  Location: [place];  Service: Orthopedics;  Laterality: Left;     Home Meds: Prior to Admission medications   Medication Sig Start Date End Date Taking? Authorizing Provider  albuterol (PROVENTIL HFA) 108 (90 BASE) MCG/ACT inhaler Inhale 2 puffs into the lungs every 6 (six) hours as needed for wheezing or shortness of breath.     Historical Provider, MD  albuterol (PROVENTIL) (2.5 MG/3ML) 0.083% nebulizer solution Take 2.5 mg by nebulization every 6 (six) hours as needed for wheezing or shortness of breath.     Historical Provider, MD  allopurinol (ZYLOPRIM) 100 MG tablet Take 100 mg by mouth daily.    Historical Provider, MD  aspirin EC 81 MG tablet Take 81 mg by mouth daily.    Historical Provider, MD  b complex vitamins capsule Take 1 capsule by mouth every evening.     Historical Provider, MD  Ferrous Sulfate (IRON) 325 (65 FE) MG TABS Take 1 tablet by mouth daily.    Historical Provider, MD  finasteride (PROSCAR) 5 MG tablet Take 1 tablet (5 mg total) by mouth daily. 11/29/11   Rigoberto Noel, MD  gemfibrozil (LOPID) 600 MG tablet Take 600 mg by mouth at bedtime.     Historical Provider, MD  glyBURIDE (DIABETA) 5 MG tablet Take 5 mg by mouth 2 (two) times daily with a meal.    Historical Provider, MD  guaifenesin (HUMIBID E) 400 MG TABS tablet Take 800 mg by mouth at bedtime.     Historical Provider, MD  isosorbide mononitrate (IMDUR) 120 MG 24 hr tablet Take 1 tablet (120 mg total) by mouth daily. 11/20/14   Belva Crome,  MD  nitroGLYCERIN (NITROSTAT) 0.4 MG SL tablet Place 0.4 mg under the tongue every 5 (five) minutes as needed for chest pain.    Historical Provider, MD  pravastatin (PRAVACHOL) 40 MG tablet Take 40 mg by mouth daily.    Historical Provider, MD  primidone (MYSOLINE) 50 MG tablet Take 50 mg by mouth at bedtime.     Historical Provider, MD  ranitidine (ZANTAC) 300 MG tablet Take 300 mg by mouth at bedtime.    Historical Provider, MD  ranolazine (RANEXA) 500 MG 12 hr tablet Take 1 tablet (500 mg total) by mouth 2 (two) times daily. 06/26/16   Belva Crome, MD  sucralfate ([place]) 1  GM/10ML suspension Take 1 g by mouth 2 (two) times daily.    Historical Provider, MD  tiotropium (SPIRIVA) 18 MCG inhalation capsule Place 18 mcg into inhaler and inhale every morning.     Historical Provider, MD  vitamin B-12 (CYANOCOBALAMIN) 1000 MCG tablet Take 1,000 mcg by mouth daily.    Historical Provider, MD    Allergies: No Known Allergies  Social History   Social History  . Marital status: Married    Spouse name: N/A  . Number of children: N/A  . Years of education: N/A   Occupational History  . retired    Social History Main Topics  . Smoking status: Former Smoker    Packs/day: 1.00    Years: 64.00    Types: Cigarettes    Quit date: 05/29/2011  . Smokeless tobacco: Never Used  . Alcohol use Yes     Comment: rarely  . Drug use: No  . Sexual activity: Not Currently   Other Topics Concern  . Not on file   Social History Narrative   Denies Caffeine use      Family History  Problem Relation Age of Onset  . Aortic aneurysm Father   . Aneurysm Mother     brain  . Cancer Brother   . COPD Sister   . Other Sister     health hx unknown  . Other Sister     health hx unknown  . Other Brother     health hx unknown    Review of Systems: prior pain relieved with carafate several months ago but pain is now exertional. All other systems reviewed and are otherwise negative except as noted  above.  Labs:   Lab Results  Component Value Date   WBC 6.5 06/27/2016   HGB 12.8 (L) 06/27/2016   HCT 37.1 (L) 06/27/2016   MCV 86.9 06/27/2016   PLT 309 06/27/2016   Lab Results  Component Value Date   CHOL 187 10/03/2011   HDL 48 10/03/2011   LDLCALC 101 (H) 10/03/2011   TRIG 189 (H) 10/03/2011    Radiology/Studies:  Dg Chest 2 View  Result Date: 06/27/2016 CLINICAL DATA:  Center and slightly left chest pressure EXAM: CHEST  2 VIEW COMPARISON:  11/02/2014 FINDINGS: No focal pulmonary infiltrate, consolidation, or pleural effusion. Cardiomediastinal silhouette within normal limits. No pneumothorax. Degenerative changes of the spine. IMPRESSION: No active cardiopulmonary disease. Electronically Signed   By: Donavan Foil M.D.   On: 06/27/2016 17:48   Wt Readings from Last 3 Encounters:  06/26/16 216 lb 9.6 oz (98.2 kg)  03/22/16 218 lb (98.9 kg)  01/18/16 218 lb (98.9 kg)    EKG: NSR - slight TWI in room [name] otherwise nonacute   Physical Exam: Most recent BP 132/74, HR 82bpm, Weight 216lb, RR 14 General: Well developed, well nourished WM, in no acute distress. Pleasant, jovial Head: Normocephalic, atraumatic, sclera non-icteric, no xanthomas, nares are without discharge.  Neck: Negative for carotid bruits. JVD not elevated. Lungs: Clear bilaterally to auscultation without wheezes, rales, or rhonchi. Breathing is unlabored. Heart: RRR with S1 S2. No murmurs, rubs, or gallops appreciated. Abdomen: Soft, non-tender with normoactive bowel sounds. + Ventral hernia. No hepatomegaly. No rebound/guarding. No obvious abdominal masses. Msk:  Strength and tone appear normal for age. Extremities: No clubbing or cyanosis. No edema.  Distal pedal pulses are 2+ and equal bilaterally. Neuro: Alert and oriented X 3. No focal deficit. No facial asymmetry. Moves all extremities spontaneously. Psych:  Responds to questions  appropriately with a normal affect.    Assessment and Plan  59M  with CAD (prior Cx stenting in 2007 and 2012, DES to prox Cx 2014), chronic diastolic CHF, hyperlipidemia, HTN, obesity, OSA, DM, hiatal hernia, neuropathy, GERD, CKD stage [name] (last OP Cr 1.69) who presents to [facility] with increasing frequency of exertional chest pain.  1. Chest pain concerning for unstable angina with known history of CAD as above 2. Chronic diastolic CHF 3. Essential HTN 4. CKD stage [name] 5. Diabetes mellitus 6. Hyperlipidemia  See below for comprehensive discussion with MD. Plan for coronary catheterization in AM. Will need pre-cath hydration for CKD; prior EF was normal (73% in 2016). Will also add SSI and cycle troponins. Add heparin per pharmacy. Cardiac cath posted for 1500 tomorrow with Dr. Tamala Julian.   Signed, Charlie Pitter PA-C 06/27/2016, 5:51 PM Pager: [phone] As above, patient seen and examined. Briefly he is a 82 year old male with past medical history of coronary artery disease status post prior PCI, hypertension, hyperlipidemia, diabetes mellitus, chronic stage [name] kidney disease, diastolic congestive heart failure with unstable angina. Patient states that for the past 8 months he has had intermittent chest pain. It can occur with exertion but also at rest. Some relief with nitroglycerin. No associated symptoms. He was seen in the office yesterday at Dr. Tamala Julian an outpatient catheterization was planned. However he had recurrent symptoms today and was instructed to come to the emergency room. At present he has mild pain. Creatinine 1.98. Initial troponin negative. Hemoglobin 12.8. Electrocardiogram shows sinus rhythm with no ST changes.  1 unstable angina-patient continues with intermittent chest pain. Both typical and atypical features. Plan was to proceed with cardiac catheterization and we will arrange this for tomorrow. The risks and benefits including myocardial infarction, CVA and death were discussed and he agrees to proceed. He has baseline renal insufficiency. We will  hydrate overnight and recheck tomorrow morning. He understands the risk of contrast nephropathy. Limit dye. No ventriculogram. Schedule echocardiogram to assess LV function. He has mild chest pain at present but electrocardiogram is normal. We will treat with IV heparin and cycle enzymes.  2 coronary artery disease-continue aspirin and statin.  3 hypertension-continue preadmission medications.  4 chronic stage [name] kidney disease-plan as outlined above. Follow renal function closely after procedure.  Kirk Ruths, MD

## 2016-06-27 NOTE — Telephone Encounter (Signed)
Pt states he started his Ranexa yesterday as prescribed.  Woke up during the night sweating profusely and had chest discomfort.  States this has happened 2-3 times before.  States he took Nitro last night with relief.  Chest discomfort returned this morning and he took another Nitro at 0830 with relief.  Chest discomfort and another Nitro was taken at 10:30A with relief.  Chest discomfort returning now so pt decided to call since this would be the third Nitro he has taken today.  States he has been having chest discomfort on and off for 8 months.  Denies sharp pain, HA, dizziness or lightheadedness.  Pt states discomfort is more like an ache in his sternum.  Had pt take vitals while we were on the phone- Left arm- 161/91, 148/86, 139/84, HR 81.  Had pt take in right arm and he got 146/79, HR 79.  Pt has also had increased stress recently due to wife being hospitalized.  Spoke with Dr. Tamala Julian and he said to have pt come to the ER to be admitted and possible cath tomorrow.  Spoke with pt and made him aware of recommendations per Dr. Tamala Julian.  Pt verbalized understanding and was in agreement with this plan.  Paged Trish at [facility] ER and made her aware of pt coming to ER.

## 2016-06-27 NOTE — Telephone Encounter (Signed)
New message      Pt was seen yesterday. He got a sample of ranexa while in the office.  He is calling to give a 24hr report.  Pt took dosage last night and one this am.  Sometimes last night , pt had a very heavy sweating episode.  Sheets wet this am.  Pt had chest discomfort this am.  He took a nitro---chest discomfort went away for about 1-1.5 hrs.  It came back and pt took another nitro.  This time chest pain was gone for approx 4 hrs.  Chest discomfort is beginning to come back (15 mins ago).  Can he take another nitro making it 3 nitro's in one day

## 2016-06-28 ENCOUNTER — Ambulatory Visit (HOSPITAL_COMMUNITY)
Admission: RE | Admit: 2016-06-28 | Payer: Medicare Other | Source: Ambulatory Visit | Admitting: Interventional Cardiology

## 2016-06-28 ENCOUNTER — Encounter (HOSPITAL_COMMUNITY): Admission: EM | Disposition: A | Discharge status: Home / Self Care | Payer: Self-pay | Source: Home / Self Care

## 2016-06-28 DIAGNOSIS — N183 Chronic kidney disease, stage 3 (moderate): Secondary | ICD-10-CM

## 2016-06-28 DIAGNOSIS — I25110 Atherosclerotic heart disease of native coronary artery with unstable angina pectoris: Principal | ICD-10-CM

## 2016-06-28 DIAGNOSIS — I200 Unstable angina: Secondary | ICD-10-CM | POA: Diagnosis not present

## 2016-06-28 HISTORY — PX: LEFT HEART CATH AND CORONARY ANGIOGRAPHY: CATH118249

## 2016-06-28 LAB — BASIC METABOLIC PANEL
Anion gap: 9 (ref 5–15)
BUN: 29 mg/dL — ABNORMAL HIGH (ref 6–20)
CO2: 27 mmol/L (ref 22–32)
CREATININE: 1.7 mg/dL — AB (ref 0.61–1.24)
Calcium: 9 mg/dL (ref 8.9–10.3)
Chloride: 104 mmol/L (ref 101–111)
GFR calc non Af Amer: 37 mL/min — ABNORMAL LOW (ref >60)
GFR, EST AFRICAN AMERICAN: 42 mL/min — AB (ref >60)
Glucose, Bld: 115 mg/dL — ABNORMAL HIGH (ref 65–99)
Potassium: 3.9 mmol/L (ref 3.5–5.1)
SODIUM: 140 mmol/L (ref 135–145)

## 2016-06-28 LAB — LIPID PANEL
CHOL/HDL RATIO: 4.1 ratio
Cholesterol: 123 mg/dL (ref 0–200)
HDL: 30 mg/dL — AB (ref >40)
LDL Cholesterol: 71 mg/dL (ref 0–99)
Triglycerides: 112 mg/dL (ref <150)
VLDL: 22 mg/dL (ref 0–40)

## 2016-06-28 LAB — CBC
HCT: 35 % — ABNORMAL LOW (ref 39.0–52.0)
Hemoglobin: 11.9 g/dL — ABNORMAL LOW (ref 13.0–17.0)
MCH: 29.4 pg (ref 26.0–34.0)
MCHC: 34 g/dL (ref 30.0–36.0)
MCV: 86.4 fL (ref 78.0–100.0)
PLATELETS: 262 10*3/uL (ref 150–400)
RBC: 4.05 MIL/uL — AB (ref 4.22–5.81)
RDW: 13.6 % (ref 11.5–15.5)
WBC: 5.5 10*3/uL (ref 4.0–10.5)

## 2016-06-28 LAB — MRSA PCR SCREENING: MRSA BY PCR: NEGATIVE

## 2016-06-28 LAB — GLUCOSE, CAPILLARY
GLUCOSE-CAPILLARY: 170 mg/dL — AB (ref 65–99)
GLUCOSE-CAPILLARY: 190 mg/dL — AB (ref 65–99)
Glucose-Capillary: 158 mg/dL — ABNORMAL HIGH (ref 65–99)
Glucose-Capillary: 143 mg/dL — ABNORMAL HIGH (ref 65–99)

## 2016-06-28 LAB — HEPARIN LEVEL (UNFRACTIONATED): Heparin Unfractionated: 0.41 IU/mL (ref 0.30–0.70)

## 2016-06-28 LAB — TROPONIN I
Troponin I: <0.03 ng/mL (ref <0.03)
Troponin I: <0.03 ng/mL (ref <0.03)

## 2016-06-28 SURGERY — LEFT HEART CATH AND CORONARY ANGIOGRAPHY
Anesthesia: LOCAL

## 2016-06-28 MED ORDER — AMLODIPINE BESYLATE 5 MG PO TABS
5.0000 mg | ORAL_TABLET | Freq: Every day | ORAL | Status: DC
  Administered 2016-06-28 – 2016-06-29 (×2): 5 mg via ORAL
  Filled 2016-06-28 (×2): qty 1

## 2016-06-28 MED ORDER — FENTANYL CITRATE (PF) 100 MCG/2ML IJ SOLN
INTRAMUSCULAR | Status: AC
  Filled 2016-06-28: qty 2

## 2016-06-28 MED ORDER — HEPARIN SODIUM (PORCINE) 5000 UNIT/ML IJ SOLN
5000.0000 [IU] | Freq: Three times a day (TID) | INTRAMUSCULAR | Status: DC
  Administered 2016-06-29: 5000 [IU] via SUBCUTANEOUS
  Filled 2016-06-28 (×2): qty 1

## 2016-06-28 MED ORDER — NITROGLYCERIN 1 MG/10 ML FOR IR/CATH LAB
INTRA_ARTERIAL | Status: AC
  Filled 2016-06-28: qty 10

## 2016-06-28 MED ORDER — MIDAZOLAM HCL 2 MG/2ML IJ SOLN
INTRAMUSCULAR | Status: DC | PRN
Start: 2016-06-28 — End: 2016-06-28
  Administered 2016-06-28 (×2): 1 mg via INTRAVENOUS

## 2016-06-28 MED ORDER — HEPARIN (PORCINE) IN NACL 2-0.9 UNIT/ML-% IJ SOLN
INTRAMUSCULAR | Status: AC
  Filled 2016-06-28: qty 1000

## 2016-06-28 MED ORDER — MIDAZOLAM HCL 2 MG/2ML IJ SOLN
INTRAMUSCULAR | Status: AC
  Filled 2016-06-28: qty 2

## 2016-06-28 MED ORDER — VERAPAMIL HCL 2.5 MG/ML IV SOLN
INTRAVENOUS | Status: AC
  Filled 2016-06-28: qty 2

## 2016-06-28 MED ORDER — GLIPIZIDE 5 MG PO TABS
5.0000 mg | ORAL_TABLET | Freq: Every day | ORAL | Status: DC
  Administered 2016-06-29: 5 mg via ORAL
  Filled 2016-06-28: qty 1

## 2016-06-28 MED ORDER — ISOSORBIDE MONONITRATE ER 60 MG PO TB24
120.0000 mg | ORAL_TABLET | Freq: Every day | ORAL | Status: DC
  Administered 2016-06-28 – 2016-06-29 (×2): 120 mg via ORAL
  Filled 2016-06-28: qty 2

## 2016-06-28 MED ORDER — HEPARIN SODIUM (PORCINE) 1000 UNIT/ML IJ SOLN
INTRAMUSCULAR | Status: DC | PRN
  Administered 2016-06-28: 5000 [IU] via INTRAVENOUS

## 2016-06-28 MED ORDER — HYDRALAZINE HCL 20 MG/ML IJ SOLN
INTRAMUSCULAR | Status: AC
  Filled 2016-06-28: qty 1

## 2016-06-28 MED ORDER — LIDOCAINE HCL (PF) 1 % IJ SOLN
INTRAMUSCULAR | Status: DC | PRN
  Administered 2016-06-28: 2 mL via INTRADERMAL

## 2016-06-28 MED ORDER — HEPARIN (PORCINE) IN NACL 2-0.9 UNIT/ML-% IJ SOLN
INTRAMUSCULAR | Status: DC | PRN
  Administered 2016-06-28: 10 mL via INTRA_ARTERIAL

## 2016-06-28 MED ORDER — LIDOCAINE HCL (PF) 1 % IJ SOLN
INTRAMUSCULAR | Status: AC
  Filled 2016-06-28: qty 30

## 2016-06-28 MED ORDER — IOPAMIDOL (ISOVUE-370) INJECTION 76%
INTRAVENOUS | Status: AC
  Filled 2016-06-28: qty 100

## 2016-06-28 MED ORDER — HEPARIN SODIUM (PORCINE) 1000 UNIT/ML IJ SOLN
INTRAMUSCULAR | Status: AC
  Filled 2016-06-28: qty 1

## 2016-06-28 MED ORDER — IOPAMIDOL (ISOVUE-370) INJECTION 76%
INTRAVENOUS | Status: DC | PRN
  Administered 2016-06-28: 95 mL via INTRA_ARTERIAL

## 2016-06-28 MED ORDER — ISOSORBIDE MONONITRATE ER 60 MG PO TB24: 120.0000 mg | ORAL_TABLET | Freq: Every day | ORAL | Status: DC

## 2016-06-28 MED ORDER — NITROGLYCERIN 1 MG/10 ML FOR IR/CATH LAB
INTRA_ARTERIAL | Status: DC | PRN
  Administered 2016-06-28: 200 ug via INTRACORONARY

## 2016-06-28 MED ORDER — FENTANYL CITRATE (PF) 100 MCG/2ML IJ SOLN
INTRAMUSCULAR | Status: DC | PRN
  Administered 2016-06-28 (×2): 50 ug via INTRAVENOUS

## 2016-06-28 MED ORDER — HYDRALAZINE HCL 20 MG/ML IJ SOLN
INTRAMUSCULAR | Status: DC | PRN
  Administered 2016-06-28: 10 mg via INTRAVENOUS

## 2016-06-28 MED ORDER — HEPARIN (PORCINE) IN NACL 2-0.9 UNIT/ML-% IJ SOLN
INTRAMUSCULAR | Status: DC | PRN
  Administered 2016-06-28: 1000 mL

## 2016-06-28 SURGICAL SUPPLY — 12 items
CATH EXPO 5F FL3.5 (CATHETERS) ×1 IMPLANT
CATH EXPO 5FR FR4 (CATHETERS) ×1 IMPLANT
COVER PRB 48X5XTLSCP FOLD TPE (BAG) IMPLANT
COVER PROBE 5X48 (BAG) ×2
DEVICE RAD COMP TR BAND LRG (VASCULAR PRODUCTS) ×1 IMPLANT
GLIDESHEATH SLEND A-KIT 6F 22G (SHEATH) ×1 IMPLANT
GUIDEWIRE INQWIRE 1.5J.035X260 (WIRE) IMPLANT
INQWIRE 1.5J .035X260CM (WIRE) ×2
KIT HEART LEFT (KITS) ×2 IMPLANT
PACK CARDIAC CATHETERIZATION (CUSTOM PROCEDURE TRAY) ×2 IMPLANT
TRANSDUCER W/STOPCOCK (MISCELLANEOUS) ×2 IMPLANT
TUBING CIL FLEX 10 FLL-RA (TUBING) ×2 IMPLANT

## 2016-06-28 NOTE — Interval H&P Note (Signed)
History and Physical Interval Note:  06/28/2016 Cath Lab Visit (complete for each Cath Lab visit)  Clinical Evaluation Leading to the Procedure:   ACS: Yes.    Non-ACS:    Anginal Classification: CCS Acevedo  Anti-ischemic medical therapy: Maximal Therapy (2 or more classes of medications)  Non-Invasive Test Results: No non-invasive testing performed  Prior CABG: No previous CABG       4:19 PM  Marco Acevedo  has presented today for surgery, with the diagnosis of cp  The various methods of treatment have been discussed with the patient and family. After consideration of risks, benefits and other options for treatment, the patient has consented to  Procedure(s): Left Heart Cath and Coronary Angiography (N/A) as a surgical intervention .  The patient's history has been reviewed, patient examined, no change in status, stable for surgery.  I have reviewed the patient's chart and labs.  Questions were answered to the patient's satisfaction.     Marco Acevedo

## 2016-06-28 NOTE — Research (Signed)
Presented the [place] study (Svelte Stent) to patient and family. Family and patient had a lot of great questions. Reviewed Protocol and patient requirements. Patient not really sure if this is something that he has enough time to review/research. Family given ICF with my name and office number. They will review and speak with cardiologist. If it is something they wish to participate in they will call me.

## 2016-06-28 NOTE — Progress Notes (Signed)
[place] for Heparin Indication: chest pain/ACS  No Known Allergies  Patient Measurements: Height: 5\' 7"  (170.2 cm) Weight: 214 lb 8.1 oz (97.3 kg) IBW/kg (Calculated) : 66.1 Heparin Dosing Weight: 89.3 kg  Vital Signs: Temp: 98.5 F (36.9 C) (03/20 2300) Temp Source: Oral (03/20 2300) BP: 157/81 (03/20 2300) Pulse Rate: 74 (03/21 0200)  Labs:  Recent Labs  06/27/16 1712 06/27/16 2143 06/28/16 0414  HGB 12.8*  --  11.9*  HCT 37.1*  --  35.0*  PLT 309  --  262  LABPROT  --  13.7  --   INR  --  1.05  --   HEPARINUNFRC  --   --  0.41  CREATININE 1.98*  --  1.70*  TROPONINI  --  <0.03 <0.03    Estimated Creatinine Clearance: 39.2 mL/min (A) (by C-G formula based on SCr of 1.7 mg/dL (H)).  Assessment: 82 y.o. male with chest pain for heparin    Goal of Therapy:  Heparin level 0.3-0.7 units/ml Monitor platelets by anticoagulation protocol: Yes   Plan:  .Continue Heparin at current rate Follow up after cath today   Phillis Knack, PharmD, BCPS  06/28/2016

## 2016-06-28 NOTE — H&P (View-Only) (Signed)
Progress Note  Patient Name: Marco Acevedo Date of Encounter: 06/28/2016  Primary Cardiologist: Dr. Tamala Julian  Subjective   Pt denies SOB and palpitations. He had mild brief episodes of chest pain this morning. He is currently chest pain free.  Inpatient Medications    Scheduled Meds: . allopurinol  100 mg Oral Daily  . aspirin EC  81 mg Oral Daily  . famotidine  40 mg Oral QHS  . ferrous sulfate  325 mg Oral Daily  . finasteride  5 mg Oral Daily  . gemfibrozil  600 mg Oral QHS  . glyBURIDE  5 mg Oral BID WC  . guaiFENesin  800 mg Oral QHS  . insulin aspart  0-9 Units Subcutaneous TID WC  . isosorbide mononitrate  120 mg Oral Daily  . pravastatin  40 mg Oral Daily  . primidone  50 mg Oral QHS  . ranolazine  500 mg Oral BID  . sodium chloride flush  3 mL Intravenous Q12H  . sucralfate  1 g Oral BID  . tiotropium  18 mcg Inhalation Daily  . vitamin B-12  1,000 mcg Oral Daily   Continuous Infusions: . sodium chloride 1 mL/kg/hr (06/27/16 2222)  . heparin 1,150 Units/hr (06/27/16 2200)   PRN Meds: sodium chloride, acetaminophen, albuterol, nitroGLYCERIN, ondansetron (ZOFRAN) IV, sodium chloride flush   Vital Signs    Vitals:   06/28/16 0400 06/28/16 0410 06/28/16 0810 06/28/16 0856  BP: 140/72  (!) 154/86   Pulse: 67  70   Resp:   19   Temp: 98.5 F (36.9 C)  97.5 F (36.4 C)   TempSrc: Oral  Oral   SpO2: 99%  96% 98%  Weight:  214 lb 8.1 oz (97.3 kg)    Height:        Intake/Output Summary (Last 24 hours) at 06/28/16 0959 Last data filed at 06/28/16 0900  Gross per 24 hour  Intake          1221.84 ml  Output             1800 ml  Net          -578.16 ml   Filed Weights   06/27/16 2143 06/27/16 2300 06/28/16 0410  Weight: 215 lb 13.3 oz (97.9 kg) 214 lb 8.1 oz (97.3 kg) 214 lb 8.1 oz (97.3 kg)     Physical Exam   General: Well developed, well nourished, male appearing in no acute distress. Head: Normocephalic, atraumatic.  Neck: Supple without  bruits, no JVD Lungs:  Resp regular and unlabored, CTA. Heart: RRR, S1, S2, no murmur; no rub. Abdomen: Soft, non-tender, non-distended with normoactive bowel sounds. No hepatomegaly. No rebound/guarding. No obvious abdominal masses. Extremities: No clubbing, cyanosis, No edema. Distal pedal pulses are 2+ bilaterally. Neuro: Alert and oriented X 3. Moves all extremities spontaneously. Psych: Normal affect.  Labs    Chemistry Recent Labs Lab 06/27/16 1712 06/27/16 2143 06/28/16 0414  NA 139  --  140  K 4.5  --  3.9  CL 102  --  104  CO2 25  --  27  GLUCOSE 99  --  115*  BUN 33*  --  29*  CREATININE 1.98*  --  1.70*  CALCIUM 9.8  --  9.0  PROT  --  7.1  --   ALBUMIN  --  3.7  --   AST  --  24  --   ALT  --  19  --   ALKPHOS  --  77  --   BILITOT  --  0.8  --   GFRNONAA 30*  --  37*  GFRAA 35*  --  42*  ANIONGAP 12  --  9     Hematology Recent Labs Lab 06/27/16 1712 06/28/16 0414  WBC 6.5 5.5  RBC 4.27 4.05*  HGB 12.8* 11.9*  HCT 37.1* 35.0*  MCV 86.9 86.4  MCH 30.0 29.4  MCHC 34.5 34.0  RDW 13.7 13.6  PLT 309 262    Cardiac Enzymes Recent Labs Lab 06/27/16 2143 06/28/16 0414 06/28/16 0900  TROPONINI <0.03 <0.03 <0.03    Recent Labs Lab 06/27/16 1742  TROPIPOC 0.00     BNPNo results for input(s): BNP, PROBNP in the last 168 hours.   DDimer No results for input(s): DDIMER in the last 168 hours.   Radiology    Dg Chest 2 View  Result Date: 06/27/2016 CLINICAL DATA:  Center and slightly left chest pressure EXAM: CHEST  2 VIEW COMPARISON:  11/02/2014 FINDINGS: No focal pulmonary infiltrate, consolidation, or pleural effusion. Cardiomediastinal silhouette within normal limits. No pneumothorax. Degenerative changes of the spine. IMPRESSION: No active cardiopulmonary disease. Electronically Signed   By: Donavan Foil M.D.   On: 06/27/2016 17:48     Telemetry    NSR - Personally Reviewed  ECG    No new tracings - Personally Reviewed   Cardiac  Studies   Echocardiogram 06/28/16: pending final read  Left Heart Cath: scheduled for today (06/28/16)  Patient Profile     82 y.o. male with CAD (prior Cx stenting in 2007 and 2012, DES to prox Cx 2014), chronic diastolic CHF, hyperlipidemia, HTN, obesity, OSA, DM, hiatal hernia, neuropathy, GERD, CKD stage [name] (last OP Cr 1.69) who presents to [facility] with increasing frequency of exertional chest pain.  Assessment & Plan    1. Chest pain concerning for unstable angina with known history of CAD as above - plan for heart catheterization today - continue ranexa  2. Chronic diastolic CHF - TTE pending final read  3. Essential HTN - indur on hold  4. CKD stage [name] - IVF running - sCr today 1.70 (1.98)  5. Diabetes mellitus - continue SSI and PO meds  6. Hyperlipidemia - continue pravastatin    Signed, Ledora Bottcher , PA-C 9:59 AM 06/28/2016 Pager: [phone] As above; pt seen and examined; chest pain earlier now resolved; no dyspnea; enzymes negative; Cr improved to 1.7 compared to yesterday; for cath later today as previously outlined; limit dye; no Vgram; follow renal function closely following procedure. Echo to assess LV function.  Kirk Ruths, MD

## 2016-06-28 NOTE — Progress Notes (Signed)
Progress Note  Patient Name: Marco Acevedo Date of Encounter: 06/28/2016  Primary Cardiologist: Dr. Tamala Julian  Subjective   Pt denies SOB and palpitations. He had mild brief episodes of chest pain this morning. He is currently chest pain free.  Inpatient Medications    Scheduled Meds: . allopurinol  100 mg Oral Daily  . aspirin EC  81 mg Oral Daily  . famotidine  40 mg Oral QHS  . ferrous sulfate  325 mg Oral Daily  . finasteride  5 mg Oral Daily  . gemfibrozil  600 mg Oral QHS  . glyBURIDE  5 mg Oral BID WC  . guaiFENesin  800 mg Oral QHS  . insulin aspart  0-9 Units Subcutaneous TID WC  . isosorbide mononitrate  120 mg Oral Daily  . pravastatin  40 mg Oral Daily  . primidone  50 mg Oral QHS  . ranolazine  500 mg Oral BID  . sodium chloride flush  3 mL Intravenous Q12H  . sucralfate  1 g Oral BID  . tiotropium  18 mcg Inhalation Daily  . vitamin B-12  1,000 mcg Oral Daily   Continuous Infusions: . sodium chloride 1 mL/kg/hr (06/27/16 2222)  . heparin 1,150 Units/hr (06/27/16 2200)   PRN Meds: sodium chloride, acetaminophen, albuterol, nitroGLYCERIN, ondansetron (ZOFRAN) IV, sodium chloride flush   Vital Signs    Vitals:   06/28/16 0400 06/28/16 0410 06/28/16 0810 06/28/16 0856  BP: 140/72  (!) 154/86   Pulse: 67  70   Resp:   19   Temp: 98.5 F (36.9 C)  97.5 F (36.4 C)   TempSrc: Oral  Oral   SpO2: 99%  96% 98%  Weight:  214 lb 8.1 oz (97.3 kg)    Height:        Intake/Output Summary (Last 24 hours) at 06/28/16 0959 Last data filed at 06/28/16 0900  Gross per 24 hour  Intake          1221.84 ml  Output             1800 ml  Net          -578.16 ml   Filed Weights   06/27/16 2143 06/27/16 2300 06/28/16 0410  Weight: 215 lb 13.3 oz (97.9 kg) 214 lb 8.1 oz (97.3 kg) 214 lb 8.1 oz (97.3 kg)     Physical Exam   General: Well developed, well nourished, male appearing in no acute distress. Head: Normocephalic, atraumatic.  Neck: Supple without  bruits, no JVD Lungs:  Resp regular and unlabored, CTA. Heart: RRR, S1, S2, no murmur; no rub. Abdomen: Soft, non-tender, non-distended with normoactive bowel sounds. No hepatomegaly. No rebound/guarding. No obvious abdominal masses. Extremities: No clubbing, cyanosis, No edema. Distal pedal pulses are 2+ bilaterally. Neuro: Alert and oriented X 3. Moves all extremities spontaneously. Psych: Normal affect.  Labs    Chemistry Recent Labs Lab 06/27/16 1712 06/27/16 2143 06/28/16 0414  NA 139  --  140  K 4.5  --  3.9  CL 102  --  104  CO2 25  --  27  GLUCOSE 99  --  115*  BUN 33*  --  29*  CREATININE 1.98*  --  1.70*  CALCIUM 9.8  --  9.0  PROT  --  7.1  --   ALBUMIN  --  3.7  --   AST  --  24  --   ALT  --  19  --   ALKPHOS  --  77  --   BILITOT  --  0.8  --   GFRNONAA 30*  --  37*  GFRAA 35*  --  42*  ANIONGAP 12  --  9     Hematology Recent Labs Lab 06/27/16 1712 06/28/16 0414  WBC 6.5 5.5  RBC 4.27 4.05*  HGB 12.8* 11.9*  HCT 37.1* 35.0*  MCV 86.9 86.4  MCH 30.0 29.4  MCHC 34.5 34.0  RDW 13.7 13.6  PLT 309 262    Cardiac Enzymes Recent Labs Lab 06/27/16 2143 06/28/16 0414 06/28/16 0900  TROPONINI <0.03 <0.03 <0.03    Recent Labs Lab 06/27/16 1742  TROPIPOC 0.00     BNPNo results for input(s): BNP, PROBNP in the last 168 hours.   DDimer No results for input(s): DDIMER in the last 168 hours.   Radiology    Dg Chest 2 View  Result Date: 06/27/2016 CLINICAL DATA:  Center and slightly left chest pressure EXAM: CHEST  2 VIEW COMPARISON:  11/02/2014 FINDINGS: No focal pulmonary infiltrate, consolidation, or pleural effusion. Cardiomediastinal silhouette within normal limits. No pneumothorax. Degenerative changes of the spine. IMPRESSION: No active cardiopulmonary disease. Electronically Signed   By: Donavan Foil M.D.   On: 06/27/2016 17:48     Telemetry    NSR - Personally Reviewed  ECG    No new tracings - Personally Reviewed   Cardiac  Studies   Echocardiogram 06/28/16: pending final read  Left Heart Cath: scheduled for today (06/28/16)  Patient Profile     82 y.o. male with CAD (prior Cx stenting in 2007 and 2012, DES to prox Cx 2014), chronic diastolic CHF, hyperlipidemia, HTN, obesity, OSA, DM, hiatal hernia, neuropathy, GERD, CKD stage [name] (last OP Cr 1.69) who presents to [facility] with increasing frequency of exertional chest pain.  Assessment & Plan    1. Chest pain concerning for unstable angina with known history of CAD as above - plan for heart catheterization today - continue ranexa  2. Chronic diastolic CHF - TTE pending final read  3. Essential HTN - indur on hold  4. CKD stage [name] - IVF running - sCr today 1.70 (1.98)  5. Diabetes mellitus - continue SSI and PO meds  6. Hyperlipidemia - continue pravastatin    Signed, Ledora Bottcher , PA-C 9:59 AM 06/28/2016 Pager: [phone] As above; pt seen and examined; chest pain earlier now resolved; no dyspnea; enzymes negative; Cr improved to 1.7 compared to yesterday; for cath later today as previously outlined; limit dye; no Vgram; follow renal function closely following procedure. Echo to assess LV function.  Kirk Ruths, MD

## 2016-06-29 ENCOUNTER — Other Ambulatory Visit: Payer: Self-pay | Admitting: Physician Assistant

## 2016-06-29 ENCOUNTER — Observation Stay (HOSPITAL_COMMUNITY): Payer: Medicare Other

## 2016-06-29 ENCOUNTER — Encounter (HOSPITAL_COMMUNITY): Payer: Self-pay | Admitting: Interventional Cardiology

## 2016-06-29 DIAGNOSIS — N183 Chronic kidney disease, stage 3 unspecified: Secondary | ICD-10-CM

## 2016-06-29 DIAGNOSIS — I200 Unstable angina: Secondary | ICD-10-CM | POA: Diagnosis not present

## 2016-06-29 DIAGNOSIS — I25110 Atherosclerotic heart disease of native coronary artery with unstable angina pectoris: Secondary | ICD-10-CM | POA: Diagnosis not present

## 2016-06-29 DIAGNOSIS — I259 Chronic ischemic heart disease, unspecified: Secondary | ICD-10-CM

## 2016-06-29 LAB — CBC
HCT: 34.8 % — ABNORMAL LOW (ref 39.0–52.0)
Hemoglobin: 12 g/dL — ABNORMAL LOW (ref 13.0–17.0)
MCH: 29.8 pg (ref 26.0–34.0)
MCHC: 34.5 g/dL (ref 30.0–36.0)
MCV: 86.4 fL (ref 78.0–100.0)
PLATELETS: 280 10*3/uL (ref 150–400)
RBC: 4.03 MIL/uL — ABNORMAL LOW (ref 4.22–5.81)
RDW: 13.6 % (ref 11.5–15.5)
WBC: 6 10*3/uL (ref 4.0–10.5)

## 2016-06-29 LAB — BASIC METABOLIC PANEL
ANION GAP: 12 (ref 5–15)
BUN: 22 mg/dL — AB (ref 6–20)
CALCIUM: 8.9 mg/dL (ref 8.9–10.3)
CO2: 23 mmol/L (ref 22–32)
Chloride: 102 mmol/L (ref 101–111)
Creatinine, Ser: 1.59 mg/dL — ABNORMAL HIGH (ref 0.61–1.24)
GFR calc Af Amer: 46 mL/min — ABNORMAL LOW (ref >60)
GFR calc non Af Amer: 40 mL/min — ABNORMAL LOW (ref >60)
GLUCOSE: 113 mg/dL — AB (ref 65–99)
POTASSIUM: 3.5 mmol/L (ref 3.5–5.1)
SODIUM: 137 mmol/L (ref 135–145)

## 2016-06-29 LAB — GLUCOSE, CAPILLARY: GLUCOSE-CAPILLARY: 132 mg/dL — AB (ref 65–99)

## 2016-06-29 MED ORDER — AMLODIPINE BESYLATE 2.5 MG PO TABS: 2.5000 mg | ORAL_TABLET | Freq: Every day | ORAL | 11 refills | Status: DC

## 2016-06-29 MED ORDER — AMLODIPINE BESYLATE 2.5 MG PO TABS: 2.5000 mg | ORAL_TABLET | Freq: Every day | ORAL | 3 refills | Status: DC

## 2016-06-29 MED ORDER — GLIPIZIDE 5 MG PO TABS: 5.0000 mg | ORAL_TABLET | Freq: Every day | ORAL | 0 refills | Status: DC

## 2016-06-29 NOTE — Significant Event (Signed)
Patient ambulated in the halls with RN, walked approximately 650 feet, on room air, HR 90s, Saturations 95% during ambulation. Patient returned to room and settled in the recliner to visit with family.     Marco Acevedo

## 2016-06-29 NOTE — Discharge Summary (Signed)
Discharge Summary    Patient ID: Marco Acevedo,  MRN: [id number], DOB/AGE: [birth year] 82 y.o.  Admit date: 06/27/2016 Discharge date: 06/29/2016  Primary Care Provider: Melinda Crutch Primary Cardiologist: Dr Tamala Julian  Discharge Diagnoses    Principal Problem:   Intermediate coronary syndrome [facility]) Active Problems:   COPD (chronic obstructive pulmonary disease) ([place])   Coronary artery disease involving native coronary artery of native heart with angina pectoris (HCC)   CKD (chronic kidney disease) stage 3, GFR 30-59 ml/min  Allergies No Known Allergies  Diagnostic Studies/Procedures     CARDIAC CATH: 06/28/2016  No major change since the post PCI angiogram in 2014.   There is calcification with globular proximal LAD 40% stenosis.  The circumflex ostium contains eccentric 40% narrowing followed by proximal circumflex stent which contains up to 50% ISR. The non-stented mid segment contains diffuse 50-60% narrowing. The distal first obtuse marginal stent contains 50-60% proximal edge in-stent restenosis.  The right coronary contains diffuse disease up to 50% throughout the proximal mid and distal segment.   Left ventriculography was not performed because of kidney impairment. LVEDP was markedly elevated at 27 mmHg. RECOMMENDATIONS:  Aggressive blood pressure control. Add amlodipine 5 mg daily. May need low-dose diuretic therapy  Sublingual nitroglycerin as needed to control angina. Continue Ranexa as previously prescribed.  Clinical follow-up in 7-10 days.  Follow kidney function closely after contrast exposure, 95 cc used.  _____________   History of Present Illness     82 y.o. male with history of CAD (prior Cx stenting in 2007 and 2012, DES to prox Cx 2014), chronic diastolic CHF, hyperlipidemia, HTN, obesity, OSA, DM, hiatal hernia, neuropathy, GERD, CKD stage [name] (last OP Cr 1.69) was admitted 03/20 with chest pain.  Hospital Course     Consultants: none    Cardiac enzymes were negative for MI. He was evaluated and his symptoms were concerning for unstable angina. He was taken to the cath lab on 06/28/2016.  Cardiac cath results are above. Medical therapy is recommended for CAD, LVEDP was elevated at cath. He is to watch the sodium and fluid in his diet very carefully. Because of his renal insufficiency, no LV gram was performed. He is to get an echocardiogram.   On 03/22, pt was seen by Dr Stanford Breed and all data were reviewed. Dr Stanford Breed noted that a diuretic could be started at his follow up appointment if renal function is stable.   Upon questioning, pt admits that he drinks at least a gallon of liquids daily, trying to maintain his kidney function. He was encouraged to limit fluids to 2 quarts daily and limit sodium to 2000 mg daily. He will be given information on a low sodium diabetic diet.   He admits that he does not stick tightly to a diabetic diet. He is encouraged to be compliant with dietary restrictions and follow his sugars. Because of his renal insufficiency, the pharmacy team recommended changing his glyburide to glipizide. This was done. He is to follow up with Dr Harrington Challenger for diabetes management.  Mr Akel was ambulating without chest pain or SOB. His cath site was without ecchymosis or hematoma. No further in-patient workup was indicated and he is considered stable for discharge, to follow up as an outpatient.   _____________  Discharge Vitals Blood pressure (!) 146/82, pulse 81, temperature 97.4 F (36.3 C), temperature source Oral, resp. rate 17, height 5\' 7"  (1.702 m), weight 211 lb 10.3 oz (96 kg), SpO2 95 %.  Filed Weights   06/27/16 2300 06/28/16 0410 06/29/16 0255  Weight: 214 lb 8.1 oz (97.3 kg) 214 lb 8.1 oz (97.3 kg) 211 lb 10.3 oz (96 kg)    Labs & Radiologic Studies    CBC  Recent Labs  06/28/16 0414 06/29/16 0219  WBC 5.5 6.0  HGB 11.9* 12.0*  HCT 35.0* 34.8*  MCV 86.4 86.4  PLT 262 387   Basic  Metabolic Panel  Recent Labs  06/28/16 0414 06/29/16 0219  NA 140 137  K 3.9 3.5  CL 104 102  CO2 27 23  GLUCOSE 115* 113*  BUN 29* 22*  CREATININE 1.70* 1.59*  CALCIUM 9.0 8.9   Liver Function Tests  Recent Labs  06/27/16 2143  AST 24  ALT 19  ALKPHOS 77  BILITOT 0.8  PROT 7.1  ALBUMIN 3.7   Cardiac Enzymes  Recent Labs  06/27/16 2143 06/28/16 0414 06/28/16 0900  TROPONINI <0.03 <0.03 <0.03   Fasting Lipid Panel  Recent Labs  06/28/16 0414  CHOL 123  HDL 30*  LDLCALC 71  TRIG 112  CHOLHDL 4.1   Thyroid Function Tests  Recent Labs  06/27/16 2143  TSH 1.284   _____________  Dg Chest 2 View Result Date: 06/27/2016 CLINICAL DATA:  Center and slightly left chest pressure EXAM: CHEST  2 VIEW COMPARISON:  11/02/2014 FINDINGS: No focal pulmonary infiltrate, consolidation, or pleural effusion. Cardiomediastinal silhouette within normal limits. No pneumothorax. Degenerative changes of the spine. IMPRESSION: No active cardiopulmonary disease. Electronically Signed   By: Donavan Foil M.D.   On: 06/27/2016 17:48   Disposition   Pt is being discharged home today in good condition.  Follow-up Plans & Appointments    Follow-up Information    Sinclair Grooms, MD Follow up on 07/18/2016.   Specialty:  Cardiology Why:  10:20 am Contact information: [address] [place] [address] [phone]        [facility] Follow up on 07/13/2016.   Why:  ECHOCARDIOGRAM Please arrive at 2:45 pm for a 3:00 pm appointment. Contact information: [place] [address] [phone]       Melinda Crutch, MD. Schedule an appointment as soon as possible for a visit.   Specialty:  Family Medicine Why:  Your glyburide was changed to glipizide because of your kidney function. Please call Dr Harrington Challenger for a followup appointment to further manage your diabetes.  Contact  information: [place] [address] [phone]          Discharge Instructions    Diet - low sodium heart healthy    Complete by:  As directed    Diet Carb Modified    Complete by:  As directed    Increase activity slowly    Complete by:  As directed       Discharge Medications   Current Discharge Medication List    START taking these medications   Details  amLODipine (NORVASC) 2.5 MG tablet Take 1 tablet (2.5 mg total) by mouth daily. Qty: 30 tablet, Refills: 11    glipiZIDE (GLUCOTROL) 5 MG tablet Take 1 tablet (5 mg total) by mouth daily before breakfast. Qty: 30 tablet, Refills: 0      CONTINUE these medications which have NOT CHANGED   Details  albuterol (PROVENTIL HFA) 108 (90 BASE) MCG/ACT inhaler Inhale 2 puffs into the lungs every 6 (six) hours as needed for wheezing or shortness of  breath.     albuterol (PROVENTIL) (2.5 MG/3ML) 0.083% nebulizer solution Take 2.5 mg by nebulization every 6 (six) hours as needed for wheezing or shortness of breath.     allopurinol (ZYLOPRIM) 100 MG tablet Take 100 mg by mouth daily.    aspirin EC 81 MG tablet Take 81 mg by mouth daily.    b complex vitamins capsule Take 1 capsule by mouth every evening.     Ferrous Sulfate (IRON) 325 (65 FE) MG TABS Take 1 tablet by mouth daily.    finasteride (PROSCAR) 5 MG tablet Take 1 tablet (5 mg total) by mouth daily. Qty: 30 tablet, Refills: 5    gemfibrozil (LOPID) 600 MG tablet Take 600 mg by mouth at bedtime.     guaifenesin (HUMIBID E) 400 MG TABS tablet Take 400 mg by mouth at bedtime.     isosorbide mononitrate (IMDUR) 120 MG 24 hr tablet Take 1 tablet (120 mg total) by mouth daily. Qty: 90 tablet, Refills: 3   Associated Diagnoses: Preop cardiovascular exam; Atherosclerosis of native coronary artery with other form of angina pectoris    nitroGLYCERIN (NITROSTAT) 0.4 MG SL tablet Place 0.4 mg under the tongue every 5 (five) minutes as needed for chest  pain.    pravastatin (PRAVACHOL) 40 MG tablet Take 40 mg by mouth daily.    primidone (MYSOLINE) 50 MG tablet Take 50 mg by mouth at bedtime.     ranitidine (ZANTAC) 300 MG tablet Take 300 mg by mouth at bedtime.    ranolazine (RANEXA) 500 MG 12 hr tablet Take 1 tablet (500 mg total) by mouth 2 (two) times daily. Qty: 60 tablet, Refills: 11    sucralfate (CARAFATE) 1 GM/10ML suspension Take 1 g by mouth 2 (two) times daily.    tiotropium (SPIRIVA) 18 MCG inhalation capsule Place 18 mcg into inhaler and inhale every morning.     vitamin B-12 (CYANOCOBALAMIN) 1000 MCG tablet Take 1,000 mcg by mouth daily.      STOP taking these medications     glyBURIDE (DIABETA) 5 MG tablet           Outstanding Labs/Studies   None  Duration of Discharge Encounter   Greater than 30 minutes including physician time.  Jonetta Speak NP 06/29/2016, 10:28 AM

## 2016-06-29 NOTE — Care Management Obs Status (Signed)
[facility] NOTIFICATION   Patient Details  Name: VI WHITESEL MRN: [id number] Date of Birth: 04/11/1934   Medicare Observation Status Notification Given:  Yes    Maryclare Labrador, RN 06/29/2016, 10:37 AM

## 2016-06-29 NOTE — Care Management Note (Signed)
Case Management Note  Patient Details  Name: Marco Acevedo MRN: [id number] Date of Birth: 09/12/1934  Subjective/Objective:    Pt admitted with [facility]                Action/Plan:   PTA independent from home with wife.  Pt denied barriers to returning home, has active relationship with PCP and is able to obtain medications as prescribed.  No CM needs identified prior to discharge   Expected Discharge Date:  06/29/16               Expected Discharge Plan:  Home/Self Care  In-House Referral:     Discharge planning Services  CM Consult  Post Acute Care Choice:    Choice offered to:     DME Arranged:    DME Agency:     HH Arranged:    [place] Agency:     Status of Service:  Completed, signed off  If discussed at [employer] of Stay Meetings, dates discussed:    Additional Comments:  Maryclare Labrador, RN 06/29/2016, 10:38 AM

## 2016-06-29 NOTE — Progress Notes (Signed)
Dc instructions given to pt at this time.  Pt verbalized understanding of all instructions.  No s/s of any acute distress at this time.  No c/o pain.  Wife and dtr here to transport pt to home.

## 2016-06-29 NOTE — Progress Notes (Signed)
Progress Note  Patient Name: Marco Acevedo Date of Encounter: 06/29/2016  Primary Cardiologist: Dr. Tamala Julian  Subjective   "Twinge" of chest pain earlier for 1-2 seconds; no dyspnea  Inpatient Medications    Scheduled Meds: . allopurinol  100 mg Oral Daily  . amLODipine  5 mg Oral Daily  . aspirin EC  81 mg Oral Daily  . famotidine  40 mg Oral QHS  . ferrous sulfate  325 mg Oral Daily  . finasteride  5 mg Oral Daily  . gemfibrozil  600 mg Oral QHS  . glipiZIDE  5 mg Oral QAC breakfast  . guaiFENesin  800 mg Oral QHS  . heparin subcutaneous  5,000 Units Subcutaneous Q8H  . insulin aspart  0-9 Units Subcutaneous TID WC  . isosorbide mononitrate  120 mg Oral Daily  . pravastatin  40 mg Oral Daily  . primidone  50 mg Oral QHS  . ranolazine  500 mg Oral BID  . sodium chloride flush  3 mL Intravenous Q12H  . sucralfate  1 g Oral BID  . tiotropium  18 mcg Inhalation Daily  . vitamin B-12  1,000 mcg Oral Daily   Continuous Infusions:  PRN Meds: sodium chloride, acetaminophen, albuterol, nitroGLYCERIN, ondansetron (ZOFRAN) IV, sodium chloride flush   Vital Signs    Vitals:   06/28/16 2200 06/28/16 2300 06/29/16 0255 06/29/16 0820  BP: 137/74 121/66 (!) 90/54 132/81  Pulse: 83 76 70 77  Resp: 17 16 16 12   Temp:  98 F (36.7 C) 97.9 F (36.6 C) 97.4 F (36.3 C)  TempSrc:  Oral Oral Oral  SpO2: 93% 90% 94% 99%  Weight:   211 lb 10.3 oz (96 kg)   Height:        Intake/Output Summary (Last 24 hours) at 06/29/16 0826 Last data filed at 06/28/16 2100  Gross per 24 hour  Intake           843.48 ml  Output             3025 ml  Net         -2181.52 ml   Filed Weights   06/27/16 2300 06/28/16 0410 06/29/16 0255  Weight: 214 lb 8.1 oz (97.3 kg) 214 lb 8.1 oz (97.3 kg) 211 lb 10.3 oz (96 kg)     Physical Exam   General: Well developed, obese male appearing in no acute distress. Head: Normal Neck: Supple Lungs:  CTA. Heart: RRR Abdomen: Soft, non-tender,  non-distended Extremities: No edema. Radial cath site with no hematoma Neuro: Grossly intact  Labs    Chemistry  Recent Labs Lab 06/27/16 1712 06/27/16 2143 06/28/16 0414 06/29/16 0219  NA 139  --  140 137  K 4.5  --  3.9 3.5  CL 102  --  104 102  CO2 25  --  27 23  GLUCOSE 99  --  115* 113*  BUN 33*  --  29* 22*  CREATININE 1.98*  --  1.70* 1.59*  CALCIUM 9.8  --  9.0 8.9  PROT  --  7.1  --   --   ALBUMIN  --  3.7  --   --   AST  --  24  --   --   ALT  --  19  --   --   ALKPHOS  --  77  --   --   BILITOT  --  0.8  --   --   GFRNONAA 30*  --  37* 40*  GFRAA 35*  --  42* 46*  ANIONGAP 12  --  9 12     Hematology  Recent Labs Lab 06/27/16 1712 06/28/16 0414 06/29/16 0219  WBC 6.5 5.5 6.0  RBC 4.27 4.05* 4.03*  HGB 12.8* 11.9* 12.0*  HCT 37.1* 35.0* 34.8*  MCV 86.9 86.4 86.4  MCH 30.0 29.4 29.8  MCHC 34.5 34.0 34.5  RDW 13.7 13.6 13.6  PLT 309 262 280    Cardiac Enzymes  Recent Labs Lab 06/27/16 2143 06/28/16 0414 06/28/16 0900  TROPONINI <0.03 <0.03 <0.03     Recent Labs Lab 06/27/16 1742  TROPIPOC 0.00     Radiology    Dg Chest 2 View  Result Date: 06/27/2016 CLINICAL DATA:  Center and slightly left chest pressure EXAM: CHEST  2 VIEW COMPARISON:  11/02/2014 FINDINGS: No focal pulmonary infiltrate, consolidation, or pleural effusion. Cardiomediastinal silhouette within normal limits. No pneumothorax. Degenerative changes of the spine. IMPRESSION: No active cardiopulmonary disease. Electronically Signed   By: Donavan Foil M.D.   On: 06/27/2016 17:48     Telemetry    NSR - Personally Reviewed  Patient Profile     82 y.o. male with CAD (prior Cx stenting in 2007 and 2012, DES to prox Cx 2014), chronic diastolic CHF, hyperlipidemia, HTN, obesity, OSA, DM, hiatal hernia, neuropathy, GERD, CKD stage [name] (last OP Cr 1.69) who presents to [facility] with increasing frequency of exertional chest pain.  Assessment & Plan    1. Chest pain  -Cath  results noted; moderate CAD; plan medical therapy; continue asa, statin, imdur; BP low last PM; decrease amlodipine to 2.5 mg daily.  2. Chronic diastolic CHF - Will plan echo to assess LV function as outpt; LVEDP elevated at time of cath; if renal function stable at fu would add low dose lasix.  3. Essential HTN -BP low yesterday; decrease amlodipine to 2.5 mg daily and follow.  4. CKD stage [name] - Cr stable this AM; recheck Monday 3/26  5. Diabetes mellitus - continue SSI and PO meds  6. Hyperlipidemia - continue pravastatin  DC today; check BMET 3/26; TOC appt one week; fu with Dr Tamala Julian 3 months.  > 30 min PA and physician time D2  Signed, Kirk Ruths , MD 8:26 AM 06/29/2016

## 2016-06-29 NOTE — Discharge Instructions (Signed)
PLEASE GO TO DR SMITH'S OFFICE ON [address] ON 03/26 FOR LAB WORK. Come any time between 7:30 and and 4:00 pm. Do not have to be fasting and can take all morning medications.   Limit fluids to 2 quarts a day.  Limit sodium to 2000 mg per day.  PLEASE REMEMBER TO BRING ALL OF YOUR MEDICATIONS TO EACH OF YOUR FOLLOW-UP OFFICE VISITS.  PLEASE ATTEND ALL SCHEDULED FOLLOW-UP APPOINTMENTS.   Activity: Increase activity slowly as tolerated. You may shower, but no soaking baths (or swimming) for 1 week. No driving for 2 days. No lifting over 5 lbs for 1 week. No sexual activity for 1 week.   Wound Care: You may wash cath site gently with soap and water. Keep cath site clean and dry. If you notice pain, swelling, bleeding or pus at your cath site, please call [phone].    Cardiac Cath Site Care Refer to this sheet in the next few weeks. These instructions provide you with information on caring for yourself after your procedure. Your caregiver may also give you more specific instructions. Your treatment has been planned according to current medical practices, but problems sometimes occur. Call your caregiver if you have any problems or questions after your procedure. HOME CARE INSTRUCTIONS  You may shower 24 hours after the procedure. Remove the bandage (dressing) and gently wash the site with plain soap and water. Gently pat the site dry.   Do not apply powder or lotion to the site.   Do not sit in a bathtub, swimming pool, or whirlpool for 5 to 7 days.   No bending, squatting, or lifting anything over 10 pounds (4.5 kg) as directed by your caregiver.   Inspect the site at least twice daily.   Do not drive home if you are discharged the same day of the procedure. Have someone else drive you.   You may drive 24 hours after the procedure unless otherwise instructed by your caregiver.  What to expect:  Any bruising will usually fade within 1 to 2 weeks.   Blood that collects in the  tissue (hematoma) may be painful to the touch. It should usually decrease in size and tenderness within 1 to 2 weeks.  SEEK IMMEDIATE MEDICAL CARE IF:  You have unusual pain at the site or down the affected limb.   You have redness, warmth, swelling, or pain at the site.   You have drainage (other than a small amount of blood on the dressing).   You have chills.   You have a fever or persistent symptoms for more than 72 hours.   You have a fever and your symptoms suddenly get worse.   Your leg becomes pale, cool, tingly, or numb.   You have heavy bleeding from the site. Hold pressure on the site.  Document Released: 04/29/2010 Document Revised: 03/16/2011 Document Reviewed: 04/29/2010 [facility] Patient Information 2012 [place].

## 2016-06-30 ENCOUNTER — Telehealth: Payer: Self-pay | Admitting: Interventional Cardiology

## 2016-06-30 DIAGNOSIS — Z7984 Long term (current) use of oral hypoglycemic drugs: Secondary | ICD-10-CM | POA: Diagnosis not present

## 2016-06-30 DIAGNOSIS — E1140 Type 2 diabetes mellitus with diabetic neuropathy, unspecified: Secondary | ICD-10-CM | POA: Diagnosis not present

## 2016-06-30 DIAGNOSIS — I10 Essential (primary) hypertension: Secondary | ICD-10-CM | POA: Diagnosis not present

## 2016-06-30 NOTE — Research (Signed)
OPTIMIZE Informed Consent   Subject Name: Marco Acevedo  Subject met inclusion and exclusion criteria.  The informed consent form, study requirements and expectations were reviewed with the subject and questions and concerns were addressed prior to the signing of the consent form.  The subject verbalized understanding of the trail requirements.  The subject agreed to participate in the OPTIMIZE trial and signed the informed consent.  The informed consent was obtained prior to performance of any protocol-specific procedures for the subject.  A copy of the signed informed consent was given to the subject and a copy was placed in the subject's medical record. Patient did not meet Inclusion and Exclusion Criteria.  [name],[name] 06/28/2016 1610

## 2016-06-30 NOTE — Telephone Encounter (Signed)
New message    Pt is calling asking about his water intake because he was told he has water around his heart. He also has a question about his ranexa medication.

## 2016-06-30 NOTE — Telephone Encounter (Signed)
Spoke with pt and he wanted to know if he was to still take the Ranexa.  Advised that per his hospital d/c papers that he was to continue the Ranexa.  Pt also inquired about fluid intake. Advised hospital d/c says 2 qts per day.  Pt also made me aware that he is allergic to Zantac.  Pt has been taking Ranitidine.  He did not realize this was the generic for Zantac.  This causes panic attacks.  Advised pt I would add this to allergy list and d/c it from med list.  Pt appreciative for assistance.

## 2016-07-03 ENCOUNTER — Telehealth: Payer: Self-pay | Admitting: Interventional Cardiology

## 2016-07-03 ENCOUNTER — Other Ambulatory Visit: Payer: Medicare Other | Admitting: *Deleted

## 2016-07-03 DIAGNOSIS — N183 Chronic kidney disease, stage 3 unspecified: Secondary | ICD-10-CM

## 2016-07-03 NOTE — Addendum Note (Signed)
Addended by: Eulis Foster on: 07/03/2016 12:39 PM   Modules accepted: Orders

## 2016-07-03 NOTE — Telephone Encounter (Signed)
New message    Pt is calling to ask if it would be ok if he played golf on this Thursday? Pt states he did his blood work today.

## 2016-07-03 NOTE — Telephone Encounter (Signed)
Spoke with Dr. Tamala Julian and he said ok to golf on Thurs.  Spoke with pt and mae him aware that Dr. Tamala Julian said ok to golf on Thurs.  Pt appreciative for call.    Pt states that Ranexa will be $1200 every 3 months and he is unable to afford this.  Advised I would make Dr. Tamala Julian aware. Dr. Tamala Julian said ok to stop Ranexa.

## 2016-07-04 LAB — BASIC METABOLIC PANEL
BUN / CREAT RATIO: 16 (ref 10–24)
BUN: 32 mg/dL — ABNORMAL HIGH (ref 8–27)
CO2: 24 mmol/L (ref 18–29)
Calcium: 9.7 mg/dL (ref 8.6–10.2)
Chloride: 99 mmol/L (ref 96–106)
Creatinine, Ser: 2.03 mg/dL — ABNORMAL HIGH (ref 0.76–1.27)
GFR calc Af Amer: 35 mL/min/{1.73_m2} — ABNORMAL LOW (ref >59)
GFR, EST NON AFRICAN AMERICAN: 30 mL/min/{1.73_m2} — AB (ref >59)
Glucose: 205 mg/dL — ABNORMAL HIGH (ref 65–99)
Potassium: 4.7 mmol/L (ref 3.5–5.2)
Sodium: 139 mmol/L (ref 134–144)

## 2016-07-13 ENCOUNTER — Ambulatory Visit (HOSPITAL_COMMUNITY): Payer: Medicare Other | Attending: Cardiovascular Disease

## 2016-07-13 ENCOUNTER — Other Ambulatory Visit: Payer: Self-pay

## 2016-07-13 DIAGNOSIS — I2510 Atherosclerotic heart disease of native coronary artery without angina pectoris: Secondary | ICD-10-CM | POA: Diagnosis not present

## 2016-07-13 DIAGNOSIS — I119 Hypertensive heart disease without heart failure: Secondary | ICD-10-CM | POA: Diagnosis not present

## 2016-07-13 DIAGNOSIS — J449 Chronic obstructive pulmonary disease, unspecified: Secondary | ICD-10-CM | POA: Diagnosis not present

## 2016-07-13 DIAGNOSIS — E785 Hyperlipidemia, unspecified: Secondary | ICD-10-CM | POA: Insufficient documentation

## 2016-07-13 DIAGNOSIS — I350 Nonrheumatic aortic (valve) stenosis: Secondary | ICD-10-CM | POA: Diagnosis not present

## 2016-07-13 DIAGNOSIS — I209 Angina pectoris, unspecified: Secondary | ICD-10-CM | POA: Diagnosis not present

## 2016-07-13 DIAGNOSIS — I259 Chronic ischemic heart disease, unspecified: Secondary | ICD-10-CM

## 2016-07-13 DIAGNOSIS — E1140 Type 2 diabetes mellitus with diabetic neuropathy, unspecified: Secondary | ICD-10-CM | POA: Diagnosis not present

## 2016-07-14 ENCOUNTER — Telehealth: Payer: Self-pay | Admitting: Interventional Cardiology

## 2016-07-14 NOTE — Telephone Encounter (Signed)
New message   Pt states he had an echo and wants to know if he can go play golf with having a heart attack

## 2016-07-14 NOTE — Telephone Encounter (Signed)
Walk in pt Form-BP readings dropped off.placed in [employer].

## 2016-07-14 NOTE — Telephone Encounter (Signed)
Spoke with pt and he denies CP, states this has resolved.  Wt is down and pt is feeling great.  Advised ok to play golf as Dr. Tamala Julian had also said ok on 07/03/16 (see phone note).  Pt verbalized understanding and was appreciative for call.

## 2016-07-17 NOTE — Progress Notes (Signed)
Cardiology Office Note    Date:  07/17/2016   ID:  Marco Acevedo, DOB 1935-03-06, MRN [id number]  PCP:  Melinda Crutch, MD  Cardiologist: Sinclair Grooms, MD   No chief complaint on file.   History of Present Illness:  Marco Acevedo is a 82 y.o. male with CAD and prior circumflex stents, Chronic DHF, hyperlipidemia, hypertension, obesity and OSA.  Marco Acevedo is concerned that he may be developing kidney failure. His last creatinine was 2.03. Prior to cath 1.6. We did place him on fluid and salt restriction at discharge from the hospital. On 1000 mg twice a day of Ranexa, he noticed no improvement in symptoms. He cannot afford the medication. He has not had it in over one week and has not noticed any worsening of angina.    Past Medical History:  Diagnosis Date  . Anemia    low iron  . BPH (benign prostatic hyperplasia)   . Bruises easily   . CAD (coronary artery disease)    a. per Dr. Thompson Caul note, prior stenting of Cx in 2007 and 2012. b. DES to Cx in 09/2012.  [name] Chronic diastolic CHF (congestive heart failure) ([place])   . CKD (chronic kidney disease), stage [name]   . Claustrophobia   . Complication of anesthesia    has to have head elevated to keep from strangling on post nasal drip  . COPD (chronic obstructive pulmonary disease) ([place])   . Diabetes mellitus type II    type 2  . Diarrhea 2015   had for a year and a half  . GERD (gastroesophageal reflux disease)   . Gout   . Granuloma annulare   . History of hiatal hernia   . History of kidney stones   . Hyperlipidemia   . Hypertension   . Neuropathy ([place])   . Obesity   . OSA (obstructive sleep apnea)   . Wears dentures    top  . Wears glasses     Past Surgical History:  Procedure Laterality Date  . BLEPHAROPLASTY    . CARDIAC CATHETERIZATION  [phone]   X 2 stents  . CHOLECYSTECTOMY  09/23/2014  . CHOLECYSTECTOMY N/A 09/23/2014   Procedure: LAPAROSCOPIC CHOLECYSTECTOMY WITH INTRAOPERATIVE CHOLANGIOGRAM;  Surgeon: Autumn Messing [name], MD;  Location: [facility];  Service: General;  Laterality: N/A;  . COLONOSCOPY    . COLONOSCOPY WITH PROPOFOL N/A 03/22/2016   Procedure: COLONOSCOPY WITH PROPOFOL;  Surgeon: Wilford Corner, MD;  Location: [facility] ENDOSCOPY;  Service: Endoscopy;  Laterality: N/A;  . ESOPHAGOGASTRODUODENOSCOPY (EGD) WITH PROPOFOL N/A 03/22/2016   Procedure: ESOPHAGOGASTRODUODENOSCOPY (EGD) WITH PROPOFOL;  Surgeon: Wilford Corner, MD;  Location: [facility] ENDOSCOPY;  Service: Endoscopy;  Laterality: N/A;  . EYE SURGERY     both cataracts  . KNEE ARTHROSCOPY WITH MEDIAL MENISECTOMY Right 08/19/2013   Procedure: RIGHT KNEE ARTHROSCOPY WITH PARTIAL MEDIAL MENISECTOMY AND CHONDROPLASTY;  Surgeon: Hessie Dibble, MD;  Location: [facility];  Service: Orthopedics;  Laterality: Right;  . LEFT HEART CATH AND CORONARY ANGIOGRAPHY N/A 06/28/2016   Procedure: Left Heart Cath and Coronary Angiography;  Surgeon: Belva Crome, MD;  Location: [facility] CV LAB;  Service: Cardiovascular;  Laterality: N/A;  . LEFT HEART CATHETERIZATION WITH CORONARY ANGIOGRAM N/A 10/03/2011   Procedure: LEFT HEART CATHETERIZATION WITH CORONARY ANGIOGRAM;  Surgeon: Sinclair Grooms, MD;  Location[facility] C[facility] LAB;  Service: Cardiovascular;  Laterality: N/A;  . LEFT HEART CATHETERIZATION WITH CORONARY ANGIOGRAM N/A 09/12/2012   Procedure:  LEFT HEART CATHETERIZATION WITH CORONARY ANGIOGRAM;  Surgeon: Sinclair Grooms, MD;  Location: [facility] CATH LAB;  Service: Cardiovascular;  Laterality: N/A;  . LIPOMA EXCISION     Biospy only; left parotid gland  . none    . PERCUTANEOUS CORONARY STENT INTERVENTION (PCI-S) N/A 09/17/2012   Procedure: PERCUTANEOUS CORONARY STENT INTERVENTION (PCI-S);  Surgeon: Sinclair Grooms, MD;  Location: [facility] CATH LAB;  Service: Cardiovascular;  Laterality: N/A;  . TRIGGER FINGER RELEASE Left 12/21/2015   Procedure: LEFT LONG FINGER TRIGGER RELEASE ;  Surgeon: Milly Jakob, MD;  Location: [facility] [facility];  Service:  Orthopedics;  Laterality: Left;    Current Medications: Outpatient Medications Prior to Visit  Medication Sig Dispense Refill  . albuterol (PROVENTIL HFA) 108 (90 BASE) MCG/ACT inhaler Inhale 2 puffs into the lungs every 6 (six) hours as needed for wheezing or shortness of breath.     [name] albuterol (PROVENTIL) (2.5 MG/3ML) 0.083% nebulizer solution Take 2.5 mg by nebulization every 6 (six) hours as needed for wheezing or shortness of breath.     . allopurinol (ZYLOPRIM) 100 MG tablet Take 100 mg by mouth daily.    [name] amLODipine (NORVASC) 2.5 MG tablet Take 1 tablet (2.5 mg total) by mouth daily. 90 tablet 3  . aspirin EC 81 MG tablet Take 81 mg by mouth daily.    [name] b complex vitamins capsule Take 1 capsule by mouth every evening.     . Ferrous Sulfate (IRON) 325 (65 FE) MG TABS Take 1 tablet by mouth daily.    . finasteride (PROSCAR) 5 MG tablet Take 1 tablet (5 mg total) by mouth daily. 30 tablet 5  . gemfibrozil (LOPID) 600 MG tablet Take 600 mg by mouth at bedtime.     [name] glipiZIDE (GLUCOTROL) 5 MG tablet Take 1 tablet (5 mg total) by mouth daily before breakfast. 30 tablet 0  . guaifenesin (HUMIBID E) 400 MG TABS tablet Take 400 mg by mouth at bedtime.     . isosorbide mononitrate (IMDUR) 120 MG 24 hr tablet Take 1 tablet (120 mg total) by mouth daily. 90 tablet 3  . nitroGLYCERIN (NITROSTAT) 0.4 MG SL tablet Place 0.4 mg under the tongue every 5 (five) minutes as needed for chest pain.    . pravastatin (PRAVACHOL) 40 MG tablet Take 40 mg by mouth daily.    . primidone (MYSOLINE) 50 MG tablet Take 50 mg by mouth at bedtime.     . sucralfate (CARAFATE) 1 GM/10ML suspension Take 1 g by mouth 2 (two) times daily.    [name] tiotropium (SPIRIVA) 18 MCG inhalation capsule Place 18 mcg into inhaler and inhale every morning.     . vitamin B-12 (CYANOCOBALAMIN) 1000 MCG tablet Take 1,000 mcg by mouth daily.     No facility-administered medications prior to visit.      Allergies:   Zantac [ranitidine]     Social History   Social History  . Marital status: Married    Spouse name: N/A  . Number of children: N/A  . Years of education: N/A   Occupational History  . retired    Social History Main Topics  . Smoking status: Former Smoker    Packs/day: 1.00    Years: 64.00    Types: Cigarettes    Quit date: 05/29/2011  . Smokeless tobacco: Never Used  . Alcohol use Yes     Comment: rarely  . Drug use: No  . Sexual activity: Not Currently  Other Topics Concern  . Not on file   Social History Narrative   Denies Caffeine use      Family History:  The patient's family history includes Aneurysm in his mother; Aortic aneurysm in his father; COPD in his sister; Cancer in his brother; Other in his brother, sister, and sister.   ROS:   Please see the history of present illness.    Worried that he may be developing kidney failure. Otherwise no complaints. No claudication or edema.   other systems reviewed and are negative.   PHYSICAL EXAM:   VS:  There were no vitals taken for this visit.   GEN: Well nourished, well developed, in no acute distress  HEENT: normal  Neck: no JVD, carotid bruits, or masses Cardiac: RRR; no murmurs, rubs, or gallops,no edema  Respiratory:  clear to auscultation bilaterally, normal work of breathing GI: soft, nontender, nondistended, + BS MS: no deformity or atrophy  Skin: warm and dry, no rash Neuro:  Alert and Oriented x 3, Strength and sensation are intact Psych: euthymic mood, full affect  Wt Readings from Last 3 Encounters:  06/29/16 211 lb 10.3 oz (96 kg)  06/26/16 216 lb 9.6 oz (98.2 kg)  03/22/16 218 lb (98.9 kg)      Studies/Labs Reviewed:   EKG:  EKG  Not repeated Recent Labs: 06/27/2016: ALT 19; TSH 1.284 06/29/2016: Hemoglobin 12.0; Platelets 280 07/03/2016: BUN 32; Creatinine, Ser 2.03; Potassium 4.7; Sodium 139   Lipid Panel    Component Value Date/Time   CHOL 123 06/28/2016 0414   TRIG 112 06/28/2016 0414   HDL 30 (L)  06/28/2016 0414   CHOLHDL 4.1 06/28/2016 0414   VLDL 22 06/28/2016 0414   LDLCALC 71 06/28/2016 0414    Additional studies/ records that were reviewed today include:  Cardiac cath 06/2016: Diagnostic Diagram       Echocardiogram 07/13/2016: Study Conclusions  - Left ventricle: The cavity size was normal. There was moderate   concentric hypertrophy. Systolic function was normal. The   estimated ejection fraction was in the range of 55% to 60%. Wall   motion was normal; there were no regional wall motion   abnormalities. Doppler parameters are consistent with abnormal   left ventricular relaxation (grade 1 diastolic dysfunction). - Aortic valve: Noncoronary cusp mobility was severely restricted.   There was mild stenosis. - Left atrium: The atrium was moderately dilated. - Right atrium: The atrium was mildly dilated.   ASSESSMENT:    1. Coronary artery disease involving native coronary artery of native heart with angina pectoris ([place])   2. Essential hypertension   3. Simple chronic bronchitis ([place])   4. CKD (chronic kidney disease) stage 3, GFR 30-59 ml/min   5. Pure hypercholesterolemia   6. OSA (obstructive sleep apnea)      PLAN:  In order of problems listed above:  1. Chest complaints have resolved. Continue to monitor. Nitroglycerin if recurrent pain. He is unable to afford Ranexa, therefore the medication will be discontinued. Last dose was greater than one week ago and he has not had any change in complaint. 2. Well-controlled blood pressure. 2 g sodium diet..Target blood pressure is 16/80 year old last period  3.  not addressed 4. Creatinine is in the range of stage [name] CK. Recheck a basic metabolic panel in the next 4-6 weeks.If creatinine remains elevated above the baseline of 1.6-1.7, consider referral to nephrology. I basically told the patient I would defer this decision to his primary physician, Dr. a.  Ross 5. Not addressed 6. Not addressed   Clinical  follow-up in 6 months. Increase activity. 2 g sodium diet and 2 L fluid restriction per day.   Medication Adjustments/Labs and Tests Ordered: Current medicines are reviewed at length with the patient today.  Concerns regarding medicines are outlined above.  Medication changes, Labs and Tests ordered today are listed in the Patient Instructions below. There are no Patient Instructions on file for this visit.   Signed, Sinclair Grooms, MD  07/17/2016 5:40 PM    [facility] [address], [place], [place]  [address] Phone: [phone]; Fax: [phone]

## 2016-07-18 ENCOUNTER — Ambulatory Visit (INDEPENDENT_AMBULATORY_CARE_PROVIDER_SITE_OTHER): Payer: Medicare Other | Admitting: Interventional Cardiology

## 2016-07-18 ENCOUNTER — Telehealth: Payer: Self-pay | Admitting: Interventional Cardiology

## 2016-07-18 ENCOUNTER — Encounter: Payer: Self-pay | Admitting: Interventional Cardiology

## 2016-07-18 VITALS — BP 112/62 | HR 96 | Ht 68.0 in | Wt 208.4 lb

## 2016-07-18 DIAGNOSIS — J410 Simple chronic bronchitis: Secondary | ICD-10-CM

## 2016-07-18 DIAGNOSIS — E7800 Pure hypercholesterolemia, unspecified: Secondary | ICD-10-CM

## 2016-07-18 DIAGNOSIS — I209 Angina pectoris, unspecified: Secondary | ICD-10-CM | POA: Diagnosis not present

## 2016-07-18 DIAGNOSIS — N183 Chronic kidney disease, stage 3 unspecified: Secondary | ICD-10-CM

## 2016-07-18 DIAGNOSIS — I10 Essential (primary) hypertension: Secondary | ICD-10-CM | POA: Diagnosis not present

## 2016-07-18 DIAGNOSIS — I25119 Atherosclerotic heart disease of native coronary artery with unspecified angina pectoris: Secondary | ICD-10-CM

## 2016-07-18 DIAGNOSIS — G4733 Obstructive sleep apnea (adult) (pediatric): Secondary | ICD-10-CM

## 2016-07-18 NOTE — Telephone Encounter (Signed)
New message      Pt is returning Jennifers call from this morning

## 2016-07-18 NOTE — Telephone Encounter (Signed)
Left message to call back  

## 2016-07-18 NOTE — Patient Instructions (Addendum)
Medication Instructions:  1) DISCONTINUE [place]  Labwork: Your physician recommends that you return for lab work in: 6 weeks (BMET)   Testing/Procedures: None  Follow-Up: Your physician wants you to follow-up in: 6 months with Dr. Tamala Julian. You will receive a reminder letter in the mail two months in advance. If you don't receive a letter, please call our office to schedule the follow-up appointment.   Any Other Special Instructions Will Be Listed Below (If Applicable).  Restrict your fluid intake to 2 liters per day.    Low-Sodium Eating Plan Sodium, which is an element that makes up salt, helps you maintain a healthy balance of fluids in your body. Too much sodium can increase your blood pressure and cause fluid and waste to be held in your body. Your health care provider or dietitian may recommend following this plan if you have high blood pressure (hypertension), kidney disease, liver disease, or heart failure. Eating less sodium can help lower your blood pressure, reduce swelling, and protect your heart, liver, and kidneys. What are tips for following this plan? General guidelines   Most people on this plan should limit their sodium intake to 1,500-2,000 mg (milligrams) of sodium each day. Reading food labels   The Nutrition Facts label lists the amount of sodium in one serving of the food. If you eat more than one serving, you must multiply the listed amount of sodium by the number of servings.  Choose foods with less than 140 mg of sodium per serving.  Avoid foods with 300 mg of sodium or more per serving. Shopping   Look for lower-sodium products, often labeled as "low-sodium" or "no salt added."  Always check the sodium content even if foods are labeled as "unsalted" or "no salt added".  Buy fresh foods.  Avoid canned foods and premade or frozen meals.  Avoid canned, cured, or processed meats  Buy breads that have less than 80 mg of sodium per slice. Cooking    Eat more home-cooked food and less restaurant, buffet, and fast food.  Avoid adding salt when cooking. Use salt-free seasonings or herbs instead of table salt or sea salt. Check with your health care provider or pharmacist before using salt substitutes.  Cook with plant-based oils, such as canola, sunflower, or olive oil. Meal planning   When eating at a restaurant, ask that your food be prepared with less salt or no salt, if possible.  Avoid foods that contain MSG (monosodium glutamate). MSG is sometimes added to [place] food, bouillon, and some canned foods. What foods are recommended? The items listed may not be a complete list. Talk with your dietitian about what dietary choices are best for you. Grains  Low-sodium cereals, including oats, puffed wheat and rice, and shredded wheat. Low-sodium crackers. Unsalted rice. Unsalted pasta. Low-sodium bread. Whole-grain breads and whole-grain pasta. Vegetables  Fresh or frozen vegetables. "No salt added" canned vegetables. "No salt added" tomato sauce and paste. Low-sodium or reduced-sodium tomato and vegetable juice. Fruits  Fresh, frozen, or canned fruit. Fruit juice. Meats and other protein foods  Fresh or frozen (no salt added) meat, poultry, seafood, and fish. Low-sodium canned tuna and salmon. Unsalted nuts. Dried peas, beans, and lentils without added salt. Unsalted canned beans. Eggs. Unsalted nut butters. Dairy  Milk. Soy milk. Cheese that is naturally low in sodium, such as ricotta cheese, fresh mozzarella, or Swiss cheese Low-sodium or reduced-sodium cheese. Cream cheese. Yogurt. Fats and oils  Unsalted butter. Unsalted margarine with no trans fat. Vegetable  oils such as canola or olive oils. Seasonings and other foods  Fresh and dried herbs and spices. Salt-free seasonings. Low-sodium mustard and ketchup. Sodium-free salad dressing. Sodium-free light mayonnaise. Fresh or refrigerated horseradish. Lemon juice. Vinegar. Homemade,  reduced-sodium, or low-sodium soups. Unsalted popcorn and pretzels. Low-salt or salt-free chips. What foods are not recommended? The items listed may not be a complete list. Talk with your dietitian about what dietary choices are best for you. Grains  Instant hot cereals. Bread stuffing, pancake, and biscuit mixes. Croutons. Seasoned rice or pasta mixes. Noodle soup cups. Boxed or frozen macaroni and cheese. Regular salted crackers. Self-rising flour. Vegetables  Sauerkraut, pickled vegetables, and relishes. Olives. [place] fries. Onion rings. Regular canned vegetables (not low-sodium or reduced-sodium). Regular canned tomato sauce and paste (not low-sodium or reduced-sodium). Regular tomato and vegetable juice (not low-sodium or reduced-sodium). Frozen vegetables in sauces. Meats and other protein foods  Meat or fish that is salted, canned, smoked, spiced, or pickled. Bacon, ham, sausage, hotdogs, corned beef, chipped beef, packaged lunch meats, salt pork, jerky, pickled herring, anchovies, regular canned tuna, sardines, salted nuts. Dairy  Processed cheese and cheese spreads. Cheese curds. Blue cheese. Feta cheese. String cheese. Regular cottage cheese. Buttermilk. Canned milk. Fats and oils  Salted butter. Regular margarine. Ghee. Bacon fat. Seasonings and other foods  Onion salt, garlic salt, seasoned salt, table salt, and sea salt. Canned and packaged gravies. Worcestershire sauce. Tartar sauce. Barbecue sauce. Teriyaki sauce. Soy sauce, including reduced-sodium. Steak sauce. Fish sauce. Oyster sauce. Cocktail sauce. Horseradish that you find on the shelf. Regular ketchup and mustard. Meat flavorings and tenderizers. Bouillon cubes. Hot sauce and Tabasco sauce. Premade or packaged marinades. Premade or packaged taco seasonings. Relishes. Regular salad dressings. Salsa. Potato and tortilla chips. Corn chips and puffs. Salted popcorn and pretzels. Canned or dried soups. Pizza. Frozen entrees and pot  pies. Summary  Eating less sodium can help lower your blood pressure, reduce swelling, and protect your heart, liver, and kidneys.  Most people on this plan should limit their sodium intake to 1,500-2,000 mg (milligrams) of sodium each day.  Canned, boxed, and frozen foods are high in sodium. Restaurant foods, fast foods, and pizza are also very high in sodium. You also get sodium by adding salt to food.  Try to cook at home, eat more fresh fruits and vegetables, and eat less fast food, canned, processed, or prepared foods. This information is not intended to replace advice given to you by your health care provider. Make sure you discuss any questions you have with your health care provider. Document Released: 09/16/2001 Document Revised: 03/20/2016 Document Reviewed: 03/20/2016 Elsevier Interactive Patient Education  2017 [employer].    If you need a refill on your cardiac medications before your next appointment, please call your pharmacy.

## 2016-07-18 NOTE — Telephone Encounter (Signed)
Spoke with pt and made him aware that although he doesn't wear a CPAP or BIPAP he is still diagnosed with OSA and I can't remove it.  Pt verbalized understanding.

## 2016-07-18 NOTE — Telephone Encounter (Signed)
New message       Pt was seen this am.  On his printout it say he has OSA.  He states that he does not have a CPAP or BIPAP machine and he want that diagnosis taken off.  If any problems please call

## 2016-07-19 ENCOUNTER — Ambulatory Visit: Payer: Medicare Other | Admitting: Neurology

## 2016-07-25 ENCOUNTER — Telehealth: Payer: Self-pay | Admitting: Interventional Cardiology

## 2016-07-25 NOTE — Telephone Encounter (Signed)
New message    Pt is calling to ask if he can be worked in this week. He is asking for RN to call him.

## 2016-07-25 NOTE — Telephone Encounter (Signed)
Spoke with pt and he is going to [facility] due to a family member being ill.  Pt has appt on 4/25 with Dr. Tamala Julian and wanted to see about moving in.  Advised pt he was seen on 4/10 and was told to f/u in 6 months.  Advised I will cancel 4/25 appt.  Pt verbalized understanding and was appreciative for call.

## 2016-07-25 NOTE — Telephone Encounter (Signed)
Left message to call back  

## 2016-08-02 ENCOUNTER — Ambulatory Visit: Payer: 59 | Admitting: Interventional Cardiology

## 2016-08-22 ENCOUNTER — Other Ambulatory Visit: Payer: Medicare Other

## 2016-08-23 ENCOUNTER — Other Ambulatory Visit: Payer: Medicare Other | Admitting: *Deleted

## 2016-08-23 DIAGNOSIS — I10 Essential (primary) hypertension: Secondary | ICD-10-CM

## 2016-08-23 LAB — BASIC METABOLIC PANEL
BUN / CREAT RATIO: 22 (ref 10–24)
BUN: 43 mg/dL — AB (ref 8–27)
CO2: 18 mmol/L (ref 18–29)
Calcium: 9.4 mg/dL (ref 8.6–10.2)
Chloride: 102 mmol/L (ref 96–106)
Creatinine, Ser: 1.95 mg/dL — ABNORMAL HIGH (ref 0.76–1.27)
GFR calc non Af Amer: 32 mL/min/{1.73_m2} — ABNORMAL LOW (ref >59)
GFR, EST AFRICAN AMERICAN: 37 mL/min/{1.73_m2} — AB (ref >59)
Glucose: 157 mg/dL — ABNORMAL HIGH (ref 65–99)
Potassium: 4.8 mmol/L (ref 3.5–5.2)
Sodium: 140 mmol/L (ref 134–144)

## 2016-08-25 DIAGNOSIS — Z7984 Long term (current) use of oral hypoglycemic drugs: Secondary | ICD-10-CM | POA: Diagnosis not present

## 2016-08-25 DIAGNOSIS — N183 Chronic kidney disease, stage 3 (moderate): Secondary | ICD-10-CM | POA: Diagnosis not present

## 2016-08-25 DIAGNOSIS — E1140 Type 2 diabetes mellitus with diabetic neuropathy, unspecified: Secondary | ICD-10-CM | POA: Diagnosis not present

## 2016-08-25 DIAGNOSIS — Z0000 Encounter for general adult medical examination without abnormal findings: Secondary | ICD-10-CM | POA: Diagnosis not present

## 2016-09-08 DIAGNOSIS — L820 Inflamed seborrheic keratosis: Secondary | ICD-10-CM | POA: Diagnosis not present

## 2016-09-22 DIAGNOSIS — E669 Obesity, unspecified: Secondary | ICD-10-CM | POA: Diagnosis not present

## 2016-09-22 DIAGNOSIS — E119 Type 2 diabetes mellitus without complications: Secondary | ICD-10-CM | POA: Diagnosis not present

## 2016-09-22 DIAGNOSIS — N189 Chronic kidney disease, unspecified: Secondary | ICD-10-CM | POA: Diagnosis not present

## 2016-09-22 DIAGNOSIS — I10 Essential (primary) hypertension: Secondary | ICD-10-CM | POA: Diagnosis not present

## 2016-09-22 DIAGNOSIS — N183 Chronic kidney disease, stage 3 (moderate): Secondary | ICD-10-CM | POA: Diagnosis not present

## 2016-10-13 DIAGNOSIS — N183 Chronic kidney disease, stage 3 (moderate): Secondary | ICD-10-CM | POA: Diagnosis not present

## 2016-10-30 DIAGNOSIS — K5792 Diverticulitis of intestine, part unspecified, without perforation or abscess without bleeding: Secondary | ICD-10-CM | POA: Diagnosis not present

## 2016-11-01 DIAGNOSIS — N183 Chronic kidney disease, stage 3 (moderate): Secondary | ICD-10-CM | POA: Diagnosis not present

## 2016-11-01 DIAGNOSIS — E669 Obesity, unspecified: Secondary | ICD-10-CM | POA: Diagnosis not present

## 2016-11-01 DIAGNOSIS — I10 Essential (primary) hypertension: Secondary | ICD-10-CM | POA: Diagnosis not present

## 2016-11-01 DIAGNOSIS — E119 Type 2 diabetes mellitus without complications: Secondary | ICD-10-CM | POA: Diagnosis not present

## 2016-11-13 DIAGNOSIS — N183 Chronic kidney disease, stage 3 (moderate): Secondary | ICD-10-CM | POA: Diagnosis not present

## 2016-11-17 DIAGNOSIS — N184 Chronic kidney disease, stage 4 (severe): Secondary | ICD-10-CM | POA: Diagnosis not present

## 2016-12-01 DIAGNOSIS — E1140 Type 2 diabetes mellitus with diabetic neuropathy, unspecified: Secondary | ICD-10-CM | POA: Diagnosis not present

## 2016-12-01 DIAGNOSIS — Z23 Encounter for immunization: Secondary | ICD-10-CM | POA: Diagnosis not present

## 2016-12-01 DIAGNOSIS — I10 Essential (primary) hypertension: Secondary | ICD-10-CM | POA: Diagnosis not present

## 2016-12-01 DIAGNOSIS — Z7984 Long term (current) use of oral hypoglycemic drugs: Secondary | ICD-10-CM | POA: Diagnosis not present

## 2016-12-13 DIAGNOSIS — N184 Chronic kidney disease, stage 4 (severe): Secondary | ICD-10-CM | POA: Diagnosis not present

## 2017-01-05 DIAGNOSIS — E669 Obesity, unspecified: Secondary | ICD-10-CM | POA: Diagnosis not present

## 2017-01-05 DIAGNOSIS — N183 Chronic kidney disease, stage 3 (moderate): Secondary | ICD-10-CM | POA: Diagnosis not present

## 2017-01-05 DIAGNOSIS — I10 Essential (primary) hypertension: Secondary | ICD-10-CM | POA: Diagnosis not present

## 2017-01-05 DIAGNOSIS — E119 Type 2 diabetes mellitus without complications: Secondary | ICD-10-CM | POA: Diagnosis not present

## 2017-01-09 NOTE — Progress Notes (Signed)
Cardiology Office Note    Date:  01/10/2017   ID:  KIRTAN SADA, DOB 1934/12/27, MRN [id number]  PCP:  Lawerance Cruel, MD  Cardiologist: Sinclair Grooms, MD   Chief Complaint  Patient presents with  . Coronary Artery Disease  . Shortness of Breath    History of Present Illness:  Marco Acevedo is a 82 y.o. male with CAD and prior circumflex stents, Chronic DHF, hyperlipidemia, hypertension, obesity and OSA.  Having significant angina that occurs in peaks and valleys. He can go several weeks with minimal chest discomfort and then have a week or 2 when chest discomfort occurs relatively frequently. This seems to be an interplay with eating/diet and chest discomfort. He also feels a Carafate has a significant benefit in relieving symptoms. Nitroglycerin at times seems to work better. Discomfort is not predictably exertion related. The discomfort is described as pressure/tightness. Therefore coronary angiography was performed in March 2018 and moderate circumflex disease was noted as outlined below. After the angiogram was performed he had couple months where he used no nitroglycerin and had minimal if any episodes of chest pain. He was also on daily Carafate during this time.   Past Medical History:  Diagnosis Date  . Anemia    low iron  . BPH (benign prostatic hyperplasia)   . Bruises easily   . CAD (coronary artery disease)    a. per Dr. Thompson Caul note, prior stenting of Cx in 2007 and 2012. b. DES to Cx in 09/2012.  [name] Chronic diastolic CHF (congestive heart failure) ([place])   . CKD (chronic kidney disease), stage [name] ([place])   . Claustrophobia   . Complication of anesthesia    has to have head elevated to keep from strangling on post nasal drip  . COPD (chronic obstructive pulmonary disease) ([place])   . Diabetes mellitus type II    type 2  . Diarrhea 2015   had for a year and a half  . GERD (gastroesophageal reflux disease)   . Gout   . Granuloma annulare   . History of  hiatal hernia   . History of kidney stones   . Hyperlipidemia   . Hypertension   . Neuropathy   . Obesity   . OSA (obstructive sleep apnea)   . Wears dentures    top  . Wears glasses     Past Surgical History:  Procedure Laterality Date  . BLEPHAROPLASTY    . CARDIAC CATHETERIZATION  [phone]   X 2 stents  . CHOLECYSTECTOMY  09/23/2014  . CHOLECYSTECTOMY N/A 09/23/2014   Procedure: LAPAROSCOPIC CHOLECYSTECTOMY WITH INTRAOPERATIVE CHOLANGIOGRAM;  Surgeon: Autumn Messing [name], MD;  Location: [place];  Service: General;  Laterality: N/A;  . COLONOSCOPY    . COLONOSCOPY WITH PROPOFOL N/A 03/22/2016   Procedure: COLONOSCOPY WITH PROPOFOL;  Surgeon: Wilford Corner, MD;  Location: [facility] ENDOSCOPY;  Service: Endoscopy;  Laterality: N/A;  . ESOPHAGOGASTRODUODENOSCOPY (EGD) WITH PROPOFOL N/A 03/22/2016   Procedure: ESOPHAGOGASTRODUODENOSCOPY (EGD) WITH PROPOFOL;  Surgeon: Wilford Corner, MD;  Location: [facility] ENDOSCOPY;  Service: Endoscopy;  Laterality: N/A;  . EYE SURGERY     both cataracts  . KNEE ARTHROSCOPY WITH MEDIAL MENISECTOMY Right 08/19/2013   Procedure: RIGHT KNEE ARTHROSCOPY WITH PARTIAL MEDIAL MENISECTOMY AND CHONDROPLASTY;  Surgeon: Hessie Dibble, MD;  Location: [place];  Service: Orthopedics;  Laterality: Right;  . LEFT HEART CATH AND CORONARY ANGIOGRAPHY N/A 06/28/2016   Procedure: Left Heart Cath and Coronary Angiography;  Surgeon: Mallie Mussel  Nicholes Stairs, MD;  Location: [place] CV LAB;  Service: Cardiovascular;  Laterality: N/A;  . LEFT HEART CATHETERIZATION WITH CORONARY ANGIOGRAM N/A 10/03/2011   Procedure: LEFT HEART CATHETERIZATION WITH CORONARY ANGIOGRAM;  Surgeon: Sinclair Grooms, MD;  Location: [facility] CATH LAB;  Service: Cardiovascular;  Laterality: N/A;  . LEFT HEART CATHETERIZATION WITH CORONARY ANGIOGRAM N/A 09/12/2012   Procedure: LEFT HEART CATHETERIZATION WITH CORONARY ANGIOGRAM;  Surgeon: Sinclair Grooms, MD;  Location: [facility] CATH LAB;  Service: Cardiovascular;   Laterality: N/A;  . LIPOMA EXCISION     Biospy only; left parotid gland  . none    . PERCUTANEOUS CORONARY STENT INTERVENTION (PCI-S) N/A 09/17/2012   Procedure: PERCUTANEOUS CORONARY STENT INTERVENTION (PCI-S);  Surgeon: Sinclair Grooms, MD;  Location: [facility] CATH LAB;  Service: Cardiovascular;  Laterality: N/A;  . TRIGGER FINGER RELEASE Left 12/21/2015   Procedure: LEFT LONG FINGER TRIGGER RELEASE ;  Surgeon: Milly Jakob, MD;  Location: [place];  Service: Orthopedics;  Laterality: Left;    Current Medications: Outpatient Medications Prior to Visit  Medication Sig Dispense Refill  . albuterol (PROVENTIL HFA) 108 (90 BASE) MCG/ACT inhaler Inhale 2 puffs into the lungs every 6 (six) hours as needed for wheezing or shortness of breath.     [name] albuterol (PROVENTIL) (2.5 MG/3ML) 0.083% nebulizer solution Take 2.5 mg by nebulization every 6 (six) hours as needed for wheezing or shortness of breath.     . allopurinol (ZYLOPRIM) 100 MG tablet Take 100 mg by mouth daily.    [name] aspirin EC 81 MG tablet Take 81 mg by mouth daily.    [name] b complex vitamins capsule Take 1 capsule by mouth every evening.     . Ferrous Sulfate (IRON) 325 (65 FE) MG TABS Take 1 tablet by mouth daily.    . finasteride (PROSCAR) 5 MG tablet Take 1 tablet (5 mg total) by mouth daily. 30 tablet 5  . gemfibrozil (LOPID) 600 MG tablet Take 600 mg by mouth at bedtime.     [name] glipiZIDE (GLUCOTROL) 5 MG tablet Take 1 tablet (5 mg total) by mouth daily before breakfast. 30 tablet 0  . guaifenesin (HUMIBID E) 400 MG TABS tablet Take 400 mg by mouth at bedtime.     . isosorbide mononitrate (IMDUR) 120 MG 24 hr tablet Take 1 tablet (120 mg total) by mouth daily. 90 tablet 3  . nitroGLYCERIN (NITROSTAT) 0.4 MG SL tablet Place 0.4 mg under the tongue every 5 (five) minutes as needed for chest pain.    . pravastatin (PRAVACHOL) 40 MG tablet Take 40 mg by mouth daily.    . primidone (MYSOLINE) 50 MG tablet Take 50 mg by mouth  at bedtime.     . sucralfate (CARAFATE) 1 GM/10ML suspension Take 1 g by mouth 2 (two) times daily.    . vitamin B-12 (CYANOCOBALAMIN) 1000 MCG tablet Take 1,000 mcg by mouth daily.    [name] amLODipine (NORVASC) 2.5 MG tablet Take 1 tablet (2.5 mg total) by mouth daily. (Patient not taking: Reported on 01/10/2017) 90 tablet 3  . tiotropium (SPIRIVA) 18 MCG inhalation capsule Place 18 mcg into inhaler and inhale every morning.      No facility-administered medications prior to visit.      Allergies:   Zantac [ranitidine]   Social History   Social History  . Marital status: Married    Spouse name: N/A  . Number of children: N/A  . Years of education: N/A  Occupational History  . retired    Social History Main Topics  . Smoking status: Former Smoker    Packs/day: 1.00    Years: 64.00    Types: Cigarettes    Quit date: 05/29/2011  . Smokeless tobacco: Never Used  . Alcohol use Yes     Comment: rarely  . Drug use: No  . Sexual activity: Not Currently   Other Topics Concern  . None   Social History Narrative   Denies Caffeine use      Family History:  The patient's family history includes Aneurysm in his mother; Aortic aneurysm in his father; COPD in his sister; Cancer in his brother; Other in his brother, sister, and sister.   ROS:   Please see the history of present illness.    Easy bruising, recurring chest pain, or frequent after eating occurring at both rest and with exertion.  All other systems reviewed and are negative.   PHYSICAL EXAM:   VS:  BP 116/68 (BP Location: Left Arm)   Pulse 63   Ht 5\' 8"  (1.727 m)   Wt 205 lb (93 kg)   BMI 31.17 kg/m    GEN: Well nourished, well developed, in no acute distress  HEENT: normal  Neck: no JVD, carotid bruits, or masses Cardiac: RRR; no murmurs, rubs, or gallops,no edema  Respiratory:  clear to auscultation bilaterally, normal work of breathing GI: soft, nontender, nondistended, + BS MS: no deformity or atrophy  Skin:  warm and dry, no rash Neuro:  Alert and Oriented x 3, Strength and sensation are intact Psych: euthymic mood, full affect  Wt Readings from Last 3 Encounters:  01/10/17 205 lb (93 kg)  07/18/16 208 lb 6.4 oz (94.5 kg)  06/29/16 211 lb 10.3 oz (96 kg)      Studies/Labs Reviewed:   EKG:  EKG  Not repeated  Recent Labs: 06/27/2016: ALT 19; TSH 1.284 06/29/2016: Hemoglobin 12.0; Platelets 280 08/23/2016: BUN 43; Creatinine, Ser 1.95; Potassium 4.8; Sodium 140   Lipid Panel    Component Value Date/Time   CHOL 123 06/28/2016 0414   TRIG 112 06/28/2016 0414   HDL 30 (L) 06/28/2016 0414   CHOLHDL 4.1 06/28/2016 0414   VLDL 22 06/28/2016 0414   LDLCALC 71 06/28/2016 0414    Additional studies/ records that were reviewed today include:  Cardiac catheterization March 2018: Diagnostic Diagram         ASSESSMENT:    1. Coronary artery disease involving native coronary artery of native heart with angina pectoris ([place])   2. Essential hypertension   3. Simple chronic bronchitis ([place])   4. CKD (chronic kidney disease) stage 3, GFR 30-59 ml/min ([facility])   5. Pure hypercholesterolemia      PLAN:  In order of problems listed above:  1. With angina, variable threshold, raising the possibility that coronary vasoconstriction may have a role. Moderate obstructive disease in the circumflex at the time of most recent angiogram in March. Continue current course. Notify us if increasing angina. Remain active. Able to use nitroglycerin as needed. Call if more than 3-5 tablets a use within a 24 hour time frame. 2. Controlled on the current medical regimen. I reviewed and there are rare if any blood pressures greater than 294 systolic and no diastolic pressures greater than 80 mmHg. Continue current management. Cautioned about low salt intake. 3. Not addressed 4. Not addressed. Sees Dr. Fleet Contras but will switch to a new provider after his retirement earlier this year. 5.  LDL target less  than 70.  Aerobic activity. Call of progressive chest discomfort. Reported prolonged discomfort not relieved by sublingual nitroglycerin. 6-9 month follow-up.    Medication Adjustments/Labs and Tests Ordered: Current medicines are reviewed at length with the patient today.  Concerns regarding medicines are outlined above.  Medication changes, Labs and Tests ordered today are listed in the Patient Instructions below. Patient Instructions  Medication Instructions:  Your physician recommends that you continue on your current medications as directed. Please refer to the Current Medication list given to you today.   Labwork: None ordered  Testing/Procedures: None ordered  Follow-Up: Your physician wants you to follow-up in: 9 months with Dr. Tamala Julian. You will receive a reminder letter in the mail two months in advance. If you don't receive a letter, please call our office to schedule the follow-up appointment.   Any Other Special Instructions Will Be Listed Below (If Applicable).     If you need a refill on your cardiac medications before your next appointment, please call your pharmacy.      Signed, Sinclair Grooms, MD  01/10/2017 4:44 PM    [facility] [address], [place], [place]  [address] Phone: [phone]; Fax: [phone]

## 2017-01-10 ENCOUNTER — Encounter: Payer: Self-pay | Admitting: Interventional Cardiology

## 2017-01-10 ENCOUNTER — Ambulatory Visit (INDEPENDENT_AMBULATORY_CARE_PROVIDER_SITE_OTHER): Payer: Medicare Other | Admitting: Interventional Cardiology

## 2017-01-10 VITALS — BP 116/68 | HR 63 | Ht 68.0 in | Wt 205.0 lb

## 2017-01-10 DIAGNOSIS — J410 Simple chronic bronchitis: Secondary | ICD-10-CM | POA: Diagnosis not present

## 2017-01-10 DIAGNOSIS — I25119 Atherosclerotic heart disease of native coronary artery with unspecified angina pectoris: Secondary | ICD-10-CM | POA: Diagnosis not present

## 2017-01-10 DIAGNOSIS — I10 Essential (primary) hypertension: Secondary | ICD-10-CM | POA: Diagnosis not present

## 2017-01-10 DIAGNOSIS — N183 Chronic kidney disease, stage 3 unspecified: Secondary | ICD-10-CM

## 2017-01-10 DIAGNOSIS — E7800 Pure hypercholesterolemia, unspecified: Secondary | ICD-10-CM

## 2017-01-10 DIAGNOSIS — I209 Angina pectoris, unspecified: Secondary | ICD-10-CM

## 2017-01-10 NOTE — Patient Instructions (Addendum)
Medication Instructions:  Your physician recommends that you continue on your current medications as directed. Please refer to the Current Medication list given to you today.   Labwork: None ordered  Testing/Procedures: None ordered  Follow-Up: Your physician wants you to follow-up in: 9 months with Dr. Tamala Julian. You will receive a reminder letter in the mail two months in advance. If you don't receive a letter, please call our office to schedule the follow-up appointment.   Any Other Special Instructions Will Be Listed Below (If Applicable).  You may take Nitroglycerin as needed for chest discomfort.    If you need a refill on your cardiac medications before your next appointment, please call your pharmacy.

## 2017-03-28 DIAGNOSIS — M79606 Pain in leg, unspecified: Secondary | ICD-10-CM | POA: Diagnosis not present

## 2017-03-28 DIAGNOSIS — Z7984 Long term (current) use of oral hypoglycemic drugs: Secondary | ICD-10-CM | POA: Diagnosis not present

## 2017-03-28 DIAGNOSIS — K219 Gastro-esophageal reflux disease without esophagitis: Secondary | ICD-10-CM | POA: Diagnosis not present

## 2017-03-28 DIAGNOSIS — G629 Polyneuropathy, unspecified: Secondary | ICD-10-CM | POA: Diagnosis not present

## 2017-03-28 DIAGNOSIS — E1140 Type 2 diabetes mellitus with diabetic neuropathy, unspecified: Secondary | ICD-10-CM | POA: Diagnosis not present

## 2017-03-28 DIAGNOSIS — I10 Essential (primary) hypertension: Secondary | ICD-10-CM | POA: Diagnosis not present

## 2017-05-02 DIAGNOSIS — E669 Obesity, unspecified: Secondary | ICD-10-CM | POA: Diagnosis not present

## 2017-05-02 DIAGNOSIS — E1122 Type 2 diabetes mellitus with diabetic chronic kidney disease: Secondary | ICD-10-CM | POA: Diagnosis not present

## 2017-05-02 DIAGNOSIS — I129 Hypertensive chronic kidney disease with stage 1 through stage 4 chronic kidney disease, or unspecified chronic kidney disease: Secondary | ICD-10-CM | POA: Diagnosis not present

## 2017-05-02 DIAGNOSIS — N183 Chronic kidney disease, stage 3 (moderate): Secondary | ICD-10-CM | POA: Diagnosis not present

## 2017-05-02 DIAGNOSIS — G629 Polyneuropathy, unspecified: Secondary | ICD-10-CM | POA: Diagnosis not present

## 2017-05-06 DIAGNOSIS — E119 Type 2 diabetes mellitus without complications: Secondary | ICD-10-CM | POA: Diagnosis not present

## 2017-05-06 DIAGNOSIS — S161XXA Strain of muscle, fascia and tendon at neck level, initial encounter: Secondary | ICD-10-CM | POA: Diagnosis not present

## 2017-05-06 DIAGNOSIS — M542 Cervicalgia: Secondary | ICD-10-CM | POA: Diagnosis not present

## 2017-05-08 DIAGNOSIS — M25511 Pain in right shoulder: Secondary | ICD-10-CM | POA: Diagnosis not present

## 2017-05-08 DIAGNOSIS — M542 Cervicalgia: Secondary | ICD-10-CM | POA: Diagnosis not present

## 2017-05-22 DIAGNOSIS — M25511 Pain in right shoulder: Secondary | ICD-10-CM | POA: Diagnosis not present

## 2017-06-18 DIAGNOSIS — M5021 Other cervical disc displacement,  high cervical region: Secondary | ICD-10-CM | POA: Diagnosis not present

## 2017-06-19 DIAGNOSIS — M5030 Other cervical disc degeneration, unspecified cervical region: Secondary | ICD-10-CM | POA: Diagnosis not present

## 2017-06-19 DIAGNOSIS — M25511 Pain in right shoulder: Secondary | ICD-10-CM | POA: Diagnosis not present

## 2017-06-19 DIAGNOSIS — M24811 Other specific joint derangements of right shoulder, not elsewhere classified: Secondary | ICD-10-CM | POA: Diagnosis not present

## 2017-06-26 DIAGNOSIS — R109 Unspecified abdominal pain: Secondary | ICD-10-CM | POA: Diagnosis not present

## 2017-06-26 DIAGNOSIS — M5412 Radiculopathy, cervical region: Secondary | ICD-10-CM | POA: Diagnosis not present

## 2017-07-18 DIAGNOSIS — M5412 Radiculopathy, cervical region: Secondary | ICD-10-CM | POA: Diagnosis not present

## 2017-08-15 DIAGNOSIS — M5412 Radiculopathy, cervical region: Secondary | ICD-10-CM | POA: Diagnosis not present

## 2017-10-04 DIAGNOSIS — R1013 Epigastric pain: Secondary | ICD-10-CM | POA: Diagnosis not present

## 2017-10-04 DIAGNOSIS — K219 Gastro-esophageal reflux disease without esophagitis: Secondary | ICD-10-CM | POA: Diagnosis not present

## 2017-10-08 DIAGNOSIS — I639 Cerebral infarction, unspecified: Secondary | ICD-10-CM

## 2017-10-08 HISTORY — DX: Cerebral infarction, unspecified: I63.9

## 2017-10-13 ENCOUNTER — Observation Stay (HOSPITAL_COMMUNITY): Payer: Medicare Other

## 2017-10-13 ENCOUNTER — Other Ambulatory Visit: Payer: Self-pay

## 2017-10-13 ENCOUNTER — Encounter (HOSPITAL_COMMUNITY): Payer: Self-pay

## 2017-10-13 ENCOUNTER — Inpatient Hospital Stay (HOSPITAL_COMMUNITY)
Admission: EM | Admit: 2017-10-13 | Discharge: 2017-10-15 | DRG: 065 | Disposition: A | Discharge status: Home / Self Care | Payer: Medicare Other | Attending: Internal Medicine | Admitting: Internal Medicine

## 2017-10-13 DIAGNOSIS — I25119 Atherosclerotic heart disease of native coronary artery with unspecified angina pectoris: Secondary | ICD-10-CM | POA: Diagnosis present

## 2017-10-13 DIAGNOSIS — R202 Paresthesia of skin: Secondary | ICD-10-CM | POA: Diagnosis not present

## 2017-10-13 DIAGNOSIS — E1122 Type 2 diabetes mellitus with diabetic chronic kidney disease: Secondary | ICD-10-CM | POA: Diagnosis present

## 2017-10-13 DIAGNOSIS — Z6832 Body mass index (BMI) 32.0-32.9, adult: Secondary | ICD-10-CM

## 2017-10-13 DIAGNOSIS — M79602 Pain in left arm: Secondary | ICD-10-CM

## 2017-10-13 DIAGNOSIS — I63512 Cerebral infarction due to unspecified occlusion or stenosis of left middle cerebral artery: Principal | ICD-10-CM | POA: Diagnosis present

## 2017-10-13 DIAGNOSIS — N184 Chronic kidney disease, stage 4 (severe): Secondary | ICD-10-CM | POA: Diagnosis present

## 2017-10-13 DIAGNOSIS — I5032 Chronic diastolic (congestive) heart failure: Secondary | ICD-10-CM | POA: Diagnosis not present

## 2017-10-13 DIAGNOSIS — G4733 Obstructive sleep apnea (adult) (pediatric): Secondary | ICD-10-CM | POA: Diagnosis present

## 2017-10-13 DIAGNOSIS — Z87891 Personal history of nicotine dependence: Secondary | ICD-10-CM

## 2017-10-13 DIAGNOSIS — I10 Essential (primary) hypertension: Secondary | ICD-10-CM | POA: Diagnosis present

## 2017-10-13 DIAGNOSIS — N185 Chronic kidney disease, stage 5: Secondary | ICD-10-CM | POA: Diagnosis not present

## 2017-10-13 DIAGNOSIS — N179 Acute kidney failure, unspecified: Secondary | ICD-10-CM | POA: Diagnosis not present

## 2017-10-13 DIAGNOSIS — E1142 Type 2 diabetes mellitus with diabetic polyneuropathy: Secondary | ICD-10-CM | POA: Diagnosis present

## 2017-10-13 DIAGNOSIS — R531 Weakness: Secondary | ICD-10-CM

## 2017-10-13 DIAGNOSIS — N183 Chronic kidney disease, stage 3 (moderate): Secondary | ICD-10-CM | POA: Diagnosis not present

## 2017-10-13 DIAGNOSIS — R2689 Other abnormalities of gait and mobility: Secondary | ICD-10-CM | POA: Diagnosis not present

## 2017-10-13 DIAGNOSIS — J410 Simple chronic bronchitis: Secondary | ICD-10-CM | POA: Diagnosis not present

## 2017-10-13 DIAGNOSIS — J449 Chronic obstructive pulmonary disease, unspecified: Secondary | ICD-10-CM | POA: Diagnosis present

## 2017-10-13 DIAGNOSIS — I132 Hypertensive heart and chronic kidney disease with heart failure and with stage 5 chronic kidney disease, or end stage renal disease: Secondary | ICD-10-CM | POA: Diagnosis not present

## 2017-10-13 DIAGNOSIS — K219 Gastro-esophageal reflux disease without esophagitis: Secondary | ICD-10-CM | POA: Diagnosis not present

## 2017-10-13 DIAGNOSIS — G8191 Hemiplegia, unspecified affecting right dominant side: Secondary | ICD-10-CM | POA: Diagnosis not present

## 2017-10-13 DIAGNOSIS — E669 Obesity, unspecified: Secondary | ICD-10-CM | POA: Diagnosis present

## 2017-10-13 DIAGNOSIS — I639 Cerebral infarction, unspecified: Secondary | ICD-10-CM | POA: Diagnosis not present

## 2017-10-13 DIAGNOSIS — Z79899 Other long term (current) drug therapy: Secondary | ICD-10-CM

## 2017-10-13 DIAGNOSIS — R29703 NIHSS score 3: Secondary | ICD-10-CM | POA: Diagnosis present

## 2017-10-13 DIAGNOSIS — N400 Enlarged prostate without lower urinary tract symptoms: Secondary | ICD-10-CM | POA: Diagnosis present

## 2017-10-13 DIAGNOSIS — Z7982 Long term (current) use of aspirin: Secondary | ICD-10-CM

## 2017-10-13 DIAGNOSIS — I2510 Atherosclerotic heart disease of native coronary artery without angina pectoris: Secondary | ICD-10-CM | POA: Diagnosis present

## 2017-10-13 DIAGNOSIS — Z7984 Long term (current) use of oral hypoglycemic drugs: Secondary | ICD-10-CM

## 2017-10-13 DIAGNOSIS — E785 Hyperlipidemia, unspecified: Secondary | ICD-10-CM | POA: Diagnosis present

## 2017-10-13 DIAGNOSIS — Z955 Presence of coronary angioplasty implant and graft: Secondary | ICD-10-CM

## 2017-10-13 DIAGNOSIS — Z5329 Procedure and treatment not carried out because of patient's decision for other reasons: Secondary | ICD-10-CM | POA: Diagnosis not present

## 2017-10-13 DIAGNOSIS — F40240 Claustrophobia: Secondary | ICD-10-CM | POA: Diagnosis present

## 2017-10-13 HISTORY — DX: Weakness: R53.1

## 2017-10-13 LAB — I-STAT CHEM 8, ED
BUN: 52 mg/dL — ABNORMAL HIGH (ref 8–23)
CREATININE: 3.3 mg/dL — AB (ref 0.61–1.24)
Calcium, Ion: 1.08 mmol/L — ABNORMAL LOW (ref 1.15–1.40)
Chloride: 102 mmol/L (ref 98–111)
Glucose, Bld: 160 mg/dL — ABNORMAL HIGH (ref 70–99)
HEMATOCRIT: 36 % — AB (ref 39.0–52.0)
HEMOGLOBIN: 12.2 g/dL — AB (ref 13.0–17.0)
POTASSIUM: 4.9 mmol/L (ref 3.5–5.1)
SODIUM: 134 mmol/L — AB (ref 135–145)
TCO2: 22 mmol/L (ref 22–32)

## 2017-10-13 LAB — PROTIME-INR
INR: 1
PROTHROMBIN TIME: 13.1 s (ref 11.4–15.2)

## 2017-10-13 LAB — CBC WITH DIFFERENTIAL/PLATELET
Abs Immature Granulocytes: 0 10*3/uL (ref 0.0–0.1)
Basophils Absolute: 0.1 10*3/uL (ref 0.0–0.1)
Basophils Relative: 1 %
EOS ABS: 0.1 10*3/uL (ref 0.0–0.7)
EOS PCT: 1 %
HEMATOCRIT: 38.8 % — AB (ref 39.0–52.0)
HEMOGLOBIN: 12.7 g/dL — AB (ref 13.0–17.0)
Immature Granulocytes: 0 %
LYMPHS PCT: 19 %
Lymphs Abs: 1.7 10*3/uL (ref 0.7–4.0)
MCH: 29.3 pg (ref 26.0–34.0)
MCHC: 32.7 g/dL (ref 30.0–36.0)
MCV: 89.6 fL (ref 78.0–100.0)
MONO ABS: 0.8 10*3/uL (ref 0.1–1.0)
Monocytes Relative: 9 %
Neutro Abs: 6.5 10*3/uL (ref 1.7–7.7)
Neutrophils Relative %: 70 %
Platelets: 256 10*3/uL (ref 150–400)
RBC: 4.33 MIL/uL (ref 4.22–5.81)
RDW: 14.1 % (ref 11.5–15.5)
WBC: 9.3 10*3/uL (ref 4.0–10.5)

## 2017-10-13 LAB — URINALYSIS, ROUTINE W REFLEX MICROSCOPIC
Bilirubin Urine: NEGATIVE
GLUCOSE, UA: NEGATIVE mg/dL
HGB URINE DIPSTICK: NEGATIVE
Ketones, ur: NEGATIVE mg/dL
LEUKOCYTES UA: NEGATIVE
NITRITE: NEGATIVE
PROTEIN: 30 mg/dL — AB
SPECIFIC GRAVITY, URINE: 1.009 (ref 1.005–1.030)
pH: 5 (ref 5.0–8.0)

## 2017-10-13 LAB — I-STAT TROPONIN, ED: Troponin i, poc: 0 ng/mL (ref 0.00–0.08)

## 2017-10-13 LAB — ETHANOL

## 2017-10-13 LAB — BASIC METABOLIC PANEL
Anion gap: 7 (ref 5–15)
BUN: 46 mg/dL — AB (ref 8–23)
CHLORIDE: 103 mmol/L (ref 98–111)
CO2: 24 mmol/L (ref 22–32)
CREATININE: 3.08 mg/dL — AB (ref 0.61–1.24)
Calcium: 8.7 mg/dL — ABNORMAL LOW (ref 8.9–10.3)
GFR calc Af Amer: 20 mL/min — ABNORMAL LOW (ref >60)
GFR calc non Af Amer: 18 mL/min — ABNORMAL LOW (ref >60)
GLUCOSE: 162 mg/dL — AB (ref 70–99)
Potassium: 4.9 mmol/L (ref 3.5–5.1)
Sodium: 134 mmol/L — ABNORMAL LOW (ref 135–145)

## 2017-10-13 LAB — GLUCOSE, CAPILLARY: GLUCOSE-CAPILLARY: 205 mg/dL — AB (ref 70–99)

## 2017-10-13 LAB — RAPID URINE DRUG SCREEN, HOSP PERFORMED
AMPHETAMINES: NOT DETECTED
Benzodiazepines: NOT DETECTED
Cocaine: NOT DETECTED
Opiates: NOT DETECTED
TETRAHYDROCANNABINOL: NOT DETECTED

## 2017-10-13 LAB — APTT: APTT: 30 s (ref 24–36)

## 2017-10-13 MED ORDER — ACETAMINOPHEN 650 MG RE SUPP: 650.0000 mg | RECTAL | Status: DC | PRN

## 2017-10-13 MED ORDER — FERROUS SULFATE 325 (65 FE) MG PO TABS
325.0000 mg | ORAL_TABLET | Freq: Every day | ORAL | Status: DC
Start: 2017-10-14 — End: 2017-10-15
  Administered 2017-10-14 – 2017-10-15 (×2): 325 mg via ORAL
  Filled 2017-10-13 (×2): qty 1

## 2017-10-13 MED ORDER — LORAZEPAM 2 MG/ML IJ SOLN
1.0000 mg | Freq: Once | INTRAMUSCULAR | Status: AC
  Administered 2017-10-13: 1 mg via INTRAVENOUS
  Filled 2017-10-13: qty 1

## 2017-10-13 MED ORDER — PRAVASTATIN SODIUM 40 MG PO TABS
40.0000 mg | ORAL_TABLET | Freq: Every day | ORAL | Status: DC
  Administered 2017-10-14: 40 mg via ORAL
  Filled 2017-10-13 (×2): qty 1

## 2017-10-13 MED ORDER — TRAMADOL HCL 50 MG PO TABS
50.0000 mg | ORAL_TABLET | Freq: Every evening | ORAL | Status: DC | PRN
  Administered 2017-10-14: 50 mg via ORAL
  Filled 2017-10-13: qty 1

## 2017-10-13 MED ORDER — DOCUSATE SODIUM 100 MG PO CAPS
100.0000 mg | ORAL_CAPSULE | Freq: Every day | ORAL | Status: DC
  Administered 2017-10-13 – 2017-10-15 (×2): 100 mg via ORAL
  Filled 2017-10-13 (×3): qty 1

## 2017-10-13 MED ORDER — ISOSORBIDE MONONITRATE ER 60 MG PO TB24
120.0000 mg | ORAL_TABLET | Freq: Every day | ORAL | Status: DC
Start: 2017-10-14 — End: 2017-10-15
  Administered 2017-10-14 – 2017-10-15 (×2): 120 mg via ORAL
  Filled 2017-10-13 (×3): qty 2

## 2017-10-13 MED ORDER — SENNOSIDES-DOCUSATE SODIUM 8.6-50 MG PO TABS: 1.0000 | ORAL_TABLET | Freq: Every evening | ORAL | Status: DC | PRN

## 2017-10-13 MED ORDER — PRIMIDONE 50 MG PO TABS
50.0000 mg | ORAL_TABLET | Freq: Every day | ORAL | Status: DC
  Administered 2017-10-13 – 2017-10-14 (×2): 50 mg via ORAL
  Filled 2017-10-13 (×2): qty 1

## 2017-10-13 MED ORDER — ALPRAZOLAM 0.5 MG PO TABS
0.5000 mg | ORAL_TABLET | ORAL | Status: DC | PRN
  Administered 2017-10-13: 0.5 mg via ORAL
  Filled 2017-10-13: qty 1

## 2017-10-13 MED ORDER — ALBUTEROL SULFATE (2.5 MG/3ML) 0.083% IN NEBU: 3.0000 mL | INHALATION_SOLUTION | Freq: Four times a day (QID) | RESPIRATORY_TRACT | Status: DC | PRN

## 2017-10-13 MED ORDER — ASPIRIN EC 81 MG PO TBEC
81.0000 mg | DELAYED_RELEASE_TABLET | Freq: Every day | ORAL | Status: DC
  Administered 2017-10-14 – 2017-10-15 (×2): 81 mg via ORAL
  Filled 2017-10-13 (×3): qty 1

## 2017-10-13 MED ORDER — VITAMIN B-12 1000 MCG PO TABS
1000.0000 ug | ORAL_TABLET | Freq: Every day | ORAL | Status: DC
  Administered 2017-10-14 – 2017-10-15 (×2): 1000 ug via ORAL
  Filled 2017-10-13 (×3): qty 1

## 2017-10-13 MED ORDER — ENOXAPARIN SODIUM 40 MG/0.4ML ~~LOC~~ SOLN
40.0000 mg | SUBCUTANEOUS | Status: DC
  Administered 2017-10-13: 40 mg via SUBCUTANEOUS
  Filled 2017-10-13: qty 0.4

## 2017-10-13 MED ORDER — STROKE: EARLY STAGES OF RECOVERY BOOK
Freq: Once | Status: AC
  Administered 2017-10-13: 18:00:00
  Filled 2017-10-13: qty 1

## 2017-10-13 MED ORDER — SODIUM CHLORIDE 0.9 % IV SOLN: INTRAVENOUS | Status: DC

## 2017-10-13 MED ORDER — FINASTERIDE 5 MG PO TABS
5.0000 mg | ORAL_TABLET | Freq: Every day | ORAL | Status: DC
  Administered 2017-10-14 – 2017-10-15 (×2): 5 mg via ORAL
  Filled 2017-10-13 (×2): qty 1

## 2017-10-13 MED ORDER — GUAIFENESIN 200 MG PO TABS
400.0000 mg | ORAL_TABLET | Freq: Every day | ORAL | Status: DC
  Administered 2017-10-13 – 2017-10-14 (×2): 400 mg via ORAL
  Filled 2017-10-13 (×2): qty 2

## 2017-10-13 MED ORDER — INSULIN ASPART 100 UNIT/ML ~~LOC~~ SOLN
0.0000 [IU] | Freq: Three times a day (TID) | SUBCUTANEOUS | Status: DC
  Administered 2017-10-13: 2 [IU] via SUBCUTANEOUS

## 2017-10-13 MED ORDER — ACETAMINOPHEN 160 MG/5ML PO SOLN: 650.0000 mg | ORAL | Status: DC | PRN

## 2017-10-13 MED ORDER — ENOXAPARIN SODIUM 30 MG/0.3ML ~~LOC~~ SOLN
30.0000 mg | SUBCUTANEOUS | Status: DC
  Administered 2017-10-14: 30 mg via SUBCUTANEOUS
  Filled 2017-10-13: qty 0.3

## 2017-10-13 MED ORDER — ALLOPURINOL 100 MG PO TABS
100.0000 mg | ORAL_TABLET | Freq: Every day | ORAL | Status: DC
  Administered 2017-10-14 – 2017-10-15 (×2): 100 mg via ORAL
  Filled 2017-10-13 (×3): qty 1

## 2017-10-13 MED ORDER — ALBUTEROL SULFATE (2.5 MG/3ML) 0.083% IN NEBU: 2.5000 mg | INHALATION_SOLUTION | Freq: Four times a day (QID) | RESPIRATORY_TRACT | Status: DC | PRN

## 2017-10-13 MED ORDER — INSULIN ASPART 100 UNIT/ML ~~LOC~~ SOLN: 0.0000 [IU] | Freq: Every day | SUBCUTANEOUS | Status: DC

## 2017-10-13 MED ORDER — ACETAMINOPHEN 325 MG PO TABS: 650.0000 mg | ORAL_TABLET | ORAL | Status: DC | PRN

## 2017-10-13 NOTE — ED Notes (Signed)
Patient asked for urine sample, unable to give sample at this time. 

## 2017-10-13 NOTE — ED Triage Notes (Signed)
Pt presents with R arm and leg weakness that he first noted at 0815 this morning.  Pt was seen at [facility] as a code stroke, pt signed out AMA to come here.  Pt has regained some function to arm.

## 2017-10-13 NOTE — Progress Notes (Signed)
Sent transport for patient in the ED, however they had admitted him to the hospital and sent him up stairs.  Will continue with schedule and work our way back to him.

## 2017-10-13 NOTE — Consult Note (Signed)
NEURO HOSPITALIST      Requesting Physician: Dr. Roel Cluck    Chief Complaint:  Right side weakness  History obtained from:  Patient  / Son  HPI:                                                                                                                                         Marco Acevedo is an 83 y.o. male  With PMH significant for HTN, HLD, DM, CAD post 2 stents 2012 who presented to [facility] ED with right sided weakness.   Patient woke up this morning in his usual state of health about 0815. When he went to comb his hair about 15 minutes later he noticed that he didn't have control of his right arm. He described his arm as being floppy, and he was unable to lift the right arm above the level of his shoulder. Patient had tingling in the back of his neck that was present at the time of onset. This numbness is not new. Patient states he has numbness in his neck due to his disk degeneration. denies any numbness, tingling in face or arms. Denies any leg weakness,dizziness, room spinning, HA, visual problems, slurred speech, CP, SOB( outside of his usual shortness of breath), problems walking. Son states that he did notice that his father appeared to be dragging his right leg when he was walking. Patient denies any problems walking. Patient was on vacation and opted to go to [facility] center. CT of head at [facility] was negative for any acute findings. ( Son has papers from [place] [facility] with Head CT). They chose to leave AMA and come to [place] which is home for further evaluation. He did receive a baby aspirin at [employer].  He does take a baby aspirin daily.Since leaving [place] patient's son states that his arm strength has gotten better, but it is still not back to normal.   ED course:  CT at [facility] showed no acute abnormality.  MRI Brain: pending, MRA Head and Neck: pending BP on arrival was 104/61, BG: 160, BUN: 52, creatinine: 3.30, NA:  134  No prior history of stroke. Patient has seen a neurologist in 2017 for tremor.   Date last known well: Date: 10/13/2017 Time last known well: Time: 08:15 tPA Given: No: contraindicated, outside of window Modified Rankin: Rankin Score=1  NIHSS: 3  1a Level of Conscious:0 1b LOC Questions: 0 1c LOC Commands: 0 2 Best Gaze: 0 3 Visual: 0 4 Facial Palsy: 1 5a Motor Arm - left:  5b Motor Arm - Right: 1 6a Motor Leg - Left: 0 6b Motor Leg -  Right: 0 7 Limb Ataxia: 1 8 Sensory: 0 9 Best Language: 0 10 Dysarthria:0 11 Extinct. and Inattention:0 TOTAL: 3   Past Medical History:  Diagnosis Date  . Anemia    low iron  . BPH (benign prostatic hyperplasia)   . Bruises easily   . CAD (coronary artery disease)    a. per Dr. Thompson Caul note, prior stenting of Cx in 2007 and 2012. b. DES to Cx in 09/2012.  [name] Chronic diastolic CHF (congestive heart failure) ([place])   . CKD (chronic kidney disease), stage [name] ([place])   . Claustrophobia   . Complication of anesthesia    has to have head elevated to keep from strangling on post nasal drip  . COPD (chronic obstructive pulmonary disease) ([place])   . Diabetes mellitus type II    type 2  . Diarrhea 2015   had for a year and a half  . GERD (gastroesophageal reflux disease)   . Gout   . Granuloma annulare   . History of hiatal hernia   . History of kidney stones   . Hyperlipidemia   . Hypertension   . Neuropathy   . Obesity   . OSA (obstructive sleep apnea)   . Right sided weakness   . Wears dentures    top  . Wears glasses     Past Surgical History:  Procedure Laterality Date  . BLEPHAROPLASTY    . CARDIAC CATHETERIZATION  [phone]   X 2 stents  . CHOLECYSTECTOMY  09/23/2014  . CHOLECYSTECTOMY N/A 09/23/2014   Procedure: LAPAROSCOPIC CHOLECYSTECTOMY WITH INTRAOPERATIVE CHOLANGIOGRAM;  Surgeon: Autumn Messing [name], MD;  Location: [place];  Service: General;  Laterality: N/A;  . COLONOSCOPY    . COLONOSCOPY WITH PROPOFOL N/A  03/22/2016   Procedure: COLONOSCOPY WITH PROPOFOL;  Surgeon: Wilford Corner, MD;  Location: [facility] ENDOSCOPY;  Service: Endoscopy;  Laterality: N/A;  . ESOPHAGOGASTRODUODENOSCOPY (EGD) WITH PROPOFOL N/A 03/22/2016   Procedure: ESOPHAGOGASTRODUODENOSCOPY (EGD) WITH PROPOFOL;  Surgeon: Wilford Corner, MD;  Location: [facility] ENDOSCOPY;  Service: Endoscopy;  Laterality: N/A;  . EYE SURGERY     both cataracts  . KNEE ARTHROSCOPY WITH MEDIAL MENISECTOMY Right 08/19/2013   Procedure: RIGHT KNEE ARTHROSCOPY WITH PARTIAL MEDIAL MENISECTOMY AND CHONDROPLASTY;  Surgeon: Hessie Dibble, MD;  Location: [place];  Service: Orthopedics;  Laterality: Right;  . LEFT HEART CATH AND CORONARY ANGIOGRAPHY N/A 06/28/2016   Procedure: Left Heart Cath and Coronary Angiography;  Surgeon: Belva Crome, MD;  Location: [place] CV LAB;  Service: Cardiovascular;  Laterality: N/A;  . LEFT HEART CATHETERIZATION WITH CORONARY ANGIOGRAM N/A 10/03/2011   Procedure: LEFT HEART CATHETERIZATION WITH CORONARY ANGIOGRAM;  Surgeon: Sinclair Grooms, MD;  Location: [facility] CATH LAB;  Service: Cardiovascular;  Laterality: N/A;  . LEFT HEART CATHETERIZATION WITH CORONARY ANGIOGRAM N/A 09/12/2012   Procedure: LEFT HEART CATHETERIZATION WITH CORONARY ANGIOGRAM;  Surgeon: Sinclair Grooms, MD;  Location: [facility] CATH LAB;  Service: Cardiovascular;  Laterality: N/A;  . LIPOMA EXCISION     Biospy only; left parotid gland  . none    . PERCUTANEOUS CORONARY STENT INTERVENTION (PCI-S) N/A 09/17/2012   Procedure: PERCUTANEOUS CORONARY STENT INTERVENTION (PCI-S);  Surgeon: Sinclair Grooms, MD;  Location: [facility] CATH LAB;  Service: Cardiovascular;  Laterality: N/A;  . TRIGGER FINGER RELEASE Left 12/21/2015   Procedure: LEFT LONG FINGER TRIGGER RELEASE ;  Surgeon: Milly Jakob, MD;  Location: [place];  Service: Orthopedics;  Laterality: Left;  Family History  Problem Relation Age of Onset  . Aortic aneurysm Father   .  Aneurysm Mother        brain  . Cancer Brother   . COPD Sister   . Other Sister        health hx unknown  . Other Sister        health hx unknown  . Other Brother        health hx unknown        Social History:  reports that he quit smoking about 6 years ago. His smoking use included cigarettes. He has a 64.00 pack-year smoking history. He has never used smokeless tobacco. He reports that he drinks alcohol. He reports that he does not use drugs.  Allergies:  Allergies  Allergen Reactions  . Cimetidine Other (See Comments)    Anxiety Attacks    Medications:                                                                                                                           Scheduled: . [START ON 10/14/2017] allopurinol  100 mg Oral Daily  . [START ON 10/14/2017] aspirin EC  81 mg Oral Daily  . docusate sodium  100 mg Oral Daily  . enoxaparin (LOVENOX) injection  40 mg Subcutaneous Q24H  . [START ON 10/14/2017] ferrous sulfate  325 mg Oral Daily  . [START ON 10/14/2017] finasteride  5 mg Oral Daily  . guaiFENesin  400 mg Oral QHS  . insulin aspart  0-5 Units Subcutaneous QHS  . insulin aspart  0-9 Units Subcutaneous TID WC  . [START ON 10/14/2017] isosorbide mononitrate  120 mg Oral Daily  . [START ON 10/14/2017] pravastatin  40 mg Oral Daily  . primidone  50 mg Oral QHS  . [START ON 10/14/2017] vitamin B-12  1,000 mcg Oral Daily   Continuous: . sodium chloride Stopped (10/13/17 1815)   UKG:[id number] **OR** acetaminophen (TYLENOL) oral liquid 160 mg/5 mL **OR** acetaminophen, albuterol, ALPRAZolam, senna-docusate, traMADol   ROS:                                                                                                                                       History obtained from the patient  General ROS: negative for - chills, fatigue, fever, night sweats, weight gain or weight loss  Ophthalmic ROS: negative for - blurry  vision, double vision, eye pain or loss of  vision Respiratory ROS: negative for - cough,   New shortness of breath or wheezing Cardiovascular ROS: negative for - chest pain, dyspnea on exertion,  Musculoskeletal ROS: positive for right arm weakness, chronic neck pain Neurological ROS: as noted in HPI   General Examination:                                                                                                      Blood pressure 127/83, pulse 70, temperature (!) 97.4 F (36.3 C), temperature source Oral, resp. rate 16, SpO2 95 %.  HEENT-  Normocephalic, no lesions, without obvious abnormality.  Normal external eye and conjunctiva.  Cardiovascular- S1-S2 audible, pulses palpable throughout  Lungs-no rhonchi or wheezing noted, no excessive working breathing.  Saturations within normal limits on RA Extremities- Warm, dry and intact Musculoskeletal- neck pain, no joint tenderness, deformity or swelling Skin-warm and dry, intact  Neurological Examination Mental Status: Alert, oriented, to person/place/time/event.  Speech fluent without evidence of aphasia.  Able to follow  commands without difficulty. Cranial Nerves: II:  Visual fields grossly normal,  [name],IV, VI: ptosis not present, extra-ocular motions intact bilaterally, pupils equal, round, reactive to light and accommodation V,VII: smile asymmetric mild right facial droop noted, facial light touch sensation normal bilaterally VIII: hearing normal bilaterally IX,X: uvula rises symmetrically XI: bilateral shoulder shrug XII: midline tongue extension Motor: Right : Upper extremity   4-/5 except 2-3/45 deltoid Left:     Upper extremity   5/5  Lower extremity   5/5     Lower extremity   5/5 Sensory:  light touch intact throughout, bilaterally Deep Tendon Reflexes: 2+ biceps, patellae Plantars: Right: downgoing   Left: downgoing Cerebellar: normal finger-to-nose on left side, ataxia noted in right arm, normal rapid alternating movements and normal heel-to-shin  test Gait: deferred   Lab Results: Basic Metabolic Panel: Recent Labs  Lab 10/13/17 1528 10/13/17 1539  NA 134* 134*  K 4.9 4.9  CL 103 102  CO2 24  --   GLUCOSE 162* 160*  BUN 46* 52*  CREATININE 3.08* 3.30*  CALCIUM 8.7*  --     CBC: Recent Labs  Lab 10/13/17 1528 10/13/17 1539  WBC 9.3  --   NEUTROABS 6.5  --   HGB 12.7* 12.2*  HCT 38.8* 36.0*  MCV 89.6  --   PLT 256  --     Lipid Panel: No results for input(s): CHOL, TRIG, HDL, CHOLHDL, VLDL, LDLCALC in the last 168 hours.  CBG: No results for input(s): GLUCAP in the last 168 hours.  Imaging: Dg Chest 2 View  Result Date: 10/13/2017 CLINICAL DATA:  Right arm and  leg weakness. EXAM: CHEST - 2 VIEW COMPARISON:  06/27/2016 FINDINGS: AP and lateral views were obtained. The lungs are clear without focal pneumonia, edema, pneumothorax or pleural effusion. Cardiopericardial silhouette is at upper limits of normal for size. The visualized bony structures of the thorax are intact. Telemetry leads overlie the chest. IMPRESSION: No active cardiopulmonary disease. Electronically Signed   By: Randall Hiss  Tery Sanfilippo M.D.   On: 10/13/2017 17:13     History and examination documented by Laurey Morale, MSN, NP-C, [facility] [phone] 10/13/2017, 5:29 PM    Assessment: an 83 y.o. male  With PMH significant for HTN, HLD, DM, CAD post 2 stents 2012 who presented to [facility] ED with right sided weakness. Ct from [facility] showed no acute abnormality. MRI and MRA pending. Further stroke work up needed.  Stroke Risk Factors - diabetes mellitus, hyperlipidemia and hypertension, age.   Recommend --BP goal : Permissive HTN with SBP < 220. If MRI brain negative for stroke, obtain MRI cervical spine and discontinue permissive HTN protocol.   --MRI Brain  --MRA of the head without contrast (has low GFR) --Carotid ultrasound --Echocardiogram --ASA --continue Pravastatin as prescribed --HgbA1c, fasting lipid panel --PT consult, OT  consult, Speech consult --Telemetry monitoring --Frequent neuro checks --Stroke swallow screen  --Please page stroke NP  Or  PA  Or MD from 8am -4 pm  as this patient from this time will be  followed by the stroke.   You can look them up on [email or web address]  Password [email or web address]  I have seen and examined the patient. I have amended the assessment and plan above.  Electronically signed: Dr. Kerney Elbe

## 2017-10-13 NOTE — ED Provider Notes (Signed)
[facility] EMERGENCY DEPARTMENT Provider Note   CSN: [id number] Arrival date & time: 10/13/17  1359     History   Chief Complaint No chief complaint on file.   HPI Marco Acevedo is a 83 y.o. male with a past medical history of CAD, CHF, CKD, diabetes, hypertension, hyperlipidemia, who presents to ED for evaluation of onset of right arm weakness that began at 8:15 AM this morning.  It is now 3:30 PM.  His son noted that he had weakness in his arm when he was trying to brush his hair.  He was unable to do so.  Patient [place] near [facility].  They took him to the ED for concern for stroke.  CT of the head reviewed from earlier this morning showed no acute findings.  BMP shows elevation in BUN and creatinine.  Patient was told that he should be admitted to the hospital for further stroke work-up including an MRI.  However, family and patient decided to leave AMA and instead come to [place] where they reside.  During the time since then, son states that he noticed the patient's "right leg is dragging when he walks."  States that he has regained some of the strength to his right upper extremity.  When symptoms began he did not notice a facial droop or slurring of his speech.  He was given an aspirin at the [facility] ED.  Patient also states that he played golf outside in the heat yesterday and then had a muscle cramp to his right inner thigh.  He also complains of ongoing neck pain and tingling of his right arm.  He is being followed by an orthopedist for the symptoms and was told that he had a pinched nerve in his neck.  He did have the symptoms this morning when he had onset of the weakness.  He denies any prior history of CVA, injuries or falls, numbness in arms or legs, chest pain, shortness of breath.  HPI  Past Medical History:  Diagnosis Date  . Anemia    low iron  . BPH (benign prostatic hyperplasia)   . Bruises easily   . CAD  (coronary artery disease)    a. per Dr. Thompson Caul note, prior stenting of Cx in 2007 and 2012. b. DES to Cx in 09/2012.  [name] Chronic diastolic CHF (congestive heart failure) ([place])   . CKD (chronic kidney disease), stage [name] ([place])   . Claustrophobia   . Complication of anesthesia    has to have head elevated to keep from strangling on post nasal drip  . COPD (chronic obstructive pulmonary disease) ([place])   . Diabetes mellitus type II    type 2  . Diarrhea 2015   had for a year and a half  . GERD (gastroesophageal reflux disease)   . Gout   . Granuloma annulare   . History of hiatal hernia   . History of kidney stones   . Hyperlipidemia   . Hypertension   . Neuropathy   . Obesity   . OSA (obstructive sleep apnea)   . Right sided weakness   . Wears dentures    top  . Wears glasses     Patient Active Problem List   Diagnosis Date Noted  . Right sided weakness   . Epigastric abdominal tenderness 03/22/2016  . Hx of colonic polyps 03/22/2016  . Status post surgery 09/23/2014  . Gallstones 09/23/2014  . Parotid mass   .  Essential hypertension 05/29/2013  . CKD (chronic kidney disease) stage 3, GFR 30-59 ml/min (HCC) 09/13/2012    Class: Chronic  . Coronary artery disease involving native coronary artery of native heart with angina pectoris ([place]) 09/12/2012    Class: Acute  . Pure hypercholesterolemia 09/01/2012  . URI (upper respiratory infection) 03/05/2012  . COPD (chronic obstructive pulmonary disease) ([place]) 05/11/2011  . OSA (obstructive sleep apnea) 05/11/2011    Past Surgical History:  Procedure Laterality Date  . BLEPHAROPLASTY    . CARDIAC CATHETERIZATION  [phone]   X 2 stents  . CHOLECYSTECTOMY  09/23/2014  . CHOLECYSTECTOMY N/A 09/23/2014   Procedure: LAPAROSCOPIC CHOLECYSTECTOMY WITH INTRAOPERATIVE CHOLANGIOGRAM;  Surgeon: Autumn Messing [name], MD;  Location: [facility];  Service: General;  Laterality: N/A;  . COLONOSCOPY    . COLONOSCOPY WITH PROPOFOL N/A 03/22/2016    Procedure: COLONOSCOPY WITH PROPOFOL;  Surgeon: Wilford Corner, MD;  Location: [facility] ENDOSCOPY;  Service: Endoscopy;  Laterality: N/A;  . ESOPHAGOGASTRODUODENOSCOPY (EGD) WITH PROPOFOL N/A 03/22/2016   Procedure: ESOPHAGOGASTRODUODENOSCOPY (EGD) WITH PROPOFOL;  Surgeon: Wilford Corner, MD;  Location: [facility] ENDOSCOPY;  Service: Endoscopy;  Laterality: N/A;  . EYE SURGERY     both cataracts  . KNEE ARTHROSCOPY WITH MEDIAL MENISECTOMY Right 08/19/2013   Procedure: RIGHT KNEE ARTHROSCOPY WITH PARTIAL MEDIAL MENISECTOMY AND CHONDROPLASTY;  Surgeon: Hessie Dibble, MD;  Location: [facility];  Service: Orthopedics;  Laterality: Right;  . LEFT HEART CATH AND CORONARY ANGIOGRAPHY N/A 06/28/2016   Procedure: Left Heart Cath and Coronary Angiography;  Surgeon: Belva Crome, MD;  Location: [facility] CV LAB;  Service: Cardiovascular;  Laterality: N/A;  . LEFT HEART CATHETERIZATION WITH CORONARY ANGIOGRAM N/A 10/03/2011   Procedure: LEFT HEART CATHETERIZATION WITH CORONARY ANGIOGRAM;  Surgeon: Sinclair Grooms, MD;  Location: [facility] CATH LAB;  Service: Cardiovascular;  Laterality: N/A;  . LEFT HEART CATHETERIZATION WITH CORONARY ANGIOGRAM N/A 09/12/2012   Procedure: LEFT HEART CATHETERIZATION WITH CORONARY ANGIOGRAM;  Surgeon: Sinclair Grooms, MD;  Location: [facility] CATH LAB;  Service: Cardiovascular;  Laterality: N/A;  . LIPOMA EXCISION     Biospy only; left parotid gland  . none    . PERCUTANEOUS CORONARY STENT INTERVENTION (PCI-S) N/A 09/17/2012   Procedure: PERCUTANEOUS CORONARY STENT INTERVENTION (PCI-S);  Surgeon: Sinclair Grooms, MD;  Location: [facility] CATH LAB;  Service: Cardiovascular;  Laterality: N/A;  . TRIGGER FINGER RELEASE Left 12/21/2015   Procedure: LEFT LONG FINGER TRIGGER RELEASE ;  Surgeon: Milly Jakob, MD;  Location: [place];  Service: Orthopedics;  Laterality: Left;        Home Medications    Prior to Admission medications   Medication Sig Start Date End  Date Taking? Authorizing Provider  albuterol (PROVENTIL HFA) 108 (90 BASE) MCG/ACT inhaler Inhale 2 puffs into the lungs every 6 (six) hours as needed for wheezing or shortness of breath.    Yes [provider]  albuterol (PROVENTIL) (2.5 MG/3ML) 0.083% nebulizer solution Take 2.5 mg by nebulization every 6 (six) hours as needed for wheezing or shortness of breath.    Yes [provider]  allopurinol (ZYLOPRIM) 100 MG tablet Take 100 mg by mouth daily.   Yes [provider]  aspirin EC 81 MG tablet Take 81 mg by mouth daily.   Yes [provider]  docusate sodium (STOOL SOFTENER) 100 MG capsule Take 100 mg by mouth daily.   Yes [provider]  Ferrous Sulfate (IRON) 325 (65 FE) MG TABS  Take 1 tablet by mouth daily.   Yes [provider]  finasteride (PROSCAR) 5 MG tablet Take 1 tablet (5 mg total) by mouth daily. 11/29/11  Yes Rigoberto Noel, MD  glipiZIDE (GLUCOTROL) 5 MG tablet Take 1 tablet (5 mg total) by mouth daily before breakfast. 06/30/16  Yes Barrett, Evelene Croon, PA-C  guaifenesin (HUMIBID E) 400 MG TABS tablet Take 400 mg by mouth at bedtime.    Yes [provider]  isosorbide mononitrate (IMDUR) 120 MG 24 hr tablet Take 1 tablet (120 mg total) by mouth daily. 11/20/14  Yes Belva Crome, MD  losartan (COZAAR) 50 MG tablet Take 50 mg by mouth daily.    Yes [provider]  nitroGLYCERIN (NITROSTAT) 0.4 MG SL tablet Place 0.4 mg under the tongue every 5 (five) minutes as needed for chest pain.   Yes [provider]  pravastatin (PRAVACHOL) 40 MG tablet Take 40 mg by mouth daily.   Yes [provider]  primidone (MYSOLINE) 50 MG tablet Take 50 mg by mouth at bedtime.    Yes [provider]  ranitidine (ZANTAC) 150 MG tablet Take 150 mg by mouth 2 (two) times daily.   Yes [provider]  traMADol (ULTRAM) 50 MG tablet Take 50 mg by mouth at bedtime as needed for moderate pain.   Yes  [provider]  vitamin B-12 (CYANOCOBALAMIN) 1000 MCG tablet Take 1,000 mcg by mouth daily.   Yes [provider]    Family History Family History  Problem Relation Age of Onset  . Aortic aneurysm Father   . Aneurysm Mother        brain  . Cancer Brother   . COPD Sister   . Other Sister        health hx unknown  . Other Sister        health hx unknown  . Other Brother        health hx unknown    Social History Social History   Tobacco Use  . Smoking status: Former Smoker    Packs/day: 1.00    Years: 64.00    Pack years: 64.00    Types: Cigarettes    Last attempt to quit: 05/29/2011    Years since quitting: 6.3  . Smokeless tobacco: Never Used  Substance Use Topics  . Alcohol use: Yes    Comment: rarely  . Drug use: No     Allergies   Cimetidine   Review of Systems Review of Systems  Constitutional: Negative for appetite change, chills and fever.  HENT: Negative for ear pain, rhinorrhea, sneezing and sore throat.   Eyes: Negative for photophobia and visual disturbance.  Respiratory: Negative for cough, chest tightness, shortness of breath and wheezing.   Cardiovascular: Negative for chest pain and palpitations.  Gastrointestinal: Negative for abdominal pain, blood in stool, constipation, diarrhea, nausea and vomiting.  Genitourinary: Negative for dysuria, hematuria and urgency.  Musculoskeletal: Negative for myalgias.  Skin: Negative for rash.  Neurological: Positive for weakness. Negative for dizziness and light-headedness.     Physical Exam Updated Vital Signs BP 127/83   Pulse 70   Temp (!) 97.4 F (36.3 C) (Oral)   Resp 16   SpO2 95%   Physical Exam  Constitutional: He is oriented to person, place, and time. He appears well-developed and well-nourished. No distress.  HENT:  Head: Normocephalic and atraumatic.  Nose: Nose normal.  Eyes: Pupils are equal, round, and reactive to light. Conjunctivae and  EOM are normal. Right  eye exhibits no discharge. Left eye exhibits no discharge. No scleral icterus.  Neck: Normal range of motion. Neck supple.  Cardiovascular: Normal rate, regular rhythm, normal heart sounds and intact distal pulses. Exam reveals no gallop and no friction rub.  No murmur heard. Pulmonary/Chest: Effort normal and breath sounds normal. No respiratory distress.  Abdominal: Soft. Bowel sounds are normal. He exhibits no distension. There is no tenderness. There is no guarding.  Musculoskeletal: Normal range of motion. He exhibits no edema.  Neurological: He is alert and oriented to person, place, and time. No cranial nerve deficit or sensory deficit. He exhibits normal muscle tone. Coordination normal.  Minimal decrease in grip strength on RUE. Strength 5/5 in BLE. Normal sensation to light touch in bilateral upper and lower extremities. No facial symmetry noted. Tongue protrusion midline. Unable to fully abduct R arm overhead.   Skin: Skin is warm and dry. No rash noted.  Psychiatric: He has a normal mood and affect.  Nursing note and vitals reviewed.    ED Treatments / Results  Labs (all labs ordered are listed, but only abnormal results are displayed) Labs Reviewed  CBC WITH DIFFERENTIAL/PLATELET - Abnormal; Notable for the following components:      Result Value   Hemoglobin 12.7 (*)    HCT 38.8 (*)    All other components within normal limits  BASIC METABOLIC PANEL - Abnormal; Notable for the following components:   Sodium 134 (*)    Glucose, Bld 162 (*)    BUN 46 (*)    Creatinine, Ser 3.08 (*)    Calcium 8.7 (*)    GFR calc non Af Amer 18 (*)    GFR calc Af Amer 20 (*)    All other components within normal limits  I-STAT CHEM 8, ED - Abnormal; Notable for the following components:   Sodium 134 (*)    BUN 52 (*)    Creatinine, Ser 3.30 (*)    Glucose, Bld 160 (*)    Calcium, Ion 1.08 (*)    Hemoglobin 12.2 (*)    HCT 36.0 (*)    All other components within normal limits    ETHANOL  PROTIME-INR  APTT  RAPID URINE DRUG SCREEN, HOSP PERFORMED  URINALYSIS, ROUTINE W REFLEX MICROSCOPIC  HEMOGLOBIN A1C  LIPID PANEL  I-STAT TROPONIN, ED    EKG EKG Interpretation  Date/Time:  Saturday October 13 2017 14:06:41 EDT Ventricular Rate:  77 PR Interval:  168 QRS Duration: 78 QT Interval:  362 QTC Calculation: 409 R Axis:   33 Text Interpretation:  Normal sinus rhythm Normal ECG Confirmed by Pattricia Boss [phone]) on 10/13/2017 3:34:30 PM Also confirmed by Pattricia Boss [phone]), editor Philomena Doheny [phone])  on 10/13/2017 4:23:56 PM   Radiology Dg Chest 2 View  Result Date: 10/13/2017 CLINICAL DATA:  Right arm and  leg weakness. EXAM: CHEST - 2 VIEW COMPARISON:  06/27/2016 FINDINGS: AP and lateral views were obtained. The lungs are clear without focal pneumonia, edema, pneumothorax or pleural effusion. Cardiopericardial silhouette is at upper limits of normal for size. The visualized bony structures of the thorax are intact. Telemetry leads overlie the chest. IMPRESSION: No active cardiopulmonary disease. Electronically Signed   By: Misty Stanley M.D.   On: 10/13/2017 17:13    Procedures Procedures (including critical care time)  Medications Ordered in ED Medications  albuterol (PROVENTIL) (2.5 MG/3ML) 0.083% nebulizer solution 3 mL (has no administration in time range)  allopurinol (ZYLOPRIM) tablet 100  mg (has no administration in time range)  aspirin EC tablet 81 mg (has no administration in time range)  docusate sodium (COLACE) capsule 100 mg (has no administration in time range)  Iron TABS 325 mg (has no administration in time range)  finasteride (PROSCAR) tablet 5 mg (has no administration in time range)  guaifenesin (HUMIBID E) tablet 400 mg (has no administration in time range)  isosorbide mononitrate (IMDUR) 24 hr tablet 120 mg (has no administration in time range)  pravastatin (PRAVACHOL) tablet 40 mg (has no administration in time range)  primidone  (MYSOLINE) tablet 50 mg (has no administration in time range)  traMADol (ULTRAM) tablet 50 mg (has no administration in time range)  vitamin B-12 (CYANOCOBALAMIN) tablet 1,000 mcg (has no administration in time range)   stroke: mapping our early stages of recovery book (has no administration in time range)  0.9 %  sodium chloride infusion (has no administration in time range)  acetaminophen (TYLENOL) tablet 650 mg (has no administration in time range)    Or  acetaminophen (TYLENOL) solution 650 mg (has no administration in time range)    Or  acetaminophen (TYLENOL) suppository 650 mg (has no administration in time range)  senna-docusate (Senokot-S) tablet 1 tablet (has no administration in time range)  enoxaparin (LOVENOX) injection 40 mg (has no administration in time range)  LORazepam (ATIVAN) injection 1 mg (has no administration in time range)  insulin aspart (novoLOG) injection 0-9 Units (has no administration in time range)  insulin aspart (novoLOG) injection 0-5 Units (has no administration in time range)  ALPRAZolam (XANAX) tablet 0.5 mg (has no administration in time range)     Initial Impression / Assessment and Plan / ED Course  I have reviewed the triage vital signs and the nursing notes.  Pertinent labs & imaging results that were available during my care of the patient were reviewed by me and considered in my medical decision making (see chart for details).     83 year old male with past medical history of CAD, CHF, CKD, hypertension, hyperlipidemia presents for onset of right arm weakness that began 8:15 AM this morning.  He did not wake up with noticing the symptoms.  He had weakness in his arm when he tried to brush his hair.  [place] ED there.  CT of the head reviewed showed no acute findings there but they wanted him admitted for stroke work-up including MRI.  Patient left AMA and wanted to instead come to ED in [place]  here where they reside.  They noticed that he had some right leg weakness in the meantime before arrival here.  Denies any facial droop or slurring of speech.  He was given aspirin at [facility] ED.  On physical exam patient has no facial droop or asymmetry noted. He has minimal weakness in grip strength on RUE with difficulty fully reaching overhead. No pronator drift or abnormal coordination noted. No decreased sensation noted. CT from earlier this morning reviewed. Neurology recommends MR brain/MRA head and hospital admission for further workup. Will consult hospitalist for admission. Of note, patient requests "getting knocked out" for MRI due to severe claustrophobia.  Portions of this note were generated with [employer]. Dictation errors may occur despite best attempts at proofreading.   Final Clinical Impressions(s) / ED Diagnoses   Final diagnoses:  Right sided weakness    ED Discharge Orders    None       Jakeya Gherardi,  PA-C 10/13/17 2129    Pattricia Boss, MD 10/14/17 [phone]

## 2017-10-13 NOTE — Progress Notes (Signed)
Pt back to room from MRI. P. Amo Kelsei Defino RN °

## 2017-10-13 NOTE — H&P (Addendum)
History and Physical    Marco Acevedo JTT:[id number] DOB: 06-Mar-1935 DOA: 10/13/2017  PCP: Lawerance Cruel, MD Patient coming from: home  Chief Complaint: right sided weakness.   HPI: Marco Acevedo is a 83 y.o. male with medical history significant for hypertension, hyperlipidemia, GERD, diabetes, COPD not on home oxygen, chronic kidney disease stage [name], chronic diastolic heart failure, CAD that is post stents 2012, BPH presents emergency Department chief complaint right-sided weakness. [facility] are asked to admit for stroke workup  Formation is obtained from the patient and his son who is at the bedside. [place] He awakened around 8:00. As he was getting way for the day he noticed some weakness in his right arm. He tried to comb his hair and states that his arm would not "do what I wanted to do". He had difficulty lifting it above shoulder height in the hand would not hold the kind. He was taken to the emergency department at [facility]. CT of the head showed no acute findings. Patient was told that he should be admitted to the hospital for further stroke workup he opted to leave AMA and come to [facility]. During that time the son also noted that patient's right leg seems to be "dragging". No reports of any difficulties chewing or swallowing no reports of facial droop slurred speech. At the time of admission right upper extremity strength seems to be improving. He denies headache visual disturbances dizziness syncope or near-syncope. He denies chest pain palpitations shortness of breath. He denies abdominal pain nausea vomiting diarrhea constipation melena bright red blood per rectum. He does have a chronic numbness of that right hand which is at baseline related to C-spine injury.    ED Course: in the emergency department is afebrile hemodynamically stable and not hypoxic.  Review of Systems: As per HPI  otherwise all other systems reviewed and are negative.   Ambulatory Status: ambulates independently is independent with ADLs  Past Medical History:  Diagnosis Date  . Anemia    low iron  . BPH (benign prostatic hyperplasia)   . Bruises easily   . CAD (coronary artery disease)    a. per Dr. Thompson Caul note, prior stenting of Cx in 2007 and 2012. b. DES to Cx in 09/2012.  [name] Chronic diastolic CHF (congestive heart failure) ([place])   . CKD (chronic kidney disease), stage [name] ([place])   . Claustrophobia   . Complication of anesthesia    has to have head elevated to keep from strangling on post nasal drip  . COPD (chronic obstructive pulmonary disease) ([place])   . Diabetes mellitus type II    type 2  . Diarrhea 2015   had for a year and a half  . GERD (gastroesophageal reflux disease)   . Gout   . Granuloma annulare   . History of hiatal hernia   . History of kidney stones   . Hyperlipidemia   . Hypertension   . Neuropathy   . Obesity   . OSA (obstructive sleep apnea)   . Right sided weakness   . Wears dentures    top  . Wears glasses     Past Surgical History:  Procedure Laterality Date  . BLEPHAROPLASTY    . CARDIAC CATHETERIZATION  [phone]   X 2 stents  . CHOLECYSTECTOMY  09/23/2014  . CHOLECYSTECTOMY N/A 09/23/2014   Procedure: LAPAROSCOPIC CHOLECYSTECTOMY WITH INTRAOPERATIVE CHOLANGIOGRAM;  Surgeon: Autumn Messing [name], MD;  Location: [facility] OR;  Service: General;  Laterality: N/A;  . COLONOSCOPY    . COLONOSCOPY WITH PROPOFOL N/A 03/22/2016   Procedure: COLONOSCOPY WITH PROPOFOL;  Surgeon: Wilford Corner, MD;  Location: [facility] ENDOSCOPY;  Service: Endoscopy;  Laterality: N/A;  . ESOPHAGOGASTRODUODENOSCOPY (EGD) WITH PROPOFOL N/A 03/22/2016   Procedure: ESOPHAGOGASTRODUODENOSCOPY (EGD) WITH PROPOFOL;  Surgeon: Wilford Corner, MD;  Location: [facility] ENDOSCOPY;  Service: Endoscopy;  Laterality: N/A;  . EYE SURGERY     both cataracts  . KNEE ARTHROSCOPY WITH MEDIAL MENISECTOMY Right 08/19/2013    Procedure: RIGHT KNEE ARTHROSCOPY WITH PARTIAL MEDIAL MENISECTOMY AND CHONDROPLASTY;  Surgeon: Hessie Dibble, MD;  Location: [place];  Service: Orthopedics;  Laterality: Right;  . LEFT HEART CATH AND CORONARY ANGIOGRAPHY N/A 06/28/2016   Procedure: Left Heart Cath and Coronary Angiography;  Surgeon: Belva Crome, MD;  Location: [place] CV LAB;  Service: Cardiovascular;  Laterality: N/A;  . LEFT HEART CATHETERIZATION WITH CORONARY ANGIOGRAM N/A 10/03/2011   Procedure: LEFT HEART CATHETERIZATION WITH CORONARY ANGIOGRAM;  Surgeon: Sinclair Grooms, MD;  Location: [facility] CATH LAB;  Service: Cardiovascular;  Laterality: N/A;  . LEFT HEART CATHETERIZATION WITH CORONARY ANGIOGRAM N/A 09/12/2012   Procedure: LEFT HEART CATHETERIZATION WITH CORONARY ANGIOGRAM;  Surgeon: Sinclair Grooms, MD;  Location: [facility] CATH LAB;  Service: Cardiovascular;  Laterality: N/A;  . LIPOMA EXCISION     Biospy only; left parotid gland  . none    . PERCUTANEOUS CORONARY STENT INTERVENTION (PCI-S) N/A 09/17/2012   Procedure: PERCUTANEOUS CORONARY STENT INTERVENTION (PCI-S);  Surgeon: Sinclair Grooms, MD;  Location: [facility] CATH LAB;  Service: Cardiovascular;  Laterality: N/A;  . TRIGGER FINGER RELEASE Left 12/21/2015   Procedure: LEFT LONG FINGER TRIGGER RELEASE ;  Surgeon: Milly Jakob, MD;  Location: [place];  Service: Orthopedics;  Laterality: Left;    Social History   Socioeconomic History  . Marital status: Married    Spouse name: Not on file  . Number of children: Not on file  . Years of education: Not on file  . Highest education level: Not on file  Occupational History  . Occupation: retired  Scientific laboratory technician  . Financial resource strain: Not on file  . Food insecurity:    Worry: Not on file    Inability: Not on file  . Transportation needs:    Medical: Not on file    Non-medical: Not on file  Tobacco Use  . Smoking status: Former Smoker    Packs/day: 1.00    Years: 64.00     Pack years: 64.00    Types: Cigarettes    Last attempt to quit: 05/29/2011    Years since quitting: 6.3  . Smokeless tobacco: Never Used  Substance and Sexual Activity  . Alcohol use: Yes    Comment: rarely  . Drug use: No  . Sexual activity: Not Currently  Lifestyle  . Physical activity:    Days per week: Not on file    Minutes per session: Not on file  . Stress: Not on file  Relationships  . Social connections:    Talks on phone: Not on file    Gets together: Not on file    Attends religious service: Not on file    Active member of club or organization: Not on file    Attends meetings of clubs or organizations: Not on file    Relationship status: Not on file  . Intimate partner violence:    Fear  of current or ex partner: Not on file    Emotionally abused: Not on file    Physically abused: Not on file    Forced sexual activity: Not on file  Other Topics Concern  . Not on file  Social History Narrative   Denies Caffeine use     Allergies  Allergen Reactions  . Cimetidine Other (See Comments)    Anxiety Attacks    Family History  Problem Relation Age of Onset  . Aortic aneurysm Father   . Aneurysm Mother        brain  . Cancer Brother   . COPD Sister   . Other Sister        health hx unknown  . Other Sister        health hx unknown  . Other Brother        health hx unknown    Prior to Admission medications   Medication Sig Start Date End Date Taking? Authorizing Provider  albuterol (PROVENTIL HFA) 108 (90 BASE) MCG/ACT inhaler Inhale 2 puffs into the lungs every 6 (six) hours as needed for wheezing or shortness of breath.    Yes [provider]  albuterol (PROVENTIL) (2.5 MG/3ML) 0.083% nebulizer solution Take 2.5 mg by nebulization every 6 (six) hours as needed for wheezing or shortness of breath.    Yes [provider]  allopurinol (ZYLOPRIM) 100 MG tablet Take 100 mg by mouth daily.   Yes [provider]  aspirin EC 81 MG  tablet Take 81 mg by mouth daily.   Yes [provider]  docusate sodium (STOOL SOFTENER) 100 MG capsule Take 100 mg by mouth daily.   Yes [provider]  Ferrous Sulfate (IRON) 325 (65 FE) MG TABS Take 1 tablet by mouth daily.   Yes [provider]  finasteride (PROSCAR) 5 MG tablet Take 1 tablet (5 mg total) by mouth daily. 11/29/11  Yes Rigoberto Noel, MD  glipiZIDE (GLUCOTROL) 5 MG tablet Take 1 tablet (5 mg total) by mouth daily before breakfast. 06/30/16  Yes Barrett, Evelene Croon, PA-C  guaifenesin (HUMIBID E) 400 MG TABS tablet Take 400 mg by mouth at bedtime.    Yes [provider]  isosorbide mononitrate (IMDUR) 120 MG 24 hr tablet Take 1 tablet (120 mg total) by mouth daily. 11/20/14  Yes Belva Crome, MD  losartan (COZAAR) 50 MG tablet Take 50 mg by mouth daily.    Yes [provider]  nitroGLYCERIN (NITROSTAT) 0.4 MG SL tablet Place 0.4 mg under the tongue every 5 (five) minutes as needed for chest pain.   Yes [provider]  pravastatin (PRAVACHOL) 40 MG tablet Take 40 mg by mouth daily.   Yes [provider]  primidone (MYSOLINE) 50 MG tablet Take 50 mg by mouth at bedtime.    Yes [provider]  ranitidine (ZANTAC) 150 MG tablet Take 150 mg by mouth 2 (two) times daily.   Yes [provider]  traMADol (ULTRAM) 50 MG tablet Take 50 mg by mouth at bedtime as needed for moderate pain.   Yes [provider]  vitamin B-12 (CYANOCOBALAMIN) 1000 MCG tablet Take 1,000 mcg by mouth daily.   Yes [provider]    Physical Exam: Vitals:   10/13/17 1515 10/13/17 1530 10/13/17 1545 10/13/17 1600  BP: 115/69 114/64 135/72 134/87  Pulse: 72 71 68 71  Resp: 18 (!) 21 18 16   Temp:      TempSrc:  SpO2: 94% 90% 93% 94%     General:  Appears calm and comfortable being up in bed in no acute distress Eyes:  PERRL, EOMI, normal lids, iris ENT:  grossly normal hearing, lips & tongue, mucous  membranes of his mouth are moist and pink Neck:  no LAD, masses or thyromegaly Cardiovascular:  RRR, no m/r/g. No LE edema. Pedal pulses present and palpable Respiratory:  CTA bilaterally, no w/r/r. Normal respiratory effort. Abdomen:  soft, ntnd, obese positive bowel sounds throughout no guarding or rebounding Skin:  no rash or induration seen on limited exam Musculoskeletal:  grossly normal tone BUE/BLE, good ROM, no bony abnormality Psychiatric:  grossly normal mood and affect, speech fluent and appropriate, AOx3 Neurologic:  CN 2-12 grossly intact, moves all extremities in coordinated fashion, sensation intact these clear facial symmetry right grip 3 out of 5 left grip 5 out of 5 lower extremity strength bilaterally 5 out of 5 tongue midline slight pronator drift  Labs on Admission: I have personally reviewed following labs and imaging studies  CBC: Recent Labs  Lab 10/13/17 1528 10/13/17 1539  WBC 9.3  --   NEUTROABS 6.5  --   HGB 12.7* 12.2*  HCT 38.8* 36.0*  MCV 89.6  --   PLT 256  --    Basic Metabolic Panel: Recent Labs  Lab 10/13/17 1539  NA 134*  K 4.9  CL 102  GLUCOSE 160*  BUN 52*  CREATININE 3.30*   GFR: CrCl cannot be calculated (Unknown ideal weight.). Liver Function Tests: No results for input(s): AST, ALT, ALKPHOS, BILITOT, PROT, ALBUMIN in the last 168 hours. No results for input(s): LIPASE, AMYLASE in the last 168 hours. No results for input(s): AMMONIA in the last 168 hours. Coagulation Profile: Recent Labs  Lab 10/13/17 1528  INR 1.00   Cardiac Enzymes: No results for input(s): CKTOTAL, CKMB, CKMBINDEX, TROPONINI in the last 168 hours. BNP (last 3 results) No results for input(s): PROBNP in the last 8760 hours. HbA1C: No results for input(s): HGBA1C in the last 72 hours. CBG: No results for input(s): GLUCAP in the last 168 hours. Lipid Profile: No results for input(s): CHOL, HDL, LDLCALC, TRIG, CHOLHDL, LDLDIRECT in the last 72  hours. Thyroid Function Tests: No results for input(s): TSH, T4TOTAL, FREET4, T3FREE, THYROIDAB in the last 72 hours. Anemia Panel: No results for input(s): VITAMINB12, FOLATE, FERRITIN, TIBC, IRON, RETICCTPCT in the last 72 hours. Urine analysis: No results found for: COLORURINE, APPEARANCEUR, LABSPEC, PHURINE, GLUCOSEU, HGBUR, BILIRUBINUR, KETONESUR, PROTEINUR, UROBILINOGEN, NITRITE, LEUKOCYTESUR  Creatinine Clearance: CrCl cannot be calculated (Unknown ideal weight.).  Sepsis Labs: @LABRCNTIP (procalcitonin:4,lacticidven:4) )No results found for this or any previous visit (from the past 240 hour(s)).   Radiological Exams on Admission: No results found.  EKG: Independently reviewed. Normal sinus rhythm Normal ECG  Assessment/Plan Principal Problem:   Right sided weakness Active Problems:   COPD (chronic obstructive pulmonary disease) (HCC)   OSA (obstructive sleep apnea)   Coronary artery disease involving native coronary artery of native heart with angina pectoris (HCC)   CKD (chronic kidney disease) stage 3, GFR 30-59 ml/min (HCC)   Essential hypertension    #1. Right-sided weakness concern for stroke. Reportedly CT taken at [facility] this morning was negative. Risk factors include hypertension and hyperlipidemia diabetes and slightly improved since onset. He is outside the window for tPA. EDP has requested consult of neuro -Admit to telemetry -MRI MRA of the brain with Ativan as patient is claustrophobic -2-D echo -Carotid Dopplers -Obtain a  hemoglobin A1c and lipid panel -Continue aspirin and statin -Physical therapy occupational therapy -He has passive bedside swallow eval so we'll provide a carb modified heart healthy -await neurology recommendations  #2. Hypertension. The pressure controlled. Home medications include Cozaar and imdur -will hold these meds for now -monitor  3. Diabetes. Oral agents are included in his home medications.serum glucose  160. -Obtain a hemoglobin A1c -Sliding scale insulin for optimal control  #4. CAD. No chest pain. History of stents 2007 2012. EKG as noted above. Initial troponin negative. Home meds include imdur -holding imdur for now -monitor  5. Chronic kidney disease stage [name]. Creatinine 3.3. Chart review indicates creatinine closer to 2 a year ago. -Hold nephrotoxins -Monitor urine output -Recheck in the morning  6. COPD. Not on home oxygen. Appears to be stable at baseline.oxygen saturation level greater then 90% on room air -Continue home inhalers and nebulizers as needed    DVT prophylaxis: lovenox  Code Status: full  Family Communication: son at bedside  Disposition Plan: home  Consults called: neuro per EDP Admission status: obs   Radene Gunning MD [facility]  If 7PM-7AM, please contact night-coverage [email or web address] Password [facility]  10/13/2017, 4:25 PM    Attestation:  Patient has been seen and examined discussed at length with patient and  family.  Agree with Mrs. Debbora Lacrosse ANP exam, assessment and Plan.   Chief Complaint: right side weakness  HPI: Marco Acevedo is a 83 y.o. male with medical history significant of  hypertension, hyperlipidemia, GERD, diabetes, COPD not on home oxygen, chronic kidney disease stage [name], chronic diastolic heart failure, CAD that is post stents 2012, BPH presents emergency Department chief complaint right-sided weakness. [facility] are asked to admit for stroke workup      Presented with  Right side weakness since 8 am some improvement but did not completely resolve otherwise no associated chest pain shortness of breath no headache no prior episodes similar to that     IN ER:  Temp (24hrs), Avg:97.4 F (36.3 C), Min:97.4 F (36.3 C), Max:97.4 F (36.3 C)      on arrival  ED Triage Vitals  Enc Vitals Group     BP 10/13/17 1407 104/61     Pulse Rate 10/13/17 1407 79     Resp 10/13/17 1407 16     Temp 10/13/17  1407 (!) 97.4 F (36.3 C)     Temp Source 10/13/17 1407 Oral     SpO2 10/13/17 1407 90 %     Weight --      Height --      Head Circumference --      Peak Flow --      Pain Score 10/13/17 1421 0     Pain Loc --      Pain Edu? --      Excl. in [place]? --        Following Medications were ordered in ER: Medications  albuterol (PROVENTIL) (2.5 MG/3ML) 0.083% nebulizer solution 3 mL (has no administration in time range)  allopurinol (ZYLOPRIM) tablet 100 mg (has no administration in time range)  aspirin EC tablet 81 mg (has no administration in time range)  docusate sodium (COLACE) capsule 100 mg (has no administration in time range)  Iron TABS 325 mg (has no administration in time range)  finasteride (PROSCAR) tablet 5 mg (has no administration in time range)  guaifenesin (HUMIBID E) tablet 400 mg (has no administration in time range)  isosorbide mononitrate (IMDUR) 24 hr  tablet 120 mg (has no administration in time range)  pravastatin (PRAVACHOL) tablet 40 mg (has no administration in time range)  primidone (MYSOLINE) tablet 50 mg (has no administration in time range)  traMADol (ULTRAM) tablet 50 mg (has no administration in time range)  vitamin B-12 (CYANOCOBALAMIN) tablet 1,000 mcg (has no administration in time range)   stroke: mapping our early stages of recovery book (has no administration in time range)  0.9 %  sodium chloride infusion (has no administration in time range)  acetaminophen (TYLENOL) tablet 650 mg (has no administration in time range)    Or  acetaminophen (TYLENOL) solution 650 mg (has no administration in time range)    Or  acetaminophen (TYLENOL) suppository 650 mg (has no administration in time range)  senna-docusate (Senokot-S) tablet 1 tablet (has no administration in time range)  enoxaparin (LOVENOX) injection 40 mg (has no administration in time range)  LORazepam (ATIVAN) injection 1 mg (has no administration in time range)  insulin aspart (novoLOG)  injection 0-9 Units (has no administration in time range)  insulin aspart (novoLOG) injection 0-5 Units (has no administration in time range)  ALPRAZolam (XANAX) tablet 0.5 mg (has no administration in time range)     I discussed case with: Neurology who recommends: MRI We'll see patient in consult    Hospitalist was called for admission for right-sided weakness possible CVA/TIA    Physical Exam: Patient Vitals for the past 24 hrs:  BP Temp Temp src Pulse Resp SpO2  10/13/17 1737 137/77 - - 71 20 97 %  10/13/17 1645 127/83 - - 70 16 95 %  10/13/17 1600 134/87 - - 71 16 94 %  10/13/17 1545 135/72 - - 68 18 93 %  10/13/17 1530 114/64 - - 71 (!) 21 90 %  10/13/17 1515 115/69 - - 72 18 94 %  10/13/17 1407 104/61 (!) 97.4 F (36.3 C) Oral 79 16 90 %    1. General:  in No Acute distress   Chronically ill  -appearing 2. Psychological: Alert and  Oriented 3. Head/ENT:    Dry Mucous Membranes                          Head Non traumatic, neck supple                            Poor Dentition 4. SKIN:   decreased Skin turgor,  Skin clean Dry and intact no rash 5. Heart: Regular rate and rhythm no Murmur, no Rub or gallop 6. Lungs:  Clear to auscultation bilaterally, no wheezes or crackles   7. Abdomen: Soft,  non-tender, Non distended obese bowel sounds present 8. Lower extremities: no clubbing, cyanosis, or edema 9. Neurologically   Strength 3 out of 5 on the right in all 4 extremities cranial nerves II through XII intact 10. MSK: Normal range of motion    Labs on Admission: I have personally reviewed following labs and imaging studies   Assessment 83 y.o. male with medical history significant for hypertension, hyperlipidemia, GERD, diabetes, COPD not on home oxygen, chronic kidney disease stage [name], chronic diastolic heart failure, CAD that is post stents 2012, BPH presents emergency Department chief complaint right-sided weakness. [facility] are asked to admit for stroke  workup  Plan  Present on Admission: Right side weakness -  - will admit based on TIA/CVA protocol, await results of MRA/MRI, Carotid Doppler and  Echo, obtain cardiac enzymes,  ECG,   Lipid panel, TSH. Order PT/OT evaluation. Will make sure patient is on antiplatelet agent.   Neurology consult.        . Essential hypertension -allow permissive hypertensive     Otherwise see APP note for details Athanasios Heldman 5:48 PM

## 2017-10-14 ENCOUNTER — Observation Stay (HOSPITAL_COMMUNITY): Payer: Medicare Other

## 2017-10-14 ENCOUNTER — Encounter (HOSPITAL_COMMUNITY): Payer: Medicare Other

## 2017-10-14 DIAGNOSIS — Z955 Presence of coronary angioplasty implant and graft: Secondary | ICD-10-CM | POA: Diagnosis not present

## 2017-10-14 DIAGNOSIS — E785 Hyperlipidemia, unspecified: Secondary | ICD-10-CM | POA: Diagnosis present

## 2017-10-14 DIAGNOSIS — Z7982 Long term (current) use of aspirin: Secondary | ICD-10-CM | POA: Diagnosis not present

## 2017-10-14 DIAGNOSIS — Z87891 Personal history of nicotine dependence: Secondary | ICD-10-CM | POA: Diagnosis not present

## 2017-10-14 DIAGNOSIS — I6522 Occlusion and stenosis of left carotid artery: Secondary | ICD-10-CM | POA: Diagnosis not present

## 2017-10-14 DIAGNOSIS — E669 Obesity, unspecified: Secondary | ICD-10-CM | POA: Diagnosis present

## 2017-10-14 DIAGNOSIS — I5032 Chronic diastolic (congestive) heart failure: Secondary | ICD-10-CM | POA: Diagnosis present

## 2017-10-14 DIAGNOSIS — F40240 Claustrophobia: Secondary | ICD-10-CM | POA: Diagnosis present

## 2017-10-14 DIAGNOSIS — Z6832 Body mass index (BMI) 32.0-32.9, adult: Secondary | ICD-10-CM | POA: Diagnosis not present

## 2017-10-14 DIAGNOSIS — N185 Chronic kidney disease, stage 5: Secondary | ICD-10-CM | POA: Diagnosis present

## 2017-10-14 DIAGNOSIS — Z7984 Long term (current) use of oral hypoglycemic drugs: Secondary | ICD-10-CM | POA: Diagnosis not present

## 2017-10-14 DIAGNOSIS — G4733 Obstructive sleep apnea (adult) (pediatric): Secondary | ICD-10-CM | POA: Diagnosis present

## 2017-10-14 DIAGNOSIS — N179 Acute kidney failure, unspecified: Secondary | ICD-10-CM | POA: Diagnosis present

## 2017-10-14 DIAGNOSIS — Z79899 Other long term (current) drug therapy: Secondary | ICD-10-CM | POA: Diagnosis not present

## 2017-10-14 DIAGNOSIS — I639 Cerebral infarction, unspecified: Secondary | ICD-10-CM | POA: Diagnosis not present

## 2017-10-14 DIAGNOSIS — E1142 Type 2 diabetes mellitus with diabetic polyneuropathy: Secondary | ICD-10-CM | POA: Diagnosis present

## 2017-10-14 DIAGNOSIS — I350 Nonrheumatic aortic (valve) stenosis: Secondary | ICD-10-CM | POA: Diagnosis not present

## 2017-10-14 DIAGNOSIS — G8191 Hemiplegia, unspecified affecting right dominant side: Secondary | ICD-10-CM | POA: Diagnosis present

## 2017-10-14 DIAGNOSIS — I63512 Cerebral infarction due to unspecified occlusion or stenosis of left middle cerebral artery: Secondary | ICD-10-CM | POA: Diagnosis present

## 2017-10-14 DIAGNOSIS — N400 Enlarged prostate without lower urinary tract symptoms: Secondary | ICD-10-CM | POA: Diagnosis present

## 2017-10-14 DIAGNOSIS — E1122 Type 2 diabetes mellitus with diabetic chronic kidney disease: Secondary | ICD-10-CM | POA: Diagnosis present

## 2017-10-14 DIAGNOSIS — R531 Weakness: Secondary | ICD-10-CM | POA: Diagnosis not present

## 2017-10-14 DIAGNOSIS — I2510 Atherosclerotic heart disease of native coronary artery without angina pectoris: Secondary | ICD-10-CM | POA: Diagnosis present

## 2017-10-14 DIAGNOSIS — J449 Chronic obstructive pulmonary disease, unspecified: Secondary | ICD-10-CM | POA: Diagnosis present

## 2017-10-14 DIAGNOSIS — R29703 NIHSS score 3: Secondary | ICD-10-CM | POA: Diagnosis present

## 2017-10-14 DIAGNOSIS — I132 Hypertensive heart and chronic kidney disease with heart failure and with stage 5 chronic kidney disease, or end stage renal disease: Secondary | ICD-10-CM | POA: Diagnosis present

## 2017-10-14 DIAGNOSIS — K219 Gastro-esophageal reflux disease without esophagitis: Secondary | ICD-10-CM | POA: Diagnosis present

## 2017-10-14 LAB — GLUCOSE, CAPILLARY
GLUCOSE-CAPILLARY: 151 mg/dL — AB (ref 70–99)
GLUCOSE-CAPILLARY: 240 mg/dL — AB (ref 70–99)
GLUCOSE-CAPILLARY: 139 mg/dL — AB (ref 70–99)
Glucose-Capillary: 139 mg/dL — ABNORMAL HIGH (ref 70–99)

## 2017-10-14 LAB — LIPID PANEL
CHOL/HDL RATIO: 5.6 ratio
Cholesterol: 220 mg/dL — ABNORMAL HIGH (ref 0–200)
HDL: 39 mg/dL — AB (ref >40)
LDL Cholesterol: 150 mg/dL — ABNORMAL HIGH (ref 0–99)
Triglycerides: 156 mg/dL — ABNORMAL HIGH (ref <150)
VLDL: 31 mg/dL (ref 0–40)

## 2017-10-14 LAB — ECHOCARDIOGRAM COMPLETE
Height: 68 in
WEIGHTICAEL: 3424 [oz_av]

## 2017-10-14 LAB — HEMOGLOBIN A1C
Hgb A1c MFr Bld: 7 % — ABNORMAL HIGH (ref 4.8–5.6)
MEAN PLASMA GLUCOSE: 154.2 mg/dL

## 2017-10-14 MED ORDER — CLOPIDOGREL BISULFATE 75 MG PO TABS
75.0000 mg | ORAL_TABLET | Freq: Every day | ORAL | Status: DC
  Administered 2017-10-14 – 2017-10-15 (×2): 75 mg via ORAL
  Filled 2017-10-14 (×2): qty 1

## 2017-10-14 MED ORDER — ATORVASTATIN CALCIUM 80 MG PO TABS
80.0000 mg | ORAL_TABLET | Freq: Every day | ORAL | Status: DC
  Administered 2017-10-14: 80 mg via ORAL
  Filled 2017-10-14: qty 1

## 2017-10-14 NOTE — Progress Notes (Signed)
Received verbal order from Dr. Candiss Norse, MD to discontinue sliding scale insulin, since pt refused.

## 2017-10-14 NOTE — Progress Notes (Signed)
*  PRELIMINARY RESULTS* Echocardiogram 2D Echocardiogram has been performed.  Leavy Cella 10/14/2017, 3:58 PM

## 2017-10-14 NOTE — Evaluation (Addendum)
Occupational Therapy Evaluation Patient Details Name: Marco Acevedo MRN: [id number] DOB: [birth year] Today's Date: 10/14/2017    History of Present Illness Pt is a 83 y.o. M with significant PMH of hypertension, hyperlipidemia, GERD, diabetes, COPD not on home oxygen, chronic kidney disease stage [name], chronic diastolic heart failure, CAD, BPH, who presents with right sided weakness. MRI shows small acute left MCA territory infarcts and small chronic left MCA infarcts. NIHSS: 3.   Clinical Impression   PTA, pt was independent with ADL and functional mobility. He currently presents with R UE decreased strength, gross motor coordination, and fine motor coordination impacting his functional independence with ADL, IADL, and work related tasks. He requires min guard assist for LB dressing tasks and supervision for toilet transfers. He demonstrates difficulty opening containers during grooming tasks. Pt would benefit from continued OT services while admitted to improve independence and safety with ADL and functional mobility. Recommend follow-up with neuro-outpatient OT services to maximize functional use of RUE.     Follow Up Recommendations  Outpatient OT(Neuro-outpatient OT)    Equipment Recommendations  None recommended by OT    Recommendations for Other Services       Precautions / Restrictions Precautions Precautions: Fall Restrictions Weight Bearing Restrictions: No      Mobility Bed Mobility Overal bed mobility: Modified Independent             General bed mobility comments: OOB in chair  Transfers Overall transfer level: Needs assistance   Transfers: Sit to/from Stand Sit to Stand: Supervision         General transfer comment: For safety    Balance Overall balance assessment: Needs assistance Sitting-balance support: No upper extremity supported;Feet supported Sitting balance-Leahy Scale: Normal     Standing balance support: No upper extremity supported;During  functional activity Standing balance-Leahy Scale: Good                   Standardized Balance Assessment Standardized Balance Assessment : Dynamic Gait Index   Dynamic Gait Index Level Surface: Mild Impairment Change in Gait Speed: Normal Gait with Horizontal Head Turns: Normal Gait with Vertical Head Turns: Normal Gait and Pivot Turn: Mild Impairment Step Over Obstacle: Mild Impairment Step Around Obstacles: Mild Impairment Steps: Mild Impairment Total Score: 19     ADL either performed or assessed with clinical judgement   ADL Overall ADL's : Needs assistance/impaired Eating/Feeding: Set up;Sitting   Grooming: Min guard;Standing Grooming Details (indicate cue type and reason): increased time with opening/closing containers Upper Body Bathing: Supervision/ safety;Sitting   Lower Body Bathing: Min guard;Sit to/from stand   Upper Body Dressing : Supervision/safety;Sitting   Lower Body Dressing: Min guard;Sit to/from stand   Toilet Transfer: Supervision/safety;Ambulation Toilet Transfer Details (indicate cue type and reason): Some gait instability noted.  Toileting- Clothing Manipulation and Hygiene: Supervision/safety;Sit to/from stand       Functional mobility during ADLs: Supervision/safety General ADL Comments: Supervision for safety. Pt's greatest limitation is R UE fine motor coordination and gross motor coordination impacting his ability to participate in ADL, IADL, and work related tasks.      Vision Baseline Vision/History: Wears glasses Wears Glasses: At all times Patient Visual Report: No change from baseline Vision Assessment?: Yes Eye Alignment: Within Functional Limits Ocular Range of Motion: Within Functional Limits Alignment/Gaze Preference: Within Defined Limits Tracking/Visual Pursuits: Able to track stimulus in all quads without difficulty Saccades: Within functional limits Convergence: Within functional limits Visual Fields: No apparent  deficits     Perception  Praxis      Pertinent Vitals/Pain Pain Assessment: No/denies pain     Hand Dominance Right   Extremity/Trunk Assessment Upper Extremity Assessment Upper Extremity Assessment: RUE deficits/detail(pt reports tremors at baseline (R>L)) RUE Deficits / Details: Poor eccentric control, decreased fine motor coordination. AROM Functional.  RUE Sensation: decreased proprioception RUE Coordination: decreased fine motor;decreased gross motor   Lower Extremity Assessment Lower Extremity Assessment: Defer to PT evaluation RLE Deficits / Details: 5/5 RLE Sensation: history of peripheral neuropathy LLE Deficits / Details: 5/5 LLE Sensation: history of peripheral neuropathy   Cervical / Trunk Assessment Cervical / Trunk Assessment: Other exceptions Cervical / Trunk Exceptions: Forward head posture   Communication Communication Communication: No difficulties   Cognition Arousal/Alertness: Awake/alert Behavior During Therapy: WFL for tasks assessed/performed Overall Cognitive Status: Within Functional Limits for tasks assessed                                     General Comments  Wife and son present at end of session.    Exercises Exercises: Other exercises Other Exercises Other Exercises: Fine motor coordination activities with handout provided: composite flexion/extension of R digits, R digit abduction/adduction, R digit lifts, R thumb circles, and in-hand manipulation and pick up and place. Family providing coins for in-hand manipulation.    Shoulder Instructions      Home Living Family/patient expects to be discharged to:: Private residence Living Arrangements: Spouse/significant other Available Help at Discharge: Family(son, daughter-in-law) Type of Home: House Home Access: Level entry     Home Layout: One level     Bathroom Shower/Tub: Occupational psychologist: Standard     Home Equipment: Environmental consultant - 2 wheels;Cane -  single point;Shower seat - built in   Additional Comments: Lives in an accessible condo      Prior Functioning/Environment Level of Independence: Independent        Comments: Plays golf twice a week and is the caregiver for his wife        OT Problem List: Decreased activity tolerance;Impaired balance (sitting and/or standing);Decreased coordination;Impaired UE functional use      OT Treatment/Interventions: Self-care/ADL training;Therapeutic exercise;Energy conservation;DME and/or AE instruction;Therapeutic activities;Patient/family education;Balance training    OT Goals(Current goals can be found in the care plan section) Acute Rehab OT Goals Patient Stated Goal: get back to golf OT Goal Formulation: With patient Time For Goal Achievement: 10/28/17 Potential to Achieve Goals: Good ADL Goals Pt/caregiver will Perform Home Exercise Program: Right Upper extremity;With written HEP provided;Independently(RUE increased gross and fine motor) Additional ADL Goal #1: Pt will demonstrate improved fine motor coordination to manipulate a variety of fasteners independently during dressing tasks.  OT Frequency: Min 1X/week   Barriers to D/C:            Co-evaluation              AM-PAC PT "6 Clicks" Daily Activity     Outcome Measure Help from another person eating meals?: None Help from another person taking care of personal grooming?: A Little Help from another person toileting, which includes using toliet, bedpan, or urinal?: A Little Help from another person bathing (including washing, rinsing, drying)?: A Little Help from another person to put on and taking off regular upper body clothing?: None Help from another person to put on and taking off regular lower body clothing?: A Little 6 Click Score: 20   End of Session  Nurse Communication: Mobility status  Activity Tolerance: Patient tolerated treatment well Patient left: in chair;with family/visitor present  OT Visit  Diagnosis: Other abnormalities of gait and mobility (R26.89);Hemiplegia and hemiparesis Hemiplegia - Right/Left: Right Hemiplegia - dominant/non-dominant: Dominant Hemiplegia - caused by: Cerebral infarction                Time: 1005-1032 OT Time Calculation (min): 27 min Charges:  OT General Charges $OT Visit: 1 Visit OT Evaluation $OT Eval Moderate Complexity: 1 Mod OT Treatments $Therapeutic Activity: 8-22 mins G-Codes:     Norman Herrlich, MS OTR/L  Pager: [place] A Kieron Kantner 10/14/2017, 12:27 PM

## 2017-10-14 NOTE — Progress Notes (Signed)
STROKE TEAM PROGRESS NOTE   HISTORY OF PRESENT ILLNESS (per record) Marco Acevedo is an 83 y.o. male  With PMH significant for HTN, HLD, DM, CAD post 2 stents 2012 who presented to [facility] ED with right sided weakness.   Patient woke up this morning in his usual state of health about 0815. When he went to comb his hair about 15 minutes later he noticed that he didn't have control of his right arm. He described his arm as being floppy, and he was unable to lift the right arm above the level of his shoulder. Patient had tingling in the back of his neck that was present at the time of onset. This numbness is not new. Patient states he has numbness in his neck due to his disk degeneration. denies any numbness, tingling in face or arms. Denies any leg weakness,dizziness, room spinning, HA, visual problems, slurred speech, CP, SOB( outside of his usual shortness of breath), problems walking. Son states that he did notice that his father appeared to be dragging his right leg when he was walking. Patient denies any problems walking. Patient was on vacation and opted to go to [facility]. CT of head at [place] was negative for any acute findings. ( Son has papers from [place] and Disk with Head CT). They chose to leave AMA and come to  which is home for further evaluation. He did receive a baby aspirin at [employer].  He does take a baby aspirin daily.Since leaving [place] patient's son states that his arm strength has gotten better, but it is still not back to normal.   ED course:  CT at [facility] showed no acute abnormality.  MRI Brain: pending, MRA Head and Neck: pending BP on arrival was 104/61, BG: 160, BUN: 52, creatinine: 3.30, NA: 134  No prior history of stroke. Patient has seen a neurologist in 2017 for tremor.   Date last known well: Date: 10/13/2017 Time last known well: Time: 08:15 tPA Given: No: contraindicated, outside of window Modified Rankin: Rankin  Score=1     SUBJECTIVE (INTERVAL HISTORY) The patient's wife was at the bedside.  The patient went over the events that led up to his current admission.  Dr. Leonie Man explained that some of the studies were still pending.    OBJECTIVE Temp:  [97.9 F (36.6 C)-98.7 F (37.1 C)] 98.1 F (36.7 C) (07/07 1104) Pulse Rate:  [61-91] 78 (07/07 1104) Cardiac Rhythm: Normal sinus rhythm (07/07 1200) Resp:  [16-21] 20 (07/07 1104) BP: (111-146)/(58-87) 128/71 (07/07 1104) SpO2:  [90 %-97 %] 95 % (07/07 1104)  CBC:  Recent Labs  Lab 10/13/17 1528 10/13/17 1539  WBC 9.3  --   NEUTROABS 6.5  --   HGB 12.7* 12.2*  HCT 38.8* 36.0*  MCV 89.6  --   PLT 256  --     Basic Metabolic Panel:  Recent Labs  Lab 10/13/17 1528 10/13/17 1539  NA 134* 134*  K 4.9 4.9  CL 103 102  CO2 24  --   GLUCOSE 162* 160*  BUN 46* 52*  CREATININE 3.08* 3.30*  CALCIUM 8.7*  --     Lipid Panel:     Component Value Date/Time   CHOL 220 (H) 10/14/2017 0742   TRIG 156 (H) 10/14/2017 0742   HDL 39 (L) 10/14/2017 0742   CHOLHDL 5.6 10/14/2017 0742   VLDL 31 10/14/2017 0742   LDLCALC 150 (H) 10/14/2017 0742   HgbA1c:  Lab Results  Component Value Date  HGBA1C 7.0 (H) 10/14/2017   Urine Drug Screen:     Component Value Date/Time   LABOPIA NONE DETECTED 10/13/2017 1711   COCAINSCRNUR NONE DETECTED 10/13/2017 1711   LABBENZ NONE DETECTED 10/13/2017 1711   AMPHETMU NONE DETECTED 10/13/2017 1711   THCU NONE DETECTED 10/13/2017 1711   LABBARB (A) 10/13/2017 1711    Result not available. Reagent lot number recalled by manufacturer.    Alcohol Level     Component Value Date/Time   ETH <10 10/13/2017 1528    IMAGING   Dg Chest 2 View 10/13/2017 IMPRESSION:  No active cardiopulmonary disease.   [name] Head Wo Contrast [name] Neck Wo Contrast 10/13/2017 IMPRESSION:  1. Small acute left MCA territory infarcts.  2. Small chronic left MCA infarcts and moderate chronic small vessel ischemic  white matter disease.  3. Motion degraded head MRA without large vessel occlusion or definite flow limiting proximal stenosis.  4. Limited neck MRA due to motion and noncontrast technique. Moderate to severe left ICA origin stenosis.      Transthoracic Echocardiogram - pending 00/00/00    Bilateral Carotid Dopplers - pending 00/00/00     PHYSICAL EXAM Vitals:   10/14/17 0416 10/14/17 0623 10/14/17 0807 10/14/17 1104  BP: 132/72 (!) 146/82 122/64 128/71  Pulse: 72 72 61 78  Resp: 20 18  20   Temp: 98 F (36.7 C) 98 F (36.7 C) 97.9 F (36.6 C) 98.1 F (36.7 C)  TempSrc: Oral Oral Oral Oral  SpO2: 95% 95% 92% 95%   Pleasant obese middle aged Caucasian male not in distress. . Afebrile. Head is nontraumatic. Neck is supple without bruit.    Cardiac exam no murmur or gallop. Lungs are clear to auscultation. Distal pulses are well felt.  Neurological Exam :  Awake  Alert oriented x 3. Normal speech and language.eye movements full without nystagmus.fundi were not visualized. Vision acuity and fields appear normal. Hearing is normal. Palatal movements are normal. Face symmetric. Tongue midline. Normal strength, tone, reflexes and coordination except mild right grip weakness and RUE ataxia. Diminished fine finger movements on right and orbitrs left over right upper extremity. Normal sensation. Gait deferred.     ASSESSMENT/PLAN [name] is a 83 y.o. male with history of remote tobacco use, obstructive sleep apnea, obesity, hypertension, hyperlipidemia, gout, diabetes mellitus, COPD, claustrophobia, previous strokes by imaging, chronic kidney disease, congestive heart failure, and coronary artery disease with previous stents presenting with right-sided weakness. He did not receive IV t-PA due to late presentation.  Strokes:  Small acute left MCA territory infarcts - likely embolic secondary to left internal carotid artery stenosis.  Resultant  Mild RUE weakness and  ataxia  CT head - performed at an outside hospital.  The report is not available at this time.  MRI head - Small acute left MCA territory infarcts.   MRA H&N - Moderate to severe left ICA origin stenosis.   Carotid Doppler - pending  2D Echo - pending  LDL - 150  HgbA1c - 7.0  VTE prophylaxis -  - Lovenox Diet Order           Diet heart healthy/carb modified Room service appropriate? Yes; Fluid consistency: Thin  Diet effective now          aspirin 81 mg daily prior to admission, now on aspirin 81 mg daily and clopidogrel 75 mg daily  Patient counseled to be compliant with his antithrombotic medications  Ongoing aggressive stroke risk factor management  Therapy recommendations: Outpatient occupational therapy recommended  Disposition:  Pending  Hypertension  Stable . Permissive hypertension (OK if < 220/120) but gradually normalize in 5-7 days . Long-term BP goal normotensive  Hyperlipidemia  Lipid lowering medication PTA: Pravachol 40 mg daily  LDL 150, goal < 70  Current lipid lowering medication: Now on Lipitor 80 mg daily  Continue statin at discharge  Diabetes  HgbA1c 7.0, goal < 7.0  Uncontrolled  Other Stroke Risk Factors  Advanced age  Former cigarette smoker - quit  ETOH use, advised to drink no more than 1 alcoholic beverage per day.  Obesity, There is no height or weight on file to calculate BMI., recommend weight loss, diet and exercise as appropriate   Hx stroke/TIA by imaging  Coronary artery disease  Obstructive sleep apnea   Other Active Problems  Chronic kidney disease  Moderate to severe left internal carotid artery stenosis by MRA  -carotid Dopplers pending.  Aspirin 81 mg daily and Plavix 75 mg daily for 3 weeks then Plavix alone.  Consider vascular surgery consult for carotid stenosis.   Hospital day # 0  Mikey Bussing PA-C [facility] Pager [phone] 10/14/2017, 2:40 PM I have personally  examined this patient, reviewed notes, independently viewed imaging studies, participated in medical decision making and plan of care.ROS completed by me personally and pertinent positives fully documented  I have made any additions or clarifications directly to the above note. Agree with note above.He presented with RUE weakness and ataxia due to left MCA branch infarct probably from symptomatic LICA stenosis.MRA apears to be of poor quality but suggests proximal left carotid stenosis. Check carotid dopplers and if LICA stenosis is confirmed may need vascular surgery referral.D/w patient and Dr Candiss Norse. I spent 35   minutes in total face-to-face time with the patient, more than 50% of which was spent in counseling and coordination of care, reviewing test results, reviewing medication and discussing or reviewing the diagnosis of embolic stroke, carotid stenosis   , the prognosis and treatment options.    Antony Contras, MD Medical Director [facility] Pager: [phone] 10/14/2017 2:56 PM   To contact Stroke Continuity provider, please refer to [email or web address] After hours, contact General Neurology

## 2017-10-14 NOTE — Evaluation (Signed)
Physical Therapy Evaluation Patient Details Name: Marco Acevedo MRN: [id number] DOB: Nov 16, 1934 Today's Date: 10/14/2017   History of Present Illness  Pt is a 83 y.o. M with significant PMH of hypertension, hyperlipidemia, GERD, diabetes, COPD not on home oxygen, chronic kidney disease stage [name], chronic diastolic heart failure, CAD, BPH, who presents with right sided weakness. MRI shows small acute left MCA territory infarcts and small chronic left MCA infarcts. NIHSS: 3.  Clinical Impression  Pt admitted with above diagnosis. Pt currently with functional limitations due to the deficits listed below (see PT Problem List). Patient is independent at baseline and enjoys golfing. Currently presenting with decreased functional mobility secondary to decreased right hand coordination and balance deficits. Ambulating 400 feet with no assistive device and negotiating 3 steps with supervision. Scoring 19/24 on the Dynamic Gait Index, indicating patient does have balance deficits but is not at high risk for falls. Highly recommending neuro outpatient PT to maximize patient's functional mobility and suspect patient will progress well based on high activity level prior to stroke and motivation. Pt will benefit from skilled PT to increase their independence and safety with mobility to allow discharge to the venue listed below.       Follow Up Recommendations Outpatient PT (neuro)    Equipment Recommendations  None recommended by PT    Recommendations for Other Services       Precautions / Restrictions Precautions Precautions: Fall Restrictions Weight Bearing Restrictions: No      Mobility  Bed Mobility               General bed mobility comments: OOB in chair  Transfers Overall transfer level: Independent                  Ambulation/Gait Ambulation/Gait assistance: Supervision Gait Distance (Feet): 400 Feet Assistive device: None Gait Pattern/deviations: Step-through  pattern;Decreased dorsiflexion - right;Decreased dorsiflexion - left;Wide base of support Gait velocity: 3.11 ft/s Gait velocity interpretation: >2.62 ft/sec, indicative of community ambulatory General Gait Details: Patient utilizing wide BOS with decreased heel strike at initial contact (likely due to history of peripheral neuropathy). Occasionally drifting to left/right  Stairs Stairs: Yes Stairs assistance: Supervision Stair Management: Two rails Number of Stairs: 3 General stair comments: step over step pattern  Wheelchair Mobility    Modified Rankin (Stroke Patients Only) Modified Rankin (Stroke Patients Only) Pre-Morbid Rankin Score: No symptoms Modified Rankin: Moderately severe disability     Balance Overall balance assessment: Needs assistance Sitting-balance support: No upper extremity supported;Feet supported Sitting balance-Leahy Scale: Normal     Standing balance support: No upper extremity supported;During functional activity Standing balance-Leahy Scale: Good                   Standardized Balance Assessment Standardized Balance Assessment : Dynamic Gait Index   Dynamic Gait Index Level Surface: Mild Impairment Change in Gait Speed: Normal Gait with Horizontal Head Turns: Normal Gait with Vertical Head Turns: Normal Gait and Pivot Turn: Mild Impairment Step Over Obstacle: Mild Impairment Step Around Obstacles: Mild Impairment Steps: Mild Impairment Total Score: 19       Pertinent Vitals/Pain Pain Assessment: No/denies pain    Home Living Family/patient expects to be discharged to:: Private residence Living Arrangements: Spouse/significant other Available Help at Discharge: Family(son, daughter-in-law) Type of Home: House Home Access: Level entry     Home Layout: One level Home Equipment: Walker - 2 wheels;Cane - single point;Shower seat - built in Additional Comments: Lives in an accessible condo  Prior Function Level of  Independence: Independent         Comments: Plays golf twice a week and is the caregiver for his wife     Hand Dominance   Dominant Hand: Right    Extremity/Trunk Assessment   Upper Extremity Assessment Upper Extremity Assessment: Defer to OT evaluation;RUE deficits/detail RUE Deficits / Details: Active range of motion WFL RUE Coordination: decreased fine motor    Lower Extremity Assessment Lower Extremity Assessment: RLE deficits/detail;LLE deficits/detail RLE Deficits / Details: 5/5 RLE Sensation: history of peripheral neuropathy LLE Deficits / Details: 5/5 LLE Sensation: history of peripheral neuropathy    Cervical / Trunk Assessment Cervical / Trunk Assessment: Other exceptions Cervical / Trunk Exceptions: Forward head posture  Communication   Communication: No difficulties  Cognition Arousal/Alertness: Awake/alert Behavior During Therapy: WFL for tasks assessed/performed Overall Cognitive Status: Within Functional Limits for tasks assessed                                        General Comments General comments (skin integrity, edema, etc.): Wears glasses    Exercises     Assessment/Plan    PT Assessment Patient needs continued PT services  PT Problem List Decreased strength;Decreased range of motion;Decreased activity tolerance;Decreased mobility;Decreased balance       PT Treatment Interventions Gait training;Stair training;Functional mobility training;Therapeutic activities;Therapeutic exercise;Balance training;Patient/family education    PT Goals (Current goals can be found in the Care Plan section)  Acute Rehab PT Goals Patient Stated Goal: get back to golf PT Goal Formulation: With patient Time For Goal Achievement: 10/28/17 Potential to Achieve Goals: Good    Frequency Min 4X/week   Barriers to discharge        Co-evaluation               AM-PAC PT "6 Clicks" Daily Activity  Outcome Measure Difficulty turning  over in bed (including adjusting bedclothes, sheets and blankets)?: None Difficulty moving from lying on back to sitting on the side of the bed? : None Difficulty sitting down on and standing up from a chair with arms (e.g., wheelchair, bedside commode, etc,.)?: None Help needed moving to and from a bed to chair (including a wheelchair)?: None Help needed walking in hospital room?: A Little Help needed climbing 3-5 steps with a railing? : A Little 6 Click Score: 22    End of Session Equipment Utilized During Treatment: Gait belt Activity Tolerance: Patient tolerated treatment well Patient left: in bed;with bed alarm set Nurse Communication: Mobility status PT Visit Diagnosis: Unsteadiness on feet (R26.81);Difficulty in walking, not elsewhere classified (R26.2)    Time: 4098-1191 PT Time Calculation (min) (ACUTE ONLY): 30 min   Charges:   PT Evaluation $PT Eval Moderate Complexity: 1 Mod PT Treatments $Therapeutic Activity: 8-22 mins   PT G Codes:       Ellamae Sia, PT, DPT Acute Rehabilitation Services  Pager: [place] 10/14/2017, 10:20 AM

## 2017-10-15 ENCOUNTER — Encounter (HOSPITAL_COMMUNITY): Payer: Medicare Other

## 2017-10-15 ENCOUNTER — Inpatient Hospital Stay (HOSPITAL_COMMUNITY): Payer: Medicare Other

## 2017-10-15 ENCOUNTER — Other Ambulatory Visit: Payer: Self-pay | Admitting: *Deleted

## 2017-10-15 DIAGNOSIS — I639 Cerebral infarction, unspecified: Secondary | ICD-10-CM

## 2017-10-15 DIAGNOSIS — I6522 Occlusion and stenosis of left carotid artery: Secondary | ICD-10-CM

## 2017-10-15 LAB — GLUCOSE, CAPILLARY
Glucose-Capillary: 158 mg/dL — ABNORMAL HIGH (ref 70–99)
Glucose-Capillary: 159 mg/dL — ABNORMAL HIGH (ref 70–99)

## 2017-10-15 MED ORDER — ATORVASTATIN CALCIUM 80 MG PO TABS: 80.0000 mg | ORAL_TABLET | Freq: Every day | ORAL | 0 refills | Status: AC

## 2017-10-15 MED ORDER — CLOPIDOGREL BISULFATE 75 MG PO TABS: 75.0000 mg | ORAL_TABLET | Freq: Every day | ORAL | 0 refills | Status: AC

## 2017-10-15 MED ORDER — AMLODIPINE BESYLATE 10 MG PO TABS: 10.0000 mg | ORAL_TABLET | Freq: Every day | ORAL | 11 refills | Status: DC

## 2017-10-15 MED ORDER — CLOPIDOGREL BISULFATE 75 MG PO TABS: 75.0000 mg | ORAL_TABLET | Freq: Every day | ORAL | 0 refills | Status: DC

## 2017-10-15 NOTE — Care Management Note (Signed)
Case Management Note  Patient Details  Name: Marco Acevedo MRN: [id number] Date of Birth: 10/28/1934  Subjective/Objective:        Pt admitted with CVA. He is from home with spouse.     PCP: Dr Harrington Challenger Insurance: medicare         Action/Plan: CM consulted for outpatient therapy. CM met with patient and son. He would like to attend [employer]. Orders in Epic and information on the AVS. Pts son to provide transportation home.   Expected Discharge Date:                  Expected Discharge Plan:  OP Rehab  In-House Referral:     Discharge planning Services  CM Consult  Post Acute Care Choice:    Choice offered to:     DME Arranged:    DME Agency:     HH Arranged:    [place] Agency:     Status of Service:  Completed, signed off  If discussed at [employer] of Stay Meetings, dates discussed:    Additional Comments:  Pollie Friar, RN 10/15/2017, 11:09 AM

## 2017-10-15 NOTE — Progress Notes (Signed)
SLP Cancellation Note  Patient Details Name: Marco Acevedo MRN: [id number] DOB: 1934/10/25   Cancelled treatment:       Reason Eval/Treat Not Completed: Patient at procedure or test/unavailable   Elvina Sidle, M.S., CCC-SLP 10/15/2017, 1:27 PM

## 2017-10-15 NOTE — H&P (View-Only) (Signed)
Hospital Consult    Reason for Consult:  Symptomatic left carotid artery stenosis Requesting Physician:  Candiss Norse MRN #:  [id number]  History of Present Illness: This is a 83 y.o. male [place] and on Saturday morning, he went to comb his hair and he states his arm was floppy and wouldn't do what he wanted it to do.  He went to the ER in [place].  He states that his gross symptoms improved, but he still has fine motor deficit in his hand.  He states he has not had any speech or visual issues.  He did not have any facial droop.  He has never had any symptoms like this in the past.  He has had tremors in the past.    He has a remote hx of tobacco use-quit in 2003.  He has a hx of cardiac stent placement by Dr. Daneen Schick.  He takes a statin and aspirin daily.  Denies any hx of afib.  He does have diabetes and takes oral agents.  He does have CKD 3 and was recently placed on and taken off of Losartan.  Denies any chest pain.  Denies any claudication sx but does have cramping at night.  Denies hx of blood clots.  He plays golf 2-3x per week on a senior league.  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    Past Medical History:  Diagnosis Date  . Anemia    low iron  . BPH (benign prostatic hyperplasia)   . Bruises easily   . CAD (coronary artery disease)    a. per Dr. Thompson Caul note, prior stenting of Cx in 2007 and 2012. b. DES to Cx in 09/2012.  [name] Chronic diastolic CHF (congestive heart failure) ([place])   . CKD (chronic kidney disease), stage [name] ([place])   . Claustrophobia   . Complication of anesthesia    has to have head elevated to keep from strangling on post nasal drip  . COPD (chronic obstructive pulmonary disease) ([place])   . Diabetes mellitus type II    type 2  . Diarrhea 2015   had for a year and a half  . GERD (gastroesophageal reflux disease)   . Gout   . Granuloma annulare   . History of hiatal hernia   . History of kidney stones   . Hyperlipidemia     . Hypertension   . Neuropathy   . Obesity   . OSA (obstructive sleep apnea)   . Right sided weakness   . Wears dentures    top  . Wears glasses     Past Surgical History:  Procedure Laterality Date  . BLEPHAROPLASTY    . CARDIAC CATHETERIZATION  [phone]   X 2 stents  . CHOLECYSTECTOMY  09/23/2014  . CHOLECYSTECTOMY N/A 09/23/2014   Procedure: LAPAROSCOPIC CHOLECYSTECTOMY WITH INTRAOPERATIVE CHOLANGIOGRAM;  Surgeon: Autumn Messing [name], MD;  Location: [facility];  Service: General;  Laterality: N/A;  . COLONOSCOPY    . COLONOSCOPY WITH PROPOFOL N/A 03/22/2016   Procedure: COLONOSCOPY WITH PROPOFOL;  Surgeon: Wilford Corner, MD;  Location: [facility] ENDOSCOPY;  Service: Endoscopy;  Laterality: N/A;  . ESOPHAGOGASTRODUODENOSCOPY (EGD) WITH PROPOFOL N/A 03/22/2016   Procedure: ESOPHAGOGASTRODUODENOSCOPY (EGD) WITH PROPOFOL;  Surgeon: Wilford Corner, MD;  Location: [facility] ENDOSCOPY;  Service: Endoscopy;  Laterality: N/A;  . EYE SURGERY     both cataracts  . KNEE ARTHROSCOPY WITH MEDIAL MENISECTOMY Right 08/19/2013   Procedure: RIGHT KNEE ARTHROSCOPY WITH PARTIAL  MEDIAL MENISECTOMY AND CHONDROPLASTY;  Surgeon: Hessie Dibble, MD;  Location: [place];  Service: Orthopedics;  Laterality: Right;  . LEFT HEART CATH AND CORONARY ANGIOGRAPHY N/A 06/28/2016   Procedure: Left Heart Cath and Coronary Angiography;  Surgeon: Belva Crome, MD;  Location: [place] CV LAB;  Service: Cardiovascular;  Laterality: N/A;  . LEFT HEART CATHETERIZATION WITH CORONARY ANGIOGRAM N/A 10/03/2011   Procedure: LEFT HEART CATHETERIZATION WITH CORONARY ANGIOGRAM;  Surgeon: Sinclair Grooms, MD;  Location: [facility] CATH LAB;  Service: Cardiovascular;  Laterality: N/A;  . LEFT HEART CATHETERIZATION WITH CORONARY ANGIOGRAM N/A 09/12/2012   Procedure: LEFT HEART CATHETERIZATION WITH CORONARY ANGIOGRAM;  Surgeon: Sinclair Grooms, MD;  Location: [facility] CATH LAB;  Service: Cardiovascular;  Laterality: N/A;  . LIPOMA EXCISION      Biospy only; left parotid gland  . none    . PERCUTANEOUS CORONARY STENT INTERVENTION (PCI-S) N/A 09/17/2012   Procedure: PERCUTANEOUS CORONARY STENT INTERVENTION (PCI-S);  Surgeon: Sinclair Grooms, MD;  Location: [facility] CATH LAB;  Service: Cardiovascular;  Laterality: N/A;  . TRIGGER FINGER RELEASE Left 12/21/2015   Procedure: LEFT LONG FINGER TRIGGER RELEASE ;  Surgeon: Milly Jakob, MD;  Location: [place];  Service: Orthopedics;  Laterality: Left;    Allergies  Allergen Reactions  . Cimetidine Other (See Comments)    Anxiety Attacks    Prior to Admission medications   Medication Sig Start Date End Date Taking? Authorizing Provider  albuterol (PROVENTIL HFA) 108 (90 BASE) MCG/ACT inhaler Inhale 2 puffs into the lungs every 6 (six) hours as needed for wheezing or shortness of breath.    Yes [provider]  albuterol (PROVENTIL) (2.5 MG/3ML) 0.083% nebulizer solution Take 2.5 mg by nebulization every 6 (six) hours as needed for wheezing or shortness of breath.    Yes [provider]  allopurinol (ZYLOPRIM) 100 MG tablet Take 100 mg by mouth daily.   Yes [provider]  aspirin EC 81 MG tablet Take 81 mg by mouth daily.   Yes [provider]  docusate sodium (STOOL SOFTENER) 100 MG capsule Take 100 mg by mouth daily.   Yes [provider]  Ferrous Sulfate (IRON) 325 (65 FE) MG TABS Take 1 tablet by mouth daily.   Yes [provider]  finasteride (PROSCAR) 5 MG tablet Take 1 tablet (5 mg total) by mouth daily. 11/29/11  Yes Rigoberto Noel, MD  glipiZIDE (GLUCOTROL) 5 MG tablet Take 1 tablet (5 mg total) by mouth daily before breakfast. 06/30/16  Yes Barrett, Evelene Croon, PA-C  guaifenesin (HUMIBID E) 400 MG TABS tablet Take 400 mg by mouth at bedtime.    Yes [provider]  isosorbide mononitrate (IMDUR) 120 MG 24 hr tablet Take 1 tablet (120 mg total) by mouth daily. 11/20/14  Yes Belva Crome, MD  losartan  (COZAAR) 50 MG tablet Take 50 mg by mouth daily.    Yes [provider]  nitroGLYCERIN (NITROSTAT) 0.4 MG SL tablet Place 0.4 mg under the tongue every 5 (five) minutes as needed for chest pain.   Yes [provider]  pravastatin (PRAVACHOL) 40 MG tablet Take 40 mg by mouth daily.   Yes [provider]  primidone (MYSOLINE) 50 MG tablet Take 50 mg by mouth at bedtime.    Yes [provider]  ranitidine (ZANTAC) 150 MG tablet Take 150 mg by mouth 2 (two) times daily.   Yes [provider]  traMADol (ULTRAM) 50 MG tablet Take 50 mg by mouth at bedtime as needed for moderate pain.   Yes [provider]  vitamin B-12 (CYANOCOBALAMIN) 1000 MCG tablet Take 1,000 mcg by mouth daily.   Yes [provider]    Social History   Socioeconomic History  . Marital status: Married    Spouse name: Not on file  . Number of children: Not on file  . Years of education: Not on file  . Highest education level: Not on file  Occupational History  . Occupation: retired  Scientific laboratory technician  . Financial resource strain: Not on file  . Food insecurity:    Worry: Not on file    Inability: Not on file  . Transportation needs:    Medical: Not on file    Non-medical: Not on file  Tobacco Use  . Smoking status: Former Smoker    Packs/day: 1.00    Years: 64.00    Pack years: 64.00    Types: Cigarettes    Last attempt to quit: 05/29/2011    Years since quitting: 6.3  . Smokeless tobacco: Never Used  Substance and Sexual Activity  . Alcohol use: Yes    Comment: rarely  . Drug use: No  . Sexual activity: Not Currently  Lifestyle  . Physical activity:    Days per week: Not on file    Minutes per session: Not on file  . Stress: Not on file  Relationships  . Social connections:    Talks on phone: Not on file    Gets together: Not on file    Attends religious service: Not on file    Active member of club or organization: Not on file    Attends  meetings of clubs or organizations: Not on file    Relationship status: Not on file  . Intimate partner violence:    Fear of current or ex partner: Not on file    Emotionally abused: Not on file    Physically abused: Not on file    Forced sexual activity: Not on file  Other Topics Concern  . Not on file  Social History Narrative   Denies Caffeine use      Family History  Problem Relation Age of Onset  . Aortic aneurysm Father   . Aneurysm Mother        brain  . Cancer Brother   . COPD Sister   . Other Sister        health hx unknown  . Other Sister        health hx unknown  . Other Brother        health hx unknown    ROS: [x]  Positive   [ ]  Negative   [ ]  All sytems reviewed and are negative  Cardiac: []  chest pain/pressure []  palpitations []  SOB lying flat []  DOE  Vascular: []  pain in legs while walking []  pain in legs at rest []  pain in legs at night []  non-healing ulcers []  hx of DVT []  swelling in legs  Pulmonary: []  productive cough []  asthma/wheezing []  home O2 [x]  COPD  Neurologic: [x]  weakness/clumsiness in right arm []  numbness in []  arms []  legs []  hx of CVA []  mini stroke [] difficulty speaking or slurred speech []  temporary loss of vision in one eye []  dizziness  Hematologic: []  hx of cancer []  bleeding problems []  problems with blood clotting easily  Endocrine:   [x]  diabetes []  thyroid disease  GI []  vomiting blood []   blood in stool  GU: [x]  CKD/renal failure []  HD--[]  M/W/F or []  T/T/S []  burning with urination []  blood in urine [x]  BPH  Psychiatric: []  anxiety []  depression  Musculoskeletal: []  arthritis []  joint pain  Integumentary: []  rashes []  ulcers  Constitutional: []  fever []  chills   Physical Examination  Vitals:   10/14/17 2353 10/15/17 0316  BP: 123/65 133/74  Pulse: 81 73  Resp: 20 20  Temp: 97.8 F (36.6 C) 97.7 F (36.5 C)  SpO2: 90% 92%   Body mass index is 32.54 kg/m.  General:   WDWN in NAD Gait: Not observed HENT: WNL, normocephalic Pulmonary: normal non-labored breathing, without Rales, rhonchi,  wheezing Cardiac: regular, without  Murmurs, rubs or gallops; without carotid bruits Abdomen:  soft, NT/ND, no masses Skin: without rashes Vascular Exam/Pulses:  Right Left  Radial 2+ (normal) 2+ (normal)  Ulnar 1+ (weak) 1+ (weak)  Popliteal Unable to palpate  Unable to palpate   DP 2+ (normal) 2+ (normal)  PT Unable to palpate  Unable to palpate    Extremities: without ischemic changes, without Gangrene , without cellulitis; without open wounds;  Musculoskeletal: no muscle wasting or atrophy  Neurologic: A&O X 3;  No focal weakness or paresthesias are detected; speech is fluent/normal Psychiatric:  The pt has Normal affect.  CBC    Component Value Date/Time   WBC 9.3 10/13/2017 1528   RBC 4.33 10/13/2017 1528   HGB 12.2 (L) 10/13/2017 1539   HCT 36.0 (L) 10/13/2017 1539   PLT 256 10/13/2017 1528   MCV 89.6 10/13/2017 1528   MCH 29.3 10/13/2017 1528   MCHC 32.7 10/13/2017 1528   RDW 14.1 10/13/2017 1528   LYMPHSABS 1.7 10/13/2017 1528   MONOABS 0.8 10/13/2017 1528   EOSABS 0.1 10/13/2017 1528   BASOSABS 0.1 10/13/2017 1528    BMET    Component Value Date/Time   NA 134 (L) 10/13/2017 1539   NA 140 08/23/2016 0818   K 4.9 10/13/2017 1539   CL 102 10/13/2017 1539   CO2 24 10/13/2017 1528   GLUCOSE 160 (H) 10/13/2017 1539   BUN 52 (H) 10/13/2017 1539   BUN 43 (H) 08/23/2016 0818   CREATININE 3.30 (H) 10/13/2017 1539   CALCIUM 8.7 (L) 10/13/2017 1528   GFRNONAA 18 (L) 10/13/2017 1528   GFRAA 20 (L) 10/13/2017 1528    COAGS: Lab Results  Component Value Date   INR 1.00 10/13/2017   INR 1.05 06/27/2016   INR 1.0 12/03/2014     Non-Invasive Vascular Imaging:   Carotid artery duplex has been completed 10/15/17. Right Internal Carotid Artery 1-39%. Left Internal Carotid Artery 40-59%. Vertebral arteries are patent with antegrade  flow.  MRA 10/13/17: IMPRESSION: 1. Small acute left MCA territory infarcts. 2. Small chronic left MCA infarcts and moderate chronic small vessel ischemic white matter disease. 3. Motion degraded head MRA without large vessel occlusion or definite flow limiting proximal stenosis. 4. Limited neck MRA due to motion and noncontrast technique. Moderate to severe left ICA origin stenosis.   Statin:  Yes.   Beta Blocker:  No. Aspirin:  Yes.   ACEI:  No. ARB:  No. CCB use:  No Other antiplatelets/anticoagulants:  No.    ASSESSMENT/PLAN: This is a 83 y.o. male with symptomatic left carotid artery stenosis   -To limit his risk for further stroke given his left carotid artery stenosis, we will plan for left carotid endarterectomy on October 19, 2017.   -Dr. Donnetta Hutching discussed risks/benefits and pt is  eager to proceed.   Leontine Locket, PA-C [facility] [phone]   I have examined the patient, reviewed and agree with above.  Reviewed both MRI and carotid duplex.  Discussed the patient's symptoms with the patient and also his son who is present when he had the left brain event.  He has had some improvement but remains clumsy in his right arm.  MRA suggested significant stenosis in his left internal carotid artery although it was poor quality.  He did undergo duplex.  This certainly shows a at least upper end of the 40 to 59% stenosis with systolic velocities of 188 cm/s.  Not had any other factors which would potentially cause a stroke.  I am quite concerned that this is a symptomatic disease in the 60% range.  I have recommended endarterectomy for reduction of stroke risk.  I explained the procedure to include 1 to 2% risk of stroke with surgery and also the very slight risk of cranial nerve injury.  Patient understands and wished to proceed as soon as possible.  We will schedule this later in the week.  I feel that it is safe for him to be discharged home today with readmission  for surgery later this week.  Curt Jews, MD 10/15/2017 1:42 PM

## 2017-10-15 NOTE — Progress Notes (Signed)
Phone call to patient and informed that surgery will be on 10/19/17 and I will call tomorrow with more details.

## 2017-10-15 NOTE — Consult Note (Addendum)
Hospital Consult    Reason for Consult:  Symptomatic left carotid artery stenosis Requesting Physician:  Candiss Norse MRN #:  [id number]  History of Present Illness: This is a 83 y.o. male [place] and on Saturday morning, he went to comb his hair and he states his arm was floppy and wouldn't do what he wanted it to do.  He went to the ER in [place].  He states that his gross symptoms improved, but he still has fine motor deficit in his hand.  He states he has not had any speech or visual issues.  He did not have any facial droop.  He has never had any symptoms like this in the past.  He has had tremors in the past.    He has a remote hx of tobacco use-quit in 2003.  He has a hx of cardiac stent placement by Dr. Daneen Schick.  He takes a statin and aspirin daily.  Denies any hx of afib.  He does have diabetes and takes oral agents.  He does have CKD 3 and was recently placed on and taken off of Losartan.  Denies any chest pain.  Denies any claudication sx but does have cramping at night.  Denies hx of blood clots.  He plays golf 2-3x per week on a senior league.  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    Past Medical History:  Diagnosis Date  . Anemia    low iron  . BPH (benign prostatic hyperplasia)   . Bruises easily   . CAD (coronary artery disease)    a. per Dr. Thompson Caul note, prior stenting of Cx in 2007 and 2012. b. DES to Cx in 09/2012.  [name] Chronic diastolic CHF (congestive heart failure) ([place])   . CKD (chronic kidney disease), stage [name] ([place])   . Claustrophobia   . Complication of anesthesia    has to have head elevated to keep from strangling on post nasal drip  . COPD (chronic obstructive pulmonary disease) ([place])   . Diabetes mellitus type II    type 2  . Diarrhea 2015   had for a year and a half  . GERD (gastroesophageal reflux disease)   . Gout   . Granuloma annulare   . History of hiatal hernia   . History of kidney stones   . Hyperlipidemia     . Hypertension   . Neuropathy   . Obesity   . OSA (obstructive sleep apnea)   . Right sided weakness   . Wears dentures    top  . Wears glasses     Past Surgical History:  Procedure Laterality Date  . BLEPHAROPLASTY    . CARDIAC CATHETERIZATION  [phone]   X 2 stents  . CHOLECYSTECTOMY  09/23/2014  . CHOLECYSTECTOMY N/A 09/23/2014   Procedure: LAPAROSCOPIC CHOLECYSTECTOMY WITH INTRAOPERATIVE CHOLANGIOGRAM;  Surgeon: Autumn Messing [name], MD;  Location: [place];  Service: General;  Laterality: N/A;  . COLONOSCOPY    . COLONOSCOPY WITH PROPOFOL N/A 03/22/2016   Procedure: COLONOSCOPY WITH PROPOFOL;  Surgeon: Wilford Corner, MD;  Location: [facility] ENDOSCOPY;  Service: Endoscopy;  Laterality: N/A;  . ESOPHAGOGASTRODUODENOSCOPY (EGD) WITH PROPOFOL N/A 03/22/2016   Procedure: ESOPHAGOGASTRODUODENOSCOPY (EGD) WITH PROPOFOL;  Surgeon: Wilford Corner, MD;  Location: [facility] ENDOSCOPY;  Service: Endoscopy;  Laterality: N/A;  . EYE SURGERY     both cataracts  . KNEE ARTHROSCOPY WITH MEDIAL MENISECTOMY Right 08/19/2013   Procedure: RIGHT KNEE ARTHROSCOPY WITH PARTIAL  MEDIAL MENISECTOMY AND CHONDROPLASTY;  Surgeon: Hessie Dibble, MD;  Location: [place];  Service: Orthopedics;  Laterality: Right;  . LEFT HEART CATH AND CORONARY ANGIOGRAPHY N/A 06/28/2016   Procedure: Left Heart Cath and Coronary Angiography;  Surgeon: Belva Crome, MD;  Location: [place] CV LAB;  Service: Cardiovascular;  Laterality: N/A;  . LEFT HEART CATHETERIZATION WITH CORONARY ANGIOGRAM N/A 10/03/2011   Procedure: LEFT HEART CATHETERIZATION WITH CORONARY ANGIOGRAM;  Surgeon: Sinclair Grooms, MD;  Location: [facility] CATH LAB;  Service: Cardiovascular;  Laterality: N/A;  . LEFT HEART CATHETERIZATION WITH CORONARY ANGIOGRAM N/A 09/12/2012   Procedure: LEFT HEART CATHETERIZATION WITH CORONARY ANGIOGRAM;  Surgeon: Sinclair Grooms, MD;  Location: [facility] CATH LAB;  Service: Cardiovascular;  Laterality: N/A;  . LIPOMA EXCISION      Biospy only; left parotid gland  . none    . PERCUTANEOUS CORONARY STENT INTERVENTION (PCI-S) N/A 09/17/2012   Procedure: PERCUTANEOUS CORONARY STENT INTERVENTION (PCI-S);  Surgeon: Sinclair Grooms, MD;  Location: [facility] CATH LAB;  Service: Cardiovascular;  Laterality: N/A;  . TRIGGER FINGER RELEASE Left 12/21/2015   Procedure: LEFT LONG FINGER TRIGGER RELEASE ;  Surgeon: Milly Jakob, MD;  Location: [place];  Service: Orthopedics;  Laterality: Left;    Allergies  Allergen Reactions  . Cimetidine Other (See Comments)    Anxiety Attacks    Prior to Admission medications   Medication Sig Start Date End Date Taking? Authorizing Provider  albuterol (PROVENTIL HFA) 108 (90 BASE) MCG/ACT inhaler Inhale 2 puffs into the lungs every 6 (six) hours as needed for wheezing or shortness of breath.    Yes [provider]  albuterol (PROVENTIL) (2.5 MG/3ML) 0.083% nebulizer solution Take 2.5 mg by nebulization every 6 (six) hours as needed for wheezing or shortness of breath.    Yes [provider]  allopurinol (ZYLOPRIM) 100 MG tablet Take 100 mg by mouth daily.   Yes [provider]  aspirin EC 81 MG tablet Take 81 mg by mouth daily.   Yes [provider]  docusate sodium (STOOL SOFTENER) 100 MG capsule Take 100 mg by mouth daily.   Yes [provider]  Ferrous Sulfate (IRON) 325 (65 FE) MG TABS Take 1 tablet by mouth daily.   Yes [provider]  finasteride (PROSCAR) 5 MG tablet Take 1 tablet (5 mg total) by mouth daily. 11/29/11  Yes Rigoberto Noel, MD  glipiZIDE (GLUCOTROL) 5 MG tablet Take 1 tablet (5 mg total) by mouth daily before breakfast. 06/30/16  Yes Barrett, Evelene Croon, PA-C  guaifenesin (HUMIBID E) 400 MG TABS tablet Take 400 mg by mouth at bedtime.    Yes [provider]  isosorbide mononitrate (IMDUR) 120 MG 24 hr tablet Take 1 tablet (120 mg total) by mouth daily. 11/20/14  Yes Belva Crome, MD  losartan  (COZAAR) 50 MG tablet Take 50 mg by mouth daily.    Yes [provider]  nitroGLYCERIN (NITROSTAT) 0.4 MG SL tablet Place 0.4 mg under the tongue every 5 (five) minutes as needed for chest pain.   Yes [provider]  pravastatin (PRAVACHOL) 40 MG tablet Take 40 mg by mouth daily.   Yes [provider]  primidone (MYSOLINE) 50 MG tablet Take 50 mg by mouth at bedtime.    Yes [provider]  ranitidine (ZANTAC) 150 MG tablet Take 150 mg by mouth 2 (two) times daily.   Yes [provider]  traMADol (ULTRAM) 50 MG tablet Take 50 mg by mouth at bedtime as needed for moderate pain.   Yes [provider]  vitamin B-12 (CYANOCOBALAMIN) 1000 MCG tablet Take 1,000 mcg by mouth daily.   Yes [provider]    Social History   Socioeconomic History  . Marital status: Married    Spouse name: Not on file  . Number of children: Not on file  . Years of education: Not on file  . Highest education level: Not on file  Occupational History  . Occupation: retired  Scientific laboratory technician  . Financial resource strain: Not on file  . Food insecurity:    Worry: Not on file    Inability: Not on file  . Transportation needs:    Medical: Not on file    Non-medical: Not on file  Tobacco Use  . Smoking status: Former Smoker    Packs/day: 1.00    Years: 64.00    Pack years: 64.00    Types: Cigarettes    Last attempt to quit: 05/29/2011    Years since quitting: 6.3  . Smokeless tobacco: Never Used  Substance and Sexual Activity  . Alcohol use: Yes    Comment: rarely  . Drug use: No  . Sexual activity: Not Currently  Lifestyle  . Physical activity:    Days per week: Not on file    Minutes per session: Not on file  . Stress: Not on file  Relationships  . Social connections:    Talks on phone: Not on file    Gets together: Not on file    Attends religious service: Not on file    Active member of club or organization: Not on file    Attends  meetings of clubs or organizations: Not on file    Relationship status: Not on file  . Intimate partner violence:    Fear of current or ex partner: Not on file    Emotionally abused: Not on file    Physically abused: Not on file    Forced sexual activity: Not on file  Other Topics Concern  . Not on file  Social History Narrative   Denies Caffeine use      Family History  Problem Relation Age of Onset  . Aortic aneurysm Father   . Aneurysm Mother        brain  . Cancer Brother   . COPD Sister   . Other Sister        health hx unknown  . Other Sister        health hx unknown  . Other Brother        health hx unknown    ROS: [x]  Positive   [ ]  Negative   [ ]  All sytems reviewed and are negative  Cardiac: []  chest pain/pressure []  palpitations []  SOB lying flat []  DOE  Vascular: []  pain in legs while walking []  pain in legs at rest []  pain in legs at night []  non-healing ulcers []  hx of DVT []  swelling in legs  Pulmonary: []  productive cough []  asthma/wheezing []  home O2 [x]  COPD  Neurologic: [x]  weakness/clumsiness in right arm []  numbness in []  arms []  legs []  hx of CVA []  mini stroke [] difficulty speaking or slurred speech []  temporary loss of vision in one eye []  dizziness  Hematologic: []  hx of cancer []  bleeding problems []  problems with blood clotting easily  Endocrine:   [x]  diabetes []  thyroid disease  GI []  vomiting blood []   blood in stool  GU: [x]  CKD/renal failure []  HD--[]  M/W/F or []  T/T/S []  burning with urination []  blood in urine [x]  BPH  Psychiatric: []  anxiety []  depression  Musculoskeletal: []  arthritis []  joint pain  Integumentary: []  rashes []  ulcers  Constitutional: []  fever []  chills   Physical Examination  Vitals:   10/14/17 2353 10/15/17 0316  BP: 123/65 133/74  Pulse: 81 73  Resp: 20 20  Temp: 97.8 F (36.6 C) 97.7 F (36.5 C)  SpO2: 90% 92%   Body mass index is 32.54 kg/m.  General:   WDWN in NAD Gait: Not observed HENT: WNL, normocephalic Pulmonary: normal non-labored breathing, without Rales, rhonchi,  wheezing Cardiac: regular, without  Murmurs, rubs or gallops; without carotid bruits Abdomen:  soft, NT/ND, no masses Skin: without rashes Vascular Exam/Pulses:  Right Left  Radial 2+ (normal) 2+ (normal)  Ulnar 1+ (weak) 1+ (weak)  Popliteal Unable to palpate  Unable to palpate   DP 2+ (normal) 2+ (normal)  PT Unable to palpate  Unable to palpate    Extremities: without ischemic changes, without Gangrene , without cellulitis; without open wounds;  Musculoskeletal: no muscle wasting or atrophy  Neurologic: A&O X 3;  No focal weakness or paresthesias are detected; speech is fluent/normal Psychiatric:  The pt has Normal affect.  CBC    Component Value Date/Time   WBC 9.3 10/13/2017 1528   RBC 4.33 10/13/2017 1528   HGB 12.2 (L) 10/13/2017 1539   HCT 36.0 (L) 10/13/2017 1539   PLT 256 10/13/2017 1528   MCV 89.6 10/13/2017 1528   MCH 29.3 10/13/2017 1528   MCHC 32.7 10/13/2017 1528   RDW 14.1 10/13/2017 1528   LYMPHSABS 1.7 10/13/2017 1528   MONOABS 0.8 10/13/2017 1528   EOSABS 0.1 10/13/2017 1528   BASOSABS 0.1 10/13/2017 1528    BMET    Component Value Date/Time   NA 134 (L) 10/13/2017 1539   NA 140 08/23/2016 0818   K 4.9 10/13/2017 1539   CL 102 10/13/2017 1539   CO2 24 10/13/2017 1528   GLUCOSE 160 (H) 10/13/2017 1539   BUN 52 (H) 10/13/2017 1539   BUN 43 (H) 08/23/2016 0818   CREATININE 3.30 (H) 10/13/2017 1539   CALCIUM 8.7 (L) 10/13/2017 1528   GFRNONAA 18 (L) 10/13/2017 1528   GFRAA 20 (L) 10/13/2017 1528    COAGS: Lab Results  Component Value Date   INR 1.00 10/13/2017   INR 1.05 06/27/2016   INR 1.0 12/03/2014     Non-Invasive Vascular Imaging:   Carotid artery duplex has been completed 10/15/17. Right Internal Carotid Artery 1-39%. Left Internal Carotid Artery 40-59%. Vertebral arteries are patent with antegrade  flow.  MRA 10/13/17: IMPRESSION: 1. Small acute left MCA territory infarcts. 2. Small chronic left MCA infarcts and moderate chronic small vessel ischemic white matter disease. 3. Motion degraded head MRA without large vessel occlusion or definite flow limiting proximal stenosis. 4. Limited neck MRA due to motion and noncontrast technique. Moderate to severe left ICA origin stenosis.   Statin:  Yes.   Beta Blocker:  No. Aspirin:  Yes.   ACEI:  No. ARB:  No. CCB use:  No Other antiplatelets/anticoagulants:  No.    ASSESSMENT/PLAN: This is a 83 y.o. male with symptomatic left carotid artery stenosis   -To limit his risk for further stroke given his left carotid artery stenosis, we will plan for left carotid endarterectomy on October 19, 2017.   -Dr. Donnetta Hutching discussed risks/benefits and pt is  eager to proceed.   Leontine Locket, PA-C [facility] [phone]   I have examined the patient, reviewed and agree with above.  Reviewed both MRI and carotid duplex.  Discussed the patient's symptoms with the patient and also his son who is present when he had the left brain event.  He has had some improvement but remains clumsy in his right arm.  MRA suggested significant stenosis in his left internal carotid artery although it was poor quality.  He did undergo duplex.  This certainly shows a at least upper end of the 40 to 59% stenosis with systolic velocities of 670 cm/s.  Not had any other factors which would potentially cause a stroke.  I am quite concerned that this is a symptomatic disease in the 60% range.  I have recommended endarterectomy for reduction of stroke risk.  I explained the procedure to include 1 to 2% risk of stroke with surgery and also the very slight risk of cranial nerve injury.  Patient understands and wished to proceed as soon as possible.  We will schedule this later in the week.  I feel that it is safe for him to be discharged home today with readmission  for surgery later this week.  Curt Jews, MD 10/15/2017 1:42 PM

## 2017-10-15 NOTE — Discharge Summary (Signed)
Marco Acevedo:[id number] DOB: [birth year] DOA: 10/13/2017  PCP: Lawerance Cruel, MD  Admit date: 10/13/2017  Discharge date: 10/15/2017  Admitted From: Home   Disposition:  Home   Recommendations for Outpatient Follow-up:   Follow up with PCP in 1-2 weeks  PCP Please obtain BMP/CBC, 2 view CXR in 1week,  (see Discharge instructions)   PCP Please follow up on the following pending results: Monitor renal function, glycemic control and lipid panel   Home Health: Outpatient PT Equipment/Devices: None Consultations: Neurology, vascular surgery Discharge Condition: Stable CODE STATUS: Full code Diet Recommendation: Heart Healthy   Chief complaint.  Right-sided weakness.  Brief history of present illness from the day of admission and additional interim summary     83 y.o.maleWith PMH significant for HTN, HLD, DM, CAD post 2 stents 2012, CKD stage V follows with [facility] last outpatient creatinine 2.8, who [facility] EDwithright sided weakness.                                                                    Hospital Course    1.  Left MCA stroke with right-sided weakness.  Much improved.  Seen by neurology full stroke work-up done, echocardiogram unremarkable, left carotid ultrasound and MRA do suggest mild to moderate stenosis on the left internal carotid artery, he was also seen by vascular surgery.  Currently on dual antiplatelet therapy for 3 weeks thereafter Plavix only, placed on high intensity Lipitor for better LDL control as it was above goal of 70, A1c was 7.  He will get outpatient PT and will be discharged with outpatient PT.  He may require outpatient carotid artery surgery.  2.  At baseline creatinine around 2.8.  Mild acute renal failure on top, no diet exposure, likely  hemodynamically mediated, discontinue ARB for now, placed on Norvasc instead from tomorrow.  Requested to follow with PCP for lab work within 3 days and also primary nephrology within a week.  Good urine output.  3.  Dyslipidemia.  Now placed on Lipitor 80 mg.  4.  BPH.  Continue alpha-blocker.  5.  CAD.  No acute issues.  For now on dual antiplatelet therapy along with statin for secondary prevention.  6.  DM type II.  A1c 7.  On glipizide at home, with worsening renal function requested to check CBGs QA CHS and monitor with PCP in 3 days for follow-up, may need to adjust his oral hypoglycemics based on his glycemic control and renal function.   Discharge diagnosis     Principal Problem:   Right sided weakness Active Problems:   COPD (chronic obstructive pulmonary disease) (HCC)   OSA (obstructive sleep apnea)   Coronary artery disease involving native coronary artery of native heart with angina pectoris ([place])  CKD (chronic kidney disease) stage 3, GFR 30-59 ml/min [facility])   Essential hypertension    Discharge instructions    Discharge Instructions    Ambulatory referral to Neurology   Complete by:  As directed    Follow up with stroke clinic NP (Jessica Vanschaick or Cecille Rubin, if both not available, consider Dr. Antony Contras, Dr. Bess Harvest, or Dr. Sarina Ill) at [facility] in about 4 weeks.   Ambulatory referral to Occupational Therapy   Complete by:  As directed    Ambulatory referral to Physical Therapy   Complete by:  As directed    Discharge instructions   Complete by:  As directed    Take aspirin and Plavix for 3 weeks thereafter Plavix only.  Stop aspirin after 3 weeks.    Follow with Primary MD Lawerance Cruel, MD in 3 days   Get CBC, CMP,  checked  by Primary MD or SNF MD in 3 days    Activity: As tolerated with Full fall precautions use walker/cane & assistance as needed  Disposition Home    Diet:   Low Carb - Heart Healthy     Accuchecks 4 times/day, Once in AM empty stomach and then before each meal. Log in all results and show them to your Prim.MD in 3 days. If any glucose reading is under 80 or above 300 call your Prim MD immidiately. Follow Low glucose instructions for glucose under 80 as instructed.  Special Instructions: If you have smoked or chewed Tobacco  in the last 2 yrs please stop smoking, stop any regular Alcohol  and or any Recreational drug use.  On your next visit with your primary care physician please Get Medicines reviewed and adjusted.  Please request your Prim.MD to go over all Hospital Tests and Procedure/Radiological results at the follow up, please get all Hospital records sent to your Prim MD by signing hospital release before you go home.  If you experience worsening of your admission symptoms, develop shortness of breath, life threatening emergency, suicidal or homicidal thoughts you must seek medical attention immediately by calling 911 or calling your MD immediately  if symptoms less severe.  You Must read complete instructions/literature along with all the possible adverse reactions/side effects for all the Medicines you take and that have been prescribed to you. Take any new Medicines after you have completely understood and accpet all the possible adverse reactions/side effects.   Do not drive, operate heavy machinery, perform activities at heights, swimming or participation in water activities or provide baby sitting services if your were admitted for syncope or siezures until you have seen by Primary MD or a Neurologist and advised to do so again.   Increase activity slowly   Complete by:  As directed       Discharge Medications   Allergies as of 10/15/2017      Reactions   Cimetidine Other (See Comments)   Anxiety Attacks      Medication List    STOP taking these medications   losartan 50 MG tablet Commonly known as:  COZAAR   pravastatin 40 MG tablet Commonly  known as:  PRAVACHOL     TAKE these medications   allopurinol 100 MG tablet Commonly known as:  ZYLOPRIM Take 100 mg by mouth daily.   amLODipine 10 MG tablet Commonly known as:  NORVASC Take 1 tablet (10 mg total) by mouth daily.   aspirin EC 81 MG tablet Take 81 mg by mouth daily.  atorvastatin 80 MG tablet Commonly known as:  LIPITOR Take 1 tablet (80 mg total) by mouth daily at 6 PM.   clopidogrel 75 MG tablet Commonly known as:  PLAVIX Take 1 tablet (75 mg total) by mouth daily. Start taking on:  10/16/2017   finasteride 5 MG tablet Commonly known as:  PROSCAR Take 1 tablet (5 mg total) by mouth daily.   glipiZIDE 5 MG tablet Commonly known as:  GLUCOTROL Take 1 tablet (5 mg total) by mouth daily before breakfast.   guaifenesin 400 MG Tabs tablet Commonly known as:  HUMIBID E Take 400 mg by mouth at bedtime.   Iron 325 (65 Fe) MG Tabs Take 1 tablet by mouth daily.   isosorbide mononitrate 120 MG 24 hr tablet Commonly known as:  IMDUR Take 1 tablet (120 mg total) by mouth daily.   nitroGLYCERIN 0.4 MG SL tablet Commonly known as:  NITROSTAT Place 0.4 mg under the tongue every 5 (five) minutes as needed for chest pain.   primidone 50 MG tablet Commonly known as:  MYSOLINE Take 50 mg by mouth at bedtime.   PROVENTIL HFA 108 (90 Base) MCG/ACT inhaler Generic drug:  albuterol Inhale 2 puffs into the lungs every 6 (six) hours as needed for wheezing or shortness of breath.   albuterol (2.5 MG/3ML) 0.083% nebulizer solution Commonly known as:  PROVENTIL Take 2.5 mg by nebulization every 6 (six) hours as needed for wheezing or shortness of breath.   ranitidine 150 MG tablet Commonly known as:  ZANTAC Take 150 mg by mouth 2 (two) times daily.   STOOL SOFTENER 100 MG capsule Generic drug:  docusate sodium Take 100 mg by mouth daily.   traMADol 50 MG tablet Commonly known as:  ULTRAM Take 50 mg by mouth at bedtime as needed for moderate pain.   vitamin  B-12 1000 MCG tablet Commonly known as:  CYANOCOBALAMIN Take 1,000 mcg by mouth daily.       Follow-up Information    [facility] Follow up in 4 week(s).   Specialty:  Neurology Why:  stroke clinic. office will call with appt date and time Contact information: [place] Follow up.   Specialty:  Rehabilitation Why:  They will contact you for the first appointment Contact information: [address] [place]       Lawerance Cruel, MD. Schedule an appointment as soon as possible for a visit in 3 day(s).   Specialty:  Family Medicine Why:  Also follow with your nephrologist at [facility] within a week Contact information: [address] [place] [address] [phone]        [name], MD. Schedule an appointment as soon as possible for a visit in 1 week(s).   Specialties:  Vascular Surgery, Cardiology Why:  CVA with carotid artery disease Contact information: [address] [place] [address] [phone]           Major procedures and Radiology Reports - PLEASE review detailed and final reports thoroughly  -     TTE - Left ventricle: The cavity size was normal. There was mild concentric hypertrophy. Systolic function was normal. The estimated ejection fraction was in the range of 55% to 60%. Wall motion was normal; there were no regional wall motion abnormalities. Doppler parameters are consistent with abnormal  left ventricular relaxation (grade 1  diastolic dysfunction).  Doppler parameters are consistent with elevated ventricular end-diastolic filling pressure. - Aortic valve: There was mild stenosis. There was no regurgitation. - Left atrium: The atrium was mildly dilated. - Right ventricle: The cavity size was normal. Wall thickness was  normal. Systolic function was normal. - Right atrium: The atrium was normal in size. - Tricuspid valve: There was no regurgitation. - Pulmonary arteries: Systolic pressure was within the normal range. - Inferior vena cava: The vessel was normal in size. - Pericardium, extracardiac: A trivial pericardial effusion was identified.  Impressions:  - No cardiac source of emboli was identified    Carotid US - Right Internal Carotid Artery 1-39%. Left Internal Carotid Artery 40-59%. Vertebral arteries are patent with antegrade flow.     Dg Chest 2 View  Result Date: 10/13/2017 CLINICAL DATA:  Right arm and  leg weakness. EXAM: CHEST - 2 VIEW COMPARISON:  06/27/2016 FINDINGS: AP and lateral views were obtained. The lungs are clear without focal pneumonia, edema, pneumothorax or pleural effusion. Cardiopericardial silhouette is at upper limits of normal for size. The visualized bony structures of the thorax are intact. Telemetry leads overlie the chest. IMPRESSION: No active cardiopulmonary disease. Electronically Signed   By: Misty Stanley M.D.   On: 10/13/2017 17:13   [name] Neck Wo Contrast  Result Date: 10/13/2017 CLINICAL DATA:  Right-sided weakness. EXAM: MRI HEAD WITHOUT CONTRAST MRA HEAD WITHOUT CONTRAST MRA NECK WITHOUT CONTRAST TECHNIQUE: Multiplanar, multiecho pulse sequences of the brain and surrounding structures were obtained without intravenous contrast. Angiographic images of the Circle of Willis were obtained using MRA technique without intravenous contrast. Angiographic images of the neck were obtained using MRA technique without intravenous contrast. Carotid stenosis measurements (when applicable) are obtained utilizing NASCET criteria, using the distal internal carotid diameter as the denominator. COMPARISON:  Soft tissue neck CT 07/11/2014. FINDINGS: MRI HEAD FINDINGS Brain: There are several small foci of restricted diffusion consistent with acute infarcts in the left MCA territory  involving the left frontal and parietal lobes. The largest infarct measures 10 mm in the posterior left centrum semiovale. Smaller infarcts are present in the left corona radiata and left parietal cortex. There is a chronic microhemorrhage in the right temporal lobe. Small chronic cortical infarcts are noted in the posterior left temporal, left parietal, and left frontal lobes. Patchy T2 hyperintensities in the cerebral white matter bilaterally are nonspecific but compatible with moderate chronic small vessel ischemic disease. There is mild generalized cerebral atrophy. No mass, midline shift, or extra-axial fluid collection is identified. Vascular: Major intracranial vascular flow voids are preserved. Skull and upper cervical spine: Unremarkable bone marrow signal. Sinuses/Orbits: Bilateral cataract extraction. Paranasal sinuses and mastoid air cells are clear. Other: None. MRA HEAD FINDINGS The study is mildly motion degraded. The visualized distal vertebral arteries are widely patent to the basilar. Patent PICA and SCA origins are visualized bilaterally. The basilar artery is widely patent. Posterior communicating arteries are not identified. PCAs are patent without evidence of P1 stenosis. P2 evaluation is mildly limited by artifact, more so on the left. The internal carotid arteries are widely patent from skull base to carotid termini. ACAs and MCAs are patent without evidence of proximal branch occlusion or significant A1 or M1 stenosis. There is an apparent fenestration involving the proximal left M1 segment. Proximal A2 and M2 evaluation is limited by motion artifact. No aneurysm is identified. MRA NECK FINDINGS Image quality is degraded by motion artifact and noncontrast technique. The common carotid and vertebral  artery origins are not evaluated. The distal right common carotid artery and right cervical internal carotid artery are patent without evidence of significant stenosis. Left-sided carotid artery  assessment is limited by motion artifact, however there is narrowing at the carotid bifurcation with suggestion of a moderate to severe stenosis of the left ICA origin. Extensive atherosclerotic plaque resulted in significant ICA origin stenosis on the prior neck CT as well. The more distal left cervical ICA is widely patent. The vertebral arteries are patent and codominant with antegrade flow bilaterally. No definite flow limiting V2 or V3 segment stenosis is identified within limitations of motion. IMPRESSION: 1. Small acute left MCA territory infarcts. 2. Small chronic left MCA infarcts and moderate chronic small vessel ischemic white matter disease. 3. Motion degraded head MRA without large vessel occlusion or definite flow limiting proximal stenosis. 4. Limited neck MRA due to motion and noncontrast technique. Moderate to severe left ICA origin stenosis. Electronically Signed   By: Logan Bores M.D.   On: 10/13/2017 21:07   Mr Brain Wo Contrast  Result Date: 10/13/2017 CLINICAL DATA:  Right-sided weakness. EXAM: MRI HEAD WITHOUT CONTRAST MRA HEAD WITHOUT CONTRAST MRA NECK WITHOUT CONTRAST TECHNIQUE: Multiplanar, multiecho pulse sequences of the brain and surrounding structures were obtained without intravenous contrast. Angiographic images of the Circle of Willis were obtained using MRA technique without intravenous contrast. Angiographic images of the neck were obtained using MRA technique without intravenous contrast. Carotid stenosis measurements (when applicable) are obtained utilizing NASCET criteria, using the distal internal carotid diameter as the denominator. COMPARISON:  Soft tissue neck CT 07/11/2014. FINDINGS: MRI HEAD FINDINGS Brain: There are several small foci of restricted diffusion consistent with acute infarcts in the left MCA territory involving the left frontal and parietal lobes. The largest infarct measures 10 mm in the posterior left centrum semiovale. Smaller infarcts are present in  the left corona radiata and left parietal cortex. There is a chronic microhemorrhage in the right temporal lobe. Small chronic cortical infarcts are noted in the posterior left temporal, left parietal, and left frontal lobes. Patchy T2 hyperintensities in the cerebral white matter bilaterally are nonspecific but compatible with moderate chronic small vessel ischemic disease. There is mild generalized cerebral atrophy. No mass, midline shift, or extra-axial fluid collection is identified. Vascular: Major intracranial vascular flow voids are preserved. Skull and upper cervical spine: Unremarkable bone marrow signal. Sinuses/Orbits: Bilateral cataract extraction. Paranasal sinuses and mastoid air cells are clear. Other: None. MRA HEAD FINDINGS The study is mildly motion degraded. The visualized distal vertebral arteries are widely patent to the basilar. Patent PICA and SCA origins are visualized bilaterally. The basilar artery is widely patent. Posterior communicating arteries are not identified. PCAs are patent without evidence of P1 stenosis. P2 evaluation is mildly limited by artifact, more so on the left. The internal carotid arteries are widely patent from skull base to carotid termini. ACAs and MCAs are patent without evidence of proximal branch occlusion or significant A1 or M1 stenosis. There is an apparent fenestration involving the proximal left M1 segment. Proximal A2 and M2 evaluation is limited by motion artifact. No aneurysm is identified. MRA NECK FINDINGS Image quality is degraded by motion artifact and noncontrast technique. The common carotid and vertebral artery origins are not evaluated. The distal right common carotid artery and right cervical internal carotid artery are patent without evidence of significant stenosis. Left-sided carotid artery assessment is limited by motion artifact, however there is narrowing at the carotid bifurcation with suggestion of a  moderate to severe stenosis of the left  ICA origin. Extensive atherosclerotic plaque resulted in significant ICA origin stenosis on the prior neck CT as well. The more distal left cervical ICA is widely patent. The vertebral arteries are patent and codominant with antegrade flow bilaterally. No definite flow limiting V2 or V3 segment stenosis is identified within limitations of motion. IMPRESSION: 1. Small acute left MCA territory infarcts. 2. Small chronic left MCA infarcts and moderate chronic small vessel ischemic white matter disease. 3. Motion degraded head MRA without large vessel occlusion or definite flow limiting proximal stenosis. 4. Limited neck MRA due to motion and noncontrast technique. Moderate to severe left ICA origin stenosis. Electronically Signed   By: Logan Bores M.D.   On: 10/13/2017 21:07   [name] Head Wo Contrast  Result Date: 10/13/2017 CLINICAL DATA:  Right-sided weakness. EXAM: MRI HEAD WITHOUT CONTRAST MRA HEAD WITHOUT CONTRAST MRA NECK WITHOUT CONTRAST TECHNIQUE: Multiplanar, multiecho pulse sequences of the brain and surrounding structures were obtained without intravenous contrast. Angiographic images of the Circle of Willis were obtained using MRA technique without intravenous contrast. Angiographic images of the neck were obtained using MRA technique without intravenous contrast. Carotid stenosis measurements (when applicable) are obtained utilizing NASCET criteria, using the distal internal carotid diameter as the denominator. COMPARISON:  Soft tissue neck CT 07/11/2014. FINDINGS: MRI HEAD FINDINGS Brain: There are several small foci of restricted diffusion consistent with acute infarcts in the left MCA territory involving the left frontal and parietal lobes. The largest infarct measures 10 mm in the posterior left centrum semiovale. Smaller infarcts are present in the left corona radiata and left parietal cortex. There is a chronic microhemorrhage in the right temporal lobe. Small chronic cortical infarcts are noted  in the posterior left temporal, left parietal, and left frontal lobes. Patchy T2 hyperintensities in the cerebral white matter bilaterally are nonspecific but compatible with moderate chronic small vessel ischemic disease. There is mild generalized cerebral atrophy. No mass, midline shift, or extra-axial fluid collection is identified. Vascular: Major intracranial vascular flow voids are preserved. Skull and upper cervical spine: Unremarkable bone marrow signal. Sinuses/Orbits: Bilateral cataract extraction. Paranasal sinuses and mastoid air cells are clear. Other: None. MRA HEAD FINDINGS The study is mildly motion degraded. The visualized distal vertebral arteries are widely patent to the basilar. Patent PICA and SCA origins are visualized bilaterally. The basilar artery is widely patent. Posterior communicating arteries are not identified. PCAs are patent without evidence of P1 stenosis. P2 evaluation is mildly limited by artifact, more so on the left. The internal carotid arteries are widely patent from skull base to carotid termini. ACAs and MCAs are patent without evidence of proximal branch occlusion or significant A1 or M1 stenosis. There is an apparent fenestration involving the proximal left M1 segment. Proximal A2 and M2 evaluation is limited by motion artifact. No aneurysm is identified. MRA NECK FINDINGS Image quality is degraded by motion artifact and noncontrast technique. The common carotid and vertebral artery origins are not evaluated. The distal right common carotid artery and right cervical internal carotid artery are patent without evidence of significant stenosis. Left-sided carotid artery assessment is limited by motion artifact, however there is narrowing at the carotid bifurcation with suggestion of a moderate to severe stenosis of the left ICA origin. Extensive atherosclerotic plaque resulted in significant ICA origin stenosis on the prior neck CT as well. The more distal left cervical ICA  is widely patent. The vertebral arteries are patent and codominant with antegrade  flow bilaterally. No definite flow limiting V2 or V3 segment stenosis is identified within limitations of motion. IMPRESSION: 1. Small acute left MCA territory infarcts. 2. Small chronic left MCA infarcts and moderate chronic small vessel ischemic white matter disease. 3. Motion degraded head MRA without large vessel occlusion or definite flow limiting proximal stenosis. 4. Limited neck MRA due to motion and noncontrast technique. Moderate to severe left ICA origin stenosis. Electronically Signed   By: Logan Bores M.D.   On: 10/13/2017 21:07    Micro Results     No results found for this or any previous visit (from the past 240 hour(s)).  Today   Subjective    Jermon Chalfant today has no headache,no chest abdominal pain,no new weakness tingling or numbness, feels much better wants to go home today.     Objective   Blood pressure 133/74, pulse 73, temperature 97.7 F (36.5 C), temperature source Oral, resp. rate 20, height 5\' 8"  (1.727 m), weight 97.1 kg (214 lb), SpO2 92 %.   Intake/Output Summary (Last 24 hours) at 10/15/2017 1208 Last data filed at 10/14/2017 1423 Gross per 24 hour  Intake 0 ml  Output -  Net 0 ml    Exam Awake Alert, Oriented x 3, No new F.N deficits, minimal right-sided weakness, normal affect [place].AT,PERRAL Supple Neck,No JVD, No cervical lymphadenopathy appriciated.  Symmetrical Chest wall movement, Good air movement bilaterally, CTAB RRR,No Gallops,Rubs or new Murmurs, No Parasternal Heave +ve B.Sounds, Abd Soft, Non tender, No organomegaly appriciated, No rebound -guarding or rigidity. No Cyanosis, Clubbing or edema, No new Rash or bruise   Data Review   CBC w Diff:  Lab Results  Component Value Date   WBC 9.3 10/13/2017   HGB 12.2 (L) 10/13/2017   HCT 36.0 (L) 10/13/2017   PLT 256 10/13/2017   LYMPHOPCT 19 10/13/2017   MONOPCT 9 10/13/2017   EOSPCT 1 10/13/2017    BASOPCT 1 10/13/2017    CMP:  Lab Results  Component Value Date   NA 134 (L) 10/13/2017   NA 140 08/23/2016   K 4.9 10/13/2017   CL 102 10/13/2017   CO2 24 10/13/2017   BUN 52 (H) 10/13/2017   BUN 43 (H) 08/23/2016   CREATININE 3.30 (H) 10/13/2017   PROT 7.1 06/27/2016   ALBUMIN 3.7 06/27/2016   BILITOT 0.8 06/27/2016   ALKPHOS 77 06/27/2016   AST 24 06/27/2016   ALT 19 06/27/2016  . Lab Results  Component Value Date   HGBA1C 7.0 (H) 10/14/2017   Lab Results  Component Value Date   CHOL 220 (H) 10/14/2017   HDL 39 (L) 10/14/2017   LDLCALC 150 (H) 10/14/2017   TRIG 156 (H) 10/14/2017   CHOLHDL 5.6 10/14/2017     Total Time in preparing paper work, data evaluation and todays exam - 39 minutes  Lala Lund M.D on 10/15/2017 at 12:08 PM  [facility]   Office  [phone]

## 2017-10-15 NOTE — Progress Notes (Signed)
Occupational Therapy Treatment Patient Details Name: Marco Acevedo MRN: [id number] DOB: Feb 12, 1935 Today's Date: 10/15/2017    History of present illness Pt is a 83 y.o. M with significant PMH of hypertension, hyperlipidemia, GERD, diabetes, COPD not on home oxygen, chronic kidney disease stage [name], chronic diastolic heart failure, CAD, BPH, who presents with right sided weakness. MRI shows small acute left MCA territory infarcts and small chronic left MCA infarcts. NIHSS: 3.   OT comments  Pt progressing towards established OT goals. Pt performing grooming at sink and dressing with supervision for safety. Educating pt and family on neuroplasticity and using RUE as much as possible. Pt performing FM exercises demonstrating understanding; continues to present with decreased FM coordination. Answering all pt questions in preparation for dc later today. Continue to recommend follow up with neuro OP OT.    Follow Up Recommendations  Outpatient OT(Neuro-outpatient OT)    Equipment Recommendations  None recommended by OT    Recommendations for Other Services      Precautions / Restrictions Precautions Precautions: Fall Restrictions Weight Bearing Restrictions: No       Mobility Bed Mobility Overal bed mobility: Modified Independent             General bed mobility comments: EOB  Transfers Overall transfer level: Needs assistance   Transfers: Sit to/from Stand Sit to Stand: Supervision         General transfer comment: For safety    Balance Overall balance assessment: Needs assistance Sitting-balance support: No upper extremity supported;Feet supported Sitting balance-Leahy Scale: Normal     Standing balance support: No upper extremity supported;During functional activity Standing balance-Leahy Scale: Good                             ADL either performed or assessed with clinical judgement   ADL Overall ADL's : Needs assistance/impaired      Grooming: Brushing hair;Supervision/safety;Standing Grooming Details (indicate cue type and reason): Pt brushing his hair while standing at the sink. Encouraged use of RUE for neuroplasticity.         Upper Body Dressing : Supervision/safety Upper Body Dressing Details (indicate cue type and reason): Donned shirt Lower Body Dressing: Supervision/safety Lower Body Dressing Details (indicate cue type and reason): donned shorts             Functional mobility during ADLs: Supervision/safety General ADL Comments: Pt performing dressing and brushing his hair. Continues to benefit from supervision for safety     Vision       Perception     Praxis      Cognition Arousal/Alertness: Awake/alert Behavior During Therapy: WFL for tasks assessed/performed Overall Cognitive Status: Within Functional Limits for tasks assessed                                          Exercises Exercises: Other exercises Other Exercises Other Exercises: Reviewed fine motor hand out and exercises. Pt performing each exercise 10 times including finger lifts, finger opposition, thumb circles, and picking up small coins for in hand manipulation   Shoulder Instructions       General Comments Son present throughout session    Pertinent Vitals/ Pain       Pain Assessment: No/denies pain  Home Living  Prior Functioning/Environment              Frequency  Min 1X/week        Progress Toward Goals  OT Goals(current goals can now be found in the care plan section)  Progress towards OT goals: Progressing toward goals  Acute Rehab OT Goals Patient Stated Goal: get back to golf OT Goal Formulation: With patient Time For Goal Achievement: 10/28/17 Potential to Achieve Goals: Good ADL Goals Pt/caregiver will Perform Home Exercise Program: Right Upper extremity;With written HEP provided;Independently(RUE increased  gross and fine motor) Additional ADL Goal #1: Pt will demonstrate improved fine motor coordination to manipulate a variety of fasteners independently during dressing tasks.  Plan Discharge plan remains appropriate    Co-evaluation                 AM-PAC PT "6 Clicks" Daily Activity     Outcome Measure   Help from another person eating meals?: None Help from another person taking care of personal grooming?: A Little Help from another person toileting, which includes using toliet, bedpan, or urinal?: A Little Help from another person bathing (including washing, rinsing, drying)?: A Little Help from another person to put on and taking off regular upper body clothing?: None Help from another person to put on and taking off regular lower body clothing?: A Little 6 Click Score: 20    End of Session    OT Visit Diagnosis: Other abnormalities of gait and mobility (R26.89);Hemiplegia and hemiparesis Hemiplegia - Right/Left: Right Hemiplegia - dominant/non-dominant: Dominant Hemiplegia - caused by: Cerebral infarction   Activity Tolerance Patient tolerated treatment well   Patient Left with family/visitor present(in bathroom)   Nurse Communication Mobility status        Time: 6063-0160 OT Time Calculation (min): 18 min  Charges: OT General Charges $OT Visit: 1 Visit OT Treatments $Self Care/Home Management : 8-22 mins  [place], OTR/L Acute Rehab Pager: [phone] Office: [place] 10/15/2017, 1:22 PM

## 2017-10-15 NOTE — Discharge Instructions (Signed)
Take aspirin and Plavix for 3 weeks thereafter Plavix only.  Stop aspirin after 3 weeks.     Follow with Primary MD Marco Cruel, MD in 3 days   Get CBC, CMP,  checked  by Primary MD or SNF MD in 3 days    Activity: As tolerated with Full fall precautions use walker/cane & assistance as needed  Disposition Home    Diet:   Low Carb - Heart Healthy    Accuchecks 4 times/day, Once in AM empty stomach and then before each meal. Log in all results and show them to your Prim.MD in 3 days. If any glucose reading is under 80 or above 300 call your Prim MD immidiately. Follow Low glucose instructions for glucose under 80 as instructed.  Special Instructions: If you have smoked or chewed Tobacco  in the last 2 yrs please stop smoking, stop any regular Alcohol  and or any Recreational drug use.  On your next visit with your primary care physician please Get Medicines reviewed and adjusted.  Please request your Prim.MD to go over all Hospital Tests and Procedure/Radiological results at the follow up, please get all Hospital records sent to your Prim MD by signing hospital release before you go home.  If you experience worsening of your admission symptoms, develop shortness of breath, life threatening emergency, suicidal or homicidal thoughts you must seek medical attention immediately by calling 911 or calling your MD immediately  if symptoms less severe.  You Must read complete instructions/literature along with all the possible adverse reactions/side effects for all the Medicines you take and that have been prescribed to you. Take any new Medicines after you have completely understood and accpet all the possible adverse reactions/side effects.   Do not drive, operate heavy machinery, perform activities at heights, swimming or participation in water activities or provide baby sitting services if your were admitted for syncope or siezures until you have seen by Primary MD or a Neurologist and  advised to do so again.

## 2017-10-15 NOTE — Progress Notes (Signed)
STROKE TEAM PROGRESS NOTE   HISTORY OF PRESENT ILLNESS (per record) Marco Acevedo is an 83 y.o. male  With PMH significant for HTN, HLD, DM, CAD post 2 stents 2012 who presented to [facility] ED with right sided weakness.   Patient woke up this morning in his usual state of health about 0815. When he went to comb his hair about 15 minutes later he noticed that he didn't have control of his right arm. He described his arm as being floppy, and he was unable to lift the right arm above the level of his shoulder. Patient had tingling in the back of his neck that was present at the time of onset. This numbness is not new. Patient states he has numbness in his neck due to his disk degeneration. denies any numbness, tingling in face or arms. Denies any leg weakness,dizziness, room spinning, HA, visual problems, slurred speech, CP, SOB( outside of his usual shortness of breath), problems walking. Son states that he did notice that his father appeared to be dragging his right leg when he was walking. Patient denies any problems walking. Patient was on vacation and opted to go to [facility]. CT of head at [facility] was negative for any acute findings. ( Son has papers from [facility] with Head CT). They chose to leave AMA and come to [place] which is home for further evaluation. He did receive a baby aspirin at [employer].  He does take a baby aspirin daily.Since leaving [place] patient's son states that his arm strength has gotten better, but it is still not back to normal.   ED course:  CT at [facility] showed no acute abnormality.  MRI Brain: pending, MRA Head and Neck: pending BP on arrival was 104/61, BG: 160, BUN: 52, creatinine: 3.30, NA: 134  No prior history of stroke. Patient has seen a neurologist in 2017 for tremor.   Date last known well: Date: 10/13/2017 Time last known well: Time: 08:15 tPA Given: No: contraindicated, outside of window Modified Rankin: Rankin  Score=1     SUBJECTIVE (INTERVAL HISTORY) The patient's son was at the bedside.  The patient had carotid ultrasound this morning that shows 40-59% left ICA stenosis. Vascular surgery has been consulted and patient will likely outpatient follow-up. Plans are to discharge home later today   OBJECTIVE Temp:  [97.6 F (36.4 C)-98.2 F (36.8 C)] 97.7 F (36.5 C) (07/08 0316) Pulse Rate:  [72-91] 73 (07/08 0316) Cardiac Rhythm: Normal sinus rhythm (07/08 0820) Resp:  [20] 20 (07/08 0316) BP: (123-143)/(65-83) 133/74 (07/08 0316) SpO2:  [90 %-97 %] 92 % (07/08 0316) Weight:  [214 lb (97.1 kg)] 214 lb (97.1 kg) (07/07 1440)  CBC:  Recent Labs  Lab 10/13/17 1528 10/13/17 1539  WBC 9.3  --   NEUTROABS 6.5  --   HGB 12.7* 12.2*  HCT 38.8* 36.0*  MCV 89.6  --   PLT 256  --     Basic Metabolic Panel:  Recent Labs  Lab 10/13/17 1528 10/13/17 1539  NA 134* 134*  K 4.9 4.9  CL 103 102  CO2 24  --   GLUCOSE 162* 160*  BUN 46* 52*  CREATININE 3.08* 3.30*  CALCIUM 8.7*  --     Lipid Panel:     Component Value Date/Time   CHOL 220 (H) 10/14/2017 0742   TRIG 156 (H) 10/14/2017 0742   HDL 39 (L) 10/14/2017 0742   CHOLHDL 5.6 10/14/2017 0742   VLDL 31 10/14/2017 0742  [place] 150 (H) 10/14/2017 0742   HgbA1c:  Lab Results  Component Value Date   HGBA1C 7.0 (H) 10/14/2017   Urine Drug Screen:     Component Value Date/Time   LABOPIA NONE DETECTED 10/13/2017 1711   COCAINSCRNUR NONE DETECTED 10/13/2017 1711   LABBENZ NONE DETECTED 10/13/2017 1711   AMPHETMU NONE DETECTED 10/13/2017 1711   THCU NONE DETECTED 10/13/2017 1711   LABBARB (A) 10/13/2017 1711    Result not available. Reagent lot number recalled by manufacturer.    Alcohol Level     Component Value Date/Time   ETH <10 10/13/2017 1528    IMAGING   Dg Chest 2 View 10/13/2017 IMPRESSION:  No active cardiopulmonary disease.   [name] Head Wo Contrast [name] Neck Wo Contrast 10/13/2017 IMPRESSION:   1. Small acute left MCA territory infarcts.  2. Small chronic left MCA infarcts and moderate chronic small vessel ischemic white matter disease.  3. Motion degraded head MRA without large vessel occlusion or definite flow limiting proximal stenosis.  4. Limited neck MRA due to motion and noncontrast technique. Moderate to severe left ICA origin stenosis.      Transthoracic Echocardiogram - Left ventricle: The cavity size was normal. There was mild   concentric hypertrophy. Systolic function was normal. The   estimated ejection fraction was in the range of 55% to 60%. Wall   motion was normal; there were no regional wall motion   abnormalities.    Bilateral Carotid Dopplers -right ICA 1-39 percent stenosis. Left ICA 40-59 percent stenosis    PHYSICAL EXAM Vitals:   10/14/17 1655 10/14/17 2001 10/14/17 2353 10/15/17 0316  BP: (!) 141/83 (!) 143/74 123/65 133/74  Pulse: 72 91 81 73  Resp: 20 20 20 20   Temp: 98.2 F (36.8 C) 97.6 F (36.4 C) 97.8 F (36.6 C) 97.7 F (36.5 C)  TempSrc: Oral Oral Oral Oral  SpO2: 95% 97% 90% 92%  Weight:      Height:       Pleasant obese middle aged Caucasian male not in distress. . Afebrile. Head is nontraumatic. Neck is supple without bruit.    Cardiac exam no murmur or gallop. Lungs are clear to auscultation. Distal pulses are well felt.  Neurological Exam :  Awake  Alert oriented x 3. Normal speech and language.eye movements full without nystagmus.fundi were not visualized. Vision acuity and fields appear normal. Hearing is normal. Palatal movements are normal. Face symmetric. Tongue midline. Normal strength, tone, reflexes and coordination except mild right grip weakness and  Minimal RUE ataxia. Diminished fine finger movements on right and orbitrs left over right upper extremity. Normal sensation. Gait deferred.     ASSESSMENT/PLAN Marco Acevedo is a 84 y.o. male with history of remote tobacco use, obstructive sleep apnea, obesity,  hypertension, hyperlipidemia, gout, diabetes mellitus, COPD, claustrophobia, previous strokes by imaging, chronic kidney disease, congestive heart failure, and coronary artery disease with previous stents presenting with right-sided weakness. He did not receive IV t-PA due to late presentation.  Strokes:  Small acute left MCA territory infarcts - likely embolic secondary to left internal carotid artery stenosis.  Resultant  Mild RUE weakness and ataxia  CT head - performed at an outside hospital.  The report is not available at this time.  MRI head - Small acute left MCA territory infarcts.   MRA H&N - Moderate to severe left ICA origin stenosis.   Carotid Doppler - pending  2D Echo - pending  LDL - 150  HgbA1c - 7.0  VTE prophylaxis -  - Lovenox Diet Order           Diet heart healthy/carb modified Room service appropriate? Yes; Fluid consistency: Thin  Diet effective now          aspirin 81 mg daily prior to admission, now on aspirin 81 mg daily and clopidogrel 75 mg daily  Patient counseled to be compliant with his antithrombotic medications  Ongoing aggressive stroke risk factor management  Therapy recommendations: Outpatient occupational therapy recommended Disposition:  home Hypertension  Stable . Permissive hypertension (OK if < 220/120) but gradually normalize in 5-7 days . Long-term BP goal normotensive  Hyperlipidemia  Lipid lowering medication PTA: Pravachol 40 mg daily  LDL 150, goal < 70  Current lipid lowering medication: Now on Lipitor 80 mg daily  Continue statin at discharge  Diabetes  HgbA1c 7.0, goal < 7.0  Uncontrolled  Other Stroke Risk Factors  Advanced age  Former cigarette smoker - quit  ETOH use, advised to drink no more than 1 alcoholic beverage per day.  Obesity, Body mass index is 32.54 kg/m., recommend weight loss, diet and exercise as appropriate   Hx stroke/TIA by imaging  Coronary artery disease  Obstructive  sleep apnea   Other Active Problems  Chronic kidney disease  Moderate to severe left internal carotid artery stenosis by MRA  -carotid Dopplers pending.  Aspirin 81 mg daily and Plavix 75 mg daily for 3 weeks then Plavix alone.  Consider vascular surgery consult for carotid stenosis.   Hospital day # 1   He presented with RUE weakness and ataxia due to left MCA branch infarct probably from symptomatic LICA stenosis.MRA apears to be of poor quality but suggests proximal left carotid stenosisbut carotid ultrasound shows only 40-59% stenosis. Agree with vascular surgery consult and likely outpatient conservative follow-up. Patient counseled about aggressive risk factor modification.D /w patient and his son and Dr Candiss Norse. I spent 25   minutes in total face-to-face time with the patient, more than 50% of which was spent in counseling and coordination of care, reviewing test results, reviewing medication and discussing or reviewing the diagnosis of embolic stroke, carotid stenosis   , the prognosis and treatment options.follow-up as an outpatient in the stroke clinic in 6 weeks. Stroke team will sign off.    Antony Contras, MD Medical Director [facility] Pager: [phone] 10/15/2017 1:36 PM   To contact Stroke Continuity provider, please refer to [email or web address] After hours, contact General Neurology

## 2017-10-15 NOTE — Progress Notes (Signed)
*  Preliminary Results*  Carotid artery duplex has been completed. Right Internal Carotid Artery 1-39%. Left Internal Carotid Artery 40-59%. Vertebral arteries are patent with antegrade flow.  10/15/2017 10:20 AM  Abram Sander

## 2017-10-16 ENCOUNTER — Telehealth: Payer: Self-pay | Admitting: *Deleted

## 2017-10-16 NOTE — Consult Note (Signed)
            [facility] CM Primary Care Navigator  10/16/2017  Marco Acevedo Mar 09, 1935 [id number]   Went to see patient at the bedside to identify possible discharge needs but he was already discharged per staff report. Patient went home with Outpatient Rehab yesterday.  Primary care provider's office is listed as providing transition of care (TOC) follow-up.  Patient has discharge instruction to follow-up with primary care provider (Dr. Tamsen Meek) in 3 days; cardiology/ vascular surgery in 1 week, ; neurology(stroke clinic) in 4 weeks.  Noted order for EMMI Stroke calls after discharge already in place.   For additional questions please contact:  Edwena Felty A. Virgin Zellers, BSN, RN-BC [facility] PRIMARY CARE Navigator Cell: [phone]

## 2017-10-16 NOTE — Telephone Encounter (Signed)
Phone call to patient instructed to be at [facility] admitting at 10 am for surgery on 10/19/17. NPO past MN night prior. Follow the detailed instruction received from the hospital pre-admitting department about this surgery. Continue ASA and Plavix per Dr. Donnetta Hutching. Verbalized understanding.

## 2017-10-17 ENCOUNTER — Emergency Department (HOSPITAL_COMMUNITY)
Admission: EM | Admit: 2017-10-17 | Discharge: 2017-10-17 | Disposition: A | Discharge status: Home / Self Care | Payer: Medicare Other | Source: Home / Self Care | Attending: Emergency Medicine | Admitting: Emergency Medicine

## 2017-10-17 ENCOUNTER — Emergency Department (HOSPITAL_COMMUNITY): Payer: Medicare Other

## 2017-10-17 ENCOUNTER — Encounter (HOSPITAL_COMMUNITY): Payer: Self-pay | Admitting: Emergency Medicine

## 2017-10-17 DIAGNOSIS — E1122 Type 2 diabetes mellitus with diabetic chronic kidney disease: Secondary | ICD-10-CM | POA: Diagnosis not present

## 2017-10-17 DIAGNOSIS — R29898 Other symptoms and signs involving the musculoskeletal system: Secondary | ICD-10-CM | POA: Insufficient documentation

## 2017-10-17 DIAGNOSIS — F40240 Claustrophobia: Secondary | ICD-10-CM | POA: Diagnosis not present

## 2017-10-17 DIAGNOSIS — R4781 Slurred speech: Secondary | ICD-10-CM | POA: Diagnosis not present

## 2017-10-17 DIAGNOSIS — R9431 Abnormal electrocardiogram [ECG] [EKG]: Secondary | ICD-10-CM | POA: Diagnosis not present

## 2017-10-17 DIAGNOSIS — I130 Hypertensive heart and chronic kidney disease with heart failure and stage 1 through stage 4 chronic kidney disease, or unspecified chronic kidney disease: Secondary | ICD-10-CM | POA: Insufficient documentation

## 2017-10-17 DIAGNOSIS — R2990 Unspecified symptoms and signs involving the nervous system: Secondary | ICD-10-CM

## 2017-10-17 DIAGNOSIS — I509 Heart failure, unspecified: Secondary | ICD-10-CM | POA: Insufficient documentation

## 2017-10-17 DIAGNOSIS — Z79899 Other long term (current) drug therapy: Secondary | ICD-10-CM | POA: Insufficient documentation

## 2017-10-17 DIAGNOSIS — N183 Chronic kidney disease, stage 3 (moderate): Secondary | ICD-10-CM

## 2017-10-17 DIAGNOSIS — I2510 Atherosclerotic heart disease of native coronary artery without angina pectoris: Secondary | ICD-10-CM | POA: Insufficient documentation

## 2017-10-17 DIAGNOSIS — Z7982 Long term (current) use of aspirin: Secondary | ICD-10-CM | POA: Insufficient documentation

## 2017-10-17 DIAGNOSIS — J449 Chronic obstructive pulmonary disease, unspecified: Secondary | ICD-10-CM | POA: Insufficient documentation

## 2017-10-17 DIAGNOSIS — I6522 Occlusion and stenosis of left carotid artery: Secondary | ICD-10-CM | POA: Diagnosis not present

## 2017-10-17 DIAGNOSIS — Z87891 Personal history of nicotine dependence: Secondary | ICD-10-CM | POA: Insufficient documentation

## 2017-10-17 DIAGNOSIS — I639 Cerebral infarction, unspecified: Secondary | ICD-10-CM | POA: Diagnosis not present

## 2017-10-17 DIAGNOSIS — I63412 Cerebral infarction due to embolism of left middle cerebral artery: Secondary | ICD-10-CM | POA: Diagnosis not present

## 2017-10-17 DIAGNOSIS — I5032 Chronic diastolic (congestive) heart failure: Secondary | ICD-10-CM | POA: Diagnosis not present

## 2017-10-17 DIAGNOSIS — M6281 Muscle weakness (generalized): Secondary | ICD-10-CM | POA: Diagnosis not present

## 2017-10-17 LAB — URINALYSIS, ROUTINE W REFLEX MICROSCOPIC
Bilirubin Urine: NEGATIVE
Glucose, UA: NEGATIVE mg/dL
HGB URINE DIPSTICK: NEGATIVE
Ketones, ur: NEGATIVE mg/dL
Nitrite: NEGATIVE
Protein, ur: 100 mg/dL — AB
Specific Gravity, Urine: 1.013 (ref 1.005–1.030)
pH: 5 (ref 5.0–8.0)

## 2017-10-17 LAB — I-STAT CHEM 8, ED
BUN: 31 mg/dL — ABNORMAL HIGH (ref 8–23)
CALCIUM ION: 1.16 mmol/L (ref 1.15–1.40)
CREATININE: 2.1 mg/dL — AB (ref 0.61–1.24)
Chloride: 103 mmol/L (ref 98–111)
GLUCOSE: 181 mg/dL — AB (ref 70–99)
HCT: 40 % (ref 39.0–52.0)
HEMOGLOBIN: 13.6 g/dL (ref 13.0–17.0)
Potassium: 4.3 mmol/L (ref 3.5–5.1)
Sodium: 134 mmol/L — ABNORMAL LOW (ref 135–145)
TCO2: 22 mmol/L (ref 22–32)

## 2017-10-17 LAB — CBC WITH DIFFERENTIAL/PLATELET
Abs Immature Granulocytes: 0 10*3/uL (ref 0.0–0.1)
BASOS ABS: 0.1 10*3/uL (ref 0.0–0.1)
BASOS PCT: 1 %
EOS ABS: 0.2 10*3/uL (ref 0.0–0.7)
EOS PCT: 2 %
HCT: 40.9 % (ref 39.0–52.0)
Hemoglobin: 13.5 g/dL (ref 13.0–17.0)
Immature Granulocytes: 0 %
Lymphocytes Relative: 18 %
Lymphs Abs: 1.4 10*3/uL (ref 0.7–4.0)
MCH: 29 pg (ref 26.0–34.0)
MCHC: 33 g/dL (ref 30.0–36.0)
MCV: 87.8 fL (ref 78.0–100.0)
Monocytes Absolute: 0.6 10*3/uL (ref 0.1–1.0)
Monocytes Relative: 7 %
Neutro Abs: 5.5 10*3/uL (ref 1.7–7.7)
Neutrophils Relative %: 72 %
PLATELETS: 276 10*3/uL (ref 150–400)
RBC: 4.66 MIL/uL (ref 4.22–5.81)
RDW: 13.7 % (ref 11.5–15.5)
WBC: 7.7 10*3/uL (ref 4.0–10.5)

## 2017-10-17 LAB — COMPREHENSIVE METABOLIC PANEL
ALT: 20 U/L (ref 0–44)
ANION GAP: 10 (ref 5–15)
AST: 20 U/L (ref 15–41)
Albumin: 3.7 g/dL (ref 3.5–5.0)
Alkaline Phosphatase: 91 U/L (ref 38–126)
BUN: 29 mg/dL — ABNORMAL HIGH (ref 8–23)
CALCIUM: 9.2 mg/dL (ref 8.9–10.3)
CO2: 21 mmol/L — AB (ref 22–32)
Chloride: 102 mmol/L (ref 98–111)
Creatinine, Ser: 2.21 mg/dL — ABNORMAL HIGH (ref 0.61–1.24)
GFR calc non Af Amer: 26 mL/min — ABNORMAL LOW (ref >60)
GFR, EST AFRICAN AMERICAN: 31 mL/min — AB (ref >60)
Glucose, Bld: 184 mg/dL — ABNORMAL HIGH (ref 70–99)
Potassium: 4.3 mmol/L (ref 3.5–5.1)
SODIUM: 133 mmol/L — AB (ref 135–145)
Total Bilirubin: 1.2 mg/dL (ref 0.3–1.2)
Total Protein: 7.3 g/dL (ref 6.5–8.1)

## 2017-10-17 LAB — CBG MONITORING, ED: Glucose-Capillary: 169 mg/dL — ABNORMAL HIGH (ref 70–99)

## 2017-10-17 LAB — PROTIME-INR
INR: 1.01
Prothrombin Time: 13.2 seconds (ref 11.4–15.2)

## 2017-10-17 LAB — I-STAT TROPONIN, ED: TROPONIN I, POC: 0.01 ng/mL (ref 0.00–0.08)

## 2017-10-17 LAB — APTT: APTT: 31 s (ref 24–36)

## 2017-10-17 MED ORDER — LORAZEPAM 2 MG/ML IJ SOLN
0.5000 mg | Freq: Once | INTRAMUSCULAR | Status: AC | PRN
  Administered 2017-10-17: 0.5 mg via INTRAVENOUS
  Filled 2017-10-17: qty 1

## 2017-10-17 NOTE — ED Notes (Signed)
Patient transported to MRI 

## 2017-10-17 NOTE — ED Triage Notes (Signed)
Pt had recent stroke this past weekend affecting right arm. Pt d/ced on Monday and states his right arm strength was much better. Pt woke up this morning and at 8:30 started having bilateral leg weakness. Pt also felt dizzy. Right arm felt "floppy". This RN had pt evaluated by MD, no code stroke. Pt is able to lift bilateral arms, no droop. No facial droop or numbness, no slurred speech.

## 2017-10-17 NOTE — ED Notes (Signed)
Patient verbalizes understanding of discharge instructions and follow up. No further questions at this time. VSS and ambulatory at discharge.

## 2017-10-17 NOTE — ED Provider Notes (Signed)
83 year old male with recent stroke, scheduled for carotid endarterectomy in 2 days, here with concern for possible extension with new right leg involvement.  I suspect this is likely an evolution of his recent stroke.  Neurology was initially consulted, and felt that MRI should be obtained.  If negative, they feel the patient can be discharged.  Patient is Marco Acevedo optimized medically.  MRI obtained and is negative for new stroke.  He has expected evolution of his old strokes.  Patient appears well on my exam and is able to ambulate.  Of note, patient has not been eating and drinking since his recent discharge, because he is concerned about causing a new stroke or worsening his lipids.  I discussed that is important for him to stay hydrated, eat regularly, and avoid anything that would stress his physiology given his recent stroke.  Return precautions given.  Otherwise, given his stable neuro exam, I feel is reasonable to be discharged.   Duffy Bruce, MD 10/17/17 2130

## 2017-10-17 NOTE — ED Provider Notes (Signed)
[facility] EMERGENCY DEPARTMENT Provider Note   CSN: [id number] Arrival date & time: 10/17/17  1039     History   Chief Complaint Chief Complaint  Patient presents with  . Stroke Symptoms    HPI Marco Acevedo is a 83 y.o. male.  HPI Patient presents with difficulty walking.  Recent admission to the hospital for right-sided stroke.  Reportedly had weakness in right arm but not leg although they were expecting him to have weakness in his leg.  Discharge from the hospital 2 or 3 days ago.  Today he began to have some weakness in his bilateral legs around 830.  Felt a little dizzy.  States arm may be is a little weaker than his baseline.  No headache.  No confusion.  He is scheduled to have his left-sided carotid endarterectomy done in 2 days.  He is on aspirin and Plavix. Past Medical History:  Diagnosis Date  . Anemia    low iron  . BPH (benign prostatic hyperplasia)   . Bruises easily   . CAD (coronary artery disease)    a. per Dr. Thompson Caul note, prior stenting of Cx in 2007 and 2012. b. DES to Cx in 09/2012.  [name] Chronic diastolic CHF (congestive heart failure) ([place])   . CKD (chronic kidney disease), stage [name] ([place])   . Claustrophobia   . Complication of anesthesia    has to have head elevated to keep from strangling on post nasal drip  . COPD (chronic obstructive pulmonary disease) ([place])   . Diabetes mellitus type II    type 2  . Diarrhea 2015   had for a year and a half  . GERD (gastroesophageal reflux disease)   . Gout   . Granuloma annulare   . History of hiatal hernia   . History of kidney stones   . Hyperlipidemia   . Hypertension   . Neuropathy   . Obesity   . OSA (obstructive sleep apnea)   . Right sided weakness   . Wears dentures    top  . Wears glasses     Patient Active Problem List   Diagnosis Date Noted  . Right sided weakness   . Epigastric abdominal tenderness 03/22/2016  . Hx of colonic polyps 03/22/2016  . Status post  surgery 09/23/2014  . Gallstones 09/23/2014  . Parotid mass   . Essential hypertension 05/29/2013  . CKD (chronic kidney disease) stage 3, GFR 30-59 ml/min (HCC) 09/13/2012    Class: Chronic  . Coronary artery disease involving native coronary artery of native heart with angina pectoris ([place]) 09/12/2012    Class: Acute  . Pure hypercholesterolemia 09/01/2012  . URI (upper respiratory infection) 03/05/2012  . COPD (chronic obstructive pulmonary disease) ([place]) 05/11/2011  . OSA (obstructive sleep apnea) 05/11/2011    Past Surgical History:  Procedure Laterality Date  . BLEPHAROPLASTY    . CARDIAC CATHETERIZATION  [phone]   X 2 stents  . CHOLECYSTECTOMY  09/23/2014  . CHOLECYSTECTOMY N/A 09/23/2014   Procedure: LAPAROSCOPIC CHOLECYSTECTOMY WITH INTRAOPERATIVE CHOLANGIOGRAM;  Surgeon: Autumn Messing [name], MD;  Location: [facility];  Service: General;  Laterality: N/A;  . COLONOSCOPY    . COLONOSCOPY WITH PROPOFOL N/A 03/22/2016   Procedure: COLONOSCOPY WITH PROPOFOL;  Surgeon: Wilford Corner, MD;  Location: [facility] ENDOSCOPY;  Service: Endoscopy;  Laterality: N/A;  . ESOPHAGOGASTRODUODENOSCOPY (EGD) WITH PROPOFOL N/A 03/22/2016   Procedure: ESOPHAGOGASTRODUODENOSCOPY (EGD) WITH PROPOFOL;  Surgeon: Wilford Corner, MD;  Location: [facility] ENDOSCOPY;  Service: Endoscopy;  Laterality: N/A;  . EYE SURGERY     both cataracts  . KNEE ARTHROSCOPY WITH MEDIAL MENISECTOMY Right 08/19/2013   Procedure: RIGHT KNEE ARTHROSCOPY WITH PARTIAL MEDIAL MENISECTOMY AND CHONDROPLASTY;  Surgeon: Hessie Dibble, MD;  Location: [facility];  Service: Orthopedics;  Laterality: Right;  . LEFT HEART CATH AND CORONARY ANGIOGRAPHY N/A 06/28/2016   Procedure: Left Heart Cath and Coronary Angiography;  Surgeon: Belva Crome, MD;  Location: [facility] CV LAB;  Service: Cardiovascular;  Laterality: N/A;  . LEFT HEART CATHETERIZATION WITH CORONARY ANGIOGRAM N/A 10/03/2011   Procedure: LEFT HEART CATHETERIZATION WITH  CORONARY ANGIOGRAM;  Surgeon: Sinclair Grooms, MD;  Location: [facility] CATH LAB;  Service: Cardiovascular;  Laterality: N/A;  . LEFT HEART CATHETERIZATION WITH CORONARY ANGIOGRAM N/A 09/12/2012   Procedure: LEFT HEART CATHETERIZATION WITH CORONARY ANGIOGRAM;  Surgeon: Sinclair Grooms, MD;  Location: [facility] C[facility] LAB;  Service: Cardiovascular;  Laterality: N/A;  . LIPOMA EXCISION     Biospy only; left parotid gland  . none    . PERCUTANEOUS CORONARY STENT INTERVENTION (PCI-S) N/A 09/17/2012   Procedure: PERCUTANEOUS CORONARY STENT INTERVENTION (PCI-S);  Surgeon: Sinclair Grooms, MD;  Location: [facility] CATH LAB;  Service: Cardiovascular;  Laterality: N/A;  . TRIGGER FINGER RELEASE Left 12/21/2015   Procedure: LEFT LONG FINGER TRIGGER RELEASE ;  Surgeon: Milly Jakob, MD;  Location: [facility];  Service: Orthopedics;  Laterality: Left;        Home Medications    Prior to Admission medications   Medication Sig Start Date End Date Taking? Authorizing Provider  albuterol (PROVENTIL HFA) 108 (90 BASE) MCG/ACT inhaler Inhale 2 puffs into the lungs every 6 (six) hours as needed for wheezing or shortness of breath.    Yes [provider]  albuterol (PROVENTIL) (2.5 MG/3ML) 0.083% nebulizer solution Take 2.5 mg by nebulization every 6 (six) hours as needed for wheezing or shortness of breath.    Yes [provider]  allopurinol (ZYLOPRIM) 100 MG tablet Take 100 mg by mouth daily.   Yes [provider]  alum & mag hydroxide-simeth (MAALOX/MYLANTA) 200-200-20 MG/5ML suspension Take 30 mLs by mouth as needed for indigestion or heartburn.   Yes [provider]  amLODipine (NORVASC) 10 MG tablet Take 1 tablet (10 mg total) by mouth daily. 10/15/17 10/15/18 Yes Thurnell Lose, MD  aspirin EC 81 MG tablet Take 81 mg by mouth daily.   Yes [provider]  atorvastatin (LIPITOR) 80 MG tablet Take 1 tablet (80 mg total) by mouth daily at 6 PM. 10/15/17  Yes Thurnell Lose, MD  clopidogrel (PLAVIX) 75 MG tablet Take 1 tablet (75 mg total) by mouth daily. 10/16/17  Yes Thurnell Lose, MD  docusate sodium (STOOL SOFTENER) 100 MG capsule Take 100 mg by mouth daily.   Yes [provider]  Ferrous Sulfate (IRON) 325 (65 FE) MG TABS Take 1 tablet by mouth daily.   Yes [provider]  finasteride (PROSCAR) 5 MG tablet Take 1 tablet (5 mg total) by mouth daily. 11/29/11  Yes Rigoberto Noel, MD  glipiZIDE (GLUCOTROL) 5 MG tablet Take 1 tablet (5 mg total) by mouth daily before breakfast. Patient taking differently: Take 5 mg by mouth 2 (two) times daily before a meal.  06/30/16  Yes Barrett, Evelene Croon, PA-C  guaifenesin (HUMIBID E) 400 MG TABS tablet Take 400 mg by mouth at bedtime.    Yes [provider]  isosorbide mononitrate (IMDUR) 120 MG 24 hr tablet Take 1 tablet (120 mg total) by mouth daily. 11/20/14  Yes Belva Crome, MD  nitroGLYCERIN (NITROSTAT) 0.4 MG SL tablet Place 0.4 mg under the tongue every 5 (five) minutes as needed for chest pain.   Yes [provider]  primidone (MYSOLINE) 50 MG tablet Take 50 mg by mouth at bedtime.    Yes [provider]  ranitidine (ZANTAC) 150 MG tablet Take 150 mg by mouth 2 (two) times daily.   Yes [provider]  traMADol (ULTRAM) 50 MG tablet Take 50 mg by mouth at bedtime as needed for moderate pain.   Yes [provider]  vitamin B-12 (CYANOCOBALAMIN) 1000 MCG tablet Take 1,000 mcg by mouth daily.   Yes [provider]    Family History Family History  Problem Relation Age of Onset  . Aortic aneurysm Father   . Aneurysm Mother        brain  . Cancer Brother   . COPD Sister   . Other Sister        health hx unknown  . Other Sister        health hx unknown  . Other Brother        health hx unknown    Social History Social History   Tobacco Use  . Smoking status: Former Smoker    Packs/day: 1.00    Years: 64.00    Pack years:  64.00    Types: Cigarettes    Last attempt to quit: 05/29/2011    Years since quitting: 6.3  . Smokeless tobacco: Never Used  Substance Use Topics  . Alcohol use: Yes    Comment: rarely  . Drug use: No     Allergies   Cimetidine   Review of Systems Review of Systems  Constitutional: Negative for appetite change.  HENT: Negative for congestion.   Respiratory: Negative for chest tightness and shortness of breath.   Cardiovascular: Negative for chest pain.  Gastrointestinal: Negative for abdominal pain.  Genitourinary: Negative for flank pain.  Musculoskeletal: Positive for gait problem. Negative for joint swelling and neck pain.  Skin: Negative for rash.  Neurological: Positive for dizziness and weakness.  Psychiatric/Behavioral: Negative for confusion.     Physical Exam Updated Vital Signs BP (!) 141/78   Pulse 83   Temp 98.8 F (37.1 C) (Oral)   Resp 17   Ht 5\' 8"  (1.727 m)   Wt 93 kg (205 lb)   SpO2 96%   BMI 31.17 kg/m   Physical Exam  Constitutional: He is oriented to person, place, and time. He appears well-developed.  HENT:  Head: Atraumatic.  Eyes: EOM are normal.  Neck: Neck supple.  Cardiovascular: Normal rate.  Pulmonary/Chest: Effort normal.  Abdominal: Soft.  Musculoskeletal: He exhibits no tenderness.  Neurological: He is alert and oriented to person, place, and time. No cranial nerve deficit.  Finger-nose intact bilaterally.  Good grip strength bilaterally.  Good straight leg raise bilaterally.  Able to stand/Crouch some.  However with ambulation he does appear to without assistance and able to bend a little dragging of the right lower extremity.  Reportedly does not do this at his baseline.  Heel shin intact bilaterally.  Skin: Skin is warm.     ED Treatments / Results  Labs (all labs ordered are listed, but only abnormal results are displayed) Labs Reviewed  COMPREHENSIVE METABOLIC PANEL - Abnormal; Notable for the following components:  Result Value   Sodium 133 (*)    CO2 21 (*)    Glucose, Bld 184 (*)    BUN 29 (*)    Creatinine, Ser 2.21 (*)    GFR calc non Af Amer 26 (*)    GFR calc Af Amer 31 (*)    All other components within normal limits  URINALYSIS, ROUTINE W REFLEX MICROSCOPIC - Abnormal; Notable for the following components:   Protein, ur 100 (*)    Leukocytes, UA TRACE (*)    Bacteria, UA RARE (*)    All other components within normal limits  CBG MONITORING, ED - Abnormal; Notable for the following components:   Glucose-Capillary 169 (*)    All other components within normal limits  I-STAT CHEM 8, ED - Abnormal; Notable for the following components:   Sodium 134 (*)    BUN 31 (*)    Creatinine, Ser 2.10 (*)    Glucose, Bld 181 (*)    All other components within normal limits  CBC WITH DIFFERENTIAL/PLATELET  PROTIME-INR  APTT  I-STAT TROPONIN, ED    EKG EKG Interpretation  Date/Time:  Wednesday October 17 2017 10:55:15 EDT Ventricular Rate:  84 PR Interval:  168 QRS Duration: 72 QT Interval:  364 QTC Calculation: 430 R Axis:   27 Text Interpretation:  Normal sinus rhythm Low voltage QRS Cannot rule out Anterior infarct , age undetermined Abnormal ECG Confirmed by Davonna Belling [phone]) on 10/17/2017 1:25:43 PM   Radiology Ct Head Wo Contrast  Result Date: 10/17/2017 CLINICAL DATA:  Arm weakness several days ago, now with bilateral leg weakness and slurred speech EXAM: CT HEAD WITHOUT CONTRAST TECHNIQUE: Contiguous axial images were obtained from the base of the skull through the vertex without intravenous contrast. COMPARISON:  MR brain of 10/13/2016 FINDINGS: Brain: The ventricular system remains dilated and the cortical sulci are diffusely prominent consistent with diffuse atrophy. The septum is midline in position. Moderate small vessel ischemic changes again noted throughout the periventricular white matter. No extension of the MR demonstrated small left MCA infarct is evident on CT. No  hemorrhage or mass effect is noted. Vascular: No vascular abnormality is seen other than calcification of the vertebral and carotid arteries. Skull: On bone window images, no calvarial abnormality is seen. Degenerative changes are noted at the C1-2 level. Sinuses/Orbits: There is a small amount of fluid within the lateral right frontal sinus but otherwise sinuses are well pneumatized. Other: None. IMPRESSION: 1. Stable atrophy and moderate small vessel ischemic change. No extension of the MR demonstrated small left MCA infarct is seen and no hemorrhage is noted. 2. Small amount of fluid in the right frontal sinus. Electronically Signed   By: Ivar Drape M.D.   On: 10/17/2017 11:59    Procedures Procedures (including critical care time)  Medications Ordered in ED Medications  LORazepam (ATIVAN) injection 0.5 mg (has no administration in time range)     Initial Impression / Assessment and Plan / ED Course  I have reviewed the triage vital signs and the nursing notes.  Pertinent labs & imaging results that were available during my care of the patient were reviewed by me and considered in my medical decision making (see chart for details).     Patient with right lower extremity weakness.  Recent stroke.  Discussed with neurology.  Had recent stroke work-up.  Will repeat patient's MRI to see if there is a new stroke.  If not he is likely able to go home and  follow-up as an outpatient.  If new stroke likely medicine admission.  Is also scheduled for carotid endarterectomy in 2 days.  Kidney function has improved back to near baseline level.  Not a TPA candidate due to mildness of the symptoms.  Final Clinical Impressions(s) / ED Diagnoses   Final diagnoses:  Weakness of right lower extremity    ED Discharge Orders    None       Davonna Belling, MD 10/17/17 1517

## 2017-10-17 NOTE — Discharge Instructions (Addendum)
As we discussed, continue to eat small meals frequently, drink plenty of fluids.  Take your medications as prescribed.

## 2017-10-18 ENCOUNTER — Other Ambulatory Visit: Payer: Self-pay

## 2017-10-18 ENCOUNTER — Encounter (HOSPITAL_COMMUNITY): Payer: Self-pay | Admitting: *Deleted

## 2017-10-18 ENCOUNTER — Telehealth: Payer: Self-pay | Admitting: *Deleted

## 2017-10-18 DIAGNOSIS — E1140 Type 2 diabetes mellitus with diabetic neuropathy, unspecified: Secondary | ICD-10-CM | POA: Diagnosis not present

## 2017-10-18 DIAGNOSIS — N183 Chronic kidney disease, stage 3 (moderate): Secondary | ICD-10-CM | POA: Diagnosis not present

## 2017-10-18 DIAGNOSIS — R05 Cough: Secondary | ICD-10-CM | POA: Diagnosis not present

## 2017-10-18 DIAGNOSIS — I69359 Hemiplegia and hemiparesis following cerebral infarction affecting unspecified side: Secondary | ICD-10-CM | POA: Diagnosis not present

## 2017-10-18 NOTE — Patient Outreach (Signed)
[facility]) Care Management  10/18/2017  Marco Acevedo 07/24/1934 [id number]  EMMI: stroke red alert Referral date: 10/17/17 Referral reason: feeling worse overall: yes, new problems walking/talking/ speaking/ seeing: yes Insurance: Medicare Day # 1  Telephone call to patient regarding EMMI stroke red alert. HIPAA verified by patient. Patient gave verbal authorization to speak with his wife, [name] regarding his health information. Patient states he went to the emergency room on yesterday due to his leg giving out on him. Patient states he had a MRI which came out ok. Patient states the doctor said he was having residual effects from his initial stroke.  Patient states he is schedule to have carotid artery surgery on 10/19/17.  Patient reports he is scheduled to see his primary MD on today.  Denies having neurology follow up appointment at this time. Patient states he was told before discharge from the hospital that the neurology office would call him to set up appointment. Patient states he has transportation to his appointments.  Patient states he has his medications and takes them as prescribed. He states he is taking aspirin and plavix.  RNCM discussed signs/ symptoms of bleeding with patient. Advised patient to contact his doctor for symptoms. RNCM discussed stroke signs/ symptoms with patient. Advised to call 911 for stroke like symptoms.  Patient states he has not scheduled outreach therapy yet.  He will set his therapy up after his surgery.  Patient denies any further needs at this time. RNCM offered to have follow up call with patient post surgery within 1 week.  Patient verbally agreed.   PLAN:  RNCM will follow up with patient within 1 week.  RNCM will send patient [facility] care management brochure/ magnet.   Quinn Plowman RN,BSN,CCM [facility] Telephonic  [phone]

## 2017-10-18 NOTE — Telephone Encounter (Signed)
Verified with Dr. Donnetta Hutching to continue ASA and Plavix . Can hold am  of procedure. Information relayed to patient's wife.

## 2017-10-18 NOTE — Progress Notes (Addendum)
Anesthesia Chart Review:  Case:  [id number] Date/Time:  10/19/17 1200   Procedure:  ENDARTERECTOMY CAROTID LEFT (Left )   Anesthesia type:  General   Pre-op diagnosis:  LEFT CAROTID STENOSIS   Location:  [facility] OR ROOM 11 / [place] [facility]   Surgeon:  [name], MD      DISCUSSION: 83 yo male former smoker scheduled for above procedure. Pertinent medical hx includes CAD (prior stenting of Cx in 2007 and 2012. b. DES to Cx in 09/2012), Chronic DHF, DMII, CKD, HTN, obesity and OSA.  Pt was hospitalized 7/6-10/15/2017 for left MCA stroke. He was seen by Dr. Donnetta Hutching who recommended expedient intervention with left CEA. Per Dr. Donnetta Hutching he will continue ASA and Plavix  Pt was seen at [facility] ED on 10/17/2017 for leg weakness. Workup was negative for new stroke, felt to be evolution of CVA from 10/13/2017.   Per pt's last cardiology appt with Dr. Daneen Schick 01/10/2017 he has stable angina that is not reliably related to exertion. Symptoms sometimes respond to carafate, occasionally requires nitroglycerin. Dr. Tamala Julian reviewed the pt's most recent angiogram from March 2018 and felt it was appropriate to continue medical management, per his note "With angina, variable threshold, raising the possibility that coronary vasoconstriction may have a role. Moderate obstructive disease in the circumflex at the time of most recent angiogram in March. Continue current course."  Of note the patient had an MRI c-spine at [facility] March 2019 (results in care everywhere) that showed multilevel severe cervical stenosis. I spoke with the pt and he stated that at that time of the scan he was having pain in is right arm in somewhat similar distribution as the weakness he experienced with his CVA. Cervical spine is being followed by by Dr. Normajean Glasgow and per his notes the pt was not having any associated myelopathic symptoms.   Anticipate he can proceed with surgery as planned barring acute status change.  VS: There were no vitals taken for this  visit.  PROVIDERS: Lawerance Cruel, MD   Daneen Schick MD is cardiologist last seen 01/10/2017  Estanislado Emms MD is Nephrologist with [facility] last seen 05/02/2017 with Cr 1.72 at that time   LABS: Pt with elevated creatinine, known CKD and per notes his baseline Cr is approx. 2.0  Creatinine, Ser (mg/dL)  Date Value  10/17/2017 2.10 (H)  10/17/2017 2.21 (H)  10/13/2017 3.30 (H)  10/13/2017 3.08 (H)  08/23/2016 1.95 (H)  07/03/2016 2.03 (H)   (all labs ordered are listed, but only abnormal results are displayed)  Lab Results  Component Value Date   WBC 7.7 10/17/2017   HGB 13.6 10/17/2017   HCT 40.0 10/17/2017   PLT 276 10/17/2017   GLUCOSE 181 (H) 10/17/2017   CHOL 220 (H) 10/14/2017   TRIG 156 (H) 10/14/2017   HDL 39 (L) 10/14/2017   LDLCALC 150 (H) 10/14/2017   ALT 20 10/17/2017   AST 20 10/17/2017   NA 134 (L) 10/17/2017   K 4.3 10/17/2017   CL 103 10/17/2017   CREATININE 2.10 (H) 10/17/2017   BUN 31 (H) 10/17/2017   CO2 21 (L) 10/17/2017   TSH 1.284 06/27/2016   INR 1.01 10/17/2017   HGBA1C 7.0 (H) 10/14/2017    IMAGES: MRA 10/13/17: IMPRESSION: 1. Small acute left MCA territory infarcts. 2. Small chronic left MCA infarcts and moderate chronic small vessel ischemic white matter disease. 3. Motion degraded head MRA without large vessel occlusion or definite flow  limiting proximal stenosis. 4. Limited neck MRA due to motion and noncontrast technique. Moderate to severe left ICA origin stenosis.  CHEST - 2 VIEW 10/13/2017  FINDINGS: AP and lateral views were obtained. The lungs are clear without focal pneumonia, edema, pneumothorax or pleural effusion. Cardiopericardial silhouette is at upper limits of normal for size. The visualized bony structures of the thorax are intact. Telemetry leads overlie the chest.  IMPRESSION: No active cardiopulmonary disease.  MRI C-Spine 06/18/2017 FINDINGS:  -Craniovertebral and cervicomedullary  junctions are normal. -There is cervical curvature convex to the left and cervicothoracic curvature convex to the right. Posterior bony alignment is normal study cervical spine except for mild anterolisthesis of C7 on T1 / shallow neural arch contributing to component of congenital stenosis. This is exacerbated by bulging discs and mid and lower cervical regions., With cord contact and partial cord compression.   EKG: 10/17/2017: Normal sinus rhythm.  Low voltage QRS.  Cannot rule out anterior infarct, age undetermined. 10/13/2017: Normal sinus rhythm normal E CG  CV:  Carotid artery duplex  10/15/17. Right Internal Carotid Artery 1-39%. Left Internal Carotid Artery 40-59%. Vertebral arteries are patent with antegrade flow.  ECHO 10/14/2017 - Left ventricle: The cavity size was normal. There was mild   concentric hypertrophy. Systolic function was normal. The   estimated ejection fraction was in the range of 55% to 60%. Wall   motion was normal; there were no regional wall motion   abnormalities. Doppler parameters are consistent with abnormal   left ventricular relaxation (grade 1 diastolic dysfunction).   Doppler parameters are consistent with elevated ventricular   end-diastolic filling pressure. - Aortic valve: There was mild stenosis. There was no   regurgitation. - Left atrium: The atrium was mildly dilated. - Right ventricle: The cavity size was normal. Wall thickness was   normal. Systolic function was normal. - Right atrium: The atrium was normal in size. - Tricuspid valve: There was no regurgitation. - Pulmonary arteries: Systolic pressure was within the normal   range. - Inferior vena cava: The vessel was normal in size. - Pericardium, extracardiac: A trivial pericardial effusion was   identified.  Impressions:  - No cardiac source of emboli was indentified.  LHC 3/21/208  No major change since the post PCI angiogram in 2014.   There is calcification with globular  proximal LAD 40% stenosis.  The circumflex ostium contains eccentric 40% narrowing followed by proximal circumflex stent which contains up to 50% ISR. The non-stented mid segment contains diffuse 50-60% narrowing. The distal first obtuse marginal stent contains 50-60% proximal edge in-stent restenosis.  The right coronary contains diffuse disease up to 50% throughout the proximal mid and distal segment.   Left ventriculography was not performed because of kidney impairment. LVEDP was markedly elevated at 27 mmHg.  RECOMMENDATIONS:   Aggressive blood pressure control. Add amlodipine 5 mg daily. May need low-dose diuretic therapy  Sublingual nitroglycerin as needed to control angina. Continue Ranexa as previously prescribed.  Clinical follow-up in 7-10 days.  Follow kidney function closely after contrast exposure, 95 cc used.  Stress Test 11/25/2014  Nuclear stress EF: 73%.  There was no ST segment deviation noted during stress.  This is an intermediate risk study.  The left ventricular ejection fraction is hyperdynamic (>65%).   There was a medium-sized, mild primarily reversible basal to mid inferolateral perfusion defect and a small reversible apical septal perfusion defect with normal wall motion.  It is possible that this represents primarily ischemia, but the  stress study is overall count poor compared to the rest.  Overall, probably intermediate risk due to the extent of the defects.      Past Medical History:  Diagnosis Date  . Anemia    low iron  . BPH (benign prostatic hyperplasia)   . Bruises easily   . CAD (coronary artery disease)    a. per Dr. Thompson Caul note, prior stenting of Cx in 2007 and 2012. b. DES to Cx in 09/2012.  [name] Chronic diastolic CHF (congestive heart failure) ([place])   . CKD (chronic kidney disease), stage [name] ([place])   . Claustrophobia   . Complication of anesthesia    has to have head elevated to keep from strangling on post nasal drip  . COPD  (chronic obstructive pulmonary disease) ([place])   . Diabetes mellitus type II    type 2  . Diarrhea 2015   had for a year and a half  . GERD (gastroesophageal reflux disease)   . Gout   . Granuloma annulare   . History of hiatal hernia   . History of kidney stones   . Hyperlipidemia   . Hypertension   . Neuropathy   . Obesity   . OSA (obstructive sleep apnea)   . Right sided weakness   . Wears dentures    top  . Wears glasses     Past Surgical History:  Procedure Laterality Date  . BLEPHAROPLASTY    . CARDIAC CATHETERIZATION  [phone]   X 2 stents  . CHOLECYSTECTOMY  09/23/2014  . CHOLECYSTECTOMY N/A 09/23/2014   Procedure: LAPAROSCOPIC CHOLECYSTECTOMY WITH INTRAOPERATIVE CHOLANGIOGRAM;  Surgeon: Autumn Messing [name], MD;  Location: [place];  Service: General;  Laterality: N/A;  . COLONOSCOPY    . COLONOSCOPY WITH PROPOFOL N/A 03/22/2016   Procedure: COLONOSCOPY WITH PROPOFOL;  Surgeon: Wilford Corner, MD;  Location: [facility] ENDOSCOPY;  Service: Endoscopy;  Laterality: N/A;  . ESOPHAGOGASTRODUODENOSCOPY (EGD) WITH PROPOFOL N/A 03/22/2016   Procedure: ESOPHAGOGASTRODUODENOSCOPY (EGD) WITH PROPOFOL;  Surgeon: Wilford Corner, MD;  Location[facility] ENDOSCOPY;  Service: Endoscopy;  Laterality: N/A;  . EYE SURGERY     both cataracts  . KNEE ARTHROSCOPY WITH MEDIAL MENISECTOMY Right 08/19/2013   Procedure: RIGHT KNEE ARTHROSCOPY WITH PARTIAL MEDIAL MENISECTOMY AND CHONDROPLASTY;  Surgeon: Hessie Dibble, MD;  Location: [place];  Service: Orthopedics;  Laterality: Right;  . LEFT HEART CATH AND CORONARY ANGIOGRAPHY N/A 06/28/2016   Procedure: Left Heart Cath and Coronary Angiography;  Surgeon: Belva Crome, MD;  Location: [place] CV LAB;  Service: Cardiovascular;  Laterality: N/A;  . LEFT HEART CATHETERIZATION WITH CORONARY ANGIOGRAM N/A 10/03/2011   Procedure: LEFT HEART CATHETERIZATION WITH CORONARY ANGIOGRAM;  Surgeon: Sinclair Grooms, MD;  Location: [facility] CATH LAB;  Service:  Cardiovascular;  Laterality: N/A;  . LEFT HEART CATHETERIZATION WITH CORONARY ANGIOGRAM N/A 09/12/2012   Procedure: LEFT HEART CATHETERIZATION WITH CORONARY ANGIOGRAM;  Surgeon: Sinclair Grooms, MD;  Location: [facility] CATH LAB;  Service: Cardiovascular;  Laterality: N/A;  . LIPOMA EXCISION     Biospy only; left parotid gland  . none    . PERCUTANEOUS CORONARY STENT INTERVENTION (PCI-S) N/A 09/17/2012   Procedure: PERCUTANEOUS CORONARY STENT INTERVENTION (PCI-S);  Surgeon: Sinclair Grooms, MD;  Location: [facility] CATH LAB;  Service: Cardiovascular;  Laterality: N/A;  . TRIGGER FINGER RELEASE Left 12/21/2015   Procedure: LEFT LONG FINGER TRIGGER RELEASE ;  Surgeon: Milly Jakob, MD;  Location: [place];  Service: Orthopedics;  Laterality: Left;    MEDICATIONS: No current facility-administered medications for this encounter.    [name] albuterol (PROVENTIL HFA) 108 (90 BASE) MCG/ACT inhaler  . albuterol (PROVENTIL) (2.5 MG/3ML) 0.083% nebulizer solution  . allopurinol (ZYLOPRIM) 100 MG tablet  . alum & mag hydroxide-simeth (MAALOX/MYLANTA) 200-200-20 MG/5ML suspension  . amLODipine (NORVASC) 10 MG tablet  . aspirin EC 81 MG tablet  . atorvastatin (LIPITOR) 80 MG tablet  . clopidogrel (PLAVIX) 75 MG tablet  . docusate sodium (STOOL SOFTENER) 100 MG capsule  . Ferrous Sulfate (IRON) 325 (65 FE) MG TABS  . finasteride (PROSCAR) 5 MG tablet  . glipiZIDE (GLUCOTROL) 5 MG tablet  . guaifenesin (HUMIBID E) 400 MG TABS tablet  . isosorbide mononitrate (IMDUR) 120 MG 24 hr tablet  . nitroGLYCERIN (NITROSTAT) 0.4 MG SL tablet  . primidone (MYSOLINE) 50 MG tablet  . ranitidine (ZANTAC) 150 MG tablet  . traMADol (ULTRAM) 50 MG tablet  . vitamin B-12 (CYANOCOBALAMIN) 1000 MCG tablet    Wynonia Musty [facility] Short Stay Center/Anesthesiology Phone [phone] 10/18/2017 4:05 PM

## 2017-10-19 ENCOUNTER — Inpatient Hospital Stay (HOSPITAL_COMMUNITY): Payer: Medicare Other | Admitting: Physician Assistant

## 2017-10-19 ENCOUNTER — Encounter (HOSPITAL_COMMUNITY): Payer: Self-pay

## 2017-10-19 ENCOUNTER — Encounter (HOSPITAL_COMMUNITY)
Admission: RE | Disposition: A | Discharge status: Home / Self Care | Payer: Self-pay | Source: Home / Self Care | Attending: Vascular Surgery

## 2017-10-19 ENCOUNTER — Inpatient Hospital Stay (HOSPITAL_COMMUNITY)
Admission: RE | Admit: 2017-10-19 | Discharge: 2017-10-22 | DRG: 038 | Disposition: A | Discharge status: Home / Self Care | Payer: Medicare Other | Attending: Vascular Surgery | Admitting: Vascular Surgery

## 2017-10-19 DIAGNOSIS — Z6831 Body mass index (BMI) 31.0-31.9, adult: Secondary | ICD-10-CM | POA: Diagnosis not present

## 2017-10-19 DIAGNOSIS — I130 Hypertensive heart and chronic kidney disease with heart failure and stage 1 through stage 4 chronic kidney disease, or unspecified chronic kidney disease: Secondary | ICD-10-CM | POA: Diagnosis present

## 2017-10-19 DIAGNOSIS — Z955 Presence of coronary angioplasty implant and graft: Secondary | ICD-10-CM | POA: Diagnosis not present

## 2017-10-19 DIAGNOSIS — E669 Obesity, unspecified: Secondary | ICD-10-CM | POA: Diagnosis present

## 2017-10-19 DIAGNOSIS — Z7982 Long term (current) use of aspirin: Secondary | ICD-10-CM

## 2017-10-19 DIAGNOSIS — G4733 Obstructive sleep apnea (adult) (pediatric): Secondary | ICD-10-CM | POA: Diagnosis present

## 2017-10-19 DIAGNOSIS — M109 Gout, unspecified: Secondary | ICD-10-CM | POA: Diagnosis present

## 2017-10-19 DIAGNOSIS — Z79899 Other long term (current) drug therapy: Secondary | ICD-10-CM | POA: Diagnosis not present

## 2017-10-19 DIAGNOSIS — I2510 Atherosclerotic heart disease of native coronary artery without angina pectoris: Secondary | ICD-10-CM | POA: Diagnosis present

## 2017-10-19 DIAGNOSIS — R490 Dysphonia: Secondary | ICD-10-CM | POA: Diagnosis not present

## 2017-10-19 DIAGNOSIS — Z86010 Personal history of colonic polyps: Secondary | ICD-10-CM

## 2017-10-19 DIAGNOSIS — Z87442 Personal history of urinary calculi: Secondary | ICD-10-CM

## 2017-10-19 DIAGNOSIS — N183 Chronic kidney disease, stage 3 (moderate): Secondary | ICD-10-CM | POA: Diagnosis present

## 2017-10-19 DIAGNOSIS — M1990 Unspecified osteoarthritis, unspecified site: Secondary | ICD-10-CM | POA: Diagnosis present

## 2017-10-19 DIAGNOSIS — Z7984 Long term (current) use of oral hypoglycemic drugs: Secondary | ICD-10-CM

## 2017-10-19 DIAGNOSIS — J449 Chronic obstructive pulmonary disease, unspecified: Secondary | ICD-10-CM | POA: Diagnosis present

## 2017-10-19 DIAGNOSIS — Z888 Allergy status to other drugs, medicaments and biological substances status: Secondary | ICD-10-CM | POA: Diagnosis not present

## 2017-10-19 DIAGNOSIS — Z87891 Personal history of nicotine dependence: Secondary | ICD-10-CM | POA: Diagnosis not present

## 2017-10-19 DIAGNOSIS — E785 Hyperlipidemia, unspecified: Secondary | ICD-10-CM | POA: Diagnosis present

## 2017-10-19 DIAGNOSIS — I5032 Chronic diastolic (congestive) heart failure: Secondary | ICD-10-CM | POA: Diagnosis present

## 2017-10-19 DIAGNOSIS — K219 Gastro-esophageal reflux disease without esophagitis: Secondary | ICD-10-CM | POA: Diagnosis present

## 2017-10-19 DIAGNOSIS — R0989 Other specified symptoms and signs involving the circulatory and respiratory systems: Secondary | ICD-10-CM

## 2017-10-19 DIAGNOSIS — D631 Anemia in chronic kidney disease: Secondary | ICD-10-CM | POA: Diagnosis present

## 2017-10-19 DIAGNOSIS — F40240 Claustrophobia: Secondary | ICD-10-CM | POA: Diagnosis present

## 2017-10-19 DIAGNOSIS — E1122 Type 2 diabetes mellitus with diabetic chronic kidney disease: Secondary | ICD-10-CM | POA: Diagnosis present

## 2017-10-19 DIAGNOSIS — I6522 Occlusion and stenosis of left carotid artery: Secondary | ICD-10-CM | POA: Diagnosis present

## 2017-10-19 DIAGNOSIS — R0602 Shortness of breath: Secondary | ICD-10-CM | POA: Diagnosis not present

## 2017-10-19 DIAGNOSIS — I509 Heart failure, unspecified: Secondary | ICD-10-CM | POA: Diagnosis not present

## 2017-10-19 HISTORY — DX: Cerebral infarction, unspecified: I63.9

## 2017-10-19 HISTORY — DX: Dyspnea, unspecified: R06.00

## 2017-10-19 HISTORY — DX: Unspecified osteoarthritis, unspecified site: M19.90

## 2017-10-19 HISTORY — PX: ENDARTERECTOMY: SHX5162

## 2017-10-19 LAB — URINALYSIS, ROUTINE W REFLEX MICROSCOPIC
BILIRUBIN URINE: NEGATIVE
Glucose, UA: NEGATIVE mg/dL
Hgb urine dipstick: NEGATIVE
Ketones, ur: NEGATIVE mg/dL
Leukocytes, UA: NEGATIVE
NITRITE: NEGATIVE
Protein, ur: 30 mg/dL — AB
SPECIFIC GRAVITY, URINE: 1.025 (ref 1.005–1.030)
pH: 5 (ref 5.0–8.0)

## 2017-10-19 LAB — ABO/RH: ABO/RH(D): B POS

## 2017-10-19 LAB — GLUCOSE, CAPILLARY
Glucose-Capillary: 159 mg/dL — ABNORMAL HIGH (ref 70–99)
Glucose-Capillary: 151 mg/dL — ABNORMAL HIGH (ref 70–99)
Glucose-Capillary: 145 mg/dL — ABNORMAL HIGH (ref 70–99)

## 2017-10-19 LAB — TYPE AND SCREEN
ABO/RH(D): B POS
Antibody Screen: NEGATIVE

## 2017-10-19 LAB — URINALYSIS, MICROSCOPIC (REFLEX)
Bacteria, UA: NONE SEEN
RBC / HPF: NONE SEEN RBC/hpf (ref 0–5)
Squamous Epithelial / LPF: NONE SEEN (ref 0–5)
WBC UA: NONE SEEN WBC/hpf (ref 0–5)

## 2017-10-19 SURGERY — ENDARTERECTOMY, CAROTID
Anesthesia: General | Site: Neck | Laterality: Left

## 2017-10-19 MED ORDER — FINASTERIDE 5 MG PO TABS
5.0000 mg | ORAL_TABLET | Freq: Every day | ORAL | Status: DC
  Administered 2017-10-20 – 2017-10-22 (×3): 5 mg via ORAL
  Filled 2017-10-19 (×3): qty 1

## 2017-10-19 MED ORDER — ALUM & MAG HYDROXIDE-SIMETH 200-200-20 MG/5ML PO SUSP: 30.0000 mL | ORAL | Status: DC | PRN

## 2017-10-19 MED ORDER — ALBUTEROL SULFATE HFA 108 (90 BASE) MCG/ACT IN AERS: 2.0000 | INHALATION_SPRAY | Freq: Four times a day (QID) | RESPIRATORY_TRACT | Status: DC | PRN

## 2017-10-19 MED ORDER — MIDAZOLAM HCL 2 MG/2ML IJ SOLN
INTRAMUSCULAR | Status: AC
  Filled 2017-10-19: qty 2

## 2017-10-19 MED ORDER — SODIUM CHLORIDE 0.9 % IV SOLN
INTRAVENOUS | Status: DC | PRN
  Administered 2017-10-19: 15:00:00

## 2017-10-19 MED ORDER — MIDAZOLAM HCL 5 MG/5ML IJ SOLN
INTRAMUSCULAR | Status: DC | PRN
  Administered 2017-10-19: 1 mg via INTRAVENOUS

## 2017-10-19 MED ORDER — ISOSORBIDE MONONITRATE ER 60 MG PO TB24
120.0000 mg | ORAL_TABLET | Freq: Every day | ORAL | Status: DC
  Administered 2017-10-20 – 2017-10-22 (×3): 120 mg via ORAL
  Filled 2017-10-19 (×3): qty 2

## 2017-10-19 MED ORDER — PANTOPRAZOLE SODIUM 40 MG PO TBEC
40.0000 mg | DELAYED_RELEASE_TABLET | Freq: Every day | ORAL | Status: DC
  Administered 2017-10-19 – 2017-10-22 (×4): 40 mg via ORAL
  Filled 2017-10-19 (×4): qty 1

## 2017-10-19 MED ORDER — ALLOPURINOL 100 MG PO TABS
100.0000 mg | ORAL_TABLET | Freq: Every day | ORAL | Status: DC
  Administered 2017-10-20 – 2017-10-22 (×3): 100 mg via ORAL
  Filled 2017-10-19 (×3): qty 1

## 2017-10-19 MED ORDER — SODIUM CHLORIDE 0.9 % IV SOLN
INTRAVENOUS | Status: AC
  Filled 2017-10-19: qty 1.2

## 2017-10-19 MED ORDER — SUGAMMADEX SODIUM 500 MG/5ML IV SOLN
INTRAVENOUS | Status: DC | PRN
  Administered 2017-10-19: 200 mg via INTRAVENOUS

## 2017-10-19 MED ORDER — ONDANSETRON HCL 4 MG/2ML IJ SOLN: 4.0000 mg | Freq: Four times a day (QID) | INTRAMUSCULAR | Status: DC | PRN

## 2017-10-19 MED ORDER — PROPOFOL 10 MG/ML IV BOLUS
INTRAVENOUS | Status: DC | PRN
  Administered 2017-10-19: 80 mg via INTRAVENOUS

## 2017-10-19 MED ORDER — LACTATED RINGERS IV SOLN
INTRAVENOUS | Status: DC | PRN
  Administered 2017-10-19: 13:00:00 via INTRAVENOUS

## 2017-10-19 MED ORDER — SODIUM CHLORIDE 0.9 % IV SOLN
INTRAVENOUS | Status: DC | PRN
  Administered 2017-10-19: 50 ug/min via INTRAVENOUS

## 2017-10-19 MED ORDER — ACETAMINOPHEN 325 MG PO TABS: 325.0000 mg | ORAL_TABLET | ORAL | Status: DC | PRN

## 2017-10-19 MED ORDER — 0.9 % SODIUM CHLORIDE (POUR BTL) OPTIME
TOPICAL | Status: DC | PRN
  Administered 2017-10-19: 1000 mL

## 2017-10-19 MED ORDER — MORPHINE SULFATE (PF) 2 MG/ML IV SOLN
2.0000 mg | INTRAVENOUS | Status: DC | PRN
  Administered 2017-10-19 – 2017-10-21 (×5): 2 mg via INTRAVENOUS
  Filled 2017-10-19 (×5): qty 1

## 2017-10-19 MED ORDER — DOCUSATE SODIUM 100 MG PO CAPS
100.0000 mg | ORAL_CAPSULE | Freq: Every day | ORAL | Status: DC
  Administered 2017-10-20 – 2017-10-22 (×3): 100 mg via ORAL
  Filled 2017-10-19 (×3): qty 1

## 2017-10-19 MED ORDER — PRIMIDONE 50 MG PO TABS
50.0000 mg | ORAL_TABLET | Freq: Every day | ORAL | Status: DC
  Administered 2017-10-19 – 2017-10-21 (×3): 50 mg via ORAL
  Filled 2017-10-19 (×3): qty 1

## 2017-10-19 MED ORDER — CEFAZOLIN SODIUM-DEXTROSE 2-4 GM/100ML-% IV SOLN
2.0000 g | INTRAVENOUS | Status: AC
  Administered 2017-10-19: 2 g via INTRAVENOUS
  Filled 2017-10-19: qty 100

## 2017-10-19 MED ORDER — CHLORHEXIDINE GLUCONATE 4 % EX LIQD: 60.0000 mL | Freq: Once | CUTANEOUS | Status: DC

## 2017-10-19 MED ORDER — PHENOL 1.4 % MT LIQD: 1.0000 | OROMUCOSAL | Status: DC | PRN

## 2017-10-19 MED ORDER — TRAMADOL HCL 50 MG PO TABS
50.0000 mg | ORAL_TABLET | Freq: Four times a day (QID) | ORAL | Status: DC | PRN
  Administered 2017-10-19: 50 mg via ORAL
  Filled 2017-10-19: qty 1

## 2017-10-19 MED ORDER — LABETALOL HCL 5 MG/ML IV SOLN
INTRAVENOUS | Status: DC | PRN
  Administered 2017-10-19: 5 mg via INTRAVENOUS

## 2017-10-19 MED ORDER — HEPARIN SODIUM (PORCINE) 1000 UNIT/ML IJ SOLN
INTRAMUSCULAR | Status: DC | PRN
  Administered 2017-10-19: 9000 [IU] via INTRAVENOUS

## 2017-10-19 MED ORDER — AMLODIPINE BESYLATE 10 MG PO TABS
10.0000 mg | ORAL_TABLET | Freq: Every day | ORAL | Status: DC
  Administered 2017-10-19 – 2017-10-22 (×4): 10 mg via ORAL
  Filled 2017-10-19 (×4): qty 1

## 2017-10-19 MED ORDER — CLOPIDOGREL BISULFATE 75 MG PO TABS
75.0000 mg | ORAL_TABLET | Freq: Every day | ORAL | Status: DC
  Administered 2017-10-20 – 2017-10-22 (×3): 75 mg via ORAL
  Filled 2017-10-19 (×3): qty 1

## 2017-10-19 MED ORDER — FENTANYL CITRATE (PF) 100 MCG/2ML IJ SOLN
INTRAMUSCULAR | Status: DC | PRN
  Administered 2017-10-19: 100 ug via INTRAVENOUS
  Administered 2017-10-19 (×2): 50 ug via INTRAVENOUS

## 2017-10-19 MED ORDER — INSULIN ASPART 100 UNIT/ML ~~LOC~~ SOLN
0.0000 [IU] | Freq: Three times a day (TID) | SUBCUTANEOUS | Status: DC
  Administered 2017-10-20 – 2017-10-21 (×5): 3 [IU] via SUBCUTANEOUS
  Administered 2017-10-22 (×2): 2 [IU] via SUBCUTANEOUS

## 2017-10-19 MED ORDER — LIDOCAINE HCL (PF) 1 % IJ SOLN
INTRAMUSCULAR | Status: AC
  Filled 2017-10-19: qty 30

## 2017-10-19 MED ORDER — POTASSIUM CHLORIDE CRYS ER 20 MEQ PO TBCR: 20.0000 meq | EXTENDED_RELEASE_TABLET | Freq: Every day | ORAL | Status: DC | PRN

## 2017-10-19 MED ORDER — ASPIRIN EC 81 MG PO TBEC
81.0000 mg | DELAYED_RELEASE_TABLET | Freq: Every day | ORAL | Status: DC
  Administered 2017-10-20 – 2017-10-22 (×3): 81 mg via ORAL
  Filled 2017-10-19 (×3): qty 1

## 2017-10-19 MED ORDER — FENTANYL CITRATE (PF) 250 MCG/5ML IJ SOLN
INTRAMUSCULAR | Status: AC
  Filled 2017-10-19: qty 5

## 2017-10-19 MED ORDER — PROTAMINE SULFATE 10 MG/ML IV SOLN
INTRAVENOUS | Status: AC
Start: 2017-10-19 — End: ?
  Filled 2017-10-19: qty 5

## 2017-10-19 MED ORDER — CEFAZOLIN SODIUM-DEXTROSE 2-4 GM/100ML-% IV SOLN
2.0000 g | Freq: Three times a day (TID) | INTRAVENOUS | Status: AC
  Administered 2017-10-19 – 2017-10-20 (×2): 2 g via INTRAVENOUS
  Filled 2017-10-19 (×2): qty 100

## 2017-10-19 MED ORDER — SODIUM CHLORIDE 0.9 % IV SOLN
500.0000 mL | Freq: Once | INTRAVENOUS | Status: AC | PRN
Start: 2017-10-19 — End: 2017-10-20
  Administered 2017-10-20: 500 mL via INTRAVENOUS

## 2017-10-19 MED ORDER — HYDRALAZINE HCL 20 MG/ML IJ SOLN: 5.0000 mg | INTRAMUSCULAR | Status: DC | PRN

## 2017-10-19 MED ORDER — ROCURONIUM BROMIDE 100 MG/10ML IV SOLN
INTRAVENOUS | Status: DC | PRN
  Administered 2017-10-19: 50 mg via INTRAVENOUS
  Administered 2017-10-19: 10 mg via INTRAVENOUS

## 2017-10-19 MED ORDER — GUAIFENESIN-DM 100-10 MG/5ML PO SYRP
15.0000 mL | ORAL_SOLUTION | ORAL | Status: DC | PRN
  Administered 2017-10-21 (×2): 15 mL via ORAL
  Filled 2017-10-19 (×3): qty 15

## 2017-10-19 MED ORDER — SODIUM CHLORIDE 0.9 % IV SOLN: INTRAVENOUS | Status: DC

## 2017-10-19 MED ORDER — ONDANSETRON HCL 4 MG/2ML IJ SOLN
INTRAMUSCULAR | Status: DC | PRN
  Administered 2017-10-19: 4 mg via INTRAVENOUS

## 2017-10-19 MED ORDER — BISACODYL 10 MG RE SUPP: 10.0000 mg | Freq: Every day | RECTAL | Status: DC | PRN

## 2017-10-19 MED ORDER — METOPROLOL TARTRATE 5 MG/5ML IV SOLN: 2.0000 mg | INTRAVENOUS | Status: DC | PRN

## 2017-10-19 MED ORDER — FENTANYL CITRATE (PF) 100 MCG/2ML IJ SOLN
25.0000 ug | INTRAMUSCULAR | Status: DC | PRN
  Administered 2017-10-19 (×2): 25 ug via INTRAVENOUS

## 2017-10-19 MED ORDER — TRAMADOL HCL 50 MG PO TABS: 50.0000 mg | ORAL_TABLET | Freq: Every evening | ORAL | 0 refills | Status: DC | PRN

## 2017-10-19 MED ORDER — GUAIFENESIN 200 MG PO TABS
400.0000 mg | ORAL_TABLET | Freq: Every day | ORAL | Status: DC
  Administered 2017-10-19 – 2017-10-21 (×3): 400 mg via ORAL
  Filled 2017-10-19 (×3): qty 2

## 2017-10-19 MED ORDER — FERROUS SULFATE 325 (65 FE) MG PO TABS
325.0000 mg | ORAL_TABLET | Freq: Every day | ORAL | Status: DC
  Administered 2017-10-20 – 2017-10-22 (×3): 325 mg via ORAL
  Filled 2017-10-19 (×3): qty 1

## 2017-10-19 MED ORDER — PROTAMINE SULFATE 10 MG/ML IV SOLN
INTRAVENOUS | Status: DC | PRN
  Administered 2017-10-19 (×5): 10 mg via INTRAVENOUS

## 2017-10-19 MED ORDER — ACETAMINOPHEN 325 MG RE SUPP: 325.0000 mg | RECTAL | Status: DC | PRN

## 2017-10-19 MED ORDER — NITROGLYCERIN 0.4 MG SL SUBL: 0.4000 mg | SUBLINGUAL_TABLET | SUBLINGUAL | Status: DC | PRN

## 2017-10-19 MED ORDER — ATORVASTATIN CALCIUM 80 MG PO TABS
80.0000 mg | ORAL_TABLET | Freq: Every day | ORAL | Status: DC
  Administered 2017-10-19 – 2017-10-21 (×3): 80 mg via ORAL
  Filled 2017-10-19 (×3): qty 1

## 2017-10-19 MED ORDER — HEPARIN SODIUM (PORCINE) 1000 UNIT/ML IJ SOLN
INTRAMUSCULAR | Status: AC
  Filled 2017-10-19: qty 1

## 2017-10-19 MED ORDER — ALBUTEROL SULFATE (2.5 MG/3ML) 0.083% IN NEBU
2.5000 mg | INHALATION_SOLUTION | Freq: Four times a day (QID) | RESPIRATORY_TRACT | Status: DC | PRN
  Administered 2017-10-21: 2.5 mg via RESPIRATORY_TRACT
  Filled 2017-10-19: qty 3

## 2017-10-19 MED ORDER — LABETALOL HCL 5 MG/ML IV SOLN: 10.0000 mg | INTRAVENOUS | Status: DC | PRN

## 2017-10-19 MED ORDER — MAGNESIUM SULFATE 2 GM/50ML IV SOLN: 2.0000 g | Freq: Every day | INTRAVENOUS | Status: DC | PRN

## 2017-10-19 MED ORDER — SODIUM CHLORIDE 0.9 % IV SOLN
INTRAVENOUS | Status: DC
  Administered 2017-10-19: 11:00:00 via INTRAVENOUS

## 2017-10-19 MED ORDER — FENTANYL CITRATE (PF) 100 MCG/2ML IJ SOLN
INTRAMUSCULAR | Status: AC
  Filled 2017-10-19: qty 2

## 2017-10-19 SURGICAL SUPPLY — 28 items
ADH SKN CLS APL DERMABOND .7 (GAUZE/BANDAGES/DRESSINGS) ×1
CANISTER SUCT 3000ML PPV (MISCELLANEOUS) ×3 IMPLANT
CANNULA VESSEL 3MM 2 BLNT TIP (CANNULA) ×6 IMPLANT
CATH ROBINSON RED A/P 18FR (CATHETERS) ×3 IMPLANT
CLIP LIGATING EXTRA MED SLVR (CLIP) ×3 IMPLANT
CLIP LIGATING EXTRA SM BLUE (MISCELLANEOUS) ×3 IMPLANT
CRADLE DONUT ADULT HEAD (MISCELLANEOUS) ×3 IMPLANT
DERMABOND ADVANCED (GAUZE/BANDAGES/DRESSINGS) ×2
DERMABOND ADVANCED .7 DNX12 (GAUZE/BANDAGES/DRESSINGS) ×1 IMPLANT
ELECT REM PT RETURN 9FT ADLT (ELECTROSURGICAL) ×3
ELECTRODE REM PT RTRN 9FT ADLT (ELECTROSURGICAL) ×1 IMPLANT
GLOVE SS BIOGEL STRL SZ 7.5 (GLOVE) ×1 IMPLANT
GLOVE SUPERSENSE BIOGEL SZ 7.5 (GLOVE) ×2
GOWN STRL REUS W/ TWL LRG LVL3 (GOWN DISPOSABLE) ×3 IMPLANT
GOWN STRL REUS W/TWL LRG LVL3 (GOWN DISPOSABLE) ×9
KIT BASIN OR (CUSTOM PROCEDURE TRAY) ×3 IMPLANT
KIT TURNOVER KIT B (KITS) ×3 IMPLANT
NS IRRIG 1000ML POUR BTL (IV SOLUTION) ×6 IMPLANT
PACK CAROTID (CUSTOM PROCEDURE TRAY) ×3 IMPLANT
PAD ARMBOARD 7.5X6 YLW CONV (MISCELLANEOUS) ×6 IMPLANT
PATCH HEMASHIELD 8X75 (Vascular Products) ×2 IMPLANT
SHUNT CAROTID BYPASS 10 (VASCULAR PRODUCTS) ×2 IMPLANT
SUT PROLENE 6 0 CC (SUTURE) ×3 IMPLANT
SUT VIC AB 3-0 SH 27 (SUTURE) ×6
SUT VIC AB 3-0 SH 27X BRD (SUTURE) ×2 IMPLANT
SUT VICRYL 4-0 PS2 18IN ABS (SUTURE) ×3 IMPLANT
TOWEL GREEN STERILE (TOWEL DISPOSABLE) ×3 IMPLANT
WATER STERILE IRR 1000ML POUR (IV SOLUTION) ×3 IMPLANT

## 2017-10-19 NOTE — Anesthesia Procedure Notes (Signed)
Arterial Line Insertion Start/End7/03/2018 10:45 AM, 10/19/2017 11:01 AM Performed by: Josephine Igo, CRNA, CRNA  Preanesthetic checklist: patient identified, IV checked, risks and benefits discussed, surgical consent and pre-op evaluation Lidocaine 1% used for infiltration Right, radial was placed Catheter size: 20 G Hand hygiene performed , maximum sterile barriers used  and Seldinger technique used  Attempts: 1 Procedure performed without using ultrasound guided technique. Following insertion, dressing applied and Biopatch. Post procedure assessment: normal  Patient tolerated the procedure well with no immediate complications.

## 2017-10-19 NOTE — Transfer of Care (Signed)
Immediate Anesthesia Transfer of Care Note  Patient: DILLARD PASCAL  Procedure(s) Performed: ENDARTERECTOMY CAROTID LEFT (Left Neck)  Patient Location: PACU  Anesthesia Type:General  Level of Consciousness: awake and alert   Airway & Oxygen Therapy: Patient Spontanous Breathing and Patient connected to nasal cannula oxygen  Post-op Assessment: Report given to RN and Post -op Vital signs reviewed and stable  Post vital signs: Reviewed and stable  Last Vitals:  Vitals Value Taken Time  BP    Temp    Pulse 74 10/19/2017  3:16 PM  Resp 15 10/19/2017  3:16 PM  SpO2 92 % 10/19/2017  3:16 PM  Vitals shown include unvalidated device data.  Last Pain:  Vitals:   10/19/17 1027  PainSc: 0-No pain         Complications: No apparent anesthesia complications

## 2017-10-19 NOTE — Anesthesia Preprocedure Evaluation (Addendum)
Anesthesia Evaluation  Patient identified by MRN, date of birth, ID band Patient awake    Reviewed: Allergy & Precautions, Patient's Chart, lab work & pertinent test results  Airway Mallampati: II  TM Distance: <3 FB Neck ROM: Limited    Dental  (+) Edentulous Upper, Dental Advisory Given   Pulmonary shortness of breath, COPD, former smoker,    breath sounds clear to auscultation       Cardiovascular hypertension, + angina + CAD and +CHF   Rhythm:Regular Rate:Normal     Neuro/Psych cerv stenosis and some cord compression noted MRI CVA    GI/Hepatic GERD  ,  Endo/Other  diabetes  Renal/GU Renal disease     Musculoskeletal  (+) Arthritis ,   Abdominal (+) + obese,   Peds  Hematology  (+) anemia ,   Anesthesia Other Findings   Reproductive/Obstetrics                           Anesthesia Physical Anesthesia Plan  ASA: [name]  Anesthesia Plan: General   Post-op Pain Management:    Induction: Intravenous  PONV Risk Score and Plan: 3 and Treatment may vary due to age or medical condition, Dexamethasone and Ondansetron  Airway Management Planned: Oral ETT and Video Laryngoscope Planned  Additional Equipment: Arterial line  Intra-op Plan:   Post-operative Plan: Extubation in OR  Informed Consent:   Dental advisory given  Plan Discussed with: CRNA  Anesthesia Plan Comments:        Anesthesia Quick Evaluation

## 2017-10-19 NOTE — Interval H&P Note (Signed)
History and Physical Interval Note:  10/19/2017 11:53 AM  Marco Acevedo  has presented today for surgery, with the diagnosis of LEFT CAROTID STENOSIS  The various methods of treatment have been discussed with the patient and family. After consideration of risks, benefits and other options for treatment, the patient has consented to  Procedure(s): ENDARTERECTOMY CAROTID LEFT (Left) as a surgical intervention .  The patient's history has been reviewed, patient examined, no change in status, stable for surgery.  I have reviewed the patient's chart and labs.  Questions were answered to the patient's satisfaction.     Curt Jews

## 2017-10-19 NOTE — Discharge Instructions (Signed)
° °  [facility] ° °Discharge Instructions °  °Carotid Endarterectomy (CEA) ° °Please refer to the following instructions for your post-procedure care. Your surgeon or physician assistant will discuss any changes with you. ° °Activity ° °You are encouraged to walk as much as you can. You can slowly return to normal activities but must avoid strenuous activity and heavy lifting until your doctor tell you it's okay. Avoid activities such as vacuuming or swinging a golf club. You can drive after one week if you are comfortable and you are no longer taking prescription pain medications. It is normal to feel tired for serval weeks after your surgery. It is also normal to have difficulty with sleep habits, eating, and bowel movements after surgery. These will go away with time. ° °Bathing/Showering ° °Shower daily after you go home. Do not soak in a bathtub, hot tub, or swim until the incision heals completely. ° °Incision Care ° °Shower every day. Clean your incision with mild soap and water. Pat the area dry with a clean towel. You do not need a bandage unless otherwise instructed. Do not apply any ointments or creams to your incision. You may have skin glue on your incision. Do not peel it off. It will come off on its own in about one week. Your incision may feel thickened and raised for several weeks after your surgery. This is normal and the skin will soften over time.  ° °For Men Only: It's okay to shave around the incision but do not shave the incision itself for 2 weeks. It is common to have numbness under your chin that could last for several months. ° °Diet ° °Resume your normal diet. There are no special food restrictions following this procedure. A low fat/low cholesterol diet is recommended for all patients with vascular disease. In order to heal from your surgery, it is CRITICAL to get adequate nutrition. Your body requires vitamins, minerals, and protein. Vegetables are the  best source of vitamins and minerals. Vegetables also provide the perfect balance of protein. Processed food has little nutritional value, so try to avoid this. ° °Medications ° °Resume taking all of your medications unless your doctor or physician assistant tells you not to. If your incision is causing pain, you may take over-the- counter pain relievers such as acetaminophen (Tylenol). If you were prescribed a stronger pain medication, please be aware these medications can cause nausea and constipation. Prevent nausea by taking the medication with a snack or meal. Avoid constipation by drinking plenty of fluids and eating foods with a high amount of fiber, such as fruits, vegetables, and grains.  °Do not take Tylenol if you are taking prescription pain medications. ° °Follow Up ° °Our office will schedule a follow up appointment 2-3 weeks following discharge. ° °Please call us immediately for any of the following conditions ° °Increased pain, redness, drainage (pus) from your incision site. °Fever of 101 degrees or higher. °If you should develop stroke (slurred speech, difficulty swallowing, weakness on one side of your body, loss of vision) you should call 911 and go to the nearest emergency room. ° °Reduce your risk of vascular disease: ° °Stop smoking. If you would like help call [facility] at 1-800-QUIT-NOW (1-800-784-8669) or [place] at [phone]. °Manage your cholesterol °Maintain a desired weight °Control your diabetes °Keep your blood pressure down ° °If you have any questions, please call the office at [phone]. ° °

## 2017-10-19 NOTE — Op Note (Signed)
    OPERATIVE REPORT  DATE OF SURGERY: 10/19/2017  PATIENT: Marco Acevedo, 83 y.o. male MRN: [id number]  DOB: 04-09-1935  PRE-OPERATIVE DIAGNOSIS: Symptomatic left internal carotid artery stenosis  POST-OPERATIVE DIAGNOSIS:  Same  PROCEDURE: Left carotid endarterectomy and Dacron patch angioplasty  SURGEON:  Curt Jews, M.D.  PHYSICIAN ASSISTANT: Liana Crocker, PA-C  ANESTHESIA: General  EBL: Minimal ml  Total I/O In: 1000 [I.V.:1000] Out: -   BLOOD ADMINISTERED: None  DRAINS: None  SPECIMEN: None  COUNTS CORRECT:  YES  PLAN OF CARE: PACU  PATIENT DISPOSITION:  PACU - hemodynamically stable  PROCEDURE DETAILS: The patient was taken to the operating room placed in supine position where the area of the left next prepped and draped you sterile fashion.  Incision made anterior to the sternocleidomastoid and carried down through the platysma with electrocautery.  The sternocleidomastoid reflected posteriorly and the carotid sheath was opened.  Facial vein was ligated with 2-0 silk ties and divided.  The vagus and hypoglossal nerves were identified and preserved.  The common carotid artery was encircled with an umbilical tape.  The external carotid was encircled with a blue vessel loop and the internal carotid was encircled with an umbilical tape and Rummel tourniquet.  The patient had a relatively low carotid bifurcation.  Patient was given 9000 units of intravenous heparin and after adequate circulation time the internal/external and common carotid arteries were occluded.  The common carotid artery was opened with an 11 blade and sent longitudinally with Potts scissors onto the internal carotid with Potts scissors.  Patient had a very irregular soft plaque with ulceration.  A 10 shunt was passed up the internal carotid and allowed to backbleed and then down the common carotid where secured with Rummel tourniquet's.  The endarterectomy was again on the common carotid artery and the  plaque was divided proximally with Potts scissors.  There was still thickened plaque at this level and therefore the incision was made further towards the sternal notch and the common carotid artery was exposed more proximally.  The Rummel tourniquet was shifted downward in the common carotid arteriotomy was extended.  The endarterectomy was extended as well and a nice feathering endpoint was encountered.  The endarterectomy scanted onto the bifurcation.  The external carotid was endarterectomized and eversion technique and the internal carotid was endarterectomized in an open fashion.  Remaining atheromatous debris was removed from the endarterectomy plane.  A Finesse Hemashield Dacron patch was brought into the field and was sewn as a patch angioplasty with a running 6-0 Prolene suture.  Prior to completion of the closure the shunt was removed in the usual flushing maneuvers were undertaken.  The anastomosis was then completed and flow was restored first to the external and the internal carotid artery.  Excellent flow characteristics were noted with hand-held Doppler in the internal and external carotid arteries.  Patient was given 50 mg of protamine to reverse the heparin.  Wounds irrigated with saline.  Hemostasis talus cautery.  Wounds were closed with 3-0 Vicryl to reapproximate sternocleidomastoid over the carotid sheath.  Next the platysma was closed with running 3-0 Vicryl suture and finally the skin was closed with a 4-0 subcuticular Vicryl suture.  Sterile dressing was applied and the patient was transferred to the recovery room.  He was awakened in the operating room neurologically unchanged from his preop.   [name], M.D., [facility] 10/19/2017 3:16 PM

## 2017-10-19 NOTE — Anesthesia Procedure Notes (Signed)
Procedure Name: Intubation Date/Time: 10/19/2017 12:41 PM Performed by: Eligha Bridegroom, CRNA Pre-anesthesia Checklist: Patient identified, Emergency Drugs available, Suction available, Patient being monitored and Timeout performed Patient Re-evaluated:Patient Re-evaluated prior to induction Oxygen Delivery Method: Circle system utilized Preoxygenation: Pre-oxygenation with 100% oxygen Induction Type: IV induction Ventilation: Mask ventilation with difficulty and Oral airway inserted - appropriate to patient size Laryngoscope Size: Glidescope Grade View: Grade II Tube type: Oral Tube size: 7.5 mm Number of attempts: 1 Airway Equipment and Method: Stylet and Video-laryngoscopy Placement Confirmation: ETT inserted through vocal cords under direct vision,  positive ETCO2 and breath sounds checked- equal and bilateral Secured at: 22 cm Tube secured with: Tape Dental Injury: Teeth and Oropharynx as per pre-operative assessment

## 2017-10-20 LAB — BASIC METABOLIC PANEL
Anion gap: 9 (ref 5–15)
BUN: 35 mg/dL — AB (ref 8–23)
CHLORIDE: 106 mmol/L (ref 98–111)
CO2: 22 mmol/L (ref 22–32)
Calcium: 8.4 mg/dL — ABNORMAL LOW (ref 8.9–10.3)
Creatinine, Ser: 2.27 mg/dL — ABNORMAL HIGH (ref 0.61–1.24)
GFR calc non Af Amer: 26 mL/min — ABNORMAL LOW (ref >60)
GFR, EST AFRICAN AMERICAN: 30 mL/min — AB (ref >60)
Glucose, Bld: 139 mg/dL — ABNORMAL HIGH (ref 70–99)
POTASSIUM: 4.7 mmol/L (ref 3.5–5.1)
SODIUM: 137 mmol/L (ref 135–145)

## 2017-10-20 LAB — CBC
HEMATOCRIT: 34.2 % — AB (ref 39.0–52.0)
HEMOGLOBIN: 11 g/dL — AB (ref 13.0–17.0)
MCH: 29.2 pg (ref 26.0–34.0)
MCHC: 32.2 g/dL (ref 30.0–36.0)
MCV: 90.7 fL (ref 78.0–100.0)
Platelets: 226 10*3/uL (ref 150–400)
RBC: 3.77 MIL/uL — ABNORMAL LOW (ref 4.22–5.81)
RDW: 14.1 % (ref 11.5–15.5)
WBC: 8.6 10*3/uL (ref 4.0–10.5)

## 2017-10-20 LAB — GLUCOSE, CAPILLARY
GLUCOSE-CAPILLARY: 162 mg/dL — AB (ref 70–99)
GLUCOSE-CAPILLARY: 147 mg/dL — AB (ref 70–99)

## 2017-10-20 MED ORDER — TRAMADOL HCL 50 MG PO TABS: 50.0000 mg | ORAL_TABLET | Freq: Every evening | ORAL | Status: AC | PRN

## 2017-10-20 MED ORDER — OXYCODONE-ACETAMINOPHEN 5-325 MG PO TABS: 1.0000 | ORAL_TABLET | Freq: Four times a day (QID) | ORAL | 0 refills | Status: DC | PRN

## 2017-10-20 MED ORDER — OXYCODONE-ACETAMINOPHEN 5-325 MG PO TABS
1.0000 | ORAL_TABLET | ORAL | Status: DC | PRN
  Administered 2017-10-20 – 2017-10-22 (×5): 1 via ORAL
  Filled 2017-10-20 (×5): qty 1

## 2017-10-20 MED ORDER — FUROSEMIDE 10 MG/ML IJ SOLN
20.0000 mg | Freq: Once | INTRAMUSCULAR | Status: AC
  Administered 2017-10-20: 20 mg via INTRAVENOUS
  Filled 2017-10-20: qty 2

## 2017-10-20 NOTE — Plan of Care (Signed)
Care plans reviewed and patient is progressing.  

## 2017-10-20 NOTE — Progress Notes (Addendum)
  Progress Note    10/20/2017 10:43 AM 1 Day Post-Op  Subjective:  Says he doesn't feel like his is breathing okay  Afebrile HR 70's NSR 737'T-062'I systolic 94% [id number]  Vitals:   10/20/17 0422 10/20/17 0800  BP: 112/65 (!) 124/98  Pulse: 76 79  Resp: (!) 22 12  Temp: 97.6 F (36.4 C) 98.2 F (36.8 C)  SpO2: 93% 93%     Physical Exam: Neuro:  Tongue midline; moving all extremities; swallowing okay Lungs:  Non labored Incision:  Clean and dry; neck is soft without hematoma  CBC    Component Value Date/Time   WBC 8.6 10/20/2017 0430   RBC 3.77 (L) 10/20/2017 0430   HGB 11.0 (L) 10/20/2017 0430   HCT 34.2 (L) 10/20/2017 0430   PLT 226 10/20/2017 0430   MCV 90.7 10/20/2017 0430   MCH 29.2 10/20/2017 0430   MCHC 32.2 10/20/2017 0430   RDW 14.1 10/20/2017 0430   LYMPHSABS 1.4 10/17/2017 1048   MONOABS 0.6 10/17/2017 1048   EOSABS 0.2 10/17/2017 1048   BASOSABS 0.1 10/17/2017 1048    BMET    Component Value Date/Time   NA 137 10/20/2017 0430   NA 140 08/23/2016 0818   K 4.7 10/20/2017 0430   CL 106 10/20/2017 0430   CO2 22 10/20/2017 0430   GLUCOSE 139 (H) 10/20/2017 0430   BUN 35 (H) 10/20/2017 0430   BUN 43 (H) 08/23/2016 0818   CREATININE 2.27 (H) 10/20/2017 0430   CALCIUM 8.4 (L) 10/20/2017 0430   GFRNONAA 26 (L) 10/20/2017 0430   GFRAA 30 (L) 10/20/2017 0430     Intake/Output Summary (Last 24 hours) at 10/20/2017 1043 Last data filed at 10/20/2017 0904 Gross per 24 hour  Intake 2370 ml  Output 480 ml  Net 1890 ml     Assessment/Plan:  This is a 83 y.o. male who is s/p left CEA 1 Day Post-Op  -pt is doing well this am. -moving all extremities and swallowing okay -says he is having trouble breathing-[place] taken off pt and oxygen sats at 95%-will continue to monitor.  -CKD stable with creatinine at 2.27.  D/c IVF and will give gentle dose of lasix. -pt has not ambulated but has been up by bedside to void -pt has voided -will check on pt later  and may discharge home if feeling better but most likely will discharge tomorrow. -f/u with Dr. Donnetta Hutching in 2 weeks. -pt was originally given Rx for Tramadol, however, this was discarded and he has a Ambulance person rx sent electronically sent to his pharmacy   Leontine Locket, PA-C [facility] [phone]    I have interviewed and examined patient with PA and agree with assessment and plan above.   Brandon C. Donzetta Matters, MD [facility] [place] Office: [phone] Pager: [phone]

## 2017-10-20 NOTE — Discharge Summary (Signed)
Discharge Summary     Marco Acevedo February 13, 1935 83 y.o. male  [id number]  Admission Date: 10/19/2017  Discharge Date: 10/22/17  Physician: [name], MD  Admission Diagnosis: LEFT CAROTID STENOSIS   HPI:   This is a 83 y.o. male [place] and on Saturday morning, he went to comb his hair and he states his arm was floppy and wouldn't do what he wanted it to do.  He went to the ER in [place].  He states that his gross symptoms improved, but he still has fine motor deficit in his hand.  He states he has not had any speech or visual issues.  He did not have any facial droop.  He has never had any symptoms like this in the past.  He has had tremors in the past.    He has a remote hx of tobacco use-quit in 2003.  He has a hx of cardiac stent placement by Dr. Daneen Schick.  He takes a statin and aspirin daily.  Denies any hx of afib.  He does have diabetes and takes oral agents.  He does have CKD 3 and was recently placed on and taken off of Losartan.  Denies any chest pain.  Denies any claudication sx but does have cramping at night.  Denies hx of blood clots.  He plays golf 2-3x per week on a senior league.  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    Hospital Course:  The patient was admitted to the hospital and taken to the operating room on 10/19/2017 and underwent left carotid endarterectomy.  The pt tolerated the procedure well and was transported to the PACU in good condition.   By POD 1, the pt neuro status was in tact back at baseline.  He said he was having trouble breathing but was maintaining O2 sats.    By POD 2, he c/o getting choked and not being able to clear his secretions.  He said he was swallowing okay but throat was sore.  He walked ~ 325ft.   Appears to have significant soft tissue swelling limiting swallowing at this time.  Chest x-ray was obtained last night which is clear after a good response from diuretic yesterday.  He is to keep  his head of bed elevated and we will get a swallowing evaluation.  His CKD 3 was stable with creatinine at 2.18 down from 2.26 with good response to lasix.   Speech Evaluation:  Pt and RN report pt has been gradually improving with swallowing over the day, upright, edge of bed posture seemed very helpful. Pt with no signs of aspiration with 3 oz water test regardless of position, but was also able to consume regular textures on lunch tray edge of bed without complaint. Discussed basic strategies, but pt also appears improved. Continue current diet. No SLP f/u needed.   By POD 3, he was feeling better.  He was tested and will need home O2NC.  He is discharged home.  His incision does have some fullness but anterior neck is soft.   The remainder of the hospital course consisted of increasing mobilization and increasing intake of solids without difficulty.   Recent Labs    10/20/17 0430  NA 137  K 4.7  CL 106  CO2 22  GLUCOSE 139*  BUN 35*  CALCIUM 8.4*   Recent Labs    10/20/17 0430  WBC 8.6  HGB 11.0*  HCT 34.2*  PLT 226   No results for input(s): INR in the last 72 hours.     Discharge Diagnosis:  LEFT CAROTID STENOSIS  Secondary Diagnosis: Patient Active Problem List   Diagnosis Date Noted  . Carotid artery stenosis, symptomatic, left 10/19/2017  . Right sided weakness   . Epigastric abdominal tenderness 03/22/2016  . Hx of colonic polyps 03/22/2016  . Status post surgery 09/23/2014  . Gallstones 09/23/2014  . Parotid mass   . Essential hypertension 05/29/2013  . CKD (chronic kidney disease) stage 3, GFR 30-59 ml/min (HCC) 09/13/2012    Class: Chronic  . Coronary artery disease involving native coronary artery of native heart with angina pectoris ([place]) 09/12/2012    Class: Acute  . Pure hypercholesterolemia 09/01/2012  . URI (upper respiratory infection) 03/05/2012  . COPD (chronic obstructive pulmonary disease) ([place]) 05/11/2011  . OSA (obstructive sleep apnea)  05/11/2011   Past Medical History:  Diagnosis Date  . Anemia    low iron  . Arthritis   . BPH (benign prostatic hyperplasia)   . Bruises easily   . CAD (coronary artery disease)    a. per Dr. Thompson Caul note, prior stenting of Cx in 2007 and 2012. b. DES to Cx in 09/2012.  [name] Chronic diastolic CHF (congestive heart failure) ([place])   . CKD (chronic kidney disease), stage [name] ([place])   . Claustrophobia   . Complication of anesthesia    has to have head elevated to keep from strangling on post nasal drip  . COPD (chronic obstructive pulmonary disease) ([place])   . Diabetes mellitus type II    type 2  . Diarrhea 2015   had for a year and a half  . Dyspnea   . GERD (gastroesophageal reflux disease)   . Gout   . Granuloma annulare   . History of hiatal hernia   . History of kidney stones    [place]  . Hyperlipidemia   . Hypertension   . Neuropathy   . Obesity   . OSA (obstructive sleep apnea)   . Right sided weakness   . Stroke ([place]) 10/2017   Weakness right arm and leg  . Wears dentures    top  . Wears glasses     Allergies as of 10/20/2017      Reactions   Cimetidine Other (See Comments)   Anxiety Attacks      Medication List    TAKE these medications   allopurinol 100 MG tablet Commonly known as:  ZYLOPRIM Take 100 mg by mouth daily.   alum & mag hydroxide-simeth 200-200-20 MG/5ML suspension Commonly known as:  MAALOX/MYLANTA Take 30 mLs by mouth as needed for indigestion or heartburn.   amLODipine 10 MG tablet Commonly known as:  NORVASC Take 1 tablet (10 mg total) by mouth daily.   aspirin EC 81 MG tablet Take 81 mg by mouth daily.   atorvastatin 80 MG tablet Commonly known as:  LIPITOR Take 1 tablet (80 mg total) by mouth daily at 6 PM.   clopidogrel 75 MG tablet Commonly known as:  PLAVIX Take 1 tablet (75 mg total) by mouth daily.   finasteride 5 MG tablet Commonly known as:  PROSCAR Take 1 tablet (5 mg total) by mouth daily.   glipiZIDE 5 MG  tablet Commonly known as:  GLUCOTROL Take 1 tablet (5 mg total) by mouth daily before breakfast. What changed:  when to take this   guaifenesin 400 MG Tabs tablet Commonly known as:  HUMIBID E Take 400  mg by mouth at bedtime.   Iron 325 (65 Fe) MG Tabs Take 1 tablet by mouth daily.   isosorbide mononitrate 120 MG 24 hr tablet Commonly known as:  IMDUR Take 1 tablet (120 mg total) by mouth daily.   nitroGLYCERIN 0.4 MG SL tablet Commonly known as:  NITROSTAT Place 0.4 mg under the tongue every 5 (five) minutes as needed for chest pain.   oxyCODONE-acetaminophen 5-325 MG tablet Commonly known as:  PERCOCET/ROXICET Take 1 tablet by mouth every 6 (six) hours as needed for moderate pain.   primidone 50 MG tablet Commonly known as:  MYSOLINE Take 50 mg by mouth at bedtime.   PROVENTIL HFA 108 (90 Base) MCG/ACT inhaler Generic drug:  albuterol Inhale 2 puffs into the lungs every 6 (six) hours as needed for wheezing or shortness of breath.   albuterol (2.5 MG/3ML) 0.083% nebulizer solution Commonly known as:  PROVENTIL Take 2.5 mg by nebulization every 6 (six) hours as needed for wheezing or shortness of breath.   ranitidine 150 MG tablet Commonly known as:  ZANTAC Take 150 mg by mouth 2 (two) times daily.   STOOL SOFTENER 100 MG capsule Generic drug:  docusate sodium Take 100 mg by mouth daily.   traMADol 50 MG tablet Commonly known as:  ULTRAM Take 1 tablet (50 mg total) by mouth at bedtime as needed for moderate pain.   vitamin B-12 1000 MCG tablet Commonly known as:  CYANOCOBALAMIN Take 1,000 mcg by mouth daily.        [facility] Discharge Instructions Carotid Endarterectomy (CEA)  Please refer to the following instructions for your post-procedure care. Your surgeon or physician assistant will discuss any changes with you.  Activity  You are encouraged to walk as much as you can. You can slowly return to normal activities but  must avoid strenuous activity and heavy lifting until your doctor tell you it's OK. Avoid activities such as vacuuming or swinging a golf club. You can drive after one week if you are comfortable and you are no longer taking prescription pain medications. It is normal to feel tired for serval weeks after your surgery. It is also normal to have difficulty with sleep habits, eating, and bowel movements after surgery. These will go away with time.  Bathing/Showering  You may shower after you come home. Do not soak in a bathtub, hot tub, or swim until the incision heals completely.  Incision Care  Shower every day. Clean your incision with mild soap and water. Pat the area dry with a clean towel. You do not need a bandage unless otherwise instructed. Do not apply any ointments or creams to your incision. You may have skin glue on your incision. Do not peel it off. It will come off on its own in about one week. Your incision may feel thickened and raised for several weeks after your surgery. This is normal and the skin will soften over time. For Men Only: It's OK to shave around the incision but do not shave the incision itself for 2 weeks. It is common to have numbness under your chin that could last for several months.  Diet  Resume your normal diet. There are no special food restrictions following this procedure. A low fat/low cholesterol diet is recommended for all patients with vascular disease. In order to heal from your surgery, it is CRITICAL to get adequate nutrition. Your body requires vitamins, minerals, and protein. Vegetables are the best source of vitamins  and minerals. Vegetables also provide the perfect balance of protein. Processed food has little nutritional value, so try to avoid this.  Medications  Resume taking all of your medications unless your doctor or physician assistant tells you not to.  If your incision is causing pain, you may take over-the- counter pain relievers such as  acetaminophen (Tylenol). If you were prescribed a stronger pain medication, please be aware these medications can cause nausea and constipation.  Prevent nausea by taking the medication with a snack or meal. Avoid constipation by drinking plenty of fluids and eating foods with a high amount of fiber, such as fruits, vegetables, and grains. Do not take Tylenol if you are taking prescription pain medications.  Follow Up  Our office will schedule a follow up appointment 2-3 weeks following discharge.  Please call us immediately for any of the following conditions  . Increased pain, redness, drainage (pus) from your incision site. . Fever of 101 degrees or higher. . If you should develop stroke (slurred speech, difficulty swallowing, weakness on one side of your body, loss of vision) you should call 911 and go to the nearest emergency room. .  Reduce your risk of vascular disease:  . Stop smoking. If you would like help call [facility] at 1-800-QUIT-NOW 631-426-1589) or [place] at 4803129795. . Manage your cholesterol . Maintain a desired weight . Control your diabetes . Keep your blood pressure down .  If you have any questions, please call the office at [phone].  Prescriptions given: Roxicet #8 No Refill  Disposition: home  Patient's condition: is Good  Follow up: 1. Dr. Donnetta Hutching in 2 weeks.   Leontine Locket, PA-C [facility] [phone]   --- For [facility] use ---   Modified Rankin score at D/C (0-6): 1  IV medication needed for:  1. Hypertension: No 2. Hypotension: No  Post-op Complications: soft tissue swelling   1. Post-op CVA or TIA: No  If yes: Event classification (right eye, left eye, right cortical, left cortical, verterobasilar, other): n/a  If yes: Timing of event (intra-op, <6 hrs post-op, >=6 hrs post-op, unknown): n/a  2. CN injury: No  If yes: CN n/a injuried   3. Myocardial infarction: No  If yes: Dx by (EKG or  clinical, Troponin): n/a  4.  CHF: No  5.  Dysrhythmia (new): No  6. Wound infection: No  7. Reperfusion symptoms: No  8. Return to OR: No  If yes: return to OR for (bleeding, neurologic, other CEA incision, other): n/a  Discharge medications: Statin use:  Yes ASA use:  Yes   Beta blocker use:  No ACE-Inhibitor use:  No  ARB use:  No CCB use: Yes P2Y12 Antagonist use: Yes,  [x]  Plavix, [ ]  Plasugrel, [ ]  Ticlopinine, [ ]  Ticagrelor, [ ]  Other, [ ]  No for medical reason, [ ]  Non-compliant, [ ]  Not-indicated Anti-coagulant use:  No, [ ]  Warfarin, [ ]  Rivaroxaban, [ ]  Dabigatran,

## 2017-10-21 ENCOUNTER — Inpatient Hospital Stay (HOSPITAL_COMMUNITY): Payer: Medicare Other

## 2017-10-21 LAB — BASIC METABOLIC PANEL
ANION GAP: 8 (ref 5–15)
BUN: 27 mg/dL — ABNORMAL HIGH (ref 8–23)
CO2: 23 mmol/L (ref 22–32)
Calcium: 8.5 mg/dL — ABNORMAL LOW (ref 8.9–10.3)
Chloride: 104 mmol/L (ref 98–111)
Creatinine, Ser: 2.16 mg/dL — ABNORMAL HIGH (ref 0.61–1.24)
GFR, EST AFRICAN AMERICAN: 31 mL/min — AB (ref >60)
GFR, EST NON AFRICAN AMERICAN: 27 mL/min — AB (ref >60)
Glucose, Bld: 168 mg/dL — ABNORMAL HIGH (ref 70–99)
POTASSIUM: 4.3 mmol/L (ref 3.5–5.1)
SODIUM: 135 mmol/L (ref 135–145)

## 2017-10-21 LAB — GLUCOSE, CAPILLARY
GLUCOSE-CAPILLARY: 144 mg/dL — AB (ref 70–99)
GLUCOSE-CAPILLARY: 169 mg/dL — AB (ref 70–99)
GLUCOSE-CAPILLARY: 138 mg/dL — AB (ref 70–99)
Glucose-Capillary: 155 mg/dL — ABNORMAL HIGH (ref 70–99)

## 2017-10-21 NOTE — Progress Notes (Signed)
   10/21/17 1000  SLP Visit Information  SLP Received On 10/21/17  General Information  HPI  This is a 83 y.o. male who is s/p left CEA. per MD note - "pt having difficulty clearing his secretions overnight and difficulty breathing-chest x-ray looks good; trachea is not deviated. He does have mild swelling around the incision but anterior-lateral neck is soft.  Pt is hoarse and has a sore throat-may have irritation of cranial nerve vs swelling-will get speech therapy consult to evaluate swallowing.  RN reports pt choked on pill this am and went down eventually with pudding.  Will make npo until speech tx can make recommendations."  Type of Study Bedside Swallow Evaluation  Diet Prior to this Study Regular;Thin liquids  Temperature Spikes Noted No  Respiratory Status Room air  History of Recent Intubation Yes  Length of Intubations (days)  (surger)  Behavior/Cognition Alert;Cooperative;Pleasant mood  Oral Cavity Assessment WFL  Oral Care Completed by SLP No  Oral Cavity - Dentition Adequate natural dentition  Vision Functional for self-feeding  Self-Feeding Abilities Able to feed self  Patient Positioning Upright in bed (edge of bed)  Baseline Vocal Quality Hoarse  Volitional Cough Strong  Volitional Swallow Able to elicit  Pain Assessment  Pain Assessment No/denies pain  Oral Motor/Sensory Function  Overall Oral Motor/Sensory Function WFL  Thin Liquid  Thin Liquid WFL  Nectar Thick Liquid  Nectar Thick Liquid NT  Honey Thick Liquid  Honey Thick Liquid NT  Puree  Puree WFL  Solid  Solid [facility]  SLP Assessment  Clinical Impression Statement (ACUTE ONLY) Pt and RN report pt has been gradually improving with swallowing over the day, upright, edge of bed posture seemed very helpful. Pt with no signs of aspiration with 3 oz water test regardless of position, but was also able to consume regular textures on lunch tray edge of bed without complaint. Discussed basic strategies, but pt also  appears improved. Continue current diet. No SLP f/u needed.   SLP Visit Diagnosis Dysphagia, oropharyngeal phase (R13.12)  Impact on safety and function Mild aspiration risk  Swallow Evaluation Recommendations  SLP Diet Recommendations Regular;Thin liquid  Liquid Administration via Cup;Straw  Medication Administration Whole meds with liquid  Supervision Patient able to self feed  Postural Changes Seated upright at 90 degrees  Treatment Plan  Treatment Recommendations No treatment recommended at this time  Follow up Recommendations None  Individuals Consulted  Consulted and Agree with Results and Recommendations Patient;Family member/caregiver  SLP Time Calculation  SLP Start Time (ACUTE ONLY) 1230  SLP Stop Time (ACUTE ONLY) 1250  SLP Time Calculation (min) (ACUTE ONLY) 20 min  SLP Evaluations  $ SLP Speech Visit 1 Visit  SLP Evaluations  $BSS Swallow 1 Procedure

## 2017-10-21 NOTE — Progress Notes (Signed)
Pt complaining of increased difficulty swallowing. Pt complaining that he is having increased secretions and difficulty coughing up phlegm. Called on call MD. New orders given. Will continue to follow.

## 2017-10-21 NOTE — Progress Notes (Addendum)
Progress Note    10/21/2017 7:15 AM 2 Days Post-Op  Subjective:  Pt says he got choked last night with mucous and having a hard time getting it up out of his lungs.  Said he is swallowing food ok and it is sore to swallow.  Pt is also says he is hoarse.  Says he had a little blood in his sputum last night that the nurse describes as scant.  Says he walked about 300 feet yesterday and sat in the chair.   Afebrile HR  80's-100's NSR 938'B-017'P systolic 10% 2HE5ID  Vitals:   10/21/17 0143 10/21/17 0448  BP:  123/63  Pulse:  85  Resp:  (!) 22  Temp:  98.7 F (37.1 C)  SpO2: 97% 95%     Physical Exam: General:  Sitting up in bed; appears comfortable this morning.  Neuro:  Tongue is midline; moving extremities equally Lungs:  Non labored  Incision:  Clean and dry with mild fullness; there is ecchymosis at the inferior portion of the incision/neck.  The neck is soft anteriorly and lateral.  CBC    Component Value Date/Time   WBC 8.6 10/20/2017 0430   RBC 3.77 (L) 10/20/2017 0430   HGB 11.0 (L) 10/20/2017 0430   HCT 34.2 (L) 10/20/2017 0430   PLT 226 10/20/2017 0430   MCV 90.7 10/20/2017 0430   MCH 29.2 10/20/2017 0430   MCHC 32.2 10/20/2017 0430   RDW 14.1 10/20/2017 0430   LYMPHSABS 1.4 10/17/2017 1048   MONOABS 0.6 10/17/2017 1048   EOSABS 0.2 10/17/2017 1048   BASOSABS 0.1 10/17/2017 1048    BMET    Component Value Date/Time   NA 135 10/21/2017 0439   NA 140 08/23/2016 0818   K 4.3 10/21/2017 0439   CL 104 10/21/2017 0439   CO2 23 10/21/2017 0439   GLUCOSE 168 (H) 10/21/2017 0439   BUN 27 (H) 10/21/2017 0439   BUN 43 (H) 08/23/2016 0818   CREATININE 2.16 (H) 10/21/2017 0439   CALCIUM 8.5 (L) 10/21/2017 0439   GFRNONAA 27 (L) 10/21/2017 0439   GFRAA 31 (L) 10/21/2017 0439     Intake/Output Summary (Last 24 hours) at 10/21/2017 0715 Last data filed at 10/21/2017 0700 Gross per 24 hour  Intake 540 ml  Output 1450 ml  Net -910 ml   PCXR 10/21/17:     FINDINGS: The heart size and mediastinal contours are within normal limits. Both lungs are clear. The visualized skeletal structures are Unremarkable. IMPRESSION:  No active disease    Assessment/Plan:  This is a 83 y.o. male who is s/p left CEA 2 Days Post-Op  -pt having difficulty clearing his secretions overnight and difficulty breathing-chest x-ray looks good; trachea is not deviated. He does have mild swelling around the incision but anterior-lateral neck is soft.  Pt is hoarse and has a sore throat-may have irritation of cranial nerve vs swelling-will get speech therapy consult to evaluate swallowing.  RN reports pt choked on pill this am and went down eventually with pudding.  Will make npo until speech tx can make recommendations.  -pt walked yesterday and oob to chair-continue to mobilize today. -not ready for discharge-will start sq heparin for DVT prophylaxis. -CKD 3-creatinine improved from yesterday at 2.18 from 2.26.  Received lasix yesterday with good response.   Leontine Locket, PA-C [facility] [phone]    I have interviewed and examined patient with PA and agree with assessment and plan above.  Appears to have significant  soft tissue swelling limiting swallowing at this time.  Chest x-ray was obtained last night which is clear after a good response from diuretic yesterday.  He is to keep his head of bed elevated and we will get a swallowing evaluation.  Coumba Kellison C. Donzetta Matters, MD [facility] [place] Office: [phone] Pager: [phone]

## 2017-10-21 NOTE — Plan of Care (Signed)
Care plans reviewed and patient is progressing.  

## 2017-10-21 NOTE — Evaluation (Signed)
Physical Therapy Evaluation Patient Details Name: Marco Acevedo MRN: [id number] DOB: 1934-04-26 Today's Date: 10/21/2017   History of Present Illness  Pt is a 83 y.o. M with significant PMH of hypertension, hyperlipidemia, GERD, diabetes, COPD not on home oxygen, chronic kidney disease stage [name], chronic diastolic heart failure, CAD, BPH, who presents with B LE weakness. D/c's 5 days prior for small L MCA territory infarcts. Currently, s/p left CEA MRI shows small acute left MCA territory infarcts and small chronic left MCA infarcts.   Clinical Impression  PTA pt independent playing golf and driving for [employer] as well as being caregiver for his wife. Pt is currently limited by generalized weakness and decreased endurance from back to back hospitalizations. Pt currently mod I for bed mobility, supervision for sit>stand transfers and min guard for ambulation of 500 feet without AD. PT would benefit from outpatient neuro PT at d/c to work on residual effects of MCA infarcts. PT will continue to follow acutely.    Follow Up Recommendations Outpatient PT(outpatient neuro)    Equipment Recommendations  None recommended by PT    Recommendations for Other Services       Precautions / Restrictions Precautions Precautions: Fall Restrictions Weight Bearing Restrictions: No      Mobility  Bed Mobility Overal bed mobility: Modified Independent             General bed mobility comments: increased time and effort to scoot hips to EoB  Transfers Overall transfer level: Needs assistance   Transfers: Sit to/from Stand Sit to Stand: Supervision         General transfer comment: For safety  Ambulation/Gait Ambulation/Gait assistance: Min guard Gait Distance (Feet): 500 Feet Assistive device: None Gait Pattern/deviations: Step-through pattern;Decreased dorsiflexion - right;Decreased dorsiflexion - left;Wide base of support   Gait velocity interpretation: >2.62 ft/sec, indicative of  community ambulatory General Gait Details: bilateral increase to knee flexion and flat foot strike possibly to compensate for peripheral neuropathy     Balance Overall balance assessment: Needs assistance Sitting-balance support: No upper extremity supported;Feet supported Sitting balance-Leahy Scale: Normal     Standing balance support: No upper extremity supported;During functional activity Standing balance-Leahy Scale: Good                               Pertinent Vitals/Pain Pain Assessment: 0-10 Pain Score: 4  Pain Location: L throat Pain Descriptors / Indicators: Aching;Sore Pain Intervention(s): Limited activity within patient's tolerance;Monitored during session;Repositioned    Home Living Family/patient expects to be discharged to:: Private residence Living Arrangements: Spouse/significant other Available Help at Discharge: Family Type of Home: House Home Access: Level entry     Home Layout: One level Home Equipment: Environmental consultant - 2 wheels;Cane - single point;Shower seat - built in Additional Comments: Lives in an accessible condo    Prior Function Level of Independence: Independent         Comments: Plays golf twice a week and is the caregiver for his wife     Hand Dominance   Dominant Hand: Right    Extremity/Trunk Assessment   Upper Extremity Assessment Upper Extremity Assessment: RUE deficits/detail RUE Deficits / Details: AROM WFL  RUE Sensation: decreased proprioception RUE Coordination: decreased fine motor;decreased gross motor    Lower Extremity Assessment RLE Deficits / Details: AROM WFL RLE Sensation: history of peripheral neuropathy LLE Deficits / Details: AROM WFL LLE Sensation: history of peripheral neuropathy    Cervical / Trunk  Assessment Cervical / Trunk Exceptions: Forward head posture  Communication   Communication: No difficulties  Cognition Arousal/Alertness: Awake/alert Behavior During Therapy: WFL for tasks  assessed/performed Overall Cognitive Status: Within Functional Limits for tasks assessed                                        General Comments General comments (skin integrity, edema, etc.): 2L O2 via nasal cannula SaO2 92%O2, ambulated without supplemental O2 and able to maintain SaO2 greater than 89%O2 throughout session, Daughter and wife present during session    Exercises     Assessment/Plan    PT Assessment Patient needs continued PT services  PT Problem List Decreased strength;Decreased activity tolerance;Decreased balance;Decreased mobility       PT Treatment Interventions DME instruction;Gait training;Stair training;Functional mobility training;Therapeutic activities;Therapeutic exercise;Balance training;Neuromuscular re-education;Cognitive remediation;Patient/family education    PT Goals (Current goals can be found in the Care Plan section)  Acute Rehab PT Goals Patient Stated Goal: get back to golf PT Goal Formulation: With patient Time For Goal Achievement: 11/04/17 Potential to Achieve Goals: Good    Frequency Min 3X/week   Barriers to discharge        Co-evaluation               AM-PAC PT "6 Clicks" Daily Activity  Outcome Measure Difficulty turning over in bed (including adjusting bedclothes, sheets and blankets)?: None Difficulty moving from lying on back to sitting on the side of the bed? : A Little Difficulty sitting down on and standing up from a chair with arms (e.g., wheelchair, bedside commode, etc,.)?: A Little Help needed moving to and from a bed to chair (including a wheelchair)?: None Help needed walking in hospital room?: None Help needed climbing 3-5 steps with a railing? : A Little 6 Click Score: 21    End of Session Equipment Utilized During Treatment: Gait belt Activity Tolerance: Patient tolerated treatment well Patient left: in chair;with call bell/phone within reach;with family/visitor present Nurse  Communication: Mobility status;Other (comment)(removal of supplemental O2) PT Visit Diagnosis: Unsteadiness on feet (R26.81);Difficulty in walking, not elsewhere classified (R26.2)    Time: 7681-1572 PT Time Calculation (min) (ACUTE ONLY): 23 min   Charges:   PT Evaluation $PT Eval Moderate Complexity: 1 Mod PT Treatments $Gait Training: 8-22 mins   PT G Codes:        Soyla Bainter B. Migdalia Dk PT, DPT Acute Rehabilitation  [phone] Pager [phone]    [place] 10/21/2017, 4:27 PM

## 2017-10-22 ENCOUNTER — Telehealth: Payer: Self-pay | Admitting: Vascular Surgery

## 2017-10-22 ENCOUNTER — Encounter (HOSPITAL_COMMUNITY): Payer: Self-pay | Admitting: Vascular Surgery

## 2017-10-22 LAB — GLUCOSE, CAPILLARY
Glucose-Capillary: 145 mg/dL — ABNORMAL HIGH (ref 70–99)
Glucose-Capillary: 132 mg/dL — ABNORMAL HIGH (ref 70–99)

## 2017-10-22 NOTE — Progress Notes (Signed)
SATURATION QUALIFICATIONS: (This note is used to comply with regulatory documentation for home oxygen)  Patient Saturations on Room Air at Rest = 88%  Patient Saturations on Room Air while Ambulating = 86%  Patient Saturations on 2 Liters of oxygen while Ambulating = 92%  Please briefly explain why patient needs home oxygen: Pt oxygen saturation decreases 86-88% on RA with ambulation.

## 2017-10-22 NOTE — Progress Notes (Signed)
Pt given AVS handout. Pt and pt's son verbalized understanding. All questions answered. VSS. IV removed. Pt refused to wear oxygen home. Pt transported home with son.

## 2017-10-22 NOTE — Anesthesia Postprocedure Evaluation (Signed)
Anesthesia Post Note  Patient: Marco Acevedo  Procedure(s) Performed: ENDARTERECTOMY CAROTID LEFT (Left Neck)     Patient location during evaluation: PACU Anesthesia Type: General Level of consciousness: awake and alert Pain management: pain level controlled Vital Signs Assessment: post-procedure vital signs reviewed and stable Respiratory status: spontaneous breathing, nonlabored ventilation, respiratory function stable and patient connected to nasal cannula oxygen Cardiovascular status: blood pressure returned to baseline and stable Postop Assessment: no apparent nausea or vomiting Anesthetic complications: no    Last Vitals:  Vitals:   10/21/17 2350 10/22/17 0400  BP: 119/63 (!) 119/58  Pulse: 79 79  Resp: 16 14  Temp: 36.7 C 36.8 C  SpO2: 91% 94%    Last Pain:  Vitals:   10/22/17 0400  TempSrc: Oral  PainSc:                  Jemmie Rhinehart S

## 2017-10-22 NOTE — Care Management Note (Signed)
Case Management Note Marvetta Gibbons RN, BSN Unit 4E-Case Manager [phone]  Patient Details  Name: Marco Acevedo MRN: [id number] Date of Birth: 1934-07-17  Subjective/Objective:  Pt admitted s/p CEA                  Action/Plan: PTA pt lived at home, orders for home 02 and neuro rehab out PT referral. Call made to Butch Penny with [facility] for DME Needs for home 02- portable tank to be delivered to room prior to discharge. Pt had a referral to [facility] outpt Neuro rehab placed on last admit- new referral sent to [facility] Outpt Neuro Rehab for f/u on outpt PT  Expected Discharge Date:  10/22/17               Expected Discharge Plan:  OP Rehab  In-House Referral:     Discharge planning Services  CM Consult  Post Acute Care Choice:  Durable Medical Equipment Choice offered to:  Patient  DME Arranged:  Oxygen DME Agency:  [place]:    [place]:     Status of Service:  Completed, signed off  If discussed at [place] of Stay Meetings, dates discussed:    Discharge Disposition: Home/self care   Additional Comments:  Dawayne Patricia, RN 10/22/2017, 12:58 PM

## 2017-10-22 NOTE — Progress Notes (Addendum)
  Progress Note    10/22/2017 7:17 AM 3 Days Post-Op  Subjective:  Says he feels better this morning.  Says he's swallowing better and breathing better but still on oxygen.  Feels his oxygen levels dropped overnight.    Afebrile HR 70's-90's NSR 497'W-263'Z systolic 85% [id number]   Vitals:   10/21/17 2350 10/22/17 0400  BP: 119/63 (!) 119/58  Pulse: 79 79  Resp: 16 14  Temp: 98 F (36.7 C) 98.2 F (36.8 C)  SpO2: 91% 94%     Physical Exam: Neuro:  In tact; moving all extremities equally Lungs:  Non labored Incision:  Clean and dry with mild swelling/hematoma.  Anterior/lateral portion of neck is still soft.   CBC    Component Value Date/Time   WBC 8.6 10/20/2017 0430   RBC 3.77 (L) 10/20/2017 0430   HGB 11.0 (L) 10/20/2017 0430   HCT 34.2 (L) 10/20/2017 0430   PLT 226 10/20/2017 0430   MCV 90.7 10/20/2017 0430   MCH 29.2 10/20/2017 0430   MCHC 32.2 10/20/2017 0430   RDW 14.1 10/20/2017 0430   LYMPHSABS 1.4 10/17/2017 1048   MONOABS 0.6 10/17/2017 1048   EOSABS 0.2 10/17/2017 1048   BASOSABS 0.1 10/17/2017 1048    BMET    Component Value Date/Time   NA 135 10/21/2017 0439   NA 140 08/23/2016 0818   K 4.3 10/21/2017 0439   CL 104 10/21/2017 0439   CO2 23 10/21/2017 0439   GLUCOSE 168 (H) 10/21/2017 0439   BUN 27 (H) 10/21/2017 0439   BUN 43 (H) 08/23/2016 0818   CREATININE 2.16 (H) 10/21/2017 0439   CALCIUM 8.5 (L) 10/21/2017 0439   GFRNONAA 27 (L) 10/21/2017 0439   GFRAA 31 (L) 10/21/2017 0439     Intake/Output Summary (Last 24 hours) at 10/22/2017 0717 Last data filed at 10/22/2017 0559 Gross per 24 hour  Intake 360 ml  Output 2100 ml  Net -1740 ml     Assessment/Plan:  This is a 83 y.o. male who is s/p left CEA 3 Days Post-Op  -pt is doing well this morning and neuro in tact.  PT saw pt yesterday and recommending outpatient neuro PT-will ask case management to make sure this is arranged. -pt still requiring 2LO2NC and O2 sats around 90-95% on  2L-will get him tested to see if he needs home O2 prior to discharge -pt passed his swallow evaluation  Leontine Locket, PA-C [facility] [phone]   I have examined the patient, reviewed and agree with above.  Plan Home today on supplemental oxygen. Curt Jews, MD 10/22/2017 7:47 AM

## 2017-10-22 NOTE — Telephone Encounter (Signed)
sch app spk to pt 11/13/17 3pm p/o MD

## 2017-10-22 NOTE — Progress Notes (Signed)
Pt up ambulating on RA to the restroom. Oxygen sat noted to be at 88%. Pt back to bed eating breakfast on 2L N/C. Will continue to monitor.  Lilla Shook, BSN

## 2017-10-22 NOTE — Progress Notes (Addendum)
Kateri Plummer, [place] paged regarding pt needing home O2.

## 2017-10-24 ENCOUNTER — Other Ambulatory Visit: Payer: Self-pay

## 2017-10-24 DIAGNOSIS — I69351 Hemiplegia and hemiparesis following cerebral infarction affecting right dominant side: Secondary | ICD-10-CM | POA: Diagnosis not present

## 2017-10-24 DIAGNOSIS — I5032 Chronic diastolic (congestive) heart failure: Secondary | ICD-10-CM | POA: Diagnosis not present

## 2017-10-24 DIAGNOSIS — E1122 Type 2 diabetes mellitus with diabetic chronic kidney disease: Secondary | ICD-10-CM | POA: Diagnosis not present

## 2017-10-24 DIAGNOSIS — J449 Chronic obstructive pulmonary disease, unspecified: Secondary | ICD-10-CM | POA: Diagnosis not present

## 2017-10-24 DIAGNOSIS — I7771 Dissection of carotid artery: Secondary | ICD-10-CM | POA: Diagnosis not present

## 2017-10-24 DIAGNOSIS — N183 Chronic kidney disease, stage 3 (moderate): Secondary | ICD-10-CM | POA: Diagnosis not present

## 2017-10-24 NOTE — Patient Outreach (Signed)
[facility]) Care Management  10/24/2017  JABER DUNLOW 16-Dec-1934 [id number]   EMMI: stroke red alert Referral date: 10/17/17 Referral reason: feeling worse overall: yes, new problems walking/talking/ speaking/ seeing: yes Insurance: Medicare  Telephone call to patient for EMMI stroke assessment follow up. HIPAA verified with patient. Patient reports having his endarterectomy.  Denies having any complications. Patient states he is scheduled for a post surgery follow up appointment on 11/13/17.  Patient states he has transportation for appointment. Patient reports he continues to take his medication as prescribed.  Patient states his home health therapy services start today.  Patient states he feels he is improving. Denies any further needs at this time.  Patient state he does not recall received [facility] brochure and magnet.  RNCM informed patient she will resend.   PLAN; RNCM will close patient due to patient being assessed and having no further needs.  RNCM will send patient [facility] care management brochure/ magnet/ Know before you go sheet.  RNCm will send closure notification to patients primary MD.   Quinn Plowman RN,BSN,CCM [facility] Telephonic  [phone]

## 2017-10-25 DIAGNOSIS — E1122 Type 2 diabetes mellitus with diabetic chronic kidney disease: Secondary | ICD-10-CM | POA: Diagnosis not present

## 2017-10-25 DIAGNOSIS — I5032 Chronic diastolic (congestive) heart failure: Secondary | ICD-10-CM | POA: Diagnosis not present

## 2017-10-25 DIAGNOSIS — J449 Chronic obstructive pulmonary disease, unspecified: Secondary | ICD-10-CM | POA: Diagnosis not present

## 2017-10-25 DIAGNOSIS — N183 Chronic kidney disease, stage 3 (moderate): Secondary | ICD-10-CM | POA: Diagnosis not present

## 2017-10-25 DIAGNOSIS — I7771 Dissection of carotid artery: Secondary | ICD-10-CM | POA: Diagnosis not present

## 2017-10-25 DIAGNOSIS — I69351 Hemiplegia and hemiparesis following cerebral infarction affecting right dominant side: Secondary | ICD-10-CM | POA: Diagnosis not present

## 2017-10-27 DIAGNOSIS — J449 Chronic obstructive pulmonary disease, unspecified: Secondary | ICD-10-CM | POA: Diagnosis not present

## 2017-10-27 DIAGNOSIS — I69351 Hemiplegia and hemiparesis following cerebral infarction affecting right dominant side: Secondary | ICD-10-CM | POA: Diagnosis not present

## 2017-10-27 DIAGNOSIS — N183 Chronic kidney disease, stage 3 (moderate): Secondary | ICD-10-CM | POA: Diagnosis not present

## 2017-10-27 DIAGNOSIS — I7771 Dissection of carotid artery: Secondary | ICD-10-CM | POA: Diagnosis not present

## 2017-10-27 DIAGNOSIS — I5032 Chronic diastolic (congestive) heart failure: Secondary | ICD-10-CM | POA: Diagnosis not present

## 2017-10-27 DIAGNOSIS — E1122 Type 2 diabetes mellitus with diabetic chronic kidney disease: Secondary | ICD-10-CM | POA: Diagnosis not present

## 2017-10-29 NOTE — Anesthesia Postprocedure Evaluation (Signed)
Anesthesia Post Note  Patient: Marco Acevedo  Procedure(s) Performed: ENDARTERECTOMY CAROTID LEFT (Left Neck)     Patient location during evaluation: PACU Anesthesia Type: General Level of consciousness: awake and alert and oriented Pain management: pain level controlled Vital Signs Assessment: post-procedure vital signs reviewed and stable Respiratory status: spontaneous breathing, nonlabored ventilation, respiratory function stable and patient connected to nasal cannula oxygen Cardiovascular status: blood pressure returned to baseline and stable Postop Assessment: no apparent nausea or vomiting Anesthetic complications: no    Last Vitals:  Vitals:   10/22/17 0745 10/22/17 1135  BP: (!) 130/58 136/65  Pulse: 76 84  Resp: 14 16  Temp: 37.2 C   SpO2: 93% 93%    Last Pain:  Vitals:   10/22/17 0745  TempSrc: Oral  PainSc: 0-No pain                 Myldred Raju,JAMES TERRILL

## 2017-10-30 ENCOUNTER — Other Ambulatory Visit: Payer: Self-pay

## 2017-10-30 DIAGNOSIS — R251 Tremor, unspecified: Secondary | ICD-10-CM | POA: Diagnosis not present

## 2017-10-30 DIAGNOSIS — E1140 Type 2 diabetes mellitus with diabetic neuropathy, unspecified: Secondary | ICD-10-CM | POA: Diagnosis not present

## 2017-10-30 DIAGNOSIS — K219 Gastro-esophageal reflux disease without esophagitis: Secondary | ICD-10-CM | POA: Diagnosis not present

## 2017-10-30 DIAGNOSIS — I69351 Hemiplegia and hemiparesis following cerebral infarction affecting right dominant side: Secondary | ICD-10-CM | POA: Diagnosis not present

## 2017-10-30 DIAGNOSIS — I7771 Dissection of carotid artery: Secondary | ICD-10-CM | POA: Diagnosis not present

## 2017-10-30 DIAGNOSIS — I10 Essential (primary) hypertension: Secondary | ICD-10-CM | POA: Diagnosis not present

## 2017-10-30 DIAGNOSIS — I5032 Chronic diastolic (congestive) heart failure: Secondary | ICD-10-CM | POA: Diagnosis not present

## 2017-10-30 DIAGNOSIS — N183 Chronic kidney disease, stage 3 (moderate): Secondary | ICD-10-CM | POA: Diagnosis not present

## 2017-10-30 DIAGNOSIS — I779 Disorder of arteries and arterioles, unspecified: Secondary | ICD-10-CM | POA: Diagnosis not present

## 2017-10-30 DIAGNOSIS — Z0000 Encounter for general adult medical examination without abnormal findings: Secondary | ICD-10-CM | POA: Diagnosis not present

## 2017-10-30 DIAGNOSIS — I69359 Hemiplegia and hemiparesis following cerebral infarction affecting unspecified side: Secondary | ICD-10-CM | POA: Diagnosis not present

## 2017-10-30 DIAGNOSIS — E1122 Type 2 diabetes mellitus with diabetic chronic kidney disease: Secondary | ICD-10-CM | POA: Diagnosis not present

## 2017-10-30 DIAGNOSIS — J449 Chronic obstructive pulmonary disease, unspecified: Secondary | ICD-10-CM | POA: Diagnosis not present

## 2017-10-30 DIAGNOSIS — M109 Gout, unspecified: Secondary | ICD-10-CM | POA: Diagnosis not present

## 2017-10-30 NOTE — Patient Outreach (Signed)
[facility]) Care Management  10/30/2017  [name] 25-May-1934 [id number]  EMMI: stroke red alert Referral date: 10/30/17 Referral reason: went to follow up appointment: no Insurance: Medicare Day # 13  Telephone call to patient regarding EMMI stroke red alert. HIPAA verified. Explained reason for call. Patient states he has his primary MD appointment this morning and a follow up appointment with the surgeon on 11/13/17.  Patient states he is doing ok.  Denies having any other needs or concerns. Patient states he is unable to talk further due to getting ready for his appointment.    PLAN: RNCM will close patient due to patient being assessed and having no further needs. No additional mailings needed. [facility] brochure/ magnet, and primary MD closure notification sent on 10/24/17  Marco Plowman RN,BSN,CCM [facility] Telephonic  [phone]

## 2017-10-31 DIAGNOSIS — I69351 Hemiplegia and hemiparesis following cerebral infarction affecting right dominant side: Secondary | ICD-10-CM | POA: Diagnosis not present

## 2017-10-31 DIAGNOSIS — N183 Chronic kidney disease, stage 3 (moderate): Secondary | ICD-10-CM | POA: Diagnosis not present

## 2017-10-31 DIAGNOSIS — E1122 Type 2 diabetes mellitus with diabetic chronic kidney disease: Secondary | ICD-10-CM | POA: Diagnosis not present

## 2017-10-31 DIAGNOSIS — I5032 Chronic diastolic (congestive) heart failure: Secondary | ICD-10-CM | POA: Diagnosis not present

## 2017-10-31 DIAGNOSIS — I7771 Dissection of carotid artery: Secondary | ICD-10-CM | POA: Diagnosis not present

## 2017-10-31 DIAGNOSIS — J449 Chronic obstructive pulmonary disease, unspecified: Secondary | ICD-10-CM | POA: Diagnosis not present

## 2017-11-01 DIAGNOSIS — I7771 Dissection of carotid artery: Secondary | ICD-10-CM | POA: Diagnosis not present

## 2017-11-01 DIAGNOSIS — J449 Chronic obstructive pulmonary disease, unspecified: Secondary | ICD-10-CM | POA: Diagnosis not present

## 2017-11-01 DIAGNOSIS — I5032 Chronic diastolic (congestive) heart failure: Secondary | ICD-10-CM | POA: Diagnosis not present

## 2017-11-01 DIAGNOSIS — I69351 Hemiplegia and hemiparesis following cerebral infarction affecting right dominant side: Secondary | ICD-10-CM | POA: Diagnosis not present

## 2017-11-01 DIAGNOSIS — E1122 Type 2 diabetes mellitus with diabetic chronic kidney disease: Secondary | ICD-10-CM | POA: Diagnosis not present

## 2017-11-01 DIAGNOSIS — N183 Chronic kidney disease, stage 3 (moderate): Secondary | ICD-10-CM | POA: Diagnosis not present

## 2017-11-05 DIAGNOSIS — I69351 Hemiplegia and hemiparesis following cerebral infarction affecting right dominant side: Secondary | ICD-10-CM | POA: Diagnosis not present

## 2017-11-05 DIAGNOSIS — J449 Chronic obstructive pulmonary disease, unspecified: Secondary | ICD-10-CM | POA: Diagnosis not present

## 2017-11-05 DIAGNOSIS — E1122 Type 2 diabetes mellitus with diabetic chronic kidney disease: Secondary | ICD-10-CM | POA: Diagnosis not present

## 2017-11-05 DIAGNOSIS — I5032 Chronic diastolic (congestive) heart failure: Secondary | ICD-10-CM | POA: Diagnosis not present

## 2017-11-05 DIAGNOSIS — N183 Chronic kidney disease, stage 3 (moderate): Secondary | ICD-10-CM | POA: Diagnosis not present

## 2017-11-05 DIAGNOSIS — I7771 Dissection of carotid artery: Secondary | ICD-10-CM | POA: Diagnosis not present

## 2017-11-08 DIAGNOSIS — I5032 Chronic diastolic (congestive) heart failure: Secondary | ICD-10-CM | POA: Diagnosis not present

## 2017-11-08 DIAGNOSIS — I69351 Hemiplegia and hemiparesis following cerebral infarction affecting right dominant side: Secondary | ICD-10-CM | POA: Diagnosis not present

## 2017-11-08 DIAGNOSIS — E1122 Type 2 diabetes mellitus with diabetic chronic kidney disease: Secondary | ICD-10-CM | POA: Diagnosis not present

## 2017-11-08 DIAGNOSIS — N183 Chronic kidney disease, stage 3 (moderate): Secondary | ICD-10-CM | POA: Diagnosis not present

## 2017-11-08 DIAGNOSIS — J449 Chronic obstructive pulmonary disease, unspecified: Secondary | ICD-10-CM | POA: Diagnosis not present

## 2017-11-08 DIAGNOSIS — I7771 Dissection of carotid artery: Secondary | ICD-10-CM | POA: Diagnosis not present

## 2017-11-13 ENCOUNTER — Encounter: Payer: Self-pay | Admitting: Vascular Surgery

## 2017-11-13 ENCOUNTER — Other Ambulatory Visit: Payer: Self-pay

## 2017-11-13 ENCOUNTER — Ambulatory Visit (INDEPENDENT_AMBULATORY_CARE_PROVIDER_SITE_OTHER): Payer: Self-pay | Admitting: Vascular Surgery

## 2017-11-13 VITALS — BP 132/74 | HR 64 | Temp 98.4°F | Resp 20 | Ht 68.0 in | Wt 211.0 lb

## 2017-11-13 DIAGNOSIS — I6522 Occlusion and stenosis of left carotid artery: Secondary | ICD-10-CM

## 2017-11-13 NOTE — Progress Notes (Signed)
Patient name: Marco Acevedo MRN: [id number] DOB: 04-17-1934 Sex: male  REASON FOR VISIT: Follow-up recent carotid endarterectomy for traumatic left internal carotid artery stenosis  HPI: Marco Acevedo is a 83 y.o. male here today for follow-up of left carotid endarterectomy on 10/19/2017.  He had a preoperative stroke leaving him with mild right hand clumsiness.  He underwent uneventful endarterectomy.  He did have more than the usual amount of discomfort following the procedure.  He reports this is completely resolved.  He looks quite good today and has had no new neurologic deficits and reports that his hand is "99.9% back to normal".  Current Outpatient Medications  Medication Sig Dispense Refill  . allopurinol (ZYLOPRIM) 100 MG tablet Take 100 mg by mouth daily.    [name] alum & mag hydroxide-simeth (MAALOX/MYLANTA) 200-200-20 MG/5ML suspension Take 30 mLs by mouth as needed for indigestion or heartburn.    [name] amLODipine (NORVASC) 10 MG tablet Take 1 tablet (10 mg total) by mouth daily. 30 tablet 11  . aspirin EC 81 MG tablet Take 81 mg by mouth daily.    [name] atorvastatin (LIPITOR) 80 MG tablet Take 1 tablet (80 mg total) by mouth daily at 6 PM. 30 tablet 0  . clopidogrel (PLAVIX) 75 MG tablet Take 1 tablet (75 mg total) by mouth daily. 30 tablet 0  . docusate sodium (STOOL SOFTENER) 100 MG capsule Take 100 mg by mouth daily.    . Ferrous Sulfate (IRON) 325 (65 FE) MG TABS Take 1 tablet by mouth daily.    . finasteride (PROSCAR) 5 MG tablet Take 1 tablet (5 mg total) by mouth daily. 30 tablet 5  . glipiZIDE (GLUCOTROL) 5 MG tablet Take 1 tablet (5 mg total) by mouth daily before breakfast. (Patient taking differently: Take 5 mg by mouth 2 (two) times daily before a meal. ) 30 tablet 0  . guaifenesin (HUMIBID E) 400 MG TABS tablet Take 400 mg by mouth at bedtime.     . isosorbide mononitrate (IMDUR) 120 MG 24 hr tablet Take 1 tablet (120 mg total) by mouth daily. 90  tablet 3  . nitroGLYCERIN (NITROSTAT) 0.4 MG SL tablet Place 0.4 mg under the tongue every 5 (five) minutes as needed for chest pain.    . pioglitazone (ACTOS) 15 MG tablet Take 15 mg by mouth daily.  3  . primidone (MYSOLINE) 50 MG tablet Take 50 mg by mouth at bedtime.     . ranitidine (ZANTAC) 150 MG tablet Take 150 mg by mouth 2 (two) times daily.    . traMADol (ULTRAM) 50 MG tablet Take 1 tablet (50 mg total) by mouth at bedtime as needed for moderate pain.    . vitamin B-12 (CYANOCOBALAMIN) 1000 MCG tablet Take 1,000 mcg by mouth daily.    [name] albuterol (PROVENTIL HFA) 108 (90 BASE) MCG/ACT inhaler Inhale 2 puffs into the lungs every 6 (six) hours as needed for wheezing or shortness of breath.     [name] albuterol (PROVENTIL) (2.5 MG/3ML) 0.083% nebulizer solution Take 2.5 mg by nebulization every 6 (six) hours as needed for wheezing or shortness of breath.     . oxyCODONE-acetaminophen (PERCOCET/ROXICET) 5-325 MG tablet Take 1 tablet by mouth every 6 (six) hours as needed for moderate pain. (Patient not taking: Reported on 11/13/2017) 8 tablet 0   No current facility-administered medications for this visit.      PHYSICAL EXAM: Vitals:   11/13/17 1500 11/13/17 1502  BP: 120/80 132/74  Pulse: 64  Resp: 20   Temp: 98.4 F (36.9 C)   TempSrc: Oral   SpO2: 97%   Weight: 211 lb (95.7 kg)   Height: 5\' 8"  (1.727 m)     GENERAL: The patient is a well-nourished male, in no acute distress. The vital signs are documented above. His left neck incision is healing nicely with no evidence of infection.  Grossly intact neurologically  MEDICAL ISSUES: Stable status post a left carotid endarterectomy.  Will resume full activities with no limitation.  Did ask him to gradually ease into golf playing.  He is released to drive.  I will see him again in 9 months with repeat carotid duplex follow-up   [name], MD [facility] Tel [phone] Pager  [phone]

## 2017-11-14 ENCOUNTER — Encounter: Payer: Self-pay | Admitting: *Deleted

## 2017-11-22 ENCOUNTER — Ambulatory Visit: Payer: BLUE CROSS/BLUE SHIELD | Admitting: Adult Health

## 2017-11-27 ENCOUNTER — Ambulatory Visit (INDEPENDENT_AMBULATORY_CARE_PROVIDER_SITE_OTHER): Payer: Medicare Other | Admitting: Interventional Cardiology

## 2017-11-27 ENCOUNTER — Encounter: Payer: Self-pay | Admitting: Interventional Cardiology

## 2017-11-27 VITALS — BP 126/70 | HR 76 | Ht 68.0 in | Wt 210.2 lb

## 2017-11-27 DIAGNOSIS — N183 Chronic kidney disease, stage 3 unspecified: Secondary | ICD-10-CM

## 2017-11-27 DIAGNOSIS — I10 Essential (primary) hypertension: Secondary | ICD-10-CM | POA: Diagnosis not present

## 2017-11-27 DIAGNOSIS — I25119 Atherosclerotic heart disease of native coronary artery with unspecified angina pectoris: Secondary | ICD-10-CM | POA: Diagnosis not present

## 2017-11-27 DIAGNOSIS — G4733 Obstructive sleep apnea (adult) (pediatric): Secondary | ICD-10-CM | POA: Diagnosis not present

## 2017-11-27 DIAGNOSIS — Z9889 Other specified postprocedural states: Secondary | ICD-10-CM | POA: Diagnosis not present

## 2017-11-27 DIAGNOSIS — E669 Obesity, unspecified: Secondary | ICD-10-CM | POA: Diagnosis not present

## 2017-11-27 DIAGNOSIS — I129 Hypertensive chronic kidney disease with stage 1 through stage 4 chronic kidney disease, or unspecified chronic kidney disease: Secondary | ICD-10-CM | POA: Diagnosis not present

## 2017-11-27 DIAGNOSIS — I6522 Occlusion and stenosis of left carotid artery: Secondary | ICD-10-CM | POA: Diagnosis not present

## 2017-11-27 DIAGNOSIS — E1122 Type 2 diabetes mellitus with diabetic chronic kidney disease: Secondary | ICD-10-CM | POA: Diagnosis not present

## 2017-11-27 DIAGNOSIS — E7800 Pure hypercholesterolemia, unspecified: Secondary | ICD-10-CM | POA: Diagnosis not present

## 2017-11-27 DIAGNOSIS — G629 Polyneuropathy, unspecified: Secondary | ICD-10-CM | POA: Diagnosis not present

## 2017-11-27 DIAGNOSIS — I639 Cerebral infarction, unspecified: Secondary | ICD-10-CM | POA: Diagnosis not present

## 2017-11-27 NOTE — Patient Instructions (Signed)
Medication Instructions:  Your physician recommends that you continue on your current medications as directed. Please refer to the Current Medication list given to you today.  Labwork: None  Testing/Procedures: None  Follow-Up: Your physician wants you to follow-up in: 9 months with Dr. Tamala Julian.  You will receive a reminder letter in the mail two months in advance. If you don't receive a letter, please call our office to schedule the follow-up appointment.   Any Other Special Instructions Will Be Listed Below (If Applicable).     If you need a refill on your cardiac medications before your next appointment, please call your pharmacy.

## 2017-11-27 NOTE — Progress Notes (Signed)
Cardiology Office Note:    Date:  11/27/2017   ID:  Marco Acevedo, DOB 29-Sep-1934, MRN [id number]  PCP:  Lawerance Cruel, MD  Cardiologist:  No primary care provider on file.   Referring MD: Lawerance Cruel, MD   Chief Complaint  Patient presents with  . Coronary Artery Disease  . Cerebrovascular Accident    History of Present Illness:    Marco Acevedo is a 83 y.o. male with a hx of CAD and prior circumflex stents, Chronic DHF, hyperlipidemia, hypertension, obesity and OSA.  History of CVA October 14, 2017 related to, left brain atheroembolic event from the left carotid.  Underwent left carotid endarterectomy on 10/19/2017.   Patient denies angina.  Recent neurological event as outlined above was not associated with any cardiac complications or complaints.  He suddenly developed right arm weakness while in the [employer] playing golf with his son and grandchildren.  He was brought back to [place] where evaluation and ultimate treatment as outlined above was carried out.  He denies angina pectoris, palpitations, and syncope.  He has not needed sublingual nitroglycerin.  He denies lower extremity swelling.  No PND has been identified.   Past Medical History:  Diagnosis Date  . Anemia    low iron  . Arthritis   . BPH (benign prostatic hyperplasia)   . Bruises easily   . CAD (coronary artery disease)    a. per Dr. Thompson Caul note, prior stenting of Cx in 2007 and 2012. b. DES to Cx in 09/2012.  [name] Chronic diastolic CHF (congestive heart failure) ([place])   . CKD (chronic kidney disease), stage [name] ([place])   . Claustrophobia   . Complication of anesthesia    has to have head elevated to keep from strangling on post nasal drip  . COPD (chronic obstructive pulmonary disease) ([place])   . Diabetes mellitus type II    type 2  . Diarrhea 2015   had for a year and a half  . Dyspnea   . GERD (gastroesophageal reflux disease)   . Gout   . Granuloma annulare   . History of  hiatal hernia   . History of kidney stones    Litthotrispy  . Hyperlipidemia   . Hypertension   . Neuropathy   . Obesity   . OSA (obstructive sleep apnea)   . Right sided weakness   . Stroke ([place]) 10/2017   Weakness right arm and leg  . Wears dentures    top  . Wears glasses     Past Surgical History:  Procedure Laterality Date  . BLEPHAROPLASTY    . CARDIAC CATHETERIZATION  [phone]   X 2 stents  . CHOLECYSTECTOMY N/A 09/23/2014   Procedure: LAPAROSCOPIC CHOLECYSTECTOMY WITH INTRAOPERATIVE CHOLANGIOGRAM;  Surgeon: Autumn Messing [name], MD;  Location: [place];  Service: General;  Laterality: N/A;  . COLONOSCOPY    . COLONOSCOPY WITH PROPOFOL N/A 03/22/2016   Procedure: COLONOSCOPY WITH PROPOFOL;  Surgeon: Wilford Corner, MD;  Location: [facility] ENDOSCOPY;  Service: Endoscopy;  Laterality: N/A;  . ENDARTERECTOMY Left 10/19/2017   Procedure: ENDARTERECTOMY CAROTID LEFT;  Surgeon: [name], MD;  Location: [facility] OR;  Service: Vascular;  Laterality: Left;  . ESOPHAGOGASTRODUODENOSCOPY (EGD) WITH PROPOFOL N/A 03/22/2016   Procedure: ESOPHAGOGASTRODUODENOSCOPY (EGD) WITH PROPOFOL;  Surgeon: Wilford Corner, MD;  Location: [facility] ENDOSCOPY;  Service: Endoscopy;  Laterality: N/A;  . EYE SURGERY     both cataracts  . KNEE ARTHROSCOPY WITH MEDIAL MENISECTOMY Right 08/19/2013  Procedure: RIGHT KNEE ARTHROSCOPY WITH PARTIAL MEDIAL MENISECTOMY AND CHONDROPLASTY;  Surgeon: Hessie Dibble, MD;  Location: [place];  Service: Orthopedics;  Laterality: Right;  . LEFT HEART CATH AND CORONARY ANGIOGRAPHY N/A 06/28/2016   Procedure: Left Heart Cath and Coronary Angiography;  Surgeon: Belva Crome, MD;  Location: [place] CV LAB;  Service: Cardiovascular;  Laterality: N/A;  . LEFT HEART CATHETERIZATION WITH CORONARY ANGIOGRAM N/A 10/03/2011   Procedure: LEFT HEART CATHETERIZATION WITH CORONARY ANGIOGRAM;  Surgeon: Sinclair Grooms, MD;  Location: [place] CATH LAB;  Service: Cardiovascular;   Laterality: N/A;  . LEFT HEART CATHETERIZATION WITH CORONARY ANGIOGRAM N/A 09/12/2012   Procedure: LEFT HEART CATHETERIZATION WITH CORONARY ANGIOGRAM;  Surgeon: Sinclair Grooms, MD;  Location: [facility] CATH LAB;  Service: Cardiovascular;  Laterality: N/A;  . LIPOMA EXCISION     Biospy only; left parotid gland  . LITHOTRIPSY    . none    . PERCUTANEOUS CORONARY STENT INTERVENTION (PCI-S) N/A 09/17/2012   Procedure: PERCUTANEOUS CORONARY STENT INTERVENTION (PCI-S);  Surgeon: Sinclair Grooms, MD;  Location: [facility] CATH LAB;  Service: Cardiovascular;  Laterality: N/A;  . TRIGGER FINGER RELEASE Left 12/21/2015   Procedure: LEFT LONG FINGER TRIGGER RELEASE ;  Surgeon: Milly Jakob, MD;  Location: [place];  Service: Orthopedics;  Laterality: Left;    Current Medications: Current Meds  Medication Sig  . albuterol (PROVENTIL HFA) 108 (90 BASE) MCG/ACT inhaler Inhale 2 puffs into the lungs every 6 (six) hours as needed for wheezing or shortness of breath.   [name] albuterol (PROVENTIL) (2.5 MG/3ML) 0.083% nebulizer solution Take 2.5 mg by nebulization every 6 (six) hours as needed for wheezing or shortness of breath.   . allopurinol (ZYLOPRIM) 100 MG tablet Take 100 mg by mouth daily.  [name] amLODipine (NORVASC) 10 MG tablet Take 1 tablet (10 mg total) by mouth daily.  [name] aspirin EC 81 MG tablet Take 81 mg by mouth daily.  [name] atorvastatin (LIPITOR) 80 MG tablet Take 1 tablet (80 mg total) by mouth daily at 6 PM.  . clopidogrel (PLAVIX) 75 MG tablet Take 1 tablet (75 mg total) by mouth daily.  [name] docusate sodium (STOOL SOFTENER) 100 MG capsule Take 100 mg by mouth daily.  . Ferrous Sulfate (IRON) 325 (65 FE) MG TABS Take 1 tablet by mouth daily.  . finasteride (PROSCAR) 5 MG tablet Take 1 tablet (5 mg total) by mouth daily.  [name] glipiZIDE (GLUCOTROL) 5 MG tablet Take 5 mg by mouth 2 (two) times daily before a meal.  . guaifenesin (HUMIBID E) 400 MG TABS tablet Take 400 mg by mouth at bedtime.   .  isosorbide mononitrate (IMDUR) 120 MG 24 hr tablet Take 1 tablet (120 mg total) by mouth daily.  . nitroGLYCERIN (NITROSTAT) 0.4 MG SL tablet Place 0.4 mg under the tongue every 5 (five) minutes as needed for chest pain.  [name] oxyCODONE-acetaminophen (PERCOCET/ROXICET) 5-325 MG tablet Take 1 tablet by mouth every 6 (six) hours as needed for moderate pain.  . pioglitazone (ACTOS) 15 MG tablet Take 15 mg by mouth daily.  . primidone (MYSOLINE) 50 MG tablet Take 50 mg by mouth at bedtime.   . ranitidine (ZANTAC) 150 MG tablet Take 150 mg by mouth 2 (two) times daily.  . traMADol (ULTRAM) 50 MG tablet Take 1 tablet (50 mg total) by mouth at bedtime as needed for moderate pain.  . vitamin B-12 (CYANOCOBALAMIN) 1000 MCG tablet Take 1,000 mcg by mouth  daily.     Allergies:   Cimetidine   Social History   Socioeconomic History  . Marital status: Married    Spouse name: Not on file  . Number of children: Not on file  . Years of education: Not on file  . Highest education level: Not on file  Occupational History  . Occupation: retired  Scientific laboratory technician  . Financial resource strain: Not on file  . Food insecurity:    Worry: Not on file    Inability: Not on file  . Transportation needs:    Medical: Not on file    Non-medical: Not on file  Tobacco Use  . Smoking status: Former Smoker    Packs/day: 1.00    Years: 64.00    Pack years: 64.00    Types: Cigarettes    Last attempt to quit: 05/29/2011    Years since quitting: 6.5  . Smokeless tobacco: Never Used  Substance and Sexual Activity  . Alcohol use: Yes    Comment: rarely  . Drug use: No  . Sexual activity: Not Currently  Lifestyle  . Physical activity:    Days per week: Not on file    Minutes per session: Not on file  . Stress: Not on file  Relationships  . Social connections:    Talks on phone: Not on file    Gets together: Not on file    Attends religious service: Not on file    Active member of club or organization: Not on file     Attends meetings of clubs or organizations: Not on file    Relationship status: Not on file  Other Topics Concern  . Not on file  Social History Narrative   Denies Caffeine use      Family History: The patient's family history includes Aneurysm in his mother; Aortic aneurysm in his father; COPD in his sister; Cancer in his brother; Other in his brother, sister, and sister.  ROS:   Please see the history of present illness.    Back pain.  All other systems reviewed and are negative.  EKGs/Labs/Other Studies Reviewed:    The following studies were reviewed today: Reviewed data from recent hospital stay and left carotid endarterectomy report.  EKG:  EKG is  ordered today.  The ekg ordered today demonstrates October 18, 2017 demonstrated sinus rhythm, poor R wave progression, and old inferior infarct.  Recent Labs: 10/17/2017: ALT 20 10/20/2017: Hemoglobin 11.0; Platelets 226 10/21/2017: BUN 27; Creatinine, Ser 2.16; Potassium 4.3; Sodium 135  Recent Lipid Panel    Component Value Date/Time   CHOL 220 (H) 10/14/2017 0742   TRIG 156 (H) 10/14/2017 0742   HDL 39 (L) 10/14/2017 0742   CHOLHDL 5.6 10/14/2017 0742   VLDL 31 10/14/2017 0742   LDLCALC 150 (H) 10/14/2017 0742    Physical Exam:    VS:  BP 126/70   Pulse 76   Ht 5\' 8"  (1.727 m)   Wt 210 lb 3.2 oz (95.3 kg)   BMI 31.96 kg/m      Wt Readings from Last 3 Encounters:  11/27/17 210 lb 3.2 oz (95.3 kg)  11/13/17 211 lb (95.7 kg)  10/19/17 205 lb (93 kg)     GEN: Obese. Well developed in no acute distress   HEENT: Normal NECK: No JVD. LYMPHATICS: No lymphadenopathy CARDIAC: RRR, 2/6 scratchy right upper sternal border systolic murmur, no gallop, no edema. VASCULAR: 2+ radial bilateral pulses.  No bruits. RESPIRATORY:  Clear to auscultation without rales,  wheezing or rhonchi  ABDOMEN: Soft, non-tender, non-distended, No pulsatile mass, MUSCULOSKELETAL: No deformity  SKIN: Warm and dry NEUROLOGIC:  Alert and  oriented x 3 PSYCHIATRIC:  Normal affect   ASSESSMENT:    1. Coronary artery disease involving native coronary artery of native heart with angina pectoris ([place])   2. Essential hypertension   3. Pure hypercholesterolemia   4. CKD (chronic kidney disease) stage 3, GFR 30-59 ml/min (H[facility])   5. OSA (obstructive sleep apnea)    PLAN:    In order of problems listed above:  1. Stable coronary artery disease without symptomatic angina.  Physical activity is acceptable.  Risk factor modification to include LDL less than 70, A1c less than 7, and blood pressure 130/80 mmHg. 2. Titrate blood pressure medications to maintain target of 130/80 mmHg. 3. Target less than 70.  Continue high intensity statin, atorvastatin 70 mg daily. 4. Not reevaluated 5. Encouraged CPAP compliance.  Clinical follow-up in 6 to 9 months.  Aerobic activity as tolerated.  Call if clinical problems.   Medication Adjustments/Labs and Tests Ordered: Current medicines are reviewed at length with the patient today.  Concerns regarding medicines are outlined above.  No orders of the defined types were placed in this encounter.  No orders of the defined types were placed in this encounter.   Patient Instructions  Medication Instructions:  Your physician recommends that you continue on your current medications as directed. Please refer to the Current Medication list given to you today.  Labwork: None  Testing/Procedures: None  Follow-Up: Your physician wants you to follow-up in: 9 months with Dr. Tamala Julian.  You will receive a reminder letter in the mail two months in advance. If you don't receive a letter, please call our office to schedule the follow-up appointment.   Any Other Special Instructions Will Be Listed Below (If Applicable).     If you need a refill on your cardiac medications before your next appointment, please call your pharmacy.      Signed, Sinclair Grooms, MD  11/27/2017 5:33 PM    [place]

## 2017-11-29 NOTE — Progress Notes (Signed)
[facility] [address] [place] [address] [phone]       OFFICE FOLLOW UP NOTE  Marco Acevedo Date of Birth:  1935/03/27 Medical Record Number:  [id number]   Reason for Referral:  hospital stroke follow up  CHIEF COMPLAINT:  Chief Complaint  Patient presents with  . Follow-up    pt with wife, rm 9 pt states that things are well "he is getting ready to go play golf"    HPI: Marco Acevedo is being seen today for initial visit in the office for left MCA territory infarcts on 10/13/17. History obtained from patient, wife and chart review. Reviewed all radiology images and labs personally.  Mr. Marco Acevedo is a 83 y.o. male with history of remote tobacco use, obstructive sleep apnea (patient denies dx), obesity, hypertension, hyperlipidemia, gout, diabetes mellitus, COPD, claustrophobia, previous strokes by imaging, chronic kidney disease, congestive heart failure, and coronary artery disease with previous stents  who presented with right-sided weakness.  Patient initially was seen at [facility] and she is on vacation.  Per notes, CT of head was negative for acute findings.  At that time they chose to leave AMA and come to [facility] which is home for patient for further evaluation.  Per notes, he did receive baby aspirin prior to leaving the hospital. He did not receive IV t-PA due to late presentation.  MRI head reviewed and showed small acute left MCA territory infarcts.  MRA head and neck showed moderate to severe left ICA origin stenosis.  Carotid Doppler showed right ICA stenosis of 1 to 39% and left ICA stenosis of 40 to 59% with bilateral vertebral arteries demonstrate antegrade flow.  2D echo showed an EF of 55 to 60% without cardiac source of embolus.  LDL 115 and his patient was on Pravachol 40 mg PTA recommended Lipitor 80 mg daily.  A1c 7.0 and recommended tight glycemic control with close PCP follow-up for continued DM management.   Recommend DAPT with aspirin and Plavix for 3 weeks on Plavix only.  HTN stable during admission and recommended long-term BP goal normotensive range.  Due to stenosis of left ICA, recommend further outpatient follow-up with vascular surgery for potential intervention.  Patient was discharged home in stable condition. On 10/19/2017, patient underwent left carotid enterectomy which was uneventful per notes.  Did have follow-up appointment on 11/13/2017 with Dr. Donnetta Hutching and denied new neurological deficits and per notes, reported that his hand is "99.9% back to normal".  Recommend resuming all previous activities as well as driving and repeat carotid duplex in 9 months.  Interval history 11/30/2017: Patient is being seen today for hospital stroke follow-up and is accompanied by his wife.  He denies any residual deficits and states he has been doing well.  He does have some residual numbness at incision site from carotid enterectomy but this has been improving.  He continues to take both aspirin and Plavix with mild bruising but no bleeding.  He states he was told to continue DAPT until he is seen by vascular surgery for repeat carotid duplex in 9 months.  Advised him that I will follow-up with vascular surgeon for duration of DAPT.  Continues to take Lipitor without side effects myalgias.  Blood pressure today satisfactory at 112/59.  Patient does monitor glucose levels at home but per patient, [facility] only [facility] glucose monitoring every 3 days.  He states in the hospital, they are fluctuating and is unable to adequately track  levels due to being able to monitor only every 3 days.  He also has complaints of neck pain with decreased range of motion which is a chronic complaint where he is followed by orthopedics for injections.  Denies radiculopathy down his arms or any other neurological complaint regarding neck pain.  When questioned about previous diagnosis of OSA, he states he had a sleep study in 2013 and was told he has  mild apnea but does not believe this to be accurate as he had a difficult time sleeping during the study.  He also states that he was drinking alcohol along with smoking at that time.  Patient declines repeat sleep study at this time.  Patient continues to stay active with previous activities and denies new or worsening stroke/TIA symptoms.  ROS:   14 system review of systems performed and negative with exception of ringing in ears, cough, snoring, diarrhea, constipation, and easy bruising  PMH:  Past Medical History:  Diagnosis Date  . Anemia    low iron  . Arthritis   . BPH (benign prostatic hyperplasia)   . Bruises easily   . CAD (coronary artery disease)    a. per Dr. Thompson Caul note, prior stenting of Cx in 2007 and 2012. b. DES to Cx in 09/2012.  [name] Chronic diastolic CHF (congestive heart failure) ([place])   . CKD (chronic kidney disease), stage [name] ([place])   . Claustrophobia   . Complication of anesthesia    has to have head elevated to keep from strangling on post nasal drip  . COPD (chronic obstructive pulmonary disease) ([place])   . Diabetes mellitus type II    type 2  . Diarrhea 2015   had for a year and a half  . Dyspnea   . GERD (gastroesophageal reflux disease)   . Gout   . Granuloma annulare   . History of hiatal hernia   . History of kidney stones    [place]  . Hyperlipidemia   . Hypertension   . Neuropathy   . Obesity   . OSA (obstructive sleep apnea)   . Right sided weakness   . Stroke ([place]) 10/2017   Weakness right arm and leg  . Wears dentures    top  . Wears glasses     PSH:  Past Surgical History:  Procedure Laterality Date  . BLEPHAROPLASTY    . CARDIAC CATHETERIZATION  [phone]   X 2 stents  . CHOLECYSTECTOMY N/A 09/23/2014   Procedure: LAPAROSCOPIC CHOLECYSTECTOMY WITH INTRAOPERATIVE CHOLANGIOGRAM;  Surgeon: Autumn Messing [name], MD;  Location: [facility];  Service: General;  Laterality: N/A;  . COLONOSCOPY    . COLONOSCOPY WITH PROPOFOL N/A 03/22/2016    Procedure: COLONOSCOPY WITH PROPOFOL;  Surgeon: Wilford Corner, MD;  Location: [facility] ENDOSCOPY;  Service: Endoscopy;  Laterality: N/A;  . ENDARTERECTOMY Left 10/19/2017   Procedure: ENDARTERECTOMY CAROTID LEFT;  Surgeon: [name], MD;  Location[facility] OR;  Service: Vascular;  Laterality: Left;  . ESOPHAGOGASTRODUODENOSCOPY (EGD) WITH PROPOFOL N/A 03/22/2016   Procedure: ESOPHAGOGASTRODUODENOSCOPY (EGD) WITH PROPOFOL;  Surgeon: Wilford Corner, MD;  Location: [facility] ENDOSCOPY;  Service: Endoscopy;  Laterality: N/A;  . EYE SURGERY     both cataracts  . KNEE ARTHROSCOPY WITH MEDIAL MENISECTOMY Right 08/19/2013   Procedure: RIGHT KNEE ARTHROSCOPY WITH PARTIAL MEDIAL MENISECTOMY AND CHONDROPLASTY;  Surgeon: Hessie Dibble, MD;  Location: [place];  Service: Orthopedics;  Laterality: Right;  . LEFT HEART CATH AND CORONARY ANGIOGRAPHY N/A 06/28/2016   Procedure: Left  Heart Cath and Coronary Angiography;  Surgeon: Belva Crome, MD;  Location: [facility] CV LAB;  Service: Cardiovascular;  Laterality: N/A;  . LEFT HEART CATHETERIZATION WITH CORONARY ANGIOGRAM N/A 10/03/2011   Procedure: LEFT HEART CATHETERIZATION WITH CORONARY ANGIOGRAM;  Surgeon: Sinclair Grooms, MD;  Location: [facility] CATH LAB;  Service: Cardiovascular;  Laterality: N/A;  . LEFT HEART CATHETERIZATION WITH CORONARY ANGIOGRAM N/A 09/12/2012   Procedure: LEFT HEART CATHETERIZATION WITH CORONARY ANGIOGRAM;  Surgeon: Sinclair Grooms, MD;  Location: [facility] CATH LAB;  Service: Cardiovascular;  Laterality: N/A;  . LIPOMA EXCISION     Biospy only; left parotid gland  . LITHOTRIPSY    . none    . PERCUTANEOUS CORONARY STENT INTERVENTION (PCI-S) N/A 09/17/2012   Procedure: PERCUTANEOUS CORONARY STENT INTERVENTION (PCI-S);  Surgeon: Sinclair Grooms, MD;  Location: [facility] CATH LAB;  Service: Cardiovascular;  Laterality: N/A;  . TRIGGER FINGER RELEASE Left 12/21/2015   Procedure: LEFT LONG FINGER TRIGGER RELEASE ;  Surgeon: Milly Jakob, MD;   Location: [place];  Service: Orthopedics;  Laterality: Left;    Social History:  Social History   Socioeconomic History  . Marital status: Married    Spouse name: Not on file  . Number of children: Not on file  . Years of education: Not on file  . Highest education level: Not on file  Occupational History  . Occupation: retired  Scientific laboratory technician  . Financial resource strain: Not on file  . Food insecurity:    Worry: Not on file    Inability: Not on file  . Transportation needs:    Medical: Not on file    Non-medical: Not on file  Tobacco Use  . Smoking status: Former Smoker    Packs/day: 1.00    Years: 64.00    Pack years: 64.00    Types: Cigarettes    Last attempt to quit: 05/29/2011    Years since quitting: 6.5  . Smokeless tobacco: Never Used  Substance and Sexual Activity  . Alcohol use: Yes    Comment: rarely  . Drug use: No  . Sexual activity: Not Currently  Lifestyle  . Physical activity:    Days per week: Not on file    Minutes per session: Not on file  . Stress: Not on file  Relationships  . Social connections:    Talks on phone: Not on file    Gets together: Not on file    Attends religious service: Not on file    Active member of club or organization: Not on file    Attends meetings of clubs or organizations: Not on file    Relationship status: Not on file  . Intimate partner violence:    Fear of current or ex partner: Not on file    Emotionally abused: Not on file    Physically abused: Not on file    Forced sexual activity: Not on file  Other Topics Concern  . Not on file  Social History Narrative   Denies Caffeine use     Family History:  Family History  Problem Relation Age of Onset  . Aortic aneurysm Father   . Aneurysm Mother        brain  . Cancer Brother   . COPD Sister   . Other Sister        health hx unknown  . Other Sister        health hx unknown  . Other Brother  health hx unknown    Medications:     Current Outpatient Medications on File Prior to Visit  Medication Sig Dispense Refill  . albuterol (PROVENTIL HFA) 108 (90 BASE) MCG/ACT inhaler Inhale 2 puffs into the lungs every 6 (six) hours as needed for wheezing or shortness of breath.     [name] albuterol (PROVENTIL) (2.5 MG/3ML) 0.083% nebulizer solution Take 2.5 mg by nebulization every 6 (six) hours as needed for wheezing or shortness of breath.     . allopurinol (ZYLOPRIM) 100 MG tablet Take 100 mg by mouth daily.    [name] amLODipine (NORVASC) 10 MG tablet Take 1 tablet (10 mg total) by mouth daily. 30 tablet 11  . aspirin EC 81 MG tablet Take 81 mg by mouth daily.    [name] atorvastatin (LIPITOR) 80 MG tablet Take 1 tablet (80 mg total) by mouth daily at 6 PM. 30 tablet 0  . clopidogrel (PLAVIX) 75 MG tablet Take 1 tablet (75 mg total) by mouth daily. 30 tablet 0  . docusate sodium (STOOL SOFTENER) 100 MG capsule Take 100 mg by mouth daily.    . Ferrous Sulfate (IRON) 325 (65 FE) MG TABS Take 1 tablet by mouth daily.    . finasteride (PROSCAR) 5 MG tablet Take 1 tablet (5 mg total) by mouth daily. 30 tablet 5  . glipiZIDE (GLUCOTROL) 5 MG tablet Take 5 mg by mouth 2 (two) times daily before a meal.    . guaifenesin (HUMIBID E) 400 MG TABS tablet Take 400 mg by mouth at bedtime.     . isosorbide mononitrate (IMDUR) 120 MG 24 hr tablet Take 1 tablet (120 mg total) by mouth daily. 90 tablet 3  . nitroGLYCERIN (NITROSTAT) 0.4 MG SL tablet Place 0.4 mg under the tongue every 5 (five) minutes as needed for chest pain.    . pioglitazone (ACTOS) 15 MG tablet Take 15 mg by mouth daily.  3  . primidone (MYSOLINE) 50 MG tablet Take 50 mg by mouth at bedtime.     . ranitidine (ZANTAC) 150 MG tablet Take 150 mg by mouth 2 (two) times daily.    . traMADol (ULTRAM) 50 MG tablet Take 1 tablet (50 mg total) by mouth at bedtime as needed for moderate pain.    . vitamin B-12 (CYANOCOBALAMIN) 1000 MCG tablet Take 1,000 mcg by mouth daily.     No current  facility-administered medications on file prior to visit.     Allergies:   Allergies  Allergen Reactions  . Cimetidine Other (See Comments)    Anxiety Attacks     Physical Exam  Vitals:   11/30/17 0934  BP: (!) 112/59  Pulse: 75  Weight: 211 lb (95.7 kg)  Height: 5\' 8"  (1.727 m)   Body mass index is 32.08 kg/m. No exam data present  General: well developed, well nourished, pleasant elderly Caucasian male, seated, in no evident distress Head: head normocephalic and atraumatic.   Neck: supple with no carotid or supraclavicular bruits Cardiovascular: regular rate and rhythm, no murmurs Musculoskeletal: no deformity Skin:  no rash/petichiae; recent surgical procedure incision looks well without evidence of infection Vascular:  Normal pulses all extremities  Neurologic Exam Mental Status: Awake and fully alert. Oriented to place and time. Recent and remote memory intact. Attention span, concentration and fund of knowledge appropriate. Mood and affect appropriate.  Cranial Nerves: Fundoscopic exam reveals sharp disc margins. Pupils equal, briskly reactive to light. Extraocular movements full without nystagmus. Visual fields full to confrontation. Hearing intact.  Facial sensation intact. Face, tongue, palate moves normally and symmetrically.  Motor: Normal bulk and tone. Normal strength in all tested extremity muscles. Sensory.: intact to touch , pinprick , position and vibratory sensation.  Coordination: Rapid alternating movements normal in all extremities. Finger-to-nose and heel-to-shin performed accurately bilaterally. Gait and Station: Arises from chair without difficulty. Stance is normal. Gait demonstrates normal stride length and balance . Able to heel, toe and tandem walk without difficulty.  Reflexes: 1+ and symmetric. Toes downgoing.    NIHSS  0 Modified Rankin  0    Diagnostic Data (Labs, Imaging, Testing)  CT HEAD WO CONTRAST 10/17/2017 IMPRESSION: 1. Stable  atrophy and moderate small vessel ischemic change. No extension of the MR demonstrated small left MCA infarct is seen and no hemorrhage is noted. 2. Small amount of fluid in the right frontal sinus.  MR BRAIN WO CONTRAST MR MRA HEAD  MR MRA NECK 10/13/2017 IMPRESSION: 1. Small acute left MCA territory infarcts. 2. Small chronic left MCA infarcts and moderate chronic small vessel ischemic white matter disease. 3. Motion degraded head MRA without large vessel occlusion or definite flow limiting proximal stenosis. 4. Limited neck MRA due to motion and noncontrast technique. Moderate to severe left ICA origin stenosis.  ECHOCARDIOGRAM 10/14/2017 Study Conclusions - Left ventricle: The cavity size was normal. There was mild   concentric hypertrophy. Systolic function was normal. The   estimated ejection fraction was in the range of 55% to 60%. Wall   motion was normal; there were no regional wall motion   abnormalities. Doppler parameters are consistent with abnormal   left ventricular relaxation (grade 1 diastolic dysfunction).   Doppler parameters are consistent with elevated ventricular   end-diastolic filling pressure. - Aortic valve: There was mild stenosis. There was no   regurgitation. - Left atrium: The atrium was mildly dilated. - Right ventricle: The cavity size was normal. Wall thickness was   normal. Systolic function was normal. - Right atrium: The atrium was normal in size. - Tricuspid valve: There was no regurgitation. - Pulmonary arteries: Systolic pressure was within the normal   range. - Inferior vena cava: The vessel was normal in size. - Pericardium, extracardiac: A trivial pericardial effusion was   identified.  VAS US carotid duplex bilateral 10/15/2017 Final Interpretation: Right Carotid: Velocities in the right ICA are consistent with a 1-39% stenosis. Left Carotid: Velocities in the left ICA are consistent with a 40-59% stenosis. Vertebrals: Bilateral  vertebral arteries demonstrate antegrade flow.   ASSESSMENT: Marco Acevedo is a 83 y.o. year old male here with small acute left MCA territory infarcts on 10/13/2017 secondary to embolic due to left internal carotid artery stenosis. Vascular risk factors include OSA (patient denies diagnosis), obesity, HTN, HLD, DM, CHF, CAD with stents and previous strokes.  Patient is being seen today for hospital stroke follow-up and overall is doing well without residual deficits.  PLAN: -Continue aspirin 81 mg daily and clopidogrel 75 mg daily  and Lipitor for secondary stroke prevention -f/u with vascular surgery as scheduled for repeat carotid Doppler and office visit -inform patient that they will make final decision regarding length of DAPT with aspirin and Plavix -F/u with PCP regarding your HLD, HTN, and DM management -Letter provided to patient requesting patient monitor glucose levels twice daily for tighter management and secondary stroke prevention -patient planning on filing claim with patient advocate in order to receive more glucose testing strips for more frequent testing then every 3-day testing -continue to monitor  BP at home -Advised if patient would like to repeat sleep study in the future to advise [place] we can place referral -advised to continue to stay active and maintain a healthy diet -Maintain strict control of hypertension with blood pressure goal below 130/90, diabetes with hemoglobin A1c goal below 6.5% and cholesterol with LDL cholesterol (bad cholesterol) goal below 70 mg/dL. I also advised the patient to eat a healthy diet with plenty of whole grains, cereals, fruits and vegetables, exercise regularly and maintain ideal body weight.  Follow up in 3 months or call earlier if needed   Greater than 50% of time during this 25 minute visit was spent on counseling,explanation of diagnosis of left MCA territory infarcts, reviewing risk factor management of HTN, HLD, DM, CHF and CAD, planning  of further management, discussion with patient and family and coordination of care    Venancio Poisson, AGNP-BC  [facility] [address] [place], [place] [address]  Phone [phone] Fax [phone] Note: This document was prepared with digital dictation and possible smart phrase technology. Any transcriptional errors that result from this process are unintentional.

## 2017-11-30 ENCOUNTER — Ambulatory Visit (INDEPENDENT_AMBULATORY_CARE_PROVIDER_SITE_OTHER): Payer: Medicare Other | Admitting: Adult Health

## 2017-11-30 ENCOUNTER — Encounter: Payer: Self-pay | Admitting: Adult Health

## 2017-11-30 ENCOUNTER — Encounter: Payer: Self-pay | Admitting: Neurology

## 2017-11-30 VITALS — BP 112/59 | HR 75 | Ht 68.0 in | Wt 211.0 lb

## 2017-11-30 DIAGNOSIS — Z9889 Other specified postprocedural states: Secondary | ICD-10-CM

## 2017-11-30 DIAGNOSIS — E785 Hyperlipidemia, unspecified: Secondary | ICD-10-CM | POA: Diagnosis not present

## 2017-11-30 DIAGNOSIS — I10 Essential (primary) hypertension: Secondary | ICD-10-CM

## 2017-11-30 DIAGNOSIS — I63512 Cerebral infarction due to unspecified occlusion or stenosis of left middle cerebral artery: Secondary | ICD-10-CM | POA: Diagnosis not present

## 2017-11-30 DIAGNOSIS — E119 Type 2 diabetes mellitus without complications: Secondary | ICD-10-CM

## 2017-11-30 NOTE — Patient Instructions (Addendum)
Continue aspirin 81 mg daily and clopidogrel 75 mg daily  and lipitor  for secondary stroke prevention  Continue to follow up with PCP regarding cholesterol, blood pressure and diabetes management   Follow up with Dr. Donnetta Hutching as scheduled for carotid surgery  Continue to stay active and maintain a healthy diet  Continue to monitor blood pressure at home  Maintain strict control of hypertension with blood pressure goal below 130/90, diabetes with hemoglobin A1c goal below 6.5% and cholesterol with LDL cholesterol (bad cholesterol) goal below 70 mg/dL. I also advised the patient to eat a healthy diet with plenty of whole grains, cereals, fruits and vegetables, exercise regularly and maintain ideal body weight.  Followup in the future with me in 3 months or call earlier if needed       Thank you for coming to see [place] at [facility]. I hope we have been able to provide you high quality care today.  You may receive a patient satisfaction survey over the next few weeks. We would appreciate your feedback and comments so that we may continue to improve ourselves and the health of our patients.

## 2017-12-03 ENCOUNTER — Telehealth: Payer: Self-pay

## 2017-12-03 NOTE — Progress Notes (Signed)
Marco Acevedo, Please notify patient to stop aspirin and continue plavix only. This was discussed during previous appointment. Per Dr. Donnetta Hutching, does not need to continue DAPT as continuation puts him at a greater risk for brain bleed. Thank you.

## 2017-12-03 NOTE — Telephone Encounter (Signed)
RN call patient that per Dr. Donnetta Hutching and Janett Billow NP he should stop the aspirin, and take plavix only ongoing. Rn stated to continue the DAPT it puts him at greater risk for brain bleed. The pt verbalized understanding to stop the aspirin, and continue plavix ongoing.

## 2017-12-03 NOTE — Telephone Encounter (Signed)
Katrina, Please notify patient to stop aspirin and continue plavix only. This was discussed during previous appointment. Per Dr. Donnetta Hutching, does not need to continue DAPT as continuation puts him at a greater risk for brain bleed. Thank you.

## 2017-12-07 NOTE — Progress Notes (Signed)
I agree with the above plan 

## 2018-02-05 DIAGNOSIS — E1140 Type 2 diabetes mellitus with diabetic neuropathy, unspecified: Secondary | ICD-10-CM | POA: Diagnosis not present

## 2018-02-05 DIAGNOSIS — Z79899 Other long term (current) drug therapy: Secondary | ICD-10-CM | POA: Diagnosis not present

## 2018-02-05 DIAGNOSIS — Z23 Encounter for immunization: Secondary | ICD-10-CM | POA: Diagnosis not present

## 2018-02-05 DIAGNOSIS — E7800 Pure hypercholesterolemia, unspecified: Secondary | ICD-10-CM | POA: Diagnosis not present

## 2018-02-06 DIAGNOSIS — E7800 Pure hypercholesterolemia, unspecified: Secondary | ICD-10-CM | POA: Diagnosis not present

## 2018-02-06 DIAGNOSIS — E1140 Type 2 diabetes mellitus with diabetic neuropathy, unspecified: Secondary | ICD-10-CM | POA: Diagnosis not present

## 2018-02-06 DIAGNOSIS — Z79899 Other long term (current) drug therapy: Secondary | ICD-10-CM | POA: Diagnosis not present

## 2018-02-06 DIAGNOSIS — Z23 Encounter for immunization: Secondary | ICD-10-CM | POA: Diagnosis not present

## 2018-02-28 NOTE — Progress Notes (Signed)
[facility] [address] [place]. [place] [address] [phone]       OFFICE FOLLOW UP NOTE  Mr. Marco Acevedo Date of Birth:  Aug 13, 1934 Medical Record Number:  [id number]   Reason for Referral:  stroke follow up  CHIEF COMPLAINT:  Chief Complaint  Patient presents with  . Follow-up    CVA follow up room 9 pt alone    HPI: Marco Acevedo is being seen today in the office for left MCA territory infarcts on 10/13/17. History obtained from patient, wife and chart review. Reviewed all radiology images and labs personally.  Mr. Marco Acevedo is a 83 y.o. male with history of remote tobacco use, obstructive sleep apnea (patient denies dx), obesity, hypertension, hyperlipidemia, gout, diabetes mellitus, COPD, claustrophobia, previous strokes by imaging, chronic kidney disease, congestive heart failure, and coronary artery disease with previous stents  who presented with right-sided weakness.  Patient initially was seen at [facility] and she is on vacation.  Per notes, CT of head was negative for acute findings.  At that time they chose to leave AMA and come to [facility] which is home for patient for further evaluation.  Per notes, he did receive baby aspirin prior to leaving the hospital. He did not receive IV t-PA due to late presentation.  MRI head reviewed and showed small acute left MCA territory infarcts.  MRA head and neck showed moderate to severe left ICA origin stenosis.  Carotid Doppler showed right ICA stenosis of 1 to 39% and left ICA stenosis of 40 to 59% with bilateral vertebral arteries demonstrate antegrade flow.  2D echo showed an EF of 55 to 60% without cardiac source of embolus.  LDL 115 and his patient was on Pravachol 40 mg PTA recommended Lipitor 80 mg daily.  A1c 7.0 and recommended tight glycemic control with close PCP follow-up for continued DM management.  Recommend DAPT with aspirin and Plavix for 3 weeks on Plavix only.  HTN stable during  admission and recommended long-term BP goal normotensive range.  Due to stenosis of left ICA, recommend further outpatient follow-up with vascular surgery for potential intervention.  Patient was discharged home in stable condition. On 10/19/2017, patient underwent left carotid enterectomy which was uneventful per notes.  Did have follow-up appointment on 11/13/2017 with Dr. Donnetta Hutching and denied new neurological deficits and per notes, reported that his hand is "99.9% back to normal".  Recommend resuming all previous activities as well as driving and repeat carotid duplex in 9 months.  11/30/2017 visit: Patient is being seen today for hospital stroke follow-up and is accompanied by his wife.  He denies any residual deficits and states he has been doing well.  He does have some residual numbness at incision site from carotid enterectomy but this has been improving.  He continues to take both aspirin and Plavix with mild bruising but no bleeding.  He states he was told to continue DAPT until he is seen by vascular surgery for repeat carotid duplex in 9 months.  Advised him that I will follow-up with vascular surgeon for duration of DAPT.  Continues to take Lipitor without side effects myalgias.  Blood pressure today satisfactory at 112/59.  Patient does monitor glucose levels at home but per patient, [facility] only allows glucose monitoring every 3 days.  He states in the hospital, they are fluctuating and is unable to adequately track levels due to being able to monitor only every 3 days.  He also has complaints of  neck pain with decreased range of motion which is a chronic complaint where he is followed by orthopedics for injections.  Denies radiculopathy down his arms or any other neurological complaint regarding neck pain.  When questioned about previous diagnosis of OSA, he states he had a sleep study in 2013 and was told he has mild apnea but does not believe this to be accurate as he had a difficult time sleeping during  the study.  He also states that he was drinking alcohol along with smoking at that time.  Patient declines repeat sleep study at this time.  Patient continues to stay active with previous activities and denies new or worsening stroke/TIA symptoms.  Interval history 03/01/2018: Patient is being seen today for 3 month follow up visit. He feels as though he has been doing well from a stroke standpoint. He continues to have neuropathy in BLE. He also endorses neck pain and stiffness which has been present over the past year. He does state he has had an MRI done with deterioration but no other abnormalities. He received spinal injections but no relief from injections. He does endorse worsening through the year. He continues to be followed by [place]. He denies prior use of therapy. Pain present on both sides with limited range of motion.  Denies pain in head or in shoulders and denies any radiculopathy into the arms or back.  He has tried muscle relaxer initially without benefit. He continues to take plavix without bleeding or bruising. Continues to take lipitor without side effects of myalgias. Blood pressure 125/63.  No further concerns at this time.  Denies new or worsening stroke/TIA symptoms.      ROS:   14 system review of systems performed and negative with exception of neck pain and neck stiffness  PMH:  Past Medical History:  Diagnosis Date  . Anemia    low iron  . Arthritis   . BPH (benign prostatic hyperplasia)   . Bruises easily   . CAD (coronary artery disease)    a. per Dr. Thompson Caul note, prior stenting of Cx in 2007 and 2012. b. DES to Cx in 09/2012.  [name] Chronic diastolic CHF (congestive heart failure) ([place])   . CKD (chronic kidney disease), stage [name] ([place])   . Claustrophobia   . Complication of anesthesia    has to have head elevated to keep from strangling on post nasal drip  . COPD (chronic obstructive pulmonary disease) ([place])   . Diabetes mellitus type II    type 2    . Diarrhea 2015   had for a year and a half  . Dyspnea   . GERD (gastroesophageal reflux disease)   . Gout   . Granuloma annulare   . History of hiatal hernia   . History of kidney stones    [place]rispy  . Hyperlipidemia   . Hypertension   . Neuropathy   . Obesity   . OSA (obstructive sleep apnea)   . Right sided weakness   . Stroke ([place]) 10/2017   Weakness right arm and leg  . Wears dentures    top  . Wears glasses     PSH:  Past Surgical History:  Procedure Laterality Date  . BLEPHAROPLASTY    . CARDIAC CATHETERIZATION  [phone]   X 2 stents  . CHOLECYSTECTOMY N/A 09/23/2014   Procedure: LAPAROSCOPIC CHOLECYSTECTOMY WITH INTRAOPERATIVE CHOLANGIOGRAM;  Surgeon: Autumn Messing [name], MD;  Location: [place];  Service: General;  Laterality: N/A;  . COLONOSCOPY    .  COLONOSCOPY WITH PROPOFOL N/A 03/22/2016   Procedure: COLONOSCOPY WITH PROPOFOL;  Surgeon: Wilford Corner, MD;  Location: [facility] ENDOSCOPY;  Service: Endoscopy;  Laterality: N/A;  . ENDARTERECTOMY Left 10/19/2017   Procedure: ENDARTERECTOMY CAROTID LEFT;  Surgeon: [name], MD;  Location: [facility] OR;  Service: Vascular;  Laterality: Left;  . ESOPHAGOGASTRODUODENOSCOPY (EGD) WITH PROPOFOL N/A 03/22/2016   Procedure: ESOPHAGOGASTRODUODENOSCOPY (EGD) WITH PROPOFOL;  Surgeon: Wilford Corner, MD;  Location: [facility] ENDOSCOPY;  Service: Endoscopy;  Laterality: N/A;  . EYE SURGERY     both cataracts  . KNEE ARTHROSCOPY WITH MEDIAL MENISECTOMY Right 08/19/2013   Procedure: RIGHT KNEE ARTHROSCOPY WITH PARTIAL MEDIAL MENISECTOMY AND CHONDROPLASTY;  Surgeon: Hessie Dibble, MD;  Location: [place];  Service: Orthopedics;  Laterality: Right;  . LEFT HEART CATH AND CORONARY ANGIOGRAPHY N/A 06/28/2016   Procedure: Left Heart Cath and Coronary Angiography;  Surgeon: Belva Crome, MD;  Location: [place] CV LAB;  Service: Cardiovascular;  Laterality: N/A;  . LEFT HEART CATHETERIZATION WITH CORONARY ANGIOGRAM N/A 10/03/2011    Procedure: LEFT HEART CATHETERIZATION WITH CORONARY ANGIOGRAM;  Surgeon: Sinclair Grooms, MD;  Location: [facility] CATH LAB;  Service: Cardiovascular;  Laterality: N/A;  . LEFT HEART CATHETERIZATION WITH CORONARY ANGIOGRAM N/A 09/12/2012   Procedure: LEFT HEART CATHETERIZATION WITH CORONARY ANGIOGRAM;  Surgeon: Sinclair Grooms, MD;  Location: [facility] CATH LAB;  Service: Cardiovascular;  Laterality: N/A;  . LIPOMA EXCISION     Biospy only; left parotid gland  . LITHOTRIPSY    . none    . PERCUTANEOUS CORONARY STENT INTERVENTION (PCI-S) N/A 09/17/2012   Procedure: PERCUTANEOUS CORONARY STENT INTERVENTION (PCI-S);  Surgeon: Sinclair Grooms, MD;  Location: [facility] CATH LAB;  Service: Cardiovascular;  Laterality: N/A;  . TRIGGER FINGER RELEASE Left 12/21/2015   Procedure: LEFT LONG FINGER TRIGGER RELEASE ;  Surgeon: Milly Jakob, MD;  Location: [place];  Service: Orthopedics;  Laterality: Left;    Social History:  Social History   Socioeconomic History  . Marital status: Married    Spouse name: Not on file  . Number of children: Not on file  . Years of education: Not on file  . Highest education level: Not on file  Occupational History  . Occupation: retired  Scientific laboratory technician  . Financial resource strain: Not on file  . Food insecurity:    Worry: Not on file    Inability: Not on file  . Transportation needs:    Medical: Not on file    Non-medical: Not on file  Tobacco Use  . Smoking status: Former Smoker    Packs/day: 1.00    Years: 64.00    Pack years: 64.00    Types: Cigarettes    Last attempt to quit: 05/29/2011    Years since quitting: 6.7  . Smokeless tobacco: Never Used  Substance and Sexual Activity  . Alcohol use: Yes    Comment: rarely  . Drug use: No  . Sexual activity: Not Currently  Lifestyle  . Physical activity:    Days per week: Not on file    Minutes per session: Not on file  . Stress: Not on file  Relationships  . Social connections:    Talks on  phone: Not on file    Gets together: Not on file    Attends religious service: Not on file    Active member of club or organization: Not on file    Attends meetings of clubs or organizations: Not  on file    Relationship status: Not on file  . Intimate partner violence:    Fear of current or ex partner: Not on file    Emotionally abused: Not on file    Physically abused: Not on file    Forced sexual activity: Not on file  Other Topics Concern  . Not on file  Social History Narrative   Denies Caffeine use     Family History:  Family History  Problem Relation Age of Onset  . Aortic aneurysm Father   . Aneurysm Mother        brain  . Cancer Brother   . COPD Sister   . Other Sister        health hx unknown  . Other Sister        health hx unknown  . Other Brother        health hx unknown    Medications:   Current Outpatient Medications on File Prior to Visit  Medication Sig Dispense Refill  . albuterol (PROVENTIL HFA) 108 (90 BASE) MCG/ACT inhaler Inhale 2 puffs into the lungs every 6 (six) hours as needed for wheezing or shortness of breath.     [name] albuterol (PROVENTIL) (2.5 MG/3ML) 0.083% nebulizer solution Take 2.5 mg by nebulization every 6 (six) hours as needed for wheezing or shortness of breath.     . allopurinol (ZYLOPRIM) 100 MG tablet Take 100 mg by mouth daily.    [name] amLODipine (NORVASC) 10 MG tablet Take 1 tablet (10 mg total) by mouth daily. 30 tablet 11  . atorvastatin (LIPITOR) 80 MG tablet Take 1 tablet (80 mg total) by mouth daily at 6 PM. 30 tablet 0  . clopidogrel (PLAVIX) 75 MG tablet Take 1 tablet (75 mg total) by mouth daily. 30 tablet 0  . docusate sodium (STOOL SOFTENER) 100 MG capsule Take 100 mg by mouth daily.    . Ferrous Sulfate (IRON) 325 (65 FE) MG TABS Take 1 tablet by mouth daily.    . finasteride (PROSCAR) 5 MG tablet Take 1 tablet (5 mg total) by mouth daily. 30 tablet 5  . glipiZIDE (GLUCOTROL) 5 MG tablet Take 5 mg by mouth 2 (two) times  daily before a meal.    . guaifenesin (HUMIBID E) 400 MG TABS tablet Take 400 mg by mouth at bedtime.     . isosorbide mononitrate (IMDUR) 120 MG 24 hr tablet Take 1 tablet (120 mg total) by mouth daily. 90 tablet 3  . nitroGLYCERIN (NITROSTAT) 0.4 MG SL tablet Place 0.4 mg under the tongue every 5 (five) minutes as needed for chest pain.    . primidone (MYSOLINE) 50 MG tablet Take 50 mg by mouth at bedtime.     . ranitidine (ZANTAC) 150 MG tablet Take 150 mg by mouth 2 (two) times daily.    . traMADol (ULTRAM) 50 MG tablet Take 1 tablet (50 mg total) by mouth at bedtime as needed for moderate pain.    . vitamin B-12 (CYANOCOBALAMIN) 1000 MCG tablet Take 1,000 mcg by mouth daily.     No current facility-administered medications on file prior to visit.     Allergies:   Allergies  Allergen Reactions  . Cimetidine Other (See Comments)    Anxiety Attacks     Physical Exam  Vitals:   03/01/18 1015  BP: 125/63  Pulse: 87  Weight: 203 lb (92.1 kg)   Body mass index is 30.87 kg/m. No exam data present  General: well developed, well  nourished, pleasant elderly Caucasian male, seated, in no evident distress Head: head normocephalic and atraumatic.   Neck: supple with no carotid or supraclavicular bruits Cardiovascular: regular rate and rhythm, no murmurs Musculoskeletal: no deformity; limited neck ROM with noted trapezius muscle tension R>L Skin:  no rash/petichiae Vascular:  Normal pulses all extremities  Neurologic Exam Mental Status: Awake and fully alert. Oriented to place and time. Recent and remote memory intact. Attention span, concentration and fund of knowledge appropriate. Mood and affect appropriate.  Cranial Nerves: Fundoscopic exam deferred. Pupils equal, briskly reactive to light. Extraocular movements full without nystagmus. Visual fields full to confrontation. Hearing intact. Facial sensation intact. Face, tongue, palate moves normally and symmetrically.  Motor:  Normal bulk and tone. Normal strength in all tested extremity muscles. Sensory.: intact to touch , pinprick , position and vibratory sensation.  Coordination: Rapid alternating movements normal in all extremities. Finger-to-nose and heel-to-shin performed accurately bilaterally. Gait and Station: Arises from chair without difficulty. Stance is normal. Gait demonstrates normal stride length and balance . Able to heel, toe and tandem walk without difficulty.  Reflexes: 1+ and symmetric. Toes downgoing.       Diagnostic Data (Labs, Imaging, Testing)  CT HEAD WO CONTRAST 10/17/2017 IMPRESSION: 1. Stable atrophy and moderate small vessel ischemic change. No extension of the MR demonstrated small left MCA infarct is seen and no hemorrhage is noted. 2. Small amount of fluid in the right frontal sinus.  MR BRAIN WO CONTRAST MR MRA HEAD  MR MRA NECK 10/13/2017 IMPRESSION: 1. Small acute left MCA territory infarcts. 2. Small chronic left MCA infarcts and moderate chronic small vessel ischemic white matter disease. 3. Motion degraded head MRA without large vessel occlusion or definite flow limiting proximal stenosis. 4. Limited neck MRA due to motion and noncontrast technique. Moderate to severe left ICA origin stenosis.  ECHOCARDIOGRAM 10/14/2017 Study Conclusions - Left ventricle: The cavity size was normal. There was mild   concentric hypertrophy. Systolic function was normal. The   estimated ejection fraction was in the range of 55% to 60%. Wall   motion was normal; there were no regional wall motion   abnormalities. Doppler parameters are consistent with abnormal   left ventricular relaxation (grade 1 diastolic dysfunction).   Doppler parameters are consistent with elevated ventricular   end-diastolic filling pressure. - Aortic valve: There was mild stenosis. There was no   regurgitation. - Left atrium: The atrium was mildly dilated. - Right ventricle: The cavity size was normal.  Wall thickness was   normal. Systolic function was normal. - Right atrium: The atrium was normal in size. - Tricuspid valve: There was no regurgitation. - Pulmonary arteries: Systolic pressure was within the normal   range. - Inferior vena cava: The vessel was normal in size. - Pericardium, extracardiac: A trivial pericardial effusion was   identified.  VAS US carotid duplex bilateral 10/15/2017 Final Interpretation: Right Carotid: Velocities in the right ICA are consistent with a 1-39% stenosis. Left Carotid: Velocities in the left ICA are consistent with a 40-59% stenosis. Vertebrals: Bilateral vertebral arteries demonstrate antegrade flow.   ASSESSMENT: CORDARRELL SANE is a 83 y.o. year old male here with small acute left MCA territory infarcts on 10/13/2017 secondary to embolic due to left internal carotid artery stenosis. Vascular risk factors include OSA (patient denies diagnosis), obesity, HTN, HLD, DM, CHF, CAD with stents and previous strokes.  Patient is being seen today for stroke follow-up and overall doing well from a stroke standpoint without residual  deficits the greatest complaint today is bilateral neck stiffness with limited range of motion which has been present for approximately 1 year.  (See HPI)  PLAN: -Continue clopidogrel 75 mg daily  and Lipitor for secondary stroke prevention -Possible cervicalgia -start Flexeril 10 mg 3 times daily as needed along with continuation of Tylenol PRN for pain with use of moist heat and also discussed proper posture and neck stretches.  We can consider possible trigger point injections or dry needling if use of muscle relaxant and conservative measures do not provide relief -F/u with PCP regarding your HLD, HTN, and DM management -continue to monitor BP at home -advised to continue to stay active and maintain a healthy diet -Maintain strict control of hypertension with blood pressure goal below 130/90, diabetes with hemoglobin A1c goal  below 6.5% and cholesterol with LDL cholesterol (bad cholesterol) goal below 70 mg/dL. I also advised the patient to eat a healthy diet with plenty of whole grains, cereals, fruits and vegetables, exercise regularly and maintain ideal body weight.  Follow up in 3 months or call earlier if needed   Greater than 50% of time during this 25 minute visit was spent on counseling,explanation of diagnosis of left MCA territory infarcts, reviewing risk factor management of HTN, HLD, DM, CHF and CAD, planning of further management, discussion with patient and family and coordination of care    Venancio Poisson, AGNP-BC  [facility] [address] [place], [place] [address]  Phone [phone] Fax [phone] Note: This document was prepared with digital dictation and possible smart phrase technology. Any transcriptional errors that result from this process are unintentional.

## 2018-03-01 ENCOUNTER — Encounter: Payer: Self-pay | Admitting: Adult Health

## 2018-03-01 ENCOUNTER — Ambulatory Visit (INDEPENDENT_AMBULATORY_CARE_PROVIDER_SITE_OTHER): Payer: Medicare Other | Admitting: Adult Health

## 2018-03-01 VITALS — BP 125/63 | HR 87 | Wt 203.0 lb

## 2018-03-01 DIAGNOSIS — E119 Type 2 diabetes mellitus without complications: Secondary | ICD-10-CM

## 2018-03-01 DIAGNOSIS — E785 Hyperlipidemia, unspecified: Secondary | ICD-10-CM | POA: Diagnosis not present

## 2018-03-01 DIAGNOSIS — I63512 Cerebral infarction due to unspecified occlusion or stenosis of left middle cerebral artery: Secondary | ICD-10-CM

## 2018-03-01 DIAGNOSIS — M542 Cervicalgia: Secondary | ICD-10-CM | POA: Diagnosis not present

## 2018-03-01 DIAGNOSIS — I10 Essential (primary) hypertension: Secondary | ICD-10-CM

## 2018-03-01 MED ORDER — CYCLOBENZAPRINE HCL 10 MG PO TABS: 10.0000 mg | ORAL_TABLET | Freq: Three times a day (TID) | ORAL | 0 refills | Status: DC | PRN

## 2018-03-01 NOTE — Patient Instructions (Addendum)
Continue clopidogrel 75 mg daily  and lipitor  for secondary stroke prevention  Cervicalgia - Start Flexeril 10mg  3 times daily as needed - trial this at night first. If you feel as though you would like to trial during the day as well, I would advise to break pill in half to ensure you can tolerate okay. I will speak to Dr. Jaynee Eagles reading trigger point injections vs dry needling and will call you on Monday with a plan. In the mean time, continue tylenol and start using moist heat on your neck  Continue to follow up with PCP regarding cholesterol, diabetes and blood pressure management   Continue to stay active and maintain a healthy diet  Continue to monitor blood pressure at home  Maintain strict control of hypertension with blood pressure goal below 130/90, diabetes with hemoglobin A1c goal below 6.5% and cholesterol with LDL cholesterol (bad cholesterol) goal below 70 mg/dL. I also advised the patient to eat a healthy diet with plenty of whole grains, cereals, fruits and vegetables, exercise regularly and maintain ideal body weight.  Followup in the future with me in 3 months or call earlier if needed       Thank you for coming to see [place] at [facility]. I hope we have been able to provide you high quality care today.  You may receive a patient satisfaction survey over the next few weeks. We would appreciate your feedback and comments so that we may continue to improve ourselves and the health of our patients.

## 2018-03-04 ENCOUNTER — Other Ambulatory Visit: Payer: Self-pay | Admitting: Adult Health

## 2018-03-04 DIAGNOSIS — M542 Cervicalgia: Secondary | ICD-10-CM

## 2018-03-04 NOTE — Progress Notes (Signed)
I agree with the above plan 

## 2018-03-04 NOTE — Progress Notes (Signed)
Order placed for dry needling with [facility] - Marco Acevedo, PT due to possible cervicalgia associated with pain and limited neck ROM.

## 2018-03-04 NOTE — Progress Notes (Signed)
Rn call patient that Janett Billow NP put orders in for dry needling to be done at outpatient therapy for approval. Rn stated to wait 3 to 5 business days for approval, and for an appt. Rn stated if he does not get a call by Wednesday from outpatient to call us back. PT verbalized understanding.

## 2018-03-15 DIAGNOSIS — I129 Hypertensive chronic kidney disease with stage 1 through stage 4 chronic kidney disease, or unspecified chronic kidney disease: Secondary | ICD-10-CM | POA: Diagnosis not present

## 2018-03-15 DIAGNOSIS — E1122 Type 2 diabetes mellitus with diabetic chronic kidney disease: Secondary | ICD-10-CM | POA: Diagnosis not present

## 2018-03-15 DIAGNOSIS — M109 Gout, unspecified: Secondary | ICD-10-CM | POA: Diagnosis not present

## 2018-03-15 DIAGNOSIS — N183 Chronic kidney disease, stage 3 (moderate): Secondary | ICD-10-CM | POA: Diagnosis not present

## 2018-03-18 ENCOUNTER — Other Ambulatory Visit: Payer: Self-pay

## 2018-03-18 ENCOUNTER — Encounter: Payer: Self-pay | Admitting: Physical Therapy

## 2018-03-18 ENCOUNTER — Ambulatory Visit: Payer: Medicare Other | Attending: Adult Health | Admitting: Physical Therapy

## 2018-03-18 DIAGNOSIS — M62838 Other muscle spasm: Secondary | ICD-10-CM | POA: Insufficient documentation

## 2018-03-18 DIAGNOSIS — M542 Cervicalgia: Secondary | ICD-10-CM | POA: Diagnosis not present

## 2018-03-18 NOTE — Therapy (Signed)
[place], [place], [address] Phone: [phone]   Fax:  [phone]  Physical Therapy Evaluation  Patient Details  Name: Marco Acevedo MRN: [id number] Date of Birth: 1935-03-30 Referring Provider (PT): Venancio Poisson NP   Encounter Date: 03/18/2018  PT End of Session - 03/18/18 1105    Visit Number  1    Number of Visits  8    Date for PT Re-Evaluation  04/15/18    PT Start Time  7867    PT Stop Time  1201    PT Time Calculation (min)  56 min    Activity Tolerance  Patient tolerated treatment well    Behavior During Therapy  [facility] for tasks assessed/performed       Past Medical History:  Diagnosis Date  . Anemia    low iron  . Arthritis   . BPH (benign prostatic hyperplasia)   . Bruises easily   . CAD (coronary artery disease)    a. per Dr. Thompson Caul note, prior stenting of Cx in 2007 and 2012. b. DES to Cx in 09/2012.  [name] Chronic diastolic CHF (congestive heart failure) ([place])   . CKD (chronic kidney disease), stage [name] ([place])   . Claustrophobia   . Complication of anesthesia    has to have head elevated to keep from strangling on post nasal drip  . COPD (chronic obstructive pulmonary disease) ([place])   . Diabetes mellitus type II    type 2  . Diarrhea 2015   had for a year and a half  . Dyspnea   . GERD (gastroesophageal reflux disease)   . Gout   . Granuloma annulare   . History of hiatal hernia   . History of kidney stones    Litthotrispy  . Hyperlipidemia   . Hypertension   . Neuropathy   . Obesity   . OSA (obstructive sleep apnea)   . Right sided weakness   . Stroke ([place]) 10/2017   Weakness right arm and leg  . Wears dentures    top  . Wears glasses     Past Surgical History:  Procedure Laterality Date  . BLEPHAROPLASTY    . CARDIAC CATHETERIZATION  [phone]   X 2 stents  . CHOLECYSTECTOMY N/A 09/23/2014   Procedure: LAPAROSCOPIC CHOLECYSTECTOMY WITH INTRAOPERATIVE  CHOLANGIOGRAM;  Surgeon: Autumn Messing [name], MD;  Location: [place];  Service: General;  Laterality: N/A;  . COLONOSCOPY    . COLONOSCOPY WITH PROPOFOL N/A 03/22/2016   Procedure: COLONOSCOPY WITH PROPOFOL;  Surgeon: Wilford Corner, MD;  Location: [facility] ENDOSCOPY;  Service: Endoscopy;  Laterality: N/A;  . ENDARTERECTOMY Left 10/19/2017   Procedure: ENDARTERECTOMY CAROTID LEFT;  Surgeon: [name], MD;  Location[facility] OR;  Service: Vascular;  Laterality: Left;  . ESOPHAGOGASTRODUODENOSCOPY (EGD) WITH PROPOFOL N/A 03/22/2016   Procedure: ESOPHAGOGASTRODUODENOSCOPY (EGD) WITH PROPOFOL;  Surgeon: Wilford Corner, MD;  Location: [facility] ENDOSCOPY;  Service: Endoscopy;  Laterality: N/A;  . EYE SURGERY     both cataracts  . KNEE ARTHROSCOPY WITH MEDIAL MENISECTOMY Right 08/19/2013   Procedure: RIGHT KNEE ARTHROSCOPY WITH PARTIAL MEDIAL MENISECTOMY AND CHONDROPLASTY;  Surgeon: Hessie Dibble, MD;  Location: [place];  Service: Orthopedics;  Laterality: Right;  . LEFT HEART CATH AND CORONARY ANGIOGRAPHY N/A 06/28/2016   Procedure: Left Heart Cath and Coronary Angiography;  Surgeon: Belva Crome, MD;  Location: [place] CV LAB;  Service: Cardiovascular;  Laterality: N/A;  . LEFT HEART CATHETERIZATION WITH CORONARY  ANGIOGRAM N/A 10/03/2011   Procedure: LEFT HEART CATHETERIZATION WITH CORONARY ANGIOGRAM;  Surgeon: Sinclair Grooms, MD;  Location: [facility] CATH LAB;  Service: Cardiovascular;  Laterality: N/A;  . LEFT HEART CATHETERIZATION WITH CORONARY ANGIOGRAM N/A 09/12/2012   Procedure: LEFT HEART CATHETERIZATION WITH CORONARY ANGIOGRAM;  Surgeon: Sinclair Grooms, MD;  Location: [facility] CATH LAB;  Service: Cardiovascular;  Laterality: N/A;  . LIPOMA EXCISION     Biospy only; left parotid gland  . LITHOTRIPSY    . none    . PERCUTANEOUS CORONARY STENT INTERVENTION (PCI-S) N/A 09/17/2012   Procedure: PERCUTANEOUS CORONARY STENT INTERVENTION (PCI-S);  Surgeon: Sinclair Grooms, MD;  Location: [facility] CATH LAB;   Service: Cardiovascular;  Laterality: N/A;  . TRIGGER FINGER RELEASE Left 12/21/2015   Procedure: LEFT LONG FINGER TRIGGER RELEASE ;  Surgeon: Milly Jakob, MD;  Location: [place];  Service: Orthopedics;  Laterality: Left;    There were no vitals filed for this visit.   Subjective Assessment - 03/18/18 1107    Subjective  Pt reports a 2 yr h/o neck pain, has seen multiple MDs. He has had injections that relieved the severe pain and now he continue with the muscle pain.      Diagnostic tests  xrays - degenerative changes, MRI - possible pinched nerve    Patient Stated Goals  decrease pain so hopefully have a better golf swing    Currently in Pain?  Yes    Pain Score  4     Pain Location  Neck    Pain Orientation  Right    Pain Descriptors / Indicators  Aching;Sore    Pain Type  Chronic pain    Aggravating Factors   looking up, sleep - unable to sleep on Rt side         [facility] PT Assessment - 03/18/18 0001      Assessment   Medical Diagnosis  cervicalgia    Referring Provider (PT)  Venancio Poisson NP    Onset Date/Surgical Date  03/18/16    Prior Therapy  none      Precautions   Precautions  None      Balance Screen   Has the patient fallen in the past 6 months  No      [place] residence    Living Arrangements  Spouse/significant other      Prior Function   Level of [place]  Retired    Leisure  golf      Observation/Other Assessments   Focus on Therapeutic Outcomes (FOTO)   45% limited      ROM / Strength   AROM / PROM / Strength  AROM;Strength      AROM   AROM Assessment Site  Shoulder;Cervical    Right/Left Shoulder  --   WNL   Cervical Flexion  27    Cervical Extension  12 with pain Rt neck    Cervical - Right Side Bend  20    Cervical - Left Side Bend  16 with pain    Cervical - Right Rotation  30    Cervical - Left Rotation  48      Strength   Strength  Assessment Site  Shoulder;Elbow    Right/Left Shoulder  --   = bilat except Rt ER 4/5   Right/Left Elbow  --   = bilat     Palpation   Spinal  mobility  NA at this time.     Palpation comment  very tight and tender in the Rt upper trap and levator, tightness in bilat cervical paraspinals and periocciput                Objective measurements completed on examination: See above findings.      [facility] Adult PT Treatment/Exercise - 03/18/18 0001      Exercises   Exercises  Neck      Neck Exercises: Seated   Neck Retraction  10 reps    W Back  10 reps    Shoulder Rolls  10 reps      Modalities   Modalities  Electrical Stimulation;Moist Heat      Moist Heat Therapy   Number Minutes Moist Heat  15 Minutes    Moist Heat Location  Cervical      Electrical Stimulation   Electrical Stimulation Location  cervical    Electrical Stimulation Action  IFC    Electrical Stimulation Parameters  to tolerance    Electrical Stimulation Goals  Tone;Pain      Manual Therapy   Manual Therapy  Soft tissue mobilization    Soft tissue mobilization  STM to Rt upper shoulder and neck in seated.              PT Education - 03/18/18 1247    Education Details  HEP and initial DN    Person(s) Educated  Patient    Methods  Explanation;Demonstration;Handout    Comprehension  Returned demonstration;Verbalized understanding          PT Long Term Goals - 03/18/18 1252      PT LONG TERM GOAL #1   Title  I with HEP for postural re-ed ( 04/15/18)     Time  4    Period  Weeks    Status  New    Target Date  04/15/18      PT LONG TERM GOAL #2   Title  improve cervical ROM to [facility] to allow pt to look for traffic easier ( 04/15/18)     Time  4    Period  Weeks    Status  New    Target Date  04/15/18      PT LONG TERM GOAL #3   Title  report =/> 75% reduction in neck pain and tightness to allow him to sleep on his Rt side ( 04/15/18)     Time  4    Period  Weeks    Status  New     Target Date  04/15/18      PT LONG TERM GOAL #4   Title  improve FOTO =/< 41% limited ( 04/15/18)     Time  4    Period  Weeks    Status  New    Target Date  04/15/18             Plan - 03/18/18 1248    Clinical Impression Statement  83 yo very active male presents with ~ 2 yr h/o neck pain and limitations.  He had injections that removed his severe pain however still has tightness and muscular pain. He is referred to PT to try DN to decrease muscular tightness and restore ROM.  He did not want this today however agreed to have it at the next visit.  He has limited cervical motion, slight weakness in the Rt shoulder and alot of tightness in the cervical and Rt upper shoulder  muscles. He had a mild CVA this summer and is not sure if that or the cervical tightness has decreased his golf swing. He requested to not be issued to much HEP     History and Personal Factors relevant to plan of care:  multiple medical dx    Clinical Presentation  Stable    Clinical Decision Making  Low    Rehab Potential  Good    PT Frequency  2x / week    PT Duration  4 weeks    PT Treatment/Interventions  Dry needling;Manual techniques;Moist Heat;Ultrasound;Patient/family education;Taping;Cryotherapy;Electrical Stimulation;Therapeutic exercise    PT Next Visit Plan  DN, Rt upper shoulder and neck, postural re-ed     Consulted and Agree with Plan of Care  Patient       Patient will benefit from skilled therapeutic intervention in order to improve the following deficits and impairments:  Pain, Postural dysfunction, Increased muscle spasms, Decreased range of motion, Decreased strength  Visit Diagnosis: Cervicalgia - Plan: PT plan of care cert/re-cert  Other muscle spasm - Plan: PT plan of care cert/re-cert     Problem List Patient Active Problem List   Diagnosis Date Noted  . Carotid artery stenosis, symptomatic, left 10/19/2017  . Right sided weakness   . Epigastric abdominal tenderness 03/22/2016   . Hx of colonic polyps 03/22/2016  . Status post surgery 09/23/2014  . Gallstones 09/23/2014  . Parotid mass   . Essential hypertension 05/29/2013  . CKD (chronic kidney disease) stage 3, GFR 30-59 ml/min (HCC) 09/13/2012    Class: Chronic  . Coronary artery disease involving native coronary artery of native heart with angina pectoris ([place]) 09/12/2012    Class: Acute  . Pure hypercholesterolemia 09/01/2012  . URI (upper respiratory infection) 03/05/2012  . COPD (chronic obstructive pulmonary disease) ([place]) 05/11/2011  . OSA (obstructive sleep apnea) 05/11/2011    Jeral Pinch PT 03/18/2018, 12:57 PM  [facility] [address] [place], [place], [address] Phone: [phone]   Fax:  [phone]  Name: PHEONIX CLINKSCALE MRN: [id number] Date of Birth: [birth year]

## 2018-03-18 NOTE — Patient Instructions (Signed)

## 2018-03-19 DIAGNOSIS — E119 Type 2 diabetes mellitus without complications: Secondary | ICD-10-CM | POA: Diagnosis not present

## 2018-03-19 DIAGNOSIS — M79641 Pain in right hand: Secondary | ICD-10-CM | POA: Diagnosis not present

## 2018-03-19 DIAGNOSIS — S6991XA Unspecified injury of right wrist, hand and finger(s), initial encounter: Secondary | ICD-10-CM | POA: Diagnosis not present

## 2018-03-19 DIAGNOSIS — S62619A Displaced fracture of proximal phalanx of unspecified finger, initial encounter for closed fracture: Secondary | ICD-10-CM | POA: Diagnosis not present

## 2018-03-21 ENCOUNTER — Ambulatory Visit: Payer: Medicare Other | Admitting: Physical Therapy

## 2018-03-27 ENCOUNTER — Ambulatory Visit: Payer: Medicare Other | Admitting: Physical Therapy

## 2018-03-27 ENCOUNTER — Encounter: Payer: Self-pay | Admitting: Physical Therapy

## 2018-03-27 DIAGNOSIS — M542 Cervicalgia: Secondary | ICD-10-CM | POA: Diagnosis not present

## 2018-03-27 DIAGNOSIS — M62838 Other muscle spasm: Secondary | ICD-10-CM | POA: Diagnosis not present

## 2018-03-27 NOTE — Therapy (Signed)
[place], [place], [address] Phone: [phone]   Fax:  [phone]  Physical Therapy Treatment  Patient Details  Name: Marco Acevedo MRN: [id number] Date of Birth: 1935/03/22 Referring Provider (PT): Venancio Poisson NP   Encounter Date: 03/27/2018  PT End of Session - 03/27/18 1347    Visit Number  2    Number of Visits  8    Date for PT Re-Evaluation  04/15/18    PT Start Time  3428    PT Stop Time  1053    PT Time Calculation (min)  38 min    Activity Tolerance  Patient tolerated treatment well    Behavior During Therapy  [facility] for tasks assessed/performed       Past Medical History:  Diagnosis Date  . Anemia    low iron  . Arthritis   . BPH (benign prostatic hyperplasia)   . Bruises easily   . CAD (coronary artery disease)    a. per Dr. Thompson Caul note, prior stenting of Cx in 2007 and 2012. b. DES to Cx in 09/2012.  [name] Chronic diastolic CHF (congestive heart failure) ([place])   . CKD (chronic kidney disease), stage [name] ([place])   . Claustrophobia   . Complication of anesthesia    has to have head elevated to keep from strangling on post nasal drip  . COPD (chronic obstructive pulmonary disease) ([place])   . Diabetes mellitus type II    type 2  . Diarrhea 2015   had for a year and a half  . Dyspnea   . GERD (gastroesophageal reflux disease)   . Gout   . Granuloma annulare   . History of hiatal hernia   . History of kidney stones    Litthotrispy  . Hyperlipidemia   . Hypertension   . Neuropathy   . Obesity   . OSA (obstructive sleep apnea)   . Right sided weakness   . Stroke ([place]) 10/2017   Weakness right arm and leg  . Wears dentures    top  . Wears glasses     Past Surgical History:  Procedure Laterality Date  . BLEPHAROPLASTY    . CARDIAC CATHETERIZATION  [phone]   X 2 stents  . CHOLECYSTECTOMY N/A 09/23/2014   Procedure: LAPAROSCOPIC CHOLECYSTECTOMY WITH INTRAOPERATIVE  CHOLANGIOGRAM;  Surgeon: Autumn Messing [name], MD;  Location: [place];  Service: General;  Laterality: N/A;  . COLONOSCOPY    . COLONOSCOPY WITH PROPOFOL N/A 03/22/2016   Procedure: COLONOSCOPY WITH PROPOFOL;  Surgeon: Wilford Corner, MD;  Location: [facility] ENDOSCOPY;  Service: Endoscopy;  Laterality: N/A;  . ENDARTERECTOMY Left 10/19/2017   Procedure: ENDARTERECTOMY CAROTID LEFT;  Surgeon: [name], MD;  Location: [facility] OR;  Service: Vascular;  Laterality: Left;  . ESOPHAGOGASTRODUODENOSCOPY (EGD) WITH PROPOFOL N/A 03/22/2016   Procedure: ESOPHAGOGASTRODUODENOSCOPY (EGD) WITH PROPOFOL;  Surgeon: Wilford Corner, MD;  Location: [facility] ENDOSCOPY;  Service: Endoscopy;  Laterality: N/A;  . EYE SURGERY     both cataracts  . KNEE ARTHROSCOPY WITH MEDIAL MENISECTOMY Right 08/19/2013   Procedure: RIGHT KNEE ARTHROSCOPY WITH PARTIAL MEDIAL MENISECTOMY AND CHONDROPLASTY;  Surgeon: Hessie Dibble, MD;  Location: [place];  Service: Orthopedics;  Laterality: Right;  . LEFT HEART CATH AND CORONARY ANGIOGRAPHY N/A 06/28/2016   Procedure: Left Heart Cath and Coronary Angiography;  Surgeon: Belva Crome, MD;  Location: [place] CV LAB;  Service: Cardiovascular;  Laterality: N/A;  . LEFT HEART CATHETERIZATION WITH CORONARY  ANGIOGRAM N/A 10/03/2011   Procedure: LEFT HEART CATHETERIZATION WITH CORONARY ANGIOGRAM;  Surgeon: Sinclair Grooms, MD;  Location: [facility] CATH LAB;  Service: Cardiovascular;  Laterality: N/A;  . LEFT HEART CATHETERIZATION WITH CORONARY ANGIOGRAM N/A 09/12/2012   Procedure: LEFT HEART CATHETERIZATION WITH CORONARY ANGIOGRAM;  Surgeon: Sinclair Grooms, MD;  Location: [facility] CATH LAB;  Service: Cardiovascular;  Laterality: N/A;  . LIPOMA EXCISION     Biospy only; left parotid gland  . LITHOTRIPSY    . none    . PERCUTANEOUS CORONARY STENT INTERVENTION (PCI-S) N/A 09/17/2012   Procedure: PERCUTANEOUS CORONARY STENT INTERVENTION (PCI-S);  Surgeon: Sinclair Grooms, MD;  Location: [facility] CATH LAB;   Service: Cardiovascular;  Laterality: N/A;  . TRIGGER FINGER RELEASE Left 12/21/2015   Procedure: LEFT LONG FINGER TRIGGER RELEASE ;  Surgeon: Milly Jakob, MD;  Location: [place];  Service: Orthopedics;  Laterality: Left;    There were no vitals filed for this visit.  Subjective Assessment - 03/27/18 1342    Subjective  Patient feels like his neck has improved. He feels like his neck mobility has improved. He had been working on his stretches but he recently had a fall.     Diagnostic tests  xrays - degenerative changes, MRI - possible pinched nerve    Patient Stated Goals  decrease pain so hopefully have a better golf swing    Currently in Pain?  Yes    Pain Score  2     Pain Location  Neck    Pain Orientation  Right    Pain Descriptors / Indicators  Aching    Pain Type  Chronic pain    Pain Onset  More than a month ago    Pain Frequency  Constant    Aggravating Factors   looking up; sleeping                        [facility] Adult PT Treatment/Exercise - 03/27/18 0001      Neck Exercises: Seated   Neck Retraction Limitations  2x10     W Back  10 reps    Shoulder Rolls  10 reps    Other Seated Exercise  10       Manual Therapy   Manual Therapy  Manual Traction;Joint mobilization    Joint Mobilization  PA mobilization at c4, c5, c6     Soft tissue mobilization  STM to Rt upper shoulder and neck in seated.; IASTYM to levator; cervical parapsinals, and upper trap; tripper point reelase in supine    Manual Traction  sub-occiptal release; manual traction;              PT Education - 03/27/18 1347    Education Details  HEP, symptom mangement     Person(s) Educated  Patient    Methods  Explanation;Demonstration;Handout    Comprehension  Verbalized understanding;Returned demonstration;Verbal cues required          PT Long Term Goals - 03/27/18 1354      PT LONG TERM GOAL #1   Title  I with HEP for postural re-ed ( 04/15/18)     Time  4     Period  Weeks    Status  On-going      PT LONG TERM GOAL #2   Title  improve cervical ROM to [facility] to allow pt to look for traffic easier ( 04/15/18)     Time  4  Period  Weeks    Status  On-going      PT LONG TERM GOAL #3   Title  report =/> 75% reduction in neck pain and tightness to allow him to sleep on his Rt side ( 04/15/18)     Time  4    Period  Weeks    Status  On-going      PT LONG TERM GOAL #4   Title  improve FOTO =/< 41% limited ( 04/15/18)     Time  4    Period  Weeks    Status  On-going            Plan - 03/27/18 1349    Clinical Impression Statement  Patient reported decreased stiffness after treatment. He declined dry needling because he feels like he is getting better wihtout it. He tolerated exercises well. Therapy focsued on manual therapy     Rehab Potential  Good    PT Frequency  2x / week    PT Duration  4 weeks    PT Treatment/Interventions  Dry needling;Manual techniques;Moist Heat;Ultrasound;Patient/family education;Taping;Cryotherapy;Electrical Stimulation;Therapeutic exercise    PT Next Visit Plan  May do DN if psain continues; adavance to band scapular work as needed.     Consulted and Agree with Plan of Care  Patient       Patient will benefit from skilled therapeutic intervention in order to improve the following deficits and impairments:  Pain, Postural dysfunction, Increased muscle spasms, Decreased range of motion, Decreased strength  Visit Diagnosis: Cervicalgia  Other muscle spasm     Problem List Patient Active Problem List   Diagnosis Date Noted  . Carotid artery stenosis, symptomatic, left 10/19/2017  . Right sided weakness   . Epigastric abdominal tenderness 03/22/2016  . Hx of colonic polyps 03/22/2016  . Status post surgery 09/23/2014  . Gallstones 09/23/2014  . Parotid mass   . Essential hypertension 05/29/2013  . CKD (chronic kidney disease) stage 3, GFR 30-59 ml/min (HCC) 09/13/2012    Class: Chronic  . Coronary  artery disease involving native coronary artery of native heart with angina pectoris ([place]) 09/12/2012    Class: Acute  . Pure hypercholesterolemia 09/01/2012  . URI (upper respiratory infection) 03/05/2012  . COPD (chronic obstructive pulmonary disease) ([place]) 05/11/2011  . OSA (obstructive sleep apnea) 05/11/2011    Carney Living  PT DPT  03/27/2018, 1:57 PM  [facility] [address] [place], [place], [address] Phone: [phone]   Fax:  [phone]  Name: Marco Acevedo MRN: [id number] Date of Birth: 1934-06-02

## 2018-03-29 ENCOUNTER — Ambulatory Visit: Payer: Medicare Other | Admitting: Physical Therapy

## 2018-03-29 DIAGNOSIS — M62838 Other muscle spasm: Secondary | ICD-10-CM

## 2018-03-29 DIAGNOSIS — M542 Cervicalgia: Secondary | ICD-10-CM

## 2018-03-29 NOTE — Therapy (Addendum)
[facility] [place], [place], [address] Phone: [phone]   Fax:  [phone]  Physical Therapy Treatment/Discharge  Patient Details  Name: Marco Acevedo MRN: [id number] Date of Birth: 1934-11-05 Referring Provider (PT): Venancio Poisson NP   Encounter Date: 03/29/2018  PT End of Session - 03/29/18 1024    Visit Number  3    Number of Visits  8    Date for PT Re-Evaluation  04/15/18    PT Start Time  1016    PT Stop Time  1050    PT Time Calculation (min)  34 min       Past Medical History:  Diagnosis Date  . Anemia    low iron  . Arthritis   . BPH (benign prostatic hyperplasia)   . Bruises easily   . CAD (coronary artery disease)    a. per Dr. Thompson Caul note, prior stenting of Cx in 2007 and 2012. b. DES to Cx in 09/2012.  [name] Chronic diastolic CHF (congestive heart failure) ([facility])   . CKD (chronic kidney disease), stage [name] ([place])   . Claustrophobia   . Complication of anesthesia    has to have head elevated to keep from strangling on post nasal drip  . COPD (chronic obstructive pulmonary disease) ([place])   . Diabetes mellitus type II    type 2  . Diarrhea 2015   had for a year and a half  . Dyspnea   . GERD (gastroesophageal reflux disease)   . Gout   . Granuloma annulare   . History of hiatal hernia   . History of kidney stones    Litthotrispy  . Hyperlipidemia   . Hypertension   . Neuropathy   . Obesity   . OSA (obstructive sleep apnea)   . Right sided weakness   . Stroke ([place]) 10/2017   Weakness right arm and leg  . Wears dentures    top  . Wears glasses     Past Surgical History:  Procedure Laterality Date  . BLEPHAROPLASTY    . CARDIAC CATHETERIZATION  [phone]   X 2 stents  . CHOLECYSTECTOMY N/A 09/23/2014   Procedure: LAPAROSCOPIC CHOLECYSTECTOMY WITH INTRAOPERATIVE CHOLANGIOGRAM;  Surgeon: Autumn Messing [name], MD;  Location: [facility];  Service: General;  Laterality: N/A;  . COLONOSCOPY     . COLONOSCOPY WITH PROPOFOL N/A 03/22/2016   Procedure: COLONOSCOPY WITH PROPOFOL;  Surgeon: Wilford Corner, MD;  Location: [facility] ENDOSCOPY;  Service: Endoscopy;  Laterality: N/A;  . ENDARTERECTOMY Left 10/19/2017   Procedure: ENDARTERECTOMY CAROTID LEFT;  Surgeon: [name], MD;  Location: [facility] OR;  Service: Vascular;  Laterality: Left;  . ESOPHAGOGASTRODUODENOSCOPY (EGD) WITH PROPOFOL N/A 03/22/2016   Procedure: ESOPHAGOGASTRODUODENOSCOPY (EGD) WITH PROPOFOL;  Surgeon: Wilford Corner, MD;  Location: [facility] ENDOSCOPY;  Service: Endoscopy;  Laterality: N/A;  . EYE SURGERY     both cataracts  . KNEE ARTHROSCOPY WITH MEDIAL MENISECTOMY Right 08/19/2013   Procedure: RIGHT KNEE ARTHROSCOPY WITH PARTIAL MEDIAL MENISECTOMY AND CHONDROPLASTY;  Surgeon: Hessie Dibble, MD;  Location: [facility];  Service: Orthopedics;  Laterality: Right;  . LEFT HEART CATH AND CORONARY ANGIOGRAPHY N/A 06/28/2016   Procedure: Left Heart Cath and Coronary Angiography;  Surgeon: Belva Crome, MD;  Location: [place] CV LAB;  Service: Cardiovascular;  Laterality: N/A;  . LEFT HEART CATHETERIZATION WITH CORONARY ANGIOGRAM N/A 10/03/2011   Procedure: LEFT HEART CATHETERIZATION WITH CORONARY ANGIOGRAM;  Surgeon: Sinclair Grooms, MD;  Location:  [facility] CATH LAB;  Service: Cardiovascular;  Laterality: N/A;  . LEFT HEART CATHETERIZATION WITH CORONARY ANGIOGRAM N/A 09/12/2012   Procedure: LEFT HEART CATHETERIZATION WITH CORONARY ANGIOGRAM;  Surgeon: Sinclair Grooms, MD;  Location: [facility] CATH LAB;  Service: Cardiovascular;  Laterality: N/A;  . LIPOMA EXCISION     Biospy only; left parotid gland  . LITHOTRIPSY    . none    . PERCUTANEOUS CORONARY STENT INTERVENTION (PCI-S) N/A 09/17/2012   Procedure: PERCUTANEOUS CORONARY STENT INTERVENTION (PCI-S);  Surgeon: Sinclair Grooms, MD;  Location: [facility] CATH LAB;  Service: Cardiovascular;  Laterality: N/A;  . TRIGGER FINGER RELEASE Left 12/21/2015   Procedure: LEFT LONG FINGER  TRIGGER RELEASE ;  Surgeon: Milly Jakob, MD;  Location: [facility];  Service: Orthopedics;  Laterality: Left;    There were no vitals filed for this visit.                    [facility] Adult PT Treatment/Exercise - 03/29/18 0001      Neck Exercises: Theraband   Shoulder Extension  15 reps;Green   2 sets   Rows  Green;15 reps    Rows Limitations  2 sets      Neck Exercises: Seated   Neck Retraction  10 reps    W Back  10 reps    Shoulder Rolls  10 reps    Other Seated Exercise  scap retract x 10      Manual Therapy   Soft tissue mobilization  STM to Rt/Lt upper shoulder and neck in seated.       Neck Exercises: Stretches   Corner Stretch  3 reps;20 seconds                  PT Long Term Goals - 03/27/18 1354      PT LONG TERM GOAL #1   Title  I with HEP for postural re-ed ( 04/15/18)     Time  4    Period  Weeks    Status  On-going      PT LONG TERM GOAL #2   Title  improve cervical ROM to [facility] to allow pt to look for traffic easier ( 04/15/18)     Time  4    Period  Weeks    Status  On-going      PT LONG TERM GOAL #3   Title  report =/> 75% reduction in neck pain and tightness to allow him to sleep on his Rt side ( 04/15/18)     Time  4    Period  Weeks    Status  On-going      PT LONG TERM GOAL #4   Title  improve FOTO =/< 41% limited ( 04/15/18)     Time  4    Period  Weeks    Status  On-going            Plan - 03/29/18 1024    Clinical Impression Statement  Pt reports neck has been catching since last vist. He fell after first visit and has not performed HEP. Reviewed initial HEP and progressed to scapular bands for HEP. Focused soft tissue work to upper traps/ periscap. Right side more tight. Tissue sotened post soft tissue work and pain decreased from 6/10 to 1/10 in right neck. He plans to start silver sneakers.     PT Next Visit Plan  May do DN if pain continues; adavance to band scapular work  as needed.     PT Home  Exercise Plan  scap squeeze, chin tucks, ER and scap retraction together, Standing green band row and extension     Consulted and Agree with Plan of Care  Patient       Patient will benefit from skilled therapeutic intervention in order to improve the following deficits and impairments:  Pain, Postural dysfunction, Increased muscle spasms, Decreased range of motion, Decreased strength  Visit Diagnosis: Cervicalgia  Other muscle spasm     Problem List Patient Active Problem List   Diagnosis Date Noted  . Carotid artery stenosis, symptomatic, left 10/19/2017  . Right sided weakness   . Epigastric abdominal tenderness 03/22/2016  . Hx of colonic polyps 03/22/2016  . Status post surgery 09/23/2014  . Gallstones 09/23/2014  . Parotid mass   . Essential hypertension 05/29/2013  . CKD (chronic kidney disease) stage 3, GFR 30-59 ml/min (H[facility]) 09/13/2012    Class: Chronic  . Coronary artery disease involving native coronary artery of native heart with angina pectoris ([place]) 09/12/2012    Class: Acute  . Pure hypercholesterolemia 09/01/2012  . URI (upper respiratory infection) 03/05/2012  . COPD (chronic obstructive pulmonary disease) ([place]) 05/11/2011  . OSA (obstructive sleep apnea) 05/11/2011    Dorene Ar, PTA 03/29/2018, 10:57 AM  [facility] [address] [place], [place], [address] Phone: [phone]   Fax:  [phone]  Name: IVOR KISHI MRN: [id number] Date of Birth: [birth year]    PHYSICAL THERAPY DISCHARGE SUMMARY  Visits from Start of Care:3  Current functional level related to goals / functional outcomes: Unknown, patient has not returned for therapy   Remaining deficits: unknown   Education / Equipment: Initial HEP Plan:                                                    Patient goals were not met. Patient is being discharged due to not returning since the last visit.  ?????     Jeral Pinch, PT 05/08/18 8:06 AM

## 2018-04-17 DIAGNOSIS — M5412 Radiculopathy, cervical region: Secondary | ICD-10-CM | POA: Diagnosis not present

## 2018-04-17 DIAGNOSIS — M7072 Other bursitis of hip, left hip: Secondary | ICD-10-CM | POA: Diagnosis not present

## 2018-04-30 DIAGNOSIS — L820 Inflamed seborrheic keratosis: Secondary | ICD-10-CM | POA: Diagnosis not present

## 2018-05-10 DIAGNOSIS — N183 Chronic kidney disease, stage 3 (moderate): Secondary | ICD-10-CM | POA: Diagnosis not present

## 2018-05-10 DIAGNOSIS — M79673 Pain in unspecified foot: Secondary | ICD-10-CM | POA: Diagnosis not present

## 2018-05-10 DIAGNOSIS — E1140 Type 2 diabetes mellitus with diabetic neuropathy, unspecified: Secondary | ICD-10-CM | POA: Diagnosis not present

## 2018-05-10 DIAGNOSIS — I10 Essential (primary) hypertension: Secondary | ICD-10-CM | POA: Diagnosis not present

## 2018-05-10 DIAGNOSIS — M7710 Lateral epicondylitis, unspecified elbow: Secondary | ICD-10-CM | POA: Diagnosis not present

## 2018-05-14 DIAGNOSIS — M79601 Pain in right arm: Secondary | ICD-10-CM | POA: Diagnosis not present

## 2018-05-14 DIAGNOSIS — M79671 Pain in right foot: Secondary | ICD-10-CM | POA: Diagnosis not present

## 2018-05-14 DIAGNOSIS — M79672 Pain in left foot: Secondary | ICD-10-CM | POA: Diagnosis not present

## 2018-05-14 DIAGNOSIS — M79602 Pain in left arm: Secondary | ICD-10-CM | POA: Diagnosis not present

## 2018-05-21 DIAGNOSIS — M722 Plantar fascial fibromatosis: Secondary | ICD-10-CM | POA: Diagnosis not present

## 2018-05-21 DIAGNOSIS — M7732 Calcaneal spur, left foot: Secondary | ICD-10-CM | POA: Diagnosis not present

## 2018-05-28 DIAGNOSIS — L600 Ingrowing nail: Secondary | ICD-10-CM | POA: Diagnosis not present

## 2018-05-28 DIAGNOSIS — M722 Plantar fascial fibromatosis: Secondary | ICD-10-CM | POA: Diagnosis not present

## 2018-05-28 DIAGNOSIS — M7072 Other bursitis of hip, left hip: Secondary | ICD-10-CM | POA: Diagnosis not present

## 2018-06-14 ENCOUNTER — Ambulatory Visit: Payer: Medicare Other | Admitting: Adult Health

## 2018-06-14 DIAGNOSIS — M7138 Other bursal cyst, other site: Secondary | ICD-10-CM | POA: Diagnosis not present

## 2018-06-14 DIAGNOSIS — M47817 Spondylosis without myelopathy or radiculopathy, lumbosacral region: Secondary | ICD-10-CM | POA: Diagnosis not present

## 2018-06-14 DIAGNOSIS — M5126 Other intervertebral disc displacement, lumbar region: Secondary | ICD-10-CM | POA: Diagnosis not present

## 2018-06-17 DIAGNOSIS — M722 Plantar fascial fibromatosis: Secondary | ICD-10-CM | POA: Diagnosis not present

## 2018-06-18 DIAGNOSIS — M533 Sacrococcygeal disorders, not elsewhere classified: Secondary | ICD-10-CM | POA: Diagnosis not present

## 2018-08-02 DIAGNOSIS — N184 Chronic kidney disease, stage 4 (severe): Secondary | ICD-10-CM | POA: Diagnosis not present

## 2018-08-05 DIAGNOSIS — E1140 Type 2 diabetes mellitus with diabetic neuropathy, unspecified: Secondary | ICD-10-CM | POA: Diagnosis not present

## 2018-08-05 DIAGNOSIS — N400 Enlarged prostate without lower urinary tract symptoms: Secondary | ICD-10-CM | POA: Diagnosis not present

## 2018-08-05 DIAGNOSIS — I10 Essential (primary) hypertension: Secondary | ICD-10-CM | POA: Diagnosis not present

## 2018-08-05 DIAGNOSIS — N183 Chronic kidney disease, stage 3 (moderate): Secondary | ICD-10-CM | POA: Diagnosis not present

## 2018-08-05 DIAGNOSIS — I2510 Atherosclerotic heart disease of native coronary artery without angina pectoris: Secondary | ICD-10-CM | POA: Diagnosis not present

## 2018-08-06 DIAGNOSIS — I129 Hypertensive chronic kidney disease with stage 1 through stage 4 chronic kidney disease, or unspecified chronic kidney disease: Secondary | ICD-10-CM | POA: Diagnosis not present

## 2018-08-06 DIAGNOSIS — N183 Chronic kidney disease, stage 3 (moderate): Secondary | ICD-10-CM | POA: Diagnosis not present

## 2018-08-06 DIAGNOSIS — M109 Gout, unspecified: Secondary | ICD-10-CM | POA: Diagnosis not present

## 2018-08-06 DIAGNOSIS — E1122 Type 2 diabetes mellitus with diabetic chronic kidney disease: Secondary | ICD-10-CM | POA: Diagnosis not present

## 2018-08-07 ENCOUNTER — Telehealth: Payer: Self-pay

## 2018-08-07 NOTE — Telephone Encounter (Signed)
I called patient that his visit will be change to doxy video visit. I verified his email. He has a lap top that has a camera and video on it. Pt can access his email from the lap top. I receive verbal consent to do video and file insurance. PTs medications and PCP were updated. I stated he will get a email the day before with a link that he will click on to join meeting. I stated it will ask him to type his name in and he will be on the video. I also advise to click on the link 10 minutes prior to appt.Pt stated he has been having a lot of pain and went to the referral office and was seen. He has seen his foot doctor and kidney doctor and they are stating it could be neurology. Pt stated he just wants JEssica to find out why is he in pain. I explain Janett Billow NP can discuss it on Tuesday. Pt verbalized understanding.

## 2018-08-12 NOTE — Progress Notes (Signed)
[facility] [address] [place]. [place] [address] [phone]       FOLLOW UP NOTE  Mr. Marco Acevedo Date of Birth:  07-15-1934 Medical Record Number:  [id number]   Reason for Referral:  stroke follow up  Virtual Visit via Video Note  I connected with Marco Acevedo on 08/13/18 at  8:45 AM EDT by a video enabled telemedicine application located remotely in my own home and verified that I am speaking with the correct person using two identifiers who was located at their own home.   I discussed the limitations of evaluation and management by telemedicine and the availability of in person appointments. The patient expressed understanding and agreed to proceed.    CHIEF COMPLAINT:  Chief Complaint  Patient presents with  . Follow-up    Stroke follow-up; greatest concern today ongoing sciatica pain    HPI:   Marco Acevedo is a 84 y.o. male with underlying medical history of tobacco use, OSA, obesity, HTN, HLD, DM, COPD, prior strokes by imaging, CKD, CHF, CAD s/p stents and left MCA territory infarcts secondary to left ICA stenosis s/p left carotid endarterectomy in 10/2017.  He was initially scheduled today for a face-to-face stroke follow-up visit but due to COVID-19 safety precautions, visit transitioned to telemedicine via [email or web address] with patient's consent. He has been stable from a stroke standpoint without residual deficits or reoccurring/new symptoms.  He continues on clopidogrel 75 mg daily and atorvastatin without reported side effects.  Blood pressure stable.  Recent A1c 7.3 and was started on Jardiance by PCP with improvement of his glucose levels.  Currently in the process with working with [place] to approve this medication as he was previously using samples.  Will be evaluated by vascular surgery for surveillance monitoring of left ICA around 09/2018.   At prior visit, he was referred to PT for cervicalgia.  He benefited from PT and only will have dull  occasional stiffness in his neck.  He has been recently dealing with worsening left-sided sciatica pain and has been receiving injections by [facility] where he will be receiving his fourth injection today.  He does endorse mild benefit with use of injections but will only last for approximately 1.5 weeks and then will return.  MRI spine lumbar on 06/17/2018 showed mild bulging of the disc at L3-4 associated with moderate spinal stenosis along with moderate facet joint arthritis L5-S1 with small synovial cyst projecting into the neural foramina but no significant nerve root impingement.  He states at times while he was walking, he feels as though he stepping on glass as his sciatic pain runs all the way down the nerve root in his left leg to his foot along with numbness/tingling.  Pain has been present since fall 2019.  He denies participating in therapy for his sciatic pain.  He has trialed gabapentin in the past but patient reports this was discontinued due to his kidneys.  He does continue to follow with [facility].     ROS:   14 system review of systems performed and negative with exception of pain  PMH:  Past Medical History:  Diagnosis Date  . Anemia    low iron  . Arthritis   . BPH (benign prostatic hyperplasia)   . Bruises easily   . CAD (coronary artery disease)    a. per Dr. Thompson Caul note, prior stenting of Cx in 2007 and 2012. b. DES to Cx in 09/2012.  [name] Chronic diastolic CHF (  congestive heart failure) ([place])   . CKD (chronic kidney disease), stage [name] ([place])   . Claustrophobia   . Complication of anesthesia    has to have head elevated to keep from strangling on post nasal drip  . COPD (chronic obstructive pulmonary disease) ([place])   . Diabetes mellitus type II    type 2  . Diarrhea 2015   had for a year and a half  . Dyspnea   . GERD (gastroesophageal reflux disease)   . Gout   . Granuloma annulare   . History of hiatal hernia   . History of kidney  stones    Litthotrispy  . Hyperlipidemia   . Hypertension   . Neuropathy   . Obesity   . OSA (obstructive sleep apnea)   . Right sided weakness   . Stroke ([place]) 10/2017   Weakness right arm and leg  . Wears dentures    top  . Wears glasses     PSH:  Past Surgical History:  Procedure Laterality Date  . BLEPHAROPLASTY    . CARDIAC CATHETERIZATION  [phone]   X 2 stents  . CHOLECYSTECTOMY N/A 09/23/2014   Procedure: LAPAROSCOPIC CHOLECYSTECTOMY WITH INTRAOPERATIVE CHOLANGIOGRAM;  Surgeon: Autumn Messing [name], MD;  Location: [place];  Service: General;  Laterality: N/A;  . COLONOSCOPY    . COLONOSCOPY WITH PROPOFOL N/A 03/22/2016   Procedure: COLONOSCOPY WITH PROPOFOL;  Surgeon: Wilford Corner, MD;  Location: [facility] ENDOSCOPY;  Service: Endoscopy;  Laterality: N/A;  . ENDARTERECTOMY Left 10/19/2017   Procedure: ENDARTERECTOMY CAROTID LEFT;  Surgeon: [name], MD;  Location: [facility] OR;  Service: Vascular;  Laterality: Left;  . ESOPHAGOGASTRODUODENOSCOPY (EGD) WITH PROPOFOL N/A 03/22/2016   Procedure: ESOPHAGOGASTRODUODENOSCOPY (EGD) WITH PROPOFOL;  Surgeon: Wilford Corner, MD;  Location: [facility] ENDOSCOPY;  Service: Endoscopy;  Laterality: N/A;  . EYE SURGERY     both cataracts  . KNEE ARTHROSCOPY WITH MEDIAL MENISECTOMY Right 08/19/2013   Procedure: RIGHT KNEE ARTHROSCOPY WITH PARTIAL MEDIAL MENISECTOMY AND CHONDROPLASTY;  Surgeon: Hessie Dibble, MD;  Location: [place];  Service: Orthopedics;  Laterality: Right;  . LEFT HEART CATH AND CORONARY ANGIOGRAPHY N/A 06/28/2016   Procedure: Left Heart Cath and Coronary Angiography;  Surgeon: Belva Crome, MD;  Location: [place] CV LAB;  Service: Cardiovascular;  Laterality: N/A;  . LEFT HEART CATHETERIZATION WITH CORONARY ANGIOGRAM N/A 10/03/2011   Procedure: LEFT HEART CATHETERIZATION WITH CORONARY ANGIOGRAM;  Surgeon: Sinclair Grooms, MD;  Location: [facility] CATH LAB;  Service: Cardiovascular;  Laterality: N/A;  . LEFT HEART  CATHETERIZATION WITH CORONARY ANGIOGRAM N/A 09/12/2012   Procedure: LEFT HEART CATHETERIZATION WITH CORONARY ANGIOGRAM;  Surgeon: Sinclair Grooms, MD;  Location: [facility] CATH LAB;  Service: Cardiovascular;  Laterality: N/A;  . LIPOMA EXCISION     Biospy only; left parotid gland  . LITHOTRIPSY    . none    . PERCUTANEOUS CORONARY STENT INTERVENTION (PCI-S) N/A 09/17/2012   Procedure: PERCUTANEOUS CORONARY STENT INTERVENTION (PCI-S);  Surgeon: Sinclair Grooms, MD;  Location: [facility] CATH LAB;  Service: Cardiovascular;  Laterality: N/A;  . TRIGGER FINGER RELEASE Left 12/21/2015   Procedure: LEFT LONG FINGER TRIGGER RELEASE ;  Surgeon: Milly Jakob, MD;  Location: [place];  Service: Orthopedics;  Laterality: Left;    Social History:  Social History   Socioeconomic History  . Marital status: Married    Spouse name: Not on file  . Number of children: Not on file  . Years  of education: Not on file  . Highest education level: Not on file  Occupational History  . Occupation: retired  Scientific laboratory technician  . Financial resource strain: Not on file  . Food insecurity:    Worry: Not on file    Inability: Not on file  . Transportation needs:    Medical: Not on file    Non-medical: Not on file  Tobacco Use  . Smoking status: Former Smoker    Packs/day: 1.00    Years: 64.00    Pack years: 64.00    Types: Cigarettes    Last attempt to quit: 05/29/2011    Years since quitting: 7.2  . Smokeless tobacco: Never Used  Substance and Sexual Activity  . Alcohol use: Yes    Comment: rarely  . Drug use: No  . Sexual activity: Not Currently  Lifestyle  . Physical activity:    Days per week: Not on file    Minutes per session: Not on file  . Stress: Not on file  Relationships  . Social connections:    Talks on phone: Not on file    Gets together: Not on file    Attends religious service: Not on file    Active member of club or organization: Not on file    Attends meetings of clubs or  organizations: Not on file    Relationship status: Not on file  . Intimate partner violence:    Fear of current or ex partner: Not on file    Emotionally abused: Not on file    Physically abused: Not on file    Forced sexual activity: Not on file  Other Topics Concern  . Not on file  Social History Narrative   Denies Caffeine use     Family History:  Family History  Problem Relation Age of Onset  . Aortic aneurysm Father   . Aneurysm Mother        brain  . Cancer Brother   . COPD Sister   . Other Sister        health hx unknown  . Other Sister        health hx unknown  . Other Brother        health hx unknown    Medications:   Current Outpatient Medications on File Prior to Visit  Medication Sig Dispense Refill  . albuterol (PROVENTIL HFA) 108 (90 BASE) MCG/ACT inhaler Inhale 2 puffs into the lungs every 6 (six) hours as needed for wheezing or shortness of breath.     [name] albuterol (PROVENTIL) (2.5 MG/3ML) 0.083% nebulizer solution Take 2.5 mg by nebulization every 6 (six) hours as needed for wheezing or shortness of breath.     . allopurinol (ZYLOPRIM) 100 MG tablet Take 100 mg by mouth daily.    [name] amLODipine (NORVASC) 10 MG tablet Take 1 tablet (10 mg total) by mouth daily. 30 tablet 11  . atorvastatin (LIPITOR) 80 MG tablet Take 1 tablet (80 mg total) by mouth daily at 6 PM. 30 tablet 0  . clopidogrel (PLAVIX) 75 MG tablet Take 1 tablet (75 mg total) by mouth daily. 30 tablet 0  . docusate sodium (STOOL SOFTENER) 100 MG capsule Take 100 mg by mouth daily.    . Ferrous Sulfate (IRON) 325 (65 FE) MG TABS Take 1 tablet by mouth daily.    . finasteride (PROSCAR) 5 MG tablet Take 1 tablet (5 mg total) by mouth daily. 30 tablet 5  . glipiZIDE (GLUCOTROL) 5 MG tablet Take  5 mg by mouth 2 (two) times daily before a meal.    . guaifenesin (HUMIBID E) 400 MG TABS tablet Take 400 mg by mouth at bedtime.     . isosorbide mononitrate (IMDUR) 120 MG 24 hr tablet Take 1 tablet (120 mg  total) by mouth daily. 90 tablet 3  . nitroGLYCERIN (NITROSTAT) 0.4 MG SL tablet Place 0.4 mg under the tongue every 5 (five) minutes as needed for chest pain.    . primidone (MYSOLINE) 50 MG tablet Take 50 mg by mouth at bedtime.     . ranitidine (ZANTAC) 150 MG tablet Take 150 mg by mouth 2 (two) times daily.    . traMADol (ULTRAM) 50 MG tablet Take 1 tablet (50 mg total) by mouth at bedtime as needed for moderate pain.    . vitamin B-12 (CYANOCOBALAMIN) 1000 MCG tablet Take 1,000 mcg by mouth daily.     No current facility-administered medications on file prior to visit.     Allergies:   Allergies  Allergen Reactions  . Cimetidine Other (See Comments)    Anxiety Attacks     Physical Exam  General: well developed, well nourished, pleasant elderly Caucasian male, seated, in no evident distress Head: head normocephalic and atraumatic.    Neurologic Exam Mental Status: Awake and fully alert. Oriented to place and time. Recent and remote memory intact. Attention span, concentration and fund of knowledge appropriate. Mood and affect appropriate.  Cranial Nerves: Extraocular movements full without nystagmus.  Hearing intact to voice. Face, tongue, palate moves normally and symmetrically.  Shoulder shrug symmetric. Motor: No evidence of weakness per drift assessment Sensory.:  UTA but does endorse numbness/tingling down left leg likely related to sciatica Coordination: Rapid alternating movements normal in all extremities. Finger-to-nose and heel-to-shin performed accurately bilaterally. Gait and Station: Arises from chair without difficulty. Stance is normal. Gait demonstrates normal stride length and balance with mild favoring of left leg.  Reflexes: UTA      Diagnostic Data (Labs, Imaging, Testing)  MRI SPINE LUMBER WO CONTRAST 06/17/2018 FINDINGS: # Vertebral bodies: No compression fracture. # Alignment: Normal. # Marrow signal: No significant abnormality. # Conus  medullaris: Normal. Terminates at L1-L2 with no evidence of tethering. # Lower thoracic segments: No significant abnormality.  # L1-2: Unremarkable # L2-3: Mild disc dehydration. Mild facet joint arthritis. # L3-4: Moderate degenerative disc disease. Mild bulging of the disc. Moderate facet joint arthritis with thickening of the ligamentum flavum. Moderate spinal stenosis. # L4-5: Mild degenerative disc disease. Mild facet joint arthritis. No significant spinal or foraminal stenosis. # L5-S1: Mild degenerative disc disease. Moderate facet joint arthritis. Small synovial cyst in the neural foramina bilaterally but no significant nerve root impingement or compression. Mild foraminal stenosis on the right.    ASSESSMENT: Marco Acevedo is a 84 y.o. year old male here with small acute left MCA territory infarcts on 10/13/2017 secondary to embolic due to left internal carotid artery stenosis. Vascular risk factors include OSA (patient denies diagnosis), obesity, HTN, HLD, DM, CHF, CAD with stents and previous strokes.  He has been stable from a stroke standpoint without residual deficits or reoccurring symptoms.  Greatest concern today is ongoing left-sided sciatica pain.   PLAN: -He will email/fax over recent lab work by [facility] and will consider potentially starting short course pharmacologic therapy for ongoing sciatica.  He will also have [facility] fax over office note after today's injection.  Advised him that due to his age and kidney function  along with review of his MRI, likely best treatment options would be continuation of injections and once able, to participate in therapy for further improvement.  He can also use Tylenol as needed for pain along with heat/ice and stretching -Continue clopidogrel 75 mg daily  and Lipitor for secondary stroke prevention -F/u with PCP regarding your HLD, HTN, and DM management -continue to monitor BP at home -advised to  continue to stay active and maintain a healthy diet -Maintain strict control of hypertension with blood pressure goal below 130/90, diabetes with hemoglobin A1c goal below 6.5% and cholesterol with LDL cholesterol (bad cholesterol) goal below 70 mg/dL. I also advised the patient to eat a healthy diet with plenty of whole grains, cereals, fruits and vegetables, exercise regularly and maintain ideal body weight.     Greater than 50% of time during this 25 minute visit was spent on counseling,explanation of diagnosis of left MCA territory infarcts, reviewing risk factor management of HTN, HLD, DM, CHF and CAD, planning of further management, discussion with patient and family and coordination of care    Venancio Poisson, AGNP-BC  [facility] [address] [place], [place] [address]  Phone [phone] Fax [phone] Note: This document was prepared with digital dictation and possible smart phrase technology. Any transcriptional errors that result from this process are unintentional.

## 2018-08-13 ENCOUNTER — Other Ambulatory Visit: Payer: Self-pay

## 2018-08-13 ENCOUNTER — Encounter: Payer: Self-pay | Admitting: Adult Health

## 2018-08-13 ENCOUNTER — Ambulatory Visit (INDEPENDENT_AMBULATORY_CARE_PROVIDER_SITE_OTHER): Payer: Medicare Other | Admitting: Adult Health

## 2018-08-13 DIAGNOSIS — E119 Type 2 diabetes mellitus without complications: Secondary | ICD-10-CM

## 2018-08-13 DIAGNOSIS — M5432 Sciatica, left side: Secondary | ICD-10-CM | POA: Diagnosis not present

## 2018-08-13 DIAGNOSIS — E785 Hyperlipidemia, unspecified: Secondary | ICD-10-CM | POA: Diagnosis not present

## 2018-08-13 DIAGNOSIS — I10 Essential (primary) hypertension: Secondary | ICD-10-CM

## 2018-08-13 DIAGNOSIS — I63512 Cerebral infarction due to unspecified occlusion or stenosis of left middle cerebral artery: Secondary | ICD-10-CM

## 2018-08-13 DIAGNOSIS — M533 Sacrococcygeal disorders, not elsewhere classified: Secondary | ICD-10-CM | POA: Diagnosis not present

## 2018-08-13 NOTE — Telephone Encounter (Signed)
Doxy email sent to pt this am at 736am for video visit with Janett Billow NP.

## 2018-08-13 NOTE — Progress Notes (Signed)
I agree with the above plan 

## 2018-08-15 ENCOUNTER — Encounter: Payer: Self-pay | Admitting: Adult Health

## 2018-08-19 ENCOUNTER — Other Ambulatory Visit: Payer: Self-pay | Admitting: Adult Health

## 2018-08-19 ENCOUNTER — Other Ambulatory Visit: Payer: Self-pay

## 2018-08-19 MED ORDER — GABAPENTIN 300 MG PO CAPS: 300.0000 mg | ORAL_CAPSULE | Freq: Every day | ORAL | 1 refills | Status: DC

## 2018-08-19 NOTE — Progress Notes (Signed)
Recommend initiating gabapentin 300 mg nightly due to neuropathic pain within his left foot.  Injections by orthopedics provided some benefit but did not help relieve pain in his left foot.  Reviewed recent lab work obtained by nephrology and per UpToDate, recommended dosage of gabapentin safe for him to use.  If this does not provide benefit, consider EMG/NCV for further evaluation.

## 2018-08-20 ENCOUNTER — Other Ambulatory Visit: Payer: Self-pay

## 2018-08-20 MED ORDER — GABAPENTIN 300 MG PO CAPS: 300.0000 mg | ORAL_CAPSULE | Freq: Every day | ORAL | 1 refills | Status: DC

## 2018-08-22 ENCOUNTER — Telehealth: Payer: Self-pay

## 2018-08-22 NOTE — Telephone Encounter (Signed)
Gabapentin rx fax twice to [employer] DR.L Turner at [phone]. The fax was confirmed twice as receive.

## 2018-09-03 ENCOUNTER — Encounter: Payer: Self-pay | Admitting: Adult Health

## 2018-09-03 ENCOUNTER — Telehealth: Payer: Self-pay

## 2018-09-03 NOTE — Telephone Encounter (Signed)
YOUR CARDIOLOGY TEAM HAS ARRANGED FOR AN E-VISIT FOR YOUR APPOINTMENT - PLEASE REVIEW IMPORTANT INFORMATION BELOW SEVERAL DAYS PRIOR TO YOUR APPOINTMENT  Due to the recent COVID-19 pandemic, we are transitioning in-person office visits to tele-medicine visits in an effort to decrease unnecessary exposure to our patients, their families, and staff. These visits are billed to your insurance just like a normal visit is. We also encourage you to sign up for MyChart if you have not already done so. You will need a smartphone if possible. For patients that do not have this, we can still complete the visit using a regular telephone but do prefer a smartphone to enable video when possible. You may have a family member that lives with you that can help. If possible, we also ask that you have a blood pressure cuff and scale at home to measure your blood pressure, heart rate and weight prior to your scheduled appointment. Patients with clinical needs that need an in-person evaluation and testing will still be able to come to the office if absolutely necessary. If you have any questions, feel free to call our office.     YOUR PROVIDER WILL BE USING THE FOLLOWING PLATFORM TO COMPLETE YOUR VISIT: Doximity  . IF USING MYCHART - How to Download the MyChart App to Your SmartPhone   - If Apple, go to App Store and type in MyChart in the search bar and download the app. If Android, ask patient to go to Google Play Store and type in MyChart in the search bar and download the app. The app is free but as with any other app downloads, your phone may require you to verify saved payment information or Apple/Android password.  - You will need to then log into the app with your MyChart username and password, and select [facility] as your healthcare provider to link the account.  - When it is time for your visit, go to the MyChart app, find appointments, and click Begin Video Visit. Be sure to Select Allow for your device to  access the Microphone and Camera for your visit. You will then be connected, and your provider will be with you shortly.  **If you have any issues connecting or need assistance, please contact MyChart service desk [phone] ([phone])**  **If using a computer, in order to ensure the best quality for your visit, you will need to use either of the following Internet Browsers: Google Chrome or Microsoft Edge**  . IF USING DOXIMITY or DOXY.ME - The staff will give you instructions on receiving your link to join the meeting the day of your visit.      2-3 DAYS BEFORE YOUR APPOINTMENT  You will receive a telephone call from one of our [facility] team members - your caller ID may say "Unknown caller." If this is a video visit, we will walk you through how to get the video launched on your phone. We will remind you check your blood pressure, heart rate and weight prior to your scheduled appointment. If you have an Apple Watch or Kardia, please upload any pertinent ECG strips the day before or morning of your appointment to MyChart. Our staff will also make sure you have reviewed the consent and agree to move forward with your scheduled tele-health visit.     THE DAY OF YOUR APPOINTMENT  Approximately 15 minutes prior to your scheduled appointment, you will receive a telephone call from one of HeartCare team - your caller ID may say "Unknown caller."    Our staff will confirm medications, vital signs for the day and any symptoms you may be experiencing. Please have this information available prior to the time of visit start. It may also be helpful for you to have a pad of paper and pen handy for any instructions given during your visit. They will also walk you through joining the smartphone meeting if this is a video visit.    CONSENT FOR TELE-HEALTH VISIT - PLEASE REVIEW  I hereby voluntarily request, consent and authorize [facility] and its employed or contracted physicians, physician  assistants, nurse practitioners or other licensed health care professionals (the Practitioner), to provide me with telemedicine health care services (the "Services") as deemed necessary by the treating Practitioner. I acknowledge and consent to receive the Services by the Practitioner via telemedicine. I understand that the telemedicine visit will involve communicating with the Practitioner through live audiovisual communication technology and the disclosure of certain medical information by electronic transmission. I acknowledge that I have been given the opportunity to request an in-person assessment or other available alternative prior to the telemedicine visit and am voluntarily participating in the telemedicine visit.  I understand that I have the right to withhold or withdraw my consent to the use of telemedicine in the course of my care at any time, without affecting my right to future care or treatment, and that the Practitioner or I may terminate the telemedicine visit at any time. I understand that I have the right to inspect all information obtained and/or recorded in the course of the telemedicine visit and may receive copies of available information for a reasonable fee.  I understand that some of the potential risks of receiving the Services via telemedicine include:  . Delay or interruption in medical evaluation due to technological equipment failure or disruption; . Information transmitted may not be sufficient (e.g. poor resolution of images) to allow for appropriate medical decision making by the Practitioner; and/or  . In rare instances, security protocols could fail, causing a breach of personal health information.  Furthermore, I acknowledge that it is my responsibility to provide information about my medical history, conditions and care that is complete and accurate to the best of my ability. I acknowledge that Practitioner's advice, recommendations, and/or decision may be based on  factors not within their control, such as incomplete or inaccurate data provided by me or distortions of diagnostic images or specimens that may result from electronic transmissions. I understand that the practice of medicine is not an exact science and that Practitioner makes no warranties or guarantees regarding treatment outcomes. I acknowledge that I will receive a copy of this consent concurrently upon execution via email to the email address I last provided but may also request a printed copy by calling the office of [facility].    I understand that my insurance will be billed for this visit.   I have read or had this consent read to me. . I understand the contents of this consent, which adequately explains the benefits and risks of the Services being provided via telemedicine.  . I have been provided ample opportunity to ask questions regarding this consent and the Services and have had my questions answered to my satisfaction. . I give my informed consent for the services to be provided through the use of telemedicine in my medical care  By participating in this telemedicine visit I agree to the above.  

## 2018-09-03 NOTE — Progress Notes (Signed)
Virtual Visit via Video Note   This visit type was conducted due to national recommendations for restrictions regarding the COVID-19 Pandemic (e.g. social distancing) in an effort to limit this patient's exposure and mitigate transmission in our community.  Due to his co-morbid illnesses, this patient is at least at moderate risk for complications without adequate follow up.  This format is felt to be most appropriate for this patient at this time.  All issues noted in this document were discussed and addressed.  A limited physical exam was performed with this format.  Please refer to the patient's chart for his consent to telehealth for [facility].   Date:  09/04/2018   ID:  Marco Acevedo, DOB August 03, 1934, MRN [id number]  Patient Location: Home Provider Location: Office  PCP:  Lawerance Cruel, MD  Cardiologist:  No primary care provider on file. [name], [name] Electrophysiologist:  None   Evaluation Performed:  Follow-Up Visit  Chief Complaint:  CAD  History of Present Illness:    Marco Acevedo is a 84 y.o. male with with a hx of CAD and prior circumflex stents, Chronic DHF, hyperlipidemia, hypertension, obesity and OSA.  History of CVA October 14, 2017 related to, left brain atheroembolic event from the left carotid.  Underwent left carotid endarterectomy on 10/19/2017.  Marco Acevedo is having serious physical limitations due to right leg sciatica.  Despite this, he still gets out and tries to play golf.  He has intermittent shortness of breath occurring predominantly at rest.  When he is playing golf or walking he is not short of breath.  He denies orthopnea.  Difficulty with breathing occurs with carrying on conversations.  He also gets heartburn.  Nitroglycerin helps the heartburn when he has it.  He also feels that episodes have been very rare and have improved on famotidine.  Recent blood work is been done by nephrology and by Dr. Melinda Crutch at [facility].  The patient does not have symptoms concerning for COVID-19 infection (fever, chills, cough, or new shortness of breath).    Past Medical History:  Diagnosis Date  . Anemia    low iron  . Arthritis   . BPH (benign prostatic hyperplasia)   . Bruises easily   . CAD (coronary artery disease)    a. per Dr. Thompson Caul note, prior stenting of Cx in 2007 and 2012. b. DES to Cx in 09/2012.  [name] Chronic diastolic CHF (congestive heart failure) ([place])   . CKD (chronic kidney disease), stage [name] ([place])   . Claustrophobia   . Complication of anesthesia    has to have head elevated to keep from strangling on post nasal drip  . COPD (chronic obstructive pulmonary disease) ([place])   . Diabetes mellitus type II    type 2  . Diarrhea 2015   had for a year and a half  . Dyspnea   . GERD (gastroesophageal reflux disease)   . Gout   . Granuloma annulare   . History of hiatal hernia   . History of kidney stones    Litthotrispy  . Hyperlipidemia   . Hypertension   . Neuropathy   . Obesity   . OSA (obstructive sleep apnea)   . Right sided weakness   . Stroke ([place]) 10/2017   Weakness right arm and leg  . Wears dentures    top  . Wears glasses    Past Surgical History:  Procedure Laterality Date  . BLEPHAROPLASTY    .  CARDIAC CATHETERIZATION  [phone]   X 2 stents  . CHOLECYSTECTOMY N/A 09/23/2014   Procedure: LAPAROSCOPIC CHOLECYSTECTOMY WITH INTRAOPERATIVE CHOLANGIOGRAM;  Surgeon: Autumn Messing [name], MD;  Location: [facility];  Service: General;  Laterality: N/A;  . COLONOSCOPY    . COLONOSCOPY WITH PROPOFOL N/A 03/22/2016   Procedure: COLONOSCOPY WITH PROPOFOL;  Surgeon: Wilford Corner, MD;  Location[facility] ENDOSCOPY;  Service: Endoscopy;  Laterality: N/A;  . ENDARTERECTOMY Left 10/19/2017   Procedure: ENDARTERECTOMY CAROTID LEFT;  Surgeon: [name], MD;  Location: [facility] OR;  Service: Vascular;  Laterality: Left;  . ESOPHAGOGASTRODUODENOSCOPY (EGD) WITH PROPOFOL N/A 03/22/2016   Procedure:  ESOPHAGOGASTRODUODENOSCOPY (EGD) WITH PROPOFOL;  Surgeon: Wilford Corner, MD;  Location: [facility] ENDOSCOPY;  Service: Endoscopy;  Laterality: N/A;  . EYE SURGERY     both cataracts  . KNEE ARTHROSCOPY WITH MEDIAL MENISECTOMY Right 08/19/2013   Procedure: RIGHT KNEE ARTHROSCOPY WITH PARTIAL MEDIAL MENISECTOMY AND CHONDROPLASTY;  Surgeon: Hessie Dibble, MD;  Location: [facility] [place];  Service: Orthopedics;  Laterality: Right;  . LEFT HEART CATH AND CORONARY ANGIOGRAPHY N/A 06/28/2016   Procedure: Left Heart Cath and Coronary Angiography;  Surgeon: Belva Crome, MD;  Location: [facility] CV LAB;  Service: Cardiovascular;  Laterality: N/A;  . LEFT HEART CATHETERIZATION WITH CORONARY ANGIOGRAM N/A 10/03/2011   Procedure: LEFT HEART CATHETERIZATION WITH CORONARY ANGIOGRAM;  Surgeon: Sinclair Grooms, MD;  Location: [facility] CATH LAB;  Service: Cardiovascular;  Laterality: N/A;  . LEFT HEART CATHETERIZATION WITH CORONARY ANGIOGRAM N/A 09/12/2012   Procedure: LEFT HEART CATHETERIZATION WITH CORONARY ANGIOGRAM;  Surgeon: Sinclair Grooms, MD;  Location: [facility] CATH LAB;  Service: Cardiovascular;  Laterality: N/A;  . LIPOMA EXCISION     Biospy only; left parotid gland  . LITHOTRIPSY    . none    . PERCUTANEOUS CORONARY STENT INTERVENTION (PCI-S) N/A 09/17/2012   Procedure: PERCUTANEOUS CORONARY STENT INTERVENTION (PCI-S);  Surgeon: Sinclair Grooms, MD;  Location: [facility] CATH LAB;  Service: Cardiovascular;  Laterality: N/A;  . TRIGGER FINGER RELEASE Left 12/21/2015   Procedure: LEFT LONG FINGER TRIGGER RELEASE ;  Surgeon: Milly Jakob, MD;  Location: [place];  Service: Orthopedics;  Laterality: Left;     Current Meds  Medication Sig  . albuterol (PROVENTIL HFA) 108 (90 BASE) MCG/ACT inhaler Inhale 2 puffs into the lungs every 6 (six) hours as needed for wheezing or shortness of breath.   [name] albuterol (PROVENTIL) (2.5 MG/3ML) 0.083% nebulizer solution Take 2.5 mg by nebulization every 6  (six) hours as needed for wheezing or shortness of breath.   . allopurinol (ZYLOPRIM) 100 MG tablet Take 100 mg by mouth daily.  [name] amLODipine (NORVASC) 10 MG tablet Take 1 tablet (10 mg total) by mouth daily.  [name] atorvastatin (LIPITOR) 80 MG tablet Take 1 tablet (80 mg total) by mouth daily at 6 PM.  . clopidogrel (PLAVIX) 75 MG tablet Take 1 tablet (75 mg total) by mouth daily.  [name] docusate sodium (STOOL SOFTENER) 100 MG capsule Take 100 mg by mouth daily.  . famotidine (PEPCID) 20 MG tablet Take 20 mg by mouth 2 (two) times daily.  . Ferrous Sulfate (IRON) 325 (65 FE) MG TABS Take 1 tablet by mouth daily.  . finasteride (PROSCAR) 5 MG tablet Take 1 tablet (5 mg total) by mouth daily.  [name] gabapentin (NEURONTIN) 300 MG capsule Take 1 capsule (300 mg total) by mouth at bedtime.  [name] glipiZIDE (GLUCOTROL) 5 MG tablet Take 5 mg  by mouth 3 (three) times daily.   [name] guaifenesin (HUMIBID E) 400 MG TABS tablet Take 400 mg by mouth at bedtime.   . isosorbide mononitrate (IMDUR) 120 MG 24 hr tablet Take 1 tablet (120 mg total) by mouth daily.  . nitroGLYCERIN (NITROSTAT) 0.4 MG SL tablet Place 0.4 mg under the tongue every 5 (five) minutes as needed for chest pain.  . primidone (MYSOLINE) 50 MG tablet Take 50 mg by mouth at bedtime.   . traMADol (ULTRAM) 50 MG tablet Take 1 tablet (50 mg total) by mouth at bedtime as needed for moderate pain.  . vitamin B-12 (CYANOCOBALAMIN) 1000 MCG tablet Take 1,000 mcg by mouth daily.     Allergies:   Cimetidine   Social History   Tobacco Use  . Smoking status: Former Smoker    Packs/day: 1.00    Years: 64.00    Pack years: 64.00    Types: Cigarettes    Last attempt to quit: 05/29/2011    Years since quitting: 7.2  . Smokeless tobacco: Never Used  Substance Use Topics  . Alcohol use: Yes    Comment: rarely  . Drug use: No     Family Hx: The patient's family history includes Aneurysm in his mother; Aortic aneurysm in his father; COPD in his sister; Cancer in  his brother; Other in his brother, sister, and sister.  ROS:   Please see the history of present illness.    Low back discomfort, left leg discomfort from buttocks to his heel, feels that he is walking on glass.  He is seeing both a neurosurgeon and an orthopedist.  Otherwise no complaints. All other systems reviewed and are negative.   Prior CV studies:   The following studies were reviewed today:  No new or recent cardiac imaging.  Last cardiac catheterization March 2018 and last stress test August 2016.  Most recent echo July 2019.  Study Conclusions  - Left ventricle: The cavity size was normal. There was mild   concentric hypertrophy. Systolic function was normal. The   estimated ejection fraction was in the range of 55% to 60%. Wall   motion was normal; there were no regional wall motion   abnormalities. Doppler parameters are consistent with abnormal   left ventricular relaxation (grade 1 diastolic dysfunction).   Doppler parameters are consistent with elevated ventricular   end-diastolic filling pressure. - Aortic valve: There was mild stenosis. There was no   regurgitation. - Left atrium: The atrium was mildly dilated. - Right ventricle: The cavity size was normal. Wall thickness was   normal. Systolic function was normal. - Right atrium: The atrium was normal in size. - Tricuspid valve: There was no regurgitation. - Pulmonary arteries: Systolic pressure was within the normal   range. - Inferior vena cava: The vessel was normal in size. - Pericardium, extracardiac: A trivial pericardial effusion was   identified.  Impressions:  - No cardiac source of emboli was indentified.  Labs/Other Tests and Data Reviewed:    EKG:  No ECG reviewed.  Recent Labs: 10/17/2017: ALT 20 10/20/2017: Hemoglobin 11.0; Platelets 226 10/21/2017: BUN 27; Creatinine, Ser 2.16; Potassium 4.3; Sodium 135   Recent Lipid Panel Lab Results  Component Value Date/Time   CHOL 220 (H)  10/14/2017 07:42 AM   TRIG 156 (H) 10/14/2017 07:42 AM   HDL 39 (L) 10/14/2017 07:42 AM   CHOLHDL 5.6 10/14/2017 07:42 AM   LDLCALC 150 (H) 10/14/2017 07:42 AM    Wt Readings from Last  3 Encounters:  09/04/18 206 lb (93.4 kg)  03/01/18 203 lb (92.1 kg)  11/30/17 211 lb (95.7 kg)     Objective:    Vital Signs:  BP 138/74   Pulse 71   Ht 5\' 8"  (1.727 m)   Wt 206 lb (93.4 kg)   BMI 31.32 kg/m    VITAL SIGNS:  reviewed GEN:  Morbidly obese RESPIRATORY:  normal respiratory effort, symmetric expansion CARDIOVASCULAR:  no peripheral edema NEURO:  alert and oriented x 3, no obvious focal deficit  ASSESSMENT & PLAN:    1. Coronary artery disease involving native coronary artery of native heart with angina pectoris ([place])   2. Essential hypertension   3. Pure hypercholesterolemia   4. CKD (chronic kidney disease) stage 3, GFR 30-59 ml/min (H[facility])   5. OSA (obstructive sleep apnea)   6. Dyspnea on exertion   7. Educated About Covid-19 Virus Infection    PLAN:  1. Stable angina pectoris with still some difficulty discerning if "indigestion" is angina or true reflux.  Nitroglycerin seems to help when he has severe episodes.  Generally having 1 episode every 2 to 3 months.  This is a significant improvement since famotidine was added to his regimen.  Secondary risk prevention discussed. 2. Blood pressure was at target 3. LDL target should be less than 70.  No recent data 4. This is a risk modifying condition.  He sees nephrology.  We will get any recent laboratory data. 5. Encouraged compliance with CPAP. 6. Dyspnea is occurring less so with exertion.  He likely has a component of diastolic heart failure.  Overall education and awareness concerning primary/secondary risk prevention was discussed in detail: LDL less than 70, hemoglobin A1c less than 7, blood pressure target less than 130/80 mmHg, >150 minutes of moderate aerobic activity per week, avoidance of smoking, weight control  (via diet and exercise), and continued surveillance/management of/for obstructive sleep apnea.  We may need to perform our on lipid and A1c if no recent data.  Kidney function may not allow use of an SGLT2 agent.  If possible to use, could help with dyspnea related to diastolic heart failure.    COVID-19 Education: The signs and symptoms of COVID-19 were discussed with the patient and how to seek care for testing (follow up with PCP or arrange E-visit).  The importance of social distancing was discussed today.  Time:   Today, I have spent 20 minutes with the patient with telehealth technology discussing the above problems.     Medication Adjustments/Labs and Tests Ordered: Current medicines are reviewed at length with the patient today.  Concerns regarding medicines are outlined above.   Tests Ordered: No orders of the defined types were placed in this encounter.   Medication Changes: No orders of the defined types were placed in this encounter.   Disposition:  Follow up in 6 month(s)  Signed, Sinclair Grooms, MD  09/04/2018 9:46 AM    [place]

## 2018-09-04 ENCOUNTER — Telehealth (INDEPENDENT_AMBULATORY_CARE_PROVIDER_SITE_OTHER): Payer: Medicare Other | Admitting: Interventional Cardiology

## 2018-09-04 ENCOUNTER — Other Ambulatory Visit: Payer: Self-pay

## 2018-09-04 ENCOUNTER — Encounter: Payer: Self-pay | Admitting: Interventional Cardiology

## 2018-09-04 VITALS — BP 138/74 | HR 71 | Ht 68.0 in | Wt 206.0 lb

## 2018-09-04 DIAGNOSIS — G4733 Obstructive sleep apnea (adult) (pediatric): Secondary | ICD-10-CM

## 2018-09-04 DIAGNOSIS — E7800 Pure hypercholesterolemia, unspecified: Secondary | ICD-10-CM

## 2018-09-04 DIAGNOSIS — I25119 Atherosclerotic heart disease of native coronary artery with unspecified angina pectoris: Secondary | ICD-10-CM | POA: Diagnosis not present

## 2018-09-04 DIAGNOSIS — I10 Essential (primary) hypertension: Secondary | ICD-10-CM

## 2018-09-04 DIAGNOSIS — N183 Chronic kidney disease, stage 3 unspecified: Secondary | ICD-10-CM

## 2018-09-04 DIAGNOSIS — Z7189 Other specified counseling: Secondary | ICD-10-CM

## 2018-09-04 DIAGNOSIS — R0609 Other forms of dyspnea: Secondary | ICD-10-CM

## 2018-09-04 NOTE — Patient Instructions (Signed)
Medication Instructions:  Your physician recommends that you continue on your current medications as directed. Please refer to the Current Medication list given to you today.  If you need a refill on your cardiac medications before your next appointment, please call your pharmacy.   Lab work: None If you have labs (blood work) drawn today and your tests are completely normal, you will receive your results only by: [name] MyChart Message (if you have MyChart) OR . A paper copy in the mail If you have any lab test that is abnormal or we need to change your treatment, we will call you to review the results.  Testing/Procedures: None  Follow-Up: At [facility], you and your health needs are our priority.  As part of our continuing mission to provide you with exceptional heart care, we have created designated Provider Care Teams.  These Care Teams include your primary Cardiologist (physician) and Advanced Practice Providers (APPs -  Physician Assistants and Nurse Practitioners) who all work together to provide you with the care you need, when you need it. You will need a follow up appointment in 6 months.  Please call our office 2 months in advance to schedule this appointment.  You may see Dr. Tamala Julian or one of the following Advanced Practice Providers on your designated Care Team:   Truitt Merle, NP Cecilie Kicks, NP . Kathyrn Drown, NP  Any Other Special Instructions Will Be Listed Below (If Applicable).

## 2018-09-06 ENCOUNTER — Telehealth: Payer: Self-pay | Admitting: Interventional Cardiology

## 2018-09-06 NOTE — Telephone Encounter (Signed)
Medical records requested from [facility] and [facility]. 09/06/18 vlm

## 2018-10-14 DIAGNOSIS — M47816 Spondylosis without myelopathy or radiculopathy, lumbar region: Secondary | ICD-10-CM | POA: Diagnosis not present

## 2018-10-23 DIAGNOSIS — M47816 Spondylosis without myelopathy or radiculopathy, lumbar region: Secondary | ICD-10-CM | POA: Diagnosis not present

## 2018-11-06 DIAGNOSIS — R194 Change in bowel habit: Secondary | ICD-10-CM | POA: Diagnosis not present

## 2018-11-06 DIAGNOSIS — I69359 Hemiplegia and hemiparesis following cerebral infarction affecting unspecified side: Secondary | ICD-10-CM | POA: Diagnosis not present

## 2018-11-06 DIAGNOSIS — R109 Unspecified abdominal pain: Secondary | ICD-10-CM | POA: Diagnosis not present

## 2018-11-06 DIAGNOSIS — N183 Chronic kidney disease, stage 3 (moderate): Secondary | ICD-10-CM | POA: Diagnosis not present

## 2018-11-06 DIAGNOSIS — I10 Essential (primary) hypertension: Secondary | ICD-10-CM | POA: Diagnosis not present

## 2018-11-06 DIAGNOSIS — I779 Disorder of arteries and arterioles, unspecified: Secondary | ICD-10-CM | POA: Diagnosis not present

## 2018-11-06 DIAGNOSIS — E1140 Type 2 diabetes mellitus with diabetic neuropathy, unspecified: Secondary | ICD-10-CM | POA: Diagnosis not present

## 2018-11-06 DIAGNOSIS — K219 Gastro-esophageal reflux disease without esophagitis: Secondary | ICD-10-CM | POA: Diagnosis not present

## 2018-11-12 DIAGNOSIS — M47812 Spondylosis without myelopathy or radiculopathy, cervical region: Secondary | ICD-10-CM | POA: Diagnosis not present

## 2018-11-13 DIAGNOSIS — E559 Vitamin D deficiency, unspecified: Secondary | ICD-10-CM | POA: Diagnosis not present

## 2018-11-13 DIAGNOSIS — M5430 Sciatica, unspecified side: Secondary | ICD-10-CM | POA: Diagnosis not present

## 2018-11-13 DIAGNOSIS — I129 Hypertensive chronic kidney disease with stage 1 through stage 4 chronic kidney disease, or unspecified chronic kidney disease: Secondary | ICD-10-CM | POA: Diagnosis not present

## 2018-11-13 DIAGNOSIS — N183 Chronic kidney disease, stage 3 (moderate): Secondary | ICD-10-CM | POA: Diagnosis not present

## 2018-11-13 DIAGNOSIS — E1122 Type 2 diabetes mellitus with diabetic chronic kidney disease: Secondary | ICD-10-CM | POA: Diagnosis not present

## 2018-11-13 DIAGNOSIS — E119 Type 2 diabetes mellitus without complications: Secondary | ICD-10-CM | POA: Diagnosis not present

## 2018-11-13 DIAGNOSIS — M109 Gout, unspecified: Secondary | ICD-10-CM | POA: Diagnosis not present

## 2018-11-20 DIAGNOSIS — Z0000 Encounter for general adult medical examination without abnormal findings: Secondary | ICD-10-CM | POA: Diagnosis not present

## 2018-11-20 DIAGNOSIS — Z6831 Body mass index (BMI) 31.0-31.9, adult: Secondary | ICD-10-CM | POA: Diagnosis not present

## 2018-11-24 DIAGNOSIS — I10 Essential (primary) hypertension: Secondary | ICD-10-CM | POA: Diagnosis not present

## 2018-11-24 DIAGNOSIS — I2510 Atherosclerotic heart disease of native coronary artery without angina pectoris: Secondary | ICD-10-CM | POA: Diagnosis not present

## 2018-11-24 DIAGNOSIS — N400 Enlarged prostate without lower urinary tract symptoms: Secondary | ICD-10-CM | POA: Diagnosis not present

## 2018-11-24 DIAGNOSIS — E7800 Pure hypercholesterolemia, unspecified: Secondary | ICD-10-CM | POA: Diagnosis not present

## 2018-11-24 DIAGNOSIS — N183 Chronic kidney disease, stage 3 (moderate): Secondary | ICD-10-CM | POA: Diagnosis not present

## 2018-11-24 DIAGNOSIS — E1140 Type 2 diabetes mellitus with diabetic neuropathy, unspecified: Secondary | ICD-10-CM | POA: Diagnosis not present

## 2018-12-20 DIAGNOSIS — Z23 Encounter for immunization: Secondary | ICD-10-CM | POA: Diagnosis not present

## 2019-01-30 DIAGNOSIS — Z20828 Contact with and (suspected) exposure to other viral communicable diseases: Secondary | ICD-10-CM | POA: Diagnosis not present

## 2019-01-31 ENCOUNTER — Other Ambulatory Visit: Payer: Self-pay

## 2019-01-31 DIAGNOSIS — Z20822 Contact with and (suspected) exposure to covid-19: Secondary | ICD-10-CM

## 2019-02-01 LAB — NOVEL CORONAVIRUS, NAA: SARS-CoV-2, NAA: NOT DETECTED

## 2019-02-14 DIAGNOSIS — M25522 Pain in left elbow: Secondary | ICD-10-CM | POA: Diagnosis not present

## 2019-02-17 ENCOUNTER — Telehealth: Payer: Self-pay | Admitting: Interventional Cardiology

## 2019-02-17 DIAGNOSIS — R0789 Other chest pain: Secondary | ICD-10-CM | POA: Diagnosis not present

## 2019-02-17 DIAGNOSIS — R000 Tachycardia, unspecified: Secondary | ICD-10-CM | POA: Diagnosis not present

## 2019-02-17 DIAGNOSIS — I10 Essential (primary) hypertension: Secondary | ICD-10-CM | POA: Diagnosis not present

## 2019-02-17 DIAGNOSIS — R079 Chest pain, unspecified: Secondary | ICD-10-CM | POA: Diagnosis not present

## 2019-02-17 NOTE — Telephone Encounter (Signed)
Patient requests that Dr. Tamala Julian call him sometime today. HE only would like to speak with Dr.Smith

## 2019-02-17 NOTE — Telephone Encounter (Signed)
Pt states that he has terrible issues with heartburn but last night had something he wasn't sure was heartburn.  Was having chest discomfort after drinking 1.5 glasses of wine and 2 chocolate donuts.  The discomfort seemed worse than normal and he eventually called EMS.  They came out and EKG was fine.  During last nights episode he took 2 Tramadol, 3 Nitro, 6 Tums and a "sip" of Maalox with minimal relief until after EMS left.  Today pt went out and played golf.  Had chest discomfort on the course and took 2 Nitro with relief.  No vitals but says BP was high when EMS arrived but eventually came back down to normal.  Scheduled pt to come in and see Dr Tamala Julian on Thursday.  Advised pt to take it easy until then and no golf.  Pt verbalized understanding and was appreciative for call.

## 2019-02-19 NOTE — Progress Notes (Signed)
Cardiology Office Note:    Date:  02/20/2019   ID:  Marco Acevedo, DOB Dec 03, 1934, MRN [id number]  PCP:  Lawerance Cruel, MD  Cardiologist:  Sinclair Grooms, MD   Referring MD: Lawerance Cruel, MD   Chief Complaint  Patient presents with  . Coronary Artery Disease    Angina    History of Present Illness:    Marco Acevedo is a 84 y.o. male with a hx of CAD and prior circumflex stents, Chronic DHF, hyperlipidemia, hypertension, obesity and OSA. History of CVA October 14, 2017 related to, left brain atheroembolic event from the left carotid. Underwent left carotid endarterectomy on 10/19/2017.  For the past 5 to 7 days he has been experiencing aching in his chest.  This was severe on Sunday evening November 8.  Sublingual nitroglycerin was used.  There was some relief.  He summoned EMS to his house.  EKG was normal.  They recommended going to the emergency room to be evaluated.  He refused.  The next day he played golf.  He had 2 additional episodes of discomfort while playing.  Each time nitroglycerin improved the discomfort.  Since then he has had intermittent episodes.  None of lasted longer than 5 minutes.  There is no exertional component.  Past Medical History:  Diagnosis Date  . Anemia    low iron  . Arthritis   . BPH (benign prostatic hyperplasia)   . Bruises easily   . CAD (coronary artery disease)    a. per Dr. Thompson Caul note, prior stenting of Cx in 2007 and 2012. b. DES to Cx in 09/2012.  [name] Chronic diastolic CHF (congestive heart failure) ([place])   . CKD (chronic kidney disease), stage [name]   . Claustrophobia   . Complication of anesthesia    has to have head elevated to keep from strangling on post nasal drip  . COPD (chronic obstructive pulmonary disease) ([place])   . Diabetes mellitus type II    type 2  . Diarrhea 2015   had for a year and a half  . Dyspnea   . GERD (gastroesophageal reflux disease)   . Gout   . Granuloma annulare   . History of hiatal hernia    . History of kidney stones    Litthotrispy  . Hyperlipidemia   . Hypertension   . Neuropathy   . Obesity   . OSA (obstructive sleep apnea)   . Right sided weakness   . Stroke ([place]) 10/2017   Weakness right arm and leg  . Wears dentures    top  . Wears glasses     Past Surgical History:  Procedure Laterality Date  . BLEPHAROPLASTY    . CARDIAC CATHETERIZATION  [phone]   X 2 stents  . CHOLECYSTECTOMY N/A 09/23/2014   Procedure: LAPAROSCOPIC CHOLECYSTECTOMY WITH INTRAOPERATIVE CHOLANGIOGRAM;  Surgeon: Autumn Messing [name], MD;  Location: [facility];  Service: General;  Laterality: N/A;  . COLONOSCOPY    . COLONOSCOPY WITH PROPOFOL N/A 03/22/2016   Procedure: COLONOSCOPY WITH PROPOFOL;  Surgeon: Wilford Corner, MD;  Location: [facility] ENDOSCOPY;  Service: Endoscopy;  Laterality: N/A;  . ENDARTERECTOMY Left 10/19/2017   Procedure: ENDARTERECTOMY CAROTID LEFT;  Surgeon: [name], MD;  Location: [facility] OR;  Service: Vascular;  Laterality: Left;  . ESOPHAGOGASTRODUODENOSCOPY (EGD) WITH PROPOFOL N/A 03/22/2016   Procedure: ESOPHAGOGASTRODUODENOSCOPY (EGD) WITH PROPOFOL;  Surgeon: Wilford Corner, MD;  Location: [facility] ENDOSCOPY;  Service: Endoscopy;  Laterality: N/A;  . EYE SURGERY  both cataracts  . KNEE ARTHROSCOPY WITH MEDIAL MENISECTOMY Right 08/19/2013   Procedure: RIGHT KNEE ARTHROSCOPY WITH PARTIAL MEDIAL MENISECTOMY AND CHONDROPLASTY;  Surgeon: Hessie Dibble, MD;  Location: [facility];  Service: Orthopedics;  Laterality: Right;  . LEFT HEART CATH AND CORONARY ANGIOGRAPHY N/A 06/28/2016   Procedure: Left Heart Cath and Coronary Angiography;  Surgeon: Belva Crome, MD;  Location: [facility] CV LAB;  Service: Cardiovascular;  Laterality: N/A;  . LEFT HEART CATHETERIZATION WITH CORONARY ANGIOGRAM N/A 10/03/2011   Procedure: LEFT HEART CATHETERIZATION WITH CORONARY ANGIOGRAM;  Surgeon: Sinclair Grooms, MD;  Location: [facility] CATH LAB;  Service: Cardiovascular;  Laterality: N/A;  . LEFT  HEART CATHETERIZATION WITH CORONARY ANGIOGRAM N/A 09/12/2012   Procedure: LEFT HEART CATHETERIZATION WITH CORONARY ANGIOGRAM;  Surgeon: Sinclair Grooms, MD;  Location: [facility] CATH LAB;  Service: Cardiovascular;  Laterality: N/A;  . LIPOMA EXCISION     Biospy only; left parotid gland  . LITHOTRIPSY    . none    . PERCUTANEOUS CORONARY STENT INTERVENTION (PCI-S) N/A 09/17/2012   Procedure: PERCUTANEOUS CORONARY STENT INTERVENTION (PCI-S);  Surgeon: Sinclair Grooms, MD;  Location: [facility] CATH LAB;  Service: Cardiovascular;  Laterality: N/A;  . TRIGGER FINGER RELEASE Left 12/21/2015   Procedure: LEFT LONG FINGER TRIGGER RELEASE ;  Surgeon: Milly Jakob, MD;  Location: [place];  Service: Orthopedics;  Laterality: Left;    Current Medications: Current Meds  Medication Sig  . albuterol (PROVENTIL HFA) 108 (90 BASE) MCG/ACT inhaler Inhale 2 puffs into the lungs every 6 (six) hours as needed for wheezing or shortness of breath.   [name] albuterol (PROVENTIL) (2.5 MG/3ML) 0.083% nebulizer solution Take 2.5 mg by nebulization every 6 (six) hours as needed for wheezing or shortness of breath.   . allopurinol (ZYLOPRIM) 100 MG tablet Take 100 mg by mouth daily.  [name] amLODipine (NORVASC) 10 MG tablet Take 1 tablet (10 mg total) by mouth daily.  [name] atorvastatin (LIPITOR) 80 MG tablet Take 1 tablet (80 mg total) by mouth daily at 6 PM.  . clopidogrel (PLAVIX) 75 MG tablet Take 1 tablet (75 mg total) by mouth daily.  [name] docusate sodium (STOOL SOFTENER) 100 MG capsule Take 100 mg by mouth daily.  . famotidine (PEPCID) 20 MG tablet Take 20 mg by mouth 2 (two) times daily.  . Ferrous Sulfate (IRON) 325 (65 FE) MG TABS Take 1 tablet by mouth daily.  . finasteride (PROSCAR) 5 MG tablet Take 1 tablet (5 mg total) by mouth daily.  [name] gabapentin (NEURONTIN) 300 MG capsule Take 1 capsule (300 mg total) by mouth at bedtime.  [name] glipiZIDE (GLUCOTROL) 5 MG tablet Take 5 mg by mouth 3 (three) times daily.   [name]  guaifenesin (HUMIBID E) 400 MG TABS tablet Take 400 mg by mouth at bedtime.   . isosorbide mononitrate (IMDUR) 120 MG 24 hr tablet Take 1 tablet (120 mg total) by mouth daily.  . nitroGLYCERIN (NITROSTAT) 0.4 MG SL tablet Place 0.4 mg under the tongue every 5 (five) minutes as needed for chest pain.  . primidone (MYSOLINE) 50 MG tablet Take 50 mg by mouth at bedtime.   . traMADol (ULTRAM) 50 MG tablet Take 1 tablet (50 mg total) by mouth at bedtime as needed for moderate pain.  . vitamin B-12 (CYANOCOBALAMIN) 1000 MCG tablet Take 1,000 mcg by mouth daily.     Allergies:   Cimetidine   Social History   Socioeconomic History  .  Marital status: Married    Spouse name: Not on file  . Number of children: Not on file  . Years of education: Not on file  . Highest education level: Not on file  Occupational History  . Occupation: retired  Scientific laboratory technician  . Financial resource strain: Not on file  . Food insecurity    Worry: Not on file    Inability: Not on file  . Transportation needs    Medical: Not on file    Non-medical: Not on file  Tobacco Use  . Smoking status: Former Smoker    Packs/day: 1.00    Years: 64.00    Pack years: 64.00    Types: Cigarettes    Quit date: 05/29/2011    Years since quitting: 7.7  . Smokeless tobacco: Never Used  Substance and Sexual Activity  . Alcohol use: Yes    Comment: rarely  . Drug use: No  . Sexual activity: Not Currently  Lifestyle  . Physical activity    Days per week: Not on file    Minutes per session: Not on file  . Stress: Not on file  Relationships  . Social Herbalist on phone: Not on file    Gets together: Not on file    Attends religious service: Not on file    Active member of club or organization: Not on file    Attends meetings of clubs or organizations: Not on file    Relationship status: Not on file  Other Topics Concern  . Not on file  Social History Narrative   Denies Caffeine use      Family History:  The patient's family history includes Aneurysm in his mother; Aortic aneurysm in his father; COPD in his sister; Cancer in his brother; Other in his brother, sister, and sister.  ROS:   Please see the history of present illness.    He denies lower extremity swelling, denies dyspnea.  He is compliant with his current medical regimen.  All other systems reviewed and are negative.  EKGs/Labs/Other Studies Reviewed:    The following studies were reviewed today: The EKG performed by EMS on 02/16/2019 is identical to today's EKG.  EKG:  EKG normal sinus rhythm.  Noisy background with motion artifact.  No acute ST-T wave changes.  When compared to the prior tracing, pacing is no longer present.  Recent Labs: No results found for requested labs within last 8760 hours.  Recent Lipid Panel    Component Value Date/Time   CHOL 220 (H) 10/14/2017 0742   TRIG 156 (H) 10/14/2017 0742   HDL 39 (L) 10/14/2017 0742   CHOLHDL 5.6 10/14/2017 0742   VLDL 31 10/14/2017 0742   LDLCALC 150 (H) 10/14/2017 0742    Physical Exam:    VS:  BP 124/70   Pulse 72   Ht 5\' 8"  (1.727 m)   Wt 206 lb 12.8 oz (93.8 kg)   SpO2 96%   BMI 31.44 kg/m     Wt Readings from Last 3 Encounters:  02/20/19 206 lb 12.8 oz (93.8 kg)  09/04/18 206 lb (93.4 kg)  03/01/18 203 lb (92.1 kg)     GEN: Appears younger than stated age.. No acute distress HEENT: Normal NECK: No JVD. LYMPHATICS: No lymphadenopathy CARDIAC:  RRR without murmur, gallop, or edema. VASCULAR:  Normal Pulses. No bruits. RESPIRATORY:  Clear to auscultation without rales, wheezing or rhonchi  ABDOMEN: Soft, non-tender, non-distended, No pulsatile mass, MUSCULOSKELETAL: No deformity  SKIN: Warm  and dry NEUROLOGIC:  Alert and oriented x 3 PSYCHIATRIC:  Normal affect   ASSESSMENT:    1. Coronary artery disease involving native coronary artery of native heart with angina pectoris ([place])   2. Essential hypertension   3. Pure hypercholesterolemia    4. Stage 3b chronic kidney disease   5. Educated about COVID-19 virus infection    PLAN:    In order of problems listed above:  1. I believe he is having accelerating angina.  He is not excited about being in the hospital.  We will plan coronary angiography as soon as possible.  He will need to be Covid tested.  I will reinstitute aspirin 81 mg/day to his regimen.  He is already on max dose Imdur.  He is not to play golf or do physical activity that aggravates his chest discomfort.  If chest discomfort continues to be a problem he should report to the emergency room. 2. Blood pressure is under good control 3. LDL is less than 70. 4. He will need hydration prior to cath because of CKD stage [name]. 5. 3W's is being honored.  He has CKD and will need to arrive early for hydration.  The patient was counseled to undergo left heart catheterization, coronary angiography, and possible percutaneous coronary intervention with stent implantation. The procedural risks and benefits were discussed in detail. The risks discussed included death, stroke, myocardial infarction, life-threatening bleeding, limb ischemia, kidney injury, allergy, and possible emergency cardiac surgery. The risk of these significant complications were estimated to occur less than 1% of the time. After discussion, the patient has agreed to proceed.    Medication Adjustments/Labs and Tests Ordered: Current medicines are reviewed at length with the patient today.  Concerns regarding medicines are outlined above.  No orders of the defined types were placed in this encounter.  No orders of the defined types were placed in this encounter.   There are no Patient Instructions on file for this visit.   Signed, Sinclair Grooms, MD  02/20/2019 4:19 PM    [facility]

## 2019-02-20 ENCOUNTER — Ambulatory Visit (INDEPENDENT_AMBULATORY_CARE_PROVIDER_SITE_OTHER): Payer: Medicare Other | Admitting: Interventional Cardiology

## 2019-02-20 ENCOUNTER — Encounter: Payer: Self-pay | Admitting: Interventional Cardiology

## 2019-02-20 ENCOUNTER — Other Ambulatory Visit: Payer: Self-pay

## 2019-02-20 VITALS — BP 124/70 | HR 72 | Ht 68.0 in | Wt 206.8 lb

## 2019-02-20 DIAGNOSIS — I25119 Atherosclerotic heart disease of native coronary artery with unspecified angina pectoris: Secondary | ICD-10-CM

## 2019-02-20 DIAGNOSIS — E7800 Pure hypercholesterolemia, unspecified: Secondary | ICD-10-CM | POA: Diagnosis not present

## 2019-02-20 DIAGNOSIS — Z7189 Other specified counseling: Secondary | ICD-10-CM

## 2019-02-20 DIAGNOSIS — N1832 Chronic kidney disease, stage 3b: Secondary | ICD-10-CM | POA: Diagnosis not present

## 2019-02-20 DIAGNOSIS — I10 Essential (primary) hypertension: Secondary | ICD-10-CM | POA: Diagnosis not present

## 2019-02-20 MED ORDER — ASPIRIN EC 81 MG PO TBEC: 81.0000 mg | DELAYED_RELEASE_TABLET | Freq: Every day | ORAL | 3 refills | Status: DC

## 2019-02-20 NOTE — Patient Instructions (Signed)
Medication Instructions:  1) START Aspirin 81mg  once daily  *If you need a refill on your cardiac medications before your next appointment, please call your pharmacy*  Lab Work: BMET and CBC today  If you have labs (blood work) drawn today and your tests are completely normal, you will receive your results only by: [name] MyChart Message (if you have MyChart) OR . A paper copy in the mail If you have any lab test that is abnormal or we need to change your treatment, we will call you to review the results.  Testing/Procedures: Your physician has requested that you have a cardiac catheterization. Cardiac catheterization is used to diagnose and/or treat various heart conditions. Doctors may recommend this procedure for a number of different reasons. The most common reason is to evaluate chest pain. Chest pain can be a symptom of coronary artery disease (CAD), and cardiac catheterization can show whether plaque is narrowing or blocking your heart's arteries. This procedure is also used to evaluate the valves, as well as measure the blood flow and oxygen levels in different parts of your heart. For further information please visit [email or web address]. Please follow instruction sheet, as given.   Follow-Up: At [facility], you and your health needs are our priority.  As part of our continuing mission to provide you with exceptional heart care, we have created designated Provider Care Teams.  These Care Teams include your primary Cardiologist (physician) and Advanced Practice Providers (APPs -  Physician Assistants and Nurse Practitioners) who all work together to provide you with the care you need, when you need it.  Your next appointment:   2-3 weeks after heart cath  The format for your next appointment:   In Person  Provider:   You may see Sinclair Grooms, MD or one of the following Advanced Practice Providers on your designated Care Team:    Truitt Merle, NP  Cecilie Kicks, NP  Kathyrn Drown, NP   Other Instructions  COVID SCREENING INFORMATION: You are scheduled for your COVID screening on Saturday, November 14th at [facility] [address] Stay in the RIGHT lane and proceed under the brick awning (NOT the tent) and tell them you are there for pre-procedure testing Do NOT bring any pets with you to the testing site      [place], [place] [address] Dept: [place]: [place]  02/20/2019  You are scheduled for a Cardiac Catheterization on Tuesday, November 17 with Dr. Daneen Schick.  1. Please arrive at the [facility] (Main Entrance A) at [facility]: [address] [place], [place] [address] at 7:00 AM (This time is two hours before your procedure to ensure your preparation). Free valet parking service is available.   Special note: Every effort is made to have your procedure done on time. Please understand that emergencies sometimes delay scheduled procedures.  2. Diet: Do not eat solid foods after midnight.  The patient may have clear liquids until 5am upon the day of the procedure.  3. Labs: You will have labs drawn today.  4. Medication instructions in preparation for your procedure:   Contrast Allergy: No  Do not take your Glipizide the morning of your procedure.   On the morning of your procedure, take your Aspirin and Plavix/Clopidogrel and any morning medicines NOT listed above.  You may  use sips of water.  5. Plan for one night stay--bring personal belongings. 6. Bring a current list of your medications and current insurance cards. 7. You MUST have a responsible person to drive you home. 8. Someone MUST be with you the first 24 hours after you arrive home or your discharge will be delayed. 9. Please wear clothes that are easy to get on and off  and wear slip-on shoes.  Thank you for allowing [place] to care for you!   -- [facility]

## 2019-02-21 LAB — CBC
Hematocrit: 43.1 % (ref 37.5–51.0)
Hemoglobin: 14.8 g/dL (ref 13.0–17.7)
MCH: 29.3 pg (ref 26.6–33.0)
MCHC: 34.3 g/dL (ref 31.5–35.7)
MCV: 85 fL (ref 79–97)
Platelets: 309 10*3/uL (ref 150–450)
RBC: 5.05 x10E6/uL (ref 4.14–5.80)
RDW: 13.7 % (ref 11.6–15.4)
WBC: 9.7 10*3/uL (ref 3.4–10.8)

## 2019-02-21 LAB — BASIC METABOLIC PANEL
BUN/Creatinine Ratio: 22 (ref 10–24)
BUN: 46 mg/dL — ABNORMAL HIGH (ref 8–27)
CO2: 22 mmol/L (ref 20–29)
Calcium: 9.8 mg/dL (ref 8.6–10.2)
Chloride: 101 mmol/L (ref 96–106)
Creatinine, Ser: 2.05 mg/dL — ABNORMAL HIGH (ref 0.76–1.27)
GFR calc Af Amer: 34 mL/min/{1.73_m2} — ABNORMAL LOW (ref >59)
GFR calc non Af Amer: 29 mL/min/{1.73_m2} — ABNORMAL LOW (ref >59)
Glucose: 103 mg/dL — ABNORMAL HIGH (ref 65–99)
Potassium: 4.7 mmol/L (ref 3.5–5.2)
Sodium: 139 mmol/L (ref 134–144)

## 2019-02-22 ENCOUNTER — Other Ambulatory Visit (HOSPITAL_COMMUNITY)
Admission: RE | Admit: 2019-02-22 | Discharge: 2019-02-22 | Disposition: A | Discharge status: Home / Self Care | Payer: Medicare Other | Source: Ambulatory Visit | Attending: Interventional Cardiology | Admitting: Interventional Cardiology

## 2019-02-22 DIAGNOSIS — I130 Hypertensive heart and chronic kidney disease with heart failure and stage 1 through stage 4 chronic kidney disease, or unspecified chronic kidney disease: Secondary | ICD-10-CM | POA: Diagnosis not present

## 2019-02-22 DIAGNOSIS — R0789 Other chest pain: Secondary | ICD-10-CM | POA: Diagnosis not present

## 2019-02-22 DIAGNOSIS — Z20828 Contact with and (suspected) exposure to other viral communicable diseases: Secondary | ICD-10-CM | POA: Insufficient documentation

## 2019-02-22 DIAGNOSIS — I200 Unstable angina: Secondary | ICD-10-CM | POA: Diagnosis not present

## 2019-02-22 DIAGNOSIS — Z01812 Encounter for preprocedural laboratory examination: Secondary | ICD-10-CM | POA: Insufficient documentation

## 2019-02-22 DIAGNOSIS — E785 Hyperlipidemia, unspecified: Secondary | ICD-10-CM | POA: Diagnosis not present

## 2019-02-22 DIAGNOSIS — I25110 Atherosclerotic heart disease of native coronary artery with unstable angina pectoris: Secondary | ICD-10-CM | POA: Diagnosis not present

## 2019-02-22 DIAGNOSIS — R079 Chest pain, unspecified: Secondary | ICD-10-CM | POA: Diagnosis not present

## 2019-02-22 DIAGNOSIS — Z8673 Personal history of transient ischemic attack (TIA), and cerebral infarction without residual deficits: Secondary | ICD-10-CM | POA: Diagnosis not present

## 2019-02-22 DIAGNOSIS — I5032 Chronic diastolic (congestive) heart failure: Secondary | ICD-10-CM | POA: Diagnosis not present

## 2019-02-23 ENCOUNTER — Inpatient Hospital Stay (HOSPITAL_COMMUNITY)
Admission: EM | Admit: 2019-02-23 | Discharge: 2019-02-25 | DRG: 287 | Disposition: A | Discharge status: Home / Self Care | Payer: Medicare Other | Attending: Internal Medicine | Admitting: Internal Medicine

## 2019-02-23 ENCOUNTER — Encounter (HOSPITAL_COMMUNITY): Payer: Self-pay | Admitting: Emergency Medicine

## 2019-02-23 ENCOUNTER — Emergency Department (HOSPITAL_COMMUNITY): Payer: Medicare Other

## 2019-02-23 ENCOUNTER — Other Ambulatory Visit: Payer: Self-pay

## 2019-02-23 DIAGNOSIS — I130 Hypertensive heart and chronic kidney disease with heart failure and stage 1 through stage 4 chronic kidney disease, or unspecified chronic kidney disease: Secondary | ICD-10-CM | POA: Diagnosis present

## 2019-02-23 DIAGNOSIS — K219 Gastro-esophageal reflux disease without esophagitis: Secondary | ICD-10-CM | POA: Diagnosis present

## 2019-02-23 DIAGNOSIS — Z7902 Long term (current) use of antithrombotics/antiplatelets: Secondary | ICD-10-CM

## 2019-02-23 DIAGNOSIS — Z79899 Other long term (current) drug therapy: Secondary | ICD-10-CM

## 2019-02-23 DIAGNOSIS — I10 Essential (primary) hypertension: Secondary | ICD-10-CM | POA: Diagnosis present

## 2019-02-23 DIAGNOSIS — I200 Unstable angina: Secondary | ICD-10-CM | POA: Diagnosis present

## 2019-02-23 DIAGNOSIS — I25110 Atherosclerotic heart disease of native coronary artery with unstable angina pectoris: Principal | ICD-10-CM | POA: Diagnosis present

## 2019-02-23 DIAGNOSIS — E785 Hyperlipidemia, unspecified: Secondary | ICD-10-CM | POA: Diagnosis present

## 2019-02-23 DIAGNOSIS — N183 Chronic kidney disease, stage 3 unspecified: Secondary | ICD-10-CM | POA: Diagnosis present

## 2019-02-23 DIAGNOSIS — Z8673 Personal history of transient ischemic attack (TIA), and cerebral infarction without residual deficits: Secondary | ICD-10-CM

## 2019-02-23 DIAGNOSIS — E7800 Pure hypercholesterolemia, unspecified: Secondary | ICD-10-CM | POA: Diagnosis present

## 2019-02-23 DIAGNOSIS — N400 Enlarged prostate without lower urinary tract symptoms: Secondary | ICD-10-CM | POA: Diagnosis present

## 2019-02-23 DIAGNOSIS — G4733 Obstructive sleep apnea (adult) (pediatric): Secondary | ICD-10-CM | POA: Diagnosis present

## 2019-02-23 DIAGNOSIS — I5032 Chronic diastolic (congestive) heart failure: Secondary | ICD-10-CM | POA: Diagnosis present

## 2019-02-23 DIAGNOSIS — F40240 Claustrophobia: Secondary | ICD-10-CM | POA: Diagnosis present

## 2019-02-23 DIAGNOSIS — D649 Anemia, unspecified: Secondary | ICD-10-CM | POA: Diagnosis present

## 2019-02-23 DIAGNOSIS — Z20828 Contact with and (suspected) exposure to other viral communicable diseases: Secondary | ICD-10-CM | POA: Diagnosis present

## 2019-02-23 DIAGNOSIS — E1140 Type 2 diabetes mellitus with diabetic neuropathy, unspecified: Secondary | ICD-10-CM | POA: Diagnosis present

## 2019-02-23 DIAGNOSIS — M109 Gout, unspecified: Secondary | ICD-10-CM | POA: Diagnosis present

## 2019-02-23 DIAGNOSIS — J449 Chronic obstructive pulmonary disease, unspecified: Secondary | ICD-10-CM | POA: Diagnosis present

## 2019-02-23 DIAGNOSIS — J069 Acute upper respiratory infection, unspecified: Secondary | ICD-10-CM | POA: Diagnosis present

## 2019-02-23 DIAGNOSIS — N184 Chronic kidney disease, stage 4 (severe): Secondary | ICD-10-CM | POA: Diagnosis present

## 2019-02-23 DIAGNOSIS — Z7982 Long term (current) use of aspirin: Secondary | ICD-10-CM

## 2019-02-23 DIAGNOSIS — Z7984 Long term (current) use of oral hypoglycemic drugs: Secondary | ICD-10-CM

## 2019-02-23 DIAGNOSIS — R079 Chest pain, unspecified: Secondary | ICD-10-CM | POA: Diagnosis not present

## 2019-02-23 DIAGNOSIS — Z87891 Personal history of nicotine dependence: Secondary | ICD-10-CM

## 2019-02-23 LAB — BASIC METABOLIC PANEL
Anion gap: 13 (ref 5–15)
BUN: 43 mg/dL — ABNORMAL HIGH (ref 8–23)
CO2: 20 mmol/L — ABNORMAL LOW (ref 22–32)
Calcium: 9.2 mg/dL (ref 8.9–10.3)
Chloride: 104 mmol/L (ref 98–111)
Creatinine, Ser: 2.11 mg/dL — ABNORMAL HIGH (ref 0.61–1.24)
GFR calc Af Amer: 33 mL/min — ABNORMAL LOW (ref >60)
GFR calc non Af Amer: 28 mL/min — ABNORMAL LOW (ref >60)
Glucose, Bld: 204 mg/dL — ABNORMAL HIGH (ref 70–99)
Potassium: 4.4 mmol/L (ref 3.5–5.1)
Sodium: 137 mmol/L (ref 135–145)

## 2019-02-23 LAB — CBC
HCT: 44.8 % (ref 39.0–52.0)
Hemoglobin: 15 g/dL (ref 13.0–17.0)
MCH: 29 pg (ref 26.0–34.0)
MCHC: 33.5 g/dL (ref 30.0–36.0)
MCV: 86.5 fL (ref 80.0–100.0)
Platelets: 275 10*3/uL (ref 150–400)
RBC: 5.18 MIL/uL (ref 4.22–5.81)
RDW: 13.6 % (ref 11.5–15.5)
WBC: 8.9 10*3/uL (ref 4.0–10.5)
nRBC: 0 % (ref 0.0–0.2)

## 2019-02-23 LAB — TROPONIN I (HIGH SENSITIVITY): Troponin I (High Sensitivity): 34 ng/L — ABNORMAL HIGH (ref <18)

## 2019-02-23 LAB — NOVEL CORONAVIRUS, NAA (HOSP ORDER, SEND-OUT TO REF LAB; TAT 18-24 HRS): SARS-CoV-2, NAA: NOT DETECTED

## 2019-02-23 MED ORDER — SODIUM CHLORIDE 0.9% FLUSH
3.0000 mL | Freq: Once | INTRAVENOUS | Status: AC
  Administered 2019-02-24: 04:00:00 3 mL via INTRAVENOUS

## 2019-02-23 NOTE — ED Triage Notes (Signed)
Patient with chest pain that started last Sunday. Patient has taken 6 nitro today and patient continues with the chest pressure.  Patient does have some shortness of breath with the pain.  Patient denies any nausea or vomiting.  Patient denies any diaphoresis.

## 2019-02-24 ENCOUNTER — Telehealth: Payer: Self-pay | Admitting: Interventional Cardiology

## 2019-02-24 ENCOUNTER — Encounter (HOSPITAL_COMMUNITY): Payer: Self-pay | Admitting: *Deleted

## 2019-02-24 ENCOUNTER — Other Ambulatory Visit: Payer: Self-pay

## 2019-02-24 DIAGNOSIS — E1140 Type 2 diabetes mellitus with diabetic neuropathy, unspecified: Secondary | ICD-10-CM | POA: Diagnosis present

## 2019-02-24 DIAGNOSIS — I10 Essential (primary) hypertension: Secondary | ICD-10-CM

## 2019-02-24 DIAGNOSIS — E785 Hyperlipidemia, unspecified: Secondary | ICD-10-CM | POA: Diagnosis present

## 2019-02-24 DIAGNOSIS — Z79899 Other long term (current) drug therapy: Secondary | ICD-10-CM | POA: Diagnosis not present

## 2019-02-24 DIAGNOSIS — N183 Chronic kidney disease, stage 3 unspecified: Secondary | ICD-10-CM

## 2019-02-24 DIAGNOSIS — K219 Gastro-esophageal reflux disease without esophagitis: Secondary | ICD-10-CM | POA: Diagnosis present

## 2019-02-24 DIAGNOSIS — I130 Hypertensive heart and chronic kidney disease with heart failure and stage 1 through stage 4 chronic kidney disease, or unspecified chronic kidney disease: Secondary | ICD-10-CM | POA: Diagnosis present

## 2019-02-24 DIAGNOSIS — N400 Enlarged prostate without lower urinary tract symptoms: Secondary | ICD-10-CM | POA: Diagnosis present

## 2019-02-24 DIAGNOSIS — J449 Chronic obstructive pulmonary disease, unspecified: Secondary | ICD-10-CM | POA: Diagnosis present

## 2019-02-24 DIAGNOSIS — E7800 Pure hypercholesterolemia, unspecified: Secondary | ICD-10-CM | POA: Diagnosis present

## 2019-02-24 DIAGNOSIS — R0789 Other chest pain: Secondary | ICD-10-CM | POA: Diagnosis not present

## 2019-02-24 DIAGNOSIS — J069 Acute upper respiratory infection, unspecified: Secondary | ICD-10-CM | POA: Diagnosis present

## 2019-02-24 DIAGNOSIS — I200 Unstable angina: Secondary | ICD-10-CM

## 2019-02-24 DIAGNOSIS — F40240 Claustrophobia: Secondary | ICD-10-CM | POA: Diagnosis present

## 2019-02-24 DIAGNOSIS — I5032 Chronic diastolic (congestive) heart failure: Secondary | ICD-10-CM | POA: Diagnosis present

## 2019-02-24 DIAGNOSIS — Z7984 Long term (current) use of oral hypoglycemic drugs: Secondary | ICD-10-CM | POA: Diagnosis not present

## 2019-02-24 DIAGNOSIS — M109 Gout, unspecified: Secondary | ICD-10-CM | POA: Diagnosis present

## 2019-02-24 DIAGNOSIS — I25119 Atherosclerotic heart disease of native coronary artery with unspecified angina pectoris: Secondary | ICD-10-CM | POA: Diagnosis not present

## 2019-02-24 DIAGNOSIS — Z87891 Personal history of nicotine dependence: Secondary | ICD-10-CM | POA: Diagnosis not present

## 2019-02-24 DIAGNOSIS — Z7902 Long term (current) use of antithrombotics/antiplatelets: Secondary | ICD-10-CM | POA: Diagnosis not present

## 2019-02-24 DIAGNOSIS — G4733 Obstructive sleep apnea (adult) (pediatric): Secondary | ICD-10-CM | POA: Diagnosis present

## 2019-02-24 DIAGNOSIS — Z20828 Contact with and (suspected) exposure to other viral communicable diseases: Secondary | ICD-10-CM | POA: Diagnosis present

## 2019-02-24 DIAGNOSIS — Z7982 Long term (current) use of aspirin: Secondary | ICD-10-CM | POA: Diagnosis not present

## 2019-02-24 DIAGNOSIS — I25110 Atherosclerotic heart disease of native coronary artery with unstable angina pectoris: Secondary | ICD-10-CM | POA: Diagnosis present

## 2019-02-24 DIAGNOSIS — D649 Anemia, unspecified: Secondary | ICD-10-CM | POA: Diagnosis present

## 2019-02-24 DIAGNOSIS — Z8673 Personal history of transient ischemic attack (TIA), and cerebral infarction without residual deficits: Secondary | ICD-10-CM | POA: Diagnosis not present

## 2019-02-24 LAB — HEPARIN LEVEL (UNFRACTIONATED)
Heparin Unfractionated: 0.86 IU/mL — ABNORMAL HIGH (ref 0.30–0.70)
Heparin Unfractionated: 0.8 IU/mL — ABNORMAL HIGH (ref 0.30–0.70)

## 2019-02-24 LAB — GLUCOSE, CAPILLARY
Glucose-Capillary: 147 mg/dL — ABNORMAL HIGH (ref 70–99)
Glucose-Capillary: 137 mg/dL — ABNORMAL HIGH (ref 70–99)
Glucose-Capillary: 139 mg/dL — ABNORMAL HIGH (ref 70–99)

## 2019-02-24 LAB — HEMOGLOBIN A1C
Hgb A1c MFr Bld: 7 % — ABNORMAL HIGH (ref 4.8–5.6)
Mean Plasma Glucose: 154.2 mg/dL

## 2019-02-24 LAB — TROPONIN I (HIGH SENSITIVITY): Troponin I (High Sensitivity): 36 ng/L — ABNORMAL HIGH (ref <18)

## 2019-02-24 LAB — MAGNESIUM: Magnesium: 1.6 mg/dL — ABNORMAL LOW (ref 1.7–2.4)

## 2019-02-24 MED ORDER — SODIUM CHLORIDE 0.9 % IV SOLN
INTRAVENOUS | Status: DC
  Administered 2019-02-24 (×2): via INTRAVENOUS

## 2019-02-24 MED ORDER — SODIUM CHLORIDE 0.9 % IV SOLN: INTRAVENOUS | Status: DC

## 2019-02-24 MED ORDER — ASPIRIN 81 MG PO CHEW
324.0000 mg | CHEWABLE_TABLET | Freq: Once | ORAL | Status: AC
  Administered 2019-02-24: 04:00:00 324 mg via ORAL
  Filled 2019-02-24: qty 4

## 2019-02-24 MED ORDER — HEPARIN BOLUS VIA INFUSION
3000.0000 [IU] | Freq: Once | INTRAVENOUS | Status: AC
  Administered 2019-02-24: 05:00:00 3000 [IU] via INTRAVENOUS
  Filled 2019-02-24: qty 3000

## 2019-02-24 MED ORDER — HEPARIN (PORCINE) 25000 UT/250ML-% IV SOLN
900.0000 [IU]/h | INTRAVENOUS | Status: DC
  Administered 2019-02-24: 05:00:00 1300 [IU]/h via INTRAVENOUS
  Filled 2019-02-24 (×2): qty 250

## 2019-02-24 MED ORDER — ACETAMINOPHEN 325 MG PO TABS: 650.0000 mg | ORAL_TABLET | ORAL | Status: DC | PRN

## 2019-02-24 MED ORDER — ASPIRIN EC 81 MG PO TBEC
81.0000 mg | DELAYED_RELEASE_TABLET | Freq: Every day | ORAL | Status: DC
  Administered 2019-02-25: 81 mg via ORAL
  Filled 2019-02-24: qty 1

## 2019-02-24 MED ORDER — TRAMADOL HCL 50 MG PO TABS
50.0000 mg | ORAL_TABLET | Freq: Two times a day (BID) | ORAL | Status: DC
  Administered 2019-02-24 (×2): 50 mg via ORAL
  Filled 2019-02-24 (×2): qty 1

## 2019-02-24 MED ORDER — ONDANSETRON HCL 4 MG/2ML IJ SOLN: 4.0000 mg | Freq: Four times a day (QID) | INTRAMUSCULAR | Status: DC | PRN

## 2019-02-24 MED ORDER — ASPIRIN 81 MG PO CHEW: 81.0000 mg | CHEWABLE_TABLET | Freq: Every day | ORAL | Status: DC

## 2019-02-24 MED ORDER — TRAMADOL HCL 50 MG PO TABS: 50.0000 mg | ORAL_TABLET | Freq: Every evening | ORAL | Status: DC | PRN

## 2019-02-24 MED ORDER — GABAPENTIN 300 MG PO CAPS
300.0000 mg | ORAL_CAPSULE | Freq: Every day | ORAL | Status: DC
  Administered 2019-02-24: 300 mg via ORAL
  Filled 2019-02-24: qty 1

## 2019-02-24 MED ORDER — INSULIN ASPART 100 UNIT/ML ~~LOC~~ SOLN
0.0000 [IU] | SUBCUTANEOUS | Status: DC
  Administered 2019-02-24: 2 [IU] via SUBCUTANEOUS

## 2019-02-24 MED ORDER — ATORVASTATIN CALCIUM 80 MG PO TABS
80.0000 mg | ORAL_TABLET | Freq: Every day | ORAL | Status: DC
  Administered 2019-02-24: 80 mg via ORAL
  Filled 2019-02-24: qty 1

## 2019-02-24 MED ORDER — CLOPIDOGREL BISULFATE 75 MG PO TABS
75.0000 mg | ORAL_TABLET | Freq: Every day | ORAL | Status: DC
  Administered 2019-02-24 – 2019-02-25 (×2): 75 mg via ORAL
  Filled 2019-02-24 (×2): qty 1

## 2019-02-24 MED ORDER — ISOSORBIDE MONONITRATE ER 60 MG PO TB24
120.0000 mg | ORAL_TABLET | Freq: Every day | ORAL | Status: DC
  Administered 2019-02-24: 10:00:00 120 mg via ORAL
  Filled 2019-02-24 (×2): qty 2

## 2019-02-24 MED ORDER — GUAIFENESIN 200 MG PO TABS
400.0000 mg | ORAL_TABLET | Freq: Every evening | ORAL | Status: DC | PRN
  Administered 2019-02-25: 400 mg via ORAL
  Filled 2019-02-24 (×2): qty 2

## 2019-02-24 MED ORDER — NITROGLYCERIN IN D5W 200-5 MCG/ML-% IV SOLN
10.0000 ug/min | INTRAVENOUS | Status: DC
  Administered 2019-02-24: 10 ug/min via INTRAVENOUS
  Filled 2019-02-24: qty 250

## 2019-02-24 MED ORDER — SODIUM CHLORIDE 0.9% FLUSH: 3.0000 mL | INTRAVENOUS | Status: DC | PRN

## 2019-02-24 MED ORDER — GABAPENTIN 600 MG PO TABS: 300.0000 mg | ORAL_TABLET | Freq: Once | ORAL | Status: DC

## 2019-02-24 MED ORDER — PRIMIDONE 50 MG PO TABS
50.0000 mg | ORAL_TABLET | Freq: Every day | ORAL | Status: DC
  Administered 2019-02-24: 50 mg via ORAL
  Filled 2019-02-24 (×2): qty 1

## 2019-02-24 MED ORDER — ALPRAZOLAM 0.25 MG PO TABS: 0.2500 mg | ORAL_TABLET | Freq: Every evening | ORAL | Status: DC | PRN

## 2019-02-24 MED ORDER — ALBUTEROL SULFATE (2.5 MG/3ML) 0.083% IN NEBU: 2.5000 mg | INHALATION_SOLUTION | Freq: Four times a day (QID) | RESPIRATORY_TRACT | Status: DC | PRN

## 2019-02-24 MED ORDER — SODIUM CHLORIDE 0.9% FLUSH
3.0000 mL | Freq: Two times a day (BID) | INTRAVENOUS | Status: DC
  Administered 2019-02-24: 3 mL via INTRAVENOUS

## 2019-02-24 MED ORDER — NITROGLYCERIN 0.4 MG SL SUBL
0.4000 mg | SUBLINGUAL_TABLET | SUBLINGUAL | Status: DC | PRN
  Administered 2019-02-24: 10:00:00 0.4 mg via SUBLINGUAL
  Filled 2019-02-24: qty 1

## 2019-02-24 MED ORDER — SODIUM CHLORIDE 0.9 % IV SOLN: 250.0000 mL | INTRAVENOUS | Status: DC | PRN

## 2019-02-24 NOTE — ED Provider Notes (Addendum)
TIME SEEN: 3:41 AM  CHIEF COMPLAINT: Chest pain  HPI: Patient is an 84 year old male with history of CAD status post stent, CKD, hypertension, hyperlipidemia, diabetes, COPD, stroke on Plavix who presents emergency department chest pain.  States for the past week he has had intermittent chest pain.  States he has always had chest pressure that resolved with nitro for the past 2 years.  States however initially it would take more nitro to control his pain and then he noticed that it would take longer than normal for the nitro to relieve his pain and then he became concerned tonight because nitroglycerin did not get rid of his pain at all.  His pain did resolve here in the emergency department but he denies aggravating or alleviating factors.  No shortness of breath, nausea or vomiting, diaphoresis or dizziness.  Has chronic pain in his legs from neuropathy but no swelling.  No calf tenderness.  No fevers or cough.  Recently tested negative for Covid as he is scheduled for a cardiac catheterization tomorrow.  His cardiologist is Dr. Tamala Julian.  PCP is Dr. Lona Kettle with [facility].   Echo 10/14/2017:  Study Conclusions  - Left ventricle: The cavity size was normal. There was mild   concentric hypertrophy. Systolic function was normal. The   estimated ejection fraction was in the range of 55% to 60%. Wall   motion was normal; there were no regional wall motion   abnormalities. Doppler parameters are consistent with abnormal   left ventricular relaxation (grade 1 diastolic dysfunction).   Doppler parameters are consistent with elevated ventricular   end-diastolic filling pressure. - Aortic valve: There was mild stenosis. There was no   regurgitation. - Left atrium: The atrium was mildly dilated. - Right ventricle: The cavity size was normal. Wall thickness was   normal. Systolic function was normal. - Right atrium: The atrium was normal in size. - Tricuspid valve: There was no regurgitation. -  Pulmonary arteries: Systolic pressure was within the normal   range. - Inferior vena cava: The vessel was normal in size. - Pericardium, extracardiac: A trivial pericardial effusion was   identified.  ROS: See HPI Constitutional: no fever  Eyes: no drainage  ENT: no runny nose   Cardiovascular:  no chest pain  Resp: no SOB  GI: no vomiting GU: no dysuria Integumentary: no rash  Allergy: no hives  Musculoskeletal: no leg swelling  Neurological: no slurred speech ROS otherwise negative  PAST MEDICAL HISTORY/PAST SURGICAL HISTORY:  Past Medical History:  Diagnosis Date  . Anemia    low iron  . Arthritis   . BPH (benign prostatic hyperplasia)   . Bruises easily   . CAD (coronary artery disease)    a. per Dr. Thompson Caul note, prior stenting of Cx in 2007 and 2012. b. DES to Cx in 09/2012.  [name] Chronic diastolic CHF (congestive heart failure) ([place])   . CKD (chronic kidney disease), stage [name]   . Claustrophobia   . Complication of anesthesia    has to have head elevated to keep from strangling on post nasal drip  . COPD (chronic obstructive pulmonary disease) ([place])   . Diabetes mellitus type II    type 2  . Diarrhea 2015   had for a year and a half  . Dyspnea   . GERD (gastroesophageal reflux disease)   . Gout   . Granuloma annulare   . History of hiatal hernia   . History of kidney stones    Litthotrispy  .  Hyperlipidemia   . Hypertension   . Neuropathy   . Obesity   . OSA (obstructive sleep apnea)   . Right sided weakness   . Stroke ([place]) 10/2017   Weakness right arm and leg  . Wears dentures    top  . Wears glasses     MEDICATIONS:  Prior to Admission medications   Medication Sig Start Date End Date Taking? Authorizing Provider  albuterol (PROVENTIL HFA) 108 (90 BASE) MCG/ACT inhaler Inhale 2 puffs into the lungs every 6 (six) hours as needed for wheezing or shortness of breath.     [provider]  albuterol (PROVENTIL) (2.5 MG/3ML) 0.083% nebulizer  solution Take 2.5 mg by nebulization every 6 (six) hours as needed for wheezing or shortness of breath.     [provider]  allopurinol (ZYLOPRIM) 100 MG tablet Take 100 mg by mouth daily.    [provider]  amLODipine (NORVASC) 10 MG tablet Take 1 tablet (10 mg total) by mouth daily. 10/15/17 02/21/19  Thurnell Lose, MD  aspirin EC 81 MG tablet Take 1 tablet (81 mg total) by mouth daily. 02/20/19   Belva Crome, MD  atorvastatin (LIPITOR) 80 MG tablet Take 1 tablet (80 mg total) by mouth daily at 6 PM. 10/15/17   Thurnell Lose, MD  cholecalciferol (VITAMIN D3) 25 MCG (1000 UT) tablet Take 1,000 Units by mouth daily.    [provider]  clopidogrel (PLAVIX) 75 MG tablet Take 1 tablet (75 mg total) by mouth daily. 10/16/17   Thurnell Lose, MD  famotidine (PEPCID) 20 MG tablet Take 20 mg by mouth 2 (two) times daily.    [provider]  Ferrous Sulfate (IRON) 325 (65 FE) MG TABS Take 325 mg by mouth daily.     [provider]  finasteride (PROSCAR) 5 MG tablet Take 1 tablet (5 mg total) by mouth daily. 11/29/11   Rigoberto Noel, MD  gabapentin (NEURONTIN) 300 MG capsule Take 1 capsule (300 mg total) by mouth at bedtime. 08/20/18   Frann Rider, NP  glipiZIDE (GLUCOTROL) 5 MG tablet Take 5 mg by mouth 3 (three) times daily before meals.     [provider]  guaifenesin (HUMIBID E) 400 MG TABS tablet Take 400 mg by mouth at bedtime.     [provider]  isosorbide mononitrate (IMDUR) 120 MG 24 hr tablet Take 1 tablet (120 mg total) by mouth daily. 11/20/14   Belva Crome, MD  nitroGLYCERIN (NITROSTAT) 0.4 MG SL tablet Place 0.4 mg under the tongue every 5 (five) minutes as needed for chest pain.    [provider]  primidone (MYSOLINE) 50 MG tablet Take 50 mg by mouth at bedtime.     [provider]  traMADol (ULTRAM) 50 MG tablet Take 1 tablet (50 mg total) by mouth at bedtime as needed for moderate  pain. Patient taking differently: Take 50 mg by mouth 2 (two) times daily.  10/20/17   Rhyne, Hulen Shouts, PA-C  vitamin B-12 (CYANOCOBALAMIN) 1000 MCG tablet Take 1,000 mcg by mouth daily.    [provider]    ALLERGIES:  Allergies  Allergen Reactions  . Cimetidine Other (See Comments)    Anxiety Attacks    SOCIAL HISTORY:  Social History   Tobacco Use  . Smoking status: Former Smoker    Packs/day: 1.00    Years: 64.00    Pack years: 64.00    Types: Cigarettes    Quit date:  05/29/2011    Years since quitting: 7.7  . Smokeless tobacco: Never Used  Substance Use Topics  . Alcohol use: Yes    Comment: rarely    FAMILY HISTORY: Family History  Problem Relation Age of Onset  . Aortic aneurysm Father   . Aneurysm Mother        brain  . Cancer Brother   . COPD Sister   . Other Sister        health hx unknown  . Other Sister        health hx unknown  . Other Brother        health hx unknown    EXAM: BP (!) 153/81   Pulse 68   Temp 98.2 F (36.8 C) (Oral)   Resp 19   SpO2 100%  CONSTITUTIONAL: Alert and oriented and responds appropriately to questions. Well-appearing; well-nourished, elderly HEAD: Normocephalic EYES: Conjunctivae clear, pupils appear equal, EOMI ENT: normal nose; moist mucous membranes NECK: Supple, no meningismus, no nuchal rigidity, no LAD  CARD: RRR; S1 and S2 appreciated; no murmurs, no clicks, no rubs, no gallops RESP: Normal chest excursion without splinting or tachypnea; breath sounds clear and equal bilaterally; no wheezes, no rhonchi, no rales, no hypoxia or respiratory distress, speaking full sentences ABD/GI: Normal bowel sounds; non-distended; soft, non-tender, no rebound, no guarding, no peritoneal signs, no hepatosplenomegaly BACK:  The back appears normal and is non-tender to palpation, there is no CVA tenderness EXT: Normal ROM in all joints; non-tender to palpation; no edema; normal capillary refill; no cyanosis, no calf  tenderness or swelling    SKIN: Normal color for age and race; warm; no rash NEURO: Moves all extremities equally PSYCH: The patient's mood and manner are appropriate. Grooming and personal hygiene are appropriate.  MEDICAL DECISION MAKING: Patient here with chest pain concerning for unstable angina.  Currently chest pain-free.  Will discuss with cardiology.  Does have chronic kidney disease which appears stable.  Troponins are in the mid 30s and stable.  EKG shows no new ischemic change.  ED PROGRESS:   4:00 AM Discussed with Dr. Hassell Done with cardiology.  She agrees with admission and trying to move the catheterization up to later today.  It appears per Dr. Thompson Caul note they would like the patient to be prehydrated.  She recommends IV fluids at 75 mL/h at this time.  He has had a normal EF in 2019.  He had a Covid swab 02/22/2019 that was negative.  Refuses repeat Covid swab.  Dr. Hassell Done requests medicine admission.  Cardiology will see in consult in the morning.   4:31 AM Discussed patient's case with hospitalist, Dr. Criss Alvine.  I have recommended admission and patient (and family if present) agree with this plan. Admitting physician will place admission orders.   I reviewed all nursing notes, vitals, pertinent previous records and interpreted all EKGs, lab and urine results, imaging (as available).   BLAKE VETRANO was evaluated in Emergency Department on 02/24/2019 for the symptoms described in the history of present illness. He was evaluated in the context of the global COVID-19 pandemic, which necessitated consideration that the patient might be at risk for infection with the SARS-CoV-2 virus that causes COVID-19. Institutional protocols and algorithms that pertain to the evaluation of patients at risk for COVID-19 are in a state of rapid change based on information released by regulatory bodies including the CDC and federal and state organizations. These policies and algorithms were followed  during the patient's care in the  ED.   CRITICAL CARE Performed by: Pryor Curia   Total critical care time: 45 minutes  Critical care time was exclusive of separately billable procedures and treating other patients.  Critical care was necessary to treat or prevent imminent or life-threatening deterioration.  Critical care was time spent personally by me on the following activities: development of treatment plan with patient and/or surrogate as well as nursing, discussions with consultants, evaluation of patient's response to treatment, examination of patient, obtaining history from patient or surrogate, ordering and performing treatments and interventions, ordering and review of laboratory studies, ordering and review of radiographic studies, pulse oximetry and re-evaluation of patient's condition.    EKG Interpretation  Date/Time:  Sunday February 23 2019 20:22:38 EST Ventricular Rate:  78 PR Interval:  198 QRS Duration: 74 QT Interval:  386 QTC Calculation: 440 R Axis:   28 Text Interpretation: Normal sinus rhythm Nonspecific ST abnormality Abnormal ECG Artifact When compared with ECG of 10/17/2017, No significant change was found Confirmed by Delora Fuel ([id number]) on 02/23/2019 11:01:52 PM         EKG Interpretation  Date/Time:  Monday February 24 2019 03:27:51 EST Ventricular Rate:  81 PR Interval:  198 QRS Duration: 84 QT Interval:  386 QTC Calculation: 448 R Axis:   65 Text Interpretation: Sinus rhythm Nonspecific T abnormalities, lateral leads No significant change since last tracing Confirmed by Pryor Curia [id number]) on 02/24/2019 4:07:57 AM         Ward, Delice Bison, DO 02/24/19 0408    Ward, Delice Bison, DO 02/24/19 [id number]

## 2019-02-24 NOTE — Progress Notes (Signed)
Pt seen and examined, admitted earlier this am by Dr.Cristescu -Briefly this is an 84-year-old male with history of CAD with PCI and prior circumflex stents, chronic diastolic CHF, type 2 diabetes mellitus, hypertension, dyslipidemia, history of CVA, history of carotid endarterectomy, obstructive sleep apnea, chronic kidney disease stage 3-4 -Patient started having exertional chest pain that progressed to chest pain at rest last Sunday, he called EMS after taking multiple doses of nitroglycerin, he felt a little better and deferred coming to the ER, he is saw Dr. Smith few days ago and was scheduled to have a left heart cath on Tuesday, however in the interim he started having severe exertional chest pressure and subsequently presented to the emergency room  Unstable angina/CAD -Progression of symptoms, EKG without acute ST-T wave changes, this is unchanged from prior, high-sensitivity troponin is minimally elevated -Cardiology consulted, plan for left heart cath tomorrow -He has CKD stage [name]-4, currently undergoing hydration for cath tomorrow -Continue aspirin, Plavix, IV heparin, beta-blocker, statin -Last left heart cath in 06/2016 noted 40% stenosis in LAD, circumflex, 50% proximal circumflex with 50% ISR, OM1 with 50% in-stent restenosis  Chronic kidney disease stage [name]-IV -Avoid nephrotoxins, continue IV fluids today -Be met in a.m.  Rest of his medical problems are stable as noted by Dr.Cristescu this am   , MD 

## 2019-02-24 NOTE — Progress Notes (Signed)
ANTICOAGULATION CONSULT NOTE - Initial Consult  Pharmacy Consult for heparin Indication: chest pain/ACS  Allergies  Allergen Reactions  . Cimetidine Other (See Comments)    Anxiety Attacks    Patient Measurements: Height: 5\' 8"  (172.7 cm) Weight: 200 lb (90.7 kg) IBW/kg (Calculated) : 68.4 Heparin Dosing Weight: 90kg  Vital Signs: Temp: 98.2 F (36.8 C) (11/15 2236) Temp Source: Oral (11/15 2236) BP: 139/83 (11/16 0415) Pulse Rate: 76 (11/16 0415)  Labs: Recent Labs    02/23/19 2027 02/23/19 2355  HGB 15.0  --   HCT 44.8  --   PLT 275  --   CREATININE 2.11*  --   TROPONINIHS 34* 36*    Estimated Creatinine Clearance: 29.5 mL/min (A) (by C-G formula based on SCr of 2.11 mg/dL (H)).   Medical History: Past Medical History:  Diagnosis Date  . Anemia    low iron  . Arthritis   . BPH (benign prostatic hyperplasia)   . Bruises easily   . CAD (coronary artery disease)    a. per Dr. Thompson Caul note, prior stenting of Cx in 2007 and 2012. b. DES to Cx in 09/2012.  [name] Chronic diastolic CHF (congestive heart failure) ([place])   . CKD (chronic kidney disease), stage [name]   . Claustrophobia   . Complication of anesthesia    has to have head elevated to keep from strangling on post nasal drip  . COPD (chronic obstructive pulmonary disease) ([place])   . Diabetes mellitus type II    type 2  . Diarrhea 2015   had for a year and a half  . Dyspnea   . GERD (gastroesophageal reflux disease)   . Gout   . Granuloma annulare   . History of hiatal hernia   . History of kidney stones    Litthotrispy  . Hyperlipidemia   . Hypertension   . Neuropathy   . Obesity   . OSA (obstructive sleep apnea)   . Right sided weakness   . Stroke ([place]) 10/2017   Weakness right arm and leg  . Wears dentures    top  . Wears glasses     Assessment: 84yo male c/o CP unresolved after NTG x6 at home, resolved after arrived to ED, troponin mildly elevated, to begin heparin.  Goal of Therapy:   Heparin level 0.3-0.7 units/ml Monitor platelets by anticoagulation protocol: Yes   Plan:  Will give heparin 3000 units IV bolus x1 followed by gtt at 1300 units/hr and monitor heparin levels and CBC.  Wynona Neat, PharmD, BCPS  02/24/2019,4:38 AM

## 2019-02-24 NOTE — ED Notes (Signed)
Family at bedside. 

## 2019-02-24 NOTE — Telephone Encounter (Signed)
Spoke with son, DPR on file.  He wanted to let Dr. Tamala Julian know that pt was in the ER.  Been there since Saturday night.  Son states they had mentioned possibly moving cath up to today.  Pt is pending admission. Advised I have made Dr. Tamala Julian aware that pt is at the hospital.

## 2019-02-24 NOTE — H&P (Signed)
History and Physical    Marco Acevedo ERX:[id number] DOB: 09-22-1934 DOA: 02/23/2019  PCP: Lawerance Cruel, MD    Patient coming from: Home    Chief Complaint: Chest pain off and on usually relieved with nitro   HPI: Marco Acevedo is a 84 y.o. male with medical history significant of coronary artery disease prior circumflex stents, congestive heart failure preserved ejection fraction diastolic dysfunction, hyperlipidemia, hypertension, diabetes mellitus type 2, history of CVA in 2019, status post left carotid endarterectomy, obstructive sleep apnea, chronic kidney disease came with a chief complaint of chest pain which did not subside after taking nitroglycerin at home.  Finally the pain subsided but he continues to have chest pain off-and-on even in the emergency room. Patient for the last 7 days has experiencing aching in his chest.  This was severe on Sunday evening November 8.  Sublingual nitroglycerin was used.  There were some relief.  He was evaluated at home by EMS and was recommended to come to the hospital which he refused.  Next day he played golf and he had 2 additional episodes of chest pain.  Each time his nitroglycerin improved his discomfort.  ED Course:  Hemodynamically stable.  Still some chest discomfort  Electrocardiogram sinus no acute ST-T changes Troponin mildly elevated 34 and 36.  No fevers or cough.  Recently tested negative for Covid was scheduled for a cardiac catheterization tomorrow.  His cardiologist is Dr. Tamala Julian.  PCP is Dr. Lona Kettle with [facility]. Because of the pain he is going to drink today   Emergency room physician discussed the case with the cardiologist who recommended admission for cardiac catheterization today Review of Systems: As per HPI otherwise 10 point review of systems negative.  Except chest pain  Past Medical History:  Diagnosis Date  . Anemia    low iron  . Arthritis   . BPH (benign prostatic hyperplasia)   . Bruises easily    . CAD (coronary artery disease)    a. per Dr. Thompson Caul note, prior stenting of Cx in 2007 and 2012. b. DES to Cx in 09/2012.  [name] Chronic diastolic CHF (congestive heart failure) ([place])   . CKD (chronic kidney disease), stage [name]   . Claustrophobia   . Complication of anesthesia    has to have head elevated to keep from strangling on post nasal drip  . COPD (chronic obstructive pulmonary disease) ([place])   . Diabetes mellitus type II    type 2  . Diarrhea 2015   had for a year and a half  . Dyspnea   . GERD (gastroesophageal reflux disease)   . Gout   . Granuloma annulare   . History of hiatal hernia   . History of kidney stones    Litthotrispy  . Hyperlipidemia   . Hypertension   . Neuropathy   . Obesity   . OSA (obstructive sleep apnea)   . Right sided weakness   . Stroke ([place]) 10/2017   Weakness right arm and leg  . Wears dentures    top  . Wears glasses     Past Surgical History:  Procedure Laterality Date  . BLEPHAROPLASTY    . CARDIAC CATHETERIZATION  [phone]   X 2 stents  . CHOLECYSTECTOMY N/A 09/23/2014   Procedure: LAPAROSCOPIC CHOLECYSTECTOMY WITH INTRAOPERATIVE CHOLANGIOGRAM;  Surgeon: Autumn Messing [name], MD;  Location: [place];  Service: General;  Laterality: N/A;  . COLONOSCOPY    . COLONOSCOPY WITH PROPOFOL N/A 03/22/2016   Procedure:  COLONOSCOPY WITH PROPOFOL;  Surgeon: Wilford Corner, MD;  Location: [place];  Service: Endoscopy;  Laterality: N/A;  . ENDARTERECTOMY Left 10/19/2017   Procedure: ENDARTERECTOMY CAROTID LEFT;  Surgeon: [name], MD;  Location: [facility] OR;  Service: Vascular;  Laterality: Left;  . ESOPHAGOGASTRODUODENOSCOPY (EGD) WITH PROPOFOL N/A 03/22/2016   Procedure: ESOPHAGOGASTRODUODENOSCOPY (EGD) WITH PROPOFOL;  Surgeon: Wilford Corner, MD;  Location: [facility] ENDOSCOPY;  Service: Endoscopy;  Laterality: N/A;  . EYE SURGERY     both cataracts  . KNEE ARTHROSCOPY WITH MEDIAL MENISECTOMY Right 08/19/2013   Procedure: RIGHT KNEE ARTHROSCOPY WITH  PARTIAL MEDIAL MENISECTOMY AND CHONDROPLASTY;  Surgeon: Hessie Dibble, MD;  Location: [place];  Service: Orthopedics;  Laterality: Right;  . LEFT HEART CATH AND CORONARY ANGIOGRAPHY N/A 06/28/2016   Procedure: Left Heart Cath and Coronary Angiography;  Surgeon: Belva Crome, MD;  Location: [place] CV LAB;  Service: Cardiovascular;  Laterality: N/A;  . LEFT HEART CATHETERIZATION WITH CORONARY ANGIOGRAM N/A 10/03/2011   Procedure: LEFT HEART CATHETERIZATION WITH CORONARY ANGIOGRAM;  Surgeon: Sinclair Grooms, MD;  Location[facility] CATH LAB;  Service: Cardiovascular;  Laterality: N/A;  . LEFT HEART CATHETERIZATION WITH CORONARY ANGIOGRAM N/A 09/12/2012   Procedure: LEFT HEART CATHETERIZATION WITH CORONARY ANGIOGRAM;  Surgeon: Sinclair Grooms, MD;  Location: [facility] CATH LAB;  Service: Cardiovascular;  Laterality: N/A;  . LIPOMA EXCISION     Biospy only; left parotid gland  . LITHOTRIPSY    . none    . PERCUTANEOUS CORONARY STENT INTERVENTION (PCI-S) N/A 09/17/2012   Procedure: PERCUTANEOUS CORONARY STENT INTERVENTION (PCI-S);  Surgeon: Sinclair Grooms, MD;  Location[facility] CATH LAB;  Service: Cardiovascular;  Laterality: N/A;  . TRIGGER FINGER RELEASE Left 12/21/2015   Procedure: LEFT LONG FINGER TRIGGER RELEASE ;  Surgeon: Milly Jakob, MD;  Location: [place];  Service: Orthopedics;  Laterality: Left;     reports that he quit smoking about 7 years ago. His smoking use included cigarettes. He has a 64.00 pack-year smoking history. He has never used smokeless tobacco. He reports current alcohol use. He reports that he does not use drugs.  Allergies  Allergen Reactions  . Cimetidine Other (See Comments)    Anxiety Attacks    Family History  Problem Relation Age of Onset  . Aortic aneurysm Father   . Aneurysm Mother        brain  . Cancer Brother   . COPD Sister   . Other Sister        health hx unknown  . Other Sister        health hx unknown  . Other  Brother        health hx unknown     Prior to Admission medications   Medication Sig Start Date End Date Taking? Authorizing Provider  albuterol (PROVENTIL HFA) 108 (90 BASE) MCG/ACT inhaler Inhale 2 puffs into the lungs every 6 (six) hours as needed for wheezing or shortness of breath.    Yes [provider]  albuterol (PROVENTIL) (2.5 MG/3ML) 0.083% nebulizer solution Take 2.5 mg by nebulization every 6 (six) hours as needed for wheezing or shortness of breath.    Yes [provider]  allopurinol (ZYLOPRIM) 100 MG tablet Take 100 mg by mouth daily.   Yes [provider]  amLODipine (NORVASC) 10 MG tablet Take 1 tablet (10 mg total) by mouth daily. 10/15/17 02/24/28 Yes Thurnell Lose, MD  aspirin EC 81 MG tablet  Take 1 tablet (81 mg total) by mouth daily. 02/20/19  Yes Belva Crome, MD  atorvastatin (LIPITOR) 80 MG tablet Take 1 tablet (80 mg total) by mouth daily at 6 PM. 10/15/17  Yes Thurnell Lose, MD  cholecalciferol (VITAMIN D3) 25 MCG (1000 UT) tablet Take 1,000 Units by mouth daily.   Yes [provider]  clopidogrel (PLAVIX) 75 MG tablet Take 1 tablet (75 mg total) by mouth daily. 10/16/17  Yes Thurnell Lose, MD  famotidine (PEPCID) 20 MG tablet Take 20 mg by mouth 2 (two) times daily.   Yes [provider]  Ferrous Sulfate (IRON) 325 (65 FE) MG TABS Take 325 mg by mouth daily.    Yes [provider]  finasteride (PROSCAR) 5 MG tablet Take 1 tablet (5 mg total) by mouth daily. 11/29/11  Yes Rigoberto Noel, MD  gabapentin (NEURONTIN) 300 MG capsule Take 1 capsule (300 mg total) by mouth at bedtime. 08/20/18  Yes McCue, Janett Billow, NP  glipiZIDE (GLUCOTROL) 5 MG tablet Take 5 mg by mouth 3 (three) times daily before meals.    Yes [provider]  guaifenesin (HUMIBID E) 400 MG TABS tablet Take 400 mg by mouth at bedtime.    Yes [provider]  isosorbide mononitrate (IMDUR) 120 MG 24 hr tablet Take 1 tablet (120  mg total) by mouth daily. 11/20/14  Yes Belva Crome, MD  nitroGLYCERIN (NITROSTAT) 0.4 MG SL tablet Place 0.4 mg under the tongue every 5 (five) minutes as needed for chest pain.   Yes [provider]  primidone (MYSOLINE) 50 MG tablet Take 50 mg by mouth at bedtime.    Yes [provider]  traMADol (ULTRAM) 50 MG tablet Take 1 tablet (50 mg total) by mouth at bedtime as needed for moderate pain. Patient taking differently: Take 50 mg by mouth 2 (two) times daily.  10/20/17  Yes Rhyne, Hulen Shouts, PA-C  vitamin B-12 (CYANOCOBALAMIN) 1000 MCG tablet Take 1,000 mcg by mouth daily.   Yes [provider]    Physical Exam: Vitals:   02/24/19 0500 02/24/19 0515 02/24/19 0530 02/24/19 0545  BP: (!) 151/67 (!) 146/88 (!) 148/71 (!) 148/71  Pulse: 72 76 74 73  Resp: 13 19 16 18   Temp:      TempSrc:      SpO2: 97% 95% 94% 94%  Weight:      Height:        Constitutional: NAD, calm, comfortable Vitals:   02/24/19 0500 02/24/19 0515 02/24/19 0530 02/24/19 0545  BP: (!) 151/67 (!) 146/88 (!) 148/71 (!) 148/71  Pulse: 72 76 74 73  Resp: 13 19 16 18   Temp:      TempSrc:      SpO2: 97% 95% 94% 94%  Weight:      Height:       Eyes: PERRL, lids and conjunctivae normal ENMT: Mucous membranes are moist. Posterior pharynx clear of any exudate or lesions.Normal dentition.  Neck: normal, supple, no masses, no thyromegaly Respiratory: clear to auscultation bilaterally, no wheezing, no crackles. Normal respiratory effort. No accessory muscle use.  Cardiovascular: Regular rate and rhythm, no murmurs / rubs / gallops. No extremity edema. 2+ pedal pulses. No carotid bruits.  Abdomen: no tenderness, no masses palpated. No hepatosplenomegaly. Bowel sounds positive.  Musculoskeletal: no clubbing / cyanosis. No joint deformity upper and lower extremities. Good ROM, no contractures. Normal muscle tone.  Skin: no rashes, lesions, ulcers. No induration Neurologic: CN 2-12 grossly  intact. Sensation intact, DTR normal. Strength 5/5 in all 4.  Psychiatric: Normal judgment and insight. Alert and oriented x 3. Normal mood.    Labs on Admission: I have personally reviewed following labs and imaging studies  CBC: Recent Labs  Lab 02/20/19 1646 02/23/19 2027  WBC 9.7 8.9  HGB 14.8 15.0  HCT 43.1 44.8  MCV 85 86.5  PLT 309 295   Basic Metabolic Panel: Recent Labs  Lab 02/20/19 1646 02/23/19 2027  NA 139 137  K 4.7 4.4  CL 101 104  CO2 22 20*  GLUCOSE 103* 204*  BUN 46* 43*  CREATININE 2.05* 2.11*  CALCIUM 9.8 9.2   GFR: Estimated Creatinine Clearance: 29.5 mL/min (A) (by C-G formula based on SCr of 2.11 mg/dL (H)). Liver Function Tests: No results for input(s): AST, ALT, ALKPHOS, BILITOT, PROT, ALBUMIN in the last 168 hours. No results for input(s): LIPASE, AMYLASE in the last 168 hours. No results for input(s): AMMONIA in the last 168 hours. Coagulation Profile: No results for input(s): INR, PROTIME in the last 168 hours. Cardiac Enzymes: No results for input(s): CKTOTAL, CKMB, CKMBINDEX, TROPONINI in the last 168 hours. BNP (last 3 results) No results for input(s): PROBNP in the last 8760 hours. HbA1C: No results for input(s): HGBA1C in the last 72 hours. CBG: No results for input(s): GLUCAP in the last 168 hours. Lipid Profile: No results for input(s): CHOL, HDL, LDLCALC, TRIG, CHOLHDL, LDLDIRECT in the last 72 hours. Thyroid Function Tests: No results for input(s): TSH, T4TOTAL, FREET4, T3FREE, THYROIDAB in the last 72 hours. Anemia Panel: No results for input(s): VITAMINB12, FOLATE, FERRITIN, TIBC, IRON, RETICCTPCT in the last 72 hours. Urine analysis:    Component Value Date/Time   COLORURINE YELLOW (A) 10/19/2017 1008   APPEARANCEUR CLEAR (A) 10/19/2017 1008   LABSPEC 1.025 10/19/2017 1008   PHURINE 5.0 10/19/2017 1008   GLUCOSEU NEGATIVE 10/19/2017 1008   HGBUR NEGATIVE 10/19/2017 1008   BILIRUBINUR NEGATIVE 10/19/2017 1008    KETONESUR NEGATIVE 10/19/2017 1008   PROTEINUR 30 (A) 10/19/2017 1008   NITRITE NEGATIVE 10/19/2017 1008   LEUKOCYTESUR NEGATIVE 10/19/2017 1008    Radiological Exams on Admission: Dg Chest Portable 1 View  Result Date: 02/23/2019 CLINICAL DATA:  Chest pain EXAM: PORTABLE CHEST 1 VIEW COMPARISON:  10/21/2017 FINDINGS: The heart size and mediastinal contours are within normal limits. Both lungs are clear. The visualized skeletal structures are unremarkable. IMPRESSION: No acute abnormality of the lungs in AP portable projection. Electronically Signed   By: Eddie Candle M.D.   On: 02/23/2019 20:45    EKG: Independently reviewed.   Assessment/Plan Principal Problem:   Unstable angina (HCC) Active Problems:   COPD (chronic obstructive pulmonary disease) (HCC)   OSA (obstructive sleep apnea)   URI (upper respiratory infection)   Pure hypercholesterolemia   CKD (chronic kidney disease) stage 3, GFR 30-59 ml/min (HCC)   Essential hypertension   Unstable angina/ Progressive chest pain not relieved with nitro Comes and goes It usually responds to nitroglycerin Was scheduled for cardiac catheterization Because of the new symptoms We will try to do cardiac catheterization today/discuss case with cardiology by ED physician Plan IV fluids, aspirin, heparin drips n.p.o. 6 hours before the procedure.  Not sure of the time of the procedure so we will keep n.p.o. for now till early morning  Essential hypertension Resume Norvasc  Hypercholesterolemia Lipitor  Chronic anemia Ferrous sulfate and B12  Diabetes mellitus type 2 with neuropathy On Glucotrol hold Insulin sliding scale, gabapentin  Chronic kidney disease stage [name] Follow BUN and creatinine, IV fluids, avoid nephrotoxin  Status post CVA Plavix and aspirin  COPD Inhaler as needed  Obstructive sleep apnea Recommend CPAP  Gout allopurinol     DVT prophylaxis: Heparin Code Status: Full code Family Communication:  No Disposition Plan: Discharge home Consults called: By emergency room physician discussed the case with cardiology Admission status: Full code   Linard Millers Shanina Kepple MD [facility]  If 7PM-7AM, please contact night-coverage [email or web address]   02/24/2019, 5:55 AM

## 2019-02-24 NOTE — Progress Notes (Signed)
 .    Pt is feeling better  slight CP  Will start NTG drip at a fixed rate Discussed cath with patient and his son Hydrate today and tomorrow prior to cath   On heparin     Mertie Moores, MD  02/24/2019 3:56 PM    [facility] [address]  [place],   [address] Phone: [phone]; Fax: [phone]

## 2019-02-24 NOTE — Plan of Care (Signed)

## 2019-02-24 NOTE — Consult Note (Signed)
Cardiology Consultation   Patient ID: Marco Acevedo; [id number]; 12-05-1934   Admit date: 02/23/2019 Date of Consult: 02/24/2019  Referring Provider:  Pryor Curia  Primary Care Provider: Lawerance Cruel, MD Cardiologist: Daneen Schick Electrophysiologist:  NA  Reason for Consultation: CP/USA  History of Present Illness: Marco Acevedo is a 84 y.o. male w/ h/o CAD and prior multiple PCI to LCx territory, dyslipidemia, DM2, HTN, OSA, h/o CVA in 2019, s/p L CEA in 2019, CKD stage [name], who is being seen today for the evaluation of possible [place] at the request of [facility]. Pt saw Dr. Tamala Julian on 02-20-19 and described sx concerning for escalating angina and has already been scheduled for [facility] on 02-25-19; he was instructed to present to the ED if his sx worsened. He says he has been having chest discomfort for about a week now; It is a substernal "aching" that seems to respond favorably to NTG. There is associated SOB but no diaphoresis, nausea, or any other significant associated sx. The sx are occurring both at rest and with exertion. He says he took NTG SL x 6 yesterday and the pain was persistent, so he presented to the ED for further evaluation. In [facility], EKG shows no acute ischemic change, and HS trop have been 34 and 36 in setting of Cr>2. Pt is currently pain-free; ED is starting heparin IV gtt. We are consulted to possibly expedite pt's planned LHC given worsening of sx.  Past Medical History:  Diagnosis Date  . Anemia    low iron  . Arthritis   . BPH (benign prostatic hyperplasia)   . Bruises easily   . CAD (coronary artery disease)    a. per Dr. Thompson Caul note, prior stenting of Cx in 2007 and 2012. b. DES to Cx in 09/2012.  [name] Chronic diastolic CHF (congestive heart failure) ([place])   . CKD (chronic kidney disease), stage [name]   . Claustrophobia   . Complication of anesthesia    has to have head elevated to keep from strangling on post nasal drip  . COPD (chronic obstructive pulmonary  disease) ([place])   . Diabetes mellitus type II    type 2  . Diarrhea 2015   had for a year and a half  . Dyspnea   . GERD (gastroesophageal reflux disease)   . Gout   . Granuloma annulare   . History of hiatal hernia   . History of kidney stones    [place]  . Hyperlipidemia   . Hypertension   . Neuropathy   . Obesity   . OSA (obstructive sleep apnea)   . Right sided weakness   . Stroke ([place]) 10/2017   Weakness right arm and leg  . Wears dentures    top  . Wears glasses     Past Surgical History:  Procedure Laterality Date  . BLEPHAROPLASTY    . CARDIAC CATHETERIZATION  [id number]   X 2 stents  . CHOLECYSTECTOMY N/A 09/23/2014   Procedure: LAPAROSCOPIC CHOLECYSTECTOMY WITH INTRAOPERATIVE CHOLANGIOGRAM;  Surgeon: Autumn Messing [name], MD;  Location: [place];  Service: General;  Laterality: N/A;  . COLONOSCOPY    . COLONOSCOPY WITH PROPOFOL N/A 03/22/2016   Procedure: COLONOSCOPY WITH PROPOFOL;  Surgeon: Wilford Corner, MD;  Location: [facility] ENDOSCOPY;  Service: Endoscopy;  Laterality: N/A;  . ENDARTERECTOMY Left 10/19/2017   Procedure: ENDARTERECTOMY CAROTID LEFT;  Surgeon: [name], MD;  Location: [facility] OR;  Service: Vascular;  Laterality: Left;  . ESOPHAGOGASTRODUODENOSCOPY (EGD) WITH PROPOFOL N/A 03/22/2016  Procedure: ESOPHAGOGASTRODUODENOSCOPY (EGD) WITH PROPOFOL;  Surgeon: Wilford Corner, MD;  Location: [facility] ENDOSCOPY;  Service: Endoscopy;  Laterality: N/A;  . EYE SURGERY     both cataracts  . KNEE ARTHROSCOPY WITH MEDIAL MENISECTOMY Right 08/19/2013   Procedure: RIGHT KNEE ARTHROSCOPY WITH PARTIAL MEDIAL MENISECTOMY AND CHONDROPLASTY;  Surgeon: Hessie Dibble, MD;  Location: [place];  Service: Orthopedics;  Laterality: Right;  . LEFT HEART CATH AND CORONARY ANGIOGRAPHY N/A 06/28/2016   Procedure: Left Heart Cath and Coronary Angiography;  Surgeon: Belva Crome, MD;  Location: [place] CV LAB;  Service: Cardiovascular;  Laterality: N/A;  . LEFT HEART  CATHETERIZATION WITH CORONARY ANGIOGRAM N/A 10/03/2011   Procedure: LEFT HEART CATHETERIZATION WITH CORONARY ANGIOGRAM;  Surgeon: Sinclair Grooms, MD;  Location: [facility] CATH LAB;  Service: Cardiovascular;  Laterality: N/A;  . LEFT HEART CATHETERIZATION WITH CORONARY ANGIOGRAM N/A 09/12/2012   Procedure: LEFT HEART CATHETERIZATION WITH CORONARY ANGIOGRAM;  Surgeon: Sinclair Grooms, MD;  Location: [facility] CATH LAB;  Service: Cardiovascular;  Laterality: N/A;  . LIPOMA EXCISION     Biospy only; left parotid gland  . LITHOTRIPSY    . none    . PERCUTANEOUS CORONARY STENT INTERVENTION (PCI-S) N/A 09/17/2012   Procedure: PERCUTANEOUS CORONARY STENT INTERVENTION (PCI-S);  Surgeon: Sinclair Grooms, MD;  Location: [facility] CATH LAB;  Service: Cardiovascular;  Laterality: N/A;  . TRIGGER FINGER RELEASE Left 12/21/2015   Procedure: LEFT LONG FINGER TRIGGER RELEASE ;  Surgeon: Milly Jakob, MD;  Location: [place];  Service: Orthopedics;  Laterality: Left;      No current facility-administered medications on file prior to encounter.    Current Outpatient Medications on File Prior to Encounter  Medication Sig Dispense Refill  . albuterol (PROVENTIL HFA) 108 (90 BASE) MCG/ACT inhaler Inhale 2 puffs into the lungs every 6 (six) hours as needed for wheezing or shortness of breath.     [name] albuterol (PROVENTIL) (2.5 MG/3ML) 0.083% nebulizer solution Take 2.5 mg by nebulization every 6 (six) hours as needed for wheezing or shortness of breath.     . allopurinol (ZYLOPRIM) 100 MG tablet Take 100 mg by mouth daily.    [name] amLODipine (NORVASC) 10 MG tablet Take 1 tablet (10 mg total) by mouth daily. 30 tablet 11  . aspirin EC 81 MG tablet Take 1 tablet (81 mg total) by mouth daily. 90 tablet 3  . atorvastatin (LIPITOR) 80 MG tablet Take 1 tablet (80 mg total) by mouth daily at 6 PM. 30 tablet 0  . cholecalciferol (VITAMIN D3) 25 MCG (1000 UT) tablet Take 1,000 Units by mouth daily.    . clopidogrel (PLAVIX) 75  MG tablet Take 1 tablet (75 mg total) by mouth daily. 30 tablet 0  . famotidine (PEPCID) 20 MG tablet Take 20 mg by mouth 2 (two) times daily.    . Ferrous Sulfate (IRON) 325 (65 FE) MG TABS Take 325 mg by mouth daily.     . finasteride (PROSCAR) 5 MG tablet Take 1 tablet (5 mg total) by mouth daily. 30 tablet 5  . gabapentin (NEURONTIN) 300 MG capsule Take 1 capsule (300 mg total) by mouth at bedtime. 90 capsule 1  . glipiZIDE (GLUCOTROL) 5 MG tablet Take 5 mg by mouth 3 (three) times daily before meals.     [name] guaifenesin (HUMIBID E) 400 MG TABS tablet Take 400 mg by mouth at bedtime.     . isosorbide mononitrate (IMDUR) 120 MG 24 hr  tablet Take 1 tablet (120 mg total) by mouth daily. 90 tablet 3  . nitroGLYCERIN (NITROSTAT) 0.4 MG SL tablet Place 0.4 mg under the tongue every 5 (five) minutes as needed for chest pain.    . primidone (MYSOLINE) 50 MG tablet Take 50 mg by mouth at bedtime.     . traMADol (ULTRAM) 50 MG tablet Take 1 tablet (50 mg total) by mouth at bedtime as needed for moderate pain. (Patient taking differently: Take 50 mg by mouth 2 (two) times daily. )    . vitamin B-12 (CYANOCOBALAMIN) 1000 MCG tablet Take 1,000 mcg by mouth daily.        Allergies:    Allergies  Allergen Reactions  . Cimetidine Other (See Comments)    Anxiety Attacks    Social History:   The patient  reports that he quit smoking about 7 years ago. His smoking use included cigarettes. He has a 64.00 pack-year smoking history. He has never used smokeless tobacco. He reports current alcohol use. He reports that he does not use drugs.    Family History:   The patient's family history includes Aneurysm in his mother; Aortic aneurysm in his father; COPD in his sister; Cancer in his brother; Other in his brother, sister, and sister.   ROS:  Please see the history of present illness.  All other ROS reviewed and negative.     Vital Signs: Blood pressure (!) 153/81, pulse 68, temperature 98.2 F (36.8 C),  temperature source Oral, resp. rate 19, height 5\' 8"  (1.727 m), weight 90.7 kg, SpO2 100 %.   PHYSICAL EXAM: General:  Well nourished, well developed, in no acute distress HEENT: normal Lymph: no adenopathy Neck: no JVD Endocrine:  No thryomegaly Vascular: No carotid bruits; DP pulses 2+ bilaterally   Cardiac:  normal S1, S2; RRR; no murmur  Lungs:  clear to auscultation bilaterally, no wheezing, rhonchi or rales  Abd: soft, nontender, no hepatomegaly  Ext: no edema Musculoskeletal:  No deformities, BUE and BLE strength normal and equal Skin: warm and dry  Neuro:  CNs 2-12 intact, no focal abnormalities noted Psych:  Normal affect   EKG:  NSR with nonspecific ST changes  Labs: No results for input(s): CKTOTAL, CKMB, TROPONINI in the last 72 hours. No results for input(s): TROPIPOC in the last 72 hours.  Lab Results  Component Value Date   WBC 8.9 02/23/2019   HGB 15.0 02/23/2019   HCT 44.8 02/23/2019   MCV 86.5 02/23/2019   PLT 275 02/23/2019   Recent Labs  Lab 02/23/19 2027  NA 137  K 4.4  CL 104  CO2 20*  BUN 43*  CREATININE 2.11*  CALCIUM 9.2  GLUCOSE 204*   Lab Results  Component Value Date   CHOL 220 (H) 10/14/2017   HDL 39 (L) 10/14/2017   LDLCALC 150 (H) 10/14/2017   TRIG 156 (H) 10/14/2017   No results found for: DDIMER   HS troponin  34---->36  Radiology/Studies:  Dg Chest Portable 1 View  Result Date: 02/23/2019 CLINICAL DATA:  Chest pain EXAM: PORTABLE CHEST 1 VIEW COMPARISON:  10/21/2017 FINDINGS: The heart size and mediastinal contours are within normal limits. Both lungs are clear. The visualized skeletal structures are unremarkable. IMPRESSION: No acute abnormality of the lungs in AP portable projection. Electronically Signed   By: Eddie Candle M.D.   On: 02/23/2019 20:45   LHC 06-28-16  No major change since the post PCI angiogram in 2014.   There is calcification with  globular proximal LAD 40% stenosis.  The circumflex ostium contains  eccentric 40% narrowing followed by proximal circumflex stent which contains up to 50% ISR. The non-stented mid segment contains diffuse 50-60% narrowing. The distal first obtuse marginal stent contains 50-60% proximal edge in-stent restenosis.  The right coronary contains diffuse disease up to 50% throughout the proximal mid and distal segment.   Left ventriculography was not performed because of kidney impairment. LVEDP was markedly elevated at 27 mmHg.  RECOMMENDATIONS:   Aggressive blood pressure control. Add amlodipine 5 mg daily. May need low-dose diuretic therapy  Sublingual nitroglycerin as needed to control angina. Continue Ranexa as previously prescribed.  Clinical follow-up in 7-10 days.  Follow kidney function closely after contrast exposure, 95 cc used.  TTE 10-14-17 Left ventricle: The cavity size was normal. There was mild   concentric hypertrophy. Systolic function was normal. The   estimated ejection fraction was in the range of 55% to 60%. Wall   motion was normal; there were no regional wall motion   abnormalities. Doppler parameters are consistent with abnormal   left ventricular relaxation (grade 1 diastolic dysfunction).   Doppler parameters are consistent with elevated ventricular   end-diastolic filling pressure. - Aortic valve: There was mild stenosis. There was no   regurgitation. - Left atrium: The atrium was mildly dilated. - Right ventricle: The cavity size was normal. Wall thickness was   normal. Systolic function was normal. - Right atrium: The atrium was normal in size. - Tricuspid valve: There was no regurgitation. - Pulmonary arteries: Systolic pressure was within the normal   range. - Inferior vena cava: The vessel was normal in size. - Pericardium, extracardiac: A trivial pericardial effusion was   identified.  ASSESSMENT AND PLAN:  1. CP/[place]: in setting of prior known CAD and multiple PCI. No acute ischemia on EKG, enzymes currently not  suggestive of MI. Scheduled for LHC on 02-25-19 and needs pre-hydration due to CKD. Will begin gentle hydration; cath may not be able to be moved any sooner than planned in order to maximally protect the kidneys unless pt has refractory pain, evidence of acute EKG changes, or significant increase in HS troponin.   2. HTN/DM/dyslipidemia: restart pre-hospital med regimen and adjust as necessary.  3. CKD stage [name]: will pre-hydrate in anticipation of dye load as above  Thank you for the opportunity to participate in the care of this patient.  For questions or updates, please contact [place] Please consult [email or web address] for contact info under   Signed, Rudean Curt, MD, [facility]  02/24/2019 4:03 AM

## 2019-02-24 NOTE — Progress Notes (Signed)
ANTICOAGULATION CONSULT NOTE - Follow Up Consult  Pharmacy Consult for Heparin Indication: chest pain/ACS  Allergies  Allergen Reactions  . Cimetidine Other (See Comments)    Anxiety Attacks    Patient Measurements: Height: 5\' 8"  (172.7 cm) Weight: 200 lb (90.7 kg) IBW/kg (Calculated) : 68.4 Heparin Dosing Weight:  87.1 kg  Vital Signs: Temp: 99.9 F (37.7 C) (11/16 1251) Temp Source: Oral (11/16 1251) BP: 150/86 (11/16 1251) Pulse Rate: 86 (11/16 1251)  Labs: Recent Labs    02/23/19 2027 02/23/19 2355 02/24/19 1313  HGB 15.0  --   --   HCT 44.8  --   --   PLT 275  --   --   HEPARINUNFRC  --   --  0.86*  CREATININE 2.11*  --   --   TROPONINIHS 34* 36*  --     Estimated Creatinine Clearance: 29.5 mL/min (A) (by C-G formula based on SCr of 2.11 mg/dL (H)).  Assessment: \ Anticoag: CP on IV heparin. Hep level 0.86 over goal.CBC WNL  Goal of Therapy:  Heparin level 0.3-0.7 units/ml Monitor platelets by anticoagulation protocol: Yes   Plan:  Decrease heparin to 1150 units/hr Recheck HL in 8 hrs Daily HL and CBC - f/u to resume home meds. - Cath tomorrow   Kyan Giannone S. Alford Highland, PharmD, BCPS Clinical Staff Pharmacist Eilene Ghazi [place] 02/24/2019,2:42 PM

## 2019-02-24 NOTE — Telephone Encounter (Signed)
Patient's son called stating the patient has been in the ER since last night and is scheduled for a cardiac cath for tomorrow and wanted to let Dr. Tamala Julian know. Patient's son would like to speak to nurse.

## 2019-02-24 NOTE — Progress Notes (Signed)
Per Dr. Elmarie Shiley note pt will begin ntg gtt at fixed rate. Order not entered.. pgd on call MD.

## 2019-02-25 ENCOUNTER — Ambulatory Visit (HOSPITAL_COMMUNITY)
Admission: RE | Admit: 2019-02-25 | Payer: Medicare Other | Source: Home / Self Care | Admitting: Interventional Cardiology

## 2019-02-25 ENCOUNTER — Encounter (HOSPITAL_COMMUNITY)
Admission: EM | Disposition: A | Discharge status: Home / Self Care | Payer: Self-pay | Source: Home / Self Care | Attending: Internal Medicine

## 2019-02-25 ENCOUNTER — Telehealth: Payer: Self-pay | Admitting: Physician Assistant

## 2019-02-25 ENCOUNTER — Other Ambulatory Visit: Payer: Self-pay | Admitting: Physician Assistant

## 2019-02-25 DIAGNOSIS — N183 Chronic kidney disease, stage 3 unspecified: Secondary | ICD-10-CM

## 2019-02-25 DIAGNOSIS — I25119 Atherosclerotic heart disease of native coronary artery with unspecified angina pectoris: Secondary | ICD-10-CM

## 2019-02-25 DIAGNOSIS — E785 Hyperlipidemia, unspecified: Secondary | ICD-10-CM

## 2019-02-25 HISTORY — PX: LEFT HEART CATH AND CORONARY ANGIOGRAPHY: CATH118249

## 2019-02-25 LAB — CBC
HCT: 40.3 % (ref 39.0–52.0)
Hemoglobin: 13.7 g/dL (ref 13.0–17.0)
MCH: 28.9 pg (ref 26.0–34.0)
MCHC: 34 g/dL (ref 30.0–36.0)
MCV: 85 fL (ref 80.0–100.0)
Platelets: 248 10*3/uL (ref 150–400)
RBC: 4.74 MIL/uL (ref 4.22–5.81)
RDW: 13.4 % (ref 11.5–15.5)
WBC: 7.6 10*3/uL (ref 4.0–10.5)
nRBC: 0 % (ref 0.0–0.2)

## 2019-02-25 LAB — GLUCOSE, CAPILLARY
Glucose-Capillary: 145 mg/dL — ABNORMAL HIGH (ref 70–99)
Glucose-Capillary: 131 mg/dL — ABNORMAL HIGH (ref 70–99)
Glucose-Capillary: 143 mg/dL — ABNORMAL HIGH (ref 70–99)
Glucose-Capillary: 143 mg/dL — ABNORMAL HIGH (ref 70–99)

## 2019-02-25 LAB — BASIC METABOLIC PANEL
Anion gap: 13 (ref 5–15)
BUN: 27 mg/dL — ABNORMAL HIGH (ref 8–23)
CO2: 24 mmol/L (ref 22–32)
Calcium: 9 mg/dL (ref 8.9–10.3)
Chloride: 102 mmol/L (ref 98–111)
Creatinine, Ser: 1.79 mg/dL — ABNORMAL HIGH (ref 0.61–1.24)
GFR calc Af Amer: 40 mL/min — ABNORMAL LOW (ref >60)
GFR calc non Af Amer: 35 mL/min — ABNORMAL LOW (ref >60)
Glucose, Bld: 151 mg/dL — ABNORMAL HIGH (ref 70–99)
Potassium: 4.2 mmol/L (ref 3.5–5.1)
Sodium: 139 mmol/L (ref 135–145)

## 2019-02-25 LAB — HEPARIN LEVEL (UNFRACTIONATED): Heparin Unfractionated: 0.65 IU/mL (ref 0.30–0.70)

## 2019-02-25 LAB — MRSA PCR SCREENING: MRSA by PCR: NEGATIVE

## 2019-02-25 LAB — SARS CORONAVIRUS 2 (TAT 6-24 HRS): SARS Coronavirus 2: NEGATIVE

## 2019-02-25 SURGERY — LEFT HEART CATH AND CORONARY ANGIOGRAPHY
Anesthesia: LOCAL

## 2019-02-25 MED ORDER — MIDAZOLAM HCL 2 MG/2ML IJ SOLN
INTRAMUSCULAR | Status: AC
  Filled 2019-02-25: qty 2

## 2019-02-25 MED ORDER — SODIUM CHLORIDE 0.9% FLUSH: 3.0000 mL | Freq: Two times a day (BID) | INTRAVENOUS | Status: DC

## 2019-02-25 MED ORDER — SODIUM CHLORIDE 0.9% FLUSH: 3.0000 mL | INTRAVENOUS | Status: DC | PRN

## 2019-02-25 MED ORDER — HEPARIN (PORCINE) IN NACL 1000-0.9 UT/500ML-% IV SOLN
INTRAVENOUS | Status: DC | PRN
  Administered 2019-02-25: 500 mL

## 2019-02-25 MED ORDER — HEPARIN (PORCINE) IN NACL 1000-0.9 UT/500ML-% IV SOLN
INTRAVENOUS | Status: AC
  Filled 2019-02-25: qty 1500

## 2019-02-25 MED ORDER — LIDOCAINE HCL (PF) 1 % IJ SOLN
INTRAMUSCULAR | Status: DC | PRN
  Administered 2019-02-25: 4 mL

## 2019-02-25 MED ORDER — SODIUM CHLORIDE 0.9 % IV SOLN
INTRAVENOUS | Status: DC
  Administered 2019-02-25 (×2): via INTRAVENOUS

## 2019-02-25 MED ORDER — MIDAZOLAM HCL 2 MG/2ML IJ SOLN
INTRAMUSCULAR | Status: DC | PRN
  Administered 2019-02-25 (×2): 1 mg via INTRAVENOUS

## 2019-02-25 MED ORDER — SODIUM CHLORIDE 0.9 % IV SOLN: 250.0000 mL | INTRAVENOUS | Status: DC | PRN

## 2019-02-25 MED ORDER — AMLODIPINE BESYLATE 10 MG PO TABS: 10.0000 mg | ORAL_TABLET | Freq: Every day | ORAL | Status: DC

## 2019-02-25 MED ORDER — FENTANYL CITRATE (PF) 100 MCG/2ML IJ SOLN
INTRAMUSCULAR | Status: AC
  Filled 2019-02-25: qty 2

## 2019-02-25 MED ORDER — SODIUM CHLORIDE 0.9 % IV SOLN: INTRAVENOUS | Status: DC

## 2019-02-25 MED ORDER — LIDOCAINE HCL (PF) 1 % IJ SOLN
INTRAMUSCULAR | Status: AC
  Filled 2019-02-25: qty 30

## 2019-02-25 MED ORDER — HEPARIN SODIUM (PORCINE) 1000 UNIT/ML IJ SOLN
INTRAMUSCULAR | Status: AC
  Filled 2019-02-25: qty 1

## 2019-02-25 MED ORDER — HEPARIN SODIUM (PORCINE) 1000 UNIT/ML IJ SOLN
INTRAMUSCULAR | Status: DC | PRN
  Administered 2019-02-25: 5000 [IU] via INTRAVENOUS

## 2019-02-25 MED ORDER — FENTANYL CITRATE (PF) 100 MCG/2ML IJ SOLN
INTRAMUSCULAR | Status: DC | PRN
  Administered 2019-02-25: 25 ug via INTRAVENOUS

## 2019-02-25 MED ORDER — VERAPAMIL HCL 2.5 MG/ML IV SOLN
INTRAVENOUS | Status: AC
  Filled 2019-02-25: qty 2

## 2019-02-25 MED ORDER — CHLORHEXIDINE GLUCONATE CLOTH 2 % EX PADS
6.0000 | MEDICATED_PAD | Freq: Every day | CUTANEOUS | Status: DC
  Administered 2019-02-25: 6 via TOPICAL

## 2019-02-25 MED ORDER — VERAPAMIL HCL 2.5 MG/ML IV SOLN
INTRAVENOUS | Status: DC | PRN
  Administered 2019-02-25: 13:00:00 10 mL via INTRA_ARTERIAL

## 2019-02-25 MED ORDER — MAGNESIUM SULFATE 2 GM/50ML IV SOLN
2.0000 g | INTRAVENOUS | Status: AC
  Administered 2019-02-25: 2 g via INTRAVENOUS
  Filled 2019-02-25: qty 50

## 2019-02-25 SURGICAL SUPPLY — 10 items
CATH 5FR JL3.5 JR4 ANG PIG MP (CATHETERS) ×1 IMPLANT
DEVICE RAD COMP TR BAND LRG (VASCULAR PRODUCTS) ×1 IMPLANT
GLIDESHEATH SLEND A-KIT 6F 22G (SHEATH) ×1 IMPLANT
GUIDEWIRE INQWIRE 1.5J.035X260 (WIRE) IMPLANT
INQWIRE 1.5J .035X260CM (WIRE) ×2
KIT HEART LEFT (KITS) ×2 IMPLANT
PACK CARDIAC CATHETERIZATION (CUSTOM PROCEDURE TRAY) ×2 IMPLANT
SHEATH PROBE COVER 6X72 (BAG) ×2 IMPLANT
TRANSDUCER W/STOPCOCK (MISCELLANEOUS) ×2 IMPLANT
TUBING CIL FLEX 10 FLL-RA (TUBING) ×2 IMPLANT

## 2019-02-25 NOTE — CV Procedure (Signed)
   Coronary angiography and left ventricular hemodynamic recordings via right radial approach. Multiple attempts at access using real-time vascular ultrasound.  Distal left main 20%  LAD with heavy calcification with eccentric proximal 40% narrowing. There is 60% distal narrowing. First diagonal proximal 70 to 90% stenosis unchanged.  Ostial circumflex 60-70 % narrowing similar to prior images. Proximal circumflex stent has nodular in-stent restenosis up to 30 to 40%. Diffusely diseased proximal first marginal with 50 to 70% narrowing. Distal first marginal stent widely patent.Circumflex system appears similar to 2018 images.  Right coronary with irregularities and 50 to 70% distal narrowing. No high-grade obstruction.  Left ventriculography was not performed. LVEDP was 14 mmHg.

## 2019-02-25 NOTE — Progress Notes (Signed)
Pt to cath lab. Heparin stoppped

## 2019-02-25 NOTE — Progress Notes (Signed)
Dr. Tamala Julian reviewed cath with Dr. Acie Fredrickson - felt to be stable compared to 2018. No new medication changes recommended. We will arrange f/u BMET in our office in 48 hours - I spoke to the office to help arrange this. The result will go to Dr. Tamala Julian since I am out of the office on Thursday. He also has f/u already arranged on 12/1 with APP in our office. Spoke to patient about plan and also notified internal medicine.  Aesha Agrawal PA-C

## 2019-02-25 NOTE — Progress Notes (Signed)
PROGRESS NOTE    Marco Acevedo  ZOX:[id number] DOB: 03/13/1935 DOA: 02/23/2019 PCP: Lawerance Cruel, MD  Brief Narrative:84 year old male with history of CAD with PCI and prior circumflex stents, chronic diastolic CHF, type 2 diabetes mellitus, hypertension, dyslipidemia, history of CVA, history of carotid endarterectomy, obstructive sleep apnea, chronic kidney disease stage 3-4 -Patient started having exertional chest pain that progressed to chest pain at rest last Sunday, he called EMS after taking multiple doses of nitroglycerin, he felt a little better and deferred coming to the ER, he is saw Dr. Tamala Julian few days ago and was scheduled to have a left heart cath on Tuesday, however in the interim he started having severe exertional chest pressure and subsequently presented to the emergency room   Assessment & Plan:   Unstable angina/CAD -Progression of symptoms, EKG without acute ST-T wave changes, this is unchanged from prior, high-sensitivity troponin is minimally elevated -Cardiology consulted, plan for left heart cath today - has CKD stage [name]-4, currently undergoing hydration for cath -Continue aspirin, Plavix, IV heparin, beta-blocker, statin -Last left heart cath in 06/2016 noted 40% stenosis in LAD, circumflex, 50% proximal circumflex with 50% ISR, OM1 with 50% in-stent restenosis -Per cardiology  Chronic kidney disease stage [name]-IV -Admit saline at 75 cc an hour, creatinine at baseline is around 2, this is slightly better at 1.7 now -Stop fluids 6 hours post cath  Essential hypertension Continue Norvasc  Diabetes mellitus type 2 with neuropathy -Glucotrol on hold, continue sliding scale insulin, CBGs are stable  History of CVA -Continue aspirin Plavix and statin  COPD -Stable, no wheezing at this time, nebs PRN  Obstructive sleep apnea - CPAP  DVT prophylaxis: Heparin Code Status: Full code Family Communication:  Son at bedside Disposition Plan: Discharge  home  Consultants:   Cards   Procedures:   Antimicrobials:    Subjective: -Feels better today, no further chest pain, denies any dyspnea this morning  Objective: Vitals:   02/25/19 1355 02/25/19 1400 02/25/19 1415 02/25/19 1430  BP: (!) 176/82 (!) 163/84 (!) 162/76 (!) 170/81  Pulse: 71 68 66 65  Resp: 17 17 15 15   Temp:      TempSrc:      SpO2: 96% 96% 95% 95%  Weight:      Height:        Intake/Output Summary (Last 24 hours) at 02/25/2019 1457 Last data filed at 02/25/2019 1246 Gross per 24 hour  Intake 1492.66 ml  Output 700 ml  Net 792.66 ml   Filed Weights   02/24/19 0348  Weight: 90.7 kg    Examination:  Gen: Obese pleasant male sitting up in bed AAOx3, no distress HEENT: PERRLA, Neck supple, no JVD Lungs: Poor air movement otherwise clear CVS: RRR,No Gallops,Rubs or new Murmurs Abd: soft, Non tender, non distended, BS present Extremities: No edema Skin: no new rashes Psychiatry: Judgement and insight appear normal. Mood & affect appropriate.     Data Reviewed:   CBC: Recent Labs  Lab 02/20/19 1646 02/23/19 2027 02/25/19 0540  WBC 9.7 8.9 7.6  HGB 14.8 15.0 13.7  HCT 43.1 44.8 40.3  MCV 85 86.5 85.0  PLT 309 275 811   Basic Metabolic Panel: Recent Labs  Lab 02/20/19 1646 02/23/19 2027 02/24/19 0933 02/25/19 0540  NA 139 137  --  139  K 4.7 4.4  --  4.2  CL 101 104  --  102  CO2 22 20*  --  24  GLUCOSE 103* 204*  --  151*  BUN 46* 43*  --  27*  CREATININE 2.05* 2.11*  --  1.79*  CALCIUM 9.8 9.2  --  9.0  MG  --   --  1.6*  --    GFR: Estimated Creatinine Clearance: 34.8 mL/min (A) (by C-G formula based on SCr of 1.79 mg/dL (H)). Liver Function Tests: No results for input(s): AST, ALT, ALKPHOS, BILITOT, PROT, ALBUMIN in the last 168 hours. No results for input(s): LIPASE, AMYLASE in the last 168 hours. No results for input(s): AMMONIA in the last 168 hours. Coagulation Profile: No results for input(s): INR, PROTIME in the  last 168 hours. Cardiac Enzymes: No results for input(s): CKTOTAL, CKMB, CKMBINDEX, TROPONINI in the last 168 hours. BNP (last 3 results) No results for input(s): PROBNP in the last 8760 hours. HbA1C: Recent Labs    02/24/19 0933  HGBA1C 7.0*   CBG: Recent Labs  Lab 02/24/19 2046 02/25/19 0152 02/25/19 0527 02/25/19 0741 02/25/19 1116  GLUCAP 139* 145* 131* 143* 143*   Lipid Profile: No results for input(s): CHOL, HDL, LDLCALC, TRIG, CHOLHDL, LDLDIRECT in the last 72 hours. Thyroid Function Tests: No results for input(s): TSH, T4TOTAL, FREET4, T3FREE, THYROIDAB in the last 72 hours. Anemia Panel: No results for input(s): VITAMINB12, FOLATE, FERRITIN, TIBC, IRON, RETICCTPCT in the last 72 hours. Urine analysis:    Component Value Date/Time   COLORURINE YELLOW (A) 10/19/2017 1008   APPEARANCEUR CLEAR (A) 10/19/2017 1008   LABSPEC 1.025 10/19/2017 1008   PHURINE 5.0 10/19/2017 1008   GLUCOSEU NEGATIVE 10/19/2017 1008   HGBUR NEGATIVE 10/19/2017 1008   BILIRUBINUR NEGATIVE 10/19/2017 1008   KETONESUR NEGATIVE 10/19/2017 1008   PROTEINUR 30 (A) 10/19/2017 1008   NITRITE NEGATIVE 10/19/2017 1008   LEUKOCYTESUR NEGATIVE 10/19/2017 1008   Sepsis Labs: @LABRCNTIP (procalcitonin:4,lacticidven:4)  ) Recent Results (from the past 240 hour(s))  Novel Coronavirus, NAA (Hosp order, Send-out to Ref Lab; TAT 18-24 hrs     Status: None   Collection Time: 02/22/19  5:15 PM   Specimen: Nasopharyngeal Swab; Respiratory  Result Value Ref Range Status   SARS-CoV-2, NAA NOT DETECTED NOT DETECTED Final    Comment: (NOTE) This nucleic acid amplification test was developed and its performance characteristics determined by [employer]. Nucleic acid amplification tests include PCR and TMA. This test has not been FDA cleared or approved. This test has been authorized by FDA under an Emergency Use Authorization (EUA). This test is only authorized for the duration of time the  declaration that circumstances exist justifying the authorization of the emergency use of in vitro diagnostic tests for detection of SARS-CoV-2 virus and/or diagnosis of COVID-19 infection under section 564(b)(1) of the Act, 21 U.S.C. 643PIR-5(J) (1), unless the authorization is terminated or revoked sooner. When diagnostic testing is negative, the possibility of a false negative result should be considered in the context of a patient's recent exposures and the presence of clinical signs and symptoms consistent with COVID-19. An individual without symptoms of COVID- 19 and who is not shedding SARS-CoV-2 vi rus would expect to have a negative (not detected) result in this assay. Performed At: [facility] [address] [place], [place] [id number] Rush Farmer MD KZ:[id number]    [place]  Final    Comment: Performed at [facility], [place] [address], [place], [place] [address]  SARS CORONAVIRUS 2 (TAT 6-24 HRS) Nasopharyngeal Nasal Mucosa     Status: None   Collection Time: 02/25/19  1:24 AM   Specimen: Nasal  Mucosa; Nasopharyngeal  Result Value Ref Range Status   SARS Coronavirus 2 NEGATIVE NEGATIVE Final    Comment: (NOTE) SARS-CoV-2 target nucleic acids are NOT DETECTED. The SARS-CoV-2 RNA is generally detectable in upper and lower respiratory specimens during the acute phase of infection. Negative results do not preclude SARS-CoV-2 infection, do not rule out co-infections with other pathogens, and should not be used as the sole basis for treatment or other patient management decisions. Negative results must be combined with clinical observations, patient history, and epidemiological information. The expected result is Negative. Fact Sheet for Patients: SugarRoll.be Fact Sheet for Healthcare Providers: https://www.woods-mathews.com/ This test is not yet approved or cleared by the [place] FDA  and  has been authorized for detection and/or diagnosis of SARS-CoV-2 by FDA under an Emergency Use Authorization (EUA). This EUA will remain  in effect (meaning this test can be used) for the duration of the COVID-19 declaration under Section 56 4(b)(1) of the Act, 21 U.S.C. section 360bbb-3(b)(1), unless the authorization is terminated or revoked sooner. Performed at [facility], [place] [address], [place], [place] [address]   MRSA PCR Screening     Status: None   Collection Time: 02/25/19  1:24 AM   Specimen: Nasal Mucosa; Nasopharyngeal  Result Value Ref Range Status   MRSA by PCR NEGATIVE NEGATIVE Final    Comment:        The GeneXpert MRSA Assay (FDA approved for NASAL specimens only), is one component of a comprehensive MRSA colonization surveillance program. It is not intended to diagnose MRSA infection nor to guide or monitor treatment for MRSA infections. Performed at [facility], [place] [address], [place], [place] [address]          Radiology Studies: Dg Chest Portable 1 View  Result Date: 02/23/2019 CLINICAL DATA:  Chest pain EXAM: PORTABLE CHEST 1 VIEW COMPARISON:  10/21/2017 FINDINGS: The heart size and mediastinal contours are within normal limits. Both lungs are clear. The visualized skeletal structures are unremarkable. IMPRESSION: No acute abnormality of the lungs in AP portable projection. Electronically Signed   By: Eddie Candle M.D.   On: 02/23/2019 20:45        Scheduled Meds: . [MAR Hold] amLODipine  10 mg Oral Daily  . [MAR Hold] aspirin EC  81 mg Oral Daily  . [MAR Hold] atorvastatin  80 mg Oral q1800  . [MAR Hold] Chlorhexidine Gluconate Cloth  6 each Topical Q0600  . [MAR Hold] clopidogrel  75 mg Oral Daily  . [MAR Hold] gabapentin  300 mg Oral QHS  . [MAR Hold] gabapentin  300 mg Oral Once  . [MAR Hold] insulin aspart  0-15 Units Subcutaneous Q4H  . [MAR Hold] primidone  50 mg Oral QHS  . [MAR Hold] sodium chloride flush  3  mL Intravenous Q12H  . sodium chloride flush  3 mL Intravenous Q12H  . [MAR Hold] traMADol  50 mg Oral BID   Continuous Infusions: . sodium chloride 75 mL/hr at 02/24/19 0900  . [MAR Hold] sodium chloride    . sodium chloride    . sodium chloride 75 mL/hr at 02/25/19 0953  . heparin Stopped (02/25/19 1228)  . [MAR Hold] nitroGLYCERIN 10 mcg/min (02/24/19 1909)     LOS: 1 day    Time spent: 27min    Domenic Polite, MD [facility] Page via [email or web address], password [email or web address] After 7PM please contact night-coverage  02/25/2019, 2:57 PM

## 2019-02-25 NOTE — Progress Notes (Signed)
TR Band Placement- right  Amount of air in band- 0  Time air was removed from band- 1615  Dressing placed- Gauze and Tegaderm  Level prior to band removal- 1  Level after removal-  1  Post Education- Yes   Comments- Patients wrist is bruised.

## 2019-02-25 NOTE — Progress Notes (Addendum)
Progress Note  Patient Name: Marco Acevedo Date of Encounter: 02/25/2019  Primary Cardiologist: Sinclair Grooms, MD  Subjective   Had brief recurrence of CP this AM but currently resting comfortably.  No SOB.  Inpatient Medications    Scheduled Meds:  aspirin EC  81 mg Oral Daily   atorvastatin  80 mg Oral q1800   Chlorhexidine Gluconate Cloth  6 each Topical Q0600   clopidogrel  75 mg Oral Daily   gabapentin  300 mg Oral QHS   gabapentin  300 mg Oral Once   insulin aspart  0-15 Units Subcutaneous Q4H   primidone  50 mg Oral QHS   sodium chloride flush  3 mL Intravenous Q12H   sodium chloride flush  3 mL Intravenous Q12H   traMADol  50 mg Oral BID   Continuous Infusions:  sodium chloride 75 mL/hr at 02/24/19 0900   sodium chloride     sodium chloride     sodium chloride 75 mL/hr at 02/25/19 0532   heparin 900 Units/hr (02/25/19 0036)   magnesium sulfate bolus IVPB     nitroGLYCERIN 10 mcg/min (02/24/19 1909)   PRN Meds: sodium chloride, sodium chloride, acetaminophen, albuterol, ALPRAZolam, guaiFENesin, nitroGLYCERIN, ondansetron (ZOFRAN) IV, sodium chloride flush, sodium chloride flush   Vital Signs    Vitals:   02/24/19 1822 02/24/19 2118 02/25/19 0112 02/25/19 0500  BP: (!) 161/82 (!) 151/76 137/85 (!) 149/77  Pulse: 76 72 71 69  Resp: 18 17 17 18   Temp: 98.9 F (37.2 C) 97.7 F (36.5 C) 97.7 F (36.5 C) 97.8 F (36.6 C)  TempSrc: Oral Oral Oral Oral  SpO2: 95% 94% 95% 93%  Weight:      Height:        Intake/Output Summary (Last 24 hours) at 02/25/2019 0948 Last data filed at 02/25/2019 0529 Gross per 24 hour  Intake 1492.66 ml  Output 600 ml  Net 892.66 ml   Last 3 Weights 02/24/2019 02/20/2019 09/04/2018  Weight (lbs) 200 lb 206 lb 12.8 oz 206 lb  Weight (kg) 90.719 kg 93.804 kg 93.441 kg     Telemetry    NSR - Personally Reviewed  Physical Exam   GEN: No acute distress.  HEENT: Normocephalic, atraumatic, sclera  non-icteric. Neck: No JVD or bruits. Cardiac: RRR no murmurs, rubs, or gallops.  Radials/DP/PT 1+ and equal bilaterally.  Respiratory: Clear to auscultation bilaterally. Breathing is unlabored. GI: Soft, nontender, non-distended, BS +x 4. MS: no deformity. Extremities: No clubbing or cyanosis. No edema. Distal pedal pulses are 2+ and equal bilaterally. Neuro:  AAOx3. Follows commands. Psych:  Responds to questions appropriately with a normal affect.  Labs    High Sensitivity Troponin:   Recent Labs  Lab 02/23/19 2027 02/23/19 2355  TROPONINIHS 34* 36*      Cardiac EnzymesNo results for input(s): TROPONINI in the last 168 hours. No results for input(s): TROPIPOC in the last 168 hours.   Chemistry Recent Labs  Lab 02/20/19 1646 02/23/19 2027 02/25/19 0540  NA 139 137 139  K 4.7 4.4 4.2  CL 101 104 102  CO2 22 20* 24  GLUCOSE 103* 204* 151*  BUN 46* 43* 27*  CREATININE 2.05* 2.11* 1.79*  CALCIUM 9.8 9.2 9.0  GFRNONAA 29* 28* 35*  GFRAA 34* 33* 40*  ANIONGAP  --  13 13     Hematology Recent Labs  Lab 02/20/19 1646 02/23/19 2027 02/25/19 0540  WBC 9.7 8.9 7.6  RBC 5.05 5.18 4.74  HGB 14.8 15.0 13.7  HCT 43.1 44.8 40.3  MCV 85 86.5 85.0  MCH 29.3 29.0 28.9  MCHC 34.3 33.5 34.0  RDW 13.7 13.6 13.4  PLT 309 275 248    BNPNo results for input(s): BNP, PROBNP in the last 168 hours.   DDimer No results for input(s): DDIMER in the last 168 hours.   Radiology    Dg Chest Portable 1 View  Result Date: 02/23/2019 CLINICAL DATA:  Chest pain EXAM: PORTABLE CHEST 1 VIEW COMPARISON:  10/21/2017 FINDINGS: The heart size and mediastinal contours are within normal limits. Both lungs are clear. The visualized skeletal structures are unremarkable. IMPRESSION: No acute abnormality of the lungs in AP portable projection. Electronically Signed   By: Eddie Candle M.D.   On: 02/23/2019 20:45    Cardiac Studies   2d echo 10/2017 Study Conclusions  - Left ventricle: The  cavity size was normal. There was mild   concentric hypertrophy. Systolic function was normal. The   estimated ejection fraction was in the range of 55% to 60%. Wall   motion was normal; there were no regional wall motion   abnormalities. Doppler parameters are consistent with abnormal   left ventricular relaxation (grade 1 diastolic dysfunction).   Doppler parameters are consistent with elevated ventricular   end-diastolic filling pressure. - Aortic valve: There was mild stenosis. There was no   regurgitation. - Left atrium: The atrium was mildly dilated. - Right ventricle: The cavity size was normal. Wall thickness was   normal. Systolic function was normal. - Right atrium: The atrium was normal in size. - Tricuspid valve: There was no regurgitation. - Pulmonary arteries: Systolic pressure was within the normal   range. - Inferior vena cava: The vessel was normal in size. - Pericardium, extracardiac: A trivial pericardial effusion was   identified.  Patient Profile     84 y.o. male with CAD (prior Cx stenting in 2007 and 2012, DES to prox Cx 2014), chronic diastolic CHF, hyperlipidemia, HTN, obesity, OSA, DM, hiatal hernia, neuropathy, GERD, CKD stage [name], CVA in 2019 s/p L CEA 10/2017 presented with CP concerning for [place]. Last cath 2018 with recommendation for medical therapy.  Assessment & Plan    1. CAD with progressive unstable angina - minimally elevated flat hsTroponins. Plan cath as outlined today - Cr stable at 1.79 (basline appears around 2-2.2). Continue ASA, statin, Plavix, heparin. He is not on beta blocker. I do not see any recent notes discussing this but remote note from 2014 indicated he had some limitation to the amount he was previously able to tolerate with his COPD. It seems to have disappeared from his medicine list sometime after 2014. He thinks at one point this was switched to isosorbide. Home Imdur was held in lieu of NTG drip. He could not afford Ranexa. Home  amlodipine was held for unclear reasons; will resume today. Cath discussed with patient yesterday in detail, reviewed again today with patient and he is agreeable.  2. CKD stage [name] - Cr stable today. F/u post cath as well.  3. HTN - elevated. Resume home Norvasc.  4. Carotid artery disease - s/p L CEA 10/2017, overdue for vascular surgery f/u - will need to see as OP.  5. Hyperlipidemia - last LDL in 10/2017 was 150. Recheck Lipid profile in AM with updated baseline LFTs.  6. Hypomagnesemia - primary team repleted.  For questions or updates, please contact [place] Please consult [email or web address] for contact info under Cardiology/STEMI.  Signed,  Charlie Pitter, PA-C 02/25/2019, 9:48 AM    Attending Note:   The patient was seen and examined.  Agree with assessment and plan as noted above.  Changes made to the above note as needed.  Patient seen and independently examined with Melina Copa, PA .   We discussed all aspects of the encounter. I agree with the assessment and plan as stated above.  1.  CAD :   CP is better.  For cath today  2.   CKD:   Renal function is stable  3.  HTN:   Resume Norvasc.  Cont to follow      I have spent a total of 40 minutes with patient reviewing hospital  notes , telemetry, EKGs, labs and examining patient as well as establishing an assessment and plan that was discussed with the patient. > 50% of time was spent in direct patient care.    Thayer Headings, Brooke Bonito., MD, [facility] 02/25/2019, 10:31 AM [address]  [place] Pager [phone]

## 2019-02-25 NOTE — Interval H&P Note (Signed)
Cath Lab Visit (complete for each Cath Lab visit)  Clinical Evaluation Leading to the Procedure:   ACS: Yes.    Non-ACS:    Anginal Classification: CCS [name]  Anti-ischemic medical therapy: Maximal Therapy (2 or more classes of medications)  Non-Invasive Test Results: Intermediate-risk stress test findings: cardiac mortality 1-3%/year  Prior CABG: No previous CABG      History and Physical Interval Note:  02/25/2019 12:52 PM  Marco Acevedo  has presented today for surgery, with the diagnosis of Chest Pain.  The various methods of treatment have been discussed with the patient and family. After consideration of risks, benefits and other options for treatment, the patient has consented to  Procedure(s): LEFT HEART CATH AND CORONARY ANGIOGRAPHY (N/A) as a surgical intervention.  The patient's history has been reviewed, patient examined, no change in status, stable for surgery.  I have reviewed the patient's chart and labs.  Questions were answered to the patient's satisfaction.     Belva Crome [name]

## 2019-02-25 NOTE — Telephone Encounter (Signed)
Melina Copa PA-C is calling in to triage to request a STAT BMET order to be placed on this pt, under Dr. Thompson Caul name, for this Thursday 02/27/19.  Per Melina Copa PA-C, she will tell the pt to report to our office to have his BMET drawn on this day.  This is for follow-up post cath, and for diagnosis of CKD Stage 3.  Order placed and lab appt made.

## 2019-02-25 NOTE — Telephone Encounter (Signed)
New Message:2.      Melina Copa, PA asking to speak to a Triage Nurse

## 2019-02-25 NOTE — Progress Notes (Signed)
ANTICOAGULATION CONSULT NOTE - Follow Up Consult  Pharmacy Consult for heparin Indication: chest pain/ACS  Labs: Recent Labs    02/23/19 2027 02/23/19 2355 02/24/19 1313 02/24/19 2233  HGB 15.0  --   --   --   HCT 44.8  --   --   --   PLT 275  --   --   --   HEPARINUNFRC  --   --  0.86* 0.80*  CREATININE 2.11*  --   --   --   TROPONINIHS 34* 36*  --   --     Assessment: 84yo male remains supratherapeutic on heparin after rate change; no gtt issues or signs of bleeding per RN.  Goal of Therapy:  Heparin level 0.3-0.7 units/ml   Plan:  Will decrease heparin gtt by 3 units/kg/hr to 900 units/hr and check level in 8 hours.    Wynona Neat, PharmD, BCPS  02/25/2019,12:08 AM

## 2019-02-25 NOTE — Progress Notes (Addendum)
ANTICOAGULATION CONSULT NOTE - Follow Up Consult  Pharmacy Consult for Heparin Indication: chest pain/ACS  Allergies  Allergen Reactions  . Cimetidine Other (See Comments)    Anxiety Attacks    Patient Measurements: Height: 5\' 8"  (172.7 cm) Weight: 200 lb (90.7 kg) IBW/kg (Calculated) : 68.4 Heparin Dosing Weight:  87.1 kg  Vital Signs: Temp: 97.8 F (36.6 C) (11/17 0500) Temp Source: Oral (11/17 0500) BP: 149/77 (11/17 0500) Pulse Rate: 69 (11/17 0500)  Labs: Recent Labs    02/23/19 2027 02/23/19 2355 02/24/19 1313 02/24/19 2233 02/25/19 0540 02/25/19 0718  HGB 15.0  --   --   --  13.7  --   HCT 44.8  --   --   --  40.3  --   PLT 275  --   --   --  248  --   HEPARINUNFRC  --   --  0.86* 0.80*  --  0.65  CREATININE 2.11*  --   --   --  1.79*  --   TROPONINIHS 34* 36*  --   --   --   --     Estimated Creatinine Clearance: 34.8 mL/min (A) (by C-G formula based on SCr of 1.79 mg/dL (H)).  Assessment:  Anticoag: CP on IV heparin. Hep level 0.65 in goal. CBC WNL  Plan cath 11/17.  Goal of Therapy:  Heparin level 0.3-0.7 units/ml Monitor platelets by anticoagulation protocol: Yes   Plan:  - Con't heparin at 900 units/hr - Daily HL and CBC - f/u to resume other home meds:  Norvasc, Finasteride, allopurinol, Vit D, famotidine, FESO4, glipizide, B12 - Cath 11/17 - Replace Magnesium?-ok to give 2g per Dr. Kary Kos. Alford Highland, PharmD, BCPS Clinical Staff Pharmacist Eilene Ghazi [place] 02/25/2019,8:28 AM

## 2019-02-25 NOTE — Discharge Summary (Signed)
Physician Discharge Summary  Marco Acevedo XBL:[id number] DOB: [birth year] DOA: 02/23/2019  PCP: Lawerance Cruel, MD  Admit date: 02/23/2019 Discharge date: 02/25/2019  Time spent: 45 minutes  Recommendations for Outpatient Follow-up:  1. Cardiology PA in 1 week with labs 2. Dr. Tamala Julian in 1 month   Discharge Diagnoses:  Principal Problem:   Unstable angina ([place]) Active Problems:   COPD (chronic obstructive pulmonary disease) (HCC)   OSA (obstructive sleep apnea)   URI (upper respiratory infection)   Pure hypercholesterolemia   CKD (chronic kidney disease) stage 3, GFR 30-59 ml/min (HCC)   Essential hypertension   Discharge Condition: Stable  Diet recommendation: Heart healthy  Filed Weights   02/24/19 0348  Weight: 90.7 kg    History of present illness:  84 year old male with history of CAD with PCI and prior circumflex stents, chronic diastolic CHF, type 2 diabetes mellitus, hypertension, dyslipidemia, history of CVA, history of carotid endarterectomy, obstructive sleep apnea, chronic kidney disease stage3-4 -Patient started having exertional chest pain that progressed to chest pain at rest last Sunday, he called EMS after taking multiple doses of nitroglycerin, he felt a little better and deferred coming to the ER,he is saw Dr. Tamala Julian few days ago and was scheduled to have a left heart cath on Tuesday,however in the interim he started having severe exertional chest pressure and subsequently presented to the emergency room  Hospital Course:   Unstable angina/CAD -Progression of symptoms, EKG without acute ST-T wave changes, this is unchanged from prior, high-sensitivity troponin is minimally elevated -Cardiology consulted, hydrated with normal saline prior to the cath -Underwent left heart cath by Dr. Tamala Julian this afternoon which noted LAD with heavy calcification, eccentric proximal 40% narrowing distal 60%, first diagonal 70 to 90% which is unchanged, circumflex with 60  to 70% similar to prior, proximal circumflex stent has 30 to 40% in-stent stenosis, diffusely diseased OM1 with 50 to 70% narrowing, distal OM1 stent is widely patent, circumflex system similar to 2018, right coronary with irregularities and 50 to 70% distal narrowing, no high-grade obstruction -Cardiology felt his disease was overall stable,  -He will continue aspirin, Plavix, beta-blocker, statin and Imdur and 20 mg daily -Follow-up with cardiology in 1 week with repeat labs  Chronic kidney disease stage [name]-IV -, Baseline creatinine is 2.0, this improved with hydration to 1.7 plan to recheck labs next week  Essential hypertension Continue Norvasc  Diabetes mellitus type 2 with neuropathy -Glucotrol resumed  History of CVA -Continue aspirin Plavix and statin  COPD -Stable, no wheezing at this time, nebs PRN  Obstructive sleep apnea - CPAP  Discharge Exam: Vitals:   02/25/19 1415 02/25/19 1430  BP: (!) 162/76 (!) 170/81  Pulse: 66 65  Resp: 15 15  Temp:    SpO2: 95% 95%    General: AAOx3 Cardiovascular: S1S2/RRR Respiratory: CTAB  Discharge Instructions   Discharge Instructions    Diet - low sodium heart healthy   Complete by: As directed    Increase activity slowly   Complete by: As directed      Allergies as of 02/25/2019      Reactions   Cimetidine Other (See Comments)   Anxiety Attacks      Medication List    TAKE these medications   allopurinol 100 MG tablet Commonly known as: ZYLOPRIM Take 100 mg by mouth daily.   amLODipine 10 MG tablet Commonly known as: NORVASC Take 1 tablet (10 mg total) by mouth daily.   aspirin EC 81 MG tablet  Take 1 tablet (81 mg total) by mouth daily.   atorvastatin 80 MG tablet Commonly known as: LIPITOR Take 1 tablet (80 mg total) by mouth daily at 6 PM.   cholecalciferol 25 MCG (1000 UT) tablet Commonly known as: VITAMIN D3 Take 1,000 Units by mouth daily.   clopidogrel 75 MG tablet Commonly known  as: PLAVIX Take 1 tablet (75 mg total) by mouth daily.   famotidine 20 MG tablet Commonly known as: PEPCID Take 20 mg by mouth 2 (two) times daily.   finasteride 5 MG tablet Commonly known as: PROSCAR Take 1 tablet (5 mg total) by mouth daily.   gabapentin 300 MG capsule Commonly known as: NEURONTIN Take 1 capsule (300 mg total) by mouth at bedtime.   glipiZIDE 5 MG tablet Commonly known as: GLUCOTROL Take 5 mg by mouth 3 (three) times daily before meals.   guaifenesin 400 MG Tabs tablet Commonly known as: HUMIBID E Take 400 mg by mouth at bedtime.   Iron 325 (65 Fe) MG Tabs Take 325 mg by mouth daily.   isosorbide mononitrate 120 MG 24 hr tablet Commonly known as: IMDUR Take 1 tablet (120 mg total) by mouth daily.   nitroGLYCERIN 0.4 MG SL tablet Commonly known as: NITROSTAT Place 0.4 mg under the tongue every 5 (five) minutes as needed for chest pain.   primidone 50 MG tablet Commonly known as: MYSOLINE Take 50 mg by mouth at bedtime.   Proventil HFA 108 (90 Base) MCG/ACT inhaler Generic drug: albuterol Inhale 2 puffs into the lungs every 6 (six) hours as needed for wheezing or shortness of breath.   albuterol (2.5 MG/3ML) 0.083% nebulizer solution Commonly known as: PROVENTIL Take 2.5 mg by nebulization every 6 (six) hours as needed for wheezing or shortness of breath.   traMADol 50 MG tablet Commonly known as: ULTRAM Take 1 tablet (50 mg total) by mouth at bedtime as needed for moderate pain. What changed: when to take this   vitamin B-12 1000 MCG tablet Commonly known as: CYANOCOBALAMIN Take 1,000 mcg by mouth daily.      Allergies  Allergen Reactions  . Cimetidine Other (See Comments)    Anxiety Attacks   Follow-up Information    [facility] Follow up.   Specialty: Cardiology Why: Please come to the office on Thursday morning (11/19) for repeat check of your kidney function - we are open as early as 8am. It would be helpful to  come early in the day so that we can have the results by the end of the day. Contact information: [address] [place] [phone]       Richardson Dopp T, PA-C Follow up.   Specialties: Cardiology, Physician Assistant Why: [facility] - Keep follow-up as scheduled below 12/1 with Richardson Dopp, who is one of the PAs that works with Dr. Tamala Julian. Contact information: [address] [place] [address] [phone]            The results of significant diagnostics from this hospitalization (including imaging, microbiology, ancillary and laboratory) are listed below for reference.    Significant Diagnostic Studies: Dg Chest Portable 1 View  Result Date: 02/23/2019 CLINICAL DATA:  Chest pain EXAM: PORTABLE CHEST 1 VIEW COMPARISON:  10/21/2017 FINDINGS: The heart size and mediastinal contours are within normal limits. Both lungs are clear. The visualized skeletal structures are unremarkable. IMPRESSION: No acute abnormality of the lungs in AP portable projection. Electronically Signed  By: Eddie Candle M.D.   On: 02/23/2019 20:45    Microbiology: Recent Results (from the past 240 hour(s))  Novel Coronavirus, NAA (Hosp order, Send-out to Ref Lab; TAT 18-24 hrs     Status: None   Collection Time: 02/22/19  5:15 PM   Specimen: Nasopharyngeal Swab; Respiratory  Result Value Ref Range Status   SARS-CoV-2, NAA NOT DETECTED NOT DETECTED Final    Comment: (NOTE) This nucleic acid amplification test was developed and its performance characteristics determined by [employer]. Nucleic acid amplification tests include PCR and TMA. This test has not been FDA cleared or approved. This test has been authorized by FDA under an Emergency Use Authorization (EUA). This test is only authorized for the duration of time the declaration that circumstances exist justifying the authorization of the emergency use of in vitro  diagnostic tests for detection of SARS-CoV-2 virus and/or diagnosis of COVID-19 infection under section 564(b)(1) of the Act, 21 U.S.C. 462VOJ-5(K) (1), unless the authorization is terminated or revoked sooner. When diagnostic testing is negative, the possibility of a false negative result should be considered in the context of a patient's recent exposures and the presence of clinical signs and symptoms consistent with COVID-19. An individual without symptoms of COVID- 19 and who is not shedding SARS-CoV-2 vi rus would expect to have a negative (not detected) result in this assay. Performed At: [facility] [address] [place], [place] [id number] Rush Farmer MD BZ:[id number]    [place]  Final    Comment: Performed at [facility], [place] [address], [place], [place] [address]  SARS CORONAVIRUS 2 (TAT 6-24 HRS) Nasopharyngeal Nasal Mucosa     Status: None   Collection Time: 02/25/19  1:24 AM   Specimen: Nasal Mucosa; Nasopharyngeal  Result Value Ref Range Status   SARS Coronavirus 2 NEGATIVE NEGATIVE Final    Comment: (NOTE) SARS-CoV-2 target nucleic acids are NOT DETECTED. The SARS-CoV-2 RNA is generally detectable in upper and lower respiratory specimens during the acute phase of infection. Negative results do not preclude SARS-CoV-2 infection, do not rule out co-infections with other pathogens, and should not be used as the sole basis for treatment or other patient management decisions. Negative results must be combined with clinical observations, patient history, and epidemiological information. The expected result is Negative. Fact Sheet for Patients: SugarRoll.be Fact Sheet for Healthcare Providers: https://www.woods-mathews.com/ This test is not yet approved or cleared by the [place] FDA and  has been authorized for detection and/or diagnosis of SARS-CoV-2 by FDA under an Emergency Use  Authorization (EUA). This EUA will remain  in effect (meaning this test can be used) for the duration of the COVID-19 declaration under Section 56 4(b)(1) of the Act, 21 U.S.C. section 360bbb-3(b)(1), unless the authorization is terminated or revoked sooner. Performed at [facility], [place] [address] [place], [place] [address]   MRSA PCR Screening     Status: None   Collection Time: 02/25/19  1:24 AM   Specimen: Nasal Mucosa; Nasopharyngeal  Result Value Ref Range Status   MRSA by PCR NEGATIVE NEGATIVE Final    Comment:        The GeneXpert MRSA Assay (FDA approved for NASAL specimens only), is one component of a comprehensive MRSA colonization surveillance program. It is not intended to diagnose MRSA infection nor to guide or monitor treatment for MRSA infections. Performed at [facility], [place] [address] [place] [employer] [place], [place] [address]  Labs: Basic Metabolic Panel: Recent Labs  Lab 02/20/19 1646 02/23/19 2027 02/24/19 0933 02/25/19 0540  NA 139 137  --  139  K 4.7 4.4  --  4.2  CL 101 104  --  102  CO2 22 20*  --  24  GLUCOSE 103* 204*  --  151*  BUN 46* 43*  --  27*  CREATININE 2.05* 2.11*  --  1.79*  CALCIUM 9.8 9.2  --  9.0  MG  --   --  1.6*  --    Liver Function Tests: No results for input(s): AST, ALT, ALKPHOS, BILITOT, PROT, ALBUMIN in the last 168 hours. No results for input(s): LIPASE, AMYLASE in the last 168 hours. No results for input(s): AMMONIA in the last 168 hours. CBC: Recent Labs  Lab 02/20/19 1646 02/23/19 2027 02/25/19 0540  WBC 9.7 8.9 7.6  HGB 14.8 15.0 13.7  HCT 43.1 44.8 40.3  MCV 85 86.5 85.0  PLT 309 275 248   Cardiac Enzymes: No results for input(s): CKTOTAL, CKMB, CKMBINDEX, TROPONINI in the last 168 hours. BNP: BNP (last 3 results) No results for input(s): BNP in the last 8760 hours.  ProBNP (last 3 results) No results for input(s): PROBNP in the last 8760 hours.  CBG: Recent Labs  Lab 02/24/19 2046  02/25/19 0152 02/25/19 0527 02/25/19 0741 02/25/19 1116  GLUCAP 139* 145* 131* 143* 143*       Signed:  Domenic Polite MD.  [facility] 02/25/2019, 3:38 PM

## 2019-02-25 NOTE — H&P (View-Only) (Signed)
Progress Note  Patient Name: Marco Acevedo Date of Encounter: 02/25/2019  Primary Cardiologist: Sinclair Grooms, MD  Subjective   Had brief recurrence of CP this AM but currently resting comfortably.  No SOB.  Inpatient Medications    Scheduled Meds:  aspirin EC  81 mg Oral Daily   atorvastatin  80 mg Oral q1800   Chlorhexidine Gluconate Cloth  6 each Topical Q0600   clopidogrel  75 mg Oral Daily   gabapentin  300 mg Oral QHS   gabapentin  300 mg Oral Once   insulin aspart  0-15 Units Subcutaneous Q4H   primidone  50 mg Oral QHS   sodium chloride flush  3 mL Intravenous Q12H   sodium chloride flush  3 mL Intravenous Q12H   traMADol  50 mg Oral BID   Continuous Infusions:  sodium chloride 75 mL/hr at 02/24/19 0900   sodium chloride     sodium chloride     sodium chloride 75 mL/hr at 02/25/19 0532   heparin 900 Units/hr (02/25/19 0036)   magnesium sulfate bolus IVPB     nitroGLYCERIN 10 mcg/min (02/24/19 1909)   PRN Meds: sodium chloride, sodium chloride, acetaminophen, albuterol, ALPRAZolam, guaiFENesin, nitroGLYCERIN, ondansetron (ZOFRAN) IV, sodium chloride flush, sodium chloride flush   Vital Signs    Vitals:   02/24/19 1822 02/24/19 2118 02/25/19 0112 02/25/19 0500  BP: (!) 161/82 (!) 151/76 137/85 (!) 149/77  Pulse: 76 72 71 69  Resp: 18 17 17 18   Temp: 98.9 F (37.2 C) 97.7 F (36.5 C) 97.7 F (36.5 C) 97.8 F (36.6 C)  TempSrc: Oral Oral Oral Oral  SpO2: 95% 94% 95% 93%  Weight:      Height:        Intake/Output Summary (Last 24 hours) at 02/25/2019 0948 Last data filed at 02/25/2019 0529 Gross per 24 hour  Intake 1492.66 ml  Output 600 ml  Net 892.66 ml   Last 3 Weights 02/24/2019 02/20/2019 09/04/2018  Weight (lbs) 200 lb 206 lb 12.8 oz 206 lb  Weight (kg) 90.719 kg 93.804 kg 93.441 kg     Telemetry    NSR - Personally Reviewed  Physical Exam   GEN: No acute distress.  HEENT: Normocephalic, atraumatic, sclera  non-icteric. Neck: No JVD or bruits. Cardiac: RRR no murmurs, rubs, or gallops.  Radials/DP/PT 1+ and equal bilaterally.  Respiratory: Clear to auscultation bilaterally. Breathing is unlabored. GI: Soft, nontender, non-distended, BS +x 4. MS: no deformity. Extremities: No clubbing or cyanosis. No edema. Distal pedal pulses are 2+ and equal bilaterally. Neuro:  AAOx3. Follows commands. Psych:  Responds to questions appropriately with a normal affect.  Labs    High Sensitivity Troponin:   Recent Labs  Lab 02/23/19 2027 02/23/19 2355  TROPONINIHS 34* 36*      Cardiac EnzymesNo results for input(s): TROPONINI in the last 168 hours. No results for input(s): TROPIPOC in the last 168 hours.   Chemistry Recent Labs  Lab 02/20/19 1646 02/23/19 2027 02/25/19 0540  NA 139 137 139  K 4.7 4.4 4.2  CL 101 104 102  CO2 22 20* 24  GLUCOSE 103* 204* 151*  BUN 46* 43* 27*  CREATININE 2.05* 2.11* 1.79*  CALCIUM 9.8 9.2 9.0  GFRNONAA 29* 28* 35*  GFRAA 34* 33* 40*  ANIONGAP  --  13 13     Hematology Recent Labs  Lab 02/20/19 1646 02/23/19 2027 02/25/19 0540  WBC 9.7 8.9 7.6  RBC 5.05 5.18 4.74  HGB 14.8 15.0 13.7  HCT 43.1 44.8 40.3  MCV 85 86.5 85.0  MCH 29.3 29.0 28.9  MCHC 34.3 33.5 34.0  RDW 13.7 13.6 13.4  PLT 309 275 248    BNPNo results for input(s): BNP, PROBNP in the last 168 hours.   DDimer No results for input(s): DDIMER in the last 168 hours.   Radiology    Dg Chest Portable 1 View  Result Date: 02/23/2019 CLINICAL DATA:  Chest pain EXAM: PORTABLE CHEST 1 VIEW COMPARISON:  10/21/2017 FINDINGS: The heart size and mediastinal contours are within normal limits. Both lungs are clear. The visualized skeletal structures are unremarkable. IMPRESSION: No acute abnormality of the lungs in AP portable projection. Electronically Signed   By: Eddie Candle M.D.   On: 02/23/2019 20:45    Cardiac Studies   2d echo 10/2017 Study Conclusions  - Left ventricle: The  cavity size was normal. There was mild   concentric hypertrophy. Systolic function was normal. The   estimated ejection fraction was in the range of 55% to 60%. Wall   motion was normal; there were no regional wall motion   abnormalities. Doppler parameters are consistent with abnormal   left ventricular relaxation (grade 1 diastolic dysfunction).   Doppler parameters are consistent with elevated ventricular   end-diastolic filling pressure. - Aortic valve: There was mild stenosis. There was no   regurgitation. - Left atrium: The atrium was mildly dilated. - Right ventricle: The cavity size was normal. Wall thickness was   normal. Systolic function was normal. - Right atrium: The atrium was normal in size. - Tricuspid valve: There was no regurgitation. - Pulmonary arteries: Systolic pressure was within the normal   range. - Inferior vena cava: The vessel was normal in size. - Pericardium, extracardiac: A trivial pericardial effusion was   identified.  Patient Profile     84 y.o. male with CAD (prior Cx stenting in 2007 and 2012, DES to prox Cx 2014), chronic diastolic CHF, hyperlipidemia, HTN, obesity, OSA, DM, hiatal hernia, neuropathy, GERD, CKD stage [name], CVA in 2019 s/p L CEA 10/2017 presented with CP concerning for [place]. Last cath 2018 with recommendation for medical therapy.  Assessment & Plan    1. CAD with progressive unstable angina - minimally elevated flat hsTroponins. Plan cath as outlined today - Cr stable at 1.79 (basline appears around 2-2.2). Continue ASA, statin, Plavix, heparin. He is not on beta blocker. I do not see any recent notes discussing this but remote note from 2014 indicated he had some limitation to the amount he was previously able to tolerate with his COPD. It seems to have disappeared from his medicine list sometime after 2014. He thinks at one point this was switched to isosorbide. Home Imdur was held in lieu of NTG drip. He could not afford Ranexa. Home  amlodipine was held for unclear reasons; will resume today. Cath discussed with patient yesterday in detail, reviewed again today with patient and he is agreeable.  2. CKD stage [name] - Cr stable today. F/u post cath as well.  3. HTN - elevated. Resume home Norvasc.  4. Carotid artery disease - s/p L CEA 10/2017, overdue for vascular surgery f/u - will need to see as OP.  5. Hyperlipidemia - last LDL in 10/2017 was 150. Recheck Lipid profile in AM with updated baseline LFTs.  6. Hypomagnesemia - primary team repleted.  For questions or updates, please contact [place] Please consult [email or web address] for contact info under Cardiology/STEMI.  Signed,  Charlie Pitter, PA-C 02/25/2019, 9:48 AM    Attending Note:   The patient was seen and examined.  Agree with assessment and plan as noted above.  Changes made to the above note as needed.  Patient seen and independently examined with Melina Copa, PA .   We discussed all aspects of the encounter. I agree with the assessment and plan as stated above.  1.  CAD :   CP is better.  For cath today  2.   CKD:   Renal function is stable  3.  HTN:   Resume Norvasc.  Cont to follow      I have spent a total of 40 minutes with patient reviewing hospital  notes , telemetry, EKGs, labs and examining patient as well as establishing an assessment and plan that was discussed with the patient. > 50% of time was spent in direct patient care.    Thayer Headings, Brooke Bonito., MD, [facility] 02/25/2019, 10:31 AM [address],  [place] Pager [phone]

## 2019-02-25 NOTE — Progress Notes (Signed)
Pt returned from cath lab. Vss. No pain. Vascular site level 1. Pt ready to discharge home with son. Reviewed all d/c instructions including prescriptions and f/u appointments.

## 2019-02-26 ENCOUNTER — Telehealth: Payer: Self-pay | Admitting: Internal Medicine

## 2019-02-26 ENCOUNTER — Encounter (HOSPITAL_COMMUNITY): Payer: Self-pay | Admitting: Interventional Cardiology

## 2019-02-26 ENCOUNTER — Telehealth: Payer: Self-pay | Admitting: Interventional Cardiology

## 2019-02-26 NOTE — Telephone Encounter (Signed)
Spoke with pt and he asked about removing bandage from wrist, amount of weight he can lift and wrist movement to avoid. Answered all of pt's questions.  Pt appreciative for call.

## 2019-02-26 NOTE — Telephone Encounter (Signed)
New Message:    Pt would like for you to call him. He had a Cath yesterday and he have some questions.

## 2019-02-26 NOTE — Telephone Encounter (Signed)
Wrong provider

## 2019-02-27 ENCOUNTER — Other Ambulatory Visit: Payer: Medicare Other | Admitting: *Deleted

## 2019-02-27 ENCOUNTER — Other Ambulatory Visit: Payer: Self-pay

## 2019-02-27 DIAGNOSIS — R1013 Epigastric pain: Secondary | ICD-10-CM | POA: Diagnosis not present

## 2019-02-27 DIAGNOSIS — R194 Change in bowel habit: Secondary | ICD-10-CM | POA: Diagnosis not present

## 2019-02-27 DIAGNOSIS — N183 Chronic kidney disease, stage 3 unspecified: Secondary | ICD-10-CM

## 2019-02-27 DIAGNOSIS — K219 Gastro-esophageal reflux disease without esophagitis: Secondary | ICD-10-CM | POA: Diagnosis not present

## 2019-02-27 DIAGNOSIS — R1032 Left lower quadrant pain: Secondary | ICD-10-CM | POA: Diagnosis not present

## 2019-02-27 LAB — BASIC METABOLIC PANEL
BUN/Creatinine Ratio: 18 (ref 10–24)
BUN: 38 mg/dL — ABNORMAL HIGH (ref 8–27)
CO2: 23 mmol/L (ref 20–29)
Calcium: 9.7 mg/dL (ref 8.6–10.2)
Chloride: 105 mmol/L (ref 96–106)
Creatinine, Ser: 2.16 mg/dL — ABNORMAL HIGH (ref 0.76–1.27)
GFR calc Af Amer: 32 mL/min/{1.73_m2} — ABNORMAL LOW (ref >59)
GFR calc non Af Amer: 28 mL/min/{1.73_m2} — ABNORMAL LOW (ref >59)
Glucose: 160 mg/dL — ABNORMAL HIGH (ref 65–99)
Potassium: 4.3 mmol/L (ref 3.5–5.2)
Sodium: 136 mmol/L (ref 134–144)

## 2019-03-10 NOTE — Progress Notes (Signed)
Cardiology Office Note:    Date:  03/11/2019   ID:  Marco Acevedo, DOB 1935/03/17, MRN [id number]  PCP:  Lawerance Cruel, MD  Cardiologist:  Sinclair Grooms, MD  Electrophysiologist:  None   Referring MD: Lawerance Cruel, MD   Chief Complaint  Patient presents with  . Hospitalization Follow-up    s/p cardiac cath    History of Present Illness:    Marco Acevedo is a 84 y.o. male with:   Coronary artery disease   S/p prior stenting to LCx   Chronic diastolic CHF  Hx of L brain CVA 0/2542 (embolic from L carotid dz)  Carotid artery Dz  S/p L CEA 10/2017  Diabetes mellitus   Hyperlipidemia   Hypertension   Chronic kidney disease   COPD  Obesity   OSA  [name] was last seen by Dr. Tamala Julian on 02/20/2019.  He was having symptoms of accelerating angina and was set up for cardiac catheterization.  However, he presented to the hospital earlier with worsening chest pain.  He had minimal Troponin elevation without clear trend.  Cardiac catheterization demonstrated moderate LAD stenosis, 80% stenosis in a small caliber diagonal, diffuse moderate to severe LCx disease and moderate disease prior to a patent stent in the OM and mod disease in the RCA.  Anatomy was stable compared with 2018.  Medial therapy was recommended.  Creatinine remained stable post cath.    He returns for follow-up.  He is here alone.  He saw his gastroenterologist, Dr. Michail Sermon and was placed on Carafate.  Since then, he has had minimal chest discomfort.  He has chronic shortness of breath related to COPD.  There has been no change.  He has not had syncope, orthopnea, leg swelling.    Prior CV studies:   The following studies were reviewed today:   Cardiac catheterization 02/25/2019 LAD prox 55; D1 85 (small) LCx ost 65, prox stent patent with 50 ISR; OM1 26, stent patent with 30 ISR RCA prox 40, dist 60; RPDA 75  Patent left main  50% mid LAD with 80% stenosis in the first diagonal  (small caliber).  Ostial circumflex 60 to 70%.  50 to 60% stenosis in the proximal stent.  Segmental 70% stenosis in the first obtuse marginal proximal to the distal first obtuse marginal stent which is widely patent.  Continuation of circumflex is small ending on 2 small obtuse marginal branches.  Right coronary contains intermediate stenosis in the mid and distal segment of up to 60%.  No focal high-grade obstruction.  Normal left ventricular end-diastolic pressure.  Contrast not given to minimize load.  Findings are very similar to 2018.  The diffusely diseased area in the first obtuse marginal may be slightly worse.  Recommendations:   Continue medical therapy.  Circumflex disease is diffuse.  In absence of definite change in overall anatomy, additional exposure to contrast is felt to be higher risk (acute kidney injury) than continuing medical therapy.  If ambulates and remained stable, could discharge later today or in a.m.  Will need to have a follow-up basic metabolic panel in 48 hours.   Carotid US 10/15/17 Final Interpretation: Right Carotid: Velocities in the right ICA are consistent with a 1-39% stenosis. Left Carotid: Velocities in the left ICA are consistent with a 40-59% stenosis. Vertebrals: Bilateral vertebral arteries demonstrate antegrade flow.  Echocardiogram 10/14/17 Mild conc LVH, EF 55-60, no RWMA, Gr 1 DD, mild AS (mean 10, peak 23), mild LAE, trivial  effusion   Myoview 11/25/14  Nuclear stress EF: 73%.  There was no ST segment deviation noted during stress.  This is an intermediate risk study.  The left ventricular ejection fraction is hyperdynamic (>65%).   There was a medium-sized, mild primarily reversible basal to mid inferolateral perfusion defect and a small reversible apical septal perfusion defect with normal wall motion.  It is possible that this represents primarily ischemia, but the stress study is overall count poor compared to the rest.   Overall, probably intermediate risk due to the extent of the defects.    Past Medical History:  Diagnosis Date  . Anemia    low iron  . Arthritis   . BPH (benign prostatic hyperplasia)   . Bruises easily   . CAD (coronary artery disease)    a. per Dr. Thompson Caul note, prior stenting of Cx in 2007 and 2012. b. DES to Cx in 09/2012.  [name] Chronic diastolic CHF (congestive heart failure) ([place])   . CKD (chronic kidney disease), stage [name]   . Claustrophobia   . Complication of anesthesia    has to have head elevated to keep from strangling on post nasal drip  . COPD (chronic obstructive pulmonary disease) ([place])   . Diabetes mellitus type II    type 2  . Diarrhea 2015   had for a year and a half  . Dyspnea   . GERD (gastroesophageal reflux disease)   . Gout   . Granuloma annulare   . History of hiatal hernia   . History of kidney stones    Litthotrispy  . Hyperlipidemia   . Hypertension   . Neuropathy   . Obesity   . OSA (obstructive sleep apnea)   . Right sided weakness   . Stroke ([place]) 10/2017   Weakness right arm and leg  . Wears dentures    top  . Wears glasses    Surgical Hx: The patient  has a past surgical history that includes none; Colonoscopy; Lipoma excision; Eye surgery; Blepharoplasty; Knee arthroscopy with medial menisectomy (Right, 08/19/2013); left heart catheterization with coronary angiogram (N/A, 10/03/2011); left heart catheterization with coronary angiogram (N/A, 09/12/2012); percutaneous coronary stent intervention (pci-s) (N/A, 09/17/2012); Cardiac catheterization (2694,8546); Cholecystectomy (N/A, 09/23/2014); Trigger finger release (Left, 12/21/2015); Esophagogastroduodenoscopy (egd) with propofol (N/A, 03/22/2016); Colonoscopy with propofol (N/A, 03/22/2016); LEFT HEART CATH AND CORONARY ANGIOGRAPHY (N/A, 06/28/2016); Lithotripsy; Endarterectomy (Left, 10/19/2017); and LEFT HEART CATH AND CORONARY ANGIOGRAPHY (N/A, 02/25/2019).   Current Medications: Current Meds   Medication Sig  . albuterol (PROVENTIL HFA) 108 (90 BASE) MCG/ACT inhaler Inhale 2 puffs into the lungs every 6 (six) hours as needed for wheezing or shortness of breath.   [name] albuterol (PROVENTIL) (2.5 MG/3ML) 0.083% nebulizer solution Take 2.5 mg by nebulization every 6 (six) hours as needed for wheezing or shortness of breath.   . allopurinol (ZYLOPRIM) 100 MG tablet Take 100 mg by mouth daily.  [name] amLODipine (NORVASC) 10 MG tablet Take 1 tablet (10 mg total) by mouth daily.  [name] aspirin EC 81 MG tablet Take 1 tablet (81 mg total) by mouth daily.  [name] atorvastatin (LIPITOR) 80 MG tablet Take 1 tablet (80 mg total) by mouth daily at 6 PM.  . cholecalciferol (VITAMIN D3) 25 MCG (1000 UT) tablet Take 1,000 Units by mouth daily.  . clopidogrel (PLAVIX) 75 MG tablet Take 1 tablet (75 mg total) by mouth daily.  . famotidine (PEPCID) 20 MG tablet Take 20 mg by mouth 2 (two) times daily.  [name]  Ferrous Sulfate (IRON) 325 (65 FE) MG TABS Take 325 mg by mouth daily.   . finasteride (PROSCAR) 5 MG tablet Take 1 tablet (5 mg total) by mouth daily.  [name] gabapentin (NEURONTIN) 300 MG capsule Take 1 capsule (300 mg total) by mouth at bedtime.  [name] glipiZIDE (GLUCOTROL) 5 MG tablet Take 5 mg by mouth 3 (three) times daily before meals.   [name] guaifenesin (HUMIBID E) 400 MG TABS tablet Take 400 mg by mouth at bedtime.   . isosorbide mononitrate (IMDUR) 120 MG 24 hr tablet Take 1 tablet (120 mg total) by mouth daily.  . nitroGLYCERIN (NITROSTAT) 0.4 MG SL tablet Place 0.4 mg under the tongue every 5 (five) minutes as needed for chest pain.  . primidone (MYSOLINE) 50 MG tablet Take 50 mg by mouth at bedtime.   . sucralfate (CARAFATE) 1 GM/10ML suspension Take 1 g by mouth 4 (four) times daily.  . traMADol (ULTRAM) 50 MG tablet Take 1 tablet (50 mg total) by mouth at bedtime as needed for moderate pain. (Patient taking differently: Take 50 mg by mouth 2 (two) times daily. )  . vitamin B-12 (CYANOCOBALAMIN) 1000 MCG tablet Take  1,000 mcg by mouth daily.     Allergies:   Cimetidine   Social History   Tobacco Use  . Smoking status: Former Smoker    Packs/day: 1.00    Years: 64.00    Pack years: 64.00    Types: Cigarettes    Quit date: 05/29/2011    Years since quitting: 7.7  . Smokeless tobacco: Never Used  Substance Use Topics  . Alcohol use: Yes    Comment: rarely  . Drug use: No     Family Hx: The patient's family history includes Aneurysm in his mother; Aortic aneurysm in his father; COPD in his sister; Cancer in his brother; Other in his brother, sister, and sister.  ROS:   Please see the history of present illness.    ROS All other systems reviewed and are negative.   EKGs/Labs/Other Test Reviewed:    EKG:  EKG is not ordered today.  The ekg ordered today demonstrates n/a  Recent Labs: 02/24/2019: Magnesium 1.6 02/25/2019: Hemoglobin 13.7; Platelets 248 02/27/2019: BUN 38; Creatinine, Ser 2.16; Potassium 4.3; Sodium 136   Recent Lipid Panel Lab Results  Component Value Date/Time   CHOL 220 (H) 10/14/2017 07:42 AM   TRIG 156 (H) 10/14/2017 07:42 AM   HDL 39 (L) 10/14/2017 07:42 AM   CHOLHDL 5.6 10/14/2017 07:42 AM   LDLCALC 150 (H) 10/14/2017 07:42 AM    Physical Exam:    VS:  BP (!) 146/62   Pulse 77   Ht 5\' 8"  (1.727 m)   Wt 208 lb (94.3 kg)   SpO2 97%   BMI 31.63 kg/m     Wt Readings from Last 3 Encounters:  03/11/19 208 lb (94.3 kg)  02/24/19 200 lb (90.7 kg)  02/20/19 206 lb 12.8 oz (93.8 kg)     Physical Exam  Constitutional: He is oriented to person, place, and time. He appears well-developed and well-nourished. No distress.  HENT:  Head: Normocephalic and atraumatic.  Eyes: No scleral icterus.  Neck: No JVD present. No thyromegaly present.  Cardiovascular: Normal rate, regular rhythm and normal heart sounds.  No murmur heard. Pulmonary/Chest: Effort normal and breath sounds normal. He has no rales.  Abdominal: Soft. There is no hepatomegaly.   Musculoskeletal:        General: No edema.  Comments: Right wrist without hematoma  Lymphadenopathy:    He has no cervical adenopathy.  Neurological: He is alert and oriented to person, place, and time.  Skin: Skin is warm and dry.  Psychiatric: He has a normal mood and affect.    ASSESSMENT & PLAN:    1. Coronary artery disease involving native coronary artery of native heart with angina pectoris ([place]) History of prior PCI to the LCx.  He was recently admitted for unstable angina.  Cardiac catheterization demonstrated diffuse LCx disease, high-grade disease in a small diagonal and moderate disease in LAD and RCA.  There was no significant change since 2018 and medical therapy was recommended.  He has had significant improvement of symptoms since starting on Carafate.  Therefore, most of his symptoms may be related to gastrointestinal etiology.  His blood pressure is above target.  Therefore, I have recommended adding beta-blocker therapy for better blood pressure control as well as an antianginal.  -Continue amlodipine, aspirin, atorvastatin, clopidogrel, isosorbide  -Start metoprolol succinate 25 mg daily  -Keep follow-up with Dr. Tamala Julian in January.  2. Stage 3 chronic kidney disease, unspecified whether stage 3a or 3b CKD Follow-up creatinine post catheterization was stable.  3. Chronic diastolic CHF (congestive heart failure) ([facility]) Volume status currently stable.  He is not on diuretic therapy.  4. Essential hypertension Blood pressure above target.  Continue current dose of amlodipine, isosorbide, amlodipine.  Start metoprolol succinate 25 mg daily as outlined above.  5. Pure hypercholesterolemia Continue high-dose statin therapy.   Dispo:  Return in 6 weeks (on 04/23/2019) for Scheduled Follow Up with Dr. Tamala Julian.   Medication Adjustments/Labs and Tests Ordered: Current medicines are reviewed at length with the patient today.  Concerns regarding medicines are outlined above.   Tests Ordered: No orders of the defined types were placed in this encounter.  Medication Changes: Meds ordered this encounter  Medications  . metoprolol succinate (TOPROL XL) 25 MG 24 hr tablet    Sig: Take 1 tablet (25 mg total) by mouth daily.    Dispense:  90 tablet    Refill:  1    Signed, Richardson Dopp, PA-C  03/11/2019 11:27 AM    [facility] [place], [place], [place]  [address] Phone: [phone]; Fax: [phone]

## 2019-03-11 ENCOUNTER — Other Ambulatory Visit: Payer: Self-pay

## 2019-03-11 ENCOUNTER — Encounter: Payer: Self-pay | Admitting: Physician Assistant

## 2019-03-11 ENCOUNTER — Ambulatory Visit (INDEPENDENT_AMBULATORY_CARE_PROVIDER_SITE_OTHER): Payer: Medicare Other | Admitting: Physician Assistant

## 2019-03-11 VITALS — BP 146/62 | HR 77 | Ht 68.0 in | Wt 208.0 lb

## 2019-03-11 DIAGNOSIS — I200 Unstable angina: Secondary | ICD-10-CM

## 2019-03-11 DIAGNOSIS — I25119 Atherosclerotic heart disease of native coronary artery with unspecified angina pectoris: Secondary | ICD-10-CM | POA: Diagnosis not present

## 2019-03-11 DIAGNOSIS — N183 Chronic kidney disease, stage 3 unspecified: Secondary | ICD-10-CM | POA: Diagnosis not present

## 2019-03-11 DIAGNOSIS — I5032 Chronic diastolic (congestive) heart failure: Secondary | ICD-10-CM | POA: Diagnosis not present

## 2019-03-11 DIAGNOSIS — E7800 Pure hypercholesterolemia, unspecified: Secondary | ICD-10-CM | POA: Diagnosis not present

## 2019-03-11 DIAGNOSIS — I10 Essential (primary) hypertension: Secondary | ICD-10-CM

## 2019-03-11 MED ORDER — METOPROLOL SUCCINATE ER 25 MG PO TB24: 25.0000 mg | ORAL_TABLET | Freq: Every day | ORAL | 1 refills | Status: DC

## 2019-03-11 NOTE — Patient Instructions (Addendum)
Medication Instructions:   Your physician has recommended you make the following change in your medication:   1) Start Toprol XL 25MG , 1 tablet by mouth once a day  *If you need a refill on your cardiac medications before your next appointment, please call your pharmacy*  Lab Work:  None ordered today   Testing/Procedures:  None ordered today  Follow-Up:  At [facility], you and your health needs are our priority.  As part of our continuing mission to provide you with exceptional heart care, we have created designated Provider Care Teams.  These Care Teams include your primary Cardiologist (physician) and Advanced Practice Providers (APPs -  Physician Assistants and Nurse Practitioners) who all work together to provide you with the care you need, when you need it.  Keep your follow up with Daneen Schick, MD on 04/23/2019 at  10:40AM

## 2019-03-24 ENCOUNTER — Telehealth: Payer: Self-pay

## 2019-03-24 MED ORDER — METOPROLOL SUCCINATE ER 25 MG PO TB24: 25.0000 mg | ORAL_TABLET | Freq: Every day | ORAL | 1 refills | Status: DC

## 2019-03-24 NOTE — Telephone Encounter (Signed)
Refill sent.

## 2019-03-27 ENCOUNTER — Telehealth: Payer: Self-pay | Admitting: Interventional Cardiology

## 2019-03-27 NOTE — Telephone Encounter (Signed)
BP looks good. Continue current medications/treatment plan and follow up as scheduled.  Richardson Dopp, PA-C    03/27/2019 4:34 PM

## 2019-03-27 NOTE — Telephone Encounter (Signed)
New Message    Pt c/o medication issue:  1. Name of Medication: metoprolol succinate (TOPROL XL) 25 MG 24 hr tablet  2. How are you currently taking this medication (dosage and times per day)? 25 MG 1 X DAILY   3. Are you having a reaction (difficulty breathing--STAT)? No   4. What is your medication issue? Pt is calling to leave his BP readings after being on this medication for a week. He feels his BP is low and is wondering if the medication needs to be adjusted  BP 124/61 HR 54  BP 118/62 HR 53 BP 146/66 HR 55 BP 126/65 HR 54  BP 111/59 HR 49    Please call

## 2019-03-27 NOTE — Telephone Encounter (Signed)
Spoke with pt and made him aware of recommendations.  Pt verbalized understanding and was in agreement with this plan.

## 2019-03-27 NOTE — Telephone Encounter (Signed)
Pt started on Toprol XL at [place] [facility] on 03/11/2019.  States he was told to monitor vitals and call with those numbers.  Pt takes Toprol in the morning.  First two BP/HR readings are at night, last three readings are in the morning BEFORE medications.  Pt denies lightheadedness, dizziness, or any other symptoms.  States he feels fine.  Advised I will send readings to Richardson Dopp, PA-C and call back with recommendations.  Pt appreciative for call.

## 2019-03-31 ENCOUNTER — Other Ambulatory Visit: Payer: Self-pay

## 2019-03-31 MED ORDER — METOPROLOL SUCCINATE ER 25 MG PO TB24: 25.0000 mg | ORAL_TABLET | Freq: Every day | ORAL | 1 refills | Status: DC

## 2019-03-31 NOTE — Progress Notes (Signed)
Rx resent to pharm

## 2019-04-09 ENCOUNTER — Ambulatory Visit: Payer: Medicare Other | Attending: Internal Medicine

## 2019-04-09 DIAGNOSIS — U071 COVID-19: Secondary | ICD-10-CM

## 2019-04-10 LAB — NOVEL CORONAVIRUS, NAA: SARS-CoV-2, NAA: NOT DETECTED

## 2019-04-22 NOTE — Progress Notes (Addendum)
Cardiology Office Note:    Date:  04/23/2019   ID:  Marco Acevedo, DOB [birth year], MRN [id number]  PCP:  Lawerance Cruel, MD  Cardiologist:  Sinclair Grooms, MD   Referring MD: Lawerance Cruel, MD   Chief Complaint  Patient presents with  . Coronary Artery Disease    History of Present Illness:    Marco Acevedo is a 85 y.o. male with a hx of CAD and prior circumflex stents, Chronic DHF, hyperlipidemia, hypertension, obesity and OSA. History of CVA October 14, 2017 related to, left brain atheroembolic event from the left carotid. Underwent left carotid endarterectomy on 10/19/2017.  Cardiac cath November 2020 for progressive chest pain with stable anatomy.  Carafate resolved the chest discomfort as it did in 2019.  He is doing well.  He feels that at the end of cardiac evaluation including heart cath, Carafate is the therapy that made a difference and helped resolve the chest pain.  This is the second time that this is occurred.  Past Medical History:  Diagnosis Date  . Anemia    low iron  . Arthritis   . BPH (benign prostatic hyperplasia)   . Bruises easily   . CAD (coronary artery disease)    a. per Dr. Thompson Caul note, prior stenting of Cx in 2007 and 2012. b. DES to Cx in 09/2012.  [name] Chronic diastolic CHF (congestive heart failure) ([place])   . CKD (chronic kidney disease), stage [name]   . Claustrophobia   . Complication of anesthesia    has to have head elevated to keep from strangling on post nasal drip  . COPD (chronic obstructive pulmonary disease) ([place])   . Diabetes mellitus type II    type 2  . Diarrhea 2015   had for a year and a half  . Dyspnea   . GERD (gastroesophageal reflux disease)   . Gout   . Granuloma annulare   . History of hiatal hernia   . History of kidney stones    Litthotrispy  . Hyperlipidemia   . Hypertension   . Neuropathy   . Obesity   . OSA (obstructive sleep apnea)   . Right sided weakness   . Stroke ([place]) 10/2017   Weakness right arm  and leg  . Wears dentures    top  . Wears glasses     Past Surgical History:  Procedure Laterality Date  . BLEPHAROPLASTY    . CARDIAC CATHETERIZATION  [phone]   X 2 stents  . CHOLECYSTECTOMY N/A 09/23/2014   Procedure: LAPAROSCOPIC CHOLECYSTECTOMY WITH INTRAOPERATIVE CHOLANGIOGRAM;  Surgeon: Autumn Messing [name], MD;  Location: [facility];  Service: General;  Laterality: N/A;  . COLONOSCOPY    . COLONOSCOPY WITH PROPOFOL N/A 03/22/2016   Procedure: COLONOSCOPY WITH PROPOFOL;  Surgeon: Wilford Corner, MD;  Location[facility] ENDOSCOPY;  Service: Endoscopy;  Laterality: N/A;  . ENDARTERECTOMY Left 10/19/2017   Procedure: ENDARTERECTOMY CAROTID LEFT;  Surgeon: [name], MD;  Location: [facility] OR;  Service: Vascular;  Laterality: Left;  . ESOPHAGOGASTRODUODENOSCOPY (EGD) WITH PROPOFOL N/A 03/22/2016   Procedure: ESOPHAGOGASTRODUODENOSCOPY (EGD) WITH PROPOFOL;  Surgeon: Wilford Corner, MD;  Location: [facility] ENDOSCOPY;  Service: Endoscopy;  Laterality: N/A;  . EYE SURGERY     both cataracts  . KNEE ARTHROSCOPY WITH MEDIAL MENISECTOMY Right 08/19/2013   Procedure: RIGHT KNEE ARTHROSCOPY WITH PARTIAL MEDIAL MENISECTOMY AND CHONDROPLASTY;  Surgeon: Hessie Dibble, MD;  Location: [place];  Service: Orthopedics;  Laterality: Right;  .  LEFT HEART CATH AND CORONARY ANGIOGRAPHY N/A 06/28/2016   Procedure: Left Heart Cath and Coronary Angiography;  Surgeon: Belva Crome, MD;  Location: [place] CV LAB;  Service: Cardiovascular;  Laterality: N/A;  . LEFT HEART CATH AND CORONARY ANGIOGRAPHY N/A 02/25/2019   Procedure: LEFT HEART CATH AND CORONARY ANGIOGRAPHY;  Surgeon: Belva Crome, MD;  Location: [place] CV LAB;  Service: Cardiovascular;  Laterality: N/A;  . LEFT HEART CATHETERIZATION WITH CORONARY ANGIOGRAM N/A 10/03/2011   Procedure: LEFT HEART CATHETERIZATION WITH CORONARY ANGIOGRAM;  Surgeon: Sinclair Grooms, MD;  Location: [facility] CATH LAB;  Service: Cardiovascular;  Laterality: N/A;  .  LEFT HEART CATHETERIZATION WITH CORONARY ANGIOGRAM N/A 09/12/2012   Procedure: LEFT HEART CATHETERIZATION WITH CORONARY ANGIOGRAM;  Surgeon: Sinclair Grooms, MD;  Location: [facility] CATH LAB;  Service: Cardiovascular;  Laterality: N/A;  . LIPOMA EXCISION     Biospy only; left parotid gland  . LITHOTRIPSY    . none    . PERCUTANEOUS CORONARY STENT INTERVENTION (PCI-S) N/A 09/17/2012   Procedure: PERCUTANEOUS CORONARY STENT INTERVENTION (PCI-S);  Surgeon: Sinclair Grooms, MD;  Location: [facility] CATH LAB;  Service: Cardiovascular;  Laterality: N/A;  . TRIGGER FINGER RELEASE Left 12/21/2015   Procedure: LEFT LONG FINGER TRIGGER RELEASE ;  Surgeon: Milly Jakob, MD;  Location: [place];  Service: Orthopedics;  Laterality: Left;    Current Medications: Current Meds  Medication Sig  . albuterol (PROVENTIL HFA) 108 (90 BASE) MCG/ACT inhaler Inhale 2 puffs into the lungs every 6 (six) hours as needed for wheezing or shortness of breath.   [name] albuterol (PROVENTIL) (2.5 MG/3ML) 0.083% nebulizer solution Take 2.5 mg by nebulization every 6 (six) hours as needed for wheezing or shortness of breath.   . allopurinol (ZYLOPRIM) 100 MG tablet Take 100 mg by mouth daily.  [name] amLODipine (NORVASC) 10 MG tablet Take 1 tablet (10 mg total) by mouth daily.  [name] aspirin EC 81 MG tablet Take 1 tablet (81 mg total) by mouth daily.  [name] atorvastatin (LIPITOR) 80 MG tablet Take 1 tablet (80 mg total) by mouth daily at 6 PM.  . cholecalciferol (VITAMIN D3) 25 MCG (1000 UT) tablet Take 1,000 Units by mouth daily.  . clopidogrel (PLAVIX) 75 MG tablet Take 1 tablet (75 mg total) by mouth daily.  . famotidine (PEPCID) 20 MG tablet Take 20 mg by mouth 2 (two) times daily.  . Ferrous Sulfate (IRON) 325 (65 FE) MG TABS Take 325 mg by mouth daily.   . finasteride (PROSCAR) 5 MG tablet Take 1 tablet (5 mg total) by mouth daily.  [name] gabapentin (NEURONTIN) 300 MG capsule Take 1 capsule (300 mg total) by mouth at bedtime.  [name]  glipiZIDE (GLUCOTROL) 5 MG tablet Take 5 mg by mouth 3 (three) times daily before meals.   [name] guaifenesin (HUMIBID E) 400 MG TABS tablet Take 400 mg by mouth at bedtime.   . isosorbide mononitrate (IMDUR) 120 MG 24 hr tablet Take 1 tablet (120 mg total) by mouth daily.  . metoprolol succinate (TOPROL XL) 25 MG 24 hr tablet Take 1 tablet (25 mg total) by mouth daily.  . nitroGLYCERIN (NITROSTAT) 0.4 MG SL tablet Place 0.4 mg under the tongue every 5 (five) minutes as needed for chest pain.  . primidone (MYSOLINE) 50 MG tablet Take 50 mg by mouth at bedtime.   . sucralfate (CARAFATE) 1 GM/10ML suspension Take 1 g by mouth 4 (four) times daily.  . traMADol Veatrice Bourbon)  50 MG tablet Take 1 tablet (50 mg total) by mouth at bedtime as needed for moderate pain.  . vitamin B-12 (CYANOCOBALAMIN) 1000 MCG tablet Take 1,000 mcg by mouth daily.     Allergies:   Cimetidine   Social History   Socioeconomic History  . Marital status: Married    Spouse name: Not on file  . Number of children: Not on file  . Years of education: Not on file  . Highest education level: Not on file  Occupational History  . Occupation: retired  Tobacco Use  . Smoking status: Former Smoker    Packs/day: 1.00    Years: 64.00    Pack years: 64.00    Types: Cigarettes    Quit date: 05/29/2011    Years since quitting: 7.9  . Smokeless tobacco: Never Used  Substance and Sexual Activity  . Alcohol use: Yes    Comment: rarely  . Drug use: No  . Sexual activity: Not Currently  Other Topics Concern  . Not on file  Social History Narrative   Denies Caffeine use    Social Determinants of Health   Financial Resource Strain:   . Difficulty of Paying Living Expenses: Not on file  Food Insecurity:   . Worried About Charity fundraiser in the Last Year: Not on file  . Ran Out of Food in the Last Year: Not on file  Transportation Needs:   . Lack of Transportation (Medical): Not on file  . Lack of Transportation (Non-Medical):  Not on file  Physical Activity:   . Days of Exercise per Week: Not on file  . Minutes of Exercise per Session: Not on file  Stress:   . Feeling of Stress : Not on file  Social Connections:   . Frequency of Communication with Friends and Family: Not on file  . Frequency of Social Gatherings with Friends and Family: Not on file  . Attends Religious Services: Not on file  . Active Member of Clubs or Organizations: Not on file  . Attends Archivist Meetings: Not on file  . Marital Status: Not on file     Family History: The patient's family history includes Aneurysm in his mother; Aortic aneurysm in his father; COPD in his sister; Cancer in his brother; Other in his brother, sister, and sister.  ROS:   Please see the history of present illness.    He feels congested and has increased cough.  Diastolic blood pressures have been running between 55 and 65.  He states his [place] is concerned about this.  All other systems reviewed and are negative.  EKGs/Labs/Other Studies Reviewed:    The following studies were reviewed today: 2D Doppler echocardiogram October 14, 2017: Study Conclusions  - Left ventricle: The cavity size was normal. There was mild   concentric hypertrophy. Systolic function was normal. The   estimated ejection fraction was in the range of 55% to 60%. Wall   motion was normal; there were no regional wall motion   abnormalities. Doppler parameters are consistent with abnormal   left ventricular relaxation (grade 1 diastolic dysfunction).   Doppler parameters are consistent with elevated ventricular   end-diastolic filling pressure. - Aortic valve: There was mild stenosis. There was no   regurgitation. - Left atrium: The atrium was mildly dilated. - Right ventricle: The cavity size was normal. Wall thickness was   normal. Systolic function was normal. - Right atrium: The atrium was normal in size. - Tricuspid valve: There  was no regurgitation. -  Pulmonary arteries: Systolic pressure was within the normal   range. - Inferior vena cava: The vessel was normal in size. - Pericardium, extracardiac: A trivial pericardial effusion was   identified.  Impressions:  - No cardiac source of emboli was indentified.    Coronary Angiography 02/25/2019: Diagnostic Dominance: Right   Patent left main  50% mid LAD with 80% stenosis in the first diagonal (small caliber).  Ostial circumflex 60 to 70%.  50 to 60% stenosis in the proximal stent.  Segmental 70% stenosis in the first obtuse marginal proximal to the distal first obtuse marginal stent which is widely patent.  Continuation of circumflex is small ending on 2 small obtuse marginal branches.  Right coronary contains intermediate stenosis in the mid and distal segment of up to 60%.  No focal high-grade obstruction.  Normal left ventricular end-diastolic pressure.  Contrast not given to minimize load.  Findings are very similar to 2018.  The diffusely diseased area in the first obtuse marginal may be slightly worse.  Recommendations:   Continue medical therapy.  Circumflex disease is diffuse.  In absence of definite change in overall anatomy, additional exposure to contrast is felt to be higher risk (acute kidney injury) than continuing medical therapy.  If ambulates and remained stable, could discharge later today or in a.m.  Will need to have a follow-up basic metabolic panel in 48 hours.     EKG:  EKG a new tracing is not performed  Recent Labs: 02/24/2019: Magnesium 1.6 02/25/2019: Hemoglobin 13.7; Platelets 248 02/27/2019: BUN 38; Creatinine, Ser 2.16; Potassium 4.3; Sodium 136  Recent Lipid Panel    Component Value Date/Time   CHOL 220 (H) 10/14/2017 0742   TRIG 156 (H) 10/14/2017 0742   HDL 39 (L) 10/14/2017 0742   CHOLHDL 5.6 10/14/2017 0742   VLDL 31 10/14/2017 0742   LDLCALC 150 (H) 10/14/2017 0742    Physical Exam:    VS:  BP 132/82   Pulse 65    Ht 5\' 8"  (1.727 m)   Wt 223 lb 12.8 oz (101.5 kg)   SpO2 97%   BMI 34.03 kg/m     Wt Readings from Last 3 Encounters:  04/23/19 223 lb 12.8 oz (101.5 kg)  03/11/19 208 lb (94.3 kg)  02/24/19 200 lb (90.7 kg)     GEN: Obese, pink, and compatible with age.. No acute distress HEENT: Normal NECK: No JVD. LYMPHATICS: No lymphadenopathy CARDIAC:  RRR without murmur, gallop, or edema. VASCULAR:  Normal Pulses. No bruits. RESPIRATORY:  Clear to auscultation without rales, wheezing or rhonchi  ABDOMEN: Soft, non-tender, non-distended, No pulsatile mass, MUSCULOSKELETAL: No deformity  SKIN: Warm and dry NEUROLOGIC:  Alert and oriented x 3 PSYCHIATRIC:  Normal affect   ASSESSMENT:    1. Coronary artery disease involving native coronary artery of native heart with angina pectoris (HCC)   2. Stage 3 chronic kidney disease, unspecified whether stage 3a or 3b CKD   3. Chronic diastolic CHF (congestive heart failure) ([place])   4. Essential hypertension   5. OSA (obstructive sleep apnea)   6. Educated about COVID-19 virus infection    PLAN:    In order of problems listed above:  1. Secondary prevention discussed.  I will again reiterate that progressive chest pain on the last occasion led to repeat cath which revealed stable anatomy compared to 2 years ago.  The end result is back Carafate led to resolution of discomfort as it did 2 years ago.  2. Kidney function is stable. 3. No clinical evidence of volume overload.  Diastolic dysfunction noted on 2019 echocardiogram. 4. Blood pressure is stable. 5. Continue CPAP use. 6. 3W's is encouraged despite getting the COVID-19 vaccine.   Medication Adjustments/Labs and Tests Ordered: Current medicines are reviewed at length with the patient today.  Concerns regarding medicines are outlined above.  No orders of the defined types were placed in this encounter.  No orders of the defined types were placed in this encounter.   There are no  Patient Instructions on file for this visit.   Signed, Sinclair Grooms, MD  04/23/2019 11:06 AM    [place]

## 2019-04-23 ENCOUNTER — Other Ambulatory Visit: Payer: Self-pay

## 2019-04-23 ENCOUNTER — Encounter: Payer: Self-pay | Admitting: Interventional Cardiology

## 2019-04-23 ENCOUNTER — Ambulatory Visit (INDEPENDENT_AMBULATORY_CARE_PROVIDER_SITE_OTHER): Payer: Medicare Other | Admitting: Interventional Cardiology

## 2019-04-23 ENCOUNTER — Telehealth: Payer: Self-pay | Admitting: Interventional Cardiology

## 2019-04-23 VITALS — BP 132/82 | HR 65 | Ht 68.0 in | Wt 223.8 lb

## 2019-04-23 DIAGNOSIS — I25119 Atherosclerotic heart disease of native coronary artery with unspecified angina pectoris: Secondary | ICD-10-CM

## 2019-04-23 DIAGNOSIS — I10 Essential (primary) hypertension: Secondary | ICD-10-CM

## 2019-04-23 DIAGNOSIS — N183 Chronic kidney disease, stage 3 unspecified: Secondary | ICD-10-CM

## 2019-04-23 DIAGNOSIS — Z7189 Other specified counseling: Secondary | ICD-10-CM

## 2019-04-23 DIAGNOSIS — G4733 Obstructive sleep apnea (adult) (pediatric): Secondary | ICD-10-CM | POA: Diagnosis not present

## 2019-04-23 DIAGNOSIS — I5032 Chronic diastolic (congestive) heart failure: Secondary | ICD-10-CM | POA: Diagnosis not present

## 2019-04-23 NOTE — Patient Instructions (Signed)

## 2019-04-23 NOTE — Telephone Encounter (Signed)
Spoke with pt and answered all of his questions.  Explained to him about HF and diastolic dysfunction.  Pt verbalized understanding and was appreciative for call.

## 2019-04-23 NOTE — Telephone Encounter (Signed)
Patient calling stating he is confused about the congestive heart failure on his office summary today 1/13. He says he does not remember that being discussed in his appointment and would like a call back.

## 2019-04-30 ENCOUNTER — Encounter: Payer: Self-pay | Admitting: Adult Health

## 2019-05-07 DIAGNOSIS — R609 Edema, unspecified: Secondary | ICD-10-CM | POA: Diagnosis not present

## 2019-05-07 DIAGNOSIS — I10 Essential (primary) hypertension: Secondary | ICD-10-CM | POA: Diagnosis not present

## 2019-05-07 DIAGNOSIS — K219 Gastro-esophageal reflux disease without esophagitis: Secondary | ICD-10-CM | POA: Diagnosis not present

## 2019-06-04 DIAGNOSIS — K219 Gastro-esophageal reflux disease without esophagitis: Secondary | ICD-10-CM | POA: Diagnosis not present

## 2019-06-04 DIAGNOSIS — R609 Edema, unspecified: Secondary | ICD-10-CM | POA: Diagnosis not present

## 2019-06-04 DIAGNOSIS — G629 Polyneuropathy, unspecified: Secondary | ICD-10-CM | POA: Diagnosis not present

## 2019-06-04 DIAGNOSIS — E1122 Type 2 diabetes mellitus with diabetic chronic kidney disease: Secondary | ICD-10-CM | POA: Diagnosis not present

## 2019-06-04 DIAGNOSIS — I10 Essential (primary) hypertension: Secondary | ICD-10-CM | POA: Diagnosis not present

## 2019-06-04 DIAGNOSIS — E875 Hyperkalemia: Secondary | ICD-10-CM | POA: Diagnosis not present

## 2019-06-04 DIAGNOSIS — I5032 Chronic diastolic (congestive) heart failure: Secondary | ICD-10-CM | POA: Diagnosis not present

## 2019-06-04 DIAGNOSIS — E1140 Type 2 diabetes mellitus with diabetic neuropathy, unspecified: Secondary | ICD-10-CM | POA: Diagnosis not present

## 2019-06-04 DIAGNOSIS — J449 Chronic obstructive pulmonary disease, unspecified: Secondary | ICD-10-CM | POA: Diagnosis not present

## 2019-06-04 DIAGNOSIS — I69359 Hemiplegia and hemiparesis following cerebral infarction affecting unspecified side: Secondary | ICD-10-CM | POA: Diagnosis not present

## 2019-06-04 DIAGNOSIS — I129 Hypertensive chronic kidney disease with stage 1 through stage 4 chronic kidney disease, or unspecified chronic kidney disease: Secondary | ICD-10-CM | POA: Diagnosis not present

## 2019-06-04 DIAGNOSIS — N183 Chronic kidney disease, stage 3 unspecified: Secondary | ICD-10-CM | POA: Diagnosis not present

## 2019-06-06 DIAGNOSIS — M65331 Trigger finger, right middle finger: Secondary | ICD-10-CM | POA: Diagnosis not present

## 2019-07-03 DIAGNOSIS — N400 Enlarged prostate without lower urinary tract symptoms: Secondary | ICD-10-CM | POA: Diagnosis not present

## 2019-07-03 DIAGNOSIS — I10 Essential (primary) hypertension: Secondary | ICD-10-CM | POA: Diagnosis not present

## 2019-07-03 DIAGNOSIS — J449 Chronic obstructive pulmonary disease, unspecified: Secondary | ICD-10-CM | POA: Diagnosis not present

## 2019-07-03 DIAGNOSIS — E7800 Pure hypercholesterolemia, unspecified: Secondary | ICD-10-CM | POA: Diagnosis not present

## 2019-07-03 DIAGNOSIS — N183 Chronic kidney disease, stage 3 unspecified: Secondary | ICD-10-CM | POA: Diagnosis not present

## 2019-07-03 DIAGNOSIS — E1140 Type 2 diabetes mellitus with diabetic neuropathy, unspecified: Secondary | ICD-10-CM | POA: Diagnosis not present

## 2019-07-03 DIAGNOSIS — I2510 Atherosclerotic heart disease of native coronary artery without angina pectoris: Secondary | ICD-10-CM | POA: Diagnosis not present

## 2019-07-08 DIAGNOSIS — N183 Chronic kidney disease, stage 3 unspecified: Secondary | ICD-10-CM | POA: Diagnosis not present

## 2019-07-10 DIAGNOSIS — N390 Urinary tract infection, site not specified: Secondary | ICD-10-CM | POA: Diagnosis not present

## 2019-07-10 DIAGNOSIS — N183 Chronic kidney disease, stage 3 unspecified: Secondary | ICD-10-CM | POA: Diagnosis not present

## 2019-07-10 DIAGNOSIS — I10 Essential (primary) hypertension: Secondary | ICD-10-CM | POA: Diagnosis not present

## 2019-07-10 DIAGNOSIS — N179 Acute kidney failure, unspecified: Secondary | ICD-10-CM | POA: Diagnosis not present

## 2019-07-11 ENCOUNTER — Other Ambulatory Visit: Payer: Self-pay | Admitting: Internal Medicine

## 2019-07-11 DIAGNOSIS — N179 Acute kidney failure, unspecified: Secondary | ICD-10-CM

## 2019-07-18 ENCOUNTER — Ambulatory Visit
Admission: RE | Admit: 2019-07-18 | Discharge: 2019-07-18 | Disposition: A | Discharge status: Home / Self Care | Payer: Medicare Other | Source: Ambulatory Visit | Attending: Internal Medicine | Admitting: Internal Medicine

## 2019-07-18 DIAGNOSIS — N281 Cyst of kidney, acquired: Secondary | ICD-10-CM | POA: Diagnosis not present

## 2019-07-18 DIAGNOSIS — N179 Acute kidney failure, unspecified: Secondary | ICD-10-CM

## 2019-07-24 DIAGNOSIS — E7800 Pure hypercholesterolemia, unspecified: Secondary | ICD-10-CM | POA: Diagnosis not present

## 2019-07-24 DIAGNOSIS — N183 Chronic kidney disease, stage 3 unspecified: Secondary | ICD-10-CM | POA: Diagnosis not present

## 2019-07-24 DIAGNOSIS — N400 Enlarged prostate without lower urinary tract symptoms: Secondary | ICD-10-CM | POA: Diagnosis not present

## 2019-07-24 DIAGNOSIS — E1140 Type 2 diabetes mellitus with diabetic neuropathy, unspecified: Secondary | ICD-10-CM | POA: Diagnosis not present

## 2019-07-24 DIAGNOSIS — I2510 Atherosclerotic heart disease of native coronary artery without angina pectoris: Secondary | ICD-10-CM | POA: Diagnosis not present

## 2019-07-24 DIAGNOSIS — J449 Chronic obstructive pulmonary disease, unspecified: Secondary | ICD-10-CM | POA: Diagnosis not present

## 2019-07-24 DIAGNOSIS — I10 Essential (primary) hypertension: Secondary | ICD-10-CM | POA: Diagnosis not present

## 2019-07-29 DIAGNOSIS — M25521 Pain in right elbow: Secondary | ICD-10-CM | POA: Diagnosis not present

## 2019-08-20 DIAGNOSIS — E875 Hyperkalemia: Secondary | ICD-10-CM | POA: Diagnosis not present

## 2019-08-20 DIAGNOSIS — K219 Gastro-esophageal reflux disease without esophagitis: Secondary | ICD-10-CM | POA: Diagnosis not present

## 2019-08-20 DIAGNOSIS — E1122 Type 2 diabetes mellitus with diabetic chronic kidney disease: Secondary | ICD-10-CM | POA: Diagnosis not present

## 2019-08-20 DIAGNOSIS — I5032 Chronic diastolic (congestive) heart failure: Secondary | ICD-10-CM | POA: Diagnosis not present

## 2019-08-20 DIAGNOSIS — N184 Chronic kidney disease, stage 4 (severe): Secondary | ICD-10-CM | POA: Diagnosis not present

## 2019-08-20 DIAGNOSIS — I129 Hypertensive chronic kidney disease with stage 1 through stage 4 chronic kidney disease, or unspecified chronic kidney disease: Secondary | ICD-10-CM | POA: Diagnosis not present

## 2019-08-20 DIAGNOSIS — G629 Polyneuropathy, unspecified: Secondary | ICD-10-CM | POA: Diagnosis not present

## 2019-09-23 DIAGNOSIS — I2510 Atherosclerotic heart disease of native coronary artery without angina pectoris: Secondary | ICD-10-CM | POA: Diagnosis not present

## 2019-09-23 DIAGNOSIS — N183 Chronic kidney disease, stage 3 unspecified: Secondary | ICD-10-CM | POA: Diagnosis not present

## 2019-09-23 DIAGNOSIS — E7800 Pure hypercholesterolemia, unspecified: Secondary | ICD-10-CM | POA: Diagnosis not present

## 2019-09-23 DIAGNOSIS — E1140 Type 2 diabetes mellitus with diabetic neuropathy, unspecified: Secondary | ICD-10-CM | POA: Diagnosis not present

## 2019-09-23 DIAGNOSIS — N400 Enlarged prostate without lower urinary tract symptoms: Secondary | ICD-10-CM | POA: Diagnosis not present

## 2019-09-23 DIAGNOSIS — J449 Chronic obstructive pulmonary disease, unspecified: Secondary | ICD-10-CM | POA: Diagnosis not present

## 2019-09-23 DIAGNOSIS — I10 Essential (primary) hypertension: Secondary | ICD-10-CM | POA: Diagnosis not present

## 2019-10-27 DIAGNOSIS — N400 Enlarged prostate without lower urinary tract symptoms: Secondary | ICD-10-CM | POA: Diagnosis not present

## 2019-10-27 DIAGNOSIS — J449 Chronic obstructive pulmonary disease, unspecified: Secondary | ICD-10-CM | POA: Diagnosis not present

## 2019-10-27 DIAGNOSIS — I2510 Atherosclerotic heart disease of native coronary artery without angina pectoris: Secondary | ICD-10-CM | POA: Diagnosis not present

## 2019-10-27 DIAGNOSIS — I10 Essential (primary) hypertension: Secondary | ICD-10-CM | POA: Diagnosis not present

## 2019-10-27 DIAGNOSIS — E7800 Pure hypercholesterolemia, unspecified: Secondary | ICD-10-CM | POA: Diagnosis not present

## 2019-10-27 DIAGNOSIS — E1140 Type 2 diabetes mellitus with diabetic neuropathy, unspecified: Secondary | ICD-10-CM | POA: Diagnosis not present

## 2019-10-27 DIAGNOSIS — N183 Chronic kidney disease, stage 3 unspecified: Secondary | ICD-10-CM | POA: Diagnosis not present

## 2019-11-03 NOTE — Progress Notes (Signed)
Cardiology Office Note:    Date:  11/05/2019   ID:  Marco Acevedo, DOB Mar 30, 1935, MRN [id number]  PCP:  Lawerance Cruel, MD  Cardiologist:  Sinclair Grooms, MD   Referring MD: Lawerance Cruel, MD   Chief Complaint  Patient presents with  . Coronary Artery Disease  . Hyperlipidemia  . Hypertension    History of Present Illness:    Marco Acevedo is a 85 y.o. male with a hx of  CAD and prior circumflex stents, Chronic DHF, hyperlipidemia, hypertension, obesity and OSA. History of CVA October 14, 2017 related to, left brain atheroembolic event from the left carotid. Underwent left carotid endarterectomy on 10/19/2017.  Cardiac cath November 2020 for progressive chest pain with stable anatomy.  Carafate resolved the chest discomfort as it did in 2019.  He has dyspnea on exertion.  He has not had syncope or episodes of angina.  He is troubled by cramping in his legs at night.  He does not have orthopnea.  He does not have peripheral edema.  He has not taken nitroglycerin in quite some time.  He was recently seen at the [place] where his potassium was 5.1.  He continues to play golf.  Other than dyspnea, he has no limitations.  Past Medical History:  Diagnosis Date  . Anemia    low iron  . Arthritis   . BPH (benign prostatic hyperplasia)   . Bruises easily   . CAD (coronary artery disease)    a. per Dr. Thompson Caul note, prior stenting of Cx in 2007 and 2012. b. DES to Cx in 09/2012.  [name] Chronic diastolic CHF (congestive heart failure) ([place])   . CKD (chronic kidney disease), stage [name]   . Claustrophobia   . Complication of anesthesia    has to have head elevated to keep from strangling on post nasal drip  . COPD (chronic obstructive pulmonary disease) ([place])   . Diabetes mellitus type II    type 2  . Diarrhea 2015   had for a year and a half  . Dyspnea   . GERD (gastroesophageal reflux disease)   . Gout   . Granuloma annulare   . History of hiatal hernia   . History of kidney  stones    [place]  . Hyperlipidemia   . Hypertension   . Neuropathy   . Obesity   . OSA (obstructive sleep apnea)   . Right sided weakness   . Stroke ([place]) 10/2017   Weakness right arm and leg  . Wears dentures    top  . Wears glasses     Past Surgical History:  Procedure Laterality Date  . BLEPHAROPLASTY    . CARDIAC CATHETERIZATION  [phone]   X 2 stents  . CHOLECYSTECTOMY N/A 09/23/2014   Procedure: LAPAROSCOPIC CHOLECYSTECTOMY WITH INTRAOPERATIVE CHOLANGIOGRAM;  Surgeon: Autumn Messing [name], MD;  Location: [place];  Service: General;  Laterality: N/A;  . COLONOSCOPY    . COLONOSCOPY WITH PROPOFOL N/A 03/22/2016   Procedure: COLONOSCOPY WITH PROPOFOL;  Surgeon: Wilford Corner, MD;  Location: [facility] ENDOSCOPY;  Service: Endoscopy;  Laterality: N/A;  . ENDARTERECTOMY Left 10/19/2017   Procedure: ENDARTERECTOMY CAROTID LEFT;  Surgeon: [name], MD;  Location[facility] OR;  Service: Vascular;  Laterality: Left;  . ESOPHAGOGASTRODUODENOSCOPY (EGD) WITH PROPOFOL N/A 03/22/2016   Procedure: ESOPHAGOGASTRODUODENOSCOPY (EGD) WITH PROPOFOL;  Surgeon: Wilford Corner, MD;  Location: [facility] ENDOSCOPY;  Service: Endoscopy;  Laterality: N/A;  . EYE SURGERY  both cataracts  . KNEE ARTHROSCOPY WITH MEDIAL MENISECTOMY Right 08/19/2013   Procedure: RIGHT KNEE ARTHROSCOPY WITH PARTIAL MEDIAL MENISECTOMY AND CHONDROPLASTY;  Surgeon: Hessie Dibble, MD;  Location: [place];  Service: Orthopedics;  Laterality: Right;  . LEFT HEART CATH AND CORONARY ANGIOGRAPHY N/A 06/28/2016   Procedure: Left Heart Cath and Coronary Angiography;  Surgeon: Belva Crome, MD;  Location: [place] CV LAB;  Service: Cardiovascular;  Laterality: N/A;  . LEFT HEART CATH AND CORONARY ANGIOGRAPHY N/A 02/25/2019   Procedure: LEFT HEART CATH AND CORONARY ANGIOGRAPHY;  Surgeon: Belva Crome, MD;  Location: [place] CV LAB;  Service: Cardiovascular;  Laterality: N/A;  . LEFT HEART CATHETERIZATION WITH CORONARY  ANGIOGRAM N/A 10/03/2011   Procedure: LEFT HEART CATHETERIZATION WITH CORONARY ANGIOGRAM;  Surgeon: Sinclair Grooms, MD;  Location: [facility] CATH LAB;  Service: Cardiovascular;  Laterality: N/A;  . LEFT HEART CATHETERIZATION WITH CORONARY ANGIOGRAM N/A 09/12/2012   Procedure: LEFT HEART CATHETERIZATION WITH CORONARY ANGIOGRAM;  Surgeon: Sinclair Grooms, MD;  Location: [facility] CATH LAB;  Service: Cardiovascular;  Laterality: N/A;  . LIPOMA EXCISION     Biospy only; left parotid gland  . LITHOTRIPSY    . none    . PERCUTANEOUS CORONARY STENT INTERVENTION (PCI-S) N/A 09/17/2012   Procedure: PERCUTANEOUS CORONARY STENT INTERVENTION (PCI-S);  Surgeon: Sinclair Grooms, MD;  Location: [facility] CATH LAB;  Service: Cardiovascular;  Laterality: N/A;  . TRIGGER FINGER RELEASE Left 12/21/2015   Procedure: LEFT LONG FINGER TRIGGER RELEASE ;  Surgeon: Milly Jakob, MD;  Location: [place];  Service: Orthopedics;  Laterality: Left;    Current Medications: Current Meds  Medication Sig  . albuterol (PROVENTIL HFA) 108 (90 BASE) MCG/ACT inhaler Inhale 2 puffs into the lungs every 6 (six) hours as needed for wheezing or shortness of breath.   [name] albuterol (PROVENTIL) (2.5 MG/3ML) 0.083% nebulizer solution Take 2.5 mg by nebulization every 6 (six) hours as needed for wheezing or shortness of breath.   . allopurinol (ZYLOPRIM) 100 MG tablet Take 100 mg by mouth daily.  [name] amLODipine (NORVASC) 5 MG tablet Take 5 mg by mouth daily.  [name] aspirin EC 81 MG tablet Take 1 tablet (81 mg total) by mouth daily.  [name] atorvastatin (LIPITOR) 80 MG tablet Take 1 tablet (80 mg total) by mouth daily at 6 PM.  . carvedilol (COREG) 3.125 MG tablet Take 3.125 mg by mouth 2 (two) times daily with a meal.  . cholecalciferol (VITAMIN D3) 25 MCG (1000 UT) tablet Take 1,000 Units by mouth daily.  . clopidogrel (PLAVIX) 75 MG tablet Take 1 tablet (75 mg total) by mouth daily.  . Ferrous Sulfate (IRON) 325 (65 FE) MG TABS Take 325 mg by  mouth daily.   . finasteride (PROSCAR) 5 MG tablet Take 1 tablet (5 mg total) by mouth daily.  . furosemide (LASIX) 20 MG tablet Take 20 mg by mouth every other day.  . gabapentin (NEURONTIN) 300 MG capsule Take 1 capsule (300 mg total) by mouth at bedtime.  [name] glipiZIDE (GLUCOTROL) 5 MG tablet Take 5 mg by mouth 2 (two) times daily before a meal.   . guaifenesin (HUMIBID E) 400 MG TABS tablet Take 400 mg by mouth at bedtime.   . isosorbide mononitrate (IMDUR) 120 MG 24 hr tablet Take 1 tablet (120 mg total) by mouth daily.  . nitroGLYCERIN (NITROSTAT) 0.4 MG SL tablet Place 0.4 mg under the tongue every 5 (five) minutes as  needed for chest pain.  . pantoprazole (PROTONIX) 40 MG tablet Take 40 mg by mouth daily.  . primidone (MYSOLINE) 50 MG tablet Take 50 mg by mouth at bedtime.   . sucralfate (CARAFATE) 1 GM/10ML suspension Take 1 g by mouth 4 (four) times daily.  . traMADol (ULTRAM) 50 MG tablet Take 1 tablet (50 mg total) by mouth at bedtime as needed for moderate pain.  . vitamin B-12 (CYANOCOBALAMIN) 1000 MCG tablet Take 1,000 mcg by mouth daily.     Allergies:   Cimetidine   Social History   Socioeconomic History  . Marital status: Married    Spouse name: Not on file  . Number of children: Not on file  . Years of education: Not on file  . Highest education level: Not on file  Occupational History  . Occupation: retired  Tobacco Use  . Smoking status: Former Smoker    Packs/day: 1.00    Years: 64.00    Pack years: 64.00    Types: Cigarettes    Quit date: 05/29/2011    Years since quitting: 8.4  . Smokeless tobacco: Never Used  Vaping Use  . Vaping Use: Never used  Substance and Sexual Activity  . Alcohol use: Yes    Comment: rarely  . Drug use: No  . Sexual activity: Not Currently  Other Topics Concern  . Not on file  Social History Narrative   Denies Caffeine use    Social Determinants of Health   Financial Resource Strain:   . Difficulty of Paying Living  Expenses:   Food Insecurity:   . Worried About Charity fundraiser in the Last Year:   . Arboriculturist in the Last Year:   Transportation Needs:   . Film/video editor (Medical):   [name] Lack of Transportation (Non-Medical):   Physical Activity:   . Days of Exercise per Week:   . Minutes of Exercise per Session:   Stress:   . Feeling of Stress :   Social Connections:   . Frequency of Communication with Friends and Family:   . Frequency of Social Gatherings with Friends and Family:   . Attends Religious Services:   . Active Member of Clubs or Organizations:   . Attends Archivist Meetings:   [name] Marital Status:      Family History: The patient's family history includes Aneurysm in his mother; Aortic aneurysm in his father; COPD in his sister; Cancer in his brother; Other in his brother, sister, and sister.  ROS:   Please see the history of present illness.    Joint discomfort.  Cramps at night.  Numb feet due to neuropathy.  Inability to sleep comfortably.  All other systems reviewed and are negative.  EKGs/Labs/Other Studies Reviewed:    The following studies were reviewed today: Left heart cath with coronary angiography November 2020:  Patent left main  50% mid LAD with 80% stenosis in the first diagonal (small caliber).  Ostial circumflex 60 to 70%.  50 to 60% stenosis in the proximal stent.  Segmental 70% stenosis in the first obtuse marginal proximal to the distal first obtuse marginal stent which is widely patent.  Continuation of circumflex is small ending on 2 small obtuse marginal branches.  Right coronary contains intermediate stenosis in the mid and distal segment of up to 60%.  No focal high-grade obstruction.  Normal left ventricular end-diastolic pressure.  Contrast not given to minimize load.  Findings are very similar to 2018.  The diffusely diseased area in the first obtuse marginal may be slightly worse.  Recommendations:   Continue medical  therapy.  Circumflex disease is diffuse.  In absence of definite change in overall anatomy, additional exposure to contrast is felt to be higher risk (acute kidney injury) than continuing medical therapy.  If ambulates and remained stable, could discharge later today or in a.m.  Will need to have a follow-up basic metabolic panel in 48 hours.   EKG:  EKG is not performed today.  Recent Labs: 02/24/2019: Magnesium 1.6 02/25/2019: Hemoglobin 13.7; Platelets 248 02/27/2019: BUN 38; Creatinine, Ser 2.16; Potassium 4.3; Sodium 136  Recent Lipid Panel    Component Value Date/Time   CHOL 220 (H) 10/14/2017 0742   TRIG 156 (H) 10/14/2017 0742   HDL 39 (L) 10/14/2017 0742   CHOLHDL 5.6 10/14/2017 0742   VLDL 31 10/14/2017 0742   LDLCALC 150 (H) 10/14/2017 0742    Physical Exam:    VS:  BP (!) 134/70   Pulse 72   Ht 5\' 8"  (1.727 m)   Wt (!) 218 lb (98.9 kg)   SpO2 97%   BMI 33.15 kg/m     Wt Readings from Last 3 Encounters:  11/05/19 (!) 218 lb (98.9 kg)  04/23/19 223 lb 12.8 oz (101.5 kg)  03/11/19 208 lb (94.3 kg)     GEN: Abdominal obesity. No acute distress HEENT: Normal NECK: No JVD. LYMPHATICS: No lymphadenopathy CARDIAC:  RRR without murmur, gallop, or edema. VASCULAR:  Normal Pulses. No bruits. RESPIRATORY:  Clear to auscultation without rales, wheezing or rhonchi  ABDOMEN: Soft, non-tender, non-distended, No pulsatile mass, MUSCULOSKELETAL: No deformity  SKIN: Warm and dry NEUROLOGIC:  Alert and oriented x 3 PSYCHIATRIC:  Normal affect   ASSESSMENT:    1. Coronary artery disease involving native coronary artery of native heart with angina pectoris (HCC)   2. Stage 3 chronic kidney disease, unspecified whether stage 3a or 3b CKD   3. Chronic diastolic CHF (congestive heart failure) ([place])   4. Essential hypertension   5. OSA (obstructive sleep apnea)   6. Pure hypercholesterolemia   7. Leg cramping   8. Hyperkalemia   9. Type 2 diabetes mellitus with  complication, without long-term current use of insulin ([place])   10. Educated about COVID-19 virus infection    PLAN:    In order of problems listed above:  1. Secondary prevention including 150 minutes of moderate activity per week is discussed. 2. Potassium is 5.1.  This was noted on the [place]. 3. Continue beta-blocker which has been switched to carvedilol 3.125 mg twice daily by the [facility].  Other therapy should continue to include Imdur 120 mg/day.  Also on furosemide 20 mg/day.  Probably not having a significant impact given creatinine elevation. 4. Continue amlodipine, Imdur, carvedilol, and intermittent furosemide. 5. Encouraged to be compliant with CPAP. 6. Consider decreasing Lipitor dose to 40 mg/day as he complains of spontaneous episodes of muscle cramping.  From my standpoint I like where his LDL is and I consider the cramping to be a nuisance. 7. Consider decreasing intensity of statin therapy. 21. Being followed at the [place]. 9. Farxiga/dapagliflozin 5 mg/day for survival benefit, diastolic heart failure, and better glucose control.  Basic metabolic panel in 7 to 10 days. 10. Updated education concerning COVID-19 and the need to mask since vaccinated persons can asymptomatically spread the delta variant of COVID-19.     Medication Adjustments/Labs and Tests Ordered: Current medicines are reviewed at  length with the patient today.  Concerns regarding medicines are outlined above.  Orders Placed This Encounter  Procedures  . Basic metabolic panel   Meds ordered this encounter  Medications  . dapagliflozin propanediol (FARXIGA) 5 MG TABS tablet    Sig: Take 1 tablet (5 mg total) by mouth daily before breakfast.    Dispense:  30 tablet    Refill:  11    Patient Instructions  Medication Instructions:  1) START Farxiga 5mg  once daily  *If you need a refill on your cardiac medications before your next appointment, please call your pharmacy*   Lab Work: BMET in 2  weeks  If you have labs (blood work) drawn today and your tests are completely normal, you will receive your results only by: [name] MyChart Message (if you have MyChart) OR . A paper copy in the mail If you have any lab test that is abnormal or we need to change your treatment, we will call you to review the results.   Testing/Procedures: None   Follow-Up: At [facility], you and your health needs are our priority.  As part of our continuing mission to provide you with exceptional heart care, we have created designated Provider Care Teams.  These Care Teams include your primary Cardiologist (physician) and Advanced Practice Providers (APPs -  Physician Assistants and Nurse Practitioners) who all work together to provide you with the care you need, when you need it.  We recommend signing up for the patient portal called "MyChart".  Sign up information is provided on this After Visit Summary.  MyChart is used to connect with patients for Virtual Visits (Telemedicine).  Patients are able to view lab/test results, encounter notes, upcoming appointments, etc.  Non-urgent messages can be sent to your provider as well.   To learn more about what you can do with MyChart, go to [email or web address].    Your next appointment:   6 month(s)  The format for your next appointment:   In Person  Provider:   You may see Sinclair Grooms, MD or one of the following Advanced Practice Providers on your designated Care Team:    Truitt Merle, NP  Cecilie Kicks, NP  Kathyrn Drown, NP    Other Instructions      Signed, Sinclair Grooms, MD  11/05/2019 10:08 AM    [place]

## 2019-11-05 ENCOUNTER — Other Ambulatory Visit: Payer: Self-pay

## 2019-11-05 ENCOUNTER — Ambulatory Visit (INDEPENDENT_AMBULATORY_CARE_PROVIDER_SITE_OTHER): Payer: Medicare Other | Admitting: Interventional Cardiology

## 2019-11-05 ENCOUNTER — Encounter: Payer: Self-pay | Admitting: Interventional Cardiology

## 2019-11-05 VITALS — BP 134/70 | HR 72 | Ht 68.0 in | Wt 218.0 lb

## 2019-11-05 DIAGNOSIS — G4733 Obstructive sleep apnea (adult) (pediatric): Secondary | ICD-10-CM | POA: Diagnosis not present

## 2019-11-05 DIAGNOSIS — I5032 Chronic diastolic (congestive) heart failure: Secondary | ICD-10-CM | POA: Diagnosis not present

## 2019-11-05 DIAGNOSIS — R252 Cramp and spasm: Secondary | ICD-10-CM | POA: Diagnosis not present

## 2019-11-05 DIAGNOSIS — Z7189 Other specified counseling: Secondary | ICD-10-CM

## 2019-11-05 DIAGNOSIS — I10 Essential (primary) hypertension: Secondary | ICD-10-CM

## 2019-11-05 DIAGNOSIS — N183 Chronic kidney disease, stage 3 unspecified: Secondary | ICD-10-CM

## 2019-11-05 DIAGNOSIS — I25119 Atherosclerotic heart disease of native coronary artery with unspecified angina pectoris: Secondary | ICD-10-CM | POA: Diagnosis not present

## 2019-11-05 DIAGNOSIS — E875 Hyperkalemia: Secondary | ICD-10-CM

## 2019-11-05 DIAGNOSIS — E118 Type 2 diabetes mellitus with unspecified complications: Secondary | ICD-10-CM

## 2019-11-05 DIAGNOSIS — E7800 Pure hypercholesterolemia, unspecified: Secondary | ICD-10-CM

## 2019-11-05 MED ORDER — DAPAGLIFLOZIN PROPANEDIOL 5 MG PO TABS: 5.0000 mg | ORAL_TABLET | Freq: Every day | ORAL | 11 refills | Status: DC

## 2019-11-05 NOTE — Patient Instructions (Signed)
Medication Instructions:  1) START Farxiga 5mg  once daily  *If you need a refill on your cardiac medications before your next appointment, please call your pharmacy*   Lab Work: BMET in 2 weeks  If you have labs (blood work) drawn today and your tests are completely normal, you will receive your results only by:  [place] (if you have MyChart) OR  A paper copy in the mail If you have any lab test that is abnormal or we need to change your treatment, we will call you to review the results.   Testing/Procedures: None   Follow-Up: At [facility], you and your health needs are our priority.  As part of our continuing mission to provide you with exceptional heart care, we have created designated Provider Care Teams.  These Care Teams include your primary Cardiologist (physician) and Advanced Practice Providers (APPs -  Physician Assistants and Nurse Practitioners) who all work together to provide you with the care you need, when you need it.  We recommend signing up for the patient portal called "MyChart".  Sign up information is provided on this After Visit Summary.  MyChart is used to connect with patients for Virtual Visits (Telemedicine).  Patients are able to view lab/test results, encounter notes, upcoming appointments, etc.  Non-urgent messages can be sent to your provider as well.   To learn more about what you can do with MyChart, go to [email or web address].    Your next appointment:   6 month(s)  The format for your next appointment:   In Person  Provider:   You may see Sinclair Grooms, MD or one of the following Advanced Practice Providers on your designated Care Team:    Truitt Merle, NP  Cecilie Kicks, NP  Kathyrn Drown, NP    Other Instructions

## 2019-11-12 DIAGNOSIS — E1140 Type 2 diabetes mellitus with diabetic neuropathy, unspecified: Secondary | ICD-10-CM | POA: Diagnosis not present

## 2019-11-12 DIAGNOSIS — N183 Chronic kidney disease, stage 3 unspecified: Secondary | ICD-10-CM | POA: Diagnosis not present

## 2019-11-12 DIAGNOSIS — J449 Chronic obstructive pulmonary disease, unspecified: Secondary | ICD-10-CM | POA: Diagnosis not present

## 2019-11-12 DIAGNOSIS — Z0000 Encounter for general adult medical examination without abnormal findings: Secondary | ICD-10-CM | POA: Diagnosis not present

## 2019-11-12 DIAGNOSIS — E7800 Pure hypercholesterolemia, unspecified: Secondary | ICD-10-CM | POA: Diagnosis not present

## 2019-11-18 DIAGNOSIS — J449 Chronic obstructive pulmonary disease, unspecified: Secondary | ICD-10-CM | POA: Diagnosis not present

## 2019-11-18 DIAGNOSIS — N183 Chronic kidney disease, stage 3 unspecified: Secondary | ICD-10-CM | POA: Diagnosis not present

## 2019-11-18 DIAGNOSIS — N400 Enlarged prostate without lower urinary tract symptoms: Secondary | ICD-10-CM | POA: Diagnosis not present

## 2019-11-18 DIAGNOSIS — I10 Essential (primary) hypertension: Secondary | ICD-10-CM | POA: Diagnosis not present

## 2019-11-18 DIAGNOSIS — I2510 Atherosclerotic heart disease of native coronary artery without angina pectoris: Secondary | ICD-10-CM | POA: Diagnosis not present

## 2019-11-18 DIAGNOSIS — E1140 Type 2 diabetes mellitus with diabetic neuropathy, unspecified: Secondary | ICD-10-CM | POA: Diagnosis not present

## 2019-11-18 DIAGNOSIS — E7800 Pure hypercholesterolemia, unspecified: Secondary | ICD-10-CM | POA: Diagnosis not present

## 2019-11-19 ENCOUNTER — Other Ambulatory Visit: Payer: Self-pay

## 2019-11-19 ENCOUNTER — Other Ambulatory Visit: Payer: Medicare Other | Admitting: *Deleted

## 2019-11-19 DIAGNOSIS — I5032 Chronic diastolic (congestive) heart failure: Secondary | ICD-10-CM

## 2019-11-19 DIAGNOSIS — I25119 Atherosclerotic heart disease of native coronary artery with unspecified angina pectoris: Secondary | ICD-10-CM | POA: Diagnosis not present

## 2019-11-19 DIAGNOSIS — N183 Chronic kidney disease, stage 3 unspecified: Secondary | ICD-10-CM

## 2019-11-19 LAB — BASIC METABOLIC PANEL
BUN/Creatinine Ratio: 18 (ref 10–24)
BUN: 43 mg/dL — ABNORMAL HIGH (ref 8–27)
CO2: 21 mmol/L (ref 20–29)
Calcium: 9.8 mg/dL (ref 8.6–10.2)
Chloride: 98 mmol/L (ref 96–106)
Creatinine, Ser: 2.35 mg/dL — ABNORMAL HIGH (ref 0.76–1.27)
GFR calc Af Amer: 29 mL/min/{1.73_m2} — ABNORMAL LOW (ref >59)
GFR calc non Af Amer: 25 mL/min/{1.73_m2} — ABNORMAL LOW (ref >59)
Glucose: 222 mg/dL — ABNORMAL HIGH (ref 65–99)
Potassium: 4.5 mmol/L (ref 3.5–5.2)
Sodium: 135 mmol/L (ref 134–144)

## 2019-11-20 ENCOUNTER — Other Ambulatory Visit: Payer: Self-pay | Admitting: *Deleted

## 2019-11-20 DIAGNOSIS — N1832 Chronic kidney disease, stage 3b: Secondary | ICD-10-CM

## 2019-12-02 DIAGNOSIS — M795 Residual foreign body in soft tissue: Secondary | ICD-10-CM | POA: Diagnosis not present

## 2019-12-12 DIAGNOSIS — M65331 Trigger finger, right middle finger: Secondary | ICD-10-CM | POA: Diagnosis not present

## 2019-12-12 DIAGNOSIS — M65332 Trigger finger, left middle finger: Secondary | ICD-10-CM | POA: Diagnosis not present

## 2019-12-17 DIAGNOSIS — E875 Hyperkalemia: Secondary | ICD-10-CM | POA: Diagnosis not present

## 2019-12-17 DIAGNOSIS — E1122 Type 2 diabetes mellitus with diabetic chronic kidney disease: Secondary | ICD-10-CM | POA: Diagnosis not present

## 2019-12-17 DIAGNOSIS — K219 Gastro-esophageal reflux disease without esophagitis: Secondary | ICD-10-CM | POA: Diagnosis not present

## 2019-12-17 DIAGNOSIS — I5032 Chronic diastolic (congestive) heart failure: Secondary | ICD-10-CM | POA: Diagnosis not present

## 2019-12-17 DIAGNOSIS — G629 Polyneuropathy, unspecified: Secondary | ICD-10-CM | POA: Diagnosis not present

## 2019-12-17 DIAGNOSIS — J449 Chronic obstructive pulmonary disease, unspecified: Secondary | ICD-10-CM | POA: Diagnosis not present

## 2019-12-17 DIAGNOSIS — I129 Hypertensive chronic kidney disease with stage 1 through stage 4 chronic kidney disease, or unspecified chronic kidney disease: Secondary | ICD-10-CM | POA: Diagnosis not present

## 2019-12-17 DIAGNOSIS — N184 Chronic kidney disease, stage 4 (severe): Secondary | ICD-10-CM | POA: Diagnosis not present

## 2019-12-23 ENCOUNTER — Other Ambulatory Visit: Payer: 59

## 2019-12-24 ENCOUNTER — Other Ambulatory Visit: Payer: Medicare Other | Admitting: *Deleted

## 2019-12-24 ENCOUNTER — Other Ambulatory Visit: Payer: Self-pay

## 2019-12-24 DIAGNOSIS — N1832 Chronic kidney disease, stage 3b: Secondary | ICD-10-CM

## 2019-12-24 LAB — BASIC METABOLIC PANEL
BUN/Creatinine Ratio: 19 (ref 10–24)
BUN: 52 mg/dL — ABNORMAL HIGH (ref 8–27)
CO2: 23 mmol/L (ref 20–29)
Calcium: 9.5 mg/dL (ref 8.6–10.2)
Chloride: 98 mmol/L (ref 96–106)
Creatinine, Ser: 2.74 mg/dL — ABNORMAL HIGH (ref 0.76–1.27)
GFR calc Af Amer: 24 mL/min/{1.73_m2} — ABNORMAL LOW (ref >59)
GFR calc non Af Amer: 20 mL/min/{1.73_m2} — ABNORMAL LOW (ref >59)
Glucose: 197 mg/dL — ABNORMAL HIGH (ref 65–99)
Potassium: 4.7 mmol/L (ref 3.5–5.2)
Sodium: 138 mmol/L (ref 134–144)

## 2019-12-25 ENCOUNTER — Other Ambulatory Visit: Payer: Self-pay | Admitting: *Deleted

## 2019-12-25 ENCOUNTER — Telehealth: Payer: Self-pay | Admitting: Interventional Cardiology

## 2019-12-25 DIAGNOSIS — N183 Chronic kidney disease, stage 3 unspecified: Secondary | ICD-10-CM

## 2019-12-25 NOTE — Telephone Encounter (Signed)
Patient states she is returning a call. 

## 2019-12-25 NOTE — Telephone Encounter (Signed)
Pt called back and we discussed lab results and recommendations.  He will come for labs on 9/24.

## 2020-01-02 ENCOUNTER — Other Ambulatory Visit: Payer: Medicare Other | Admitting: *Deleted

## 2020-01-02 ENCOUNTER — Other Ambulatory Visit: Payer: Self-pay

## 2020-01-02 DIAGNOSIS — N183 Chronic kidney disease, stage 3 unspecified: Secondary | ICD-10-CM | POA: Diagnosis not present

## 2020-01-02 LAB — BASIC METABOLIC PANEL
BUN/Creatinine Ratio: 15 (ref 10–24)
BUN: 37 mg/dL — ABNORMAL HIGH (ref 8–27)
CO2: 24 mmol/L (ref 20–29)
Calcium: 9.7 mg/dL (ref 8.6–10.2)
Chloride: 101 mmol/L (ref 96–106)
Creatinine, Ser: 2.46 mg/dL — ABNORMAL HIGH (ref 0.76–1.27)
GFR calc Af Amer: 27 mL/min/{1.73_m2} — ABNORMAL LOW (ref >59)
GFR calc non Af Amer: 23 mL/min/{1.73_m2} — ABNORMAL LOW (ref >59)
Glucose: 165 mg/dL — ABNORMAL HIGH (ref 65–99)
Potassium: 4.5 mmol/L (ref 3.5–5.2)
Sodium: 140 mmol/L (ref 134–144)

## 2020-01-03 ENCOUNTER — Encounter: Payer: Self-pay | Admitting: Interventional Cardiology

## 2020-01-27 DIAGNOSIS — M25532 Pain in left wrist: Secondary | ICD-10-CM | POA: Diagnosis not present

## 2020-02-29 DIAGNOSIS — Z23 Encounter for immunization: Secondary | ICD-10-CM | POA: Diagnosis not present

## 2020-04-21 ENCOUNTER — Telehealth: Payer: Self-pay | Admitting: Interventional Cardiology

## 2020-04-21 MED ORDER — DAPAGLIFLOZIN PROPANEDIOL 5 MG PO TABS: 5.0000 mg | ORAL_TABLET | Freq: Every day | ORAL | 11 refills | Status: DC

## 2020-04-21 NOTE — Telephone Encounter (Signed)
**Note De-Identified  Obfuscation** The pt states that he applied for asst [place] over [place] and was advised that he has been approved for a year and that all they need is a prescription from Dr Tamala Julian. He states that [place] and [place] advised him that they would contact us for a prescription for his Farxiga 5 mg #30 with 11 refills and that once they receive the prescription the will mail him a 30 day supply monthly to his home.  I advised the pt that I will e-scribe his [place] RX to [facility] who is the pharmacy for [place] and that I will also address if they contact us concerning this prescription.  He verbalized understanding and thanked me or calling him back.

## 2020-04-21 NOTE — Telephone Encounter (Signed)
Pt c/o medication issue:  1. Name of Medication: dapagliflozin propanediol (FARXIGA) 5 MG TABS tablet  2. How are you currently taking this medication (dosage and times per day)? As prescribed  3. Are you having a reaction (difficulty breathing--STAT)? No   4. What is your medication issue? Patient states he was initially unable to have the medication approved through the [place] and it is around $500 to purchase without assistance. Unable to afford it, the patient got samples from his PCP, Dr. Harrington Challenger, and applied for [employer]. He states the application was approved and [employer] will be mailing an order directly to [place] office.

## 2020-04-30 ENCOUNTER — Other Ambulatory Visit: Payer: Self-pay

## 2020-04-30 ENCOUNTER — Ambulatory Visit (INDEPENDENT_AMBULATORY_CARE_PROVIDER_SITE_OTHER): Payer: Medicare Other | Admitting: Podiatry

## 2020-04-30 DIAGNOSIS — N184 Chronic kidney disease, stage 4 (severe): Secondary | ICD-10-CM | POA: Diagnosis not present

## 2020-04-30 DIAGNOSIS — L600 Ingrowing nail: Secondary | ICD-10-CM | POA: Diagnosis not present

## 2020-04-30 DIAGNOSIS — E1142 Type 2 diabetes mellitus with diabetic polyneuropathy: Secondary | ICD-10-CM

## 2020-04-30 DIAGNOSIS — M79676 Pain in unspecified toe(s): Secondary | ICD-10-CM | POA: Diagnosis not present

## 2020-04-30 DIAGNOSIS — D689 Coagulation defect, unspecified: Secondary | ICD-10-CM

## 2020-04-30 NOTE — Progress Notes (Signed)
°  Subjective:  Patient ID: Marco Acevedo, male    DOB: [birth year],  MRN: [id number]  Chief Complaint  Patient presents with   Ingrown Toenail    PT STATES HE HAS LEFT HALLUX INGROWN THAT HE HAS BEEN DEALING WITH FOR ABOUT 2 YEARS     86 y.o. male presents with the above complaint. History confirmed with patient.   Objective:  Physical Exam: warm, good capillary refill, no trophic changes or ulcerative lesions, normal DP and PT pulses and decreased protective sensation.  Painful ingrowing nail at both borders of the left, hallux; without warmth, erythema or drainage  Assessment:   1. Ingrown nail   2. Pain around toenail   3. CKD (chronic kidney disease) stage 4, GFR 15-29 ml/min (HCC)   4. DM type 2 with diabetic peripheral neuropathy (HCC)   5. Coagulation defect ([place])      Plan:  Patient was evaluated and treated and all questions answered.  Ingrown Nail, left -Nail gently debrided in slant back fashion to patient relief. Dressed with abx ointment and band-aid. -Qualifies for at risk foot care due to DM, DPN, and CKD4. Will have patient see one of our doctors for at risk foot care. -F/u in 1 month for nail check and further care.  Return in about 1 month (around 05/31/2020) for Nail check, at risk foot care.

## 2020-05-04 DIAGNOSIS — M65331 Trigger finger, right middle finger: Secondary | ICD-10-CM | POA: Diagnosis not present

## 2020-05-04 DIAGNOSIS — M65332 Trigger finger, left middle finger: Secondary | ICD-10-CM | POA: Diagnosis not present

## 2020-05-10 NOTE — Progress Notes (Signed)
Cardiology Office Note:    Date:  05/12/2020   ID:  Marco Acevedo, DOB 10/18/1934, MRN [id number]  PCP:  Lawerance Cruel, MD  Cardiologist:  Sinclair Grooms, MD   Referring MD: Lawerance Cruel, MD   Chief Complaint  Patient presents with  . Coronary Artery Disease  . Hypertension    History of Present Illness:    Marco Acevedo is a 86 y.o. male with a hx of CAD and prior circumflex stents, Chronic DHF, hyperlipidemia, hypertension, obesity and OSA. History of CVA October 14, 2017 related to, left brain atheroembolic event from the left carotid. Underwent left carotid endarterectomy on 10/19/2017.Cardiac cath November 2020 for progressive chest pain with stable anatomy. Carafate resolved the chest discomfort as it did in 2019.  Has not been able to play golf recently because of the weather.  Has not used nitroglycerin in greater than 4 weeks.  He is always somewhat confused about whether he is having angina or heartburn when he uses nitro.  He has not had any limitations in physical activity when he has been more sedentary because of the weather.  No medication side effects.  Denies claudication.  Has chronic cough, some phlegm production, and chronic shortness of breath.  No orthopnea.  Denies edema.  Past Medical History:  Diagnosis Date  . Anemia    low iron  . Arthritis   . BPH (benign prostatic hyperplasia)   . Bruises easily   . CAD (coronary artery disease)    a. per Dr. Thompson Caul note, prior stenting of Cx in 2007 and 2012. b. DES to Cx in 09/2012.  [name] Chronic diastolic CHF (congestive heart failure) ([place])   . CKD (chronic kidney disease), stage [name] ([place])   . Claustrophobia   . Complication of anesthesia    has to have head elevated to keep from strangling on post nasal drip  . COPD (chronic obstructive pulmonary disease) ([place])   . Diabetes mellitus type II    type 2  . Diarrhea 2015   had for a year and a half  . Dyspnea   . GERD (gastroesophageal reflux  disease)   . Gout   . Granuloma annulare   . History of hiatal hernia   . History of kidney stones    [place]py  . Hyperlipidemia   . Hypertension   . Neuropathy   . Obesity   . OSA (obstructive sleep apnea)   . Right sided weakness   . Stroke ([place]) 10/2017   Weakness right arm and leg  . Wears dentures    top  . Wears glasses     Past Surgical History:  Procedure Laterality Date  . BLEPHAROPLASTY    . CARDIAC CATHETERIZATION  [phone]   X 2 stents  . CHOLECYSTECTOMY N/A 09/23/2014   Procedure: LAPAROSCOPIC CHOLECYSTECTOMY WITH INTRAOPERATIVE CHOLANGIOGRAM;  Surgeon: Autumn Messing [name], MD;  Location: [place];  Service: General;  Laterality: N/A;  . COLONOSCOPY    . COLONOSCOPY WITH PROPOFOL N/A 03/22/2016   Procedure: COLONOSCOPY WITH PROPOFOL;  Surgeon: Wilford Corner, MD;  Location: [facility] ENDOSCOPY;  Service: Endoscopy;  Laterality: N/A;  . ENDARTERECTOMY Left 10/19/2017   Procedure: ENDARTERECTOMY CAROTID LEFT;  Surgeon: [name], MD;  Location: [facility] OR;  Service: Vascular;  Laterality: Left;  . ESOPHAGOGASTRODUODENOSCOPY (EGD) WITH PROPOFOL N/A 03/22/2016   Procedure: ESOPHAGOGASTRODUODENOSCOPY (EGD) WITH PROPOFOL;  Surgeon: Wilford Corner, MD;  Location: [facility] ENDOSCOPY;  Service: Endoscopy;  Laterality: N/A;  . EYE SURGERY  both cataracts  . KNEE ARTHROSCOPY WITH MEDIAL MENISECTOMY Right 08/19/2013   Procedure: RIGHT KNEE ARTHROSCOPY WITH PARTIAL MEDIAL MENISECTOMY AND CHONDROPLASTY;  Surgeon: Hessie Dibble, MD;  Location: [place];  Service: Orthopedics;  Laterality: Right;  . LEFT HEART CATH AND CORONARY ANGIOGRAPHY N/A 06/28/2016   Procedure: Left Heart Cath and Coronary Angiography;  Surgeon: Belva Crome, MD;  Location: [place] CV LAB;  Service: Cardiovascular;  Laterality: N/A;  . LEFT HEART CATH AND CORONARY ANGIOGRAPHY N/A 02/25/2019   Procedure: LEFT HEART CATH AND CORONARY ANGIOGRAPHY;  Surgeon: Belva Crome, MD;  Location: [place]  CV LAB;  Service: Cardiovascular;  Laterality: N/A;  . LEFT HEART CATHETERIZATION WITH CORONARY ANGIOGRAM N/A 10/03/2011   Procedure: LEFT HEART CATHETERIZATION WITH CORONARY ANGIOGRAM;  Surgeon: Sinclair Grooms, MD;  Location: [facility] CATH LAB;  Service: Cardiovascular;  Laterality: N/A;  . LEFT HEART CATHETERIZATION WITH CORONARY ANGIOGRAM N/A 09/12/2012   Procedure: LEFT HEART CATHETERIZATION WITH CORONARY ANGIOGRAM;  Surgeon: Sinclair Grooms, MD;  Location: [facility] CATH LAB;  Service: Cardiovascular;  Laterality: N/A;  . LIPOMA EXCISION     Biospy only; left parotid gland  . LITHOTRIPSY    . none    . PERCUTANEOUS CORONARY STENT INTERVENTION (PCI-S) N/A 09/17/2012   Procedure: PERCUTANEOUS CORONARY STENT INTERVENTION (PCI-S);  Surgeon: Sinclair Grooms, MD;  Location: [facility] CATH LAB;  Service: Cardiovascular;  Laterality: N/A;  . TRIGGER FINGER RELEASE Left 12/21/2015   Procedure: LEFT LONG FINGER TRIGGER RELEASE ;  Surgeon: Milly Jakob, MD;  Location: [place];  Service: Orthopedics;  Laterality: Left;    Current Medications: Current Meds  Medication Sig  . albuterol (PROVENTIL) (2.5 MG/3ML) 0.083% nebulizer solution Take 2.5 mg by nebulization every 6 (six) hours as needed for wheezing or shortness of breath.   [name] albuterol (VENTOLIN HFA) 108 (90 Base) MCG/ACT inhaler Inhale 2 puffs into the lungs every 6 (six) hours as needed for wheezing or shortness of breath.   . allopurinol (ZYLOPRIM) 100 MG tablet Take 100 mg by mouth daily.  [name] amLODipine (NORVASC) 5 MG tablet Take 5 mg by mouth daily.  [name] aspirin EC 81 MG tablet Take 1 tablet (81 mg total) by mouth daily.  [name] atorvastatin (LIPITOR) 80 MG tablet Take 1 tablet (80 mg total) by mouth daily at 6 PM.  . carvedilol (COREG) 3.125 MG tablet Take 3.125 mg by mouth 2 (two) times daily with a meal.  . cholecalciferol (VITAMIN D3) 25 MCG (1000 UT) tablet Take 1,000 Units by mouth daily.  . clopidogrel (PLAVIX) 75 MG tablet Take 1  tablet (75 mg total) by mouth daily.  . dapagliflozin propanediol (FARXIGA) 5 MG TABS tablet Take 1 tablet (5 mg total) by mouth daily before breakfast.  . Ferrous Sulfate (IRON) 325 (65 FE) MG TABS Take 325 mg by mouth daily.   . finasteride (PROSCAR) 5 MG tablet Take 1 tablet (5 mg total) by mouth daily.  . furosemide (LASIX) 20 MG tablet Take 20 mg by mouth every other day.  . gabapentin (NEURONTIN) 300 MG capsule Take 1 capsule (300 mg total) by mouth at bedtime.  [name] glipiZIDE (GLUCOTROL) 5 MG tablet Take 5 mg by mouth 2 (two) times daily before a meal.   . guaifenesin (HUMIBID E) 400 MG TABS tablet Take 400 mg by mouth at bedtime.  . isosorbide mononitrate (IMDUR) 120 MG 24 hr tablet Take 1 tablet (120 mg total) by mouth daily.  [name]  nitroGLYCERIN (NITROSTAT) 0.4 MG SL tablet Place 0.4 mg under the tongue every 5 (five) minutes as needed for chest pain.  . pantoprazole (PROTONIX) 40 MG tablet Take 40 mg by mouth daily.  . primidone (MYSOLINE) 50 MG tablet Take 50 mg by mouth at bedtime.   . sucralfate (CARAFATE) 1 GM/10ML suspension Take 1 g by mouth 4 (four) times daily.  . traMADol (ULTRAM) 50 MG tablet Take 1 tablet (50 mg total) by mouth at bedtime as needed for moderate pain.  . vitamin B-12 (CYANOCOBALAMIN) 1000 MCG tablet Take 1,000 mcg by mouth daily.     Allergies:   Cimetidine and Metformin hcl   Social History   Socioeconomic History  . Marital status: Married    Spouse name: Not on file  . Number of children: Not on file  . Years of education: Not on file  . Highest education level: Not on file  Occupational History  . Occupation: retired  Tobacco Use  . Smoking status: Former Smoker    Packs/day: 1.00    Years: 64.00    Pack years: 64.00    Types: Cigarettes    Quit date: 05/29/2011    Years since quitting: 8.9  . Smokeless tobacco: Never Used  Vaping Use  . Vaping Use: Never used  Substance and Sexual Activity  . Alcohol use: Yes    Comment: rarely  . Drug  use: No  . Sexual activity: Not Currently  Other Topics Concern  . Not on file  Social History Narrative   Denies Caffeine use    Social Determinants of Health   Financial Resource Strain: Not on file  Food Insecurity: Not on file  Transportation Needs: Not on file  Physical Activity: Not on file  Stress: Not on file  Social Connections: Not on file     Family History: The patient's family history includes Aneurysm in his mother; Aortic aneurysm in his father; COPD in his sister; Cancer in his brother; Other in his brother, sister, and sister.  ROS:   Please see the history of present illness.    Followed at the [facility].  Send lab reports sent to [place] in my chart.  No medication side effects.  Please see laboratory data below.  Sleeps well.  Appetite is been stable.  Not noticed blood in his urine or stool.  No neurological symptoms.  All other systems reviewed and are negative.  EKGs/Labs/Other Studies Reviewed:    The following studies were reviewed today:  Recent labs on 04/27/2020 at the [facility] revealed LDL cholesterol of 44, hemoglobin A1c was 7.6, and creatinine was 2.1.  Vascular Doppler study 2019: Final Interpretation:  Right Carotid: Velocities in the right ICA are consistent with a 1-39%  stenosis.   Left Carotid: Velocities in the left ICA are consistent with a 40-59%  stenosis.   Vertebrals: Bilateral vertebral arteries demonstrate antegrade flow.   *See table(s) above for measurements and observations.     EKG:  EKG this EKG is compared to a tracing from 03/04/2019.  The current EKG reveals sinus rhythm with poor R wave progression but essentially normal in appearance.  The tracing, background noise is no longer present.  Recent Labs: 01/02/2020: BUN 37; Creatinine, Ser 2.46; Potassium 4.5; Sodium 140  Recent Lipid Panel    Component Value Date/Time   CHOL 220 (H) 10/14/2017 0742   TRIG 156 (H) 10/14/2017 0742   HDL 39 (L) 10/14/2017  0742   CHOLHDL 5.6 10/14/2017  0742   VLDL 31 10/14/2017 0742   LDLCALC 150 (H) 10/14/2017 0742    Physical Exam:    VS:  BP (!) 146/82   Pulse 67   Ht 5\' 8"  (1.727 m)   Wt 217 lb (98.4 kg)   SpO2 95%   BMI 32.99 kg/m     Wt Readings from Last 3 Encounters:  05/12/20 217 lb (98.4 kg)  11/05/19 (!) 218 lb (98.9 kg)  04/23/19 223 lb 12.8 oz (101.5 kg)     GEN: Obese. No acute distress HEENT: Normal NECK: No JVD. LYMPHATICS: No lymphadenopathy CARDIAC: No murmur. RRR no gallop, or edema. VASCULAR:  Normal Pulses. No bruits. RESPIRATORY:  Clear to auscultation without rales, wheezing or rhonchi  ABDOMEN: Soft, non-tender, non-distended, No pulsatile mass, MUSCULOSKELETAL: No deformity  SKIN: Warm and dry NEUROLOGIC:  Alert and oriented x 3 PSYCHIATRIC:  Normal affect   ASSESSMENT:    1. Coronary artery disease involving native coronary artery of native heart with angina pectoris ([place])   2. Chronic diastolic CHF (congestive heart failure) ([place])   3. Essential hypertension   4. Stage 3b chronic kidney disease ([place])   5. OSA (obstructive sleep apnea)   6. Pure hypercholesterolemia   7. Educated about COVID-19 virus infection   8. Bilateral carotid artery stenosis    PLAN:    In order of problems listed above:  1. Secondary prevention discussed. 2. No evidence of volume overload. 3. Controlled without significant issue.  Slightly elevated today.  Home recordings are perfect. 4. Change in creatinine to closer to to suggest improved kidney function stage IIIb. 5. He is practicing compliance with CPAP. 6. Continue high intensity statin therapy. 7. Vaccinated and practicing social distancing. 8. Bilateral carotid Doppler to follow-up on prior known carotid disease.  Overall education and awareness concerning secondary risk prevention was discussed in detail: LDL less than 70, hemoglobin A1c less than 7, blood pressure target less than 130/80 mmHg, >150 minutes of  moderate aerobic activity per week, avoidance of smoking, weight control (via diet and exercise), and continued surveillance/management of/for obstructive sleep apnea.    Medication Adjustments/Labs and Tests Ordered: Current medicines are reviewed at length with the patient today.  Concerns regarding medicines are outlined above.  Orders Placed This Encounter  Procedures  . EKG 12-Lead  . VAS US CAROTID   No orders of the defined types were placed in this encounter.   Patient Instructions  Medication Instructions:  Your physician recommends that you continue on your current medications as directed. Please refer to the Current Medication list given to you today.  *If you need a refill on your cardiac medications before your next appointment, please call your pharmacy*   Lab Work: None If you have labs (blood work) drawn today and your tests are completely normal, you will receive your results only by: [name] MyChart Message (if you have MyChart) OR . A paper copy in the mail If you have any lab test that is abnormal or we need to change your treatment, we will call you to review the results.   Testing/Procedures: Your physician has requested that you have a carotid duplex. This test is an ultrasound of the carotid arteries in your neck. It looks at blood flow through these arteries that supply the brain with blood. Allow one hour for this exam. There are no restrictions or special instructions.   Follow-Up: At [facility], you and your health needs are our priority.  As part of our  continuing mission to provide you with exceptional heart care, we have created designated Provider Care Teams.  These Care Teams include your primary Cardiologist (physician) and Advanced Practice Providers (APPs -  Physician Assistants and Nurse Practitioners) who all work together to provide you with the care you need, when you need it.  We recommend signing up for the patient portal called "MyChart".   Sign up information is provided on this After Visit Summary.  MyChart is used to connect with patients for Virtual Visits (Telemedicine).  Patients are able to view lab/test results, encounter notes, upcoming appointments, etc.  Non-urgent messages can be sent to your provider as well.   To learn more about what you can do with MyChart, go to [email or web address].    Your next appointment:   1 year(s)  The format for your next appointment:   In Person  Provider:   You may see Sinclair Grooms, MD or one of the following Advanced Practice Providers on your designated Care Team:    Kathyrn Drown, NP    Other Instructions      Signed, Sinclair Grooms, MD  05/12/2020 10:21 AM    [place]

## 2020-05-12 ENCOUNTER — Other Ambulatory Visit: Payer: Self-pay

## 2020-05-12 ENCOUNTER — Encounter: Payer: Self-pay | Admitting: Interventional Cardiology

## 2020-05-12 ENCOUNTER — Ambulatory Visit (INDEPENDENT_AMBULATORY_CARE_PROVIDER_SITE_OTHER): Payer: Medicare Other | Admitting: Interventional Cardiology

## 2020-05-12 VITALS — BP 146/82 | HR 67 | Ht 68.0 in | Wt 217.0 lb

## 2020-05-12 DIAGNOSIS — I10 Essential (primary) hypertension: Secondary | ICD-10-CM

## 2020-05-12 DIAGNOSIS — I5032 Chronic diastolic (congestive) heart failure: Secondary | ICD-10-CM | POA: Diagnosis not present

## 2020-05-12 DIAGNOSIS — Z7189 Other specified counseling: Secondary | ICD-10-CM

## 2020-05-12 DIAGNOSIS — I25119 Atherosclerotic heart disease of native coronary artery with unspecified angina pectoris: Secondary | ICD-10-CM

## 2020-05-12 DIAGNOSIS — N1832 Chronic kidney disease, stage 3b: Secondary | ICD-10-CM

## 2020-05-12 DIAGNOSIS — G4733 Obstructive sleep apnea (adult) (pediatric): Secondary | ICD-10-CM | POA: Diagnosis not present

## 2020-05-12 DIAGNOSIS — I6523 Occlusion and stenosis of bilateral carotid arteries: Secondary | ICD-10-CM

## 2020-05-12 DIAGNOSIS — E7800 Pure hypercholesterolemia, unspecified: Secondary | ICD-10-CM | POA: Diagnosis not present

## 2020-05-12 NOTE — Patient Instructions (Signed)
Medication Instructions:  Your physician recommends that you continue on your current medications as directed. Please refer to the Current Medication list given to you today.  *If you need a refill on your cardiac medications before your next appointment, please call your pharmacy*   Lab Work: None If you have labs (blood work) drawn today and your tests are completely normal, you will receive your results only by: [name] MyChart Message (if you have MyChart) OR . A paper copy in the mail If you have any lab test that is abnormal or we need to change your treatment, we will call you to review the results.   Testing/Procedures: Your physician has requested that you have a carotid duplex. This test is an ultrasound of the carotid arteries in your neck. It looks at blood flow through these arteries that supply the brain with blood. Allow one hour for this exam. There are no restrictions or special instructions.   Follow-Up: At [facility], you and your health needs are our priority.  As part of our continuing mission to provide you with exceptional heart care, we have created designated Provider Care Teams.  These Care Teams include your primary Cardiologist (physician) and Advanced Practice Providers (APPs -  Physician Assistants and Nurse Practitioners) who all work together to provide you with the care you need, when you need it.  We recommend signing up for the patient portal called "MyChart".  Sign up information is provided on this After Visit Summary.  MyChart is used to connect with patients for Virtual Visits (Telemedicine).  Patients are able to view lab/test results, encounter notes, upcoming appointments, etc.  Non-urgent messages can be sent to your provider as well.   To learn more about what you can do with MyChart, go to [email or web address].    Your next appointment:   1 year(s)  The format for your next appointment:   In Person  Provider:   You may see Sinclair Grooms, MD or one of the following Advanced Practice Providers on your designated Care Team:    Kathyrn Drown, NP    Other Instructions

## 2020-05-17 ENCOUNTER — Other Ambulatory Visit: Payer: Self-pay | Admitting: *Deleted

## 2020-05-17 ENCOUNTER — Telehealth: Payer: Self-pay | Admitting: Interventional Cardiology

## 2020-05-17 MED ORDER — DAPAGLIFLOZIN PROPANEDIOL 5 MG PO TABS: 5.0000 mg | ORAL_TABLET | Freq: Every day | ORAL | 11 refills | Status: DC

## 2020-05-17 NOTE — Telephone Encounter (Signed)
Spoke with pt and he states [employer] did not receive the prescription for the [place] yet.  Advised I will get Dr. Tamala Julian to sign a prescription and get that faxed over.  Pt appreciative for assistance.

## 2020-05-17 NOTE — Telephone Encounter (Signed)
New Message:    Pt called and said he would like to talk Dr Thompson Caul nurse asap please. He said he did not want to leave a message or any details. He said the last time he left a message, it got all messed up.

## 2020-05-19 ENCOUNTER — Other Ambulatory Visit: Payer: Self-pay

## 2020-05-19 ENCOUNTER — Other Ambulatory Visit: Payer: Self-pay | Admitting: Interventional Cardiology

## 2020-05-19 ENCOUNTER — Ambulatory Visit (HOSPITAL_COMMUNITY)
Admission: RE | Admit: 2020-05-19 | Discharge: 2020-05-19 | Disposition: A | Discharge status: Home / Self Care | Payer: Medicare Other | Source: Ambulatory Visit | Attending: Cardiovascular Disease | Admitting: Cardiovascular Disease

## 2020-05-19 DIAGNOSIS — I6523 Occlusion and stenosis of bilateral carotid arteries: Secondary | ICD-10-CM | POA: Insufficient documentation

## 2020-05-19 DIAGNOSIS — Z9889 Other specified postprocedural states: Secondary | ICD-10-CM

## 2020-05-25 DIAGNOSIS — M25562 Pain in left knee: Secondary | ICD-10-CM | POA: Diagnosis not present

## 2020-05-25 DIAGNOSIS — E1140 Type 2 diabetes mellitus with diabetic neuropathy, unspecified: Secondary | ICD-10-CM | POA: Diagnosis not present

## 2020-05-25 DIAGNOSIS — I10 Essential (primary) hypertension: Secondary | ICD-10-CM | POA: Diagnosis not present

## 2020-06-04 ENCOUNTER — Ambulatory Visit (INDEPENDENT_AMBULATORY_CARE_PROVIDER_SITE_OTHER): Payer: Medicare Other | Admitting: Podiatry

## 2020-06-04 ENCOUNTER — Other Ambulatory Visit: Payer: Self-pay

## 2020-06-04 ENCOUNTER — Encounter: Payer: Self-pay | Admitting: Podiatry

## 2020-06-04 DIAGNOSIS — B351 Tinea unguium: Secondary | ICD-10-CM

## 2020-06-04 DIAGNOSIS — M79675 Pain in left toe(s): Secondary | ICD-10-CM

## 2020-06-04 DIAGNOSIS — E1142 Type 2 diabetes mellitus with diabetic polyneuropathy: Secondary | ICD-10-CM

## 2020-06-04 DIAGNOSIS — N184 Chronic kidney disease, stage 4 (severe): Secondary | ICD-10-CM

## 2020-06-04 DIAGNOSIS — D689 Coagulation defect, unspecified: Secondary | ICD-10-CM

## 2020-06-04 NOTE — Progress Notes (Addendum)
This patient returns to my office for at risk foot care.  This patient requires this care by a professional since this patient will be at risk due to having CKD, DM  And coagulation defect.  Patient is taking plavix and gabapentin.  Patient says capacian is helpful for his neuropathy.  This patient is unable to cut nails himself since the patient cannot reach his nails.These nails are painful walking and wearing shoes.  This patient presents for at risk foot care today.  General Appearance  Alert, conversant and in no acute stress.  Vascular  Dorsalis pedis  are palpable  bilaterally. Posterior tibial pulses are weakly palpable  B/L.   Capillary return is within normal limits  bilaterally. Temperature is within normal limits  bilaterally.  Neurologic  Senn-Weinstein monofilament wire test absent  bilaterally. Muscle power within normal limits bilaterally.  Nails Thick disfigured discolored nails with subungual debris  Hallux nails  B/L.[name] No evidence of bacterial infection or drainage bilaterally.  Orthopedic  No limitations of motion  feet .  No crepitus or effusions noted.  No bony pathology or digital deformities noted. HAV  B/L.  Midfoot  DJD  B/L.  Skin  normotropic skin with no porokeratosis noted bilaterally.  No signs of infections or ulcers noted.     Onychomycosis  Pain in right toes  Pain in left toes  Consent was obtained for treatment procedures.   Mechanical debridement of nails 1-5  bilaterally performed with a nail nipper.  Filed with dremel without incident. Patient qualifies for diabetic shoes due to DPN, HAV and foot arthritis  B/L. This note and prescription for diabetic shoes faxed.   Return office visit  3 months                   Told patient to return for periodic foot care and evaluation due to potential at risk complications.   Gardiner Barefoot DPM

## 2020-06-15 DIAGNOSIS — M533 Sacrococcygeal disorders, not elsewhere classified: Secondary | ICD-10-CM | POA: Diagnosis not present

## 2020-06-18 DIAGNOSIS — G629 Polyneuropathy, unspecified: Secondary | ICD-10-CM | POA: Diagnosis not present

## 2020-06-18 DIAGNOSIS — E1122 Type 2 diabetes mellitus with diabetic chronic kidney disease: Secondary | ICD-10-CM | POA: Diagnosis not present

## 2020-06-18 DIAGNOSIS — J449 Chronic obstructive pulmonary disease, unspecified: Secondary | ICD-10-CM | POA: Diagnosis not present

## 2020-06-18 DIAGNOSIS — I129 Hypertensive chronic kidney disease with stage 1 through stage 4 chronic kidney disease, or unspecified chronic kidney disease: Secondary | ICD-10-CM | POA: Diagnosis not present

## 2020-06-18 DIAGNOSIS — E875 Hyperkalemia: Secondary | ICD-10-CM | POA: Diagnosis not present

## 2020-06-18 DIAGNOSIS — N184 Chronic kidney disease, stage 4 (severe): Secondary | ICD-10-CM | POA: Diagnosis not present

## 2020-06-18 DIAGNOSIS — I5032 Chronic diastolic (congestive) heart failure: Secondary | ICD-10-CM | POA: Diagnosis not present

## 2020-06-18 DIAGNOSIS — K219 Gastro-esophageal reflux disease without esophagitis: Secondary | ICD-10-CM | POA: Diagnosis not present

## 2020-06-29 DIAGNOSIS — M533 Sacrococcygeal disorders, not elsewhere classified: Secondary | ICD-10-CM | POA: Diagnosis not present

## 2020-06-29 DIAGNOSIS — M7072 Other bursitis of hip, left hip: Secondary | ICD-10-CM | POA: Diagnosis not present

## 2020-07-21 DIAGNOSIS — L821 Other seborrheic keratosis: Secondary | ICD-10-CM | POA: Diagnosis not present

## 2020-07-21 DIAGNOSIS — L570 Actinic keratosis: Secondary | ICD-10-CM | POA: Diagnosis not present

## 2020-07-21 DIAGNOSIS — L304 Erythema intertrigo: Secondary | ICD-10-CM | POA: Diagnosis not present

## 2020-09-01 ENCOUNTER — Ambulatory Visit (INDEPENDENT_AMBULATORY_CARE_PROVIDER_SITE_OTHER): Payer: Medicare Other | Admitting: Podiatry

## 2020-09-01 ENCOUNTER — Other Ambulatory Visit: Payer: Self-pay

## 2020-09-01 ENCOUNTER — Encounter: Payer: Self-pay | Admitting: Podiatry

## 2020-09-01 DIAGNOSIS — Z23 Encounter for immunization: Secondary | ICD-10-CM | POA: Insufficient documentation

## 2020-09-01 DIAGNOSIS — K5900 Constipation, unspecified: Secondary | ICD-10-CM | POA: Insufficient documentation

## 2020-09-01 DIAGNOSIS — G5600 Carpal tunnel syndrome, unspecified upper limb: Secondary | ICD-10-CM | POA: Insufficient documentation

## 2020-09-01 DIAGNOSIS — E538 Deficiency of other specified B group vitamins: Secondary | ICD-10-CM | POA: Insufficient documentation

## 2020-09-01 DIAGNOSIS — B351 Tinea unguium: Secondary | ICD-10-CM

## 2020-09-01 DIAGNOSIS — N200 Calculus of kidney: Secondary | ICD-10-CM | POA: Insufficient documentation

## 2020-09-01 DIAGNOSIS — N184 Chronic kidney disease, stage 4 (severe): Secondary | ICD-10-CM

## 2020-09-01 DIAGNOSIS — Z85828 Personal history of other malignant neoplasm of skin: Secondary | ICD-10-CM | POA: Insufficient documentation

## 2020-09-01 DIAGNOSIS — M25519 Pain in unspecified shoulder: Secondary | ICD-10-CM | POA: Insufficient documentation

## 2020-09-01 DIAGNOSIS — Z79899 Other long term (current) drug therapy: Secondary | ICD-10-CM | POA: Insufficient documentation

## 2020-09-01 DIAGNOSIS — M79675 Pain in left toe(s): Secondary | ICD-10-CM | POA: Diagnosis not present

## 2020-09-01 DIAGNOSIS — M109 Gout, unspecified: Secondary | ICD-10-CM | POA: Insufficient documentation

## 2020-09-01 DIAGNOSIS — H26499 Other secondary cataract, unspecified eye: Secondary | ICD-10-CM | POA: Insufficient documentation

## 2020-09-01 DIAGNOSIS — I259 Chronic ischemic heart disease, unspecified: Secondary | ICD-10-CM | POA: Insufficient documentation

## 2020-09-01 DIAGNOSIS — E1142 Type 2 diabetes mellitus with diabetic polyneuropathy: Secondary | ICD-10-CM | POA: Insufficient documentation

## 2020-09-01 DIAGNOSIS — E875 Hyperkalemia: Secondary | ICD-10-CM | POA: Insufficient documentation

## 2020-09-01 DIAGNOSIS — H53029 Refractive amblyopia, unspecified eye: Secondary | ICD-10-CM | POA: Insufficient documentation

## 2020-09-01 DIAGNOSIS — E119 Type 2 diabetes mellitus without complications: Secondary | ICD-10-CM | POA: Insufficient documentation

## 2020-09-01 DIAGNOSIS — I779 Disorder of arteries and arterioles, unspecified: Secondary | ICD-10-CM | POA: Insufficient documentation

## 2020-09-01 DIAGNOSIS — R1013 Epigastric pain: Secondary | ICD-10-CM | POA: Insufficient documentation

## 2020-09-01 DIAGNOSIS — R001 Bradycardia, unspecified: Secondary | ICD-10-CM | POA: Insufficient documentation

## 2020-09-01 DIAGNOSIS — N400 Enlarged prostate without lower urinary tract symptoms: Secondary | ICD-10-CM | POA: Insufficient documentation

## 2020-09-01 DIAGNOSIS — K219 Gastro-esophageal reflux disease without esophagitis: Secondary | ICD-10-CM | POA: Insufficient documentation

## 2020-09-01 DIAGNOSIS — D126 Benign neoplasm of colon, unspecified: Secondary | ICD-10-CM | POA: Insufficient documentation

## 2020-09-01 DIAGNOSIS — R1032 Left lower quadrant pain: Secondary | ICD-10-CM | POA: Insufficient documentation

## 2020-09-01 DIAGNOSIS — K5792 Diverticulitis of intestine, part unspecified, without perforation or abscess without bleeding: Secondary | ICD-10-CM | POA: Insufficient documentation

## 2020-09-01 DIAGNOSIS — R259 Unspecified abnormal involuntary movements: Secondary | ICD-10-CM | POA: Insufficient documentation

## 2020-09-01 DIAGNOSIS — I69959 Hemiplegia and hemiparesis following unspecified cerebrovascular disease affecting unspecified side: Secondary | ICD-10-CM | POA: Insufficient documentation

## 2020-09-01 DIAGNOSIS — R194 Change in bowel habit: Secondary | ICD-10-CM | POA: Insufficient documentation

## 2020-09-01 DIAGNOSIS — D649 Anemia, unspecified: Secondary | ICD-10-CM | POA: Insufficient documentation

## 2020-09-01 DIAGNOSIS — G629 Polyneuropathy, unspecified: Secondary | ICD-10-CM | POA: Insufficient documentation

## 2020-09-01 DIAGNOSIS — R251 Tremor, unspecified: Secondary | ICD-10-CM | POA: Insufficient documentation

## 2020-09-01 DIAGNOSIS — E113299 Type 2 diabetes mellitus with mild nonproliferative diabetic retinopathy without macular edema, unspecified eye: Secondary | ICD-10-CM | POA: Insufficient documentation

## 2020-09-01 DIAGNOSIS — R972 Elevated prostate specific antigen [PSA]: Secondary | ICD-10-CM | POA: Insufficient documentation

## 2020-09-01 DIAGNOSIS — Z87891 Personal history of nicotine dependence: Secondary | ICD-10-CM | POA: Insufficient documentation

## 2020-09-01 NOTE — Progress Notes (Signed)
This patient returns to my office for at risk foot care.  This patient requires this care by a professional since this patient will be at risk due to having CKD, DM  And coagulation defect.  Patient is taking plavix and gabapentin. He says he has received 2 pair of diabetic shoes from the [place].[name]  This patient is unable to cut nails himself since the patient cannot reach his nails.These nails are painful walking and wearing shoes.  This patient presents for at risk foot care today.  General Appearance  Alert, conversant and in no acute stress.  Vascular  Dorsalis pedis  are palpable  bilaterally. Posterior tibial pulses are weakly palpable  B/L.   Capillary return is within normal limits  bilaterally. Temperature is within normal limits  bilaterally.  Neurologic  Senn-Weinstein monofilament wire test absent  bilaterally. Muscle power within normal limits bilaterally.  Nails Thick disfigured discolored nails with subungual debris  Absent nail plate right hallux.  Nail plate left hallux is deformed and occasionally ingrown.. No evidence of bacterial infection or drainage bilaterally.  Orthopedic  No limitations of motion  feet .  No crepitus or effusions noted.  No bony pathology or digital deformities noted. HAV  B/L.  Midfoot  DJD  B/L.  Skin  normotropic skin with no porokeratosis noted bilaterally.  No signs of infections or ulcers noted.     Onychomycosis  Pain in right toes  Pain in left toes  Consent was obtained for treatment procedures.   Mechanical debridement of nails 2-5  bilaterally performed with a nail nipper.  Filed with dremel without incident.    Return office visit  3 months                   Told patient to return for periodic foot care and evaluation due to potential at risk complications.   Gardiner Barefoot DPM

## 2020-09-14 DIAGNOSIS — J449 Chronic obstructive pulmonary disease, unspecified: Secondary | ICD-10-CM | POA: Diagnosis not present

## 2020-09-14 DIAGNOSIS — R059 Cough, unspecified: Secondary | ICD-10-CM | POA: Diagnosis not present

## 2020-09-29 DIAGNOSIS — C44622 Squamous cell carcinoma of skin of right upper limb, including shoulder: Secondary | ICD-10-CM | POA: Diagnosis not present

## 2020-09-29 DIAGNOSIS — D485 Neoplasm of uncertain behavior of skin: Secondary | ICD-10-CM | POA: Diagnosis not present

## 2020-10-06 DIAGNOSIS — C44622 Squamous cell carcinoma of skin of right upper limb, including shoulder: Secondary | ICD-10-CM | POA: Diagnosis not present

## 2020-11-12 DIAGNOSIS — E7800 Pure hypercholesterolemia, unspecified: Secondary | ICD-10-CM | POA: Diagnosis not present

## 2020-11-12 DIAGNOSIS — I10 Essential (primary) hypertension: Secondary | ICD-10-CM | POA: Diagnosis not present

## 2020-11-12 DIAGNOSIS — N183 Chronic kidney disease, stage 3 unspecified: Secondary | ICD-10-CM | POA: Diagnosis not present

## 2020-11-12 DIAGNOSIS — Z79899 Other long term (current) drug therapy: Secondary | ICD-10-CM | POA: Diagnosis not present

## 2020-11-12 DIAGNOSIS — E1140 Type 2 diabetes mellitus with diabetic neuropathy, unspecified: Secondary | ICD-10-CM | POA: Diagnosis not present

## 2020-11-19 ENCOUNTER — Telehealth: Payer: Self-pay | Admitting: Pharmacist

## 2020-11-19 DIAGNOSIS — J449 Chronic obstructive pulmonary disease, unspecified: Secondary | ICD-10-CM | POA: Diagnosis not present

## 2020-11-19 DIAGNOSIS — E1140 Type 2 diabetes mellitus with diabetic neuropathy, unspecified: Secondary | ICD-10-CM | POA: Diagnosis not present

## 2020-11-19 DIAGNOSIS — S81812A Laceration without foreign body, left lower leg, initial encounter: Secondary | ICD-10-CM | POA: Diagnosis not present

## 2020-11-19 DIAGNOSIS — Z7984 Long term (current) use of oral hypoglycemic drugs: Secondary | ICD-10-CM | POA: Diagnosis not present

## 2020-11-19 DIAGNOSIS — E7800 Pure hypercholesterolemia, unspecified: Secondary | ICD-10-CM | POA: Diagnosis not present

## 2020-11-19 DIAGNOSIS — Z0000 Encounter for general adult medical examination without abnormal findings: Secondary | ICD-10-CM | POA: Diagnosis not present

## 2020-11-19 DIAGNOSIS — Z79899 Other long term (current) drug therapy: Secondary | ICD-10-CM | POA: Diagnosis not present

## 2020-11-19 MED ORDER — DAPAGLIFLOZIN PROPANEDIOL 10 MG PO TABS: 10.0000 mg | ORAL_TABLET | Freq: Every day | ORAL | 3 refills | Status: DC

## 2020-11-19 NOTE — Telephone Encounter (Signed)
Noticed that pt was on Farxiga 5mg  and he has CHF. Spoke with Dr. Tamala Julian who advised to increase to 10mg  since this is the CHF dosing. Pt does also have DM. New rx sent to [facility] which is the pharmacy [place] [facility] [place] uses. Patient made aware of the dose increase.

## 2020-12-03 ENCOUNTER — Telehealth: Payer: Self-pay | Admitting: Interventional Cardiology

## 2020-12-03 NOTE — Telephone Encounter (Signed)
Patient states he has a medical question that he would really like Dr. Tamala Julian to address personally

## 2020-12-03 NOTE — Telephone Encounter (Signed)
Spoke with pt and he asked if we had any doctor's that do TAVR due to a family member needing to get one.  Provided him with the names of our TAVR doctors.  Pt appreciative for assistance.

## 2020-12-07 ENCOUNTER — Ambulatory Visit: Payer: Medicare Other | Admitting: Podiatry

## 2020-12-08 DIAGNOSIS — M79672 Pain in left foot: Secondary | ICD-10-CM | POA: Diagnosis not present

## 2020-12-08 DIAGNOSIS — M79671 Pain in right foot: Secondary | ICD-10-CM | POA: Diagnosis not present

## 2020-12-08 DIAGNOSIS — L603 Nail dystrophy: Secondary | ICD-10-CM | POA: Diagnosis not present

## 2020-12-08 DIAGNOSIS — M25775 Osteophyte, left foot: Secondary | ICD-10-CM | POA: Diagnosis not present

## 2020-12-08 DIAGNOSIS — I739 Peripheral vascular disease, unspecified: Secondary | ICD-10-CM | POA: Diagnosis not present

## 2020-12-08 DIAGNOSIS — E1151 Type 2 diabetes mellitus with diabetic peripheral angiopathy without gangrene: Secondary | ICD-10-CM | POA: Diagnosis not present

## 2020-12-17 ENCOUNTER — Telehealth: Payer: Self-pay | Admitting: Interventional Cardiology

## 2020-12-17 NOTE — Telephone Encounter (Signed)
Spoke with pt and he states that he is trying to get the [place] to add [place] to its list of covered medications so that they will pay for it for any [place] that needs it, including him.  They told him to contact us and have Dr. Tamala Julian write a letter stating why the drug is beneficial to him and why he decided to prescribe it in the first place.  Also want to know manufacturer information that backs why this drug is beneficial.  He mentioned that we changed his dose from 5mg  to 10mg  recently and they need the letter to mention why this was done as well.  He states they would like any articles/information/case studies that we can find that also back the importance of this medication.  Advised I will route to Dr. Tamala Julian to write the letter.  I will try to find articles and studies in regards to the benefits of this medication.  Advised I will be in touch once I have all of the information and he can come by the office to pick up as I do not have a way of emailing it.  Pt appreciative for assistance.

## 2020-12-17 NOTE — Telephone Encounter (Signed)
Pt is calling in regards to getting some information emailed to him about the  medicine Faxiga 10 mg, he needs to know how this medicine will benefit him. So he can get the drug approved by the [facility] Pt states that he needs this info ASAP Pt's e-mail address is [email or web address]

## 2020-12-20 ENCOUNTER — Encounter: Payer: Self-pay | Admitting: *Deleted

## 2020-12-21 NOTE — Telephone Encounter (Signed)
Spoke with pt and made him aware that I have placed the letter and articles up front for him to pick up.  Pt appreciative for assistance.

## 2020-12-24 DIAGNOSIS — E875 Hyperkalemia: Secondary | ICD-10-CM | POA: Diagnosis not present

## 2020-12-24 DIAGNOSIS — R809 Proteinuria, unspecified: Secondary | ICD-10-CM | POA: Diagnosis not present

## 2020-12-24 DIAGNOSIS — G629 Polyneuropathy, unspecified: Secondary | ICD-10-CM | POA: Diagnosis not present

## 2020-12-24 DIAGNOSIS — N184 Chronic kidney disease, stage 4 (severe): Secondary | ICD-10-CM | POA: Diagnosis not present

## 2020-12-24 DIAGNOSIS — I10 Essential (primary) hypertension: Secondary | ICD-10-CM | POA: Diagnosis not present

## 2020-12-24 DIAGNOSIS — I5032 Chronic diastolic (congestive) heart failure: Secondary | ICD-10-CM | POA: Diagnosis not present

## 2020-12-24 DIAGNOSIS — K219 Gastro-esophageal reflux disease without esophagitis: Secondary | ICD-10-CM | POA: Diagnosis not present

## 2020-12-24 DIAGNOSIS — E119 Type 2 diabetes mellitus without complications: Secondary | ICD-10-CM | POA: Diagnosis not present

## 2020-12-29 DIAGNOSIS — M7071 Other bursitis of hip, right hip: Secondary | ICD-10-CM | POA: Diagnosis not present

## 2021-01-11 DIAGNOSIS — M533 Sacrococcygeal disorders, not elsewhere classified: Secondary | ICD-10-CM | POA: Diagnosis not present

## 2021-01-21 ENCOUNTER — Other Ambulatory Visit: Payer: Self-pay

## 2021-01-21 MED ORDER — DAPAGLIFLOZIN PROPANEDIOL 10 MG PO TABS: 10.0000 mg | ORAL_TABLET | Freq: Every day | ORAL | 3 refills | Status: DC

## 2021-01-26 DIAGNOSIS — L820 Inflamed seborrheic keratosis: Secondary | ICD-10-CM | POA: Diagnosis not present

## 2021-01-26 DIAGNOSIS — L300 Nummular dermatitis: Secondary | ICD-10-CM | POA: Diagnosis not present

## 2021-01-26 DIAGNOSIS — C44622 Squamous cell carcinoma of skin of right upper limb, including shoulder: Secondary | ICD-10-CM | POA: Diagnosis not present

## 2021-01-28 ENCOUNTER — Other Ambulatory Visit: Payer: Self-pay | Admitting: Physical Medicine and Rehabilitation

## 2021-01-28 DIAGNOSIS — M7918 Myalgia, other site: Secondary | ICD-10-CM

## 2021-01-28 DIAGNOSIS — R102 Pelvic and perineal pain: Secondary | ICD-10-CM

## 2021-02-02 DIAGNOSIS — L820 Inflamed seborrheic keratosis: Secondary | ICD-10-CM | POA: Diagnosis not present

## 2021-02-12 ENCOUNTER — Ambulatory Visit
Admission: RE | Admit: 2021-02-12 | Discharge: 2021-02-12 | Disposition: A | Discharge status: Home / Self Care | Payer: Medicare Other | Source: Ambulatory Visit | Attending: Physical Medicine and Rehabilitation | Admitting: Physical Medicine and Rehabilitation

## 2021-02-12 DIAGNOSIS — R102 Pelvic and perineal pain: Secondary | ICD-10-CM | POA: Diagnosis not present

## 2021-02-12 DIAGNOSIS — M7918 Myalgia, other site: Secondary | ICD-10-CM

## 2021-02-15 DIAGNOSIS — E1151 Type 2 diabetes mellitus with diabetic peripheral angiopathy without gangrene: Secondary | ICD-10-CM | POA: Diagnosis not present

## 2021-02-15 DIAGNOSIS — I739 Peripheral vascular disease, unspecified: Secondary | ICD-10-CM | POA: Diagnosis not present

## 2021-02-15 DIAGNOSIS — L84 Corns and callosities: Secondary | ICD-10-CM | POA: Diagnosis not present

## 2021-02-15 DIAGNOSIS — L603 Nail dystrophy: Secondary | ICD-10-CM | POA: Diagnosis not present

## 2021-02-16 ENCOUNTER — Other Ambulatory Visit (HOSPITAL_COMMUNITY): Payer: Self-pay | Admitting: Physical Medicine and Rehabilitation

## 2021-02-16 ENCOUNTER — Other Ambulatory Visit: Payer: Self-pay | Admitting: Physical Medicine and Rehabilitation

## 2021-02-16 DIAGNOSIS — R9389 Abnormal findings on diagnostic imaging of other specified body structures: Secondary | ICD-10-CM | POA: Diagnosis not present

## 2021-02-16 DIAGNOSIS — Z125 Encounter for screening for malignant neoplasm of prostate: Secondary | ICD-10-CM | POA: Diagnosis not present

## 2021-02-16 DIAGNOSIS — R935 Abnormal findings on diagnostic imaging of other abdominal regions, including retroperitoneum: Secondary | ICD-10-CM

## 2021-02-21 ENCOUNTER — Other Ambulatory Visit: Payer: Self-pay

## 2021-02-21 ENCOUNTER — Encounter (HOSPITAL_COMMUNITY)
Admission: RE | Admit: 2021-02-21 | Discharge: 2021-02-21 | Disposition: A | Discharge status: Home / Self Care | Payer: Medicare Other | Source: Ambulatory Visit | Attending: Physical Medicine and Rehabilitation | Admitting: Physical Medicine and Rehabilitation

## 2021-02-21 DIAGNOSIS — M533 Sacrococcygeal disorders, not elsewhere classified: Secondary | ICD-10-CM | POA: Diagnosis not present

## 2021-02-21 DIAGNOSIS — R935 Abnormal findings on diagnostic imaging of other abdominal regions, including retroperitoneum: Secondary | ICD-10-CM | POA: Insufficient documentation

## 2021-02-21 MED ORDER — TECHNETIUM TC 99M MEDRONATE IV KIT
21.9000 | PACK | Freq: Once | INTRAVENOUS | Status: AC | PRN
  Administered 2021-02-21: 21.9 via INTRAVENOUS

## 2021-02-22 DIAGNOSIS — M545 Low back pain, unspecified: Secondary | ICD-10-CM | POA: Diagnosis not present

## 2021-03-08 DIAGNOSIS — M79644 Pain in right finger(s): Secondary | ICD-10-CM | POA: Diagnosis not present

## 2021-05-17 ENCOUNTER — Other Ambulatory Visit (HOSPITAL_COMMUNITY): Payer: Self-pay | Admitting: Interventional Cardiology

## 2021-05-17 DIAGNOSIS — Z9889 Other specified postprocedural states: Secondary | ICD-10-CM

## 2021-05-18 DIAGNOSIS — L603 Nail dystrophy: Secondary | ICD-10-CM | POA: Diagnosis not present

## 2021-05-18 DIAGNOSIS — L600 Ingrowing nail: Secondary | ICD-10-CM | POA: Diagnosis not present

## 2021-05-18 DIAGNOSIS — I739 Peripheral vascular disease, unspecified: Secondary | ICD-10-CM | POA: Diagnosis not present

## 2021-05-18 DIAGNOSIS — E1151 Type 2 diabetes mellitus with diabetic peripheral angiopathy without gangrene: Secondary | ICD-10-CM | POA: Diagnosis not present

## 2021-05-20 ENCOUNTER — Other Ambulatory Visit: Payer: Self-pay

## 2021-05-20 ENCOUNTER — Ambulatory Visit (HOSPITAL_COMMUNITY)
Admission: RE | Admit: 2021-05-20 | Discharge: 2021-05-20 | Disposition: A | Discharge status: Home / Self Care | Payer: Medicare (Managed Care) | Source: Ambulatory Visit | Attending: Internal Medicine | Admitting: Internal Medicine

## 2021-05-20 DIAGNOSIS — I10 Essential (primary) hypertension: Secondary | ICD-10-CM | POA: Insufficient documentation

## 2021-05-20 DIAGNOSIS — E119 Type 2 diabetes mellitus without complications: Secondary | ICD-10-CM | POA: Diagnosis not present

## 2021-05-20 DIAGNOSIS — I2510 Atherosclerotic heart disease of native coronary artery without angina pectoris: Secondary | ICD-10-CM | POA: Insufficient documentation

## 2021-05-20 DIAGNOSIS — Z9889 Other specified postprocedural states: Secondary | ICD-10-CM | POA: Diagnosis not present

## 2021-05-20 DIAGNOSIS — E785 Hyperlipidemia, unspecified: Secondary | ICD-10-CM | POA: Diagnosis not present

## 2021-05-20 DIAGNOSIS — I6521 Occlusion and stenosis of right carotid artery: Secondary | ICD-10-CM | POA: Diagnosis not present

## 2021-05-20 DIAGNOSIS — Z87891 Personal history of nicotine dependence: Secondary | ICD-10-CM | POA: Insufficient documentation

## 2021-05-31 ENCOUNTER — Telehealth: Payer: Self-pay | Admitting: Interventional Cardiology

## 2021-05-31 ENCOUNTER — Encounter: Payer: Self-pay | Admitting: Interventional Cardiology

## 2021-05-31 MED ORDER — DAPAGLIFLOZIN PROPANEDIOL 10 MG PO TABS: 10.0000 mg | ORAL_TABLET | Freq: Every day | ORAL | 0 refills | Status: DC

## 2021-05-31 MED ORDER — DAPAGLIFLOZIN PROPANEDIOL 10 MG PO TABS: 10.0000 mg | ORAL_TABLET | Freq: Every day | ORAL | 3 refills | Status: DC

## 2021-05-31 NOTE — Telephone Encounter (Signed)
Pt's medication was sent to pt's pharmacy as requested. Confirmation received.  °

## 2021-05-31 NOTE — Telephone Encounter (Signed)
Patient states he has been approved for another year with [employer] and they need a prescription.  *STAT* If patient is at the pharmacy, call can be transferred to refill team.   1. Which medications need to be refilled? (please list name of each medication and dose if known)  dapagliflozin propanediol (FARXIGA) 10 MG TABS tablet  2. Which pharmacy/location (including street and city if local pharmacy) is medication to be sent to? [place] Phone#: [phone] Fax#: [phone]  0. Do they need a 30 day or 90 day supply?  90 day supply

## 2021-06-03 ENCOUNTER — Telehealth: Payer: Self-pay

## 2021-06-03 NOTE — Telephone Encounter (Signed)
**Note De-Identified  Obfuscation** We received a [employer] form from [place] and [place]. I have completed the form and have emailed it to Dr [employer] nurse so she can obtaion his signature and fax to [facility] and [place] at the fax number written on the cover letter included.

## 2021-06-03 NOTE — Telephone Encounter (Signed)
Paperwork completed and faxed.  °

## 2021-06-08 ENCOUNTER — Other Ambulatory Visit: Payer: Self-pay

## 2021-06-08 ENCOUNTER — Encounter: Payer: Self-pay | Admitting: Interventional Cardiology

## 2021-06-08 ENCOUNTER — Ambulatory Visit: Payer: Medicare (Managed Care) | Admitting: Interventional Cardiology

## 2021-06-08 VITALS — BP 148/78 | HR 66 | Ht 68.0 in | Wt 213.6 lb

## 2021-06-08 DIAGNOSIS — I6523 Occlusion and stenosis of bilateral carotid arteries: Secondary | ICD-10-CM | POA: Diagnosis not present

## 2021-06-08 DIAGNOSIS — I10 Essential (primary) hypertension: Secondary | ICD-10-CM | POA: Diagnosis not present

## 2021-06-08 DIAGNOSIS — I5032 Chronic diastolic (congestive) heart failure: Secondary | ICD-10-CM | POA: Diagnosis not present

## 2021-06-08 DIAGNOSIS — I25119 Atherosclerotic heart disease of native coronary artery with unspecified angina pectoris: Secondary | ICD-10-CM

## 2021-06-08 DIAGNOSIS — E118 Type 2 diabetes mellitus with unspecified complications: Secondary | ICD-10-CM | POA: Diagnosis not present

## 2021-06-08 DIAGNOSIS — G4733 Obstructive sleep apnea (adult) (pediatric): Secondary | ICD-10-CM | POA: Diagnosis not present

## 2021-06-08 DIAGNOSIS — N1832 Chronic kidney disease, stage 3b: Secondary | ICD-10-CM | POA: Diagnosis not present

## 2021-06-08 NOTE — Progress Notes (Signed)
Cardiology Office Note:    Date:  06/08/2021   ID:  Marco Acevedo, DOB [birth year], MRN [id number]  PCP:  Lawerance Cruel, MD  Cardiologist:  Sinclair Grooms, MD   Referring MD: Lawerance Cruel, MD   Chief Complaint  Patient presents with   Cardiac Valve Problem    History of Present Illness:    Marco Acevedo is a 87 y.o. male with a hx of CAD and prior circumflex stents, Chronic DHF, hyperlipidemia, hypertension, obesity and OSA.  History of CVA October 14, 2017 related to, left brain atheroembolic event from the left carotid.  Underwent left carotid endarterectomy on 10/19/2017.  Cardiac cath November 2020 for progressive chest pain with stable anatomy.   Carafate resolved the chest discomfort as it did in 2019.  He denies angina.  He does have dyspnea on exertion and shortness of breath when he bends over.  He plays golf twice a week.  He does not have chest pain.  Denies nitroglycerin use.  Denies orthopnea lower extremity swelling.  Medications were readjusted by his primary physician, Dr. Venora Maples at the [facility] in [place].  Diuretic therapy with furosemide was discontinued.  No changes in his overall condition based upon his symptomatology compared to the last visit.    Past Medical History:  Diagnosis Date   Anemia    low iron   Arthritis    BPH (benign prostatic hyperplasia)    Bruises easily    CAD (coronary artery disease)    a. per Dr. Thompson Caul note, prior stenting of Cx in 2007 and 2012. b. DES to Cx in 09/2012.   Chronic diastolic CHF (congestive heart failure) (HCC)    CKD (chronic kidney disease), stage [name] (HCC)    Claustrophobia    Complication of anesthesia    has to have head elevated to keep from strangling on post nasal drip   COPD (chronic obstructive pulmonary disease) ([place])    Diabetes mellitus type II    type 2   Diarrhea 2015   had for a year and a half   Dyspnea    GERD (gastroesophageal reflux disease)    Gout    Granuloma  annulare    History of hiatal hernia    History of kidney stones    Litthotrispy   Hyperlipidemia    Hypertension    Neuropathy    Obesity    OSA (obstructive sleep apnea)    Right sided weakness    Stroke ([place]) 10/2017   Weakness right arm and leg   Wears dentures    top   Wears glasses     Past Surgical History:  Procedure Laterality Date   BLEPHAROPLASTY     CARDIAC CATHETERIZATION  0109,3235   X 2 stents   CHOLECYSTECTOMY N/A 09/23/2014   Procedure: LAPAROSCOPIC CHOLECYSTECTOMY WITH INTRAOPERATIVE CHOLANGIOGRAM;  Surgeon: Autumn Messing [name], MD;  Location: [place];  Service: General;  Laterality: N/A;   COLONOSCOPY     COLONOSCOPY WITH PROPOFOL N/A 03/22/2016   Procedure: COLONOSCOPY WITH PROPOFOL;  Surgeon: Wilford Corner, MD;  Location: [facility] ENDOSCOPY;  Service: Endoscopy;  Laterality: N/A;   ENDARTERECTOMY Left 10/19/2017   Procedure: ENDARTERECTOMY CAROTID LEFT;  Surgeon: [name], MD;  Location: [facility] OR;  Service: Vascular;  Laterality: Left;   ESOPHAGOGASTRODUODENOSCOPY (EGD) WITH PROPOFOL N/A 03/22/2016   Procedure: ESOPHAGOGASTRODUODENOSCOPY (EGD) WITH PROPOFOL;  Surgeon: Wilford Corner, MD;  Location: [facility] ENDOSCOPY;  Service: Endoscopy;  Laterality: N/A;   EYE  SURGERY     both cataracts   KNEE ARTHROSCOPY WITH MEDIAL MENISECTOMY Right 08/19/2013   Procedure: RIGHT KNEE ARTHROSCOPY WITH PARTIAL MEDIAL MENISECTOMY AND CHONDROPLASTY;  Surgeon: Hessie Dibble, MD;  Location: [place];  Service: Orthopedics;  Laterality: Right;   LEFT HEART CATH AND CORONARY ANGIOGRAPHY N/A 06/28/2016   Procedure: Left Heart Cath and Coronary Angiography;  Surgeon: Belva Crome, MD;  Location: [place] CV LAB;  Service: Cardiovascular;  Laterality: N/A;   LEFT HEART CATH AND CORONARY ANGIOGRAPHY N/A 02/25/2019   Procedure: LEFT HEART CATH AND CORONARY ANGIOGRAPHY;  Surgeon: Belva Crome, MD;  [facility];  Service: Cardiovascular;  Laterality: N/A;    LEFT HEART CATHETERIZATION WITH CORONARY ANGIOGRAM N/A 10/03/2011   Procedure: LEFT HEART CATHETERIZATION WITH CORONARY ANGIOGRAM;  Surgeon: Sinclair Grooms, MD;  Location: [facility] CATH LAB;  Service: Cardiovascular;  Laterality: N/A;   LEFT HEART CATHETERIZATION WITH CORONARY ANGIOGRAM N/A 09/12/2012   Procedure: LEFT HEART CATHETERIZATION WITH CORONARY ANGIOGRAM;  Surgeon: Sinclair Grooms, MD;  Location: [facility] CATH LAB;  Service: Cardiovascular;  Laterality: N/A;   LIPOMA EXCISION     Biospy only; left parotid gland   LITHOTRIPSY     none     PERCUTANEOUS CORONARY STENT INTERVENTION (PCI-S) N/A 09/17/2012   Procedure: PERCUTANEOUS CORONARY STENT INTERVENTION (PCI-S);  Surgeon: Sinclair Grooms, MD;  Location: [facility] CATH LAB;  Service: Cardiovascular;  Laterality: N/A;   TRIGGER FINGER RELEASE Left 12/21/2015   Procedure: LEFT LONG FINGER TRIGGER RELEASE ;  Surgeon: Milly Jakob, MD;  Location: [place];  Service: Orthopedics;  Laterality: Left;    Current Medications: Current Meds  Medication Sig   albuterol (PROVENTIL) (2.5 MG/3ML) 0.083% nebulizer solution Take 2.5 mg by nebulization every 6 (six) hours as needed for wheezing or shortness of breath.    albuterol (VENTOLIN HFA) 108 (90 Base) MCG/ACT inhaler Inhale 2 puffs into the lungs every 6 (six) hours as needed for wheezing or shortness of breath.    allopurinol (ZYLOPRIM) 100 MG tablet Take 100 mg by mouth daily.   amLODipine (NORVASC) 5 MG tablet Take 5 mg by mouth daily.   aspirin EC 81 MG tablet Take 1 tablet (81 mg total) by mouth daily.   atorvastatin (LIPITOR) 80 MG tablet Take 1 tablet (80 mg total) by mouth daily at 6 PM.   carvedilol (COREG) 3.125 MG tablet Take 3.125 mg by mouth 2 (two) times daily with a meal.   cholecalciferol (VITAMIN D3) 25 MCG (1000 UT) tablet Take 1,000 Units by mouth daily.   clopidogrel (PLAVIX) 75 MG tablet Take 1 tablet (75 mg total) by mouth daily.   dapagliflozin propanediol (FARXIGA)  10 MG TABS tablet Take 1 tablet (10 mg total) by mouth daily before breakfast.   Ferrous Sulfate (IRON) 325 (65 FE) MG TABS Take 325 mg by mouth daily.    finasteride (PROSCAR) 5 MG tablet Take 1 tablet (5 mg total) by mouth daily.   furosemide (LASIX) 20 MG tablet Take 20 mg by mouth every other day.   gabapentin (NEURONTIN) 300 MG capsule Take 1 capsule (300 mg total) by mouth at bedtime.   glipiZIDE (GLUCOTROL) 5 MG tablet Take 5 mg by mouth 2 (two) times daily before a meal.    glyBURIDE (DIABETA) 2.5 MG tablet Take 1 tablet by mouth 2 (two) times daily.   guaifenesin (HUMIBID E) 400 MG TABS tablet Take 400 mg by mouth  at bedtime.   isosorbide mononitrate (IMDUR) 120 MG 24 hr tablet Take 1 tablet (120 mg total) by mouth daily.   ketorolac (TORADOL) 60 MG/2ML SOLN injection Inject 60 mg into the muscle once.   nitroGLYCERIN (NITROSTAT) 0.4 MG SL tablet Place 0.4 mg under the tongue every 5 (five) minutes as needed for chest pain.   pantoprazole (PROTONIX) 40 MG tablet Take 40 mg by mouth daily.   primidone (MYSOLINE) 50 MG tablet Take 50 mg by mouth at bedtime.    sucralfate (CARAFATE) 1 GM/10ML suspension Take 1 g by mouth 4 (four) times daily.   traMADol (ULTRAM) 50 MG tablet Take 1 tablet (50 mg total) by mouth at bedtime as needed for moderate pain.   vitamin B-12 (CYANOCOBALAMIN) 1000 MCG tablet Take 1,000 mcg by mouth daily.     Allergies:   Cimetidine and Metformin hcl   Social History   Socioeconomic History   Marital status: Married    Spouse name: Not on file   Number of children: Not on file   Years of education: Not on file   Highest education level: Not on file  Occupational History   Occupation: retired  Tobacco Use   Smoking status: Former    Packs/day: 1.00    Years: 64.00    Pack years: 64.00    Types: Cigarettes    Quit date: 05/29/2011    Years since quitting: 10.0   Smokeless tobacco: Never  Vaping Use   Vaping Use: Never used  Substance and Sexual  Activity   Alcohol use: Yes    Comment: rarely   Drug use: No   Sexual activity: Not Currently  Other Topics Concern   Not on file  Social History Narrative   Denies Caffeine use    Social Determinants of Health   Financial Resource Strain: Not on file  Food Insecurity: Not on file  Transportation Needs: Not on file  Physical Activity: Not on file  Stress: Not on file  Social Connections: Not on file     Family History: The patient's family history includes Aneurysm in his mother; Aortic aneurysm in his father; COPD in his sister; Cancer in his brother; Other in his brother, sister, and sister.  ROS:   Please see the history of present illness.    Shortness of breath is his only concern.  It is a variable threshold.  All other systems reviewed and are negative.  EKGs/Labs/Other Studies Reviewed:    The following studies were reviewed today: No new imaging data  EKG:  EKG normal sinus rhythm, PR interval 192 ms, normal EKG appearance.  In comparison to May 12, 2020, no significant changes noted.  Recent Labs: No results found for requested labs within last 8760 hours.  Recent Lipid Panel    Component Value Date/Time   CHOL 220 (H) 10/14/2017 0742   TRIG 156 (H) 10/14/2017 0742   HDL 39 (L) 10/14/2017 0742   CHOLHDL 5.6 10/14/2017 0742   VLDL 31 10/14/2017 0742   LDLCALC 150 (H) 10/14/2017 0742    Physical Exam:    VS:  BP (!) 148/78    Pulse 66    Ht 5\' 8"  (1.727 m)    Wt 213 lb 9.6 oz (96.9 kg)    SpO2 98%    BMI 32.48 kg/m     Wt Readings from Last 3 Encounters:  06/08/21 213 lb 9.6 oz (96.9 kg)  05/12/20 217 lb (98.4 kg)  11/05/19 (!) 218 lb (98.9 kg)  GEN: Elderly and obese. No acute distress HEENT: Normal NECK: No JVD. LYMPHATICS: No lymphadenopathy CARDIAC: 2/6 left midsternal systolic murmur. RRR S4 but no S3 gallop, or edema. VASCULAR:  Normal Pulses. No bruits. RESPIRATORY:  Clear to auscultation without rales, wheezing or rhonchi   ABDOMEN: Soft, non-tender, non-distended, No pulsatile mass, MUSCULOSKELETAL: No deformity  SKIN: Warm and dry NEUROLOGIC:  Alert and oriented x 3 PSYCHIATRIC:  Normal affect   ASSESSMENT:    1. Coronary artery disease involving native coronary artery of native heart with angina pectoris ([place])   2. Chronic diastolic CHF (congestive heart failure) ([place])   3. Bilateral carotid artery stenosis   4. Stage 3b chronic kidney disease ([place])   5. Essential hypertension   6. OSA (obstructive sleep apnea)   7. Type 2 diabetes mellitus with complication, without long-term current use of insulin (HCC)    PLAN:    In order of problems listed above:  Continue secondary prevention as outlined below.  Discussed with the patient. Continue Farxiga.  Not on diuretic therapy.  Actual diuretic therapy may help improve dyspnea but there is concerned about aggravating kidney numbers. Continue secondary prevention.  Continue aspirin and Plavix. Continue Farxiga therapy.  Continue follow-up with Dr. Johnney Ou at [facility]. Continue current therapy that includes carvedilol, amlodipine, and Imdur. Continue using CPAP. Continue Farxiga.  Guideline directed therapy for left ventricular systolic dysfunction: Angiotensin receptor-neprilysin inhibitor (ARNI)-Entresto; beta-blocker therapy - carvedilol, metoprolol succinate, or bisoprolol; mineralocorticoid receptor antagonist (MRA) therapy -spironolactone or eplerenone.  SGLT-2 agents -  Dapagliflozin [place]) or Empagliflozin ([place]).These therapies have been shown to improve clinical outcomes including reduction of rehospitalization, survival, and acute heart failure.    Medication Adjustments/Labs and Tests Ordered: Current medicines are reviewed at length with the patient today.  Concerns regarding medicines are outlined above.  Orders Placed This Encounter  Procedures   EKG 12-Lead   No orders of the defined types were placed in this  encounter.   Patient Instructions  Medication Instructions:  Your physician recommends that you continue on your current medications as directed. Please refer to the Current Medication list given to you today.  *If you need a refill on your cardiac medications before your next appointment, please call your pharmacy*   Lab Work: None If you have labs (blood work) drawn today and your tests are completely normal, you will receive your results only by: [place] (if you have MyChart) OR A paper copy in the mail If you have any lab test that is abnormal or we need to change your treatment, we will call you to review the results.   Testing/Procedures: None   Follow-Up: At [facility], you and your health needs are our priority.  As part of our continuing mission to provide you with exceptional heart care, we have created designated Provider Care Teams.  These Care Teams include your primary Cardiologist (physician) and Advanced Practice Providers (APPs -  Physician Assistants and Nurse Practitioners) who all work together to provide you with the care you need, when you need it.  We recommend signing up for the patient portal called "MyChart".  Sign up information is provided on this After Visit Summary.  MyChart is used to connect with patients for Virtual Visits (Telemedicine).  Patients are able to view lab/test results, encounter notes, upcoming appointments, etc.  Non-urgent messages can be sent to your provider as well.   To learn more about what you can do with MyChart, go to [email or web address].  Your next appointment:   6-8 month(s)  The format for your next appointment:   In Person  Provider:   Sinclair Grooms, MD     Other Instructions     Signed, Sinclair Grooms, MD  06/08/2021 4:19 PM    [place]

## 2021-06-08 NOTE — Patient Instructions (Signed)
Medication Instructions:  ?Your physician recommends that you continue on your current medications as directed. Please refer to the Current Medication list given to you today. ? ?*If you need a refill on your cardiac medications before your next appointment, please call your pharmacy* ? ? ?Lab Work: ?None ?If you have labs (blood work) drawn today and your tests are completely normal, you will receive your results only by: ?MyChart Message (if you have MyChart) OR ?A paper copy in the mail ?If you have any lab test that is abnormal or we need to change your treatment, we will call you to review the results. ? ? ?Testing/Procedures: ?None ? ? ?Follow-Up: ?At [facility], you and your health needs are our priority.  As part of our continuing mission to provide you with exceptional heart care, we have created designated Provider Care Teams.  These Care Teams include your primary Cardiologist (physician) and Advanced Practice Providers (APPs -  Physician Assistants and Nurse Practitioners) who all work together to provide you with the care you need, when you need it. ? ?We recommend signing up for the patient portal called "MyChart".  Sign up information is provided on this After Visit Summary.  MyChart is used to connect with patients for Virtual Visits (Telemedicine).  Patients are able to view lab/test results, encounter notes, upcoming appointments, etc.  Non-urgent messages can be sent to your provider as well.   ?To learn more about what you can do with MyChart, go to [email or web address].   ? ?Your next appointment:   ?6-8 month(s) ? ?The format for your next appointment:   ?In Person ? ?Provider:   ?Sinclair Grooms, MD   ? ? ?Other Instructions ?  ?

## 2021-06-09 ENCOUNTER — Telehealth: Payer: Self-pay

## 2021-06-09 NOTE — Telephone Encounter (Signed)
**Note De-identified  Obfuscation** -----  **Note De-Identified  Obfuscation** Message from Loren Racer, RN sent at 06/08/2021  4:07 PM EST ----- ?Marco Acevedo, ? ?This pt was in today and mentioned that he hadn't heard anything from the [place] pt assistance program yet.  This is the one I sent the prescription on the other day.  I told him I would touch base with you to see if you had heard anything or knew if we needed to do anything else to make sure he gets his medicine? ? ?Thank you! ? ?Anderson [place] ? ?

## 2021-06-09 NOTE — Telephone Encounter (Signed)
**Note De-Identified  Obfuscation** I called [place] and [place] and was advised by Kennyth Lose that they did receive the pts Farxiga 10 mg #90 with 3 refills RX from [place] on 2/24 and that it was sent to processing on 2/27. ?Per Kennyth Lose it will take 10 to 14 days for processing and that the office will receive a fax letting us know when it ships and they will advise the pt. Of the tracking number at that time. ? ?I have made the pt aware of this and answered some questions he had concerning his office visit with Dr Tamala Julian yesterday which where: ?Do I have CHF?  ?I advised him that he does and explained what CHF is, how long he has had it, and the medications he takes for it. ?2. How did my EKG look yesterday? ?I advised him of the following per EKG results at yesterdays office visit with Dr Tamala Julian: ?EKG normal sinus rhythm, PR interval 192 ms, normal EKG appearance.  In comparison to May 12, 2020, no significant changes noted. ? ?The pt thanked me multiple times for calling him with an update on his [place]ade and for answering his questions. ?

## 2021-06-15 ENCOUNTER — Encounter: Payer: Self-pay | Admitting: Interventional Cardiology

## 2021-06-29 DIAGNOSIS — E875 Hyperkalemia: Secondary | ICD-10-CM | POA: Diagnosis not present

## 2021-06-29 DIAGNOSIS — E1122 Type 2 diabetes mellitus with diabetic chronic kidney disease: Secondary | ICD-10-CM | POA: Diagnosis not present

## 2021-06-29 DIAGNOSIS — G629 Polyneuropathy, unspecified: Secondary | ICD-10-CM | POA: Diagnosis not present

## 2021-06-29 DIAGNOSIS — N184 Chronic kidney disease, stage 4 (severe): Secondary | ICD-10-CM | POA: Diagnosis not present

## 2021-06-29 DIAGNOSIS — R809 Proteinuria, unspecified: Secondary | ICD-10-CM | POA: Diagnosis not present

## 2021-06-29 DIAGNOSIS — I129 Hypertensive chronic kidney disease with stage 1 through stage 4 chronic kidney disease, or unspecified chronic kidney disease: Secondary | ICD-10-CM | POA: Diagnosis not present

## 2021-06-29 DIAGNOSIS — K219 Gastro-esophageal reflux disease without esophagitis: Secondary | ICD-10-CM | POA: Diagnosis not present

## 2021-06-29 DIAGNOSIS — I5032 Chronic diastolic (congestive) heart failure: Secondary | ICD-10-CM | POA: Diagnosis not present

## 2021-06-30 ENCOUNTER — Other Ambulatory Visit (HOSPITAL_COMMUNITY): Payer: Self-pay | Admitting: Interventional Cardiology

## 2021-06-30 DIAGNOSIS — Z9889 Other specified postprocedural states: Secondary | ICD-10-CM

## 2021-07-05 DIAGNOSIS — R233 Spontaneous ecchymoses: Secondary | ICD-10-CM | POA: Diagnosis not present

## 2021-07-05 DIAGNOSIS — S51011A Laceration without foreign body of right elbow, initial encounter: Secondary | ICD-10-CM | POA: Diagnosis not present

## 2021-07-05 DIAGNOSIS — K5792 Diverticulitis of intestine, part unspecified, without perforation or abscess without bleeding: Secondary | ICD-10-CM | POA: Diagnosis not present

## 2021-08-16 DIAGNOSIS — I739 Peripheral vascular disease, unspecified: Secondary | ICD-10-CM | POA: Diagnosis not present

## 2021-08-16 DIAGNOSIS — E1151 Type 2 diabetes mellitus with diabetic peripheral angiopathy without gangrene: Secondary | ICD-10-CM | POA: Diagnosis not present

## 2021-08-16 DIAGNOSIS — L603 Nail dystrophy: Secondary | ICD-10-CM | POA: Diagnosis not present

## 2021-11-29 DIAGNOSIS — M47816 Spondylosis without myelopathy or radiculopathy, lumbar region: Secondary | ICD-10-CM | POA: Diagnosis not present

## 2021-11-30 DIAGNOSIS — Z79899 Other long term (current) drug therapy: Secondary | ICD-10-CM | POA: Diagnosis not present

## 2021-11-30 DIAGNOSIS — E7800 Pure hypercholesterolemia, unspecified: Secondary | ICD-10-CM | POA: Diagnosis not present

## 2021-12-07 DIAGNOSIS — K7689 Other specified diseases of liver: Secondary | ICD-10-CM | POA: Diagnosis not present

## 2021-12-07 DIAGNOSIS — Z0000 Encounter for general adult medical examination without abnormal findings: Secondary | ICD-10-CM | POA: Diagnosis not present

## 2021-12-07 DIAGNOSIS — E1140 Type 2 diabetes mellitus with diabetic neuropathy, unspecified: Secondary | ICD-10-CM | POA: Diagnosis not present

## 2021-12-07 DIAGNOSIS — J449 Chronic obstructive pulmonary disease, unspecified: Secondary | ICD-10-CM | POA: Diagnosis not present

## 2021-12-07 DIAGNOSIS — Z6831 Body mass index (BMI) 31.0-31.9, adult: Secondary | ICD-10-CM | POA: Diagnosis not present

## 2021-12-19 DIAGNOSIS — J441 Chronic obstructive pulmonary disease with (acute) exacerbation: Secondary | ICD-10-CM | POA: Diagnosis not present

## 2021-12-19 DIAGNOSIS — R059 Cough, unspecified: Secondary | ICD-10-CM | POA: Diagnosis not present

## 2022-01-04 DIAGNOSIS — Z23 Encounter for immunization: Secondary | ICD-10-CM | POA: Diagnosis not present

## 2022-01-04 DIAGNOSIS — K7689 Other specified diseases of liver: Secondary | ICD-10-CM | POA: Diagnosis not present

## 2022-01-10 DIAGNOSIS — M47816 Spondylosis without myelopathy or radiculopathy, lumbar region: Secondary | ICD-10-CM | POA: Diagnosis not present

## 2022-01-13 ENCOUNTER — Telehealth: Payer: Self-pay | Admitting: Interventional Cardiology

## 2022-01-13 NOTE — Telephone Encounter (Signed)
Returned call to patient, he states issue was resolved. He was able to get an appt with Dr. Tamala Acevedo on 01/16/22, he wanted to see him for his follow-up before Dr. Francee Piccolo.  Patient expressed appreciation for follow-up.

## 2022-01-13 NOTE — Telephone Encounter (Signed)
Pt would like a callback from Dr. Thompson Caul nurse. Please advise

## 2022-01-15 NOTE — Progress Notes (Unsigned)
Cardiology Office Note:    Date:  01/15/2022   ID:  Marco Acevedo, DOB 1934/06/12, MRN [id number]  PCP:  Lawerance Cruel, MD  Cardiologist:  Sinclair Grooms, MD   Referring MD: Lawerance Cruel, MD   No chief complaint on file.   History of Present Illness:    Marco Acevedo is a 87 y.o. male with a hx of  CAD and prior circumflex stents, Chronic DHF, hyperlipidemia, hypertension, obesity and OSA.  History of CVA October 14, 2017 related to, left brain atheroembolic event from the left carotid.  Underwent left carotid endarterectomy on 10/19/2017.  Cardiac cath November 2020 for progressive chest pain with stable anatomy.   He is always short of breath but states that it has not changed over the past year or so.  He still plays golf twice per week.  He is not having angina.  He does have some lower extremity swelling but no orthopnea.  Past Medical History:  Diagnosis Date   Anemia    low iron   Arthritis    BPH (benign prostatic hyperplasia)    Bruises easily    CAD (coronary artery disease)    a. per Dr. Thompson Caul note, prior stenting of Cx in 2007 and 2012. b. DES to Cx in 09/2012.   Chronic diastolic CHF (congestive heart failure) (HCC)    CKD (chronic kidney disease), stage [name] (HCC)    Claustrophobia    Complication of anesthesia    has to have head elevated to keep from strangling on post nasal drip   COPD (chronic obstructive pulmonary disease) ([place])    Diabetes mellitus type II    type 2   Diarrhea 2015   had for a year and a half   Dyspnea    GERD (gastroesophageal reflux disease)    Gout    Granuloma annulare    History of hiatal hernia    History of kidney stones    Litthotrispy   Hyperlipidemia    Hypertension    Neuropathy    Obesity    OSA (obstructive sleep apnea)    Right sided weakness    Stroke ([place]) 10/2017   Weakness right arm and leg   Wears dentures    top   Wears glasses     Past Surgical History:  Procedure Laterality Date    BLEPHAROPLASTY     CARDIAC CATHETERIZATION  [id number],7846   X 2 stents   CHOLECYSTECTOMY N/A 09/23/2014   Procedure: LAPAROSCOPIC CHOLECYSTECTOMY WITH INTRAOPERATIVE CHOLANGIOGRAM;  Surgeon: Autumn Messing [name], MD;  Location: [facility];  Service: General;  Laterality: N/A;   COLONOSCOPY     COLONOSCOPY WITH PROPOFOL N/A 03/22/2016   Procedure: COLONOSCOPY WITH PROPOFOL;  Surgeon: Wilford Corner, MD;  Location: [facility] ENDOSCOPY;  Service: Endoscopy;  Laterality: N/A;   ENDARTERECTOMY Left 10/19/2017   Procedure: ENDARTERECTOMY CAROTID LEFT;  Surgeon: [name], MD;  Location: [facility] OR;  Service: Vascular;  Laterality: Left;   ESOPHAGOGASTRODUODENOSCOPY (EGD) WITH PROPOFOL N/A 03/22/2016   Procedure: ESOPHAGOGASTRODUODENOSCOPY (EGD) WITH PROPOFOL;  Surgeon: Wilford Corner, MD;  Location: [facility] ENDOSCOPY;  Service: Endoscopy;  Laterality: N/A;   EYE SURGERY     both cataracts   KNEE ARTHROSCOPY WITH MEDIAL MENISECTOMY Right 08/19/2013   Procedure: RIGHT KNEE ARTHROSCOPY WITH PARTIAL MEDIAL MENISECTOMY AND CHONDROPLASTY;  Surgeon: Hessie Dibble, MD;  Location: [facility];  Service: Orthopedics;  Laterality: Right;   LEFT HEART CATH AND CORONARY ANGIOGRAPHY N/A 06/28/2016  Procedure: Left Heart Cath and Coronary Angiography;  Surgeon: Belva Crome, MD;  Location: [facility] CV LAB;  Service: Cardiovascular;  Laterality: N/A;   LEFT HEART CATH AND CORONARY ANGIOGRAPHY N/A 02/25/2019   Procedure: LEFT HEART CATH AND CORONARY ANGIOGRAPHY;  Surgeon: Belva Crome, MD;  Location: [facility] CV LAB;  Service: Cardiovascular;  Laterality: N/A;   LEFT HEART CATHETERIZATION WITH CORONARY ANGIOGRAM N/A 10/03/2011   Procedure: LEFT HEART CATHETERIZATION WITH CORONARY ANGIOGRAM;  Surgeon: Sinclair Grooms, MD;  Location: [facility] CATH LAB;  Service: Cardiovascular;  Laterality: N/A;   LEFT HEART CATHETERIZATION WITH CORONARY ANGIOGRAM N/A 09/12/2012   Procedure: LEFT HEART CATHETERIZATION WITH CORONARY ANGIOGRAM;   Surgeon: Sinclair Grooms, MD;  Location: [facility] CATH LAB;  Service: Cardiovascular;  Laterality: N/A;   LIPOMA EXCISION     Biospy only; left parotid gland   LITHOTRIPSY     none     PERCUTANEOUS CORONARY STENT INTERVENTION (PCI-S) N/A 09/17/2012   Procedure: PERCUTANEOUS CORONARY STENT INTERVENTION (PCI-S);  Surgeon: Sinclair Grooms, MD;  Location: [facility] CATH LAB;  Service: Cardiovascular;  Laterality: N/A;   TRIGGER FINGER RELEASE Left 12/21/2015   Procedure: LEFT LONG FINGER TRIGGER RELEASE ;  Surgeon: Milly Jakob, MD;  Location: [facility];  Service: Orthopedics;  Laterality: Left;    Current Medications: No outpatient medications have been marked as taking for the 01/16/22 encounter (Appointment) with Belva Crome, MD.     Allergies:   Cimetidine and Metformin hcl   Social History   Socioeconomic History   Marital status: Married    Spouse name: Not on file   Number of children: Not on file   Years of education: Not on file   Highest education level: Not on file  Occupational History   Occupation: retired  Tobacco Use   Smoking status: Former    Packs/day: 1.00    Years: 64.00    Total pack years: 64.00    Types: Cigarettes    Quit date: 05/29/2011    Years since quitting: 10.6   Smokeless tobacco: Never  Vaping Use   Vaping Use: Never used  Substance and Sexual Activity   Alcohol use: Yes    Comment: rarely   Drug use: No   Sexual activity: Not Currently  Other Topics Concern   Not on file  Social History Narrative   Denies Caffeine use    Social Determinants of Health   Financial Resource Strain: Not on file  Food Insecurity: Not on file  Transportation Needs: Not on file  Physical Activity: Not on file  Stress: Not on file  Social Connections: Not on file     Family History: The patient's family history includes Aneurysm in his mother; Aortic aneurysm in his father; COPD in his sister; Cancer in his brother; Other in his brother, sister,  and sister.  ROS:   Please see the history of present illness.    He has burning in his feet which limits him somewhat.  He sees Dr. Johnney Ou because of CKD.  Some blood work was okay according to the patient.  All other systems reviewed and are negative.  EKGs/Labs/Other Studies Reviewed:    The following studies were reviewed today: Carotid imaging February 2023: Summary:  Right Carotid: Velocities in the right ICA are consistent with a 1-39%  stenosis.   Left Carotid: The extracranial vessels were near-normal with only minimal  wall  thickening or plaque.   Vertebrals:  Bilateral vertebral arteries demonstrate antegrade flow.  Subclavians: Normal flow hemodynamics were seen in bilateral subclavian               arteries.   *See table(s) above for measurements and observations.  Suggest follow up study in 12 months.    EKG:  EKG a new tracing was not performed  Recent Labs: No results found for requested labs within last 365 days.  Recent Lipid Panel    Component Value Date/Time   CHOL 220 (H) 10/14/2017 0742   TRIG 156 (H) 10/14/2017 0742   HDL 39 (L) 10/14/2017 0742   CHOLHDL 5.6 10/14/2017 0742   VLDL 31 10/14/2017 0742   LDLCALC 150 (H) 10/14/2017 0742    Physical Exam:    VS:  There were no vitals taken for this visit.    Wt Readings from Last 3 Encounters:  06/08/21 213 lb 9.6 oz (96.9 kg)  05/12/20 217 lb (98.4 kg)  11/05/19 (!) 218 lb (98.9 kg)     GEN: Obese. No acute distress HEENT: Normal NECK: No JVD. LYMPHATICS: No lymphadenopathy CARDIAC: Scratchy 1/6 systolic right upper sternal murmur. RRR no gallop, or edema. VASCULAR:  Normal Pulses. No bruits. RESPIRATORY:  Clear to auscultation without rales, wheezing or rhonchi  ABDOMEN: Soft, non-tender, non-distended, No pulsatile mass, MUSCULOSKELETAL: No deformity  SKIN: Warm and dry NEUROLOGIC:  Alert and oriented x 3 PSYCHIATRIC:  Normal affect   ASSESSMENT:    1. Coronary  artery disease involving native coronary artery of native heart with angina pectoris ([place])   2. Chronic diastolic CHF (congestive heart failure) ([place])   3. Bilateral carotid artery stenosis   4. Stage 3b chronic kidney disease ([place])   5. Essential hypertension   6. OSA (obstructive sleep apnea)   7. Pure hypercholesterolemia   8. Type 2 diabetes mellitus with complication, without long-term current use of insulin (HCC)    PLAN:    In order of problems listed above:  Stable without angina.  Continue risk prevention. He is on therapy for diastolic heart failure.  Continue Farxiga 10 mg/day.  This will also provide some renal protection. Most recent Doppler study performed in February was low risk. Continue Farxiga Blood pressure control is adequate. Wears CPAP Continue your 80 mg/day. Continue Farxiga, and Glucophage.  The patient requests Dr. Marlou Porch (his wife's doctor).  6 to 80-monthfollow-up.   Continue chronic risk factor modification and encourage physical activity.   Medication Adjustments/Labs and Tests Ordered: Current medicines are reviewed at length with the patient today.  Concerns regarding medicines are outlined above.  No orders of the defined types were placed in this encounter.  No orders of the defined types were placed in this encounter.   There are no Patient Instructions on file for this visit.   Signed, HSinclair Grooms MD  01/15/2022 2:21 PM    [facility]

## 2022-01-16 ENCOUNTER — Ambulatory Visit: Payer: Medicare (Managed Care) | Attending: Interventional Cardiology | Admitting: Interventional Cardiology

## 2022-01-16 ENCOUNTER — Encounter: Payer: Self-pay | Admitting: Interventional Cardiology

## 2022-01-16 VITALS — BP 128/66 | HR 76 | Ht 68.0 in | Wt 206.4 lb

## 2022-01-16 DIAGNOSIS — G4733 Obstructive sleep apnea (adult) (pediatric): Secondary | ICD-10-CM

## 2022-01-16 DIAGNOSIS — E7800 Pure hypercholesterolemia, unspecified: Secondary | ICD-10-CM | POA: Diagnosis not present

## 2022-01-16 DIAGNOSIS — E118 Type 2 diabetes mellitus with unspecified complications: Secondary | ICD-10-CM

## 2022-01-16 DIAGNOSIS — N1832 Chronic kidney disease, stage 3b: Secondary | ICD-10-CM

## 2022-01-16 DIAGNOSIS — I6523 Occlusion and stenosis of bilateral carotid arteries: Secondary | ICD-10-CM | POA: Diagnosis not present

## 2022-01-16 DIAGNOSIS — I25119 Atherosclerotic heart disease of native coronary artery with unspecified angina pectoris: Secondary | ICD-10-CM | POA: Diagnosis not present

## 2022-01-16 DIAGNOSIS — I5032 Chronic diastolic (congestive) heart failure: Secondary | ICD-10-CM | POA: Diagnosis not present

## 2022-01-16 DIAGNOSIS — I10 Essential (primary) hypertension: Secondary | ICD-10-CM

## 2022-01-16 NOTE — Patient Instructions (Signed)
Medication Instructions:  Your physician recommends that you continue on your current medications as directed. Please refer to the Current Medication list given to you today.  *If you need a refill on your cardiac medications before your next appointment, please call your pharmacy*  Lab Work: NONE  Testing/Procedures: NONE  Follow-Up: At [facility], you and your health needs are our priority.  As part of our continuing mission to provide you with exceptional heart care, we have created designated Provider Care Teams.  These Care Teams include your primary Cardiologist (physician) and Advanced Practice Providers (APPs -  Physician Assistants and Nurse Practitioners) who all work together to provide you with the care you need, when you need it.  Your next appointment:   6-9 month(s)  The format for your next appointment:   In Person  Provider:   Candee Furbish, MD  Important Information About Sugar

## 2022-01-25 DIAGNOSIS — M65341 Trigger finger, right ring finger: Secondary | ICD-10-CM | POA: Diagnosis not present

## 2022-01-25 DIAGNOSIS — D485 Neoplasm of uncertain behavior of skin: Secondary | ICD-10-CM | POA: Diagnosis not present

## 2022-03-08 DIAGNOSIS — J441 Chronic obstructive pulmonary disease with (acute) exacerbation: Secondary | ICD-10-CM | POA: Diagnosis not present

## 2022-03-08 DIAGNOSIS — Z6831 Body mass index (BMI) 31.0-31.9, adult: Secondary | ICD-10-CM | POA: Diagnosis not present

## 2022-03-20 ENCOUNTER — Ambulatory Visit: Payer: Medicare (Managed Care) | Admitting: Cardiology

## 2022-04-05 ENCOUNTER — Ambulatory Visit: Payer: Medicare (Managed Care) | Admitting: Podiatry

## 2022-04-05 DIAGNOSIS — E119 Type 2 diabetes mellitus without complications: Secondary | ICD-10-CM

## 2022-04-05 DIAGNOSIS — B351 Tinea unguium: Secondary | ICD-10-CM | POA: Diagnosis not present

## 2022-04-05 DIAGNOSIS — M79675 Pain in left toe(s): Secondary | ICD-10-CM

## 2022-04-05 DIAGNOSIS — M79674 Pain in right toe(s): Secondary | ICD-10-CM

## 2022-04-05 NOTE — Progress Notes (Signed)
   Chief Complaint  Patient presents with   foot care    Patient is here for diabetic foot care.    SUBJECTIVE Patient with a history of diabetes mellitus presents to office today complaining of elongated, thickened nails that cause pain while ambulating in shoes.  Patient is unable to trim their own nails. Patient is here for further evaluation and treatment.  Past Medical History:  Diagnosis Date   Anemia    low iron   Arthritis    BPH (benign prostatic hyperplasia)    Bruises easily    CAD (coronary artery disease)    a. per Dr. Thompson Caul note, prior stenting of Cx in 2007 and 2012. b. DES to Cx in 09/2012.   Chronic diastolic CHF (congestive heart failure) (HCC)    CKD (chronic kidney disease), stage [name] (HCC)    Claustrophobia    Complication of anesthesia    has to have head elevated to keep from strangling on post nasal drip   COPD (chronic obstructive pulmonary disease) ([place])    Diabetes mellitus type II    type 2   Diarrhea 2015   had for a year and a half   Dyspnea    GERD (gastroesophageal reflux disease)    Gout    Granuloma annulare    History of hiatal hernia    History of kidney stones    Litthotrispy   Hyperlipidemia    Hypertension    Neuropathy    Obesity    OSA (obstructive sleep apnea)    Right sided weakness    Stroke ([place]) 10/2017   Weakness right arm and leg   Wears dentures    top   Wears glasses     Allergies  Allergen Reactions   Cimetidine Other (See Comments)    Anxiety Attacks   Metformin Hcl     Other reaction(s): decreased kidney function     OBJECTIVE General Patient is awake, alert, and oriented x 3 and in no acute distress. Derm Skin is dry and supple bilateral. Negative open lesions or macerations. Remaining integument unremarkable. Nails are tender, long, thickened and dystrophic with subungual debris, consistent with onychomycosis, 1-5 bilateral. No signs of infection noted. Vasc  DP and PT pedal pulses palpable  bilaterally. Temperature gradient within normal limits.  Neuro Epicritic and protective threshold sensation diminished bilaterally.  Musculoskeletal Exam No symptomatic pedal deformities noted bilateral. Muscular strength within normal limits.  ASSESSMENT 1. Diabetes Mellitus w/ peripheral neuropathy 2.  Pain due to onychomycosis of toenails bilateral  PLAN OF CARE 1. Patient evaluated today.  Comprehensive diabetic foot evaluation performed today 2. Instructed to maintain good pedal hygiene and foot care. Stressed importance of controlling blood sugar.  3. Mechanical debridement of nails 1-5 bilaterally performed using a nail nipper. Filed with dremel without incident.  4. Return to clinic in 3 mos.     Edrick Kins, DPM [facility]  Dr. Edrick Kins, DPM    [address], [place] [address]                Office [phone]  Fax [phone]

## 2022-04-11 ENCOUNTER — Ambulatory Visit: Payer: 59 | Admitting: Podiatry

## 2022-04-19 DIAGNOSIS — L570 Actinic keratosis: Secondary | ICD-10-CM | POA: Diagnosis not present

## 2022-04-19 DIAGNOSIS — L821 Other seborrheic keratosis: Secondary | ICD-10-CM | POA: Diagnosis not present

## 2022-04-19 DIAGNOSIS — L578 Other skin changes due to chronic exposure to nonionizing radiation: Secondary | ICD-10-CM | POA: Diagnosis not present

## 2022-04-19 DIAGNOSIS — L820 Inflamed seborrheic keratosis: Secondary | ICD-10-CM | POA: Diagnosis not present

## 2022-05-31 DIAGNOSIS — E1142 Type 2 diabetes mellitus with diabetic polyneuropathy: Secondary | ICD-10-CM | POA: Diagnosis not present

## 2022-05-31 DIAGNOSIS — I5032 Chronic diastolic (congestive) heart failure: Secondary | ICD-10-CM | POA: Diagnosis not present

## 2022-05-31 DIAGNOSIS — I2510 Atherosclerotic heart disease of native coronary artery without angina pectoris: Secondary | ICD-10-CM | POA: Diagnosis not present

## 2022-05-31 DIAGNOSIS — J449 Chronic obstructive pulmonary disease, unspecified: Secondary | ICD-10-CM | POA: Diagnosis not present

## 2022-05-31 DIAGNOSIS — N184 Chronic kidney disease, stage 4 (severe): Secondary | ICD-10-CM | POA: Diagnosis not present

## 2022-05-31 DIAGNOSIS — Z6829 Body mass index (BMI) 29.0-29.9, adult: Secondary | ICD-10-CM | POA: Diagnosis not present

## 2022-05-31 DIAGNOSIS — E1122 Type 2 diabetes mellitus with diabetic chronic kidney disease: Secondary | ICD-10-CM | POA: Diagnosis not present

## 2022-07-05 ENCOUNTER — Ambulatory Visit (INDEPENDENT_AMBULATORY_CARE_PROVIDER_SITE_OTHER): Payer: Medicare (Managed Care) | Admitting: Podiatry

## 2022-07-05 DIAGNOSIS — M79674 Pain in right toe(s): Secondary | ICD-10-CM | POA: Diagnosis not present

## 2022-07-05 DIAGNOSIS — B351 Tinea unguium: Secondary | ICD-10-CM

## 2022-07-05 DIAGNOSIS — M79675 Pain in left toe(s): Secondary | ICD-10-CM | POA: Diagnosis not present

## 2022-07-05 NOTE — Progress Notes (Signed)
   Chief Complaint  Patient presents with   Diabetes    Patient came in today for Diabetic foot care     SUBJECTIVE Patient with a history of diabetes mellitus presents to office today complaining of elongated, thickened nails that cause pain while ambulating in shoes.  Patient is unable to trim their own nails. Patient is here for further evaluation and treatment.  Past Medical History:  Diagnosis Date   Anemia    low iron   Arthritis    BPH (benign prostatic hyperplasia)    Bruises easily    CAD (coronary artery disease)    a. per Dr. Thompson Caul note, prior stenting of Cx in 2007 and 2012. b. DES to Cx in 09/2012.   Chronic diastolic CHF (congestive heart failure) (HCC)    CKD (chronic kidney disease), stage [name] (HCC)    Claustrophobia    Complication of anesthesia    has to have head elevated to keep from strangling on post nasal drip   COPD (chronic obstructive pulmonary disease) ([place])    Diabetes mellitus type II    type 2   Diarrhea 2015   had for a year and a half   Dyspnea    GERD (gastroesophageal reflux disease)    Gout    Granuloma annulare    History of hiatal hernia    History of kidney stones    Litthotrispy   Hyperlipidemia    Hypertension    Neuropathy    Obesity    OSA (obstructive sleep apnea)    Right sided weakness    Stroke ([place]) 10/2017   Weakness right arm and leg   Wears dentures    top   Wears glasses     Allergies  Allergen Reactions   Cimetidine Other (See Comments)    Anxiety Attacks   Metformin Hcl     Other reaction(s): decreased kidney function     OBJECTIVE General Patient is awake, alert, and oriented x 3 and in no acute distress. Derm Skin is dry and supple bilateral. Negative open lesions or macerations. Remaining integument unremarkable. Nails are tender, long, thickened and dystrophic with subungual debris, consistent with onychomycosis, 1-5 bilateral. No signs of infection noted. Vasc  DP and PT pedal pulses palpable  bilaterally. Temperature gradient within normal limits.  Neuro Epicritic and protective threshold sensation diminished bilaterally.  Musculoskeletal Exam No symptomatic pedal deformities noted bilateral. Muscular strength within normal limits.  ASSESSMENT 1. Diabetes Mellitus w/ peripheral neuropathy 2.  Pain due to onychomycosis of toenails bilateral  PLAN OF CARE 1. Patient evaluated today. 2. Instructed to maintain good pedal hygiene and foot care. Stressed importance of controlling blood sugar.  3. Mechanical debridement of nails 1-5 bilaterally performed using a nail nipper. Filed with dremel without incident.  4. Return to clinic in 3 mos.     Edrick Kins, DPM [facility]  Dr. Edrick Kins, DPM    [address], [place] [address]                Office [phone]  Fax [phone]

## 2022-07-18 ENCOUNTER — Encounter: Payer: Self-pay | Admitting: Cardiology

## 2022-07-18 ENCOUNTER — Ambulatory Visit: Payer: No Typology Code available for payment source | Attending: Cardiology | Admitting: Cardiology

## 2022-07-18 VITALS — BP 100/60 | HR 63 | Ht 68.0 in | Wt 200.0 lb

## 2022-07-18 DIAGNOSIS — N184 Chronic kidney disease, stage 4 (severe): Secondary | ICD-10-CM | POA: Diagnosis not present

## 2022-07-18 DIAGNOSIS — I5032 Chronic diastolic (congestive) heart failure: Secondary | ICD-10-CM

## 2022-07-18 DIAGNOSIS — I10 Essential (primary) hypertension: Secondary | ICD-10-CM

## 2022-07-18 NOTE — Patient Instructions (Signed)
Medication Instructions:  The current medical regimen is effective;  continue present plan and medications.  *If you need a refill on your cardiac medications before your next appointment, please call your pharmacy*  Follow-Up: At [facility], you and your health needs are our priority.  As part of our continuing mission to provide you with exceptional heart care, we have created designated Provider Care Teams.  These Care Teams include your primary Cardiologist (physician) and Advanced Practice Providers (APPs -  Physician Assistants and Nurse Practitioners) who all work together to provide you with the care you need, when you need it.  We recommend signing up for the patient portal called "MyChart".  Sign up information is provided on this After Visit Summary.  MyChart is used to connect with patients for Virtual Visits (Telemedicine).  Patients are able to view lab/test results, encounter notes, upcoming appointments, etc.  Non-urgent messages can be sent to your provider as well.   To learn more about what you can do with MyChart, go to [email or web address]://www.mychart.com.    Your next appointment:   6 month(s)  Provider:   Tessa Conte, PA-C, Ernest Dick, NP, Michele Lenze, PA-C, Michelle Swinyer, NP, or Scott Weaver, PA-C     Then, Mark Skains, MD will plan to see you again in 1 year(s).     

## 2022-07-18 NOTE — Progress Notes (Signed)
Cardiology Office Note:    Date:  07/18/2022   ID:  Marco Acevedo, DOB 02/09/1935, MRN [id number]  PCP:  Marco Acevedo, Marco Alan, Acevedo   [facility] Providers Cardiologist:  Marco SchultzMark Felicitas Sine, Acevedo     Referring Acevedo: Marco Acevedo, Marco Alan, Acevedo    History of Present Illness:    Marco HireJohn J Acevedo is a 88 y.o. male former patient of Dr. Sherilyn CooterHenry Acevedo's wife, with coronary artery disease prior circumflex stent chronic diastolic heart failure hyperlipidemia hypertension obesity obstructive sleep apnea.    Had stroke related to left brain atheroembolic event from left carotid endarterectomy on 10/19/2017.  Cardiac catheterization for progressive chest pain with stable anatomy.  Always seems to be short of breath has not changed over the past year.  He plays golf twice a week.  No chest discomfort or has minimal atypical discomfort.  Past Medical History:  Diagnosis Date   Anemia    low iron   Arthritis    BPH (benign prostatic hyperplasia)    Bruises easily    CAD (coronary artery disease)    a. per Dr. Michaelle Acevedo's note, prior stenting of Cx in 2007 and 2012. b. DES to Cx in 09/2012.   Chronic diastolic CHF (congestive heart failure)    CKD (chronic kidney disease), stage Acevedo    Claustrophobia    Complication of anesthesia    has to have head elevated to keep from strangling on post nasal drip   COPD (chronic obstructive pulmonary disease)    Diabetes mellitus type II    type 2   Diarrhea 2015   had for a year and a half   Dyspnea    GERD (gastroesophageal reflux disease)    Gout    Granuloma annulare    History of hiatal hernia    History of kidney stones    Litthotrispy   Hyperlipidemia    Hypertension    Neuropathy    Obesity    OSA (obstructive sleep apnea)    Right sided weakness    Stroke 10/2017   Weakness right arm and leg   Wears dentures    top   Wears glasses     Past Surgical History:  Procedure Laterality Date   BLEPHAROPLASTY     CARDIAC CATHETERIZATION  [id number]   X  2 stents   CHOLECYSTECTOMY N/A 09/23/2014   Procedure: LAPAROSCOPIC CHOLECYSTECTOMY WITH INTRAOPERATIVE CHOLANGIOGRAM;  Surgeon: Marco PrettyPaul Toth [name], Acevedo;  Location: [facility] OR;  Service: General;  Laterality: N/A;   COLONOSCOPY     COLONOSCOPY WITH PROPOFOL N/A 03/22/2016   Procedure: COLONOSCOPY WITH PROPOFOL;  Surgeon: Marco RakesVincent Schooler, Acevedo;  [facility];  Service: Endoscopy;  Laterality: N/A;   ENDARTERECTOMY Left 10/19/2017   Procedure: ENDARTERECTOMY CAROTID LEFT;  Surgeon: Marco Acevedo, Marco Acevedo;  [facility];  Service: Vascular;  Laterality: Left;   ESOPHAGOGASTRODUODENOSCOPY (EGD) WITH PROPOFOL N/A 03/22/2016   Procedure: ESOPHAGOGASTRODUODENOSCOPY (EGD) WITH PROPOFOL;  Surgeon: Marco RakesVincent Schooler, Acevedo;  [facility];  Service: Endoscopy;  Laterality: N/A;   EYE SURGERY     both cataracts   KNEE ARTHROSCOPY WITH MEDIAL MENISECTOMY Right 08/19/2013   Procedure: RIGHT KNEE ARTHROSCOPY WITH PARTIAL MEDIAL MENISECTOMY AND CHONDROPLASTY;  Surgeon: Velna OchsPeter G Dalldorf, Acevedo;  Location: [facility];  Service: Orthopedics;  Laterality: Right;   LEFT HEART CATH AND CORONARY ANGIOGRAPHY N/A 06/28/2016   Procedure: Left Heart Cath and Coronary Angiography;  Surgeon: Lyn RecordsHenry W Smith, Acevedo;  [facility];  Service:  Cardiovascular;  Laterality: N/A;   LEFT HEART CATH AND CORONARY ANGIOGRAPHY N/A 02/25/2019   Procedure: LEFT HEART CATH AND CORONARY ANGIOGRAPHY;  Surgeon: Lyn Records, Acevedo;  Location: [facility] INVASIVE CV LAB;  Service: Cardiovascular;  Laterality: N/A;   LEFT HEART CATHETERIZATION WITH CORONARY ANGIOGRAM N/A 10/03/2011   Procedure: LEFT HEART CATHETERIZATION WITH CORONARY ANGIOGRAM;  Surgeon: Lesleigh Noe, Acevedo;  [facility];  Service: Cardiovascular;  Laterality: N/A;   LEFT HEART CATHETERIZATION WITH CORONARY ANGIOGRAM N/A 09/12/2012   Procedure: LEFT HEART CATHETERIZATION WITH CORONARY ANGIOGRAM;  Surgeon: Lesleigh Noe, Acevedo;  Location: [facility] CATH LAB;   Service: Cardiovascular;  Laterality: N/A;   LIPOMA EXCISION     Biospy only; left parotid gland   LITHOTRIPSY     none     PERCUTANEOUS CORONARY STENT INTERVENTION (PCI-S) N/A 09/17/2012   Procedure: PERCUTANEOUS CORONARY STENT INTERVENTION (PCI-S);  Surgeon: Lesleigh Noe, Acevedo;  Location: [facility] CATH LAB;  Service: Cardiovascular;  Laterality: N/A;   TRIGGER FINGER RELEASE Left 12/21/2015   Procedure: LEFT LONG FINGER TRIGGER RELEASE ;  Surgeon: Mack Hook, Acevedo;  Location: [facility];  Service: Orthopedics;  Laterality: Left;    Current Medications: Current Meds  Medication Sig   albuterol (PROVENTIL) (2.5 MG/3ML) 0.083% nebulizer solution Take 2.5 mg by nebulization every 6 (six) hours as needed for wheezing or shortness of breath.    albuterol (VENTOLIN HFA) 108 (90 Base) MCG/ACT inhaler Inhale 2 puffs into the lungs every 6 (six) hours as needed for wheezing or shortness of breath.    allopurinol (ZYLOPRIM) 100 MG tablet Take 100 mg by mouth daily.   amLODipine (NORVASC) 5 MG tablet Take 5 mg by mouth daily.   aspirin EC 81 MG tablet Take 1 tablet (81 mg total) by mouth daily.   atorvastatin (LIPITOR) 80 MG tablet Take 1 tablet (80 mg total) by mouth daily at 6 PM.   carvedilol (COREG) 3.125 MG tablet Take 3.125 mg by mouth 2 (two) times daily with a meal.   cholecalciferol (VITAMIN D3) 25 MCG (1000 UT) tablet Take 1,000 Units by mouth daily.   clopidogrel (PLAVIX) 75 MG tablet Take 1 tablet (75 mg total) by mouth daily.   dapagliflozin propanediol (FARXIGA) 10 MG TABS tablet Take 1 tablet (10 mg total) by mouth daily before breakfast.   Ferrous Sulfate (IRON) 325 (65 FE) MG TABS Take 325 mg by mouth daily.    finasteride (PROSCAR) 5 MG tablet Take 1 tablet (5 mg total) by mouth daily.   gabapentin (NEURONTIN) 300 MG capsule Take 1 capsule (300 mg total) by mouth at bedtime.   glipiZIDE (GLUCOTROL) 5 MG tablet Take 5 mg by mouth 2 (two) times daily before a meal.     guaifenesin (HUMIBID E) 400 MG TABS tablet Take 400 mg by mouth at bedtime.   isosorbide mononitrate (IMDUR) 120 MG 24 hr tablet Take 1 tablet (120 mg total) by mouth daily.   ketorolac (TORADOL) 60 MG/2ML SOLN injection Inject 60 mg into the muscle once.   losartan (COZAAR) 25 MG tablet Take 25 mg by mouth daily.   nitroGLYCERIN (NITROSTAT) 0.4 MG SL tablet Place 0.4 mg under the tongue every 5 (five) minutes as needed for chest pain.   nystatin (MYCOSTATIN/NYSTOP) powder Apply topically.   pantoprazole (PROTONIX) 40 MG tablet Take 40 mg by mouth daily.   primidone (MYSOLINE) 50 MG tablet Take 50 mg by mouth at bedtime.    sucralfate (  CARAFATE) 1 GM/10ML suspension Take 1 g by mouth 4 (four) times daily.   traMADol (ULTRAM) 50 MG tablet Take 1 tablet (50 mg total) by mouth at bedtime as needed for moderate pain.   triamcinolone cream (KENALOG) 0.1 % Apply topically.   vitamin B-12 (CYANOCOBALAMIN) 1000 MCG tablet Take 1,000 mcg by mouth daily.     Allergies:   Cimetidine and Metformin hcl   Social History   Socioeconomic History   Marital status: Married    Spouse name: Not on file   Number of children: Not on file   Years of education: Not on file   Highest education level: Not on file  Occupational History   Occupation: retired  Tobacco Use   Smoking status: Former    Packs/day: 1.00    Years: 64.00    Additional pack years: 0.00    Total pack years: 64.00    Types: Cigarettes    Quit date: 05/29/2011    Years since quitting: 11.1   Smokeless tobacco: Never  Vaping Use   Vaping Use: Never used  Substance and Sexual Activity   Alcohol use: Yes    Comment: rarely   Drug use: No   Sexual activity: Not Currently  Other Topics Concern   Not on file  Social History Narrative   Denies Caffeine use    Social Determinants of Health   Financial Resource Strain: Not on file  Food Insecurity: Not on file  Transportation Needs: Not on file  Physical Activity: Not on file   Stress: Not on file  Social Connections: Not on file     Family History: The patient's family history includes Aneurysm in his mother; Aortic aneurysm in his father; COPD in his sister; Cancer in his brother; Other in his brother, sister, and sister.  ROS:   Please see the history of present illness.     All other systems reviewed and are negative.  EKGs/Labs/Other Studies Reviewed:    The following studies were reviewed today: February 2023 carotids: Right 1 to 39%, left minimal plaque  EKG:  The ekg ordered today demonstrates rhythm 63 no other abnormalities.  Recent Labs: No results found for requested labs within last 365 days.  Recent Lipid Panel    Component Value Date/Time   CHOL 220 (H) 10/14/2017 0742   TRIG 156 (H) 10/14/2017 0742   HDL 39 (L) 10/14/2017 0742   CHOLHDL 5.6 10/14/2017 0742   VLDL 31 10/14/2017 0742   LDLCALC 150 (H) 10/14/2017 0742     Risk Assessment/Calculations:               Physical Exam:    VS:  BP 100/60 (BP Location: Left Arm, Patient Position: Sitting, Cuff Size: Normal)   Pulse 63   Ht 5\' 8"  (1.727 m)   Wt 200 lb (90.7 kg)   BMI 30.41 kg/m     Wt Readings from Last 3 Encounters:  07/18/22 200 lb (90.7 kg)  01/16/22 206 lb 6.4 oz (93.6 kg)  06/08/21 213 lb 9.6 oz (96.9 kg)     GEN:  Well nourished, well developed in no acute distress HEENT: Normal NECK: No JVD; No carotid bruits LYMPHATICS: No lymphadenopathy CARDIAC: RRR, no murmurs, rubs, gallops RESPIRATORY:  Clear to auscultation without rales, wheezing or rhonchi  ABDOMEN: Soft, non-tender, non-distended MUSCULOSKELETAL:  No edema; No deformity  SKIN: Warm and dry NEUROLOGIC:  Alert and oriented x 3 PSYCHIATRIC:  Normal affect   ASSESSMENT:    1. Chronic  diastolic CHF (congestive heart failure)   2. Essential hypertension   3. CKD (chronic kidney disease) stage 4, GFR 15-29 ml/min    PLAN:    In order of problems listed above:  Coronary artery  disease -Prior circumflex stents.  Doing well.  Occasional atypical chest discomfort.  Continuing with goal-directed medical therapy.  Beta-blocker statin aspirin Plavix diabetic medication.  Diabetes with hypertension - Continue with current medication management.  Farxiga included, SGLT2 inhibitor.  Excellent.  Hyperlipidemia - LDL 60 at goal.  Atorvastatin 80.    Chronic kidney disease stage IV - Seeing nephrology.  Angiotensin receptor blocker.  Prior creatinine 2.4.  Prior stroke with left carotid artery endarterectomy -Hold off on carotid endarterectomy surveillance this year.  Stable last year.                 Medication Adjustments/Labs and Tests Ordered: Current medicines are reviewed at length with the patient today.  Concerns regarding medicines are outlined above.  Orders Placed This Encounter  Procedures   EKG 12-Lead   No orders of the defined types were placed in this encounter.   Patient Instructions  Medication Instructions:  The current medical regimen is effective;  continue present plan and medications.  *If you need a refill on your cardiac medications before your next appointment, please call your pharmacy*  Follow-Up: At [facility], you and your health needs are our priority.  As part of our continuing mission to provide you with exceptional heart care, we have created designated Provider Care Teams.  These Care Teams include your primary Cardiologist (physician) and Advanced Practice Providers (APPs -  Physician Assistants and Nurse Practitioners) who all work together to provide you with the care you need, when you need it.  We recommend signing up for the patient portal called "MyChart".  Sign up information is provided on this After Visit Summary.  MyChart is used to connect with patients for Virtual Visits (Telemedicine).  Patients are able to view lab/test results, encounter notes, upcoming appointments, etc.  Non-urgent messages  can be sent to your provider as well.   To learn more about what you can do with MyChart, go to [email or web address].    Your next appointment:   6 month(s)  Provider:   Jari Favre, PA-C, Robin Searing, NP, Jacolyn Reedy, PA-C, Eligha Bridegroom, NP, or Tereso Newcomer, PA-C     Then, Marco Cumming, Acevedo will plan to see you again in 1 year(s).       Signed, Marco Rosamond, Acevedo  07/18/2022 12:01 PM    [facility]

## 2022-07-19 DIAGNOSIS — L578 Other skin changes due to chronic exposure to nonionizing radiation: Secondary | ICD-10-CM | POA: Diagnosis not present

## 2022-07-19 DIAGNOSIS — L821 Other seborrheic keratosis: Secondary | ICD-10-CM | POA: Diagnosis not present

## 2022-07-19 DIAGNOSIS — L570 Actinic keratosis: Secondary | ICD-10-CM | POA: Diagnosis not present

## 2022-08-10 ENCOUNTER — Emergency Department (HOSPITAL_COMMUNITY): Payer: No Typology Code available for payment source

## 2022-08-10 ENCOUNTER — Other Ambulatory Visit: Payer: Self-pay

## 2022-08-10 ENCOUNTER — Encounter (HOSPITAL_COMMUNITY): Payer: Self-pay | Admitting: Internal Medicine

## 2022-08-10 ENCOUNTER — Inpatient Hospital Stay (HOSPITAL_COMMUNITY): Payer: No Typology Code available for payment source | Admitting: Anesthesiology

## 2022-08-10 ENCOUNTER — Inpatient Hospital Stay (HOSPITAL_COMMUNITY)
Admission: EM | Admit: 2022-08-10 | Discharge: 2022-08-16 | DRG: 481 | Disposition: A | Discharge status: Rehabilitation Facility | Payer: No Typology Code available for payment source | Attending: Internal Medicine | Admitting: Internal Medicine

## 2022-08-10 DIAGNOSIS — I130 Hypertensive heart and chronic kidney disease with heart failure and stage 1 through stage 4 chronic kidney disease, or unspecified chronic kidney disease: Secondary | ICD-10-CM | POA: Diagnosis present

## 2022-08-10 DIAGNOSIS — K219 Gastro-esophageal reflux disease without esophagitis: Secondary | ICD-10-CM | POA: Diagnosis present

## 2022-08-10 DIAGNOSIS — R000 Tachycardia, unspecified: Secondary | ICD-10-CM | POA: Diagnosis not present

## 2022-08-10 DIAGNOSIS — N184 Chronic kidney disease, stage 4 (severe): Secondary | ICD-10-CM | POA: Diagnosis present

## 2022-08-10 DIAGNOSIS — W109XXA Fall (on) (from) unspecified stairs and steps, initial encounter: Secondary | ICD-10-CM | POA: Diagnosis present

## 2022-08-10 DIAGNOSIS — E1142 Type 2 diabetes mellitus with diabetic polyneuropathy: Secondary | ICD-10-CM | POA: Diagnosis present

## 2022-08-10 DIAGNOSIS — Z7984 Long term (current) use of oral hypoglycemic drugs: Secondary | ICD-10-CM

## 2022-08-10 DIAGNOSIS — E119 Type 2 diabetes mellitus without complications: Secondary | ICD-10-CM

## 2022-08-10 DIAGNOSIS — S72142A Displaced intertrochanteric fracture of left femur, initial encounter for closed fracture: Secondary | ICD-10-CM

## 2022-08-10 DIAGNOSIS — Z9861 Coronary angioplasty status: Secondary | ICD-10-CM | POA: Diagnosis not present

## 2022-08-10 DIAGNOSIS — I2510 Atherosclerotic heart disease of native coronary artery without angina pectoris: Secondary | ICD-10-CM | POA: Diagnosis present

## 2022-08-10 DIAGNOSIS — S72009A Fracture of unspecified part of neck of unspecified femur, initial encounter for closed fracture: Secondary | ICD-10-CM | POA: Diagnosis present

## 2022-08-10 DIAGNOSIS — D62 Acute posthemorrhagic anemia: Secondary | ICD-10-CM | POA: Diagnosis not present

## 2022-08-10 DIAGNOSIS — Y9239 Other specified sports and athletic area as the place of occurrence of the external cause: Secondary | ICD-10-CM | POA: Diagnosis not present

## 2022-08-10 DIAGNOSIS — E1122 Type 2 diabetes mellitus with diabetic chronic kidney disease: Secondary | ICD-10-CM | POA: Diagnosis present

## 2022-08-10 DIAGNOSIS — R0902 Hypoxemia: Secondary | ICD-10-CM | POA: Diagnosis not present

## 2022-08-10 DIAGNOSIS — Z87442 Personal history of urinary calculi: Secondary | ICD-10-CM | POA: Diagnosis not present

## 2022-08-10 DIAGNOSIS — Z79899 Other long term (current) drug therapy: Secondary | ICD-10-CM

## 2022-08-10 DIAGNOSIS — I10 Essential (primary) hypertension: Secondary | ICD-10-CM | POA: Diagnosis present

## 2022-08-10 DIAGNOSIS — S51011A Laceration without foreign body of right elbow, initial encounter: Secondary | ICD-10-CM | POA: Diagnosis present

## 2022-08-10 DIAGNOSIS — N400 Enlarged prostate without lower urinary tract symptoms: Secondary | ICD-10-CM | POA: Diagnosis present

## 2022-08-10 DIAGNOSIS — E785 Hyperlipidemia, unspecified: Secondary | ICD-10-CM | POA: Diagnosis present

## 2022-08-10 DIAGNOSIS — I5032 Chronic diastolic (congestive) heart failure: Secondary | ICD-10-CM | POA: Diagnosis present

## 2022-08-10 DIAGNOSIS — S72001A Fracture of unspecified part of neck of right femur, initial encounter for closed fracture: Secondary | ICD-10-CM | POA: Diagnosis not present

## 2022-08-10 DIAGNOSIS — M25552 Pain in left hip: Secondary | ICD-10-CM | POA: Diagnosis not present

## 2022-08-10 DIAGNOSIS — M79605 Pain in left leg: Secondary | ICD-10-CM | POA: Diagnosis not present

## 2022-08-10 DIAGNOSIS — Z7982 Long term (current) use of aspirin: Secondary | ICD-10-CM

## 2022-08-10 DIAGNOSIS — D509 Iron deficiency anemia, unspecified: Secondary | ICD-10-CM | POA: Diagnosis present

## 2022-08-10 DIAGNOSIS — F40240 Claustrophobia: Secondary | ICD-10-CM | POA: Diagnosis present

## 2022-08-10 DIAGNOSIS — J449 Chronic obstructive pulmonary disease, unspecified: Secondary | ICD-10-CM | POA: Diagnosis present

## 2022-08-10 DIAGNOSIS — G8911 Acute pain due to trauma: Secondary | ICD-10-CM | POA: Diagnosis not present

## 2022-08-10 DIAGNOSIS — Z8673 Personal history of transient ischemic attack (TIA), and cerebral infarction without residual deficits: Secondary | ICD-10-CM

## 2022-08-10 DIAGNOSIS — G4733 Obstructive sleep apnea (adult) (pediatric): Secondary | ICD-10-CM | POA: Diagnosis present

## 2022-08-10 DIAGNOSIS — F54 Psychological and behavioral factors associated with disorders or diseases classified elsewhere: Secondary | ICD-10-CM | POA: Diagnosis not present

## 2022-08-10 DIAGNOSIS — I350 Nonrheumatic aortic (valve) stenosis: Secondary | ICD-10-CM | POA: Diagnosis present

## 2022-08-10 DIAGNOSIS — I509 Heart failure, unspecified: Secondary | ICD-10-CM | POA: Diagnosis not present

## 2022-08-10 DIAGNOSIS — S72002A Fracture of unspecified part of neck of left femur, initial encounter for closed fracture: Secondary | ICD-10-CM

## 2022-08-10 DIAGNOSIS — I25119 Atherosclerotic heart disease of native coronary artery with unspecified angina pectoris: Secondary | ICD-10-CM | POA: Diagnosis not present

## 2022-08-10 DIAGNOSIS — I5042 Chronic combined systolic (congestive) and diastolic (congestive) heart failure: Secondary | ICD-10-CM | POA: Diagnosis not present

## 2022-08-10 DIAGNOSIS — Z7902 Long term (current) use of antithrombotics/antiplatelets: Secondary | ICD-10-CM

## 2022-08-10 DIAGNOSIS — W19XXXA Unspecified fall, initial encounter: Principal | ICD-10-CM

## 2022-08-10 DIAGNOSIS — Z955 Presence of coronary angioplasty implant and graft: Secondary | ICD-10-CM

## 2022-08-10 DIAGNOSIS — Z825 Family history of asthma and other chronic lower respiratory diseases: Secondary | ICD-10-CM | POA: Diagnosis not present

## 2022-08-10 DIAGNOSIS — N1832 Chronic kidney disease, stage 3b: Secondary | ICD-10-CM | POA: Diagnosis not present

## 2022-08-10 DIAGNOSIS — K5900 Constipation, unspecified: Secondary | ICD-10-CM | POA: Diagnosis present

## 2022-08-10 DIAGNOSIS — M109 Gout, unspecified: Secondary | ICD-10-CM | POA: Diagnosis present

## 2022-08-10 DIAGNOSIS — Z888 Allergy status to other drugs, medicaments and biological substances status: Secondary | ICD-10-CM

## 2022-08-10 DIAGNOSIS — M1990 Unspecified osteoarthritis, unspecified site: Secondary | ICD-10-CM | POA: Diagnosis present

## 2022-08-10 DIAGNOSIS — S728X2A Other fracture of left femur, initial encounter for closed fracture: Secondary | ICD-10-CM | POA: Diagnosis not present

## 2022-08-10 DIAGNOSIS — Z87891 Personal history of nicotine dependence: Secondary | ICD-10-CM | POA: Diagnosis not present

## 2022-08-10 DIAGNOSIS — I110 Hypertensive heart disease with heart failure: Secondary | ICD-10-CM | POA: Diagnosis not present

## 2022-08-10 DIAGNOSIS — G4730 Sleep apnea, unspecified: Secondary | ICD-10-CM | POA: Diagnosis not present

## 2022-08-10 DIAGNOSIS — I351 Nonrheumatic aortic (valve) insufficiency: Secondary | ICD-10-CM | POA: Diagnosis not present

## 2022-08-10 HISTORY — DX: Nonrheumatic aortic (valve) stenosis: I35.0

## 2022-08-10 HISTORY — DX: Chronic kidney disease, stage 4 (severe): N18.4

## 2022-08-10 LAB — CBC WITH DIFFERENTIAL/PLATELET
Abs Immature Granulocytes: 0.03 10*3/uL (ref 0.00–0.07)
Basophils Absolute: 0.1 10*3/uL (ref 0.0–0.1)
Basophils Relative: 1 %
Eosinophils Absolute: 0.2 10*3/uL (ref 0.0–0.5)
Eosinophils Relative: 3 %
HCT: 45 % (ref 39.0–52.0)
Hemoglobin: 15 g/dL (ref 13.0–17.0)
Immature Granulocytes: 0 %
Lymphocytes Relative: 17 %
Lymphs Abs: 1.3 10*3/uL (ref 0.7–4.0)
MCH: 29.3 pg (ref 26.0–34.0)
MCHC: 33.3 g/dL (ref 30.0–36.0)
MCV: 87.9 fL (ref 80.0–100.0)
Monocytes Absolute: 0.4 10*3/uL (ref 0.1–1.0)
Monocytes Relative: 6 %
Neutro Abs: 5.7 10*3/uL (ref 1.7–7.7)
Neutrophils Relative %: 73 %
Platelets: 244 10*3/uL (ref 150–400)
RBC: 5.12 MIL/uL (ref 4.22–5.81)
RDW: 13.8 % (ref 11.5–15.5)
WBC: 7.8 10*3/uL (ref 4.0–10.5)
nRBC: 0 % (ref 0.0–0.2)

## 2022-08-10 LAB — I-STAT CHEM 8, ED
BUN: 51 mg/dL — ABNORMAL HIGH (ref 8–23)
Calcium, Ion: 1.17 mmol/L (ref 1.15–1.40)
Chloride: 106 mmol/L (ref 98–111)
Creatinine, Ser: 2.9 mg/dL — ABNORMAL HIGH (ref 0.61–1.24)
Glucose, Bld: 110 mg/dL — ABNORMAL HIGH (ref 70–99)
HCT: 45 % (ref 39.0–52.0)
Hemoglobin: 15.3 g/dL (ref 13.0–17.0)
Potassium: 4.5 mmol/L (ref 3.5–5.1)
Sodium: 140 mmol/L (ref 135–145)
TCO2: 23 mmol/L (ref 22–32)

## 2022-08-10 LAB — BASIC METABOLIC PANEL
Anion gap: 10 (ref 5–15)
BUN: 54 mg/dL — ABNORMAL HIGH (ref 8–23)
CO2: 22 mmol/L (ref 22–32)
Calcium: 9.3 mg/dL (ref 8.9–10.3)
Chloride: 105 mmol/L (ref 98–111)
Creatinine, Ser: 2.58 mg/dL — ABNORMAL HIGH (ref 0.61–1.24)
GFR, Estimated: 24 mL/min — ABNORMAL LOW (ref >60)
Glucose, Bld: 113 mg/dL — ABNORMAL HIGH (ref 70–99)
Potassium: 4.5 mmol/L (ref 3.5–5.1)
Sodium: 137 mmol/L (ref 135–145)

## 2022-08-10 LAB — GLUCOSE, CAPILLARY
Glucose-Capillary: 98 mg/dL (ref 70–99)
Glucose-Capillary: 187 mg/dL — ABNORMAL HIGH (ref 70–99)

## 2022-08-10 LAB — PROTIME-INR
INR: 1 (ref 0.8–1.2)
Prothrombin Time: 13.4 seconds (ref 11.4–15.2)

## 2022-08-10 LAB — HEMOGLOBIN A1C
Hgb A1c MFr Bld: 6.5 % — ABNORMAL HIGH (ref 4.8–5.6)
Mean Plasma Glucose: 139.85 mg/dL

## 2022-08-10 MED ORDER — MELATONIN 5 MG PO TABS
5.0000 mg | ORAL_TABLET | Freq: Every evening | ORAL | Status: AC | PRN
  Administered 2022-08-10 – 2022-08-15 (×3): 5 mg via ORAL
  Filled 2022-08-10 (×3): qty 1

## 2022-08-10 MED ORDER — CHLORHEXIDINE GLUCONATE 4 % EX SOLN
60.0000 mL | Freq: Once | CUTANEOUS | Status: DC
  Filled 2022-08-10: qty 60

## 2022-08-10 MED ORDER — POVIDONE-IODINE 10 % EX SWAB: 2.0000 | Freq: Once | CUTANEOUS | Status: DC

## 2022-08-10 MED ORDER — FENTANYL CITRATE PF 50 MCG/ML IJ SOSY
50.0000 ug | PREFILLED_SYRINGE | Freq: Once | INTRAMUSCULAR | Status: AC
  Administered 2022-08-10: 50 ug via INTRAVENOUS

## 2022-08-10 MED ORDER — INSULIN ASPART 100 UNIT/ML IJ SOLN
0.0000 [IU] | Freq: Three times a day (TID) | INTRAMUSCULAR | Status: DC
  Administered 2022-08-12: 2 [IU] via SUBCUTANEOUS
  Administered 2022-08-12 (×2): 1 [IU] via SUBCUTANEOUS
  Administered 2022-08-13: 2 [IU] via SUBCUTANEOUS
  Administered 2022-08-13: 1 [IU] via SUBCUTANEOUS
  Administered 2022-08-13 – 2022-08-15 (×5): 2 [IU] via SUBCUTANEOUS
  Administered 2022-08-15: 1 [IU] via SUBCUTANEOUS
  Administered 2022-08-15 – 2022-08-16 (×4): 2 [IU] via SUBCUTANEOUS

## 2022-08-10 MED ORDER — DEXAMETHASONE SODIUM PHOSPHATE 4 MG/ML IJ SOLN
INTRAMUSCULAR | Status: DC | PRN
  Administered 2022-08-10: 5 mg via PERINEURAL

## 2022-08-10 MED ORDER — CEFAZOLIN SODIUM-DEXTROSE 2-4 GM/100ML-% IV SOLN
2.0000 g | INTRAVENOUS | Status: AC
  Administered 2022-08-11: 2 g via INTRAVENOUS
  Filled 2022-08-10: qty 100

## 2022-08-10 MED ORDER — FENTANYL CITRATE PF 50 MCG/ML IJ SOSY
50.0000 ug | PREFILLED_SYRINGE | Freq: Once | INTRAMUSCULAR | Status: AC
  Administered 2022-08-10: 50 ug via INTRAVENOUS
  Filled 2022-08-10: qty 1

## 2022-08-10 MED ORDER — FENTANYL CITRATE PF 50 MCG/ML IJ SOSY
50.0000 ug | PREFILLED_SYRINGE | Freq: Once | INTRAMUSCULAR | Status: DC
  Filled 2022-08-10: qty 1

## 2022-08-10 MED ORDER — ROPIVACAINE HCL 5 MG/ML IJ SOLN
INTRAMUSCULAR | Status: DC | PRN
  Administered 2022-08-10: 30 mL via PERINEURAL

## 2022-08-10 MED ORDER — FENTANYL CITRATE (PF) 100 MCG/2ML IJ SOLN
INTRAMUSCULAR | Status: AC
  Filled 2022-08-10: qty 2

## 2022-08-10 MED ORDER — CLONIDINE HCL (ANALGESIA) 100 MCG/ML EP SOLN
EPIDURAL | Status: DC | PRN
  Administered 2022-08-10: 80 ug

## 2022-08-10 NOTE — ED Triage Notes (Signed)
Patient [place] fall [place] one step. Pt with L leg pain, external rotation noted. No LOC. On Plavix.

## 2022-08-10 NOTE — ED Notes (Signed)
Got patient on the monitor did manual blood pressure got patient into a gown patient is resting with call bell in reach and family at bedside

## 2022-08-10 NOTE — TOC CAGE-AID Note (Signed)
Transition of Care [facility]) - CAGE-AID Screening   Patient Details  Name: Marco Acevedo MRN: [id number] Date of Birth: [birth year]   Hewitt Shorts, RN Trauma Response Nurse Phone Number: [phone] 08/10/2022, 4:45 PM   Clinical Narrative:  [name] was golfing today when he [place], lost his balance, and fell. He had immediate left hip pain and could not get up. He was brought to the ED where x-rays showed a left hip fx and orthopedic surgery was consulted. He lives at home with his wife and does not use any assistive devices to ambulate. Plan for OR tomorrow (08/11/22)    CAGE-AID Screening:    Have You Ever Felt You Ought to Cut Down on Your Drinking or Drug Use?: No Have People Annoyed You By Critizing Your Drinking Or Drug Use?: No Have You Felt Bad Or Guilty About Your Drinking Or Drug Use?: No Have You Ever Had a Drink or Used Drugs First Thing In The Morning to Steady Your Nerves or to Get Rid of a Hangover?: No CAGE-AID Score: 0  Substance Abuse Education Offered: No (States he drinks a "couple of beers a week")

## 2022-08-10 NOTE — Anesthesia Procedure Notes (Signed)
Anesthesia Regional Block: Femoral nerve block   Pre-Anesthetic Checklist: , timeout performed,  Correct Patient, Correct Site, Correct Laterality,  Correct Procedure, Correct Position, site marked,  Risks and benefits discussed,  Surgical consent,  Pre-op evaluation,  At surgeon's request and post-op pain management  Laterality: Lower and Left  Prep: chloraprep       Needles:  Injection technique: Single-shot  Needle Type: Stimulator Needle - 80     Needle Length: 9cm  Needle Gauge: 22   Needle insertion depth: 6 cm   Additional Needles:   Procedures:,,,, ultrasound used (permanent image in chart),,    Narrative:  Start time: 08/10/2022 4:46 PM End time: 08/10/2022 5:07 PM Injection made incrementally with aspirations every 5 mL.  Performed by: Personally  Anesthesiologist: [name], MD  Additional Notes: BP cuff, EKG monitors applied. Sedation begun. Femoral artery palpated for location of nerve. After nerve location verified with U/S, anesthetic injected incrementally, slowly, and after negative aspirations under direct u/s guidance. Good perineural spread. Patient tolerated well.

## 2022-08-10 NOTE — ED Notes (Signed)
ED TO INPATIENT HANDOFF REPORT  ED Nurse Name and Phone #: Redmond Pulling [phone]  S Name/Age/Gender Marco Acevedo 88 y.o. male Room/Bed: 015C/015C  Code Status   Code Status: Full Code  Home/SNF/Other Home Patient oriented to: self, place, time, and situation Is this baseline? Yes   Triage Complete: Triage complete  Chief Complaint Hip fracture [facility]) [S72.009A]  Triage Note Patient with [place] fall [place] one step. Pt with L leg pain, external rotation noted. No LOC. On Plavix.    Allergies Allergies  Allergen Reactions   Cimetidine Other (See Comments)    Anxiety Attacks   Metformin Hcl     Other reaction(s): decreased kidney function    Level of Care/Admitting Diagnosis ED Disposition     ED Disposition  Admit   Condition  --   Comment  Hospital Area: [facility] [[id number]]  Level of Care: Med-Surg [16]  May admit patient to [facility] or [facility] if equivalent level of care is available:: No  Covid Evaluation: Asymptomatic - no recent exposure (last 10 days) testing not required  Diagnosis: Hip fracture [facility]) [[id number]]  Admitting Physician: Emeline General [[id number]]  Attending Physician: Emeline General [[id number]]  Certification:: I certify this patient will need inpatient services for at least 2 midnights  Estimated Length of Stay: 2          B Medical/Surgery History Past Medical History:  Diagnosis Date   Anemia    low iron   Aortic stenosis    Arthritis    BPH (benign prostatic hyperplasia)    Bruises easily    CAD (coronary artery disease)    Chronic diastolic CHF (congestive heart failure) (HCC)    CKD (chronic kidney disease), stage [name] (HCC)    Claustrophobia    Complication of anesthesia    has to have head elevated to keep from strangling on post nasal drip   COPD (chronic obstructive pulmonary disease) (HCC)    Diabetes mellitus type II    type 2   Diarrhea 2015   had for a year and a half   Dyspnea     GERD (gastroesophageal reflux disease)    Gout    Granuloma annulare    History of hiatal hernia    History of kidney stones    Litthotrispy   Hyperlipidemia    Hypertension    Neuropathy    Obesity    OSA (obstructive sleep apnea)    Right sided weakness    Stroke (HCC) 10/2017   Weakness right arm and leg   Wears dentures    top   Wears glasses    Past Surgical History:  Procedure Laterality Date   BLEPHAROPLASTY     CARDIAC CATHETERIZATION  [id number],7846   X 2 stents   CHOLECYSTECTOMY N/A 09/23/2014   Procedure: LAPAROSCOPIC CHOLECYSTECTOMY WITH INTRAOPERATIVE CHOLANGIOGRAM;  Surgeon: Chevis Pretty [name], MD;  Location: [facility] OR;  Service: General;  Laterality: N/A;   COLONOSCOPY     COLONOSCOPY WITH PROPOFOL N/A 03/22/2016   Procedure: COLONOSCOPY WITH PROPOFOL;  Surgeon: Charlott Rakes, MD;  Location: [facility] ENDOSCOPY;  Service: Endoscopy;  Laterality: N/A;   ENDARTERECTOMY Left 10/19/2017   Procedure: ENDARTERECTOMY CAROTID LEFT;  Surgeon: Larina Earthly, MD;  Location[facility] OR;  Service: Vascular;  Laterality: Left;   ESOPHAGOGASTRODUODENOSCOPY (EGD) WITH PROPOFOL N/A 03/22/2016   Procedure: ESOPHAGOGASTRODUODENOSCOPY (EGD) WITH PROPOFOL;  Surgeon: Charlott Rakes, MD;  Location: [facility] ENDOSCOPY;  Service: Endoscopy;  Laterality: N/A;  EYE SURGERY     both cataracts   KNEE ARTHROSCOPY WITH MEDIAL MENISECTOMY Right 08/19/2013   Procedure: RIGHT KNEE ARTHROSCOPY WITH PARTIAL MEDIAL MENISECTOMY AND CHONDROPLASTY;  Surgeon: Velna Ochs, MD;  Location: [facility];  Service: Orthopedics;  Laterality: Right;   LEFT HEART CATH AND CORONARY ANGIOGRAPHY N/A 06/28/2016   Procedure: Left Heart Cath and Coronary Angiography;  Surgeon: Lyn Records, MD;  Location: [facility] INVASIVE CV LAB;  Service: Cardiovascular;  Laterality: N/A;   LEFT HEART CATH AND CORONARY ANGIOGRAPHY N/A 02/25/2019   Procedure: LEFT HEART CATH AND CORONARY ANGIOGRAPHY;  Surgeon: Lyn Records, MD;  Location: [facility]  INVASIVE CV LAB;  Service: Cardiovascular;  Laterality: N/A;   LEFT HEART CATHETERIZATION WITH CORONARY ANGIOGRAM N/A 10/03/2011   Procedure: LEFT HEART CATHETERIZATION WITH CORONARY ANGIOGRAM;  Surgeon: Lesleigh Noe, MD;  Location: [facility] CATH LAB;  Service: Cardiovascular;  Laterality: N/A;   LEFT HEART CATHETERIZATION WITH CORONARY ANGIOGRAM N/A 09/12/2012   Procedure: LEFT HEART CATHETERIZATION WITH CORONARY ANGIOGRAM;  Surgeon: Lesleigh Noe, MD;  Location: [facility] CATH LAB;  Service: Cardiovascular;  Laterality: N/A;   LIPOMA EXCISION     Biospy only; left parotid gland   LITHOTRIPSY     none     PERCUTANEOUS CORONARY STENT INTERVENTION (PCI-S) N/A 09/17/2012   Procedure: PERCUTANEOUS CORONARY STENT INTERVENTION (PCI-S);  Surgeon: Lesleigh Noe, MD;  Location: [facility] CATH LAB;  Service: Cardiovascular;  Laterality: N/A;   TRIGGER FINGER RELEASE Left 12/21/2015   Procedure: LEFT LONG FINGER TRIGGER RELEASE ;  Surgeon: Mack Hook, MD;  Location: [facility];  Service: Orthopedics;  Laterality: Left;     A IV Location/Drains/Wounds Patient Lines/Drains/Airways Status     Active Line/Drains/Airways     Name Placement date Placement time Site Days   Peripheral IV 08/10/22 20 G Right Antecubital 08/10/22  1343  Antecubital  less than 1   Incision (Closed) 10/19/17 Neck Left 10/19/17  1449  -- 1756            Intake/Output Last 24 hours No intake or output data in the 24 hours ending 08/10/22 1611  Labs/Imaging Results for orders placed or performed during the hospital encounter of 08/10/22 (from the past 48 hour(s))  CBC with Differential     Status: None   Collection Time: 08/10/22  1:40 PM  Result Value Ref Range   WBC 7.8 4.0 - 10.5 K/uL   RBC 5.12 4.22 - 5.81 MIL/uL   Hemoglobin 15.0 13.0 - 17.0 g/dL   HCT 16.1 09.6 - 04.5 %   MCV 87.9 80.0 - 100.0 fL   MCH 29.3 26.0 - 34.0 pg   MCHC 33.3 30.0 - 36.0 g/dL   RDW 40.9 81.1 - 91.4 %   Platelets 244 150 -  400 K/uL   nRBC 0.0 0.0 - 0.2 %   Neutrophils Relative % 73 %   Neutro Abs 5.7 1.7 - 7.7 K/uL   Lymphocytes Relative 17 %   Lymphs Abs 1.3 0.7 - 4.0 K/uL   Monocytes Relative 6 %   Monocytes Absolute 0.4 0.1 - 1.0 K/uL   Eosinophils Relative 3 %   Eosinophils Absolute 0.2 0.0 - 0.5 K/uL   Basophils Relative 1 %   Basophils Absolute 0.1 0.0 - 0.1 K/uL   Immature Granulocytes 0 %   Abs Immature Granulocytes 0.03 0.00 - 0.07 K/uL    Comment: Performed at [facility], [address], [place], [place] [address]  Basic metabolic panel     Status: Abnormal   Collection Time: 08/10/22  1:40 PM  Result Value Ref Range   Sodium 137 135 - 145 mmol/L   Potassium 4.5 3.5 - 5.1 mmol/L   Chloride 105 98 - 111 mmol/L   CO2 22 22 - 32 mmol/L   Glucose, Bld 113 (H) 70 - 99 mg/dL    Comment: Glucose reference range applies only to samples taken after fasting for at least 8 hours.   BUN 54 (H) 8 - 23 mg/dL   Creatinine, Ser 6.04 (H) 0.61 - 1.24 mg/dL   Calcium 9.3 8.9 - 54.0 mg/dL   GFR, Estimated 24 (L) >60 mL/min    Comment: (NOTE) Calculated using the CKD-EPI Creatinine Equation (2021)    Anion gap 10 5 - 15    Comment: Performed at [facility], [address], [place], [place] [address]  Protime-INR     Status: None   Collection Time: 08/10/22  1:40 PM  Result Value Ref Range   Prothrombin Time 13.4 11.4 - 15.2 seconds   INR 1.0 0.8 - 1.2    Comment: (NOTE) INR goal varies based on device and disease states. Performed at [facility], [address], [place], [place] [address]   I-stat chem 8, ED (not at [facility], [facility] or [facility])     Status: Abnormal   Collection Time: 08/10/22  2:41 PM  Result Value Ref Range   Sodium 140 135 - 145 mmol/L   Potassium 4.5 3.5 - 5.1 mmol/L   Chloride 106 98 - 111 mmol/L   BUN 51 (H) 8 - 23 mg/dL   Creatinine, Ser 9.56 (H) 0.61 - 1.24 mg/dL   Glucose, Bld 213 (H) 70 - 99 mg/dL    Comment: Glucose reference range applies only to samples  taken after fasting for at least 8 hours.   Calcium, Ion 1.17 1.15 - 1.40 mmol/L   TCO2 23 22 - 32 mmol/L   Hemoglobin 15.3 13.0 - 17.0 g/dL   HCT 08.6 57.8 - 46.9 %   DG Wrist Complete Left  Result Date: 08/10/2022 CLINICAL DATA:  Trauma, fall EXAM: LEFT WRIST - COMPLETE 3+ VIEW COMPARISON:  None Available. FINDINGS: No recent fracture or dislocation is seen. Subcortical cyst is seen in lunate. Bony spurs are seen in first carpometacarpal and first metacarpophalangeal joints. Arterial calcifications are seen in soft tissues. IMPRESSION: No recent fracture or dislocation is seen in left wrist. Electronically Signed   By: Ernie Avena M.D.   On: 08/10/2022 15:49   DG Chest Portable 1 View  Result Date: 08/10/2022 CLINICAL DATA:  Trauma, fall EXAM: PORTABLE CHEST 1 VIEW COMPARISON:  02/23/2019 FINDINGS: Cardiac size is in upper limits of normal. There are no signs of pulmonary edema or focal pulmonary consolidation. Patchy infiltrate in right lower lung field seen in the earlier CT could not be clearly identified on the radiograph. There is no pleural effusion or pneumothorax. Air soft tissue interface seen in the lateral aspect of left lower lung field may be due to skin fold. IMPRESSION: There are no signs of pulmonary edema or focal pulmonary consolidation. Electronically Signed   By: Ernie Avena M.D.   On: 08/10/2022 15:45   CT CHEST ABDOMEN PELVIS WO CONTRAST  Result Date: 08/10/2022 CLINICAL DATA:  Trauma, fall EXAM: CT CHEST, ABDOMEN AND PELVIS WITHOUT CONTRAST TECHNIQUE: Multidetector CT imaging of the chest, abdomen and pelvis was performed following  the standard protocol without IV contrast. RADIATION DOSE REDUCTION: This exam was performed according to the departmental dose-optimization program which includes automated exposure control, adjustment of the mA and/or kV according to patient size and/or use of iterative reconstruction technique. COMPARISON:  CT pelvis done on  02/12/2021, CT abdomen and pelvis done on 10/17/2012 FINDINGS: CT CHEST FINDINGS Cardiovascular: Coronary artery calcifications are seen. Scattered calcifications are seen in thoracic aorta and its major branches. Mediastinum/Nodes: No acute findings are seen. Lungs/Pleura: There are patchy alveolar infiltrates in posteromedial right lower lung field. Small linear densities are seen in left lower lung field. There is no pleural effusion or pneumothorax. Musculoskeletal: No acute findings are seen in bony structures in the thorax. CT ABDOMEN PELVIS FINDINGS Hepatobiliary: Surgical clips are seen in gallbladder fossa. There is no dilation of bile ducts. Pancreas: No focal abnormalities are seen. Spleen: Unremarkable. Adrenals/Urinary Tract: Adrenals are unremarkable. There is no hydronephrosis. There are scattered calcifications in renal artery branches. No definite renal or ureteral stones are seen. Urinary bladder is unremarkable. Stomach/Bowel: Stomach is unremarkable. Small bowel loops are not dilated. Appendix is not dilated. There is no significant wall thickening in colon. Multiple diverticula are seen in colon without signs of focal acute diverticulitis. Vascular/Lymphatic: Arterial calcifications are seen in aorta and its major branches. There is a 3 x 2.3 cm aneurysm in the right common iliac artery. There is 2.3 x 2.2 cm aneurysm in the left common iliac artery. Reproductive: Prostate is enlarged projecting into the base of the bladder. There are scattered calcifications in prostate. Other: There is no ascites or pneumoperitoneum. Small umbilical hernia containing fat is seen. Small bilateral inguinal hernias containing fat are noted. Musculoskeletal: There is comminuted intertrochanteric fracture in proximal left femur. There is no dislocation. Degenerative changes are noted in both hips. There is large Schmorl's node in the upper endplate of body of L4 vertebra. Degenerative changes are noted in thoracic  and lumbar spine. IMPRESSION: Recent comminuted intertrochanteric fracture is seen in proximal left femur. No other definite recent fractures are seen. There is large Schmorl's node in the upper endplate of body of L4 vertebra. Degenerative changes are noted in thoracic and lumbar spine with bony spurs. Patchy alveolar infiltrate is seen in right lower lobe suggesting atelectasis/pneumonia. Small linear densities in left lower lung field may suggest scarring or subsegmental atelectasis. There is no evidence of hematoma in mediastinum and retroperitoneal region. There is no demonstrable laceration in solid organs. There is no bowel wall thickening. Coronary artery disease. Aortic atherosclerosis. Aneurysmal dilation is seen in both common iliac arteries, larger on the right side. Diverticulosis of colon without signs of focal diverticulitis. Enlarged prostate. Electronically Signed   By: Ernie Avena M.D.   On: 08/10/2022 15:23   CT Head Wo Contrast  Result Date: 08/10/2022 CLINICAL DATA:  Head trauma, moderate-severe; Neck trauma (Age >= 65y). Mechanical fall. EXAM: CT HEAD WITHOUT CONTRAST CT CERVICAL SPINE WITHOUT CONTRAST TECHNIQUE: Multidetector CT imaging of the head and cervical spine was performed following the standard protocol without intravenous contrast. Multiplanar CT image reconstructions of the cervical spine were also generated. RADIATION DOSE REDUCTION: This exam was performed according to the departmental dose-optimization program which includes automated exposure control, adjustment of the mA and/or kV according to patient size and/or use of iterative reconstruction technique. COMPARISON:  Head CT 10/17/2017. FINDINGS: CT HEAD FINDINGS Brain: No acute hemorrhage. Unchanged moderate chronic small-vessel disease. Cortical gray-white differentiation is otherwise preserved. Prominence of the ventricles and sulci within  expected range for age. No hydrocephalus or extra-axial collection. No  mass effect or midline shift. Vascular: No hyperdense vessel or unexpected calcification. Skull: No calvarial fracture or suspicious bone lesion. Skull base is unremarkable. Sinuses/Orbits: Unremarkable. Other: None. CT CERVICAL SPINE FINDINGS Alignment: 3 mm degenerative anterolisthesis of C7 on T1. No traumatic malalignment. Skull base and vertebrae: No acute fracture. Normal craniocervical junction. No suspicious bone lesions. Soft tissues and spinal canal: No prevertebral fluid or swelling. No visible canal hematoma. Disc levels: Multilevel lower cervical spondylosis and ossification of the posterior longitudinal ligament, worst at C4-5, where there is at least moderate spinal canal stenosis. Upper chest: Unremarkable. Other: None. IMPRESSION: 1. No acute intracranial abnormality. 2. No acute fracture or traumatic malalignment of the cervical spine. 3. Multilevel lower cervical spondylosis and ossification of the posterior longitudinal ligament, worst at C4-5, where there is at least moderate spinal canal stenosis. Electronically Signed   By: Orvan Falconer M.D.   On: 08/10/2022 15:05   CT Cervical Spine Wo Contrast  Result Date: 08/10/2022 CLINICAL DATA:  Head trauma, moderate-severe; Neck trauma (Age >= 65y). Mechanical fall. EXAM: CT HEAD WITHOUT CONTRAST CT CERVICAL SPINE WITHOUT CONTRAST TECHNIQUE: Multidetector CT imaging of the head and cervical spine was performed following the standard protocol without intravenous contrast. Multiplanar CT image reconstructions of the cervical spine were also generated. RADIATION DOSE REDUCTION: This exam was performed according to the departmental dose-optimization program which includes automated exposure control, adjustment of the mA and/or kV according to patient size and/or use of iterative reconstruction technique. COMPARISON:  Head CT 10/17/2017. FINDINGS: CT HEAD FINDINGS Brain: No acute hemorrhage. Unchanged moderate chronic small-vessel disease. Cortical  gray-white differentiation is otherwise preserved. Prominence of the ventricles and sulci within expected range for age. No hydrocephalus or extra-axial collection. No mass effect or midline shift. Vascular: No hyperdense vessel or unexpected calcification. Skull: No calvarial fracture or suspicious bone lesion. Skull base is unremarkable. Sinuses/Orbits: Unremarkable. Other: None. CT CERVICAL SPINE FINDINGS Alignment: 3 mm degenerative anterolisthesis of C7 on T1. No traumatic malalignment. Skull base and vertebrae: No acute fracture. Normal craniocervical junction. No suspicious bone lesions. Soft tissues and spinal canal: No prevertebral fluid or swelling. No visible canal hematoma. Disc levels: Multilevel lower cervical spondylosis and ossification of the posterior longitudinal ligament, worst at C4-5, where there is at least moderate spinal canal stenosis. Upper chest: Unremarkable. Other: None. IMPRESSION: 1. No acute intracranial abnormality. 2. No acute fracture or traumatic malalignment of the cervical spine. 3. Multilevel lower cervical spondylosis and ossification of the posterior longitudinal ligament, worst at C4-5, where there is at least moderate spinal canal stenosis. Electronically Signed   By: Orvan Falconer M.D.   On: 08/10/2022 15:05   DG Femur Min 2 Views Left  Result Date: 08/10/2022 CLINICAL DATA:  Fall, left leg pain EXAM: LEFT FEMUR 2 VIEWS COMPARISON:  Pelvic radiographs 08/10/2022 FINDINGS: Acute intertrochanteric fracture of the left proximal femur. No significant angulation. Vascular calcifications noted. IMPRESSION: 1. Acute intertrochanteric fracture of the left proximal femur. Electronically Signed   By: Gaylyn Rong M.D.   On: 08/10/2022 14:42   DG Elbow Complete Right  Result Date: 08/10/2022 CLINICAL DATA:  Fall, right elbow injury EXAM: RIGHT ELBOW - COMPLETE 3+ VIEW COMPARISON:  None Available. FINDINGS: IV tubing noted. Mild epicondylar spurring medially and  laterally. Mild spurring along the coronoid process. Accounting for obliquity, no elbow joint effusion or visible fracture. Supinator fat pad unremarkable. IMPRESSION: 1. Mild degenerative findings. No fracture or  joint effusion identified. Electronically Signed   By: Gaylyn Rong M.D.   On: 08/10/2022 14:40   DG Pelvis Portable  Result Date: 08/10/2022 CLINICAL DATA:  Fall [place] golf course.  Left leg pain. EXAM: PORTABLE PELVIS 1-2 VIEWS COMPARISON:  CT pelvis 09/12/2020 FINDINGS: Mild axial articular space narrowing in both hips. Spurring and subcortical cyst formation along the left upper acetabulum. No appreciable fracture or acute bony findings. Vascular calcifications noted. IMPRESSION: 1. Mild degenerative findings in both hips. No acute findings. Electronically Signed   By: Gaylyn Rong M.D.   On: 08/10/2022 14:39    Pending Labs Unresulted Labs (From admission, onward)     Start     Ordered   08/10/22 1600  Hemoglobin A1c  Once,   R       Comments: To assess prior glycemic control    08/10/22 1559            Vitals/Pain Today's Vitals   08/10/22 1342 08/10/22 1345 08/10/22 1400 08/10/22 1500  BP:  (!) 169/84 (!) 164/113 (!) 176/95  Pulse:  86 90 93  Resp:  18 20 (!) 21  Temp:      TempSrc:      SpO2:  95% 95% 93%  Weight: 86.6 kg     Height: 5\' 8"  (1.727 m)     PainSc:        Isolation Precautions No active isolations  Medications Medications  fentaNYL (SUBLIMAZE) injection 50 mcg (50 mcg Intravenous Not Given 08/10/22 1513)  insulin aspart (novoLOG) injection 0-9 Units (has no administration in time range)  fentaNYL (SUBLIMAZE) injection 50 mcg (50 mcg Intravenous Given 08/10/22 1357)  fentaNYL (SUBLIMAZE) injection 50 mcg (50 mcg Intravenous Given 08/10/22 1428)    Mobility Normally ambulatory but due to fall is not ambulatory     Focused Assessments  R Recommendations: See Admitting Provider Note  Report given to:   Additional Notes: in PACU for  nerve block

## 2022-08-10 NOTE — ED Notes (Signed)
Trauma Response Nurse Documentation   Marco Acevedo is a 88 y.o. male arriving to [facility] ED via [facility]  On No antithrombotic. Trauma was activated as a Level 2 by Dr.  Manus Acevedo based on the following trauma criteria Stable femur, humerus, or pelvic fracture via any mechanism except GLF.  Patient cleared for CT by Dr. Manus Acevedo. Pt transported to CT with trauma response nurse present to monitor. RN remained with the patient throughout their absence from the department for clinical observation.   GCS 15.  History   Past Medical History:  Diagnosis Date   Anemia    low iron   Aortic stenosis    Arthritis    BPH (benign prostatic hyperplasia)    Bruises easily    CAD (coronary artery disease)    Chronic diastolic CHF (congestive heart failure) (HCC)    CKD (chronic kidney disease), stage Acevedo (HCC)    Claustrophobia    Complication of anesthesia    has to have head elevated to keep from strangling on post nasal drip   COPD (chronic obstructive pulmonary disease) (HCC)    Diabetes mellitus type II    type 2   Diarrhea 2015   had for a year and a half   Dyspnea    GERD (gastroesophageal reflux disease)    Gout    Granuloma annulare    History of hiatal hernia    History of kidney stones    Litthotrispy   Hyperlipidemia    Hypertension    Neuropathy    Obesity    OSA (obstructive sleep apnea)    Right sided weakness    Stroke (HCC) 10/2017   Weakness right arm and leg   Wears dentures    top   Wears glasses      Past Surgical History:  Procedure Laterality Date   BLEPHAROPLASTY     CARDIAC CATHETERIZATION  1610,9604   X 2 stents   CHOLECYSTECTOMY N/A 09/23/2014   Procedure: LAPAROSCOPIC CHOLECYSTECTOMY WITH INTRAOPERATIVE CHOLANGIOGRAM;  Surgeon: Marco Pretty [name], MD;  Location: [facility] OR;  Service: General;  Laterality: N/A;   COLONOSCOPY     COLONOSCOPY WITH PROPOFOL N/A 03/22/2016   Procedure: COLONOSCOPY WITH PROPOFOL;  Surgeon: Marco Rakes, MD;   Location: [facility] ENDOSCOPY;  Service: Endoscopy;  Laterality: N/A;   ENDARTERECTOMY Left 10/19/2017   Procedure: ENDARTERECTOMY CAROTID LEFT;  Surgeon: Marco Earthly, MD;  Location: [facility] OR;  Service: Vascular;  Laterality: Left;   ESOPHAGOGASTRODUODENOSCOPY (EGD) WITH PROPOFOL N/A 03/22/2016   Procedure: ESOPHAGOGASTRODUODENOSCOPY (EGD) WITH PROPOFOL;  Surgeon: Marco Rakes, MD;  Location: [facility] ENDOSCOPY;  Service: Endoscopy;  Laterality: N/A;   EYE SURGERY     both cataracts   KNEE ARTHROSCOPY WITH MEDIAL MENISECTOMY Right 08/19/2013   Procedure: RIGHT KNEE ARTHROSCOPY WITH PARTIAL MEDIAL MENISECTOMY AND CHONDROPLASTY;  Surgeon: Marco Ochs, MD;  Location: [facility];  Service: Orthopedics;  Laterality: Right;   LEFT HEART CATH AND CORONARY ANGIOGRAPHY N/A 06/28/2016   Procedure: Left Heart Cath and Coronary Angiography;  Surgeon: Marco Records, MD;  Location: [facility] INVASIVE CV LAB;  Service: Cardiovascular;  Laterality: N/A;   LEFT HEART CATH AND CORONARY ANGIOGRAPHY N/A 02/25/2019   Procedure: LEFT HEART CATH AND CORONARY ANGIOGRAPHY;  Surgeon: Marco Records, MD;  Location: [facility] INVASIVE CV LAB;  Service: Cardiovascular;  Laterality: N/A;   LEFT HEART CATHETERIZATION WITH CORONARY ANGIOGRAM N/A 10/03/2011   Procedure: LEFT HEART CATHETERIZATION WITH CORONARY ANGIOGRAM;  Surgeon: Marco Dienes  Leia Alf, MD;  Location: [facility] CATH LAB;  Service: Cardiovascular;  Laterality: N/A;   LEFT HEART CATHETERIZATION WITH CORONARY ANGIOGRAM N/A 09/12/2012   Procedure: LEFT HEART CATHETERIZATION WITH CORONARY ANGIOGRAM;  Surgeon: Marco Noe, MD;  Location: [facility] CATH LAB;  Service: Cardiovascular;  Laterality: N/A;   LIPOMA EXCISION     Biospy only; left parotid gland   LITHOTRIPSY     none     PERCUTANEOUS CORONARY STENT INTERVENTION (PCI-S) N/A 09/17/2012   Procedure: PERCUTANEOUS CORONARY STENT INTERVENTION (PCI-S);  Surgeon: Marco Noe, MD;  Location: [facility] CATH LAB;  Service: Cardiovascular;   Laterality: N/A;   TRIGGER FINGER RELEASE Left 12/21/2015   Procedure: LEFT LONG FINGER TRIGGER RELEASE ;  Surgeon: Marco Hook, MD;  Location: [facility];  Service: Orthopedics;  Laterality: Left;       Initial Focused Assessment (If applicable, or please see trauma documentation):  Airway- Clear Breathing- Unlabored- Hx of long term smoking  Circulation - Small skin tears on arms, no other obvious bleeding- Heart monitor on,    CT's Completed:   CT Head, C-Spine, Chest Abd Pelvis W/O contrast due to GFR being elevated  Interventions:   IV Labs Port Xrays CT scans Pain Control Admit Family at bedside  Plan for disposition:  Admission to floor   Consults completed:  Orthopaedic Surgeon at 1519 -- Marco Igo, PA.  Event Summary:  Pt was golfing today, stepped off a curb, lost balance and fell-- on arrival to ED pt had severe pain in left hip area- no shortening, no external rotation noted. Positive pedal pulses with good cap refill all extremities.  Does not walk with any walker/cane .   Son is at the bedside with pt.   Pt states that he drinks "a couple beers a weeks after golfing" ,     Bedside handoff with ED RN Marco Acevedo.    Marco Acevedo  Trauma Response RN  Please call TRN at [phone] for further assistance.

## 2022-08-10 NOTE — ED Notes (Signed)
Ortho PA Earney Hamburg at bedside.

## 2022-08-10 NOTE — Progress Notes (Signed)
Orthopedic Tech Progress Note Patient Details:  Marco Acevedo 07/29/1934 [id number]   Level II trauma, ortho techs assisted RN to transport pt to CT/help move due to suspected hip fx. Additionally, pt cannot receive overhead frame with trapeze bar as he meets one of the contraindications of use; pt age >/= 61.  Patient ID: Marco Acevedo, male   DOB: [birth year], 88 y.o.   MRN: [id number]  Docia Furl 08/10/2022, 7:03 PM

## 2022-08-10 NOTE — Anesthesia Preprocedure Evaluation (Signed)
Anesthesia Evaluation    Reviewed: Allergy & Precautions, Patient's Chart, lab work & pertinent test results  Airway        Dental   Pulmonary shortness of breath, sleep apnea , COPD, former smoker          Cardiovascular hypertension, + angina  + CAD, + Cardiac Stents and +CHF    Cardiology Note Anne Fu) 07/2022 Coronary artery disease -Prior circumflex stents.  Doing well.  Occasional atypical chest discomfort.  Continuing with goal-directed medical therapy.  Beta-blocker statin aspirin Plavix diabetic medication.   Diabetes with hypertension - Continue with current medication management.  Farxiga included, SGLT2 inhibitor.  Excellent.   Hyperlipidemia - LDL 60 at goal.  Atorvastatin 80.     Chronic kidney disease stage IV - Seeing nephrology.  Angiotensin receptor blocker.  Prior creatinine 2.4.   Prior stroke with left carotid artery endarterectomy -Hold off on carotid endarterectomy surveillance this year.  Stable last year.   Cath 02/2019  Prox LAD to Mid LAD lesion is 55% stenosed.  1st Diag lesion is 85% stenosed.    Patent left main  50% mid LAD with 80% stenosis in the first diagonal (small caliber).  Ostial circumflex 60 to 70%.  50 to 60% stenosis in the proximal stent.  Segmental 70% stenosis in the first obtuse marginal proximal to the distal first obtuse marginal stent which is widely patent.  Continuation of circumflex is small ending on 2 small obtuse marginal branches.  Right coronary contains intermediate stenosis in the mid and distal segment of up to 60%.  No focal high-grade obstruction.  Normal left ventricular end-diastolic pressure.  Contrast not given to minimize load.  Findings are very similar to 2018.  The diffusely diseased area in the first obtuse marginal may be slightly worse.   Recommendations:  Continue medical therapy.  Circumflex disease is diffuse.  In absence of definite change in  overall anatomy, additional exposure to contrast is felt to be higher risk (acute kidney injury) than continuing medical therapy.  If ambulates and remained stable, could discharge later today or in a.m.  Will need to have a follow-up basic metabolic panel in 48 hours.   Echo 2019 Left ventricle: The cavity size was normal. There was mild concentric hypertrophy. Systolic function was normal. The estimated ejection fraction was in the range of 55% to 60%. Wall motion was normal; there were no regional wall motion abnormalities. Doppler parameters are consistent with abnormal left ventricular relaxation (grade 1 diastolic dysfunction). Doppler parameters are consistent with elevated ventricular end-diastolic filling pressure.  - Aortic valve: There was mild stenosis. There was no regurgitation.  - Left atrium: The atrium was mildly dilated.  - Right ventricle: The cavity size was normal. Wall thickness was normal. Systolic function was normal.  - Right atrium: The atrium was normal in size.  - Tricuspid valve: There was no regurgitation.  - Pulmonary arteries: Systolic pressure was within the normal range.  - Inferior vena cava: The vessel was normal in size.  - Pericardium, extracardiac: A trivial pericardial effusion was   identified.   Impressions:   - No cardiac source of emboli was indentified.     Neuro/Psych cerv stenosis and some cord compression noted MRI CVA    GI/Hepatic hiatal hernia,GERD  ,,  Endo/Other  diabetes    Renal/GU Renal InsufficiencyRenal disease     Musculoskeletal  (+) Arthritis ,    Abdominal   Peds  Hematology  (+) Blood dyscrasia, anemia  Anesthesia Other Findings   Reproductive/Obstetrics                             Anesthesia Physical Anesthesia Plan  ASA: 3  Anesthesia Plan: Regional   Post-op Pain Management: Regional block*   Induction:   PONV Risk Score and Plan:   Airway Management Planned:    Additional Equipment:   Intra-op Plan:   Post-operative Plan:   Informed Consent: I have reviewed the patients History and Physical, chart, labs and discussed the procedure including the risks, benefits and alternatives for the proposed anesthesia with the patient or authorized representative who has indicated his/her understanding and acceptance.       Plan Discussed with:   Anesthesia Plan Comments:         Anesthesia Quick Evaluation

## 2022-08-10 NOTE — Consult Note (Signed)
Reason for Consult:Left hip fx Referring Physician: Jeannett Senior Rancour Time called: 1513 Time at bedside: 1519   Marco Acevedo is an 88 y.o. male.  HPI: Marco Acevedo was golfing today when he stepped [place] a curb [place] clubhouse, lost his balance, and fell. He had immediate left hip pain and could not get up. He was brought to the ED where x-rays showed a left hip fx and orthopedic surgery was consulted. He lives at home with his wife and does not use any assistive devices to ambulate.  Past Medical History:  Diagnosis Date   Anemia    low iron   Arthritis    BPH (benign prostatic hyperplasia)    Bruises easily    CAD (coronary artery disease)    a. per Dr. Michaelle Copas note, prior stenting of Cx in 2007 and 2012. b. DES to Cx in 09/2012.   Chronic diastolic CHF (congestive heart failure) (HCC)    CKD (chronic kidney disease), stage [name] (HCC)    Claustrophobia    Complication of anesthesia    has to have head elevated to keep from strangling on post nasal drip   COPD (chronic obstructive pulmonary disease) (HCC)    Diabetes mellitus type II    type 2   Diarrhea 2015   had for a year and a half   Dyspnea    GERD (gastroesophageal reflux disease)    Gout    Granuloma annulare    History of hiatal hernia    History of kidney stones    Litthotrispy   Hyperlipidemia    Hypertension    Neuropathy    Obesity    OSA (obstructive sleep apnea)    Right sided weakness    Stroke (HCC) 10/2017   Weakness right arm and leg   Wears dentures    top   Wears glasses     Past Surgical History:  Procedure Laterality Date   BLEPHAROPLASTY     CARDIAC CATHETERIZATION  1610,9604   X 2 stents   CHOLECYSTECTOMY N/A 09/23/2014   Procedure: LAPAROSCOPIC CHOLECYSTECTOMY WITH INTRAOPERATIVE CHOLANGIOGRAM;  Surgeon: Chevis Pretty [name], MD;  Location: [facility] OR;  Service: General;  Laterality: N/A;   COLONOSCOPY     COLONOSCOPY WITH PROPOFOL N/A 03/22/2016   Procedure: COLONOSCOPY WITH PROPOFOL;  Surgeon: Charlott Rakes, MD;  Location: [facility] ENDOSCOPY;  Service: Endoscopy;  Laterality: N/A;   ENDARTERECTOMY Left 10/19/2017   Procedure: ENDARTERECTOMY CAROTID LEFT;  Surgeon: Larina Earthly, MD;  Location: [facility] OR;  Service: Vascular;  Laterality: Left;   ESOPHAGOGASTRODUODENOSCOPY (EGD) WITH PROPOFOL N/A 03/22/2016   Procedure: ESOPHAGOGASTRODUODENOSCOPY (EGD) WITH PROPOFOL;  Surgeon: Charlott Rakes, MD;  Location: [facility] ENDOSCOPY;  Service: Endoscopy;  Laterality: N/A;   EYE SURGERY     both cataracts   KNEE ARTHROSCOPY WITH MEDIAL MENISECTOMY Right 08/19/2013   Procedure: RIGHT KNEE ARTHROSCOPY WITH PARTIAL MEDIAL MENISECTOMY AND CHONDROPLASTY;  Surgeon: Velna Ochs, MD;  Location: [facility];  Service: Orthopedics;  Laterality: Right;   LEFT HEART CATH AND CORONARY ANGIOGRAPHY N/A 06/28/2016   Procedure: Left Heart Cath and Coronary Angiography;  Surgeon: Lyn Records, MD;  Location: [facility] INVASIVE CV LAB;  Service: Cardiovascular;  Laterality: N/A;   LEFT HEART CATH AND CORONARY ANGIOGRAPHY N/A 02/25/2019   Procedure: LEFT HEART CATH AND CORONARY ANGIOGRAPHY;  Surgeon: Lyn Records, MD;  Location: [facility] INVASIVE CV LAB;  Service: Cardiovascular;  Laterality: N/A;   LEFT HEART CATHETERIZATION WITH CORONARY ANGIOGRAM N/A 10/03/2011  Procedure: LEFT HEART CATHETERIZATION WITH CORONARY ANGIOGRAM;  Surgeon: Lesleigh Noe, MD;  Location: [facility] CATH LAB;  Service: Cardiovascular;  Laterality: N/A;   LEFT HEART CATHETERIZATION WITH CORONARY ANGIOGRAM N/A 09/12/2012   Procedure: LEFT HEART CATHETERIZATION WITH CORONARY ANGIOGRAM;  Surgeon: Lesleigh Noe, MD;  Location: [facility] CATH LAB;  Service: Cardiovascular;  Laterality: N/A;   LIPOMA EXCISION     Biospy only; left parotid gland   LITHOTRIPSY     none     PERCUTANEOUS CORONARY STENT INTERVENTION (PCI-S) N/A 09/17/2012   Procedure: PERCUTANEOUS CORONARY STENT INTERVENTION (PCI-S);  Surgeon: Lesleigh Noe, MD;  Location: [facility] CATH LAB;  Service:  Cardiovascular;  Laterality: N/A;   TRIGGER FINGER RELEASE Left 12/21/2015   Procedure: LEFT LONG FINGER TRIGGER RELEASE ;  Surgeon: Mack Hook, MD;  Location: [facility];  Service: Orthopedics;  Laterality: Left;    Family History  Problem Relation Age of Onset   Aortic aneurysm Father    Aneurysm Mother        brain   Cancer Brother    COPD Sister    Other Sister        health hx unknown   Other Sister        health hx unknown   Other Brother        health hx unknown    Social History:  reports that he quit smoking about 11 years ago. His smoking use included cigarettes. He has a 64.00 pack-year smoking history. He has never used smokeless tobacco. He reports current alcohol use. He reports that he does not use drugs.  Allergies:  Allergies  Allergen Reactions   Cimetidine Other (See Comments)    Anxiety Attacks   Metformin Hcl     Other reaction(s): decreased kidney function    Medications: I have reviewed the patient's current medications.  Results for orders placed or performed during the hospital encounter of 08/10/22 (from the past 48 hour(s))  CBC with Differential     Status: None   Collection Time: 08/10/22  1:40 PM  Result Value Ref Range   WBC 7.8 4.0 - 10.5 K/uL   RBC 5.12 4.22 - 5.81 MIL/uL   Hemoglobin 15.0 13.0 - 17.0 g/dL   HCT 16.1 09.6 - 04.5 %   MCV 87.9 80.0 - 100.0 fL   MCH 29.3 26.0 - 34.0 pg   MCHC 33.3 30.0 - 36.0 g/dL   RDW 40.9 81.1 - 91.4 %   Platelets 244 150 - 400 K/uL   nRBC 0.0 0.0 - 0.2 %   Neutrophils Relative % 73 %   Neutro Abs 5.7 1.7 - 7.7 K/uL   Lymphocytes Relative 17 %   Lymphs Abs 1.3 0.7 - 4.0 K/uL   Monocytes Relative 6 %   Monocytes Absolute 0.4 0.1 - 1.0 K/uL   Eosinophils Relative 3 %   Eosinophils Absolute 0.2 0.0 - 0.5 K/uL   Basophils Relative 1 %   Basophils Absolute 0.1 0.0 - 0.1 K/uL   Immature Granulocytes 0 %   Abs Immature Granulocytes 0.03 0.00 - 0.07 K/uL    Comment: Performed at  [facility], [address], [place], [place] [address]  Basic metabolic panel     Status: Abnormal   Collection Time: 08/10/22  1:40 PM  Result Value Ref Range   Sodium 137 135 - 145 mmol/L   Potassium 4.5 3.5 - 5.1 mmol/L   Chloride 105 98 - 111  mmol/L   CO2 22 22 - 32 mmol/L   Glucose, Bld 113 (H) 70 - 99 mg/dL    Comment: Glucose reference range applies only to samples taken after fasting for at least 8 hours.   BUN 54 (H) 8 - 23 mg/dL   Creatinine, Ser 6.57 (H) 0.61 - 1.24 mg/dL   Calcium 9.3 8.9 - 84.6 mg/dL   GFR, Estimated 24 (L) >60 mL/min    Comment: (NOTE) Calculated using the CKD-EPI Creatinine Equation (2021)    Anion gap 10 5 - 15    Comment: Performed at [facility], [address], [place], [place] [address]  Protime-INR     Status: None   Collection Time: 08/10/22  1:40 PM  Result Value Ref Range   Prothrombin Time 13.4 11.4 - 15.2 seconds   INR 1.0 0.8 - 1.2    Comment: (NOTE) INR goal varies based on device and disease states. Performed at [facility], [address], [place], [place] [address]   I-stat chem 8, ED (not at [facility], [facility] or [facility])     Status: Abnormal   Collection Time: 08/10/22  2:41 PM  Result Value Ref Range   Sodium 140 135 - 145 mmol/L   Potassium 4.5 3.5 - 5.1 mmol/L   Chloride 106 98 - 111 mmol/L   BUN 51 (H) 8 - 23 mg/dL   Creatinine, Ser 2.44 (H) 0.61 - 1.24 mg/dL   Glucose, Bld 010 (H) 70 - 99 mg/dL    Comment: Glucose reference range applies only to samples taken after fasting for at least 8 hours.   Calcium, Ion 1.17 1.15 - 1.40 mmol/L   TCO2 23 22 - 32 mmol/L   Hemoglobin 15.3 13.0 - 17.0 g/dL   HCT 27.2 53.6 - 64.4 %    CT CHEST ABDOMEN PELVIS WO CONTRAST  Result Date: 08/10/2022 CLINICAL DATA:  Trauma, fall EXAM: CT CHEST, ABDOMEN AND PELVIS WITHOUT CONTRAST TECHNIQUE: Multidetector CT imaging of the chest, abdomen and pelvis was performed following the standard protocol without IV contrast. RADIATION DOSE  REDUCTION: This exam was performed according to the departmental dose-optimization program which includes automated exposure control, adjustment of the mA and/or kV according to patient size and/or use of iterative reconstruction technique. COMPARISON:  CT pelvis done on 02/12/2021, CT abdomen and pelvis done on 10/17/2012 FINDINGS: CT CHEST FINDINGS Cardiovascular: Coronary artery calcifications are seen. Scattered calcifications are seen in thoracic aorta and its major branches. Mediastinum/Nodes: No acute findings are seen. Lungs/Pleura: There are patchy alveolar infiltrates in posteromedial right lower lung field. Small linear densities are seen in left lower lung field. There is no pleural effusion or pneumothorax. Musculoskeletal: No acute findings are seen in bony structures in the thorax. CT ABDOMEN PELVIS FINDINGS Hepatobiliary: Surgical clips are seen in gallbladder fossa. There is no dilation of bile ducts. Pancreas: No focal abnormalities are seen. Spleen: Unremarkable. Adrenals/Urinary Tract: Adrenals are unremarkable. There is no hydronephrosis. There are scattered calcifications in renal artery branches. No definite renal or ureteral stones are seen. Urinary bladder is unremarkable. Stomach/Bowel: Stomach is unremarkable. Small bowel loops are not dilated. Appendix is not dilated. There is no significant wall thickening in colon. Multiple diverticula are seen in colon without signs of focal acute diverticulitis. Vascular/Lymphatic: Arterial calcifications are seen in aorta and its major branches. There is a 3 x 2.3 cm aneurysm in the right common iliac artery. There is 2.3 x 2.2 cm aneurysm in the left common iliac  artery. Reproductive: Prostate is enlarged projecting into the base of the bladder. There are scattered calcifications in prostate. Other: There is no ascites or pneumoperitoneum. Small umbilical hernia containing fat is seen. Small bilateral inguinal hernias containing fat are noted.  Musculoskeletal: There is comminuted intertrochanteric fracture in proximal left femur. There is no dislocation. Degenerative changes are noted in both hips. There is large Schmorl's node in the upper endplate of body of L4 vertebra. Degenerative changes are noted in thoracic and lumbar spine. IMPRESSION: Recent comminuted intertrochanteric fracture is seen in proximal left femur. No other definite recent fractures are seen. There is large Schmorl's node in the upper endplate of body of L4 vertebra. Degenerative changes are noted in thoracic and lumbar spine with bony spurs. Patchy alveolar infiltrate is seen in right lower lobe suggesting atelectasis/pneumonia. Small linear densities in left lower lung field may suggest scarring or subsegmental atelectasis. There is no evidence of hematoma in mediastinum and retroperitoneal region. There is no demonstrable laceration in solid organs. There is no bowel wall thickening. Coronary artery disease. Aortic atherosclerosis. Aneurysmal dilation is seen in both common iliac arteries, larger on the right side. Diverticulosis of colon without signs of focal diverticulitis. Enlarged prostate. Electronically Signed   By: Ernie Avena M.D.   On: 08/10/2022 15:23   CT Head Wo Contrast  Result Date: 08/10/2022 CLINICAL DATA:  Head trauma, moderate-severe; Neck trauma (Age >= 65y). Mechanical fall. EXAM: CT HEAD WITHOUT CONTRAST CT CERVICAL SPINE WITHOUT CONTRAST TECHNIQUE: Multidetector CT imaging of the head and cervical spine was performed following the standard protocol without intravenous contrast. Multiplanar CT image reconstructions of the cervical spine were also generated. RADIATION DOSE REDUCTION: This exam was performed according to the departmental dose-optimization program which includes automated exposure control, adjustment of the mA and/or kV according to patient size and/or use of iterative reconstruction technique. COMPARISON:  Head CT 10/17/2017.  FINDINGS: CT HEAD FINDINGS Brain: No acute hemorrhage. Unchanged moderate chronic small-vessel disease. Cortical gray-white differentiation is otherwise preserved. Prominence of the ventricles and sulci within expected range for age. No hydrocephalus or extra-axial collection. No mass effect or midline shift. Vascular: No hyperdense vessel or unexpected calcification. Skull: No calvarial fracture or suspicious bone lesion. Skull base is unremarkable. Sinuses/Orbits: Unremarkable. Other: None. CT CERVICAL SPINE FINDINGS Alignment: 3 mm degenerative anterolisthesis of C7 on T1. No traumatic malalignment. Skull base and vertebrae: No acute fracture. Normal craniocervical junction. No suspicious bone lesions. Soft tissues and spinal canal: No prevertebral fluid or swelling. No visible canal hematoma. Disc levels: Multilevel lower cervical spondylosis and ossification of the posterior longitudinal ligament, worst at C4-5, where there is at least moderate spinal canal stenosis. Upper chest: Unremarkable. Other: None. IMPRESSION: 1. No acute intracranial abnormality. 2. No acute fracture or traumatic malalignment of the cervical spine. 3. Multilevel lower cervical spondylosis and ossification of the posterior longitudinal ligament, worst at C4-5, where there is at least moderate spinal canal stenosis. Electronically Signed   By: Orvan Falconer M.D.   On: 08/10/2022 15:05   CT Cervical Spine Wo Contrast  Result Date: 08/10/2022 CLINICAL DATA:  Head trauma, moderate-severe; Neck trauma (Age >= 65y). Mechanical fall. EXAM: CT HEAD WITHOUT CONTRAST CT CERVICAL SPINE WITHOUT CONTRAST TECHNIQUE: Multidetector CT imaging of the head and cervical spine was performed following the standard protocol without intravenous contrast. Multiplanar CT image reconstructions of the cervical spine were also generated. RADIATION DOSE REDUCTION: This exam was performed according to the departmental dose-optimization program which includes  automated exposure  control, adjustment of the mA and/or kV according to patient size and/or use of iterative reconstruction technique. COMPARISON:  Head CT 10/17/2017. FINDINGS: CT HEAD FINDINGS Brain: No acute hemorrhage. Unchanged moderate chronic small-vessel disease. Cortical gray-white differentiation is otherwise preserved. Prominence of the ventricles and sulci within expected range for age. No hydrocephalus or extra-axial collection. No mass effect or midline shift. Vascular: No hyperdense vessel or unexpected calcification. Skull: No calvarial fracture or suspicious bone lesion. Skull base is unremarkable. Sinuses/Orbits: Unremarkable. Other: None. CT CERVICAL SPINE FINDINGS Alignment: 3 mm degenerative anterolisthesis of C7 on T1. No traumatic malalignment. Skull base and vertebrae: No acute fracture. Normal craniocervical junction. No suspicious bone lesions. Soft tissues and spinal canal: No prevertebral fluid or swelling. No visible canal hematoma. Disc levels: Multilevel lower cervical spondylosis and ossification of the posterior longitudinal ligament, worst at C4-5, where there is at least moderate spinal canal stenosis. Upper chest: Unremarkable. Other: None. IMPRESSION: 1. No acute intracranial abnormality. 2. No acute fracture or traumatic malalignment of the cervical spine. 3. Multilevel lower cervical spondylosis and ossification of the posterior longitudinal ligament, worst at C4-5, where there is at least moderate spinal canal stenosis. Electronically Signed   By: Orvan Falconer M.D.   On: 08/10/2022 15:05   DG Femur Min 2 Views Left  Result Date: 08/10/2022 CLINICAL DATA:  Fall, left leg pain EXAM: LEFT FEMUR 2 VIEWS COMPARISON:  Pelvic radiographs 08/10/2022 FINDINGS: Acute intertrochanteric fracture of the left proximal femur. No significant angulation. Vascular calcifications noted. IMPRESSION: 1. Acute intertrochanteric fracture of the left proximal femur. Electronically Signed   By:  Gaylyn Rong M.D.   On: 08/10/2022 14:42   DG Elbow Complete Right  Result Date: 08/10/2022 CLINICAL DATA:  Fall, right elbow injury EXAM: RIGHT ELBOW - COMPLETE 3+ VIEW COMPARISON:  None Available. FINDINGS: IV tubing noted. Mild epicondylar spurring medially and laterally. Mild spurring along the coronoid process. Accounting for obliquity, no elbow joint effusion or visible fracture. Supinator fat pad unremarkable. IMPRESSION: 1. Mild degenerative findings. No fracture or joint effusion identified. Electronically Signed   By: Gaylyn Rong M.D.   On: 08/10/2022 14:40   DG Pelvis Portable  Result Date: 08/10/2022 CLINICAL DATA:  [place].  Left leg pain. EXAM: PORTABLE PELVIS 1-2 VIEWS COMPARISON:  CT pelvis 09/12/2020 FINDINGS: Mild axial articular space narrowing in both hips. Spurring and subcortical cyst formation along the left upper acetabulum. No appreciable fracture or acute bony findings. Vascular calcifications noted. IMPRESSION: 1. Mild degenerative findings in both hips. No acute findings. Electronically Signed   By: Gaylyn Rong M.D.   On: 08/10/2022 14:39    Review of Systems  HENT:  Negative for ear discharge, ear pain, hearing loss and tinnitus.   Eyes:  Negative for photophobia and pain.  Respiratory:  Negative for cough and shortness of breath.   Cardiovascular:  Negative for chest pain.  Gastrointestinal:  Negative for abdominal pain, nausea and vomiting.  Genitourinary:  Negative for dysuria, flank pain, frequency and urgency.  Musculoskeletal:  Positive for arthralgias (Left hip). Negative for back pain, myalgias and neck pain.  Neurological:  Negative for dizziness and headaches.  Hematological:  Does not bruise/bleed easily.  Psychiatric/Behavioral:  The patient is not nervous/anxious.    Blood pressure (!) 176/95, pulse 93, temperature 98.4 F (36.9 C), temperature source Oral, resp. rate (!) 21, height 5\' 8"  (1.727 m), weight 86.6 kg, SpO2 93  %. Physical Exam Constitutional:      General: He  is not in acute distress.    Appearance: He is well-developed. He is not diaphoretic.  HENT:     Head: Normocephalic and atraumatic.  Eyes:     General: No scleral icterus.       Right eye: No discharge.        Left eye: No discharge.     Conjunctiva/sclera: Conjunctivae normal.  Cardiovascular:     Rate and Rhythm: Normal rate and regular rhythm.  Pulmonary:     Effort: Pulmonary effort is normal. No respiratory distress.  Musculoskeletal:     Cervical back: Normal range of motion.     Comments: LLE No traumatic wounds, ecchymosis, or rash  Severe TTP left hip  No knee or ankle effusion  Knee stable to varus/ valgus and anterior/posterior stress  Sens DPN, SPN, TN intact  Motor EHL, ext, flex, evers 5/5  DP 2+, PT 1+, No significant edema  Skin:    General: Skin is warm and dry.  Neurological:     Mental Status: He is alert.  Psychiatric:        Mood and Affect: Mood normal.        Behavior: Behavior normal.     Assessment/Plan: Left hip fx -- Plan IMN tomorrow with Dr. Blanchie Dessert. Please keep NPO after MN.    Freeman Caldron, PA-C Orthopedic Surgery [phone] 08/10/2022, 3:28 PM

## 2022-08-10 NOTE — ED Provider Notes (Signed)
[place] EMERGENCY DEPARTMENT AT [facility] Provider Note   CSN: [id number] Arrival date & time: 08/10/22  1330     History  Chief Complaint  Patient presents with   Marco Acevedo    Marco Acevedo is a 88 y.o. male.  Patient with a history of hypertension, diabetes, CAD, COPD, Plavix use presenting with left hip and buttock pain.  [place] a wooden step and fell down onto his left side.  Has severe left buttock and hip pain unable to ambulate.  Denies hitting head or losing consciousness.  Skin tear to right elbow and left wrist.  Denies head, neck, back, chest or abdominal pain.  No preceding dizziness or lightheadedness.  No chest pain or shortness of breath.  Takes Plavix but no other blood thinners.  No focal weakness, numbness or tingling. Denies any neck or back pain.  The history is provided by the patient and a relative.  Fall Pertinent negatives include no chest pain, no abdominal pain, no headaches and no shortness of breath.       Home Medications Prior to Admission medications   Medication Sig Start Date End Date Taking? Authorizing Provider  albuterol (PROVENTIL) (2.5 MG/3ML) 0.083% nebulizer solution Take 2.5 mg by nebulization every 6 (six) hours as needed for wheezing or shortness of breath.     [provider]  albuterol (VENTOLIN HFA) 108 (90 Base) MCG/ACT inhaler Inhale 2 puffs into the lungs every 6 (six) hours as needed for wheezing or shortness of breath.     [provider]  allopurinol (ZYLOPRIM) 100 MG tablet Take 100 mg by mouth daily.    [provider]  amLODipine (NORVASC) 5 MG tablet Take 5 mg by mouth daily.    [provider]  aspirin EC 81 MG tablet Take 1 tablet (81 mg total) by mouth daily. 02/20/19   Lyn Records, MD  atorvastatin (LIPITOR) 80 MG tablet Take 1 tablet (80 mg total) by mouth daily at 6 PM. 10/15/17   Leroy Sea, MD  carvedilol (COREG) 3.125 MG  tablet Take 3.125 mg by mouth 2 (two) times daily with a meal.    [provider]  cholecalciferol (VITAMIN D3) 25 MCG (1000 UT) tablet Take 1,000 Units by mouth daily.    [provider]  clopidogrel (PLAVIX) 75 MG tablet Take 1 tablet (75 mg total) by mouth daily. 10/16/17   Leroy Sea, MD  dapagliflozin propanediol (FARXIGA) 10 MG TABS tablet Take 1 tablet (10 mg total) by mouth daily before breakfast. 05/31/21   Lyn Records, MD  Ferrous Sulfate (IRON) 325 (65 FE) MG TABS Take 325 mg by mouth daily.     [provider]  finasteride (PROSCAR) 5 MG tablet Take 1 tablet (5 mg total) by mouth daily. 11/29/11   Oretha Milch, MD  gabapentin (NEURONTIN) 300 MG capsule Take 1 capsule (300 mg total) by mouth at bedtime. 08/20/18   Ihor Austin, NP  glipiZIDE (GLUCOTROL) 5 MG tablet Take 5 mg by mouth 2 (two) times daily before a meal.     [provider]  guaifenesin (HUMIBID E) 400 MG TABS tablet Take 400 mg by mouth at bedtime.    [provider]  isosorbide mononitrate (IMDUR) 120 MG 24 hr tablet Take 1 tablet (120 mg total) by mouth daily. 11/20/14   Lyn Records, MD  ketorolac (TORADOL) 60 MG/2ML SOLN injection Inject 60 mg  into the muscle once. 05/25/20   [provider]  losartan (COZAAR) 25 MG tablet Take 25 mg by mouth daily.    [provider]  nitroGLYCERIN (NITROSTAT) 0.4 MG SL tablet Place 0.4 mg under the tongue every 5 (five) minutes as needed for chest pain.    [provider]  nystatin (MYCOSTATIN/NYSTOP) powder Apply topically. 10/15/19   [provider]  pantoprazole (PROTONIX) 40 MG tablet Take 40 mg by mouth daily.    [provider]  primidone (MYSOLINE) 50 MG tablet Take 50 mg by mouth at bedtime.     [provider]  sucralfate (CARAFATE) 1 GM/10ML suspension Take 1 g by mouth 4 (four) times daily.    [provider]  traMADol (ULTRAM) 50 MG tablet Take 1 tablet (50 mg  total) by mouth at bedtime as needed for moderate pain. 10/20/17   Rhyne, Ames Coupe, PA-C  triamcinolone cream (KENALOG) 0.1 % Apply topically. 07/21/20   [provider]  vitamin B-12 (CYANOCOBALAMIN) 1000 MCG tablet Take 1,000 mcg by mouth daily.    [provider]      Allergies    Cimetidine and Metformin hcl    Review of Systems   Review of Systems  Constitutional:  Negative for activity change, appetite change and fever.  HENT:  Negative for congestion.   Respiratory:  Negative for cough, chest tightness and shortness of breath.   Cardiovascular:  Negative for chest pain.  Gastrointestinal:  Negative for abdominal pain, nausea and vomiting.  Genitourinary:  Negative for dysuria and hematuria.  Musculoskeletal:  Positive for arthralgias and myalgias. Negative for neck pain.  Skin:  Negative for rash.  Neurological:  Positive for weakness. Negative for dizziness, light-headedness and headaches.   all other systems are negative except as noted in the HPI and PMH.    Physical Exam Updated Vital Signs BP (!) 172/70   Pulse 93   Temp 98.4 F (36.9 C) (Oral)   Resp 16   Ht 5\' 8"  (1.727 m)   Wt 86.6 kg   SpO2 98%   BMI 29.04 kg/m  Physical Exam Vitals and nursing note reviewed.  Constitutional:      General: He is not in acute distress.    Appearance: He is well-developed. He is not ill-appearing.  HENT:     Head: Normocephalic and atraumatic.     Mouth/Throat:     Pharynx: No oropharyngeal exudate.  Eyes:     Conjunctiva/sclera: Conjunctivae normal.     Pupils: Pupils are equal, round, and reactive to light.  Neck:     Comments: NO C spine tenderness Cardiovascular:     Rate and Rhythm: Normal rate and regular rhythm.     Heart sounds: Normal heart sounds. No murmur heard. Pulmonary:     Effort: Pulmonary effort is normal. No respiratory distress.     Breath sounds: Normal breath sounds.  Abdominal:     Palpations: Abdomen is soft.      Tenderness: There is no abdominal tenderness. There is no guarding or rebound.  Musculoskeletal:        General: Tenderness present. No deformity. Normal range of motion.     Cervical back: Normal range of motion and neck supple.     Comments: TTP L posterior buttock. No shortening or external rotation. Intact DP and PT pulses  Skin tear right elbow, no bony tenderness, skin tear left wrist without bony tenderness    Skin:    General: Skin is  warm.  Neurological:     General: No focal deficit present.     Mental Status: He is alert and oriented to person, place, and time. Mental status is at baseline.     Cranial Nerves: No cranial nerve deficit.     Motor: No abnormal muscle tone.     Coordination: Coordination normal.     Comments:  5/5 strength throughout. CN 2-12 intact.Equal grip strength.   Psychiatric:        Behavior: Behavior normal.     ED Results / Procedures / Treatments   Labs (all labs ordered are listed, but only abnormal results are displayed) Labs Reviewed  BASIC METABOLIC PANEL - Abnormal; Notable for the following components:      Result Value   Glucose, Bld 113 (*)    BUN 54 (*)    Creatinine, Ser 2.58 (*)    GFR, Estimated 24 (*)    All other components within normal limits  I-STAT CHEM 8, ED - Abnormal; Notable for the following components:   BUN 51 (*)    Creatinine, Ser 2.90 (*)    Glucose, Bld 110 (*)    All other components within normal limits  CBC WITH DIFFERENTIAL/PLATELET  PROTIME-INR  HEMOGLOBIN A1C    EKG EKG Interpretation  Date/Time:  Thursday Aug 10 2022 13:54:46 EDT Ventricular Rate:  89 PR Interval:  208 QRS Duration: 84 QT Interval:  359 QTC Calculation: 437 R Axis:   -8 Text Interpretation: Sinus rhythm No significant change was found Confirmed by Glynn Octave [phone]) on 08/10/2022 3:01:11 PM  Radiology DG Wrist Complete Left  Result Date: 08/10/2022 CLINICAL DATA:  Trauma, fall EXAM: LEFT WRIST - COMPLETE 3+ VIEW  COMPARISON:  None Available. FINDINGS: No recent fracture or dislocation is seen. Subcortical cyst is seen in lunate. Bony spurs are seen in first carpometacarpal and first metacarpophalangeal joints. Arterial calcifications are seen in soft tissues. IMPRESSION: No recent fracture or dislocation is seen in left wrist. Electronically Signed   By: Ernie Avena M.D.   On: 08/10/2022 15:49   DG Chest Portable 1 View  Result Date: 08/10/2022 CLINICAL DATA:  Trauma, fall EXAM: PORTABLE CHEST 1 VIEW COMPARISON:  02/23/2019 FINDINGS: Cardiac size is in upper limits of normal. There are no signs of pulmonary edema or focal pulmonary consolidation. Patchy infiltrate in right lower lung field seen in the earlier CT could not be clearly identified on the radiograph. There is no pleural effusion or pneumothorax. Air soft tissue interface seen in the lateral aspect of left lower lung field may be due to skin fold. IMPRESSION: There are no signs of pulmonary edema or focal pulmonary consolidation. Electronically Signed   By: Ernie Avena M.D.   On: 08/10/2022 15:45   CT CHEST ABDOMEN PELVIS WO CONTRAST  Result Date: 08/10/2022 CLINICAL DATA:  Trauma, fall EXAM: CT CHEST, ABDOMEN AND PELVIS WITHOUT CONTRAST TECHNIQUE: Multidetector CT imaging of the chest, abdomen and pelvis was performed following the standard protocol without IV contrast. RADIATION DOSE REDUCTION: This exam was performed according to the departmental dose-optimization program which includes automated exposure control, adjustment of the mA and/or kV according to patient size and/or use of iterative reconstruction technique. COMPARISON:  CT pelvis done on 02/12/2021, CT abdomen and pelvis done on 10/17/2012 FINDINGS: CT CHEST FINDINGS Cardiovascular: Coronary artery calcifications are seen. Scattered calcifications are seen in thoracic aorta and its major branches. Mediastinum/Nodes: No acute findings are seen. Lungs/Pleura: There are patchy  alveolar infiltrates in posteromedial right  lower lung field. Small linear densities are seen in left lower lung field. There is no pleural effusion or pneumothorax. Musculoskeletal: No acute findings are seen in bony structures in the thorax. CT ABDOMEN PELVIS FINDINGS Hepatobiliary: Surgical clips are seen in gallbladder fossa. There is no dilation of bile ducts. Pancreas: No focal abnormalities are seen. Spleen: Unremarkable. Adrenals/Urinary Tract: Adrenals are unremarkable. There is no hydronephrosis. There are scattered calcifications in renal artery branches. No definite renal or ureteral stones are seen. Urinary bladder is unremarkable. Stomach/Bowel: Stomach is unremarkable. Small bowel loops are not dilated. Appendix is not dilated. There is no significant wall thickening in colon. Multiple diverticula are seen in colon without signs of focal acute diverticulitis. Vascular/Lymphatic: Arterial calcifications are seen in aorta and its major branches. There is a 3 x 2.3 cm aneurysm in the right common iliac artery. There is 2.3 x 2.2 cm aneurysm in the left common iliac artery. Reproductive: Prostate is enlarged projecting into the base of the bladder. There are scattered calcifications in prostate. Other: There is no ascites or pneumoperitoneum. Small umbilical hernia containing fat is seen. Small bilateral inguinal hernias containing fat are noted. Musculoskeletal: There is comminuted intertrochanteric fracture in proximal left femur. There is no dislocation. Degenerative changes are noted in both hips. There is large Schmorl's node in the upper endplate of body of L4 vertebra. Degenerative changes are noted in thoracic and lumbar spine. IMPRESSION: Recent comminuted intertrochanteric fracture is seen in proximal left femur. No other definite recent fractures are seen. There is large Schmorl's node in the upper endplate of body of L4 vertebra. Degenerative changes are noted in thoracic and lumbar spine with  bony spurs. Patchy alveolar infiltrate is seen in right lower lobe suggesting atelectasis/pneumonia. Small linear densities in left lower lung field may suggest scarring or subsegmental atelectasis. There is no evidence of hematoma in mediastinum and retroperitoneal region. There is no demonstrable laceration in solid organs. There is no bowel wall thickening. Coronary artery disease. Aortic atherosclerosis. Aneurysmal dilation is seen in both common iliac arteries, larger on the right side. Diverticulosis of colon without signs of focal diverticulitis. Enlarged prostate. Electronically Signed   By: Ernie Avena M.D.   On: 08/10/2022 15:23   CT Head Wo Contrast  Result Date: 08/10/2022 CLINICAL DATA:  Head trauma, moderate-severe; Neck trauma (Age >= 65y). Mechanical fall. EXAM: CT HEAD WITHOUT CONTRAST CT CERVICAL SPINE WITHOUT CONTRAST TECHNIQUE: Multidetector CT imaging of the head and cervical spine was performed following the standard protocol without intravenous contrast. Multiplanar CT image reconstructions of the cervical spine were also generated. RADIATION DOSE REDUCTION: This exam was performed according to the departmental dose-optimization program which includes automated exposure control, adjustment of the mA and/or kV according to patient size and/or use of iterative reconstruction technique. COMPARISON:  Head CT 10/17/2017. FINDINGS: CT HEAD FINDINGS Brain: No acute hemorrhage. Unchanged moderate chronic small-vessel disease. Cortical gray-white differentiation is otherwise preserved. Prominence of the ventricles and sulci within expected range for age. No hydrocephalus or extra-axial collection. No mass effect or midline shift. Vascular: No hyperdense vessel or unexpected calcification. Skull: No calvarial fracture or suspicious bone lesion. Skull base is unremarkable. Sinuses/Orbits: Unremarkable. Other: None. CT CERVICAL SPINE FINDINGS Alignment: 3 mm degenerative anterolisthesis of C7  on T1. No traumatic malalignment. Skull base and vertebrae: No acute fracture. Normal craniocervical junction. No suspicious bone lesions. Soft tissues and spinal canal: No prevertebral fluid or swelling. No visible canal hematoma. Disc levels: Multilevel lower cervical spondylosis and ossification  of the posterior longitudinal ligament, worst at C4-5, where there is at least moderate spinal canal stenosis. Upper chest: Unremarkable. Other: None. IMPRESSION: 1. No acute intracranial abnormality. 2. No acute fracture or traumatic malalignment of the cervical spine. 3. Multilevel lower cervical spondylosis and ossification of the posterior longitudinal ligament, worst at C4-5, where there is at least moderate spinal canal stenosis. Electronically Signed   By: Orvan Falconer M.D.   On: 08/10/2022 15:05   CT Cervical Spine Wo Contrast  Result Date: 08/10/2022 CLINICAL DATA:  Head trauma, moderate-severe; Neck trauma (Age >= 65y). Mechanical fall. EXAM: CT HEAD WITHOUT CONTRAST CT CERVICAL SPINE WITHOUT CONTRAST TECHNIQUE: Multidetector CT imaging of the head and cervical spine was performed following the standard protocol without intravenous contrast. Multiplanar CT image reconstructions of the cervical spine were also generated. RADIATION DOSE REDUCTION: This exam was performed according to the departmental dose-optimization program which includes automated exposure control, adjustment of the mA and/or kV according to patient size and/or use of iterative reconstruction technique. COMPARISON:  Head CT 10/17/2017. FINDINGS: CT HEAD FINDINGS Brain: No acute hemorrhage. Unchanged moderate chronic small-vessel disease. Cortical gray-white differentiation is otherwise preserved. Prominence of the ventricles and sulci within expected range for age. No hydrocephalus or extra-axial collection. No mass effect or midline shift. Vascular: No hyperdense vessel or unexpected calcification. Skull: No calvarial fracture or  suspicious bone lesion. Skull base is unremarkable. Sinuses/Orbits: Unremarkable. Other: None. CT CERVICAL SPINE FINDINGS Alignment: 3 mm degenerative anterolisthesis of C7 on T1. No traumatic malalignment. Skull base and vertebrae: No acute fracture. Normal craniocervical junction. No suspicious bone lesions. Soft tissues and spinal canal: No prevertebral fluid or swelling. No visible canal hematoma. Disc levels: Multilevel lower cervical spondylosis and ossification of the posterior longitudinal ligament, worst at C4-5, where there is at least moderate spinal canal stenosis. Upper chest: Unremarkable. Other: None. IMPRESSION: 1. No acute intracranial abnormality. 2. No acute fracture or traumatic malalignment of the cervical spine. 3. Multilevel lower cervical spondylosis and ossification of the posterior longitudinal ligament, worst at C4-5, where there is at least moderate spinal canal stenosis. Electronically Signed   By: Orvan Falconer M.D.   On: 08/10/2022 15:05   DG Femur Min 2 Views Left  Result Date: 08/10/2022 CLINICAL DATA:  Fall, left leg pain EXAM: LEFT FEMUR 2 VIEWS COMPARISON:  Pelvic radiographs 08/10/2022 FINDINGS: Acute intertrochanteric fracture of the left proximal femur. No significant angulation. Vascular calcifications noted. IMPRESSION: 1. Acute intertrochanteric fracture of the left proximal femur. Electronically Signed   By: Gaylyn Rong M.D.   On: 08/10/2022 14:42   DG Elbow Complete Right  Result Date: 08/10/2022 CLINICAL DATA:  Fall, right elbow injury EXAM: RIGHT ELBOW - COMPLETE 3+ VIEW COMPARISON:  None Available. FINDINGS: IV tubing noted. Mild epicondylar spurring medially and laterally. Mild spurring along the coronoid process. Accounting for obliquity, no elbow joint effusion or visible fracture. Supinator fat pad unremarkable. IMPRESSION: 1. Mild degenerative findings. No fracture or joint effusion identified. Electronically Signed   By: Gaylyn Rong M.D.    On: 08/10/2022 14:40   DG Pelvis Portable  Result Date: 08/10/2022 CLINICAL DATA:  [place]  Left leg pain. EXAM: PORTABLE PELVIS 1-2 VIEWS COMPARISON:  CT pelvis 09/12/2020 FINDINGS: Mild axial articular space narrowing in both hips. Spurring and subcortical cyst formation along the left upper acetabulum. No appreciable fracture or acute bony findings. Vascular calcifications noted. IMPRESSION: 1. Mild degenerative findings in both hips. No acute findings. Electronically Signed  By: Gaylyn Rong M.D.   On: 08/10/2022 14:39    Procedures Procedures    Medications Ordered in ED Medications  fentaNYL (SUBLIMAZE) injection 50 mcg (50 mcg Intravenous Given 08/10/22 1357)    ED Course/ Medical Decision Making/ A&P                             Medical Decision Making Amount and/or Complexity of Data Reviewed Independent Historian: EMS Labs: ordered. Decision-making details documented in ED Course. Radiology: ordered and independent interpretation performed. Decision-making details documented in ED Course. ECG/medicine tests: ordered and independent interpretation performed. Decision-making details documented in ED Course.  Risk Prescription drug management. Decision regarding hospitalization.   Fall with left hip injury and pain.  No head injury.  Does take Plavix.  Vital stable, GCS 15, ABCs intact.  Xray concerning for L intertrochanteric hip fracture. Results reviewed and interpreted by me.  CT head and C spine negative for acute traumatic injury. Remainder of traumatic imaging is negative.   D/w Charma Igo of orthopedics who will evaluate. Hold plavix.  Cr slightly worse from baseline. Will hydrate.  Admission d/w Dr. Chipper Herb.        Final Clinical Impression(s) / ED Diagnoses Final diagnoses:  Fall, initial encounter  Closed displaced intertrochanteric fracture of left femur, initial encounter [facility])    Rx / DC Orders ED Discharge Orders      None         Marco Acevedo, Jeannett Senior, MD 08/10/22 1739

## 2022-08-10 NOTE — H&P (Signed)
History and Physical    Marco Acevedo ZOX:[id number] DOB: 03-Oct-1934 DOA: 08/10/2022  PCP: Daisy Floro, MD (Confirm with patient/family/NH records and if not entered, this has to be entered at [facility] point of entry) Patient coming from: Home  I have personally briefly reviewed patient's old medical records in [facility]  Chief Complaint: I fell and broke my hip  HPI: Marco Acevedo is a 88 y.o. male with medical history significant of CAD with stenting x2, chronic HFpEF CKD stage IIIb, IIDM, COPD, GERD presented with fall and left hip fracture.  This morning while playing golf [place] the golf course, patient missed 1 step of stairs and fell on left hip left side of chest and head, no LOC, denies any chest pain lightheadedness palpitation before or after the episode.  Feeling excruciating pain of left hip he was brought to the hospital.  Patient reported that poorly controlled GERD symptoms with occasional sharp-like chest pain, and occasional symptoms of shortness of breath after "going to gym".  History of CAD with stenting x 2, recent left heart cath in 2020 showed 80% stenosis of left first diagonal (small caliber) and ostial circumflex 60-70%, 50-60% stenosis in the proximal stent but no recent stress test  ED Course: Vital signs stable blood pressure elevated none tachycardia afebrile.  Trauma scan head and neck CT negative, left elbow negative for fracture or dislocation, positive left intertrochanter fracture.  Blood work creatinine 2.5 about his baseline, hemoglobin 15, K4.5.  Review of Systems: As per HPI otherwise 14 point review of systems negative.    Past Medical History:  Diagnosis Date   Anemia    low iron   Aortic stenosis    Arthritis    BPH (benign prostatic hyperplasia)    Bruises easily    CAD (coronary artery disease)    Chronic diastolic CHF (congestive heart failure) (HCC)    CKD (chronic kidney disease), stage [name] (HCC)    Claustrophobia    Complication  of anesthesia    has to have head elevated to keep from strangling on post nasal drip   COPD (chronic obstructive pulmonary disease) (HCC)    Diabetes mellitus type II    type 2   Diarrhea 2015   had for a year and a half   Dyspnea    GERD (gastroesophageal reflux disease)    Gout    Granuloma annulare    History of hiatal hernia    History of kidney stones    Litthotrispy   Hyperlipidemia    Hypertension    Neuropathy    Obesity    OSA (obstructive sleep apnea)    Right sided weakness    Stroke (HCC) 10/2017   Weakness right arm and leg   Wears dentures    top   Wears glasses     Past Surgical History:  Procedure Laterality Date   BLEPHAROPLASTY     CARDIAC CATHETERIZATION  8119,1478   X 2 stents   CHOLECYSTECTOMY N/A 09/23/2014   Procedure: LAPAROSCOPIC CHOLECYSTECTOMY WITH INTRAOPERATIVE CHOLANGIOGRAM;  Surgeon: Chevis Pretty [name], MD;  Location: [facility] OR;  Service: General;  Laterality: N/A;   COLONOSCOPY     COLONOSCOPY WITH PROPOFOL N/A 03/22/2016   Procedure: COLONOSCOPY WITH PROPOFOL;  Surgeon: Charlott Rakes, MD;  Location: [facility] ENDOSCOPY;  Service: Endoscopy;  Laterality: N/A;   ENDARTERECTOMY Left 10/19/2017   Procedure: ENDARTERECTOMY CAROTID LEFT;  Surgeon: Larina Earthly, MD;  Location: [facility] OR;  Service: Vascular;  Laterality: Left;  ESOPHAGOGASTRODUODENOSCOPY (EGD) WITH PROPOFOL N/A 03/22/2016   Procedure: ESOPHAGOGASTRODUODENOSCOPY (EGD) WITH PROPOFOL;  Surgeon: Charlott Rakes, MD;  Location: [facility] ENDOSCOPY;  Service: Endoscopy;  Laterality: N/A;   EYE SURGERY     both cataracts   KNEE ARTHROSCOPY WITH MEDIAL MENISECTOMY Right 08/19/2013   Procedure: RIGHT KNEE ARTHROSCOPY WITH PARTIAL MEDIAL MENISECTOMY AND CHONDROPLASTY;  Surgeon: Velna Ochs, MD;  Location: [facility];  Service: Orthopedics;  Laterality: Right;   LEFT HEART CATH AND CORONARY ANGIOGRAPHY N/A 06/28/2016   Procedure: Left Heart Cath and Coronary Angiography;  Surgeon: Lyn Records, MD;  Location: [facility] INVASIVE CV LAB;  Service: Cardiovascular;  Laterality: N/A;   LEFT HEART CATH AND CORONARY ANGIOGRAPHY N/A 02/25/2019   Procedure: LEFT HEART CATH AND CORONARY ANGIOGRAPHY;  Surgeon: Lyn Records, MD;  Location: [facility] INVASIVE CV LAB;  Service: Cardiovascular;  Laterality: N/A;   LEFT HEART CATHETERIZATION WITH CORONARY ANGIOGRAM N/A 10/03/2011   Procedure: LEFT HEART CATHETERIZATION WITH CORONARY ANGIOGRAM;  Surgeon: Lesleigh Noe, MD;  Location: [facility] CATH LAB;  Service: Cardiovascular;  Laterality: N/A;   LEFT HEART CATHETERIZATION WITH CORONARY ANGIOGRAM N/A 09/12/2012   Procedure: LEFT HEART CATHETERIZATION WITH CORONARY ANGIOGRAM;  Surgeon: Lesleigh Noe, MD;  Location: [facility] CATH LAB;  Service: Cardiovascular;  Laterality: N/A;   LIPOMA EXCISION     Biospy only; left parotid gland   LITHOTRIPSY     none     PERCUTANEOUS CORONARY STENT INTERVENTION (PCI-S) N/A 09/17/2012   Procedure: PERCUTANEOUS CORONARY STENT INTERVENTION (PCI-S);  Surgeon: Lesleigh Noe, MD;  Location: [facility] CATH LAB;  Service: Cardiovascular;  Laterality: N/A;   TRIGGER FINGER RELEASE Left 12/21/2015   Procedure: LEFT LONG FINGER TRIGGER RELEASE ;  Surgeon: Mack Hook, MD;  Location: [facility];  Service: Orthopedics;  Laterality: Left;     reports that he quit smoking about 11 years ago. His smoking use included cigarettes. He has a 64.00 pack-year smoking history. He has never used smokeless tobacco. He reports current alcohol use. He reports that he does not use drugs.  Allergies  Allergen Reactions   Cimetidine Other (See Comments)    Anxiety Attacks   Metformin Hcl     Other reaction(s): decreased kidney function    Family History  Problem Relation Age of Onset   Aortic aneurysm Father    Aneurysm Mother        brain   Cancer Brother    COPD Sister    Other Sister        health hx unknown   Other Sister        health hx unknown   Other Brother         health hx unknown     Prior to Admission medications   Medication Sig Start Date End Date Taking? Authorizing Provider  allopurinol (ZYLOPRIM) 100 MG tablet Take 100 mg by mouth daily.   Yes [provider]  amLODipine (NORVASC) 5 MG tablet Take 5 mg by mouth daily.   Yes [provider]  aspirin EC 81 MG tablet Take 1 tablet (81 mg total) by mouth daily. 02/20/19  Yes Lyn Records, MD  atorvastatin (LIPITOR) 80 MG tablet Take 1 tablet (80 mg total) by mouth daily at 6 PM. 10/15/17  Yes Leroy Sea, MD  capsaicin (ZOSTRIX) 0.025 % cream Apply topically 2 (two) times daily.   Yes [provider]  carvedilol (COREG) 3.125 MG tablet Take 3.125  mg by mouth 2 (two) times daily with a meal.   Yes [provider]  cholecalciferol (VITAMIN D3) 25 MCG (1000 UT) tablet Take 1,000 Units by mouth daily.   Yes [provider]  clopidogrel (PLAVIX) 75 MG tablet Take 1 tablet (75 mg total) by mouth daily. 10/16/17  Yes Leroy Sea, MD  dapagliflozin propanediol (FARXIGA) 10 MG TABS tablet Take 1 tablet (10 mg total) by mouth daily before breakfast. 05/31/21  Yes Lyn Records, MD  docusate sodium (COLACE) 100 MG capsule Take 100 mg by mouth 2 (two) times daily.   Yes [provider]  Ferrous Sulfate (IRON) 325 (65 FE) MG TABS Take 325 mg by mouth daily.    Yes [provider]  finasteride (PROSCAR) 5 MG tablet Take 1 tablet (5 mg total) by mouth daily. 11/29/11  Yes Oretha Milch, MD  gabapentin (NEURONTIN) 300 MG capsule Take 1 capsule (300 mg total) by mouth at bedtime. 08/20/18  Yes McCue, Shanda Bumps, NP  glipiZIDE (GLUCOTROL) 5 MG tablet Take 5 mg by mouth 2 (two) times daily before a meal.    Yes [provider]  guaifenesin (HUMIBID E) 400 MG TABS tablet Take 400 mg by mouth at bedtime.   Yes [provider]  isosorbide mononitrate (IMDUR) 120 MG 24 hr tablet Take 1 tablet (120 mg total) by mouth daily. 11/20/14  Yes  Lyn Records, MD  losartan (COZAAR) 25 MG tablet Take 0.5 mg by mouth 2 (two) times daily.   Yes [provider]  pantoprazole (PROTONIX) 40 MG tablet Take 40 mg by mouth daily.   Yes [provider]  primidone (MYSOLINE) 50 MG tablet Take 50 mg by mouth at bedtime.    Yes [provider]  sucralfate (CARAFATE) 1 GM/10ML suspension Take 1 g by mouth 2 (two) times daily.   Yes [provider]  traMADol (ULTRAM) 50 MG tablet Take 1 tablet (50 mg total) by mouth at bedtime as needed for moderate pain. Patient taking differently: Take 50 mg by mouth 2 (two) times daily. Take 1 tablet by mouth at 6pm, and 1 at bedtime. 10/20/17  Yes Rhyne, Ames Coupe, PA-C  vitamin B-12 (CYANOCOBALAMIN) 1000 MCG tablet Take 1,000 mcg by mouth daily.   Yes [provider]  albuterol (PROVENTIL) (2.5 MG/3ML) 0.083% nebulizer solution Take 2.5 mg by nebulization every 6 (six) hours as needed for wheezing or shortness of breath.     [provider]  albuterol (VENTOLIN HFA) 108 (90 Base) MCG/ACT inhaler Inhale 2 puffs into the lungs every 6 (six) hours as needed for wheezing or shortness of breath.     [provider]  nitroGLYCERIN (NITROSTAT) 0.4 MG SL tablet Place 0.4 mg under the tongue every 5 (five) minutes as needed for chest pain.    [provider]    Physical Exam: Vitals:   08/10/22 1530 08/10/22 1545 08/10/22 1600 08/10/22 1615  BP: (!) 161/93 (!) 168/98 137/89 (!) 163/139  Pulse: 92 87 92 93  Resp: 17 20 (!) 24 19  Temp:      TempSrc:      SpO2: 93% 93% 94% 94%  Weight:      Height:        Constitutional: NAD, calm, comfortable Vitals:   08/10/22 1530 08/10/22 1545 08/10/22 1600 08/10/22 1615  BP: (!) 161/93 (!) 168/98 137/89 (!) 163/139  Pulse: 92 87 92 93  Resp: 17 20 (!) 24 19  Temp:  TempSrc:      SpO2: 93% 93% 94% 94%  Weight:      Height:       Eyes: PERRL, lids and conjunctivae normal ENMT: Mucous membranes  are moist. Posterior pharynx clear of any exudate or lesions.Normal dentition.  Neck: normal, supple, no masses, no thyromegaly Respiratory: clear to auscultation bilaterally, no wheezing, no crackles. Normal respiratory effort. No accessory muscle use.  Cardiovascular: Regular rate and rhythm, no murmurs / rubs / gallops. No extremity edema. 2+ pedal pulses. No carotid bruits.  Abdomen: no tenderness, no masses palpated. No hepatosplenomegaly. Bowel sounds positive.  Musculoskeletal: Left leg rotated and shortened Skin: no rashes, lesions, ulcers. No induration Neurologic: CN 2-12 grossly intact. Sensation intact, DTR normal. Strength 5/5 in all 4.  Psychiatric: Normal judgment and insight. Alert and oriented x 3. Normal mood.     Labs on Admission: I have personally reviewed following labs and imaging studies  CBC: Recent Labs  Lab 08/10/22 1340 08/10/22 1441  WBC 7.8  --   NEUTROABS 5.7  --   HGB 15.0 15.3  HCT 45.0 45.0  MCV 87.9  --   PLT 244  --    Basic Metabolic Panel: Recent Labs  Lab 08/10/22 1340 08/10/22 1441  NA 137 140  K 4.5 4.5  CL 105 106  CO2 22  --   GLUCOSE 113* 110*  BUN 54* 51*  CREATININE 2.58* 2.90*  CALCIUM 9.3  --    GFR: Estimated Creatinine Clearance: 19.9 mL/min (A) (by C-G formula based on SCr of 2.9 mg/dL (H)). Liver Function Tests: No results for input(s): "AST", "ALT", "ALKPHOS", "BILITOT", "PROT", "ALBUMIN" in the last 168 hours. No results for input(s): "LIPASE", "AMYLASE" in the last 168 hours. No results for input(s): "AMMONIA" in the last 168 hours. Coagulation Profile: Recent Labs  Lab 08/10/22 1340  INR 1.0   Cardiac Enzymes: No results for input(s): "CKTOTAL", "CKMB", "CKMBINDEX", "TROPONINI" in the last 168 hours. BNP (last 3 results) No results for input(s): "PROBNP" in the last 8760 hours. HbA1C: No results for input(s): "HGBA1C" in the last 72 hours. CBG: No results for input(s): "GLUCAP" in the last 168  hours. Lipid Profile: No results for input(s): "CHOL", "HDL", "LDLCALC", "TRIG", "CHOLHDL", "LDLDIRECT" in the last 72 hours. Thyroid Function Tests: No results for input(s): "TSH", "T4TOTAL", "FREET4", "T3FREE", "THYROIDAB" in the last 72 hours. Anemia Panel: No results for input(s): "VITAMINB12", "FOLATE", "FERRITIN", "TIBC", "IRON", "RETICCTPCT" in the last 72 hours. Urine analysis:    Component Value Date/Time   COLORURINE YELLOW (A) 10/19/2017 1008   APPEARANCEUR CLEAR (A) 10/19/2017 1008   LABSPEC 1.025 10/19/2017 1008   PHURINE 5.0 10/19/2017 1008   GLUCOSEU NEGATIVE 10/19/2017 1008   HGBUR NEGATIVE 10/19/2017 1008   BILIRUBINUR NEGATIVE 10/19/2017 1008   KETONESUR NEGATIVE 10/19/2017 1008   PROTEINUR 30 (A) 10/19/2017 1008   NITRITE NEGATIVE 10/19/2017 1008   LEUKOCYTESUR NEGATIVE 10/19/2017 1008    Radiological Exams on Admission: DG Wrist Complete Left  Result Date: 08/10/2022 CLINICAL DATA:  Trauma, fall EXAM: LEFT WRIST - COMPLETE 3+ VIEW COMPARISON:  None Available. FINDINGS: No recent fracture or dislocation is seen. Subcortical cyst is seen in lunate. Bony spurs are seen in first carpometacarpal and first metacarpophalangeal joints. Arterial calcifications are seen in soft tissues. IMPRESSION: No recent fracture or dislocation is seen in left wrist. Electronically Signed   By: Ernie Avena M.D.   On: 08/10/2022 15:49   DG Chest Portable 1 View  Result Date:  08/10/2022 CLINICAL DATA:  Trauma, fall EXAM: PORTABLE CHEST 1 VIEW COMPARISON:  02/23/2019 FINDINGS: Cardiac size is in upper limits of normal. There are no signs of pulmonary edema or focal pulmonary consolidation. Patchy infiltrate in right lower lung field seen in the earlier CT could not be clearly identified on the radiograph. There is no pleural effusion or pneumothorax. Air soft tissue interface seen in the lateral aspect of left lower lung field may be due to skin fold. IMPRESSION: There are no signs of  pulmonary edema or focal pulmonary consolidation. Electronically Signed   By: Ernie Avena M.D.   On: 08/10/2022 15:45   CT CHEST ABDOMEN PELVIS WO CONTRAST  Result Date: 08/10/2022 CLINICAL DATA:  Trauma, fall EXAM: CT CHEST, ABDOMEN AND PELVIS WITHOUT CONTRAST TECHNIQUE: Multidetector CT imaging of the chest, abdomen and pelvis was performed following the standard protocol without IV contrast. RADIATION DOSE REDUCTION: This exam was performed according to the departmental dose-optimization program which includes automated exposure control, adjustment of the mA and/or kV according to patient size and/or use of iterative reconstruction technique. COMPARISON:  CT pelvis done on 02/12/2021, CT abdomen and pelvis done on 10/17/2012 FINDINGS: CT CHEST FINDINGS Cardiovascular: Coronary artery calcifications are seen. Scattered calcifications are seen in thoracic aorta and its major branches. Mediastinum/Nodes: No acute findings are seen. Lungs/Pleura: There are patchy alveolar infiltrates in posteromedial right lower lung field. Small linear densities are seen in left lower lung field. There is no pleural effusion or pneumothorax. Musculoskeletal: No acute findings are seen in bony structures in the thorax. CT ABDOMEN PELVIS FINDINGS Hepatobiliary: Surgical clips are seen in gallbladder fossa. There is no dilation of bile ducts. Pancreas: No focal abnormalities are seen. Spleen: Unremarkable. Adrenals/Urinary Tract: Adrenals are unremarkable. There is no hydronephrosis. There are scattered calcifications in renal artery branches. No definite renal or ureteral stones are seen. Urinary bladder is unremarkable. Stomach/Bowel: Stomach is unremarkable. Small bowel loops are not dilated. Appendix is not dilated. There is no significant wall thickening in colon. Multiple diverticula are seen in colon without signs of focal acute diverticulitis. Vascular/Lymphatic: Arterial calcifications are seen in aorta and its  major branches. There is a 3 x 2.3 cm aneurysm in the right common iliac artery. There is 2.3 x 2.2 cm aneurysm in the left common iliac artery. Reproductive: Prostate is enlarged projecting into the base of the bladder. There are scattered calcifications in prostate. Other: There is no ascites or pneumoperitoneum. Small umbilical hernia containing fat is seen. Small bilateral inguinal hernias containing fat are noted. Musculoskeletal: There is comminuted intertrochanteric fracture in proximal left femur. There is no dislocation. Degenerative changes are noted in both hips. There is large Schmorl's node in the upper endplate of body of L4 vertebra. Degenerative changes are noted in thoracic and lumbar spine. IMPRESSION: Recent comminuted intertrochanteric fracture is seen in proximal left femur. No other definite recent fractures are seen. There is large Schmorl's node in the upper endplate of body of L4 vertebra. Degenerative changes are noted in thoracic and lumbar spine with bony spurs. Patchy alveolar infiltrate is seen in right lower lobe suggesting atelectasis/pneumonia. Small linear densities in left lower lung field may suggest scarring or subsegmental atelectasis. There is no evidence of hematoma in mediastinum and retroperitoneal region. There is no demonstrable laceration in solid organs. There is no bowel wall thickening. Coronary artery disease. Aortic atherosclerosis. Aneurysmal dilation is seen in both common iliac arteries, larger on the right side. Diverticulosis of colon without signs of focal  diverticulitis. Enlarged prostate. Electronically Signed   By: Ernie Avena M.D.   On: 08/10/2022 15:23   CT Head Wo Contrast  Result Date: 08/10/2022 CLINICAL DATA:  Head trauma, moderate-severe; Neck trauma (Age >= 65y). Mechanical fall. EXAM: CT HEAD WITHOUT CONTRAST CT CERVICAL SPINE WITHOUT CONTRAST TECHNIQUE: Multidetector CT imaging of the head and cervical spine was performed following the  standard protocol without intravenous contrast. Multiplanar CT image reconstructions of the cervical spine were also generated. RADIATION DOSE REDUCTION: This exam was performed according to the departmental dose-optimization program which includes automated exposure control, adjustment of the mA and/or kV according to patient size and/or use of iterative reconstruction technique. COMPARISON:  Head CT 10/17/2017. FINDINGS: CT HEAD FINDINGS Brain: No acute hemorrhage. Unchanged moderate chronic small-vessel disease. Cortical gray-white differentiation is otherwise preserved. Prominence of the ventricles and sulci within expected range for age. No hydrocephalus or extra-axial collection. No mass effect or midline shift. Vascular: No hyperdense vessel or unexpected calcification. Skull: No calvarial fracture or suspicious bone lesion. Skull base is unremarkable. Sinuses/Orbits: Unremarkable. Other: None. CT CERVICAL SPINE FINDINGS Alignment: 3 mm degenerative anterolisthesis of C7 on T1. No traumatic malalignment. Skull base and vertebrae: No acute fracture. Normal craniocervical junction. No suspicious bone lesions. Soft tissues and spinal canal: No prevertebral fluid or swelling. No visible canal hematoma. Disc levels: Multilevel lower cervical spondylosis and ossification of the posterior longitudinal ligament, worst at C4-5, where there is at least moderate spinal canal stenosis. Upper chest: Unremarkable. Other: None. IMPRESSION: 1. No acute intracranial abnormality. 2. No acute fracture or traumatic malalignment of the cervical spine. 3. Multilevel lower cervical spondylosis and ossification of the posterior longitudinal ligament, worst at C4-5, where there is at least moderate spinal canal stenosis. Electronically Signed   By: Orvan Falconer M.D.   On: 08/10/2022 15:05   CT Cervical Spine Wo Contrast  Result Date: 08/10/2022 CLINICAL DATA:  Head trauma, moderate-severe; Neck trauma (Age >= 65y). Mechanical  fall. EXAM: CT HEAD WITHOUT CONTRAST CT CERVICAL SPINE WITHOUT CONTRAST TECHNIQUE: Multidetector CT imaging of the head and cervical spine was performed following the standard protocol without intravenous contrast. Multiplanar CT image reconstructions of the cervical spine were also generated. RADIATION DOSE REDUCTION: This exam was performed according to the departmental dose-optimization program which includes automated exposure control, adjustment of the mA and/or kV according to patient size and/or use of iterative reconstruction technique. COMPARISON:  Head CT 10/17/2017. FINDINGS: CT HEAD FINDINGS Brain: No acute hemorrhage. Unchanged moderate chronic small-vessel disease. Cortical gray-white differentiation is otherwise preserved. Prominence of the ventricles and sulci within expected range for age. No hydrocephalus or extra-axial collection. No mass effect or midline shift. Vascular: No hyperdense vessel or unexpected calcification. Skull: No calvarial fracture or suspicious bone lesion. Skull base is unremarkable. Sinuses/Orbits: Unremarkable. Other: None. CT CERVICAL SPINE FINDINGS Alignment: 3 mm degenerative anterolisthesis of C7 on T1. No traumatic malalignment. Skull base and vertebrae: No acute fracture. Normal craniocervical junction. No suspicious bone lesions. Soft tissues and spinal canal: No prevertebral fluid or swelling. No visible canal hematoma. Disc levels: Multilevel lower cervical spondylosis and ossification of the posterior longitudinal ligament, worst at C4-5, where there is at least moderate spinal canal stenosis. Upper chest: Unremarkable. Other: None. IMPRESSION: 1. No acute intracranial abnormality. 2. No acute fracture or traumatic malalignment of the cervical spine. 3. Multilevel lower cervical spondylosis and ossification of the posterior longitudinal ligament, worst at C4-5, where there is at least moderate spinal canal stenosis. Electronically Signed  By: Orvan Falconer M.D.    On: 08/10/2022 15:05   DG Femur Min 2 Views Left  Result Date: 08/10/2022 CLINICAL DATA:  Fall, left leg pain EXAM: LEFT FEMUR 2 VIEWS COMPARISON:  Pelvic radiographs 08/10/2022 FINDINGS: Acute intertrochanteric fracture of the left proximal femur. No significant angulation. Vascular calcifications noted. IMPRESSION: 1. Acute intertrochanteric fracture of the left proximal femur. Electronically Signed   By: Gaylyn Rong M.D.   On: 08/10/2022 14:42   DG Elbow Complete Right  Result Date: 08/10/2022 CLINICAL DATA:  Fall, right elbow injury EXAM: RIGHT ELBOW - COMPLETE 3+ VIEW COMPARISON:  None Available. FINDINGS: IV tubing noted. Mild epicondylar spurring medially and laterally. Mild spurring along the coronoid process. Accounting for obliquity, no elbow joint effusion or visible fracture. Supinator fat pad unremarkable. IMPRESSION: 1. Mild degenerative findings. No fracture or joint effusion identified. Electronically Signed   By: Gaylyn Rong M.D.   On: 08/10/2022 14:40   DG Pelvis Portable  Result Date: 08/10/2022 CLINICAL DATA:  [place]  Left leg pain. EXAM: PORTABLE PELVIS 1-2 VIEWS COMPARISON:  CT pelvis 09/12/2020 FINDINGS: Mild axial articular space narrowing in both hips. Spurring and subcortical cyst formation along the left upper acetabulum. No appreciable fracture or acute bony findings. Vascular calcifications noted. IMPRESSION: 1. Mild degenerative findings in both hips. No acute findings. Electronically Signed   By: Gaylyn Rong M.D.   On: 08/10/2022 14:39    EKG: Independently reviewed.  Sinus, nonischemic  Assessment/Plan Principal Problem:   Hip fracture (HCC) Active Problems:   Left displaced femoral neck fracture (HCC)   CAD S/P percutaneous coronary angioplasty  (please populate well all problems here in Problem List. (For example, if patient is on BP meds at home and you resume or decide to hold them, it is a problem that needs to be her. Same  for CAD, COPD, HLD and so on)  Left intertrochanteric femoral fracture -ORIF tomorrow -Consult anesthesiology for local neuro block -Alternate Dilaudid and p.o. oxycodone -Perioperative cardiac risk stratification, discussed with on-call cardiology, unclear patient's recent chest pains symptoms related to baseline CAD.  Cardiology will help [place] to stratify patient's cardiac risk.  Echocardiogram ordered by cardiology  History of CAD -As above -As per requested by orthopedic surgery, will hold off Plavix for now -Continue aspirin and statin and beta-blocker  CKD stage IIIb -Euvolemic, creatinine at baseline  IIDM -SSI for now  COPD -Stable  GERD -Poorly controlled, continue PPI and sucralfate   DVT prophylaxis: Lovenox Code Status: Full code Family Communication: Son at bedside Disposition Plan: Patient is sick with left hip fracture requiring ORIF, expect more than 2 midnight hospital stay Consults called: Orthopedic surgery and cardiology Admission status: MedSurg admission   Emeline General MD [facility] Pager [phone]  08/10/2022, 4:32 PM

## 2022-08-10 NOTE — Consult Note (Addendum)
Cardiology Consultation   Patient ID: ALASTAIR HENNES MRN: [id number]; DOB: [birth year]  Admit date: 08/10/2022 Date of Consult: 08/10/2022  PCP:  Daisy Floro, MD   [facility] Providers Cardiologist:  Donato Loretto, MD        Patient Profile:   KHADEN GATER is a 88 y.o. male with a hx of  CAD (prior Cx stenting in 2007 and 2012, DES to prox Cx 2014), chronic diastolic CHF, hyperlipidemia, mild AS by echo 2019, HTN, obesity, OSA, DM, hiatal hernia, neuropathy, GERD, CKD stage IV, CVA related to carotid disease s/p L CEA 10/2017  who is being seen 08/10/2022 for the evaluation of preop risk stratification at the request of Dr. Chipper Herb.  History of Present Illness:   Mr. Rivenburg was a previous patient of Dr. Katrinka Blazing but recently established care with Dr. Anne Fu last month and was felt to be doing well, continued on DAPT. He had reported some chronic unchanged dyspnea but no concerning chest pain. Last echo 2019 at time of CVA showed EF 55-60%, mild LVH, G1DD, elevated LVEDP, mild AS, mild LAE.  He has h/o PCIs as above, with last cath 2020 showing diffuse CAD with findings similar to 2018, possibly diffusely diseased area in the first OM might be slightly worse (report outlined fully below), medical therapy recommended.  [place] when he missed a wooden step and fell down onto his left side with resultant left buttock and hip pain. Imaging workup showed left femur fracture, recommended by ortho for repair in the AM. He reports he has not had any concerning cardiac symptoms. He goes to the gym 2 days a week doing many strength training exercises and plays golf regularly as well. He denies any recent worsening SOB. He denies any chest pain. No recent palpitations, syncope, orthopnea, edema, or any other new cardiac symptoms. Here, SBP 160s-170s systolic but in setting of significant hip pain. EKG NSR no acute STT changes.   Past Medical History:  Diagnosis Date    Anemia    low iron   Aortic stenosis    Arthritis    BPH (benign prostatic hyperplasia)    Bruises easily    CAD (coronary artery disease)    Chronic diastolic CHF (congestive heart failure) (HCC)    CKD (chronic kidney disease), stage [name] (HCC)    Claustrophobia    Complication of anesthesia    has to have head elevated to keep from strangling on post nasal drip   COPD (chronic obstructive pulmonary disease) (HCC)    Diabetes mellitus type II    type 2   Diarrhea 2015   had for a year and a half   Dyspnea    GERD (gastroesophageal reflux disease)    Gout    Granuloma annulare    History of hiatal hernia    History of kidney stones    Litthotrispy   Hyperlipidemia    Hypertension    Neuropathy    Obesity    OSA (obstructive sleep apnea)    Right sided weakness    Stroke (HCC) 10/2017   Weakness right arm and leg   Wears dentures    top   Wears glasses     Past Surgical History:  Procedure Laterality Date   BLEPHAROPLASTY     CARDIAC CATHETERIZATION  4098,1191   X 2 stents   CHOLECYSTECTOMY N/A 09/23/2014   Procedure: LAPAROSCOPIC CHOLECYSTECTOMY WITH INTRAOPERATIVE CHOLANGIOGRAM;  Surgeon: Chevis Pretty [name], MD;  Location: [facility] OR;  Service: General;  Laterality: N/A;   COLONOSCOPY     COLONOSCOPY WITH PROPOFOL N/A 03/22/2016   Procedure: COLONOSCOPY WITH PROPOFOL;  Surgeon: Charlott Rakes, MD;  Location: [facility] ENDOSCOPY;  Service: Endoscopy;  Laterality: N/A;   ENDARTERECTOMY Left 10/19/2017   Procedure: ENDARTERECTOMY CAROTID LEFT;  Surgeon: Larina Earthly, MD;  Location[facility] OR;  Service: Vascular;  Laterality: Left;   ESOPHAGOGASTRODUODENOSCOPY (EGD) WITH PROPOFOL N/A 03/22/2016   Procedure: ESOPHAGOGASTRODUODENOSCOPY (EGD) WITH PROPOFOL;  Surgeon: Charlott Rakes, MD;  Location: [facility] ENDOSCOPY;  Service: Endoscopy;  Laterality: N/A;   EYE SURGERY     both cataracts   KNEE ARTHROSCOPY WITH MEDIAL MENISECTOMY Right 08/19/2013   Procedure: RIGHT KNEE ARTHROSCOPY WITH  PARTIAL MEDIAL MENISECTOMY AND CHONDROPLASTY;  Surgeon: Velna Ochs, MD;  Location: [facility];  Service: Orthopedics;  Laterality: Right;   LEFT HEART CATH AND CORONARY ANGIOGRAPHY N/A 06/28/2016   Procedure: Left Heart Cath and Coronary Angiography;  Surgeon: Lyn Records, MD;  Location: [facility] INVASIVE CV LAB;  Service: Cardiovascular;  Laterality: N/A;   LEFT HEART CATH AND CORONARY ANGIOGRAPHY N/A 02/25/2019   Procedure: LEFT HEART CATH AND CORONARY ANGIOGRAPHY;  Surgeon: Lyn Records, MD;  Location: [facility] INVASIVE CV LAB;  Service: Cardiovascular;  Laterality: N/A;   LEFT HEART CATHETERIZATION WITH CORONARY ANGIOGRAM N/A 10/03/2011   Procedure: LEFT HEART CATHETERIZATION WITH CORONARY ANGIOGRAM;  Surgeon: Lesleigh Noe, MD;  Location: [facility] CATH LAB;  Service: Cardiovascular;  Laterality: N/A;   LEFT HEART CATHETERIZATION WITH CORONARY ANGIOGRAM N/A 09/12/2012   Procedure: LEFT HEART CATHETERIZATION WITH CORONARY ANGIOGRAM;  Surgeon: Lesleigh Noe, MD;  Location: [facility] CATH LAB;  Service: Cardiovascular;  Laterality: N/A;   LIPOMA EXCISION     Biospy only; left parotid gland   LITHOTRIPSY     none     PERCUTANEOUS CORONARY STENT INTERVENTION (PCI-S) N/A 09/17/2012   Procedure: PERCUTANEOUS CORONARY STENT INTERVENTION (PCI-S);  Surgeon: Lesleigh Noe, MD;  Location: [facility] CATH LAB;  Service: Cardiovascular;  Laterality: N/A;   TRIGGER FINGER RELEASE Left 12/21/2015   Procedure: LEFT LONG FINGER TRIGGER RELEASE ;  Surgeon: Mack Hook, MD;  Location: [facility];  Service: Orthopedics;  Laterality: Left;     Home Medications:  Prior to Admission medications   Medication Sig Start Date End Date Taking? Authorizing Provider  albuterol (PROVENTIL) (2.5 MG/3ML) 0.083% nebulizer solution Take 2.5 mg by nebulization every 6 (six) hours as needed for wheezing or shortness of breath.    Yes [provider]  albuterol (VENTOLIN HFA) 108 (90 Base) MCG/ACT  inhaler Inhale 2 puffs into the lungs every 6 (six) hours as needed for wheezing or shortness of breath.    Yes [provider]  allopurinol (ZYLOPRIM) 100 MG tablet Take 100 mg by mouth daily.   Yes [provider]  amLODipine (NORVASC) 5 MG tablet Take 5 mg by mouth daily.   Yes [provider]  aspirin EC 81 MG tablet Take 1 tablet (81 mg total) by mouth daily. 02/20/19  Yes Lyn Records, MD  atorvastatin (LIPITOR) 80 MG tablet Take 1 tablet (80 mg total) by mouth daily at 6 PM. 10/15/17  Yes Leroy Sea, MD  carvedilol (COREG) 3.125 MG tablet Take 3.125 mg by mouth 2 (two) times daily with a meal.   Yes [provider]  cholecalciferol (VITAMIN D3) 25 MCG (1000 UT) tablet Take 1,000 Units by mouth daily.  Yes [provider]  clopidogrel (PLAVIX) 75 MG tablet Take 1 tablet (75 mg total) by mouth daily. 10/16/17  Yes Leroy Sea, MD  dapagliflozin propanediol (FARXIGA) 10 MG TABS tablet Take 1 tablet (10 mg total) by mouth daily before breakfast. 05/31/21  Yes Lyn Records, MD  Ferrous Sulfate (IRON) 325 (65 FE) MG TABS Take 325 mg by mouth daily.    Yes [provider]  finasteride (PROSCAR) 5 MG tablet Take 1 tablet (5 mg total) by mouth daily. 11/29/11  Yes Oretha Milch, MD  gabapentin (NEURONTIN) 300 MG capsule Take 1 capsule (300 mg total) by mouth at bedtime. 08/20/18  Yes McCue, Shanda Bumps, NP  glipiZIDE (GLUCOTROL) 5 MG tablet Take 5 mg by mouth 2 (two) times daily before a meal.    Yes [provider]  guaifenesin (HUMIBID E) 400 MG TABS tablet Take 400 mg by mouth at bedtime.   Yes [provider]  isosorbide mononitrate (IMDUR) 120 MG 24 hr tablet Take 1 tablet (120 mg total) by mouth daily. 11/20/14  Yes Lyn Records, MD  losartan (COZAAR) 25 MG tablet Take 0.5 mg by mouth 2 (two) times daily.   Yes [provider]  pantoprazole (PROTONIX) 40 MG tablet Take 40 mg by mouth daily.   Yes [provider]  primidone (MYSOLINE) 50 MG tablet Take 50 mg by mouth at bedtime.    Yes [provider]  nitroGLYCERIN (NITROSTAT) 0.4 MG SL tablet Place 0.4 mg under the tongue every 5 (five) minutes as needed for chest pain.    [provider]  nystatin (MYCOSTATIN/NYSTOP) powder Apply topically. Patient not taking: Reported on 08/10/2022 10/15/19   [provider]  sucralfate (CARAFATE) 1 GM/10ML suspension Take 1 g by mouth 2 (two) times daily.    [provider]  traMADol (ULTRAM) 50 MG tablet Take 1 tablet (50 mg total) by mouth at bedtime as needed for moderate pain. Patient taking differently: Take 50 mg by mouth 2 (two) times daily. Take 1 tablet by mouth at 6pm, and 1 at bedtime. 10/20/17   Rhyne, Ames Coupe, PA-C  vitamin B-12 (CYANOCOBALAMIN) 1000 MCG tablet Take 1,000 mcg by mouth daily.    [provider]    Inpatient Medications: Scheduled Meds:  fentaNYL (SUBLIMAZE) injection  50 mcg Intravenous Once   insulin aspart  0-9 Units Subcutaneous TID WC   Continuous Infusions:  PRN Meds:   Allergies:    Allergies  Allergen Reactions   Cimetidine Other (See Comments)    Anxiety Attacks   Metformin Hcl     Other reaction(s): decreased kidney function    Social History:   Social History   Socioeconomic History   Marital status: Married    Spouse name: Not on file   Number of children: Not on file   Years of education: Not on file   Highest education level: Not on file  Occupational History   Occupation: retired  Tobacco Use   Smoking status: Former    Packs/day: 1.00    Years: 64.00    Additional pack years: 0.00    Total pack years: 64.00    Types: Cigarettes    Quit date: 05/29/2011    Years since quitting: 11.2   Smokeless tobacco: Never  Vaping Use   Vaping Use: Never used  Substance and Sexual Activity   Alcohol use: Yes    Comment: rarely   Drug use: No   Sexual activity: Not Currently  Other Topics Concern    Not on file  Social History Narrative   Denies Caffeine use    Social Determinants of Health   Financial Resource Strain: Not on file  Food Insecurity: Not on file  Transportation Needs: Not on file  Physical Activity: Not on file  Stress: Not on file  Social Connections: Not on file  Intimate Partner Violence: Not on file    Family History:    Family History  Problem Relation Age of Onset   Aortic aneurysm Father    Aneurysm Mother        brain   Cancer Brother    COPD Sister    Other Sister        health hx unknown   Other Sister        health hx unknown   Other Brother        health hx unknown     ROS:  Please see the history of present illness.   All other ROS reviewed and negative.     Physical Exam/Data:   Vitals:   08/10/22 1342 08/10/22 1345 08/10/22 1400 08/10/22 1500  BP:  (!) 169/84 (!) 164/113 (!) 176/95  Pulse:  86 90 93  Resp:  18 20 (!) 21  Temp:      TempSrc:      SpO2:  95% 95% 93%  Weight: 86.6 kg     Height: 5\' 8"  (1.727 m)      No intake or output data in the 24 hours ending 08/10/22 1605    08/10/2022    1:42 PM 07/18/2022   11:27 AM 01/16/2022   11:29 AM  Last 3 Weights  Weight (lbs) 191 lb 200 lb 206 lb 6.4 oz  Weight (kg) 86.637 kg 90.719 kg 93.622 kg     Body mass index is 29.04 kg/m.  General: Well developed, well nourished WM, in no acute distress. Head: Normocephalic, atraumatic, sclera non-icteric, no xanthomas, nares are without discharge. Neck: Soft bilateral carotid bruits. JVP not elevated. Lungs: Clear bilaterally to auscultation without wheezes, rales, or rhonchi. Breathing is unlabored. Heart: RRR S1 S2, soft SEM RUSB, no rubs or gallops.  Abdomen: Soft, non-tender, non-distended with normoactive bowel sounds. No rebound/guarding. Extremities: No clubbing or cyanosis. No edema. Distal pedal pulses are 2+ and equal bilaterally. Neuro: Alert and oriented X 3. Moves all extremities spontaneously. Psych:  Responds to  questions appropriately with a normal affect.  EKG:  The EKG was personally reviewed and demonstrates:  NSR 89bpm, no acute STT changes  Telemetry:  Telemetry was personally reviewed and demonstrates:  NSR  Relevant CV Studies:  Cath 2020 Prox LAD to Mid LAD lesion is 55% stenosed. 1st Diag lesion is 85% stenosed.   Patent left main 50% mid LAD with 80% stenosis in the first diagonal (small caliber). Ostial circumflex 60 to 70%.  50 to 60% stenosis in the proximal stent.  Segmental 70% stenosis in the first obtuse marginal proximal to the distal first obtuse marginal stent which is widely patent.  Continuation of circumflex is small ending on 2 small obtuse marginal branches. Right coronary contains intermediate stenosis in the mid and distal segment of up to 60%.  No focal high-grade obstruction. Normal left ventricular end-diastolic pressure.  Contrast not given to minimize load. Findings are very similar to 2018.  The diffusely diseased area in the first obtuse marginal may be slightly worse.  Recommendations:  Continue medical therapy.  Circumflex disease is diffuse.  In absence  of definite change in overall anatomy, additional exposure to contrast is felt to be higher risk (acute kidney injury) than continuing medical therapy. If ambulates and remained stable, could discharge later today or in a.m.  Will need to have a follow-up basic metabolic panel in 48 hours.  Carotid artery disease 05/2021 Summary:  Right Carotid: Velocities in the right ICA are consistent with a 1-39% stenosis.  Left Carotid: The extracranial vessels were near-normal with only minimal wall  thickening or plaque.  Vertebrals:  Bilateral vertebral arteries demonstrate antegrade flow.  Subclavians: Normal flow hemodynamics were seen in bilateral subclavian arteries.  *See table(s) above for measurements and observations.  Suggest follow up study in 12 months.  Electronically signed by Charlton Haws MD on 05/20/2021  at 4:06:26 PM.   2D echo 2019 - Left ventricle: The cavity size was normal. There was mild    concentric hypertrophy. Systolic function was normal. The    estimated ejection fraction was in the range of 55% to 60%. Wall    motion was normal; there were no regional wall motion    abnormalities. Doppler parameters are consistent with abnormal    left ventricular relaxation (grade 1 diastolic dysfunction).    Doppler parameters are consistent with elevated ventricular    end-diastolic filling pressure.  - Aortic valve: There was mild stenosis. There was no    regurgitation.  - Left atrium: The atrium was mildly dilated.  - Right ventricle: The cavity size was normal. Wall thickness was    normal. Systolic function was normal.  - Right atrium: The atrium was normal in size.  - Tricuspid valve: There was no regurgitation.  - Pulmonary arteries: Systolic pressure was within the normal    range.  - Inferior vena cava: The vessel was normal in size.  - Pericardium, extracardiac: A trivial pericardial effusion was    identified.   Impressions:   - No cardiac source of emboli was indentified.   Laboratory Data:  High Sensitivity Troponin:  No results for input(s): "TROPONINIHS" in the last 720 hours.   Chemistry Recent Labs  Lab 08/10/22 1340 08/10/22 1441  NA 137 140  K 4.5 4.5  CL 105 106  CO2 22  --   GLUCOSE 113* 110*  BUN 54* 51*  CREATININE 2.58* 2.90*  CALCIUM 9.3  --   GFRNONAA 24*  --   ANIONGAP 10  --     No results for input(s): "PROT", "ALBUMIN", "AST", "ALT", "ALKPHOS", "BILITOT" in the last 168 hours. Lipids No results for input(s): "CHOL", "TRIG", "HDL", "LABVLDL", "LDLCALC", "CHOLHDL" in the last 168 hours.  Hematology Recent Labs  Lab 08/10/22 1340 08/10/22 1441  WBC 7.8  --   RBC 5.12  --   HGB 15.0 15.3  HCT 45.0 45.0  MCV 87.9  --   MCH 29.3  --   MCHC 33.3  --   RDW 13.8  --   PLT 244  --    Thyroid No results for input(s): "TSH", "FREET4" in  the last 168 hours.  BNPNo results for input(s): "BNP", "PROBNP" in the last 168 hours.  DDimer No results for input(s): "DDIMER" in the last 168 hours.   Radiology/Studies:  DG Wrist Complete Left  Result Date: 08/10/2022 CLINICAL DATA:  Trauma, fall EXAM: LEFT WRIST - COMPLETE 3+ VIEW COMPARISON:  None Available. FINDINGS: No recent fracture or dislocation is seen. Subcortical cyst is seen in lunate. Bony spurs are seen in first carpometacarpal and first metacarpophalangeal joints. Arterial  calcifications are seen in soft tissues. IMPRESSION: No recent fracture or dislocation is seen in left wrist. Electronically Signed   By: Ernie Avena M.D.   On: 08/10/2022 15:49   DG Chest Portable 1 View  Result Date: 08/10/2022 CLINICAL DATA:  Trauma, fall EXAM: PORTABLE CHEST 1 VIEW COMPARISON:  02/23/2019 FINDINGS: Cardiac size is in upper limits of normal. There are no signs of pulmonary edema or focal pulmonary consolidation. Patchy infiltrate in right lower lung field seen in the earlier CT could not be clearly identified on the radiograph. There is no pleural effusion or pneumothorax. Air soft tissue interface seen in the lateral aspect of left lower lung field may be due to skin fold. IMPRESSION: There are no signs of pulmonary edema or focal pulmonary consolidation. Electronically Signed   By: Ernie Avena M.D.   On: 08/10/2022 15:45   CT CHEST ABDOMEN PELVIS WO CONTRAST  Result Date: 08/10/2022 CLINICAL DATA:  Trauma, fall EXAM: CT CHEST, ABDOMEN AND PELVIS WITHOUT CONTRAST TECHNIQUE: Multidetector CT imaging of the chest, abdomen and pelvis was performed following the standard protocol without IV contrast. RADIATION DOSE REDUCTION: This exam was performed according to the departmental dose-optimization program which includes automated exposure control, adjustment of the mA and/or kV according to patient size and/or use of iterative reconstruction technique. COMPARISON:  CT pelvis done on  02/12/2021, CT abdomen and pelvis done on 10/17/2012 FINDINGS: CT CHEST FINDINGS Cardiovascular: Coronary artery calcifications are seen. Scattered calcifications are seen in thoracic aorta and its major branches. Mediastinum/Nodes: No acute findings are seen. Lungs/Pleura: There are patchy alveolar infiltrates in posteromedial right lower lung field. Small linear densities are seen in left lower lung field. There is no pleural effusion or pneumothorax. Musculoskeletal: No acute findings are seen in bony structures in the thorax. CT ABDOMEN PELVIS FINDINGS Hepatobiliary: Surgical clips are seen in gallbladder fossa. There is no dilation of bile ducts. Pancreas: No focal abnormalities are seen. Spleen: Unremarkable. Adrenals/Urinary Tract: Adrenals are unremarkable. There is no hydronephrosis. There are scattered calcifications in renal artery branches. No definite renal or ureteral stones are seen. Urinary bladder is unremarkable. Stomach/Bowel: Stomach is unremarkable. Small bowel loops are not dilated. Appendix is not dilated. There is no significant wall thickening in colon. Multiple diverticula are seen in colon without signs of focal acute diverticulitis. Vascular/Lymphatic: Arterial calcifications are seen in aorta and its major branches. There is a 3 x 2.3 cm aneurysm in the right common iliac artery. There is 2.3 x 2.2 cm aneurysm in the left common iliac artery. Reproductive: Prostate is enlarged projecting into the base of the bladder. There are scattered calcifications in prostate. Other: There is no ascites or pneumoperitoneum. Small umbilical hernia containing fat is seen. Small bilateral inguinal hernias containing fat are noted. Musculoskeletal: There is comminuted intertrochanteric fracture in proximal left femur. There is no dislocation. Degenerative changes are noted in both hips. There is large Schmorl's node in the upper endplate of body of L4 vertebra. Degenerative changes are noted in thoracic  and lumbar spine. IMPRESSION: Recent comminuted intertrochanteric fracture is seen in proximal left femur. No other definite recent fractures are seen. There is large Schmorl's node in the upper endplate of body of L4 vertebra. Degenerative changes are noted in thoracic and lumbar spine with bony spurs. Patchy alveolar infiltrate is seen in right lower lobe suggesting atelectasis/pneumonia. Small linear densities in left lower lung field may suggest scarring or subsegmental atelectasis. There is no evidence of hematoma in mediastinum and retroperitoneal  region. There is no demonstrable laceration in solid organs. There is no bowel wall thickening. Coronary artery disease. Aortic atherosclerosis. Aneurysmal dilation is seen in both common iliac arteries, larger on the right side. Diverticulosis of colon without signs of focal diverticulitis. Enlarged prostate. Electronically Signed   By: Ernie Avena M.D.   On: 08/10/2022 15:23   CT Head Wo Contrast  Result Date: 08/10/2022 CLINICAL DATA:  Head trauma, moderate-severe; Neck trauma (Age >= 65y). Mechanical fall. EXAM: CT HEAD WITHOUT CONTRAST CT CERVICAL SPINE WITHOUT CONTRAST TECHNIQUE: Multidetector CT imaging of the head and cervical spine was performed following the standard protocol without intravenous contrast. Multiplanar CT image reconstructions of the cervical spine were also generated. RADIATION DOSE REDUCTION: This exam was performed according to the departmental dose-optimization program which includes automated exposure control, adjustment of the mA and/or kV according to patient size and/or use of iterative reconstruction technique. COMPARISON:  Head CT 10/17/2017. FINDINGS: CT HEAD FINDINGS Brain: No acute hemorrhage. Unchanged moderate chronic small-vessel disease. Cortical gray-white differentiation is otherwise preserved. Prominence of the ventricles and sulci within expected range for age. No hydrocephalus or extra-axial collection. No  mass effect or midline shift. Vascular: No hyperdense vessel or unexpected calcification. Skull: No calvarial fracture or suspicious bone lesion. Skull base is unremarkable. Sinuses/Orbits: Unremarkable. Other: None. CT CERVICAL SPINE FINDINGS Alignment: 3 mm degenerative anterolisthesis of C7 on T1. No traumatic malalignment. Skull base and vertebrae: No acute fracture. Normal craniocervical junction. No suspicious bone lesions. Soft tissues and spinal canal: No prevertebral fluid or swelling. No visible canal hematoma. Disc levels: Multilevel lower cervical spondylosis and ossification of the posterior longitudinal ligament, worst at C4-5, where there is at least moderate spinal canal stenosis. Upper chest: Unremarkable. Other: None. IMPRESSION: 1. No acute intracranial abnormality. 2. No acute fracture or traumatic malalignment of the cervical spine. 3. Multilevel lower cervical spondylosis and ossification of the posterior longitudinal ligament, worst at C4-5, where there is at least moderate spinal canal stenosis. Electronically Signed   By: Orvan Falconer M.D.   On: 08/10/2022 15:05   CT Cervical Spine Wo Contrast  Result Date: 08/10/2022 CLINICAL DATA:  Head trauma, moderate-severe; Neck trauma (Age >= 65y). Mechanical fall. EXAM: CT HEAD WITHOUT CONTRAST CT CERVICAL SPINE WITHOUT CONTRAST TECHNIQUE: Multidetector CT imaging of the head and cervical spine was performed following the standard protocol without intravenous contrast. Multiplanar CT image reconstructions of the cervical spine were also generated. RADIATION DOSE REDUCTION: This exam was performed according to the departmental dose-optimization program which includes automated exposure control, adjustment of the mA and/or kV according to patient size and/or use of iterative reconstruction technique. COMPARISON:  Head CT 10/17/2017. FINDINGS: CT HEAD FINDINGS Brain: No acute hemorrhage. Unchanged moderate chronic small-vessel disease. Cortical  gray-white differentiation is otherwise preserved. Prominence of the ventricles and sulci within expected range for age. No hydrocephalus or extra-axial collection. No mass effect or midline shift. Vascular: No hyperdense vessel or unexpected calcification. Skull: No calvarial fracture or suspicious bone lesion. Skull base is unremarkable. Sinuses/Orbits: Unremarkable. Other: None. CT CERVICAL SPINE FINDINGS Alignment: 3 mm degenerative anterolisthesis of C7 on T1. No traumatic malalignment. Skull base and vertebrae: No acute fracture. Normal craniocervical junction. No suspicious bone lesions. Soft tissues and spinal canal: No prevertebral fluid or swelling. No visible canal hematoma. Disc levels: Multilevel lower cervical spondylosis and ossification of the posterior longitudinal ligament, worst at C4-5, where there is at least moderate spinal canal stenosis. Upper chest: Unremarkable. Other: None. IMPRESSION: 1. No acute  intracranial abnormality. 2. No acute fracture or traumatic malalignment of the cervical spine. 3. Multilevel lower cervical spondylosis and ossification of the posterior longitudinal ligament, worst at C4-5, where there is at least moderate spinal canal stenosis. Electronically Signed   By: Orvan Falconer M.D.   On: 08/10/2022 15:05   DG Femur Min 2 Views Left  Result Date: 08/10/2022 CLINICAL DATA:  Fall, left leg pain EXAM: LEFT FEMUR 2 VIEWS COMPARISON:  Pelvic radiographs 08/10/2022 FINDINGS: Acute intertrochanteric fracture of the left proximal femur. No significant angulation. Vascular calcifications noted. IMPRESSION: 1. Acute intertrochanteric fracture of the left proximal femur. Electronically Signed   By: Gaylyn Rong M.D.   On: 08/10/2022 14:42   DG Elbow Complete Right  Result Date: 08/10/2022 CLINICAL DATA:  Fall, right elbow injury EXAM: RIGHT ELBOW - COMPLETE 3+ VIEW COMPARISON:  None Available. FINDINGS: IV tubing noted. Mild epicondylar spurring medially and  laterally. Mild spurring along the coronoid process. Accounting for obliquity, no elbow joint effusion or visible fracture. Supinator fat pad unremarkable. IMPRESSION: 1. Mild degenerative findings. No fracture or joint effusion identified. Electronically Signed   By: Gaylyn Rong M.D.   On: 08/10/2022 14:40   DG Pelvis Portable  Result Date: 08/10/2022 CLINICAL DATA:  [place]  Left leg pain. EXAM: PORTABLE PELVIS 1-2 VIEWS COMPARISON:  CT pelvis 09/12/2020 FINDINGS: Mild axial articular space narrowing in both hips. Spurring and subcortical cyst formation along the left upper acetabulum. No appreciable fracture or acute bony findings. Vascular calcifications noted. IMPRESSION: 1. Mild degenerative findings in both hips. No acute findings. Electronically Signed   By: Gaylyn Rong M.D.   On: 08/10/2022 14:39     Assessment and Plan:   1. Preop risk stratification for hip fracture repair - RCRI >11% given comorbid conditions of CAD, chronic diastolic CHF, h/o CVA, CKD, which indicates high surgical risk at baseline simply based on medical history. However, the patient is not describing any unstable cardiac symptoms at this time. Will review further with Dr. Anne Fu but anticipate ordering echocardiogram to follow up aortic stenosis to help guide final recommendation. I called echo dept and secretary instructed me to leave VM on echo manager's voicemail which will be checked early in AM - asked that they help prioritize study in AM  2. Coronary artery disease - no recent progressive anginal symptoms, on DAPT chronically, will need to be held prior to surgery - continue perioperative beta blocker, amlodipine, Imdur as able prior to surgery - continue atorvastatin  3. Mild aortic stenosis - will pursue echocardiogram as above  4. Carotid artery disease - last duplex 05/2021 felt to be stable, recent study deferred given stability of testing and no recent symptoms  5. CKD stage  IV - Cr appears near baseline  6. Chronic diastolic CHF - appears euvolemic  - follow volume status, judicious use of IV fluids   Risk Assessment/Risk Scores:        New York Heart Association (NYHA) Functional Class NYHA Class II   For questions or updates, please contact [facility] Please consult [email or web address] for contact info under    Signed, Laurann Montana, PA-C  08/10/2022 4:05 PM  Personally seen and examined. Agree with above.  88 year old male former patient of Dr. Sherilyn Cooter Smith's with known coronary artery disease with most recent catheterization resulting in medical management with diffuse circumflex disease with mild aortic stenosis diabetes obstructive sleep apnea stage IV chronic kidney disease left [facility] 2019 who unfortunately had  [place] mechanical [place] and fractured his left hip.  Overall he is able to complete greater than 4 METS of activity without difficulty.  No recent progressive anginal symptoms.  Creatinine is elevated 2.9 but mainly at baseline.  On exam 2/6 systolic murmur regular rhythm lungs are clear alert and oriented.  His son, also a patient of mine, is present.  Assessment and plan:  Preoperative risk stratification - He may proceed with hip fracture repair with low to moderate cardiac risk.  We will go ahead and order an echocardiogram to have prioritized for tomorrow morning.   Coronary disease appears stable.  His dual antiplatelet therapy is being held prior to surgery.  Continuing with beta-blocker amlodipine isosorbide.  Continue with atorvastatin.  Will monitor  Donato Hanger, MD

## 2022-08-10 NOTE — Anesthesia Postprocedure Evaluation (Signed)
Anesthesia Post Note  Patient: Marco Acevedo  Procedure(s) Performed: AN AD HOC NERVE BLOCK     Patient location during evaluation: PACU Anesthesia Type: Regional Level of consciousness: awake and alert Pain management: pain level controlled Vital Signs Assessment: post-procedure vital signs reviewed and stable Respiratory status: spontaneous breathing Cardiovascular status: stable Anesthetic complications: no   No notable events documented.  Last Vitals:  Vitals:   08/10/22 1637 08/10/22 1645  BP: (!) 185/83 (!) 168/83  Pulse: 94 93  Resp: 19 18  Temp: 37 C   SpO2: 94% 93%    Last Pain:  Vitals:   08/10/22 1340  TempSrc: Oral  PainSc:                  [name]

## 2022-08-11 ENCOUNTER — Encounter (HOSPITAL_COMMUNITY): Payer: Self-pay | Admitting: Internal Medicine

## 2022-08-11 ENCOUNTER — Encounter (HOSPITAL_COMMUNITY)
Admission: EM | Disposition: A | Discharge status: Rehabilitation Facility | Payer: Self-pay | Source: Home / Self Care | Attending: Internal Medicine

## 2022-08-11 ENCOUNTER — Inpatient Hospital Stay (HOSPITAL_COMMUNITY): Payer: No Typology Code available for payment source | Admitting: Anesthesiology

## 2022-08-11 ENCOUNTER — Inpatient Hospital Stay (HOSPITAL_COMMUNITY): Payer: No Typology Code available for payment source

## 2022-08-11 ENCOUNTER — Other Ambulatory Visit: Payer: Self-pay

## 2022-08-11 DIAGNOSIS — J449 Chronic obstructive pulmonary disease, unspecified: Secondary | ICD-10-CM | POA: Diagnosis not present

## 2022-08-11 DIAGNOSIS — I351 Nonrheumatic aortic (valve) insufficiency: Secondary | ICD-10-CM

## 2022-08-11 DIAGNOSIS — I110 Hypertensive heart disease with heart failure: Secondary | ICD-10-CM | POA: Diagnosis not present

## 2022-08-11 DIAGNOSIS — I25119 Atherosclerotic heart disease of native coronary artery with unspecified angina pectoris: Secondary | ICD-10-CM | POA: Diagnosis not present

## 2022-08-11 DIAGNOSIS — G4730 Sleep apnea, unspecified: Secondary | ICD-10-CM

## 2022-08-11 DIAGNOSIS — I350 Nonrheumatic aortic (valve) stenosis: Secondary | ICD-10-CM

## 2022-08-11 DIAGNOSIS — I10 Essential (primary) hypertension: Secondary | ICD-10-CM

## 2022-08-11 DIAGNOSIS — I509 Heart failure, unspecified: Secondary | ICD-10-CM | POA: Diagnosis not present

## 2022-08-11 DIAGNOSIS — Z87891 Personal history of nicotine dependence: Secondary | ICD-10-CM | POA: Diagnosis not present

## 2022-08-11 DIAGNOSIS — S72002A Fracture of unspecified part of neck of left femur, initial encounter for closed fracture: Secondary | ICD-10-CM

## 2022-08-11 DIAGNOSIS — I5042 Chronic combined systolic (congestive) and diastolic (congestive) heart failure: Secondary | ICD-10-CM

## 2022-08-11 DIAGNOSIS — W19XXXA Unspecified fall, initial encounter: Secondary | ICD-10-CM

## 2022-08-11 HISTORY — PX: INTRAMEDULLARY (IM) NAIL INTERTROCHANTERIC: SHX5875

## 2022-08-11 LAB — SURGICAL PCR SCREEN
MRSA, PCR: NEGATIVE
Staphylococcus aureus: NEGATIVE

## 2022-08-11 LAB — ECHOCARDIOGRAM COMPLETE
AR max vel: 0.92 cm2
AV Area VTI: 0.84 cm2
AV Area mean vel: 0.85 cm2
AV Mean grad: 23 mmHg
AV Peak grad: 36 mmHg
Ao pk vel: 3 m/s
Area-P 1/2: 2.75 cm2
Height: 68 in
Weight: 3056 oz

## 2022-08-11 LAB — GLUCOSE, CAPILLARY
Glucose-Capillary: 131 mg/dL — ABNORMAL HIGH (ref 70–99)
Glucose-Capillary: 112 mg/dL — ABNORMAL HIGH (ref 70–99)
Glucose-Capillary: 98 mg/dL (ref 70–99)
Glucose-Capillary: 111 mg/dL — ABNORMAL HIGH (ref 70–99)
Glucose-Capillary: 217 mg/dL — ABNORMAL HIGH (ref 70–99)

## 2022-08-11 SURGERY — FIXATION, FRACTURE, INTERTROCHANTERIC, WITH INTRAMEDULLARY ROD
Anesthesia: General | Site: Hip | Laterality: Left

## 2022-08-11 MED ORDER — FENTANYL CITRATE (PF) 100 MCG/2ML IJ SOLN
INTRAMUSCULAR | Status: AC
  Filled 2022-08-11: qty 2

## 2022-08-11 MED ORDER — 0.9 % SODIUM CHLORIDE (POUR BTL) OPTIME
TOPICAL | Status: DC | PRN
  Administered 2022-08-11: 1000 mL

## 2022-08-11 MED ORDER — CEFAZOLIN SODIUM-DEXTROSE 2-4 GM/100ML-% IV SOLN
2.0000 g | Freq: Three times a day (TID) | INTRAVENOUS | Status: AC
  Administered 2022-08-11 – 2022-08-12 (×2): 2 g via INTRAVENOUS
  Filled 2022-08-11 (×2): qty 100

## 2022-08-11 MED ORDER — LIDOCAINE 2% (20 MG/ML) 5 ML SYRINGE
INTRAMUSCULAR | Status: AC
  Filled 2022-08-11: qty 5

## 2022-08-11 MED ORDER — LOSARTAN POTASSIUM 50 MG PO TABS: 25.0000 mg | ORAL_TABLET | Freq: Every day | ORAL | Status: DC

## 2022-08-11 MED ORDER — PHENYLEPHRINE 80 MCG/ML (10ML) SYRINGE FOR IV PUSH (FOR BLOOD PRESSURE SUPPORT)
PREFILLED_SYRINGE | INTRAVENOUS | Status: DC | PRN
  Administered 2022-08-11 (×3): 80 ug via INTRAVENOUS

## 2022-08-11 MED ORDER — HYDROCODONE-ACETAMINOPHEN 5-325 MG PO TABS
1.0000 | ORAL_TABLET | Freq: Four times a day (QID) | ORAL | Status: DC | PRN
  Administered 2022-08-11: 2 via ORAL
  Administered 2022-08-12: 1 via ORAL
  Administered 2022-08-13 – 2022-08-14 (×3): 2 via ORAL
  Administered 2022-08-15: 1 via ORAL
  Filled 2022-08-11: qty 2
  Filled 2022-08-11: qty 1
  Filled 2022-08-11 (×2): qty 2
  Filled 2022-08-11: qty 1
  Filled 2022-08-11: qty 2

## 2022-08-11 MED ORDER — PERFLUTREN LIPID MICROSPHERE
1.0000 mL | INTRAVENOUS | Status: AC | PRN
  Administered 2022-08-11: 2 mL via INTRAVENOUS

## 2022-08-11 MED ORDER — TRANEXAMIC ACID-NACL 1000-0.7 MG/100ML-% IV SOLN
INTRAVENOUS | Status: DC | PRN
  Administered 2022-08-11: 1000 mg via INTRAVENOUS

## 2022-08-11 MED ORDER — CARVEDILOL 3.125 MG PO TABS
3.1250 mg | ORAL_TABLET | Freq: Two times a day (BID) | ORAL | Status: DC
  Administered 2022-08-11 – 2022-08-16 (×11): 3.125 mg via ORAL
  Filled 2022-08-11 (×11): qty 1

## 2022-08-11 MED ORDER — ALBUTEROL SULFATE (2.5 MG/3ML) 0.083% IN NEBU: 2.5000 mg | INHALATION_SOLUTION | Freq: Four times a day (QID) | RESPIRATORY_TRACT | Status: DC | PRN

## 2022-08-11 MED ORDER — GABAPENTIN 300 MG PO CAPS
300.0000 mg | ORAL_CAPSULE | Freq: Every day | ORAL | Status: DC
  Administered 2022-08-11 – 2022-08-12 (×2): 300 mg via ORAL
  Filled 2022-08-11 (×2): qty 1

## 2022-08-11 MED ORDER — PANTOPRAZOLE SODIUM 40 MG PO TBEC
40.0000 mg | DELAYED_RELEASE_TABLET | Freq: Every day | ORAL | Status: DC
  Administered 2022-08-11 – 2022-08-16 (×6): 40 mg via ORAL
  Filled 2022-08-11 (×6): qty 1

## 2022-08-11 MED ORDER — ASPIRIN 81 MG PO TBEC
81.0000 mg | DELAYED_RELEASE_TABLET | Freq: Every day | ORAL | Status: DC
  Administered 2022-08-11 – 2022-08-16 (×6): 81 mg via ORAL
  Filled 2022-08-11 (×6): qty 1

## 2022-08-11 MED ORDER — ISOSORBIDE MONONITRATE ER 60 MG PO TB24
120.0000 mg | ORAL_TABLET | Freq: Every day | ORAL | Status: DC
  Administered 2022-08-11 – 2022-08-16 (×6): 120 mg via ORAL
  Filled 2022-08-11 (×7): qty 2

## 2022-08-11 MED ORDER — ALBUTEROL SULFATE HFA 108 (90 BASE) MCG/ACT IN AERS
INHALATION_SPRAY | RESPIRATORY_TRACT | Status: DC | PRN
  Administered 2022-08-11: 2 via RESPIRATORY_TRACT

## 2022-08-11 MED ORDER — INSULIN ASPART 100 UNIT/ML IJ SOLN: 0.0000 [IU] | INTRAMUSCULAR | Status: DC | PRN

## 2022-08-11 MED ORDER — DEXAMETHASONE SODIUM PHOSPHATE 10 MG/ML IJ SOLN
INTRAMUSCULAR | Status: DC | PRN
  Administered 2022-08-11: 4 mg via INTRAVENOUS

## 2022-08-11 MED ORDER — ENOXAPARIN SODIUM 30 MG/0.3ML IJ SOSY
30.0000 mg | PREFILLED_SYRINGE | INTRAMUSCULAR | Status: DC
  Administered 2022-08-12 – 2022-08-16 (×5): 30 mg via SUBCUTANEOUS
  Filled 2022-08-11 (×6): qty 0.3

## 2022-08-11 MED ORDER — VANCOMYCIN HCL 1000 MG IV SOLR
INTRAVENOUS | Status: AC
  Filled 2022-08-11: qty 20

## 2022-08-11 MED ORDER — ROCURONIUM BROMIDE 10 MG/ML (PF) SYRINGE
PREFILLED_SYRINGE | INTRAVENOUS | Status: AC
  Filled 2022-08-11: qty 10

## 2022-08-11 MED ORDER — PROPOFOL 10 MG/ML IV BOLUS
INTRAVENOUS | Status: DC | PRN
  Administered 2022-08-11: 20 mg via INTRAVENOUS
  Administered 2022-08-11: 60 mg via INTRAVENOUS
  Administered 2022-08-11: 30 mg via INTRAVENOUS

## 2022-08-11 MED ORDER — PHENYLEPHRINE HCL-NACL 20-0.9 MG/250ML-% IV SOLN
INTRAVENOUS | Status: DC | PRN
  Administered 2022-08-11: 40 ug/min via INTRAVENOUS

## 2022-08-11 MED ORDER — VANCOMYCIN HCL 1000 MG IV SOLR
INTRAVENOUS | Status: DC | PRN
  Administered 2022-08-11: 1000 mg

## 2022-08-11 MED ORDER — SUGAMMADEX SODIUM 200 MG/2ML IV SOLN
INTRAVENOUS | Status: DC | PRN
  Administered 2022-08-11: 200 mg via INTRAVENOUS

## 2022-08-11 MED ORDER — BISACODYL 5 MG PO TBEC: 5.0000 mg | DELAYED_RELEASE_TABLET | Freq: Every day | ORAL | Status: DC | PRN

## 2022-08-11 MED ORDER — FENTANYL CITRATE (PF) 100 MCG/2ML IJ SOLN
25.0000 ug | INTRAMUSCULAR | Status: DC | PRN
  Administered 2022-08-11 (×2): 50 ug via INTRAVENOUS

## 2022-08-11 MED ORDER — HYDROMORPHONE HCL 1 MG/ML IJ SOLN
0.5000 mg | INTRAMUSCULAR | Status: DC | PRN
  Administered 2022-08-11 – 2022-08-13 (×3): 0.5 mg via INTRAVENOUS
  Filled 2022-08-11 (×3): qty 0.5

## 2022-08-11 MED ORDER — MIDAZOLAM HCL 2 MG/2ML IJ SOLN
INTRAMUSCULAR | Status: AC
  Filled 2022-08-11: qty 2

## 2022-08-11 MED ORDER — SODIUM CHLORIDE 0.9 % IV SOLN: INTRAVENOUS | Status: DC | PRN

## 2022-08-11 MED ORDER — FENTANYL CITRATE (PF) 250 MCG/5ML IJ SOLN
INTRAMUSCULAR | Status: AC
  Filled 2022-08-11: qty 5

## 2022-08-11 MED ORDER — GUAIFENESIN 200 MG PO TABS
400.0000 mg | ORAL_TABLET | Freq: Every day | ORAL | Status: DC
  Administered 2022-08-11 – 2022-08-15 (×5): 400 mg via ORAL
  Filled 2022-08-11 (×6): qty 2

## 2022-08-11 MED ORDER — ALBUTEROL SULFATE HFA 108 (90 BASE) MCG/ACT IN AERS
2.0000 | INHALATION_SPRAY | Freq: Once | RESPIRATORY_TRACT | Status: DC
  Filled 2022-08-11: qty 6.7

## 2022-08-11 MED ORDER — SODIUM CHLORIDE (PF) 0.9 % IJ SOLN
INTRAMUSCULAR | Status: AC
  Filled 2022-08-11: qty 10

## 2022-08-11 MED ORDER — TRANEXAMIC ACID-NACL 1000-0.7 MG/100ML-% IV SOLN
INTRAVENOUS | Status: AC
  Filled 2022-08-11: qty 100

## 2022-08-11 MED ORDER — TRAMADOL HCL 50 MG PO TABS
50.0000 mg | ORAL_TABLET | Freq: Every evening | ORAL | Status: DC | PRN
  Administered 2022-08-12: 50 mg via ORAL
  Filled 2022-08-11: qty 1

## 2022-08-11 MED ORDER — ONDANSETRON HCL 4 MG/2ML IJ SOLN
INTRAMUSCULAR | Status: DC | PRN
  Administered 2022-08-11: 4 mg via INTRAVENOUS

## 2022-08-11 MED ORDER — ACETAMINOPHEN 500 MG PO TABS
1000.0000 mg | ORAL_TABLET | Freq: Once | ORAL | Status: AC
  Administered 2022-08-11: 1000 mg via ORAL
  Filled 2022-08-11: qty 2

## 2022-08-11 MED ORDER — ENSURE ENLIVE PO LIQD
237.0000 mL | Freq: Two times a day (BID) | ORAL | Status: DC
  Administered 2022-08-12 – 2022-08-15 (×7): 237 mL via ORAL

## 2022-08-11 MED ORDER — ALBUMIN HUMAN 5 % IV SOLN: INTRAVENOUS | Status: DC | PRN

## 2022-08-11 MED ORDER — LACTATED RINGERS IV SOLN: INTRAVENOUS | Status: DC

## 2022-08-11 MED ORDER — SUCRALFATE 1 GM/10ML PO SUSP
1.0000 g | Freq: Four times a day (QID) | ORAL | Status: DC
  Administered 2022-08-11 – 2022-08-16 (×14): 1 g via ORAL
  Filled 2022-08-11 (×25): qty 10

## 2022-08-11 MED ORDER — DEXAMETHASONE SODIUM PHOSPHATE 10 MG/ML IJ SOLN
INTRAMUSCULAR | Status: AC
  Filled 2022-08-11: qty 1

## 2022-08-11 MED ORDER — ATORVASTATIN CALCIUM 80 MG PO TABS
80.0000 mg | ORAL_TABLET | Freq: Every day | ORAL | Status: DC
  Administered 2022-08-11 – 2022-08-16 (×6): 80 mg via ORAL
  Filled 2022-08-11 (×6): qty 1

## 2022-08-11 MED ORDER — LIDOCAINE 2% (20 MG/ML) 5 ML SYRINGE
INTRAMUSCULAR | Status: DC | PRN
  Administered 2022-08-11: 100 mg via INTRAVENOUS

## 2022-08-11 MED ORDER — FENTANYL CITRATE (PF) 250 MCG/5ML IJ SOLN
INTRAMUSCULAR | Status: DC | PRN
  Administered 2022-08-11 (×3): 50 ug via INTRAVENOUS

## 2022-08-11 MED ORDER — ALBUTEROL SULFATE HFA 108 (90 BASE) MCG/ACT IN AERS
INHALATION_SPRAY | RESPIRATORY_TRACT | Status: AC
  Filled 2022-08-11: qty 6.7

## 2022-08-11 MED ORDER — AMLODIPINE BESYLATE 5 MG PO TABS
5.0000 mg | ORAL_TABLET | Freq: Every day | ORAL | Status: DC
  Administered 2022-08-11 – 2022-08-16 (×6): 5 mg via ORAL
  Filled 2022-08-11 (×6): qty 1

## 2022-08-11 MED ORDER — FINASTERIDE 5 MG PO TABS
5.0000 mg | ORAL_TABLET | Freq: Every day | ORAL | Status: DC
  Administered 2022-08-11 – 2022-08-16 (×6): 5 mg via ORAL
  Filled 2022-08-11 (×6): qty 1

## 2022-08-11 MED ORDER — ROCURONIUM BROMIDE 10 MG/ML (PF) SYRINGE
PREFILLED_SYRINGE | INTRAVENOUS | Status: DC | PRN
  Administered 2022-08-11: 60 mg via INTRAVENOUS

## 2022-08-11 MED ORDER — ONDANSETRON HCL 4 MG/2ML IJ SOLN
INTRAMUSCULAR | Status: AC
  Filled 2022-08-11: qty 2

## 2022-08-11 MED ORDER — CHLORHEXIDINE GLUCONATE 0.12 % MT SOLN
15.0000 mL | Freq: Once | OROMUCOSAL | Status: AC
  Administered 2022-08-11: 15 mL via OROMUCOSAL
  Filled 2022-08-11: qty 15

## 2022-08-11 MED ORDER — ALLOPURINOL 100 MG PO TABS
100.0000 mg | ORAL_TABLET | Freq: Every day | ORAL | Status: DC
  Administered 2022-08-11 – 2022-08-16 (×6): 100 mg via ORAL
  Filled 2022-08-11 (×6): qty 1

## 2022-08-11 MED ORDER — ALBUTEROL SULFATE HFA 108 (90 BASE) MCG/ACT IN AERS: 2.0000 | INHALATION_SPRAY | Freq: Four times a day (QID) | RESPIRATORY_TRACT | Status: DC | PRN

## 2022-08-11 MED ORDER — ORAL CARE MOUTH RINSE: 15.0000 mL | Freq: Once | OROMUCOSAL | Status: AC

## 2022-08-11 MED ORDER — SENNOSIDES-DOCUSATE SODIUM 8.6-50 MG PO TABS: 1.0000 | ORAL_TABLET | Freq: Every evening | ORAL | Status: DC | PRN

## 2022-08-11 MED ORDER — MIDAZOLAM HCL 5 MG/5ML IJ SOLN
INTRAMUSCULAR | Status: DC | PRN
  Administered 2022-08-11: 1 mg via INTRAVENOUS

## 2022-08-11 MED ORDER — PRIMIDONE 50 MG PO TABS
50.0000 mg | ORAL_TABLET | Freq: Every day | ORAL | Status: DC
  Administered 2022-08-11 – 2022-08-15 (×5): 50 mg via ORAL
  Filled 2022-08-11 (×6): qty 1

## 2022-08-11 SURGICAL SUPPLY — 39 items
ADH SKN CLS LQ APL DERMABOND (GAUZE/BANDAGES/DRESSINGS) ×1
APL PRP STRL LF DISP 70% ISPRP (MISCELLANEOUS) ×1
BAG COUNTER SPONGE SURGICOUNT (BAG) ×1 IMPLANT
BAG SPNG CNTER NS LX DISP (BAG)
BIT DRILL INTERTAN LAG SCREW (BIT) IMPLANT
BIT DRILL LONG 4.0 (BIT) IMPLANT
CHLORAPREP W/TINT 26 (MISCELLANEOUS) ×1 IMPLANT
COVER PERINEAL POST (MISCELLANEOUS) ×1 IMPLANT
COVER SURGICAL LIGHT HANDLE (MISCELLANEOUS) ×1 IMPLANT
DERMABOND ADVANCED .7 DNX6 (GAUZE/BANDAGES/DRESSINGS) ×1 IMPLANT
DRAPE C-ARM 42X72 X-RAY (DRAPES) ×1 IMPLANT
DRAPE C-ARMOR (DRAPES) ×1 IMPLANT
DRAPE STERI IOBAN 125X83 (DRAPES) ×1 IMPLANT
DRAPE U-SHAPE 47X51 STRL (DRAPES) ×2 IMPLANT
DRILL BIT LONG 4.0 (BIT) ×1
DRSG TEGADERM 4X4.75 (GAUZE/BANDAGES/DRESSINGS) ×3 IMPLANT
ELECT REM PT RETURN 15FT ADLT (MISCELLANEOUS) IMPLANT
GAUZE SPONGE 4X4 12PLY STRL (GAUZE/BANDAGES/DRESSINGS) IMPLANT
GAUZE SPONGE 4X4 16PLY NS LF (WOUND CARE) ×1 IMPLANT
GLOVE BIOGEL PI IND STRL 8 (GLOVE) ×1 IMPLANT
GLOVE SURG ORTHO 8.0 STRL STRW (GLOVE) ×2 IMPLANT
GOWN STRL REUS W/TWL XL LVL3 (GOWN DISPOSABLE) ×3 IMPLANT
GUIDE PIN 3.2X343 (PIN) ×2
GUIDE PIN 3.2X343MM (PIN) ×2
KIT BASIN OR (CUSTOM PROCEDURE TRAY) ×1 IMPLANT
KIT TURNOVER KIT A (KITS) IMPLANT
MANIFOLD NEPTUNE II (INSTRUMENTS) ×1 IMPLANT
NAIL INTERTAN 10X18 130D 10S (Nail) IMPLANT
NS IRRIG 1000ML POUR BTL (IV SOLUTION) ×1 IMPLANT
PACK GENERAL/GYN (CUSTOM PROCEDURE TRAY) ×1 IMPLANT
PAD ARMBOARD 7.5X6 YLW CONV (MISCELLANEOUS) ×1 IMPLANT
PIN GUIDE 3.2X343MM (PIN) IMPLANT
SCREW LAG COMPR KIT 105/100 (Screw) IMPLANT
SCREW TRIGEN LOW PROF 5.0X35 (Screw) IMPLANT
SUT MNCRL AB 3-0 PS2 18 (SUTURE) ×1 IMPLANT
SUT VIC AB 0 CT1 27 (SUTURE) ×1
SUT VIC AB 0 CT1 27XBRD ANBCTR (SUTURE) ×1 IMPLANT
SUT VIC AB 2-0 CT2 27 (SUTURE) ×1 IMPLANT
TOWEL OR NON WOVEN STRL DISP B (DISPOSABLE) ×1 IMPLANT

## 2022-08-11 NOTE — Transfer of Care (Signed)
Immediate Anesthesia Transfer of Care Note  Patient: Marco Acevedo  Procedure(s) Performed: INTRAMEDULLARY (IM) NAIL INTERTROCHANTERIC (Left: Hip)  Patient Location: PACU  Anesthesia Type:General  Level of Consciousness: awake, alert , and oriented  Airway & Oxygen Therapy: Patient Spontanous Breathing and Patient connected to nasal cannula oxygen  Post-op Assessment: Report given to RN and Post -op Vital signs reviewed and stable  Post vital signs: Reviewed and stable  Last Vitals:  Vitals Value Taken Time  BP 126/61 08/11/22 1530  Temp    Pulse 68 08/11/22 1534  Resp 16 08/11/22 1534  SpO2 91 % 08/11/22 1534  Vitals shown include unvalidated device data.  Last Pain:  Vitals:   08/11/22 1209  TempSrc:   PainSc: 3       Patients Stated Pain Goal: 2 (08/10/22 2356)  Complications: No notable events documented.

## 2022-08-11 NOTE — Anesthesia Preprocedure Evaluation (Addendum)
Anesthesia Evaluation  Patient identified by MRN, date of birth, ID band Patient awake    Reviewed: Allergy & Precautions, NPO status , Patient's Chart, lab work & pertinent test results, reviewed documented beta blocker date and time   Airway Mallampati: [name]  TM Distance: >3 FB Neck ROM: Full  Mouth opening: Limited Mouth Opening  Dental  (+) Edentulous Upper, Dental Advisory Given   Pulmonary sleep apnea , COPD,  COPD inhaler, former smoker   Pulmonary exam normal breath sounds clear to auscultation       Cardiovascular hypertension, Pt. on medications and Pt. on home beta blockers + angina  + CAD and +CHF  Normal cardiovascular exam+ Valvular Problems/Murmurs (mod AS, mod/severe AI) AS and AI  Rhythm:Regular Rate:Normal  TTE 2024 1. Technically difficult; aortic valve not well visualized but calcified  with reduced cusp excursion; likely moderate AS (mean gradient 23 mmHg; DI  0.30; AVA 0.9 cm2 but likely underestimated).   2. Left ventricular ejection fraction, by estimation, is 55 to 60%. The  left ventricle has normal function. The left ventricle has no regional  wall motion abnormalities. Left ventricular diastolic parameters are  consistent with Grade I diastolic  dysfunction (impaired relaxation). Elevated left atrial pressure.   3. Right ventricular systolic function is normal. The right ventricular  size is normal.   4. The mitral valve is normal in structure. No evidence of mitral valve  regurgitation. No evidence of mitral stenosis.   5. The aortic valve has an indeterminant number of cusps. Aortic valve  regurgitation is moderate to severe. Moderate to severe aortic valve  stenosis.   6. The inferior vena cava is normal in size with greater than 50%  respiratory variability, suggesting right atrial pressure of 3 mmHg.     Neuro/Psych  PSYCHIATRIC DISORDERS Anxiety     CVA (right sided weakness), Residual Symptoms     GI/Hepatic Neg liver ROS, hiatal hernia,GERD  ,,  Endo/Other  diabetes, Type 2, Oral Hypoglycemic Agents    Renal/GU Renal InsufficiencyRenal diseaseLab Results      Component                Value               Date                      CREATININE               2.90 (H)            08/10/2022                BUN                      51 (H)              08/10/2022                NA                       140                 08/10/2022                K                        4.5  08/10/2022                CL                       106                 08/10/2022                CO2                      22                  08/10/2022             negative genitourinary   Musculoskeletal  (+) Arthritis ,    Abdominal   Peds  Hematology  (+) Blood dyscrasia (plavix, last dose 08/10/22)   Anesthesia Other Findings   Reproductive/Obstetrics                             Anesthesia Physical Anesthesia Plan  ASA: 3  Anesthesia Plan: General   Post-op Pain Management: Tylenol PO (pre-op)*   Induction: Intravenous  PONV Risk Score and Plan: 2 and Dexamethasone, Ondansetron, Treatment may vary due to age or medical condition and TIVA  Airway Management Planned: Oral ETT  Additional Equipment:   Intra-op Plan:   Post-operative Plan: Extubation in OR  Informed Consent: I have reviewed the patients History and Physical, chart, labs and discussed the procedure including the risks, benefits and alternatives for the proposed anesthesia with the patient or authorized representative who has indicated his/her understanding and acceptance.     Dental advisory given  Plan Discussed with: CRNA  Anesthesia Plan Comments:        Anesthesia Quick Evaluation

## 2022-08-11 NOTE — Anesthesia Postprocedure Evaluation (Signed)
Anesthesia Post Note  Patient: Marco Acevedo  Procedure(s) Performed: INTRAMEDULLARY (IM) NAIL INTERTROCHANTERIC (Left: Hip)     Patient location during evaluation: PACU Anesthesia Type: General Level of consciousness: awake Pain management: pain level controlled Vital Signs Assessment: post-procedure vital signs reviewed and stable Respiratory status: spontaneous breathing, nonlabored ventilation and respiratory function stable Cardiovascular status: blood pressure returned to baseline and stable Postop Assessment: no apparent nausea or vomiting Anesthetic complications: no   No notable events documented.  Last Vitals:  Vitals:   08/11/22 1615 08/11/22 1630  BP: (!) 108/41 (!) 124/58  Pulse: 75 70  Resp: 14 16  Temp: 36.6 C 36.5 C  SpO2: 96% 99%    Last Pain:  Vitals:   08/11/22 1658  TempSrc:   PainSc: 7                  Linton Rump

## 2022-08-11 NOTE — Op Note (Signed)
DATE OF SURGERY:  08/11/2022  TIME: 3:24 PM  PATIENT NAME:  Marco Acevedo  AGE: 88 y.o.  PRE-OPERATIVE DIAGNOSIS:  Left Hip Fracture  POST-OPERATIVE DIAGNOSIS:  SAME  PROCEDURE:  INTRAMEDULLARY (IM) NAIL INTERTROCHANTERIC  SURGEON:  Diamonique Ruedas A Claudie Brickhouse  ASSISTANT: none  OPERATIVE IMPLANTS:  [employer] Intertan Nail 10 x 180 mm , 105/100 lag compression screw, 1 distal interlock  Implant Name Type Inv. Item Serial No. Manufacturer Lot No. LRB No. Used Action  NAIL INTERTAN 10X18 130D 10S - [id number] Nail NAIL INTERTAN 10X18 130D 10S  [employer] [id number] Left 1 Implanted  SCREW LAG COMPR KIT 105/100 - [id number] Screw SCREW LAG COMPR KIT 105/100  [employer] [id number] Left 1 Implanted  SCREW TRIGEN LOW PROF 5.0X35 - [id number] Screw SCREW TRIGEN LOW PROF 5.0X35  [employer] [id number] Left 1 Implanted     ESTIMATED BLOOD LOSS: 150cc  PREOPERATIVE INDICATIONS:  Marco Acevedo is a 88 y.o. year old who fell and suffered an Left Hip Fracture. He was brought into the ER and then admitted and optimized and then elected for surgical intervention.    The risks benefits and alternatives were discussed with the patient including but not limited to the risks of nonoperative treatment, versus surgical intervention including infection, bleeding, nerve injury, malunion, nonunion, hardware prominence, hardware failure, need for hardware removal, blood clots, cardiopulmonary complications, morbidity, mortality, among others, and they were willing to proceed.    OPERATIVE PROCEDURE:  The patient was brought to the operating room and placed in the supine position. Anesthesia was administered. He was placed on the fracture table.  Closed reduction was performed under C-arm guidance.  Time out was then performed after sterile prep and drape. He received preoperative antibiotics.  Small incision proximal to the greater trochanter was made and  carried down through skin and subcutaneous tissue.  Threaded guidewire was directed at the tip of the greater trochanter and advanced into the proximal metaphysis.  Positioning was confirmed with fluoroscopy.  I then used an entry reamer to enter the medullary canal.  I then passed a 10 x 180 mm InterTAN down the center of the canal attached to the targeting arm.  I then used the targeting arm to make a percutaneous incision and directed a threaded guidewire up into the head/neck segment.  I confirmed adequate tip apex distance and measured the length.  I decided to place a 105 mm screw.  I then drilled the path for the compression screw and placed an antirotation bar.  I then placed the lag screw and then placed the compression screw and compressed approximately 5 mm.  The proximal portion of the nail was statically locked.   I used the targeting arm to place a distal interlocking screw.  The targeting arm was removed.  Final fluoroscopic imaging was obtained.    The wounds were irrigated copiously, and vancomycin powder was placed in the wounds.  The gluteal fascia was closed with 0 Vicryl, and skin was closed with 2-0 Vicryl and 3-0 Monocryl.  Sterile dressing was applied with Dermabond, 4 x 4 gauze, and Tegaderm.  The patient was awakened and returned to PACU in stable and satisfactory condition. There were no complications and the patient tolerated the procedure well.   Post op recs: WB: WBAT LLE Abx: ancef x23 hours post op Imaging: PACU xrays Dressing: keep intact until follow up, change PRN if soiled or saturated. DVT prophylaxis: lovenox starting POD1  x4 weeks Follow up: 2 weeks after surgery for a wound check with Dr. Blanchie Dessert at [facility].  Address: [address], [place], [place] [address]  Office Phone: [phone]  Weber Cooks, MD Orthopaedic Surgery

## 2022-08-11 NOTE — Discharge Instructions (Signed)
Orthopedic Discharge Instructions  Diet: As you were doing prior to hospitalization   Shower:  May shower but keep the wounds dry, use an occlusive plastic wrap, NO SOAKING IN TUB.  If the bandage gets wet, change with a clean dry gauze.    Dressing:  You may change your dressing 3-5 days after surgery, unless you have a splint.  If the dressing remains clean and dry it can also be left on until follow up. If you change the dressing replace with clean gauze and tape or ace wrap.  If you had hand or foot surgery, we will plan to remove your stitches in about 2 weeks in the office.  For all other surgeries, there are sticky tapes (steri-strips) on your wounds and all the stitches are absorbable.  Leave the steri-strips in place when changing your dressings, they will peel off with time, usually 2-3 weeks.  Activity:  Increase activity slowly as tolerated, but follow the weight bearing instructions below.  The rules on driving is that you can not be taking narcotics while you drive, and you must feel in control of the vehicle.    Weight Bearing:   weight bearing as tolerated left lower extremity.    Blood clot prevention (DVT Prophylaxis): After surgery you are at an increased risk for a blood clot. you were prescribed a blood thinner, lovenox, to be taken once daily for a total of 4 weeks from surgery to help reduce your risk of getting a blood clot. This will help prevent a blood clot. Signs of a pulmonary embolus (blood clot in the lungs) include sudden short of breath, feeling lightheaded or dizzy, chest pain with a deep breath, rapid pulse rapid breathing. Signs of a blood clot in your arms or legs include new unexplained swelling and cramping, warm, red or darkened skin around the painful area. Please call the office or 911 right away if these signs or symptoms develop. To prevent constipation: you may use a stool softener such as -  Colace (over the counter) 100 mg by mouth twice a day  Drink  plenty of fluids (prune juice may be helpful) and high fiber foods Miralax (over the counter) for constipation as needed.    Itching:  If you experience itching with your medications, try taking only a single pain pill, or even half a pain pill at a time.  You may take up to 10 pain pills per day, and you can also use benadryl over the counter for itching or also to help with sleep.   Precautions:  If you experience chest pain or shortness of breath - call 911 immediately for transfer to the hospital emergency department!!   Call office [phone]) for the following: Temperature greater than 101F Persistent nausea and vomiting Severe uncontrolled pain Redness, tenderness, or signs of infection (pain, swelling, redness, odor or green/yellow discharge around the site) Difficulty breathing, headache or visual disturbances Hives Persistent dizziness or light-headedness Extreme fatigue Any other questions or concerns you may have after discharge  In an emergency, call 911 or go to an Emergency Department at a nearby hospital  Follow- Up Appointment:  Please call for an appointment to be seen approximately 2-3 week after surgery in [facility] with your surgeon Dr. Weber Cooks - [phone] Address: [address], [place], [place] [address]

## 2022-08-11 NOTE — Progress Notes (Signed)
  Echocardiogram 2D Echocardiogram has been performed.  Francina Beery Wynn Banker 08/11/2022, 8:32 AM

## 2022-08-11 NOTE — Progress Notes (Signed)
Initial Nutrition Assessment  DOCUMENTATION CODES:   Not applicable  INTERVENTION:  Once diet resumes, recommend: Regular diet for widest variety of menu options for optimal nutritional intake Ensure Enlive po BID, each supplement provides 350 kcal and 20 grams of protein.  NUTRITION DIAGNOSIS:   Increased nutrient needs related to hip fracture, post-op healing as evidenced by estimated needs.  GOAL:   Patient will meet greater than or equal to 90% of their needs  MONITOR:   PO intake, Supplement acceptance, Labs, Weight trends, Diet advancement, Skin  REASON FOR ASSESSMENT:   Consult Hip fracture protocol  ASSESSMENT:   Pt admitted after a fall leading to L hip fracture. PMH significant CAD with stenting x2, chronic HFpEF, CKD stage IIIb, T2DM, COPD, GERD.  Spoke with pt at bedside. Son also present at time of visit.   Pt reports that he has been on a diet to try to lose weight. He recalled weighing 217 lbs and lost about 17 lbs, although reports a current weight of 191 lbs. Time frame of weight loss unclear. Reviewed weight history, pt noted to have a 7.5% weight loss within the last 7 months which is not clinically significant for time frame.   He reports going to the gym at least 2 times per week and plays golf as well.  He reports having a great appetite but tries to be mindful of his portion sizes. He recalls having an Atkins shake with a little extra milk and fruit for breakfast. Lunch includes a salad and dinner varies but includes a meat and vegetables.   Pt states that on occasion, food may feel stuck in his throat but primarily when the food is dry. This is resolved with sips of something to drink   He reports constipation and having to strain to have a BM. He takes a stool softener nightly. Encouraged adequate intake of fiber containing foods, as well as adequate fluid intake. He recall drinking 1 gallon of unsweet tea every 2 days and two 20 oz bottles of water  while out golfing.   Medications: SSI 0-9 units TID, protonix, carafate   Labs: BUN 51, Cr 2.90, GFR 24, HgBA1c 6.5%, CBG's 98-187 x24 hours  NUTRITION - FOCUSED PHYSICAL EXAM: Deferred to follow up d/t multiple providers present.   Diet Order:   Diet Order             Diet NPO time specified  Diet effective midnight                   EDUCATION NEEDS:   Education needs have been addressed  Skin:  Skin Assessment: Reviewed RN Assessment  Last BM:  5/2  Height:   Ht Readings from Last 1 Encounters:  08/11/22 5\' 8"  (1.727 m)    Weight:   Wt Readings from Last 1 Encounters:  08/11/22 87 kg   BMI:  Body mass index is 29.16 kg/m.  Estimated Nutritional Needs:   Kcal:  1900-2100  Protein:  100-115g  Fluid:  >/=1.9L  Drusilla Kanner, RDN, LDN Clinical Nutrition

## 2022-08-11 NOTE — Progress Notes (Signed)
Progress Note  Patient Name: Marco Acevedo Date of Encounter: 08/11/2022  Primary Cardiologist:   Donato Iovine, MD   Subjective   No chest pain.  No SOB.   Inpatient Medications    Scheduled Meds:  chlorhexidine  60 mL Topical Once   fentaNYL (SUBLIMAZE) injection  50 mcg Intravenous Once   insulin aspart  0-9 Units Subcutaneous TID WC   povidone-iodine  2 Application Topical Once   Continuous Infusions:   ceFAZolin (ANCEF) IV     PRN Meds: melatonin   Vital Signs    Vitals:   08/10/22 2103 08/10/22 2356 08/11/22 0410 08/11/22 0740  BP: (!) 141/72 120/77 126/74 132/79  Pulse: 87 84 81 80  Resp: 18   18  Temp: 99 F (37.2 C) (!) 97.5 F (36.4 C) 98.3 F (36.8 C) 98 F (36.7 C)  TempSrc:  Oral Oral Oral  SpO2: 91% 92% 96% 98%  Weight:      Height:       No intake or output data in the 24 hours ending 08/11/22 0807 Filed Weights   08/10/22 1342  Weight: 86.6 kg    Telemetry    NSR - Personally Reviewed  ECG    NA - Personally Reviewed  Physical Exam   GEN: No acute distress.   Neck: No  JVD Cardiac: RRR, 2/6 apical systolic murmur, no diastolic murmurs, rubs, or gallops.  Respiratory: Clear  to auscultation bilaterally. GI: Soft, nontender, non-distended  MS: No  edema; No deformity. Neuro:  Nonfocal  Psych: Normal affect   Labs    Chemistry Recent Labs  Lab 08/10/22 1340 08/10/22 1441  NA 137 140  K 4.5 4.5  CL 105 106  CO2 22  --   GLUCOSE 113* 110*  BUN 54* 51*  CREATININE 2.58* 2.90*  CALCIUM 9.3  --   GFRNONAA 24*  --   ANIONGAP 10  --      Hematology Recent Labs  Lab 08/10/22 1340 08/10/22 1441  WBC 7.8  --   RBC 5.12  --   HGB 15.0 15.3  HCT 45.0 45.0  MCV 87.9  --   MCH 29.3  --   MCHC 33.3  --   RDW 13.8  --   PLT 244  --     Cardiac EnzymesNo results for input(s): "TROPONINI" in the last 168 hours. No results for input(s): "TROPIPOC" in the last 168 hours.   BNPNo results for input(s): "BNP", "PROBNP"  in the last 168 hours.   DDimer No results for input(s): "DDIMER" in the last 168 hours.   Radiology    DG Wrist Complete Left  Result Date: 08/10/2022 CLINICAL DATA:  Trauma, fall EXAM: LEFT WRIST - COMPLETE 3+ VIEW COMPARISON:  None Available. FINDINGS: No recent fracture or dislocation is seen. Subcortical cyst is seen in lunate. Bony spurs are seen in first carpometacarpal and first metacarpophalangeal joints. Arterial calcifications are seen in soft tissues. IMPRESSION: No recent fracture or dislocation is seen in left wrist. Electronically Signed   By: Ernie Avena M.D.   On: 08/10/2022 15:49   DG Chest Portable 1 View  Result Date: 08/10/2022 CLINICAL DATA:  Trauma, fall EXAM: PORTABLE CHEST 1 VIEW COMPARISON:  02/23/2019 FINDINGS: Cardiac size is in upper limits of normal. There are no signs of pulmonary edema or focal pulmonary consolidation. Patchy infiltrate in right lower lung field seen in the earlier CT could not be clearly identified on the radiograph. There is no  pleural effusion or pneumothorax. Air soft tissue interface seen in the lateral aspect of left lower lung field may be due to skin fold. IMPRESSION: There are no signs of pulmonary edema or focal pulmonary consolidation. Electronically Signed   By: Ernie Avena M.D.   On: 08/10/2022 15:45   CT CHEST ABDOMEN PELVIS WO CONTRAST  Result Date: 08/10/2022 CLINICAL DATA:  Trauma, fall EXAM: CT CHEST, ABDOMEN AND PELVIS WITHOUT CONTRAST TECHNIQUE: Multidetector CT imaging of the chest, abdomen and pelvis was performed following the standard protocol without IV contrast. RADIATION DOSE REDUCTION: This exam was performed according to the departmental dose-optimization program which includes automated exposure control, adjustment of the mA and/or kV according to patient size and/or use of iterative reconstruction technique. COMPARISON:  CT pelvis done on 02/12/2021, CT abdomen and pelvis done on 10/17/2012 FINDINGS: CT CHEST  FINDINGS Cardiovascular: Coronary artery calcifications are seen. Scattered calcifications are seen in thoracic aorta and its major branches. Mediastinum/Nodes: No acute findings are seen. Lungs/Pleura: There are patchy alveolar infiltrates in posteromedial right lower lung field. Small linear densities are seen in left lower lung field. There is no pleural effusion or pneumothorax. Musculoskeletal: No acute findings are seen in bony structures in the thorax. CT ABDOMEN PELVIS FINDINGS Hepatobiliary: Surgical clips are seen in gallbladder fossa. There is no dilation of bile ducts. Pancreas: No focal abnormalities are seen. Spleen: Unremarkable. Adrenals/Urinary Tract: Adrenals are unremarkable. There is no hydronephrosis. There are scattered calcifications in renal artery branches. No definite renal or ureteral stones are seen. Urinary bladder is unremarkable. Stomach/Bowel: Stomach is unremarkable. Small bowel loops are not dilated. Appendix is not dilated. There is no significant wall thickening in colon. Multiple diverticula are seen in colon without signs of focal acute diverticulitis. Vascular/Lymphatic: Arterial calcifications are seen in aorta and its major branches. There is a 3 x 2.3 cm aneurysm in the right common iliac artery. There is 2.3 x 2.2 cm aneurysm in the left common iliac artery. Reproductive: Prostate is enlarged projecting into the base of the bladder. There are scattered calcifications in prostate. Other: There is no ascites or pneumoperitoneum. Small umbilical hernia containing fat is seen. Small bilateral inguinal hernias containing fat are noted. Musculoskeletal: There is comminuted intertrochanteric fracture in proximal left femur. There is no dislocation. Degenerative changes are noted in both hips. There is large Schmorl's node in the upper endplate of body of L4 vertebra. Degenerative changes are noted in thoracic and lumbar spine. IMPRESSION: Recent comminuted intertrochanteric  fracture is seen in proximal left femur. No other definite recent fractures are seen. There is large Schmorl's node in the upper endplate of body of L4 vertebra. Degenerative changes are noted in thoracic and lumbar spine with bony spurs. Patchy alveolar infiltrate is seen in right lower lobe suggesting atelectasis/pneumonia. Small linear densities in left lower lung field may suggest scarring or subsegmental atelectasis. There is no evidence of hematoma in mediastinum and retroperitoneal region. There is no demonstrable laceration in solid organs. There is no bowel wall thickening. Coronary artery disease. Aortic atherosclerosis. Aneurysmal dilation is seen in both common iliac arteries, larger on the right side. Diverticulosis of colon without signs of focal diverticulitis. Enlarged prostate. Electronically Signed   By: Ernie Avena M.D.   On: 08/10/2022 15:23   CT Head Wo Contrast  Result Date: 08/10/2022 CLINICAL DATA:  Head trauma, moderate-severe; Neck trauma (Age >= 65y). Mechanical fall. EXAM: CT HEAD WITHOUT CONTRAST CT CERVICAL SPINE WITHOUT CONTRAST TECHNIQUE: Multidetector CT imaging of the head  and cervical spine was performed following the standard protocol without intravenous contrast. Multiplanar CT image reconstructions of the cervical spine were also generated. RADIATION DOSE REDUCTION: This exam was performed according to the departmental dose-optimization program which includes automated exposure control, adjustment of the mA and/or kV according to patient size and/or use of iterative reconstruction technique. COMPARISON:  Head CT 10/17/2017. FINDINGS: CT HEAD FINDINGS Brain: No acute hemorrhage. Unchanged moderate chronic small-vessel disease. Cortical gray-white differentiation is otherwise preserved. Prominence of the ventricles and sulci within expected range for age. No hydrocephalus or extra-axial collection. No mass effect or midline shift. Vascular: No hyperdense vessel or  unexpected calcification. Skull: No calvarial fracture or suspicious bone lesion. Skull base is unremarkable. Sinuses/Orbits: Unremarkable. Other: None. CT CERVICAL SPINE FINDINGS Alignment: 3 mm degenerative anterolisthesis of C7 on T1. No traumatic malalignment. Skull base and vertebrae: No acute fracture. Normal craniocervical junction. No suspicious bone lesions. Soft tissues and spinal canal: No prevertebral fluid or swelling. No visible canal hematoma. Disc levels: Multilevel lower cervical spondylosis and ossification of the posterior longitudinal ligament, worst at C4-5, where there is at least moderate spinal canal stenosis. Upper chest: Unremarkable. Other: None. IMPRESSION: 1. No acute intracranial abnormality. 2. No acute fracture or traumatic malalignment of the cervical spine. 3. Multilevel lower cervical spondylosis and ossification of the posterior longitudinal ligament, worst at C4-5, where there is at least moderate spinal canal stenosis. Electronically Signed   By: Orvan Falconer M.D.   On: 08/10/2022 15:05   CT Cervical Spine Wo Contrast  Result Date: 08/10/2022 CLINICAL DATA:  Head trauma, moderate-severe; Neck trauma (Age >= 65y). Mechanical fall. EXAM: CT HEAD WITHOUT CONTRAST CT CERVICAL SPINE WITHOUT CONTRAST TECHNIQUE: Multidetector CT imaging of the head and cervical spine was performed following the standard protocol without intravenous contrast. Multiplanar CT image reconstructions of the cervical spine were also generated. RADIATION DOSE REDUCTION: This exam was performed according to the departmental dose-optimization program which includes automated exposure control, adjustment of the mA and/or kV according to patient size and/or use of iterative reconstruction technique. COMPARISON:  Head CT 10/17/2017. FINDINGS: CT HEAD FINDINGS Brain: No acute hemorrhage. Unchanged moderate chronic small-vessel disease. Cortical gray-white differentiation is otherwise preserved. Prominence of  the ventricles and sulci within expected range for age. No hydrocephalus or extra-axial collection. No mass effect or midline shift. Vascular: No hyperdense vessel or unexpected calcification. Skull: No calvarial fracture or suspicious bone lesion. Skull base is unremarkable. Sinuses/Orbits: Unremarkable. Other: None. CT CERVICAL SPINE FINDINGS Alignment: 3 mm degenerative anterolisthesis of C7 on T1. No traumatic malalignment. Skull base and vertebrae: No acute fracture. Normal craniocervical junction. No suspicious bone lesions. Soft tissues and spinal canal: No prevertebral fluid or swelling. No visible canal hematoma. Disc levels: Multilevel lower cervical spondylosis and ossification of the posterior longitudinal ligament, worst at C4-5, where there is at least moderate spinal canal stenosis. Upper chest: Unremarkable. Other: None. IMPRESSION: 1. No acute intracranial abnormality. 2. No acute fracture or traumatic malalignment of the cervical spine. 3. Multilevel lower cervical spondylosis and ossification of the posterior longitudinal ligament, worst at C4-5, where there is at least moderate spinal canal stenosis. Electronically Signed   By: Orvan Falconer M.D.   On: 08/10/2022 15:05   DG Femur Min 2 Views Left  Result Date: 08/10/2022 CLINICAL DATA:  Fall, left leg pain EXAM: LEFT FEMUR 2 VIEWS COMPARISON:  Pelvic radiographs 08/10/2022 FINDINGS: Acute intertrochanteric fracture of the left proximal femur. No significant angulation. Vascular calcifications noted. IMPRESSION: 1. Acute  intertrochanteric fracture of the left proximal femur. Electronically Signed   By: Gaylyn Rong M.D.   On: 08/10/2022 14:42   DG Elbow Complete Right  Result Date: 08/10/2022 CLINICAL DATA:  Fall, right elbow injury EXAM: RIGHT ELBOW - COMPLETE 3+ VIEW COMPARISON:  None Available. FINDINGS: IV tubing noted. Mild epicondylar spurring medially and laterally. Mild spurring along the coronoid process. Accounting for  obliquity, no elbow joint effusion or visible fracture. Supinator fat pad unremarkable. IMPRESSION: 1. Mild degenerative findings. No fracture or joint effusion identified. Electronically Signed   By: Gaylyn Rong M.D.   On: 08/10/2022 14:40   DG Pelvis Portable  Result Date: 08/10/2022 CLINICAL DATA:  [place]  Left leg pain. EXAM: PORTABLE PELVIS 1-2 VIEWS COMPARISON:  CT pelvis 09/12/2020 FINDINGS: Mild axial articular space narrowing in both hips. Spurring and subcortical cyst formation along the left upper acetabulum. No appreciable fracture or acute bony findings. Vascular calcifications noted. IMPRESSION: 1. Mild degenerative findings in both hips. No acute findings. Electronically Signed   By: Gaylyn Rong M.D.   On: 08/10/2022 14:39    Cardiac Studies   ECHO  Pending final report.  Images available and I reviewed for prelim .   Poor image quality.  LV EF NL.  Moderate AS with mean gradient 25 mm Hg.  Calcified lesions on the mitral valve.     Patient Profile     88 y.o. male with a hx of  CAD (prior Cx stenting in 2007 and 2012, DES to prox Cx 2014), chronic diastolic CHF, hyperlipidemia, mild AS by echo 2019, HTN, obesity, OSA, DM, hiatal hernia, neuropathy, GERD, CKD stage IV, CVA related to carotid disease s/p L CEA 10/2017  who is being seen 08/10/2022 for the evaluation of preop risk stratification at the request of Dr. Chipper Herb.   Assessment & Plan    Hip fracture repair:    No high risk findings.    No further testing prior to hip surgery.     Coronary artery disease:  No acute symptoms.  Holding DAPT.     Mild aortic stenosis:  Echo with moderate AS, NL LV, calcified lesions as stated.     CKD stage IV:  Creat increased slightly from yesterday.  Will need to be followed closely and avoid nephrotoxic agents.   Cozaar discontinued.   HTN:  BP might creep up and Norvasc can be increased as needed.    Chronic diastolic CHF:  Seems to be euvolemic.  No change  in therapy.     For questions or updates, please contact [facility] Please consult [email or web address] for contact info under Cardiology/STEMI.   Signed, Rollene Rotunda, MD  08/11/2022, 8:07 AM

## 2022-08-11 NOTE — Progress Notes (Signed)
     Subjective: Patient with left intertrochanteric femur fracture.  Notes some mild pain relief from the fascia iliac a block performed yesterday.  Discussed plan for OR today pending his morning echo.  Denies pain other joints or extremities.  Objective:   VITALS:   Vitals:   08/10/22 2356 08/11/22 0410 08/11/22 0740 08/11/22 1151  BP: 120/77 126/74 132/79 (!) 176/83  Pulse: 84 81 80 83  Resp:   18 18  Temp: (!) 97.5 F (36.4 C) 98.3 F (36.8 C) 98 F (36.7 C) 98.3 F (36.8 C)  TempSrc: Oral Oral Oral Oral  SpO2: 92% 96% 98% 95%  Weight:    87 kg  Height:    5\' 8"  (1.727 m)    Sensation intact distally Intact pulses distally Dorsiflexion/Plantar flexion intact Compartment soft    Lab Results  Component Value Date   WBC 7.8 08/10/2022   HGB 15.3 08/10/2022   HCT 45.0 08/10/2022   MCV 87.9 08/10/2022   PLT 244 08/10/2022   BMET    Component Value Date/Time   NA 140 08/10/2022 1441   NA 140 01/02/2020 0937   K 4.5 08/10/2022 1441   CL 106 08/10/2022 1441   CO2 22 08/10/2022 1340   GLUCOSE 110 (H) 08/10/2022 1441   BUN 51 (H) 08/10/2022 1441   BUN 37 (H) 01/02/2020 0937   CREATININE 2.90 (H) 08/10/2022 1441   CALCIUM 9.3 08/10/2022 1340   GFRNONAA 24 (L) 08/10/2022 1340    Assessment/Plan: Day of Surgery   Principal Problem:   Hip fracture (HCC) Active Problems:   Left displaced femoral neck fracture (HCC)   CAD S/P percutaneous coronary angioplasty   Fall  Left intertrochanteric femur fracture plan for ORIF with cephallomedullary nail pending cardiac clearance   The risks benefits and alternatives were discussed with the patient including but not limited to the risks of nonoperative treatment, versus surgical intervention including infection, bleeding, nerve injury, malunion, nonunion, the need for revision surgery, hardware prominence, hardware failure, the need for hardware removal, blood clots, cardiopulmonary complications, morbidity, mortality,  among others, and they were willing to proceed.    De Jaworski A Larue Lightner 08/11/2022, 12:14 PM   Weber Cooks, MD  Contact information:   [phone] 7am-5pm epic message Dr. Blanchie Dessert, or call office for patient follow up: [phone] After hours and holidays please check [email or web address] for group call information for [facility]

## 2022-08-11 NOTE — Plan of Care (Signed)
  Problem: Health Behavior/Discharge Planning: Goal: Ability to manage health-related needs will improve Outcome: Progressing   Problem: Health Behavior/Discharge Planning: Goal: Ability to manage health-related needs will improve Outcome: Progressing   Problem: Clinical Measurements: Goal: Will remain free from infection Outcome: Progressing

## 2022-08-11 NOTE — Progress Notes (Signed)
PROGRESS NOTE    Marco Acevedo  WUJ:[id number] DOB: 1934-08-21 DOA: 08/10/2022 PCP: Marco Floro, MD  Outpatient Specialists:     Brief Narrative:  Patient is an 88 year old male with history of coronary artery disease status post stenting x 2, chronic heart failure with preserved ejection fraction, chronic kidney disease stage IIIb, type 2 diabetes mellitus, COPD and GERD.  Patient was admitted with left hip fracture following a fall.  Cardiology team's input is appreciated.  Patient has been cleared for surgery.  Patient is awaiting surgery this afternoon.  Echocardiogram revealed mild to moderate aortic stenosis and regurgitation, normal ejection fraction and grade 1 diastolic dysfunction.  08/11/2022: Patient seen.  No new complaints.  No chest pain, no shortness of breath.  Patient was fairly active prior to the fall.  Patient's son was updated.  For surgery this afternoon.   Assessment & Plan:   Principal Problem:   Hip fracture (HCC) Active Problems:   Left displaced femoral neck fracture (HCC)   CAD S/P percutaneous coronary angioplasty   Left intertrochanteric femoral fracture -For ORIF this afternoon.   -Cardiology input is appreciated.   -Optimize pain control.   -Postop management will be as per orthopedic team.     History of CAD -Stable.  No chest pain. -Continue aspirin and statin and beta-blocker   CKD stage IIIb -Euvolemic, creatinine at baseline   IIDM -SSI for now   COPD -Stable   GERD -Poorly controlled, continue PPI and sucralfate   DVT prophylaxis: Lovenox. Code Status: Full code. Family Communication: Son. Disposition Plan: This will depend on hospital course.   Consultants:  Cardiology Orthopedics.  Procedures:  For ORIF of left hip fracture this afternoon  Antimicrobials:  None   Subjective: Left hip pain is controlled.  Objective: Vitals:   08/10/22 2103 08/10/22 2356 08/11/22 0410 08/11/22 0740  BP: (!) 141/72 120/77  126/74 132/79  Pulse: 87 84 81 80  Resp: 18   18  Temp: 99 F (37.2 C) (!) 97.5 F (36.4 C) 98.3 F (36.8 C) 98 F (36.7 C)  TempSrc:  Oral Oral Oral  SpO2: 91% 92% 96% 98%  Weight:      Height:       No intake or output data in the 24 hours ending 08/11/22 1109 Filed Weights   08/10/22 1342  Weight: 86.6 kg    Examination:  General exam: Appears calm and comfortable  Respiratory system: Clear to auscultation.  Cardiovascular system: S1 & S2 heard, systolic murmur.   Gastrointestinal system: Abdomen is nondistended, soft and nontender. No organomegaly or masses felt. Normal bowel sounds heard. Central nervous system: Alert and oriented.  Moves all extremities.   Extremities: No leg edema.  Data Reviewed: I have personally reviewed following labs and imaging studies  CBC: Recent Labs  Lab 08/10/22 1340 08/10/22 1441  WBC 7.8  --   NEUTROABS 5.7  --   HGB 15.0 15.3  HCT 45.0 45.0  MCV 87.9  --   PLT 244  --    Basic Metabolic Panel: Recent Labs  Lab 08/10/22 1340 08/10/22 1441  NA 137 140  K 4.5 4.5  CL 105 106  CO2 22  --   GLUCOSE 113* 110*  BUN 54* 51*  CREATININE 2.58* 2.90*  CALCIUM 9.3  --    GFR: Estimated Creatinine Clearance: 19.9 mL/min (A) (by C-G formula based on SCr of 2.9 mg/dL (H)). Liver Function Tests: No results for input(s): "AST", "ALT", "ALKPHOS", "BILITOT", "PROT", "  ALBUMIN" in the last 168 hours. No results for input(s): "LIPASE", "AMYLASE" in the last 168 hours. No results for input(s): "AMMONIA" in the last 168 hours. Coagulation Profile: Recent Labs  Lab 08/10/22 1340  INR 1.0   Cardiac Enzymes: No results for input(s): "CKTOTAL", "CKMB", "CKMBINDEX", "TROPONINI" in the last 168 hours. BNP (last 3 results) No results for input(s): "PROBNP" in the last 8760 hours. HbA1C: Recent Labs    08/10/22 1807  HGBA1C 6.5*   CBG: Recent Labs  Lab 08/10/22 1800 08/10/22 2058 08/11/22 0742  GLUCAP 98 187* 131*   Lipid  Profile: No results for input(s): "CHOL", "HDL", "LDLCALC", "TRIG", "CHOLHDL", "LDLDIRECT" in the last 72 hours. Thyroid Function Tests: No results for input(s): "TSH", "T4TOTAL", "FREET4", "T3FREE", "THYROIDAB" in the last 72 hours. Anemia Panel: No results for input(s): "VITAMINB12", "FOLATE", "FERRITIN", "TIBC", "IRON", "RETICCTPCT" in the last 72 hours. Urine analysis:    Component Value Date/Time   COLORURINE YELLOW (A) 10/19/2017 1008   APPEARANCEUR CLEAR (A) 10/19/2017 1008   LABSPEC 1.025 10/19/2017 1008   PHURINE 5.0 10/19/2017 1008   GLUCOSEU NEGATIVE 10/19/2017 1008   HGBUR NEGATIVE 10/19/2017 1008   BILIRUBINUR NEGATIVE 10/19/2017 1008   KETONESUR NEGATIVE 10/19/2017 1008   PROTEINUR 30 (A) 10/19/2017 1008   NITRITE NEGATIVE 10/19/2017 1008   LEUKOCYTESUR NEGATIVE 10/19/2017 1008   Sepsis Labs: @LABRCNTIP (procalcitonin:4,lacticidven:4)  ) Recent Results (from the past 240 hour(s))  Surgical pcr screen     Status: None   Collection Time: 08/11/22  6:51 AM   Specimen: Nasal Mucosa; Nasal Swab  Result Value Ref Range Status   MRSA, PCR NEGATIVE NEGATIVE Final   Staphylococcus aureus NEGATIVE NEGATIVE Final    Comment: (NOTE) The Xpert SA Assay (FDA approved for NASAL specimens in patients 65 years of age and older), is one component of a comprehensive surveillance program. It is not intended to diagnose infection nor to guide or monitor treatment. Performed at [facility], [address] [place], [place] [address]          Radiology Studies: ECHOCARDIOGRAM COMPLETE  Result Date: 08/11/2022    ECHOCARDIOGRAM REPORT   Patient Name:   Marco Acevedo Date of Exam: 08/11/2022 Medical Rec #:  [id number]      Height:       68.0 in Accession #:    [id number]     Weight:       191.0 lb Date of Birth:  [birth year]       BSA:          2.004 m Patient Age:    87 years       BP:           124/70 mmHg Patient Gender: M              HR:           80 bpm. Exam Location:   Inpatient Procedure: 2D Echo, Cardiac Doppler, Color Doppler and Intracardiac            Opacification Agent Indications:    Pre-op  History:        Patient has prior history of Echocardiogram examinations, most                 recent 10/14/2017. CAD, COPD, Aortic stenosis; Risk Factors:Former                 Smoker.  Sonographer:    Lucy Antigua Referring Phys: Laurann Montana IMPRESSIONS  1.  Technically difficult; aortic valve not well visualized but calcified with reduced cusp excursion; likely moderate AS (mean gradient 23 mmHg; DI 0.30; AVA 0.9 cm2 but likely underestimated).  2. Left ventricular ejection fraction, by estimation, is 55 to 60%. The left ventricle has normal function. The left ventricle has no regional wall motion abnormalities. Left ventricular diastolic parameters are consistent with Grade I diastolic dysfunction (impaired relaxation). Elevated left atrial pressure.  3. Right ventricular systolic function is normal. The right ventricular size is normal.  4. The mitral valve is normal in structure. No evidence of mitral valve regurgitation. No evidence of mitral stenosis.  5. The aortic valve has an indeterminant number of cusps. Aortic valve regurgitation is moderate to severe. Moderate to severe aortic valve stenosis.  6. The inferior vena cava is normal in size with greater than 50% respiratory variability, suggesting right atrial pressure of 3 mmHg. FINDINGS  Left Ventricle: Left ventricular ejection fraction, by estimation, is 55 to 60%. The left ventricle has normal function. The left ventricle has no regional wall motion abnormalities. The left ventricular internal cavity size was normal in size. There is  no left ventricular hypertrophy. Left ventricular diastolic parameters are consistent with Grade I diastolic dysfunction (impaired relaxation). Elevated left atrial pressure. Right Ventricle: The right ventricular size is normal. No increase in right ventricular wall thickness. Right  ventricular systolic function is normal. Left Atrium: Left atrial size was normal in size. Right Atrium: Right atrial size was normal in size. Pericardium: There is no evidence of pericardial effusion. Mitral Valve: The mitral valve is normal in structure. Mild mitral annular calcification. No evidence of mitral valve regurgitation. No evidence of mitral valve stenosis. Tricuspid Valve: The tricuspid valve is normal in structure. Tricuspid valve regurgitation is trivial. No evidence of tricuspid stenosis. Aortic Valve: The aortic valve has an indeterminant number of cusps. Aortic valve regurgitation is moderate to severe. Moderate to severe aortic stenosis is present. Aortic valve mean gradient measures 23.0 mmHg. Aortic valve peak gradient measures 36.0 mmHg. Aortic valve area, by VTI measures 0.84 cm. Pulmonic Valve: The pulmonic valve was not well visualized. Pulmonic valve regurgitation is not visualized. No evidence of pulmonic stenosis. Aorta: The aortic root is normal in size and structure. Venous: The inferior vena cava is normal in size with greater than 50% respiratory variability, suggesting right atrial pressure of 3 mmHg. IAS/Shunts: No atrial level shunt detected by color flow Doppler. Additional Comments: Technically difficult; aortic valve not well visualized but calcified with reduced cusp excursion; likely moderate AS (mean gradient 23 mmHg; DI 0.30; AVA 0.9 cm2 but likely underestimated).  LEFT VENTRICLE PLAX 2D LVOT diam:     1.90 cm   Diastology LV SV:         61        LV e' medial:    51.45 cm/s LV SV Index:   30        LV E/e' medial:  1.9 LVOT Area:     2.84 cm  LV e' lateral:   5.33 cm/s                          LV E/e' lateral: 18.2  RIGHT VENTRICLE             IVC RV S prime:     15.20 cm/s  IVC diam: 2.20 cm TAPSE (M-mode): 2.6 cm LEFT ATRIUM  Index        RIGHT ATRIUM           Index LA Vol (A2C):   78.1 ml 38.97 ml/m  RA Area:     11.50 cm LA Vol (A4C):   40.2 ml 20.06  ml/m  RA Volume:   20.20 ml  10.08 ml/m LA Biplane Vol: 61.0 ml 30.44 ml/m  AORTIC VALVE AV Area (Vmax):    0.92 cm AV Area (Vmean):   0.85 cm AV Area (VTI):     0.84 cm AV Vmax:           300.00 cm/s AV Vmean:          226.000 cm/s AV VTI:            0.719 m AV Peak Grad:      36.0 mmHg AV Mean Grad:      23.0 mmHg LVOT Vmax:         97.40 cm/s LVOT Vmean:        67.600 cm/s LVOT VTI:          0.214 m LVOT/AV VTI ratio: 0.30  AORTA Ao Root diam: 2.60 cm Ao Asc diam:  2.20 cm MITRAL VALVE MV Area (PHT): 2.75 cm     SHUNTS MV Decel Time: 276 msec     Systemic VTI:  0.21 m MV E velocity: 96.90 cm/s   Systemic Diam: 1.90 cm MV A velocity: 135.00 cm/s MV E/A ratio:  0.72 Olga Millers MD Electronically signed by Olga Millers MD Signature Date/Time: 08/11/2022/9:36:04 AM    Final    DG Wrist Complete Left  Result Date: 08/10/2022 CLINICAL DATA:  Trauma, fall EXAM: LEFT WRIST - COMPLETE 3+ VIEW COMPARISON:  None Available. FINDINGS: No recent fracture or dislocation is seen. Subcortical cyst is seen in lunate. Bony spurs are seen in first carpometacarpal and first metacarpophalangeal joints. Arterial calcifications are seen in soft tissues. IMPRESSION: No recent fracture or dislocation is seen in left wrist. Electronically Signed   By: Ernie Avena M.D.   On: 08/10/2022 15:49   DG Chest Portable 1 View  Result Date: 08/10/2022 CLINICAL DATA:  Trauma, fall EXAM: PORTABLE CHEST 1 VIEW COMPARISON:  02/23/2019 FINDINGS: Cardiac size is in upper limits of normal. There are no signs of pulmonary edema or focal pulmonary consolidation. Patchy infiltrate in right lower lung field seen in the earlier CT could not be clearly identified on the radiograph. There is no pleural effusion or pneumothorax. Air soft tissue interface seen in the lateral aspect of left lower lung field may be due to skin fold. IMPRESSION: There are no signs of pulmonary edema or focal pulmonary consolidation. Electronically Signed   By:  Ernie Avena M.D.   On: 08/10/2022 15:45   CT CHEST ABDOMEN PELVIS WO CONTRAST  Result Date: 08/10/2022 CLINICAL DATA:  Trauma, fall EXAM: CT CHEST, ABDOMEN AND PELVIS WITHOUT CONTRAST TECHNIQUE: Multidetector CT imaging of the chest, abdomen and pelvis was performed following the standard protocol without IV contrast. RADIATION DOSE REDUCTION: This exam was performed according to the departmental dose-optimization program which includes automated exposure control, adjustment of the mA and/or kV according to patient size and/or use of iterative reconstruction technique. COMPARISON:  CT pelvis done on 02/12/2021, CT abdomen and pelvis done on 10/17/2012 FINDINGS: CT CHEST FINDINGS Cardiovascular: Coronary artery calcifications are seen. Scattered calcifications are seen in thoracic aorta and its major branches. Mediastinum/Nodes: No acute findings are seen. Lungs/Pleura: There are patchy alveolar infiltrates in  posteromedial right lower lung field. Small linear densities are seen in left lower lung field. There is no pleural effusion or pneumothorax. Musculoskeletal: No acute findings are seen in bony structures in the thorax. CT ABDOMEN PELVIS FINDINGS Hepatobiliary: Surgical clips are seen in gallbladder fossa. There is no dilation of bile ducts. Pancreas: No focal abnormalities are seen. Spleen: Unremarkable. Adrenals/Urinary Tract: Adrenals are unremarkable. There is no hydronephrosis. There are scattered calcifications in renal artery branches. No definite renal or ureteral stones are seen. Urinary bladder is unremarkable. Stomach/Bowel: Stomach is unremarkable. Small bowel loops are not dilated. Appendix is not dilated. There is no significant wall thickening in colon. Multiple diverticula are seen in colon without signs of focal acute diverticulitis. Vascular/Lymphatic: Arterial calcifications are seen in aorta and its major branches. There is a 3 x 2.3 cm aneurysm in the right common iliac artery.  There is 2.3 x 2.2 cm aneurysm in the left common iliac artery. Reproductive: Prostate is enlarged projecting into the base of the bladder. There are scattered calcifications in prostate. Other: There is no ascites or pneumoperitoneum. Small umbilical hernia containing fat is seen. Small bilateral inguinal hernias containing fat are noted. Musculoskeletal: There is comminuted intertrochanteric fracture in proximal left femur. There is no dislocation. Degenerative changes are noted in both hips. There is large Schmorl's node in the upper endplate of body of L4 vertebra. Degenerative changes are noted in thoracic and lumbar spine. IMPRESSION: Recent comminuted intertrochanteric fracture is seen in proximal left femur. No other definite recent fractures are seen. There is large Schmorl's node in the upper endplate of body of L4 vertebra. Degenerative changes are noted in thoracic and lumbar spine with bony spurs. Patchy alveolar infiltrate is seen in right lower lobe suggesting atelectasis/pneumonia. Small linear densities in left lower lung field may suggest scarring or subsegmental atelectasis. There is no evidence of hematoma in mediastinum and retroperitoneal region. There is no demonstrable laceration in solid organs. There is no bowel wall thickening. Coronary artery disease. Aortic atherosclerosis. Aneurysmal dilation is seen in both common iliac arteries, larger on the right side. Diverticulosis of colon without signs of focal diverticulitis. Enlarged prostate. Electronically Signed   By: Ernie Avena M.D.   On: 08/10/2022 15:23   CT Head Wo Contrast  Result Date: 08/10/2022 CLINICAL DATA:  Head trauma, moderate-severe; Neck trauma (Age >= 65y). Mechanical fall. EXAM: CT HEAD WITHOUT CONTRAST CT CERVICAL SPINE WITHOUT CONTRAST TECHNIQUE: Multidetector CT imaging of the head and cervical spine was performed following the standard protocol without intravenous contrast. Multiplanar CT image  reconstructions of the cervical spine were also generated. RADIATION DOSE REDUCTION: This exam was performed according to the departmental dose-optimization program which includes automated exposure control, adjustment of the mA and/or kV according to patient size and/or use of iterative reconstruction technique. COMPARISON:  Head CT 10/17/2017. FINDINGS: CT HEAD FINDINGS Brain: No acute hemorrhage. Unchanged moderate chronic small-vessel disease. Cortical gray-white differentiation is otherwise preserved. Prominence of the ventricles and sulci within expected range for age. No hydrocephalus or extra-axial collection. No mass effect or midline shift. Vascular: No hyperdense vessel or unexpected calcification. Skull: No calvarial fracture or suspicious bone lesion. Skull base is unremarkable. Sinuses/Orbits: Unremarkable. Other: None. CT CERVICAL SPINE FINDINGS Alignment: 3 mm degenerative anterolisthesis of C7 on T1. No traumatic malalignment. Skull base and vertebrae: No acute fracture. Normal craniocervical junction. No suspicious bone lesions. Soft tissues and spinal canal: No prevertebral fluid or swelling. No visible canal hematoma. Disc levels: Multilevel lower cervical spondylosis  and ossification of the posterior longitudinal ligament, worst at C4-5, where there is at least moderate spinal canal stenosis. Upper chest: Unremarkable. Other: None. IMPRESSION: 1. No acute intracranial abnormality. 2. No acute fracture or traumatic malalignment of the cervical spine. 3. Multilevel lower cervical spondylosis and ossification of the posterior longitudinal ligament, worst at C4-5, where there is at least moderate spinal canal stenosis. Electronically Signed   By: Orvan Falconer M.D.   On: 08/10/2022 15:05   CT Cervical Spine Wo Contrast  Result Date: 08/10/2022 CLINICAL DATA:  Head trauma, moderate-severe; Neck trauma (Age >= 65y). Mechanical fall. EXAM: CT HEAD WITHOUT CONTRAST CT CERVICAL SPINE WITHOUT  CONTRAST TECHNIQUE: Multidetector CT imaging of the head and cervical spine was performed following the standard protocol without intravenous contrast. Multiplanar CT image reconstructions of the cervical spine were also generated. RADIATION DOSE REDUCTION: This exam was performed according to the departmental dose-optimization program which includes automated exposure control, adjustment of the mA and/or kV according to patient size and/or use of iterative reconstruction technique. COMPARISON:  Head CT 10/17/2017. FINDINGS: CT HEAD FINDINGS Brain: No acute hemorrhage. Unchanged moderate chronic small-vessel disease. Cortical gray-white differentiation is otherwise preserved. Prominence of the ventricles and sulci within expected range for age. No hydrocephalus or extra-axial collection. No mass effect or midline shift. Vascular: No hyperdense vessel or unexpected calcification. Skull: No calvarial fracture or suspicious bone lesion. Skull base is unremarkable. Sinuses/Orbits: Unremarkable. Other: None. CT CERVICAL SPINE FINDINGS Alignment: 3 mm degenerative anterolisthesis of C7 on T1. No traumatic malalignment. Skull base and vertebrae: No acute fracture. Normal craniocervical junction. No suspicious bone lesions. Soft tissues and spinal canal: No prevertebral fluid or swelling. No visible canal hematoma. Disc levels: Multilevel lower cervical spondylosis and ossification of the posterior longitudinal ligament, worst at C4-5, where there is at least moderate spinal canal stenosis. Upper chest: Unremarkable. Other: None. IMPRESSION: 1. No acute intracranial abnormality. 2. No acute fracture or traumatic malalignment of the cervical spine. 3. Multilevel lower cervical spondylosis and ossification of the posterior longitudinal ligament, worst at C4-5, where there is at least moderate spinal canal stenosis. Electronically Signed   By: Orvan Falconer M.D.   On: 08/10/2022 15:05   DG Femur Min 2 Views Left  Result  Date: 08/10/2022 CLINICAL DATA:  Fall, left leg pain EXAM: LEFT FEMUR 2 VIEWS COMPARISON:  Pelvic radiographs 08/10/2022 FINDINGS: Acute intertrochanteric fracture of the left proximal femur. No significant angulation. Vascular calcifications noted. IMPRESSION: 1. Acute intertrochanteric fracture of the left proximal femur. Electronically Signed   By: Gaylyn Rong M.D.   On: 08/10/2022 14:42   DG Elbow Complete Right  Result Date: 08/10/2022 CLINICAL DATA:  Fall, right elbow injury EXAM: RIGHT ELBOW - COMPLETE 3+ VIEW COMPARISON:  None Available. FINDINGS: IV tubing noted. Mild epicondylar spurring medially and laterally. Mild spurring along the coronoid process. Accounting for obliquity, no elbow joint effusion or visible fracture. Supinator fat pad unremarkable. IMPRESSION: 1. Mild degenerative findings. No fracture or joint effusion identified. Electronically Signed   By: Gaylyn Rong M.D.   On: 08/10/2022 14:40   DG Pelvis Portable  Result Date: 08/10/2022 CLINICAL DATA:  [place]  Left leg pain. EXAM: PORTABLE PELVIS 1-2 VIEWS COMPARISON:  CT pelvis 09/12/2020 FINDINGS: Mild axial articular space narrowing in both hips. Spurring and subcortical cyst formation along the left upper acetabulum. No appreciable fracture or acute bony findings. Vascular calcifications noted. IMPRESSION: 1. Mild degenerative findings in both hips. No acute findings. Electronically Signed  By: Gaylyn Rong M.D.   On: 08/10/2022 14:39        Scheduled Meds:  allopurinol  100 mg Oral Daily   amLODipine  5 mg Oral Daily   aspirin EC  81 mg Oral Daily   atorvastatin  80 mg Oral q1800   carvedilol  3.125 mg Oral BID WC   chlorhexidine  60 mL Topical Once   enoxaparin (LOVENOX) injection  30 mg Subcutaneous Q24H   fentaNYL (SUBLIMAZE) injection  50 mcg Intravenous Once   finasteride  5 mg Oral Daily   gabapentin  300 mg Oral QHS   guaiFENesin  400 mg Oral QHS   insulin aspart  0-9 Units  Subcutaneous TID WC   isosorbide mononitrate  120 mg Oral Daily   pantoprazole  40 mg Oral Daily   povidone-iodine  2 Application Topical Once   primidone  50 mg Oral QHS   sucralfate  1 g Oral QID   Continuous Infusions:   ceFAZolin (ANCEF) IV       LOS: 1 day    Time spent: 35 minutes.    Berton Mount, MD  [facility] Pager #: [phone] 7PM-7AM contact night coverage as above

## 2022-08-11 NOTE — Anesthesia Procedure Notes (Signed)
Procedure Name: Intubation Date/Time: 08/11/2022 2:04 PM  Performed by: Audie Pinto, CRNAPre-anesthesia Checklist: Patient identified, Emergency Drugs available, Suction available and Patient being monitored Patient Re-evaluated:Patient Re-evaluated prior to induction Oxygen Delivery Method: Circle system utilized Preoxygenation: Pre-oxygenation with 100% oxygen Induction Type: IV induction Ventilation: Mask ventilation without difficulty Laryngoscope Size: Glidescope and 3 Grade View: Grade I Tube type: Oral Tube size: 7.5 mm Number of attempts: 1 Airway Equipment and Method: Stylet and Oral airway Placement Confirmation: ETT inserted through vocal cords under direct vision, positive ETCO2 and breath sounds checked- equal and bilateral Secured at: 22 cm Tube secured with: Tape Dental Injury: Teeth and Oropharynx as per pre-operative assessment  Comments: Elective glidescope - hx glidescope intubation

## 2022-08-12 DIAGNOSIS — S72001A Fracture of unspecified part of neck of right femur, initial encounter for closed fracture: Secondary | ICD-10-CM

## 2022-08-12 LAB — RENAL FUNCTION PANEL
Albumin: 3.2 g/dL — ABNORMAL LOW (ref 3.5–5.0)
Anion gap: 9 (ref 5–15)
BUN: 56 mg/dL — ABNORMAL HIGH (ref 8–23)
CO2: 24 mmol/L (ref 22–32)
Calcium: 8.4 mg/dL — ABNORMAL LOW (ref 8.9–10.3)
Chloride: 101 mmol/L (ref 98–111)
Creatinine, Ser: 2.79 mg/dL — ABNORMAL HIGH (ref 0.61–1.24)
GFR, Estimated: 22 mL/min — ABNORMAL LOW (ref >60)
Glucose, Bld: 181 mg/dL — ABNORMAL HIGH (ref 70–99)
Phosphorus: 4.6 mg/dL (ref 2.5–4.6)
Potassium: 4.9 mmol/L (ref 3.5–5.1)
Sodium: 134 mmol/L — ABNORMAL LOW (ref 135–145)

## 2022-08-12 LAB — CBC WITH DIFFERENTIAL/PLATELET
Abs Immature Granulocytes: 0.02 10*3/uL (ref 0.00–0.07)
Basophils Absolute: 0 10*3/uL (ref 0.0–0.1)
Basophils Relative: 0 %
Eosinophils Absolute: 0 10*3/uL (ref 0.0–0.5)
Eosinophils Relative: 0 %
HCT: 29.8 % — ABNORMAL LOW (ref 39.0–52.0)
Hemoglobin: 10.1 g/dL — ABNORMAL LOW (ref 13.0–17.0)
Immature Granulocytes: 0 %
Lymphocytes Relative: 12 %
Lymphs Abs: 1.1 10*3/uL (ref 0.7–4.0)
MCH: 30 pg (ref 26.0–34.0)
MCHC: 33.9 g/dL (ref 30.0–36.0)
MCV: 88.4 fL (ref 80.0–100.0)
Monocytes Absolute: 0.9 10*3/uL (ref 0.1–1.0)
Monocytes Relative: 9 %
Neutro Abs: 7.3 10*3/uL (ref 1.7–7.7)
Neutrophils Relative %: 79 %
Platelets: 197 10*3/uL (ref 150–400)
RBC: 3.37 MIL/uL — ABNORMAL LOW (ref 4.22–5.81)
RDW: 13.9 % (ref 11.5–15.5)
WBC: 9.4 10*3/uL (ref 4.0–10.5)
nRBC: 0 % (ref 0.0–0.2)

## 2022-08-12 LAB — GLUCOSE, CAPILLARY
Glucose-Capillary: 157 mg/dL — ABNORMAL HIGH (ref 70–99)
Glucose-Capillary: 134 mg/dL — ABNORMAL HIGH (ref 70–99)
Glucose-Capillary: 132 mg/dL — ABNORMAL HIGH (ref 70–99)
Glucose-Capillary: 163 mg/dL — ABNORMAL HIGH (ref 70–99)

## 2022-08-12 LAB — MAGNESIUM: Magnesium: 1.6 mg/dL — ABNORMAL LOW (ref 1.7–2.4)

## 2022-08-12 MED ORDER — LIDOCAINE 5 % EX PTCH
1.0000 | MEDICATED_PATCH | CUTANEOUS | Status: DC
  Administered 2022-08-12 – 2022-08-16 (×4): 1 via TRANSDERMAL
  Filled 2022-08-12 (×5): qty 1

## 2022-08-12 MED ORDER — HYDROCODONE-ACETAMINOPHEN 7.5-325 MG PO TABS
1.0000 | ORAL_TABLET | Freq: Four times a day (QID) | ORAL | Status: DC | PRN
  Administered 2022-08-12 – 2022-08-13 (×4): 2 via ORAL
  Filled 2022-08-12 (×5): qty 2

## 2022-08-12 NOTE — Progress Notes (Signed)
Subjective: 1 Day Post-Op s/p Procedure(s): INTRAMEDULLARY (IM) NAIL INTERTROCHANTERIC   Patient is alert, oriented. Reports pain control has been difficult, IV pain meds take away pain. Hydrocodone lessons pain but pain continues to be moderate-severe. Voiding and passing flatus.  Denies chest pain, SOB, Calf pain. No nausea/vomiting. No other complaints.   Objective:  PE: VITALS:   Vitals:   08/11/22 2046 08/12/22 0100 08/12/22 0421 08/12/22 0730  BP: (!) 92/58 (!) 91/55 103/62 (!) 100/56  Pulse: 77 69 66 69  Resp: 18 16 16 18   Temp:  98 F (36.7 C) (!) 97.5 F (36.4 C) 98.6 F (37 C)  TempSrc:  Oral Oral Oral  SpO2: 99% 96% 94% 99%  Weight:      Height:        ABD soft Sensation intact distally Intact pulses distally Dorsiflexion/Plantar flexion intact Incision: dressing C/D/I  LABS  Results for orders placed or performed during the hospital encounter of 08/10/22 (from the past 24 hour(s))  Glucose, capillary     Status: Abnormal   Collection Time: 08/11/22 11:37 AM  Result Value Ref Range   Glucose-Capillary 112 (H) 70 - 99 mg/dL  Glucose, capillary     Status: None   Collection Time: 08/11/22  1:50 PM  Result Value Ref Range   Glucose-Capillary 98 70 - 99 mg/dL  Glucose, capillary     Status: Abnormal   Collection Time: 08/11/22  3:27 PM  Result Value Ref Range   Glucose-Capillary 111 (H) 70 - 99 mg/dL  Glucose, capillary     Status: Abnormal   Collection Time: 08/11/22  9:37 PM  Result Value Ref Range   Glucose-Capillary 217 (H) 70 - 99 mg/dL  Renal function panel     Status: Abnormal   Collection Time: 08/12/22  2:29 AM  Result Value Ref Range   Sodium 134 (L) 135 - 145 mmol/L   Potassium 4.9 3.5 - 5.1 mmol/L   Chloride 101 98 - 111 mmol/L   CO2 24 22 - 32 mmol/L   Glucose, Bld 181 (H) 70 - 99 mg/dL   BUN 56 (H) 8 - 23 mg/dL   Creatinine, Ser 0.45 (H) 0.61 - 1.24 mg/dL   Calcium 8.4 (L) 8.9 - 10.3 mg/dL   Phosphorus 4.6 2.5 - 4.6 mg/dL    Albumin 3.2 (L) 3.5 - 5.0 g/dL   GFR, Estimated 22 (L) >60 mL/min   Anion gap 9 5 - 15  CBC with Differential/Platelet     Status: Abnormal   Collection Time: 08/12/22  2:29 AM  Result Value Ref Range   WBC 9.4 4.0 - 10.5 K/uL   RBC 3.37 (L) 4.22 - 5.81 MIL/uL   Hemoglobin 10.1 (L) 13.0 - 17.0 g/dL   HCT 40.9 (L) 81.1 - 91.4 %   MCV 88.4 80.0 - 100.0 fL   MCH 30.0 26.0 - 34.0 pg   MCHC 33.9 30.0 - 36.0 g/dL   RDW 78.2 95.6 - 21.3 %   Platelets 197 150 - 400 K/uL   nRBC 0.0 0.0 - 0.2 %   Neutrophils Relative % 79 %   Neutro Abs 7.3 1.7 - 7.7 K/uL   Lymphocytes Relative 12 %   Lymphs Abs 1.1 0.7 - 4.0 K/uL   Monocytes Relative 9 %   Monocytes Absolute 0.9 0.1 - 1.0 K/uL   Eosinophils Relative 0 %   Eosinophils Absolute 0.0 0.0 - 0.5 K/uL   Basophils Relative 0 %   Basophils Absolute  0.0 0.0 - 0.1 K/uL   Immature Granulocytes 0 %   Abs Immature Granulocytes 0.02 0.00 - 0.07 K/uL  Magnesium     Status: Abnormal   Collection Time: 08/12/22  2:29 AM  Result Value Ref Range   Magnesium 1.6 (L) 1.7 - 2.4 mg/dL  Glucose, capillary     Status: Abnormal   Collection Time: 08/12/22  7:32 AM  Result Value Ref Range   Glucose-Capillary 157 (H) 70 - 99 mg/dL    DG HIP UNILAT W OR W/O PELVIS 2-3 VIEWS LEFT  Result Date: 08/11/2022 CLINICAL DATA:  [id number] Post-operative state [id number] EXAM: DG HIP (WITH OR WITHOUT PELVIS) 2-3V LEFT COMPARISON:  08/10/2022 FINDINGS: Postsurgical changes of left proximal femur intramedullary nail for an intertrochanteric fracture. Improved fracture alignment. No evidence of immediate hardware complication. IMPRESSION: Postsurgical changes of left proximal femur ORIF. No evidence of immediate hardware complication. Electronically Signed   By: Caprice Renshaw M.D.   On: 08/11/2022 17:11   DG HIP UNILAT WITH PELVIS 2-3 VIEWS LEFT  Result Date: 08/11/2022 CLINICAL DATA:  [id number] Elective surgery [id number] EXAM: DG HIP (WITH OR WITHOUT PELVIS) 2-3V LEFT COMPARISON:  CT  08/30/2022 FINDINGS: Intraoperative images during left proximal femur intramedullary nail for an intertrochanteric fracture. Improved fracture alignment. IMPRESSION: Intraoperative images during left proximal femur ORIF. Improved fracture alignment. No evidence of immediate hardware complication. Electronically Signed   By: Caprice Renshaw M.D.   On: 08/11/2022 15:31   DG C-Arm 1-60 Min-No Report  Result Date: 08/11/2022 Fluoroscopy was utilized by the requesting physician.  No radiographic interpretation.   ECHOCARDIOGRAM COMPLETE  Result Date: 08/11/2022    ECHOCARDIOGRAM REPORT   Patient Name:   Marco Acevedo Date of Exam: 08/11/2022 Medical Rec #:  [id number]      Height:       68.0 in Accession #:    [id number]     Weight:       191.0 lb Date of Birth:  06-01-1934       BSA:          2.004 m Patient Age:    88 years       BP:           124/70 mmHg Patient Gender: M              HR:           80 bpm. Exam Location:  Inpatient Procedure: 2D Echo, Cardiac Doppler, Color Doppler and Intracardiac            Opacification Agent Indications:    Pre-op  History:        Patient has prior history of Echocardiogram examinations, most                 recent 10/14/2017. CAD, COPD, Aortic stenosis; Risk Factors:Former                 Smoker.  Sonographer:    Lucy Antigua Referring Phys: Laurann Montana IMPRESSIONS  1. Technically difficult; aortic valve not well visualized but calcified with reduced cusp excursion; likely moderate AS (mean gradient 23 mmHg; DI 0.30; AVA 0.9 cm2 but likely underestimated).  2. Left ventricular ejection fraction, by estimation, is 55 to 60%. The left ventricle has normal function. The left ventricle has no regional wall motion abnormalities. Left ventricular diastolic parameters are consistent with Grade I diastolic dysfunction (impaired relaxation). Elevated left atrial pressure.  3. Right ventricular systolic function is normal. The right  ventricular size is normal.  4. The mitral valve is normal  in structure. No evidence of mitral valve regurgitation. No evidence of mitral stenosis.  5. The aortic valve has an indeterminant number of cusps. Aortic valve regurgitation is moderate to severe. Moderate to severe aortic valve stenosis.  6. The inferior vena cava is normal in size with greater than 50% respiratory variability, suggesting right atrial pressure of 3 mmHg. FINDINGS  Left Ventricle: Left ventricular ejection fraction, by estimation, is 55 to 60%. The left ventricle has normal function. The left ventricle has no regional wall motion abnormalities. The left ventricular internal cavity size was normal in size. There is  no left ventricular hypertrophy. Left ventricular diastolic parameters are consistent with Grade I diastolic dysfunction (impaired relaxation). Elevated left atrial pressure. Right Ventricle: The right ventricular size is normal. No increase in right ventricular wall thickness. Right ventricular systolic function is normal. Left Atrium: Left atrial size was normal in size. Right Atrium: Right atrial size was normal in size. Pericardium: There is no evidence of pericardial effusion. Mitral Valve: The mitral valve is normal in structure. Mild mitral annular calcification. No evidence of mitral valve regurgitation. No evidence of mitral valve stenosis. Tricuspid Valve: The tricuspid valve is normal in structure. Tricuspid valve regurgitation is trivial. No evidence of tricuspid stenosis. Aortic Valve: The aortic valve has an indeterminant number of cusps. Aortic valve regurgitation is moderate to severe. Moderate to severe aortic stenosis is present. Aortic valve mean gradient measures 23.0 mmHg. Aortic valve peak gradient measures 36.0 mmHg. Aortic valve area, by VTI measures 0.84 cm. Pulmonic Valve: The pulmonic valve was not well visualized. Pulmonic valve regurgitation is not visualized. No evidence of pulmonic stenosis. Aorta: The aortic root is normal in size and structure. Venous:  The inferior vena cava is normal in size with greater than 50% respiratory variability, suggesting right atrial pressure of 3 mmHg. IAS/Shunts: No atrial level shunt detected by color flow Doppler. Additional Comments: Technically difficult; aortic valve not well visualized but calcified with reduced cusp excursion; likely moderate AS (mean gradient 23 mmHg; DI 0.30; AVA 0.9 cm2 but likely underestimated).  LEFT VENTRICLE PLAX 2D LVOT diam:     1.90 cm   Diastology LV SV:         61        LV e' medial:    51.45 cm/s LV SV Index:   30        LV E/e' medial:  1.9 LVOT Area:     2.84 cm  LV e' lateral:   5.33 cm/s                          LV E/e' lateral: 18.2  RIGHT VENTRICLE             IVC RV S prime:     15.20 cm/s  IVC diam: 2.20 cm TAPSE (M-mode): 2.6 cm LEFT ATRIUM             Index        RIGHT ATRIUM           Index LA Vol (A2C):   78.1 ml 38.97 ml/m  RA Area:     11.50 cm LA Vol (A4C):   40.2 ml 20.06 ml/m  RA Volume:   20.20 ml  10.08 ml/m LA Biplane Vol: 61.0 ml 30.44 ml/m  AORTIC VALVE AV Area (Vmax):    0.92 cm AV Area (Vmean):  0.85 cm AV Area (VTI):     0.84 cm AV Vmax:           300.00 cm/s AV Vmean:          226.000 cm/s AV VTI:            0.719 m AV Peak Grad:      36.0 mmHg AV Mean Grad:      23.0 mmHg LVOT Vmax:         97.40 cm/s LVOT Vmean:        67.600 cm/s LVOT VTI:          0.214 m LVOT/AV VTI ratio: 0.30  AORTA Ao Root diam: 2.60 cm Ao Asc diam:  2.20 cm MITRAL VALVE MV Area (PHT): 2.75 cm     SHUNTS MV Decel Time: 276 msec     Systemic VTI:  0.21 m MV E velocity: 96.90 cm/s   Systemic Diam: 1.90 cm MV A velocity: 135.00 cm/s MV E/A ratio:  0.72 Olga Millers MD Electronically signed by Olga Millers MD Signature Date/Time: 08/11/2022/9:36:04 AM    Final    DG Wrist Complete Left  Result Date: 08/10/2022 CLINICAL DATA:  Trauma, fall EXAM: LEFT WRIST - COMPLETE 3+ VIEW COMPARISON:  None Available. FINDINGS: No recent fracture or dislocation is seen. Subcortical cyst is seen in  lunate. Bony spurs are seen in first carpometacarpal and first metacarpophalangeal joints. Arterial calcifications are seen in soft tissues. IMPRESSION: No recent fracture or dislocation is seen in left wrist. Electronically Signed   By: Ernie Avena M.D.   On: 08/10/2022 15:49   DG Chest Portable 1 View  Result Date: 08/10/2022 CLINICAL DATA:  Trauma, fall EXAM: PORTABLE CHEST 1 VIEW COMPARISON:  02/23/2019 FINDINGS: Cardiac size is in upper limits of normal. There are no signs of pulmonary edema or focal pulmonary consolidation. Patchy infiltrate in right lower lung field seen in the earlier CT could not be clearly identified on the radiograph. There is no pleural effusion or pneumothorax. Air soft tissue interface seen in the lateral aspect of left lower lung field may be due to skin fold. IMPRESSION: There are no signs of pulmonary edema or focal pulmonary consolidation. Electronically Signed   By: Ernie Avena M.D.   On: 08/10/2022 15:45   CT CHEST ABDOMEN PELVIS WO CONTRAST  Result Date: 08/10/2022 CLINICAL DATA:  Trauma, fall EXAM: CT CHEST, ABDOMEN AND PELVIS WITHOUT CONTRAST TECHNIQUE: Multidetector CT imaging of the chest, abdomen and pelvis was performed following the standard protocol without IV contrast. RADIATION DOSE REDUCTION: This exam was performed according to the departmental dose-optimization program which includes automated exposure control, adjustment of the mA and/or kV according to patient size and/or use of iterative reconstruction technique. COMPARISON:  CT pelvis done on 02/12/2021, CT abdomen and pelvis done on 10/17/2012 FINDINGS: CT CHEST FINDINGS Cardiovascular: Coronary artery calcifications are seen. Scattered calcifications are seen in thoracic aorta and its major branches. Mediastinum/Nodes: No acute findings are seen. Lungs/Pleura: There are patchy alveolar infiltrates in posteromedial right lower lung field. Small linear densities are seen in left lower lung  field. There is no pleural effusion or pneumothorax. Musculoskeletal: No acute findings are seen in bony structures in the thorax. CT ABDOMEN PELVIS FINDINGS Hepatobiliary: Surgical clips are seen in gallbladder fossa. There is no dilation of bile ducts. Pancreas: No focal abnormalities are seen. Spleen: Unremarkable. Adrenals/Urinary Tract: Adrenals are unremarkable. There is no hydronephrosis. There are scattered calcifications in renal artery branches. No definite renal  or ureteral stones are seen. Urinary bladder is unremarkable. Stomach/Bowel: Stomach is unremarkable. Small bowel loops are not dilated. Appendix is not dilated. There is no significant wall thickening in colon. Multiple diverticula are seen in colon without signs of focal acute diverticulitis. Vascular/Lymphatic: Arterial calcifications are seen in aorta and its major branches. There is a 3 x 2.3 cm aneurysm in the right common iliac artery. There is 2.3 x 2.2 cm aneurysm in the left common iliac artery. Reproductive: Prostate is enlarged projecting into the base of the bladder. There are scattered calcifications in prostate. Other: There is no ascites or pneumoperitoneum. Small umbilical hernia containing fat is seen. Small bilateral inguinal hernias containing fat are noted. Musculoskeletal: There is comminuted intertrochanteric fracture in proximal left femur. There is no dislocation. Degenerative changes are noted in both hips. There is large Schmorl's node in the upper endplate of body of L4 vertebra. Degenerative changes are noted in thoracic and lumbar spine. IMPRESSION: Recent comminuted intertrochanteric fracture is seen in proximal left femur. No other definite recent fractures are seen. There is large Schmorl's node in the upper endplate of body of L4 vertebra. Degenerative changes are noted in thoracic and lumbar spine with bony spurs. Patchy alveolar infiltrate is seen in right lower lobe suggesting atelectasis/pneumonia. Small  linear densities in left lower lung field may suggest scarring or subsegmental atelectasis. There is no evidence of hematoma in mediastinum and retroperitoneal region. There is no demonstrable laceration in solid organs. There is no bowel wall thickening. Coronary artery disease. Aortic atherosclerosis. Aneurysmal dilation is seen in both common iliac arteries, larger on the right side. Diverticulosis of colon without signs of focal diverticulitis. Enlarged prostate. Electronically Signed   By: Ernie Avena M.D.   On: 08/10/2022 15:23   CT Head Wo Contrast  Result Date: 08/10/2022 CLINICAL DATA:  Head trauma, moderate-severe; Neck trauma (Age >= 65y). Mechanical fall. EXAM: CT HEAD WITHOUT CONTRAST CT CERVICAL SPINE WITHOUT CONTRAST TECHNIQUE: Multidetector CT imaging of the head and cervical spine was performed following the standard protocol without intravenous contrast. Multiplanar CT image reconstructions of the cervical spine were also generated. RADIATION DOSE REDUCTION: This exam was performed according to the departmental dose-optimization program which includes automated exposure control, adjustment of the mA and/or kV according to patient size and/or use of iterative reconstruction technique. COMPARISON:  Head CT 10/17/2017. FINDINGS: CT HEAD FINDINGS Brain: No acute hemorrhage. Unchanged moderate chronic small-vessel disease. Cortical gray-white differentiation is otherwise preserved. Prominence of the ventricles and sulci within expected range for age. No hydrocephalus or extra-axial collection. No mass effect or midline shift. Vascular: No hyperdense vessel or unexpected calcification. Skull: No calvarial fracture or suspicious bone lesion. Skull base is unremarkable. Sinuses/Orbits: Unremarkable. Other: None. CT CERVICAL SPINE FINDINGS Alignment: 3 mm degenerative anterolisthesis of C7 on T1. No traumatic malalignment. Skull base and vertebrae: No acute fracture. Normal craniocervical  junction. No suspicious bone lesions. Soft tissues and spinal canal: No prevertebral fluid or swelling. No visible canal hematoma. Disc levels: Multilevel lower cervical spondylosis and ossification of the posterior longitudinal ligament, worst at C4-5, where there is at least moderate spinal canal stenosis. Upper chest: Unremarkable. Other: None. IMPRESSION: 1. No acute intracranial abnormality. 2. No acute fracture or traumatic malalignment of the cervical spine. 3. Multilevel lower cervical spondylosis and ossification of the posterior longitudinal ligament, worst at C4-5, where there is at least moderate spinal canal stenosis. Electronically Signed   By: Orvan Falconer M.D.   On: 08/10/2022 15:05  CT Cervical Spine Wo Contrast  Result Date: 08/10/2022 CLINICAL DATA:  Head trauma, moderate-severe; Neck trauma (Age >= 65y). Mechanical fall. EXAM: CT HEAD WITHOUT CONTRAST CT CERVICAL SPINE WITHOUT CONTRAST TECHNIQUE: Multidetector CT imaging of the head and cervical spine was performed following the standard protocol without intravenous contrast. Multiplanar CT image reconstructions of the cervical spine were also generated. RADIATION DOSE REDUCTION: This exam was performed according to the departmental dose-optimization program which includes automated exposure control, adjustment of the mA and/or kV according to patient size and/or use of iterative reconstruction technique. COMPARISON:  Head CT 10/17/2017. FINDINGS: CT HEAD FINDINGS Brain: No acute hemorrhage. Unchanged moderate chronic small-vessel disease. Cortical gray-white differentiation is otherwise preserved. Prominence of the ventricles and sulci within expected range for age. No hydrocephalus or extra-axial collection. No mass effect or midline shift. Vascular: No hyperdense vessel or unexpected calcification. Skull: No calvarial fracture or suspicious bone lesion. Skull base is unremarkable. Sinuses/Orbits: Unremarkable. Other: None. CT CERVICAL  SPINE FINDINGS Alignment: 3 mm degenerative anterolisthesis of C7 on T1. No traumatic malalignment. Skull base and vertebrae: No acute fracture. Normal craniocervical junction. No suspicious bone lesions. Soft tissues and spinal canal: No prevertebral fluid or swelling. No visible canal hematoma. Disc levels: Multilevel lower cervical spondylosis and ossification of the posterior longitudinal ligament, worst at C4-5, where there is at least moderate spinal canal stenosis. Upper chest: Unremarkable. Other: None. IMPRESSION: 1. No acute intracranial abnormality. 2. No acute fracture or traumatic malalignment of the cervical spine. 3. Multilevel lower cervical spondylosis and ossification of the posterior longitudinal ligament, worst at C4-5, where there is at least moderate spinal canal stenosis. Electronically Signed   By: Orvan Falconer M.D.   On: 08/10/2022 15:05   DG Femur Min 2 Views Left  Result Date: 08/10/2022 CLINICAL DATA:  Fall, left leg pain EXAM: LEFT FEMUR 2 VIEWS COMPARISON:  Pelvic radiographs 08/10/2022 FINDINGS: Acute intertrochanteric fracture of the left proximal femur. No significant angulation. Vascular calcifications noted. IMPRESSION: 1. Acute intertrochanteric fracture of the left proximal femur. Electronically Signed   By: Gaylyn Rong M.D.   On: 08/10/2022 14:42   DG Elbow Complete Right  Result Date: 08/10/2022 CLINICAL DATA:  Fall, right elbow injury EXAM: RIGHT ELBOW - COMPLETE 3+ VIEW COMPARISON:  None Available. FINDINGS: IV tubing noted. Mild epicondylar spurring medially and laterally. Mild spurring along the coronoid process. Accounting for obliquity, no elbow joint effusion or visible fracture. Supinator fat pad unremarkable. IMPRESSION: 1. Mild degenerative findings. No fracture or joint effusion identified. Electronically Signed   By: Gaylyn Rong M.D.   On: 08/10/2022 14:40   DG Pelvis Portable  Result Date: 08/10/2022 CLINICAL DATA:  [place]   Left leg pain. EXAM: PORTABLE PELVIS 1-2 VIEWS COMPARISON:  CT pelvis 09/12/2020 FINDINGS: Mild axial articular space narrowing in both hips. Spurring and subcortical cyst formation along the left upper acetabulum. No appreciable fracture or acute bony findings. Vascular calcifications noted. IMPRESSION: 1. Mild degenerative findings in both hips. No acute findings. Electronically Signed   By: Gaylyn Rong M.D.   On: 08/10/2022 14:39    Assessment/Plan: Left hip fracture  1 Day Post-Op s/p Procedure(s): INTRAMEDULLARY (IM) NAIL INTERTROCHANTERIC  WB: WBAT LLE Abx: ancef x23 hours post op Imaging: PACU xrays - stable Dressing: keep intact until follow up, change PRN if soiled or saturated. DVT prophylaxis: lovenox starting POD1 x4 weeks Follow up: 2 weeks after surgery for a wound check with Dr. Blanchie Dessert at [facility].  Address: [address], [place], [place] [address]  Office Phone: [phone] Dispo: pending PT/OT evals today, likely to SNF  Armida Sans 08/12/2022, 8:39 AM

## 2022-08-12 NOTE — Progress Notes (Signed)
PT Cancellation Note  Patient Details Name: Marco Acevedo MRN: [id number] DOB: Apr 26, 1934   Cancelled Treatment:    Reason Eval/Treat Not Completed: Other (comment).  Eval initiated, stopped for meds and will return at another time.   Ivar Drape 08/12/2022, 10:37 AM  Samul Dada, PT PhD Acute Rehab Dept. Number: [facility] [id number] and [facility] [phone]

## 2022-08-12 NOTE — Progress Notes (Signed)
Physical Therapy Evaluation Patient Details Name: Marco Acevedo MRN: [id number] DOB: 02-08-1935 Today's Date: 08/12/2022  History of Present Illness  88 yo male with [place] of fall [place] was admitted on 5/2 for management of his L hip fracture with IM nailing.  Pt is mainly having issues with pain, but is motivated to move.  PMHx:  CAD, percutaneous coronary angioplasty, fall, heart stents, CHF, CKD3b, DM, COPD, GERD  Clinical Impression  Pt was seen for mobility on RW with help to support standing and cues for safety of walker use.  Pt is initially very unsteady and was able to get through control of walker to min guard level at end of session.  Transfers up and down are mod assist, bed mob mod assist.  Therefore, recommending to get >3 hours a day therapy to follow up as pt is making a good effort initially and will be a good candidate for progression to home with family to follow up.       Recommendations for follow up therapy are one component of a multi-disciplinary discharge planning process, led by the attending physician.  Recommendations may be updated based on patient status, additional functional criteria and insurance authorization.  Follow Up Recommendations       Assistance Recommended at Discharge Frequent or constant Supervision/Assistance  Patient can return home with the following  A lot of help with walking and/or transfers;A lot of help with bathing/dressing/bathroom;Assistance with cooking/housework;Assist for transportation;Help with stairs or ramp for entrance    Equipment Recommendations Rolling walker (2 wheels)  Recommendations for Other Services  Rehab consult    Functional Status Assessment Patient has had a recent decline in their functional status and demonstrates the ability to make significant improvements in function in a reasonable and predictable amount of time.     Precautions / Restrictions Precautions Precautions: Fall Precaution Comments:  WBAT, no precautions Restrictions Weight Bearing Restrictions: Yes LLE Weight Bearing: Weight bearing as tolerated      Mobility  Bed Mobility Overal bed mobility: Needs Assistance Bed Mobility: Supine to Sit, Sit to Supine     Supine to sit: Mod assist     General bed mobility comments: mod to support LLE and assist to lift trunk    Transfers Overall transfer level: Needs assistance Equipment used: Rolling walker (2 wheels) Transfers: Sit to/from Stand Sit to Stand: Mod assist           General transfer comment: mod assist from bed with elevation    Ambulation/Gait Ambulation/Gait assistance: Min assist Gait Distance (Feet): 30 Feet (8+22) Assistive device: Rolling walker (2 wheels), 1 person hand held assist Gait Pattern/deviations: Step-through pattern, Decreased stride length, Decreased weight shift to left, Wide base of support Gait velocity: reduced Gait velocity interpretation: <1.31 ft/sec, indicative of household ambulator Pre-gait activities: standing balance and control of posture and walker General Gait Details: pt took extra time wiht RW and could both sidestep with help and walk around the bed  Stairs            Wheelchair Mobility    Modified Rankin (Stroke Patients Only)       Balance Overall balance assessment: Needs assistance, History of Falls Sitting-balance support: Feet supported Sitting balance-Leahy Scale: Good     Standing balance support: Bilateral upper extremity supported, During functional activity Standing balance-Leahy Scale: Poor  Pertinent Vitals/Pain Pain Assessment Pain Assessment: Faces Faces Pain Scale: Hurts little more Pain Location: L hip Pain Descriptors / Indicators: Guarding, Operative site guarding Pain Intervention(s): Limited activity within patient's tolerance, Premedicated before session, Monitored during session, Repositioned    Home Living  Family/patient expects to be discharged to:: Private residence Living Arrangements: Spouse/significant other Available Help at Discharge: Family;Available 24 hours/day Type of Home: House         Home Layout: One level Home Equipment: Agricultural consultant (2 wheels);Cane - quad;Shower seat - built in;Grab bars - tub/shower      Prior Function Prior Level of Function : Independent/Modified Independent             Mobility Comments: was out golfing the day of injury       Hand Dominance   Dominant Hand: Right    Extremity/Trunk Assessment   Upper Extremity Assessment Upper Extremity Assessment: Overall WFL for tasks assessed    Lower Extremity Assessment Lower Extremity Assessment: LLE deficits/detail LLE Deficits / Details: generally weak on hip and cannot move without support x with gait LLE: Unable to fully assess due to pain LLE Coordination: decreased gross motor    Cervical / Trunk Assessment Cervical / Trunk Assessment: Kyphotic (mild)  Communication   Communication: No difficulties  Cognition Arousal/Alertness: Awake/alert Behavior During Therapy: WFL for tasks assessed/performed Overall Cognitive Status: Within Functional Limits for tasks assessed                                 General Comments: initially very anxious about trying to move, reattempted after pain meds        General Comments General comments (skin integrity, edema, etc.): pt is assisted to sit up and walk with RW, more capable the second time as compared to first    Exercises     Assessment/Plan    PT Assessment Patient needs continued PT services  PT Problem List Decreased strength;Decreased range of motion;Decreased activity tolerance;Decreased balance;Decreased mobility;Decreased coordination;Decreased safety awareness;Decreased skin integrity;Pain;Obesity       PT Treatment Interventions DME instruction;Gait training;Functional mobility training;Therapeutic  activities;Therapeutic exercise;Balance training;Neuromuscular re-education;Patient/family education    PT Goals (Current goals can be found in the Care Plan section)  Acute Rehab PT Goals Patient Stated Goal: to get home and back to usual activities PT Goal Formulation: With patient Time For Goal Achievement: 08/26/22 Potential to Achieve Goals: Good    Frequency Min 3X/week     Co-evaluation               AM-PAC PT "6 Clicks" Mobility  Outcome Measure Help needed turning from your back to your side while in a flat bed without using bedrails?: A Little Help needed moving from lying on your back to sitting on the side of a flat bed without using bedrails?: A Lot Help needed moving to and from a bed to a chair (including a wheelchair)?: A Lot Help needed standing up from a chair using your arms (e.g., wheelchair or bedside chair)?: A Lot Help needed to walk in hospital room?: A Little Help needed climbing 3-5 steps with a railing? : Total 6 Click Score: 13    End of Session Equipment Utilized During Treatment: Gait belt Activity Tolerance: Patient limited by fatigue;Patient limited by pain Patient left: in chair;with call bell/phone within reach;with chair alarm set Nurse Communication: Mobility status PT Visit Diagnosis: Unsteadiness on feet (R26.81);Muscle weakness (generalized) (M62.81);Pain Pain -  Right/Left: Left Pain - part of body: Hip    Time: 1610-9604 ([id number]) PT Time Calculation (min) (ACUTE ONLY): 33 min   Charges:   PT Evaluation $PT Eval Moderate Complexity: 1 Mod PT Treatments $Gait Training: 8-22 mins $Therapeutic Activity: 8-22 mins       Ivar Drape 08/12/2022, 4:09 PM  Samul Dada, PT PhD Acute Rehab Dept. Number: [facility] [id number] and [facility] [phone]

## 2022-08-12 NOTE — Progress Notes (Signed)
PROGRESS NOTE    DERMOT REVERS  HQI:[id number] DOB: November 12, 1934 DOA: 08/10/2022 PCP: Daisy Floro, MD  Outpatient Specialists:     Brief Narrative:  Patient is an 88 year old male with history of coronary artery disease status post stenting x 2, chronic heart failure with preserved ejection fraction, chronic kidney disease stage IIIb, type 2 diabetes mellitus, COPD and GERD.  Patient was admitted with left hip fracture following a fall.  Cardiology team's input is appreciated.  Patient has been cleared for surgery.  Patient is awaiting surgery this afternoon.  Echocardiogram revealed mild to moderate aortic stenosis and regurgitation, normal ejection fraction and grade 1 diastolic dysfunction.  08/11/2022: Patient seen.  No new complaints.  No chest pain, no shortness of breath.  Patient was fairly active prior to the fall.  Patient's son was updated.  For surgery this afternoon.  08/12/2022: Patient underwent intramedullary nailing yesterday, 08/11/2022.  Pain is not adequately controlled.  Pain seems to be limiting ability to perform physical therapy.  Will use lidocaine patch for the pain involving the left buttock area.   Assessment & Plan:   Principal Problem:   Hip fracture (HCC) Active Problems:   Left displaced femoral neck fracture (HCC)   CAD S/P percutaneous coronary angioplasty   Fall   Left intertrochanteric femoral fracture -S/p intertrochanteric nailing  -Optimize pain control.   -Postop management will be as per orthopedic team.     History of CAD -Stable.  No chest pain. -Continue aspirin and statin and beta-blocker   CKD stage IIIb -Euvolemic, creatinine at baseline   IIDM -SSI for now   COPD -Stable   GERD -Poorly controlled, continue PPI and sucralfate   DVT prophylaxis: Lovenox. Code Status: Full code. Family Communication: Son. Disposition Plan: This will depend on hospital course.   Consultants:  Cardiology Orthopedics.  Procedures:   For ORIF of left hip fracture this afternoon  Antimicrobials:  None   Subjective: Left hip pain is controlled.  Left buttock pain persists.  Objective: Vitals:   08/11/22 2046 08/12/22 0100 08/12/22 0421 08/12/22 0730  BP: (!) 92/58 (!) 91/55 103/62 (!) 100/56  Pulse: 77 69 66 69  Resp: 18 16 16 18   Temp:  98 F (36.7 C) (!) 97.5 F (36.4 C) 98.6 F (37 C)  TempSrc:  Oral Oral Oral  SpO2: 99% 96% 94% 99%  Weight:      Height:        Intake/Output Summary (Last 24 hours) at 08/12/2022 1442 Last data filed at 08/12/2022 0733 Gross per 24 hour  Intake 890 ml  Output 150 ml  Net 740 ml   Filed Weights   08/10/22 1342 08/11/22 1151  Weight: 86.6 kg 87 kg    Examination:  General exam: Appears calm and comfortable  Respiratory system: Clear to auscultation.  Cardiovascular system: S1 & S2 heard, systolic murmur.   Gastrointestinal system: Abdomen is nondistended, soft and nontender. No organomegaly or masses felt. Normal bowel sounds heard. Central nervous system: Alert and oriented.  Moves all extremities.   Extremities: No leg edema.  Data Reviewed: I have personally reviewed following labs and imaging studies  CBC: Recent Labs  Lab 08/10/22 1340 08/10/22 1441 08/12/22 0229  WBC 7.8  --  9.4  NEUTROABS 5.7  --  7.3  HGB 15.0 15.3 10.1*  HCT 45.0 45.0 29.8*  MCV 87.9  --  88.4  PLT 244  --  197    Basic Metabolic Panel: Recent Labs  Lab  08/10/22 1340 08/10/22 1441 08/12/22 0229  NA 137 140 134*  K 4.5 4.5 4.9  CL 105 106 101  CO2 22  --  24  GLUCOSE 113* 110* 181*  BUN 54* 51* 56*  CREATININE 2.58* 2.90* 2.79*  CALCIUM 9.3  --  8.4*  MG  --   --  1.6*  PHOS  --   --  4.6    GFR: Estimated Creatinine Clearance: 20.8 mL/min (A) (by C-G formula based on SCr of 2.79 mg/dL (H)). Liver Function Tests: Recent Labs  Lab 08/12/22 0229  ALBUMIN 3.2*   No results for input(s): "LIPASE", "AMYLASE" in the last 168 hours. No results for input(s):  "AMMONIA" in the last 168 hours. Coagulation Profile: Recent Labs  Lab 08/10/22 1340  INR 1.0    Cardiac Enzymes: No results for input(s): "CKTOTAL", "CKMB", "CKMBINDEX", "TROPONINI" in the last 168 hours. BNP (last 3 results) No results for input(s): "PROBNP" in the last 8760 hours. HbA1C: Recent Labs    08/10/22 1807  HGBA1C 6.5*    CBG: Recent Labs  Lab 08/11/22 1350 08/11/22 1527 08/11/22 2137 08/12/22 0732 08/12/22 1154  GLUCAP 98 111* 217* 157* 134*    Lipid Profile: No results for input(s): "CHOL", "HDL", "LDLCALC", "TRIG", "CHOLHDL", "LDLDIRECT" in the last 72 hours. Thyroid Function Tests: No results for input(s): "TSH", "T4TOTAL", "FREET4", "T3FREE", "THYROIDAB" in the last 72 hours. Anemia Panel: No results for input(s): "VITAMINB12", "FOLATE", "FERRITIN", "TIBC", "IRON", "RETICCTPCT" in the last 72 hours. Urine analysis:    Component Value Date/Time   COLORURINE YELLOW (A) 10/19/2017 1008   APPEARANCEUR CLEAR (A) 10/19/2017 1008   LABSPEC 1.025 10/19/2017 1008   PHURINE 5.0 10/19/2017 1008   GLUCOSEU NEGATIVE 10/19/2017 1008   HGBUR NEGATIVE 10/19/2017 1008   BILIRUBINUR NEGATIVE 10/19/2017 1008   KETONESUR NEGATIVE 10/19/2017 1008   PROTEINUR 30 (A) 10/19/2017 1008   NITRITE NEGATIVE 10/19/2017 1008   LEUKOCYTESUR NEGATIVE 10/19/2017 1008   Sepsis Labs: @LABRCNTIP (procalcitonin:4,lacticidven:4)  ) Recent Results (from the past 240 hour(s))  Surgical pcr screen     Status: None   Collection Time: 08/11/22  6:51 AM   Specimen: Nasal Mucosa; Nasal Swab  Result Value Ref Range Status   MRSA, PCR NEGATIVE NEGATIVE Final   Staphylococcus aureus NEGATIVE NEGATIVE Final    Comment: (NOTE) The Xpert SA Assay (FDA approved for NASAL specimens in patients 69 years of age and older), is one component of a comprehensive surveillance program. It is not intended to diagnose infection nor to guide or monitor treatment. Performed at [facility]  [facility], [address] [place], [place] [address]          Radiology Studies: DG HIP Lucienne Capers OR W/O PELVIS 2-3 VIEWS LEFT  Result Date: 08/11/2022 CLINICAL DATA:  [id number] Post-operative state 252351 EXAM: DG HIP (WITH OR WITHOUT PELVIS) 2-3V LEFT COMPARISON:  08/10/2022 FINDINGS: Postsurgical changes of left proximal femur intramedullary nail for an intertrochanteric fracture. Improved fracture alignment. No evidence of immediate hardware complication. IMPRESSION: Postsurgical changes of left proximal femur ORIF. No evidence of immediate hardware complication. Electronically Signed   By: Caprice Renshaw M.D.   On: 08/11/2022 17:11   DG HIP UNILAT WITH PELVIS 2-3 VIEWS LEFT  Result Date: 08/11/2022 CLINICAL DATA:  [id number] Elective surgery [id number] EXAM: DG HIP (WITH OR WITHOUT PELVIS) 2-3V LEFT COMPARISON:  CT 08/30/2022 FINDINGS: Intraoperative images during left proximal femur intramedullary nail for an intertrochanteric fracture. Improved fracture alignment. IMPRESSION: Intraoperative images during left proximal  femur ORIF. Improved fracture alignment. No evidence of immediate hardware complication. Electronically Signed   By: Caprice Renshaw M.D.   On: 08/11/2022 15:31   DG C-Arm 1-60 Min-No Report  Result Date: 08/11/2022 Fluoroscopy was utilized by the requesting physician.  No radiographic interpretation.   ECHOCARDIOGRAM COMPLETE  Result Date: 08/11/2022    ECHOCARDIOGRAM REPORT   Patient Name:   ADETOKUNBO BOLLENBACH Date of Exam: 08/11/2022 Medical Rec #:  [id number]      Height:       68.0 in Accession #:    [id number]     Weight:       191.0 lb Date of Birth:  [birth year]       BSA:          2.004 m Patient Age:    87 years       BP:           124/70 mmHg Patient Gender: M              HR:           80 bpm. Exam Location:  Inpatient Procedure: 2D Echo, Cardiac Doppler, Color Doppler and Intracardiac            Opacification Agent Indications:    Pre-op  History:        Patient has prior history of Echocardiogram  examinations, most                 recent 10/14/2017. CAD, COPD, Aortic stenosis; Risk Factors:Former                 Smoker.  Sonographer:    Lucy Antigua Referring Phys: Laurann Montana IMPRESSIONS  1. Technically difficult; aortic valve not well visualized but calcified with reduced cusp excursion; likely moderate AS (mean gradient 23 mmHg; DI 0.30; AVA 0.9 cm2 but likely underestimated).  2. Left ventricular ejection fraction, by estimation, is 55 to 60%. The left ventricle has normal function. The left ventricle has no regional wall motion abnormalities. Left ventricular diastolic parameters are consistent with Grade I diastolic dysfunction (impaired relaxation). Elevated left atrial pressure.  3. Right ventricular systolic function is normal. The right ventricular size is normal.  4. The mitral valve is normal in structure. No evidence of mitral valve regurgitation. No evidence of mitral stenosis.  5. The aortic valve has an indeterminant number of cusps. Aortic valve regurgitation is moderate to severe. Moderate to severe aortic valve stenosis.  6. The inferior vena cava is normal in size with greater than 50% respiratory variability, suggesting right atrial pressure of 3 mmHg. FINDINGS  Left Ventricle: Left ventricular ejection fraction, by estimation, is 55 to 60%. The left ventricle has normal function. The left ventricle has no regional wall motion abnormalities. The left ventricular internal cavity size was normal in size. There is  no left ventricular hypertrophy. Left ventricular diastolic parameters are consistent with Grade I diastolic dysfunction (impaired relaxation). Elevated left atrial pressure. Right Ventricle: The right ventricular size is normal. No increase in right ventricular wall thickness. Right ventricular systolic function is normal. Left Atrium: Left atrial size was normal in size. Right Atrium: Right atrial size was normal in size. Pericardium: There is no evidence of pericardial  effusion. Mitral Valve: The mitral valve is normal in structure. Mild mitral annular calcification. No evidence of mitral valve regurgitation. No evidence of mitral valve stenosis. Tricuspid Valve: The tricuspid valve is normal in structure. Tricuspid valve regurgitation is trivial. No evidence of  tricuspid stenosis. Aortic Valve: The aortic valve has an indeterminant number of cusps. Aortic valve regurgitation is moderate to severe. Moderate to severe aortic stenosis is present. Aortic valve mean gradient measures 23.0 mmHg. Aortic valve peak gradient measures 36.0 mmHg. Aortic valve area, by VTI measures 0.84 cm. Pulmonic Valve: The pulmonic valve was not well visualized. Pulmonic valve regurgitation is not visualized. No evidence of pulmonic stenosis. Aorta: The aortic root is normal in size and structure. Venous: The inferior vena cava is normal in size with greater than 50% respiratory variability, suggesting right atrial pressure of 3 mmHg. IAS/Shunts: No atrial level shunt detected by color flow Doppler. Additional Comments: Technically difficult; aortic valve not well visualized but calcified with reduced cusp excursion; likely moderate AS (mean gradient 23 mmHg; DI 0.30; AVA 0.9 cm2 but likely underestimated).  LEFT VENTRICLE PLAX 2D LVOT diam:     1.90 cm   Diastology LV SV:         61        LV e' medial:    51.45 cm/s LV SV Index:   30        LV E/e' medial:  1.9 LVOT Area:     2.84 cm  LV e' lateral:   5.33 cm/s                          LV E/e' lateral: 18.2  RIGHT VENTRICLE             IVC RV S prime:     15.20 cm/s  IVC diam: 2.20 cm TAPSE (M-mode): 2.6 cm LEFT ATRIUM             Index        RIGHT ATRIUM           Index LA Vol (A2C):   78.1 ml 38.97 ml/m  RA Area:     11.50 cm LA Vol (A4C):   40.2 ml 20.06 ml/m  RA Volume:   20.20 ml  10.08 ml/m LA Biplane Vol: 61.0 ml 30.44 ml/m  AORTIC VALVE AV Area (Vmax):    0.92 cm AV Area (Vmean):   0.85 cm AV Area (VTI):     0.84 cm AV Vmax:            300.00 cm/s AV Vmean:          226.000 cm/s AV VTI:            0.719 m AV Peak Grad:      36.0 mmHg AV Mean Grad:      23.0 mmHg LVOT Vmax:         97.40 cm/s LVOT Vmean:        67.600 cm/s LVOT VTI:          0.214 m LVOT/AV VTI ratio: 0.30  AORTA Ao Root diam: 2.60 cm Ao Asc diam:  2.20 cm MITRAL VALVE MV Area (PHT): 2.75 cm     SHUNTS MV Decel Time: 276 msec     Systemic VTI:  0.21 m MV E velocity: 96.90 cm/s   Systemic Diam: 1.90 cm MV A velocity: 135.00 cm/s MV E/A ratio:  0.72 Olga Millers MD Electronically signed by Olga Millers MD Signature Date/Time: 08/11/2022/9:36:04 AM    Final    DG Wrist Complete Left  Result Date: 08/10/2022 CLINICAL DATA:  Trauma, fall EXAM: LEFT WRIST - COMPLETE 3+ VIEW COMPARISON:  None Available. FINDINGS: No recent fracture or dislocation is  seen. Subcortical cyst is seen in lunate. Bony spurs are seen in first carpometacarpal and first metacarpophalangeal joints. Arterial calcifications are seen in soft tissues. IMPRESSION: No recent fracture or dislocation is seen in left wrist. Electronically Signed   By: Ernie Avena M.D.   On: 08/10/2022 15:49   DG Chest Portable 1 View  Result Date: 08/10/2022 CLINICAL DATA:  Trauma, fall EXAM: PORTABLE CHEST 1 VIEW COMPARISON:  02/23/2019 FINDINGS: Cardiac size is in upper limits of normal. There are no signs of pulmonary edema or focal pulmonary consolidation. Patchy infiltrate in right lower lung field seen in the earlier CT could not be clearly identified on the radiograph. There is no pleural effusion or pneumothorax. Air soft tissue interface seen in the lateral aspect of left lower lung field may be due to skin fold. IMPRESSION: There are no signs of pulmonary edema or focal pulmonary consolidation. Electronically Signed   By: Ernie Avena M.D.   On: 08/10/2022 15:45   CT CHEST ABDOMEN PELVIS WO CONTRAST  Result Date: 08/10/2022 CLINICAL DATA:  Trauma, fall EXAM: CT CHEST, ABDOMEN AND PELVIS WITHOUT CONTRAST  TECHNIQUE: Multidetector CT imaging of the chest, abdomen and pelvis was performed following the standard protocol without IV contrast. RADIATION DOSE REDUCTION: This exam was performed according to the departmental dose-optimization program which includes automated exposure control, adjustment of the mA and/or kV according to patient size and/or use of iterative reconstruction technique. COMPARISON:  CT pelvis done on 02/12/2021, CT abdomen and pelvis done on 10/17/2012 FINDINGS: CT CHEST FINDINGS Cardiovascular: Coronary artery calcifications are seen. Scattered calcifications are seen in thoracic aorta and its major branches. Mediastinum/Nodes: No acute findings are seen. Lungs/Pleura: There are patchy alveolar infiltrates in posteromedial right lower lung field. Small linear densities are seen in left lower lung field. There is no pleural effusion or pneumothorax. Musculoskeletal: No acute findings are seen in bony structures in the thorax. CT ABDOMEN PELVIS FINDINGS Hepatobiliary: Surgical clips are seen in gallbladder fossa. There is no dilation of bile ducts. Pancreas: No focal abnormalities are seen. Spleen: Unremarkable. Adrenals/Urinary Tract: Adrenals are unremarkable. There is no hydronephrosis. There are scattered calcifications in renal artery branches. No definite renal or ureteral stones are seen. Urinary bladder is unremarkable. Stomach/Bowel: Stomach is unremarkable. Small bowel loops are not dilated. Appendix is not dilated. There is no significant wall thickening in colon. Multiple diverticula are seen in colon without signs of focal acute diverticulitis. Vascular/Lymphatic: Arterial calcifications are seen in aorta and its major branches. There is a 3 x 2.3 cm aneurysm in the right common iliac artery. There is 2.3 x 2.2 cm aneurysm in the left common iliac artery. Reproductive: Prostate is enlarged projecting into the base of the bladder. There are scattered calcifications in prostate. Other:  There is no ascites or pneumoperitoneum. Small umbilical hernia containing fat is seen. Small bilateral inguinal hernias containing fat are noted. Musculoskeletal: There is comminuted intertrochanteric fracture in proximal left femur. There is no dislocation. Degenerative changes are noted in both hips. There is large Schmorl's node in the upper endplate of body of L4 vertebra. Degenerative changes are noted in thoracic and lumbar spine. IMPRESSION: Recent comminuted intertrochanteric fracture is seen in proximal left femur. No other definite recent fractures are seen. There is large Schmorl's node in the upper endplate of body of L4 vertebra. Degenerative changes are noted in thoracic and lumbar spine with bony spurs. Patchy alveolar infiltrate is seen in right lower lobe suggesting atelectasis/pneumonia. Small linear densities in left  lower lung field may suggest scarring or subsegmental atelectasis. There is no evidence of hematoma in mediastinum and retroperitoneal region. There is no demonstrable laceration in solid organs. There is no bowel wall thickening. Coronary artery disease. Aortic atherosclerosis. Aneurysmal dilation is seen in both common iliac arteries, larger on the right side. Diverticulosis of colon without signs of focal diverticulitis. Enlarged prostate. Electronically Signed   By: Ernie Avena M.D.   On: 08/10/2022 15:23   CT Head Wo Contrast  Result Date: 08/10/2022 CLINICAL DATA:  Head trauma, moderate-severe; Neck trauma (Age >= 65y). Mechanical fall. EXAM: CT HEAD WITHOUT CONTRAST CT CERVICAL SPINE WITHOUT CONTRAST TECHNIQUE: Multidetector CT imaging of the head and cervical spine was performed following the standard protocol without intravenous contrast. Multiplanar CT image reconstructions of the cervical spine were also generated. RADIATION DOSE REDUCTION: This exam was performed according to the departmental dose-optimization program which includes automated exposure control,  adjustment of the mA and/or kV according to patient size and/or use of iterative reconstruction technique. COMPARISON:  Head CT 10/17/2017. FINDINGS: CT HEAD FINDINGS Brain: No acute hemorrhage. Unchanged moderate chronic small-vessel disease. Cortical gray-white differentiation is otherwise preserved. Prominence of the ventricles and sulci within expected range for age. No hydrocephalus or extra-axial collection. No mass effect or midline shift. Vascular: No hyperdense vessel or unexpected calcification. Skull: No calvarial fracture or suspicious bone lesion. Skull base is unremarkable. Sinuses/Orbits: Unremarkable. Other: None. CT CERVICAL SPINE FINDINGS Alignment: 3 mm degenerative anterolisthesis of C7 on T1. No traumatic malalignment. Skull base and vertebrae: No acute fracture. Normal craniocervical junction. No suspicious bone lesions. Soft tissues and spinal canal: No prevertebral fluid or swelling. No visible canal hematoma. Disc levels: Multilevel lower cervical spondylosis and ossification of the posterior longitudinal ligament, worst at C4-5, where there is at least moderate spinal canal stenosis. Upper chest: Unremarkable. Other: None. IMPRESSION: 1. No acute intracranial abnormality. 2. No acute fracture or traumatic malalignment of the cervical spine. 3. Multilevel lower cervical spondylosis and ossification of the posterior longitudinal ligament, worst at C4-5, where there is at least moderate spinal canal stenosis. Electronically Signed   By: Orvan Falconer M.D.   On: 08/10/2022 15:05   CT Cervical Spine Wo Contrast  Result Date: 08/10/2022 CLINICAL DATA:  Head trauma, moderate-severe; Neck trauma (Age >= 65y). Mechanical fall. EXAM: CT HEAD WITHOUT CONTRAST CT CERVICAL SPINE WITHOUT CONTRAST TECHNIQUE: Multidetector CT imaging of the head and cervical spine was performed following the standard protocol without intravenous contrast. Multiplanar CT image reconstructions of the cervical spine were  also generated. RADIATION DOSE REDUCTION: This exam was performed according to the departmental dose-optimization program which includes automated exposure control, adjustment of the mA and/or kV according to patient size and/or use of iterative reconstruction technique. COMPARISON:  Head CT 10/17/2017. FINDINGS: CT HEAD FINDINGS Brain: No acute hemorrhage. Unchanged moderate chronic small-vessel disease. Cortical gray-white differentiation is otherwise preserved. Prominence of the ventricles and sulci within expected range for age. No hydrocephalus or extra-axial collection. No mass effect or midline shift. Vascular: No hyperdense vessel or unexpected calcification. Skull: No calvarial fracture or suspicious bone lesion. Skull base is unremarkable. Sinuses/Orbits: Unremarkable. Other: None. CT CERVICAL SPINE FINDINGS Alignment: 3 mm degenerative anterolisthesis of C7 on T1. No traumatic malalignment. Skull base and vertebrae: No acute fracture. Normal craniocervical junction. No suspicious bone lesions. Soft tissues and spinal canal: No prevertebral fluid or swelling. No visible canal hematoma. Disc levels: Multilevel lower cervical spondylosis and ossification of the posterior longitudinal ligament, worst at  C4-5, where there is at least moderate spinal canal stenosis. Upper chest: Unremarkable. Other: None. IMPRESSION: 1. No acute intracranial abnormality. 2. No acute fracture or traumatic malalignment of the cervical spine. 3. Multilevel lower cervical spondylosis and ossification of the posterior longitudinal ligament, worst at C4-5, where there is at least moderate spinal canal stenosis. Electronically Signed   By: Orvan Falconer M.D.   On: 08/10/2022 15:05        Scheduled Meds:  allopurinol  100 mg Oral Daily   amLODipine  5 mg Oral Daily   aspirin EC  81 mg Oral Daily   atorvastatin  80 mg Oral q1800   carvedilol  3.125 mg Oral BID WC   enoxaparin (LOVENOX) injection  30 mg Subcutaneous Q24H    feeding supplement  237 mL Oral BID BM   fentaNYL (SUBLIMAZE) injection  50 mcg Intravenous Once   finasteride  5 mg Oral Daily   gabapentin  300 mg Oral QHS   guaiFENesin  400 mg Oral QHS   insulin aspart  0-9 Units Subcutaneous TID WC   isosorbide mononitrate  120 mg Oral Daily   pantoprazole  40 mg Oral Daily   primidone  50 mg Oral QHS   sucralfate  1 g Oral QID   Continuous Infusions:     LOS: 2 days    Time spent: 35 minutes.    Berton Mount, MD  [facility] Pager #: [phone] 7PM-7AM contact night coverage as above

## 2022-08-12 NOTE — Progress Notes (Signed)
Inpatient Rehab Admissions Coordinator Note:   Per PT patient was screened for CIR candidacy by Janelle Spellman Luvenia Starch, CCC-SLP. At this time, pt appears to be a potential candidate for CIR. I will place an order for rehab consult for full assessment, per our protocol.  Please contact me any with questions.Wolfgang Phoenix, MS, CCC-SLP Admissions Coordinator [phone] 08/12/22 5:32 PM

## 2022-08-13 LAB — GLUCOSE, CAPILLARY
Glucose-Capillary: 134 mg/dL — ABNORMAL HIGH (ref 70–99)
Glucose-Capillary: 164 mg/dL — ABNORMAL HIGH (ref 70–99)
Glucose-Capillary: 157 mg/dL — ABNORMAL HIGH (ref 70–99)

## 2022-08-13 MED ORDER — OXYCODONE HCL 5 MG PO TABS
5.0000 mg | ORAL_TABLET | Freq: Four times a day (QID) | ORAL | Status: DC | PRN
  Administered 2022-08-15 – 2022-08-16 (×3): 10 mg via ORAL
  Filled 2022-08-13 (×3): qty 2

## 2022-08-13 MED ORDER — GABAPENTIN 300 MG PO CAPS
300.0000 mg | ORAL_CAPSULE | Freq: Two times a day (BID) | ORAL | Status: DC
  Administered 2022-08-13 – 2022-08-16 (×6): 300 mg via ORAL
  Filled 2022-08-13 (×6): qty 1

## 2022-08-13 MED ORDER — HYDROMORPHONE HCL 1 MG/ML IJ SOLN
0.5000 mg | INTRAMUSCULAR | Status: DC | PRN
  Administered 2022-08-13 – 2022-08-15 (×2): 0.5 mg via INTRAVENOUS
  Filled 2022-08-13 (×3): qty 0.5

## 2022-08-13 NOTE — Progress Notes (Signed)
Subjective: Patient reports pain as marked. Really only better with the IV pain medicine. Complaining of burning pain from L buttock to foot. Denies h/o back issues but appears he is on Gabapentin daily for neuropathy. Tolerating diet.  Urinating.   No CP, SOB.  Has mobilized OOB with PT. Says he's freezing so covered in blankets but room not cold to me and he appears sweaty.   Objective:   VITALS:   Vitals:   08/12/22 0730 08/12/22 1932 08/13/22 0454 08/13/22 0851  BP: (!) 100/56 (!) 98/53 101/66 (!) 141/49  Pulse: 69 85 71 82  Resp: 18 18 17 18   Temp: 98.6 F (37 C) 98.4 F (36.9 C) 98.6 F (37 C) 98 F (36.7 C)  TempSrc: Oral Oral Oral Oral  SpO2: 99% 94% 97% 95%  Weight:      Height:          Latest Ref Rng & Units 08/12/2022    2:29 AM 08/10/2022    2:41 PM 08/10/2022    1:40 PM  CBC  WBC 4.0 - 10.5 K/uL 9.4   7.8   Hemoglobin 13.0 - 17.0 g/dL 16.1  09.6  04.5   Hematocrit 39.0 - 52.0 % 29.8  45.0  45.0   Platelets 150 - 400 K/uL 197   244       Latest Ref Rng & Units 08/12/2022    2:29 AM 08/10/2022    2:41 PM 08/10/2022    1:40 PM  BMP  Glucose 70 - 99 mg/dL 409  811  914   BUN 8 - 23 mg/dL 56  51  54   Creatinine 0.61 - 1.24 mg/dL 7.82  9.56  2.13   Sodium 135 - 145 mmol/L 134  140  137   Potassium 3.5 - 5.1 mmol/L 4.9  4.5  4.5   Chloride 98 - 111 mmol/L 101  106  105   CO2 22 - 32 mmol/L 24   22   Calcium 8.9 - 10.3 mg/dL 8.4   9.3    Intake/Output      05/04 0701 05/05 0700 05/05 0701 05/06 0700   P.O. 240    I.V. (mL/kg)     IV Piggyback     Total Intake(mL/kg) 240 (2.8)    Urine (mL/kg/hr) 350 (0.2)    Blood     Total Output 350    Net -110            Physical Exam: General: NAD.  Laying in bed, shaking, covered in blankets Resp: No increased wob Cardio: regular rate and rhythm ABD soft Neurologically intact MSK Neurovascularly intact Sensation intact distally Intact pulses distally Dorsiflexion/Plantar flexion intact Minimal if any L  hip or knee ROM tolerated Incision: dressings C/D/I Ecchymosis to L lateral thigh   Assessment: 2 Days Post-Op  S/P Procedure(s) (LRB): INTRAMEDULLARY (IM) NAIL INTERTROCHANTERIC (Left) by Dr. Jewel Baize. [name] on 08/11/22  Principal Problem:   Hip fracture [facility]) Active Problems:   Left displaced femoral neck fracture (HCC)   CAD S/P percutaneous coronary angioplasty   Fall   Plan: Need to get better pain control May need to further image his L spine and pelvis if continues to have uncontrolled pain and inability to AROM L leg   Advance diet Up with therapy Incentive Spirometry Elevate and Apply ice  Weightbearing: WBAT LLE Insicional and dressing care: Reinforce dressings as needed Orthopedic device(s): None Showering: Keep dressing dry VTE prophylaxis:  Lovenox 30mg  daily  ,  SCDs, ambulation Pain control: Going to add Oxycodone and increase Gabapentin  Follow - up plan:  in office with Dr. Blanchie Dessert   Dispo:  PT/OT recommending CIR     Jenne Pane, PA-C Office [phone] 08/13/2022, 12:36 PM

## 2022-08-13 NOTE — Progress Notes (Signed)
Inpatient Rehab Admissions:  Inpatient Rehab Consult received.  I met with patient at the bedside for rehabilitation assessment and to discuss goals and expectations of an inpatient rehab admission.  Discussed average length of stay, insurance authorization requirement, discharge home after completion of CIR. Pt acknowledged understanding. Pt interested in pursuing CIR. Pt gave permission to contact wife Tyler Aas. Spoke with Tyler Aas on the telephone. She also acknowledged understanding of CIR goals and expectations. She is supportive of pt pursuing CIR. She confirmed that she along with their son Casimiro Needle and daughter-in-law Dois Davenport will be able to provide 24/7 support for pt after discharge. Will continue to follow.  Signed: Wolfgang Phoenix, MS, CCC-SLP Admissions Coordinator [phone]

## 2022-08-13 NOTE — Progress Notes (Signed)
PROGRESS NOTE    Marco Acevedo  UJW:[id number] DOB: 13-Nov-1934 DOA: 08/10/2022 PCP: Daisy Floro, MD  Outpatient Specialists:     Brief Narrative:  Patient is an 88 year old male with history of coronary artery disease status post stenting x 2, chronic heart failure with preserved ejection fraction, chronic kidney disease stage IIIb, type 2 diabetes mellitus, COPD and GERD.  Patient was admitted with left hip fracture following a fall.  Cardiology team's input is appreciated.  Patient has been cleared for surgery.  Patient is awaiting surgery this afternoon.  Echocardiogram revealed mild to moderate aortic stenosis and regurgitation, normal ejection fraction and grade 1 diastolic dysfunction.  08/11/2022: Patient seen.  No new complaints.  No chest pain, no shortness of breath.  Patient was fairly active prior to the fall.  Patient's son was updated.  For surgery this afternoon.  08/12/2022: Patient underwent intramedullary nailing yesterday, 08/11/2022.  Pain is not adequately controlled.  Pain seems to be limiting ability to perform physical therapy.  Will use lidocaine patch for the pain involving the left buttock area.  08/13/2022: Patient continues to report pain around the left ischial tuberosity area.  Reports that the pain is significant.  Will pursue pelvic CT.  The goal was to remain to optimize pain control.  PT OT input is appreciated.  Suspect likely SNF placement.   Assessment & Plan:   Principal Problem:   Hip fracture (HCC) Active Problems:   Left displaced femoral neck fracture (HCC)   CAD S/P percutaneous coronary angioplasty   Fall   Left intertrochanteric femoral fracture -S/p intertrochanteric nailing  -Optimize pain control.   -Postop management will be as per orthopedic team.     History of CAD -Stable.  No chest pain. -Continue aspirin and statin and beta-blocker   CKD stage IIIb -Euvolemic, creatinine at baseline   IIDM -SSI for now   COPD -Stable    GERD -Poorly controlled, continue PPI and sucralfate  Left ischial tuberosity pain: -CT pelvis. -Continue lidocaine patch to the area. -Seems chronic. -Continue to optimize pain control.   DVT prophylaxis: Lovenox. Code Status: Full code. Family Communication: Son. Disposition Plan: This will depend on hospital course.   Consultants:  Cardiology Orthopedics.  Procedures:  For ORIF of left hip fracture this afternoon  Antimicrobials:  None   Subjective: Pain around the left ischial tuberosity persists.    Objective: Vitals:   08/12/22 0730 08/12/22 1932 08/13/22 0454 08/13/22 0851  BP: (!) 100/56 (!) 98/53 101/66 (!) 141/49  Pulse: 69 85 71 82  Resp: 18 18 17 18   Temp: 98.6 F (37 C) 98.4 F (36.9 C) 98.6 F (37 C) 98 F (36.7 C)  TempSrc: Oral Oral Oral Oral  SpO2: 99% 94% 97% 95%  Weight:      Height:        Intake/Output Summary (Last 24 hours) at 08/13/2022 1637 Last data filed at 08/13/2022 0100 Gross per 24 hour  Intake --  Output 200 ml  Net -200 ml    Filed Weights   08/10/22 1342 08/11/22 1151  Weight: 86.6 kg 87 kg    Examination:  General exam: Appears calm and comfortable  Respiratory system: Clear to auscultation.  Cardiovascular system: S1 & S2 heard, systolic murmur.   Gastrointestinal system: Abdomen is nondistended, soft and nontender. No organomegaly or masses felt. Normal bowel sounds heard. Central nervous system: Alert and oriented.  Moves all extremities.   Extremities: No leg edema.  Data Reviewed: I have  personally reviewed following labs and imaging studies  CBC: Recent Labs  Lab 08/10/22 1340 08/10/22 1441 08/12/22 0229  WBC 7.8  --  9.4  NEUTROABS 5.7  --  7.3  HGB 15.0 15.3 10.1*  HCT 45.0 45.0 29.8*  MCV 87.9  --  88.4  PLT 244  --  197    Basic Metabolic Panel: Recent Labs  Lab 08/10/22 1340 08/10/22 1441 08/12/22 0229  NA 137 140 134*  K 4.5 4.5 4.9  CL 105 106 101  CO2 22  --  24  GLUCOSE 113*  110* 181*  BUN 54* 51* 56*  CREATININE 2.58* 2.90* 2.79*  CALCIUM 9.3  --  8.4*  MG  --   --  1.6*  PHOS  --   --  4.6    GFR: Estimated Creatinine Clearance: 20.8 mL/min (A) (by C-G formula based on SCr of 2.79 mg/dL (H)). Liver Function Tests: Recent Labs  Lab 08/12/22 0229  ALBUMIN 3.2*    No results for input(s): "LIPASE", "AMYLASE" in the last 168 hours. No results for input(s): "AMMONIA" in the last 168 hours. Coagulation Profile: Recent Labs  Lab 08/10/22 1340  INR 1.0    Cardiac Enzymes: No results for input(s): "CKTOTAL", "CKMB", "CKMBINDEX", "TROPONINI" in the last 168 hours. BNP (last 3 results) No results for input(s): "PROBNP" in the last 8760 hours. HbA1C: Recent Labs    08/10/22 1807  HGBA1C 6.5*    CBG: Recent Labs  Lab 08/12/22 1154 08/12/22 1709 08/12/22 2120 08/13/22 0726 08/13/22 1116  GLUCAP 134* 132* 163* 134* 164*    Lipid Profile: No results for input(s): "CHOL", "HDL", "LDLCALC", "TRIG", "CHOLHDL", "LDLDIRECT" in the last 72 hours. Thyroid Function Tests: No results for input(s): "TSH", "T4TOTAL", "FREET4", "T3FREE", "THYROIDAB" in the last 72 hours. Anemia Panel: No results for input(s): "VITAMINB12", "FOLATE", "FERRITIN", "TIBC", "IRON", "RETICCTPCT" in the last 72 hours. Urine analysis:    Component Value Date/Time   COLORURINE YELLOW (A) 10/19/2017 1008   APPEARANCEUR CLEAR (A) 10/19/2017 1008   LABSPEC 1.025 10/19/2017 1008   PHURINE 5.0 10/19/2017 1008   GLUCOSEU NEGATIVE 10/19/2017 1008   HGBUR NEGATIVE 10/19/2017 1008   BILIRUBINUR NEGATIVE 10/19/2017 1008   KETONESUR NEGATIVE 10/19/2017 1008   PROTEINUR 30 (A) 10/19/2017 1008   NITRITE NEGATIVE 10/19/2017 1008   LEUKOCYTESUR NEGATIVE 10/19/2017 1008   Sepsis Labs: @LABRCNTIP (procalcitonin:4,lacticidven:4)  ) Recent Results (from the past 240 hour(s))  Surgical pcr screen     Status: None   Collection Time: 08/11/22  6:51 AM   Specimen: Nasal Mucosa; Nasal  Swab  Result Value Ref Range Status   MRSA, PCR NEGATIVE NEGATIVE Final   Staphylococcus aureus NEGATIVE NEGATIVE Final    Comment: (NOTE) The Xpert SA Assay (FDA approved for NASAL specimens in patients 17 years of age and older), is one component of a comprehensive surveillance program. It is not intended to diagnose infection nor to guide or monitor treatment. Performed at [facility], [address], [place], [place] [address]          Radiology Studies: DG HIP Lucienne Capers OR W/O PELVIS 2-3 VIEWS LEFT  Result Date: 08/11/2022 CLINICAL DATA:  [id number] Post-operative state 252351 EXAM: DG HIP (WITH OR WITHOUT PELVIS) 2-3V LEFT COMPARISON:  08/10/2022 FINDINGS: Postsurgical changes of left proximal femur intramedullary nail for an intertrochanteric fracture. Improved fracture alignment. No evidence of immediate hardware complication. IMPRESSION: Postsurgical changes of left proximal femur ORIF. No evidence of immediate hardware complication. Electronically Signed  By: Caprice Renshaw M.D.   On: 08/11/2022 17:11        Scheduled Meds:  allopurinol  100 mg Oral Daily   amLODipine  5 mg Oral Daily   aspirin EC  81 mg Oral Daily   atorvastatin  80 mg Oral q1800   carvedilol  3.125 mg Oral BID WC   enoxaparin (LOVENOX) injection  30 mg Subcutaneous Q24H   feeding supplement  237 mL Oral BID BM   fentaNYL (SUBLIMAZE) injection  50 mcg Intravenous Once   finasteride  5 mg Oral Daily   gabapentin  300 mg Oral BID   guaiFENesin  400 mg Oral QHS   insulin aspart  0-9 Units Subcutaneous TID WC   isosorbide mononitrate  120 mg Oral Daily   lidocaine  1 patch Transdermal Q24H   pantoprazole  40 mg Oral Daily   primidone  50 mg Oral QHS   sucralfate  1 g Oral QID   Continuous Infusions:     LOS: 3 days    Time spent: 35 minutes.    Berton Mount, MD  [facility] Pager #: [phone] 7PM-7AM contact night coverage as above

## 2022-08-13 NOTE — PMR Pre-admission (Signed)
PMR Admission Coordinator Pre-Admission Assessment  Patient: Marco Acevedo is an 88 y.o., male MRN: [id number] DOB: 1934-04-18 Height: 5\' 8"  (172.7 cm) Weight: 87 kg  Insurance Information HMO: ***    PPO: ***     PCP:      IPA:      80/20:      OTHER:  PRIMARY: [facility] [employer]      Policy#: [id number]      Subscriber: patient CM Name: ***      Phone#: ***     Fax#: *** Pre-Cert#: ***      Employer: *** Benefits:  Phone #: ***     Name: *** Dolores Hoose. Date: ***     Deduct: ***      Out of Pocket Max: ***      Life Max: *** CIR: ***      SNF: *** Outpatient: ***     Co-Pay: *** Home Health: ***      Co-Pay: *** DME: ***     Co-Pay: *** Providers: in-network SECONDARY: [employer] Advantage      Policy#: [id number]     Phone#: ***  Financial Counselor:       Phone#:   The "Data Collection Information Summary" for patients in Inpatient Rehabilitation Facilities with attached "Privacy Act Statement-Health Care Records" was provided and verbally reviewed with: {CHL IP Patient Family HY:[id number]}  Emergency Contact Information Contact Information     Name Relation Home Work Mobile   Leifheit,Michael Son [phone]  [phone]   Lanza,Doris Spouse [phone]         Current Medical History  Patient Admitting Diagnosis: L hip fracture History of Present Illness: Pt is an 88 year old male with medical hx significant for: HTN, diabetes, CAD with stenting x2, chronic HFpEF, CKD stage IIIb, COPD, . Pt presented to [facility] on 08/10/[facility] after fall. Pt reported left hip and buttock pain. Noted skin tear to right elbow and left wrist. X-ray revealed left intertrochanteric hip fracture. CT head and C spine negative for acute abnormalities. Orthopedics consulted. Cardiology consulted for preop risk stratification. Echo showed moderate aortic stenosis, Normal LV, calcified lesions. Pt underwent IM Nail on 08/11/22 by Dr. Blanchie Dessert. Therapy evaluations completed and CIR  recommended d/t pt's deficits in functional mobility, pain management, and inability to complete ADLs independently.     Patient's medical record [facility] has been reviewed by the rehabilitation admission coordinator and physician.  Past Medical History  Past Medical History:  Diagnosis Date   Anemia    low iron   Aortic stenosis    Arthritis    BPH (benign prostatic hyperplasia)    CAD (coronary artery disease)    Chronic diastolic CHF (congestive heart failure) (HCC)    CKD (chronic kidney disease), stage IV (HCC)    Claustrophobia    Complication of anesthesia    has to have head elevated to keep from strangling on post nasal drip   COPD (chronic obstructive pulmonary disease) (HCC)    Diabetes mellitus type II    type 2   GERD (gastroesophageal reflux disease)    Gout    Granuloma annulare    History of hiatal hernia    History of kidney stones    Litthotrispy   Hyperlipidemia    Hypertension    Neuropathy    Obesity    OSA (obstructive sleep apnea)    Stroke (HCC) 10/2017   Weakness right arm and leg    Has  the patient had major surgery during 100 days prior to admission? Yes  Family History   family history includes Aneurysm in his mother; Aortic aneurysm in his father; COPD in his sister; Cancer in his brother; Other in his brother, sister, and sister.  Current Medications  Current Facility-Administered Medications:    albuterol (PROVENTIL) (2.5 MG/3ML) 0.083% nebulizer solution 2.5 mg, 2.5 mg, Nebulization, Q6H PRN, Joen Laura, MD   allopurinol (ZYLOPRIM) tablet 100 mg, 100 mg, Oral, Daily, Joen Laura, MD, 100 mg at 08/13/22 0846   amLODipine (NORVASC) tablet 5 mg, 5 mg, Oral, Daily, Joen Laura, MD, 5 mg at 08/13/22 1610   aspirin EC tablet 81 mg, 81 mg, Oral, Daily, Joen Laura, MD, 81 mg at 08/13/22 0846   atorvastatin (LIPITOR) tablet 80 mg, 80 mg, Oral, q1800, Joen Laura, MD, 80 mg at  08/12/22 1834   bisacodyl (DULCOLAX) EC tablet 5 mg, 5 mg, Oral, Daily PRN, Joen Laura, MD   carvedilol (COREG) tablet 3.125 mg, 3.125 mg, Oral, BID WC, Joen Laura, MD, 3.125 mg at 08/13/22 0846   enoxaparin (LOVENOX) injection 30 mg, 30 mg, Subcutaneous, Q24H, Joen Laura, MD, 30 mg at 08/13/22 0848   feeding supplement (ENSURE ENLIVE / ENSURE PLUS) liquid 237 mL, 237 mL, Oral, BID BM, Joen Laura, MD, 237 mL at 08/13/22 0848   fentaNYL (SUBLIMAZE) injection 50 mcg, 50 mcg, Intravenous, Once, Joen Laura, MD   finasteride (PROSCAR) tablet 5 mg, 5 mg, Oral, Daily, Joen Laura, MD, 5 mg at 08/13/22 0846   gabapentin (NEURONTIN) capsule 300 mg, 300 mg, Oral, BID, Gawne, Meghan M, PA-C   guaiFENesin tablet 400 mg, 400 mg, Oral, QHS, Joen Laura, MD, 400 mg at 08/12/22 2116   HYDROcodone-acetaminophen (NORCO/VICODIN) 5-325 MG per tablet 1-2 tablet, 1-2 tablet, Oral, Q6H PRN, Joen Laura, MD, 1 tablet at 08/12/22 9604   HYDROmorphone (DILAUDID) injection 0.5 mg, 0.5 mg, Intravenous, Q2H PRN, Gawne, Meghan M, PA-C   insulin aspart (novoLOG) injection 0-9 Units, 0-9 Units, Subcutaneous, TID WC, Joen Laura, MD, 2 Units at 08/13/22 1236   isosorbide mononitrate (IMDUR) 24 hr tablet 120 mg, 120 mg, Oral, Daily, Joen Laura, MD, 120 mg at 08/13/22 0846   lidocaine (LIDODERM) 5 % 1 patch, 1 patch, Transdermal, Q24H, Berton Mount I, MD, 1 patch at 08/12/22 1835   melatonin tablet 5 mg, 5 mg, Oral, QHS PRN, Joen Laura, MD, 5 mg at 08/12/22 0057   oxyCODONE (Oxy IR/ROXICODONE) immediate release tablet 5-10 mg, 5-10 mg, Oral, Q6H PRN, Gawne, Meghan M, PA-C   pantoprazole (PROTONIX) EC tablet 40 mg, 40 mg, Oral, Daily, Joen Laura, MD, 40 mg at 08/13/22 0846   primidone (MYSOLINE) tablet 50 mg, 50 mg, Oral, QHS, Joen Laura, MD, 50 mg at 08/12/22 2116   senna-docusate (Senokot-S) tablet 1  tablet, 1 tablet, Oral, QHS PRN, Joen Laura, MD   sucralfate (CARAFATE) 1 GM/10ML suspension 1 g, 1 g, Oral, QID, Joen Laura, MD, 1 g at 08/12/22 2116  Patients Current Diet:  Diet Order             Diet regular Room service appropriate? Yes; Fluid consistency: Thin  Diet effective now                   Precautions / Restrictions Precautions Precautions: Fall Precaution Comments: WBAT, no precautions Restrictions Weight Bearing Restrictions: Yes LLE Weight Bearing:  Weight bearing as tolerated   Has the patient had 2 or more falls or a fall with injury in the past year? Yes  Prior Activity Level Community (5-7x/wk): drives, very active  Prior Functional Level Self Care: Did the patient need help bathing, dressing, using the toilet or eating? Independent  Indoor Mobility: Did the patient need assistance with walking from room to room (with or without device)? Independent  Stairs: Did the patient need assistance with internal or external stairs (with or without device)? Independent  Functional Cognition: Did the patient need help planning regular tasks such as shopping or remembering to take medications? Independent  Patient Information Are you of Hispanic, Latino/a,or Spanish origin?: A. No, not of Hispanic, Latino/a, or Spanish origin What is your race?: A. White Do you need or want an interpreter to communicate with a doctor or health care staff?: 0. No  Patient's Response To:  Health Literacy and Transportation Is the patient able to respond to health literacy and transportation needs?: Yes Health Literacy - How often do you need to have someone help you when you read instructions, pamphlets, or other written material from your doctor or pharmacy?: Never In the past 12 months, has lack of transportation kept you from medical appointments or from getting medications?: No In the past 12 months, has lack of transportation kept you from meetings, work,  or from getting things needed for daily living?: No  Journalist, newspaper / Equipment Home Assistive Devices/Equipment: None Home Equipment: Agricultural consultant (2 wheels), [employer] - quad, Information systems manager - built in, [employer] - tub/shower  Prior Device Use: Indicate devices/aids used by the patient prior to current illness, exacerbation or injury? None of the above  Current Functional Level Cognition  Overall Cognitive Status: Within Functional Limits for tasks assessed Orientation Level: Oriented X4 General Comments: initially very anxious about trying to move, reattempted after pain meds    Extremity Assessment (includes Sensation/Coordination)  Upper Extremity Assessment: Overall WFL for tasks assessed  Lower Extremity Assessment: LLE deficits/detail LLE Deficits / Details: generally weak on hip and cannot move without support x with gait LLE: Unable to fully assess due to pain LLE Coordination: decreased gross motor    ADLs       Mobility  Overal bed mobility: Needs Assistance Bed Mobility: Supine to Sit, Sit to Supine Supine to sit: Mod assist General bed mobility comments: mod to support LLE and assist to lift trunk    Transfers  Overall transfer level: Needs assistance Equipment used: Rolling walker (2 wheels) Transfers: Sit to/from Stand Sit to Stand: Mod assist General transfer comment: mod assist from bed with elevation    Ambulation / Gait / Stairs / Wheelchair Mobility  Ambulation/Gait Ambulation/Gait assistance: Editor, commissioning (Feet): 30 Feet (8+22) Assistive device: Rolling walker (2 wheels), 1 person hand held assist Gait Pattern/deviations: Step-through pattern, Decreased stride length, Decreased weight shift to left, Wide base of support General Gait Details: pt took extra time wiht RW and could both sidestep with help and walk around the bed Gait velocity: reduced Gait velocity interpretation: <1.31 ft/sec, indicative of household ambulator Pre-gait  activities: standing balance and control of posture and walker    Posture / Balance Balance Overall balance assessment: Needs assistance, History of Falls Sitting-balance support: Feet supported Sitting balance-Leahy Scale: Good Standing balance support: Bilateral upper extremity supported, During functional activity Standing balance-Leahy Scale: Poor    Special needs/care consideration Oxygen 2L nasal cannula, Skin Ecchymosis: arm/right; Surgical incision: hip/left, and  Diabetic management Novolog 0-9 units 3x daily with meals   Previous Home Environment (from acute therapy documentation) Living Arrangements: Spouse/significant other  Lives With: Spouse Available Help at Discharge: Friend(s), Available 24 hours/day Type of Home: Other(Comment) (condo) Home Layout: One level Home Access: Stairs to enter Entrance Stairs-Rails: None Entrance Stairs-Number of Steps: 1 Bathroom Shower/Tub: Health visitor: Standard Bathroom Accessibility: Yes How Accessible: Accessible via walker Home Care Services: No  Discharge Living Setting Plans for Discharge Living Setting: Patient's home Type of Home at Discharge: Other (Comment) (condo) Discharge Home Layout: One level Discharge Home Access: Stairs to enter Entrance Stairs-Rails: None Entrance Stairs-Number of Steps: 1 Discharge Bathroom Shower/Tub: Walk-in shower Discharge Bathroom Toilet: Standard Discharge Bathroom Accessibility: Yes How Accessible: Accessible via walker Does the patient have any problems obtaining your medications?: No  Social/Family/Support Systems Anticipated Caregiver: Drazen Eckert (wife), Casimiro Needle and Dois Davenport (son and daughter-in-law) Anticipated Caregiver's Contact Information: Doris: [phone] Caregiver Availability: 24/7 Discharge Plan Discussed with Primary Caregiver: Yes Is Caregiver In Agreement with Plan?: Yes Does Caregiver/Family have Issues with Lodging/Transportation while Pt is in  Rehab?: No  Goals Patient/Family Goal for Rehab: *** Expected length of stay: *** Pt/Family Agrees to Admission and willing to participate: Yes Program Orientation Provided & Reviewed with Pt/Caregiver Including Roles  & Responsibilities: Yes  Decrease burden of Care through IP rehab admission: NA  Possible need for SNF placement upon discharge: Not anticipated  Patient Condition: I have reviewed medical records from [facility], spoken with CSW, and patient and spouse. I met with patient at the bedside and discussed via phone for inpatient rehabilitation assessment.  Patient will benefit from ongoing PT and OT, can actively participate in 3 hours of therapy a day 5 days of the week, and can make measurable gains during the admission.  Patient will also benefit from the coordinated team approach during an Inpatient Acute Rehabilitation admission.  The patient will receive intensive therapy as well as Rehabilitation physician, nursing, social worker, and care management interventions.  Due to safety, skin/wound care, disease management, medication administration, pain management, and patient education the patient requires 24 hour a day rehabilitation nursing.  The patient is currently *** with mobility and basic ADLs.  Discharge setting and therapy post discharge at home with home health is anticipated.  Patient has agreed to participate in the Acute Inpatient Rehabilitation Program and will admit {Time; today/tomorrow:10263}.  Preadmission Screen Completed By:  Domingo Pulse, 08/13/2022 2:43 PM ______________________________________________________________________   Discussed status with Dr. [name] on *** at *** and received approval for admission today.  Admission Coordinator:  Domingo Pulse, CCC-SLP, time ***/Date ***   Assessment/Plan: Diagnosis: Does the need for close, 24 hr/day Medical supervision in concert with the patient's rehab needs make it unreasonable for this  patient to be served in a less intensive setting? {yes_no_potentially:3041433} Co-Morbidities requiring supervision/potential complications: *** Due to {due UJ:8119147}, does the patient require 24 hr/day rehab nursing? {yes_no_potentially:3041433} Does the patient require coordinated care of a physician, rehab nurse, PT, OT, and SLP to address physical and functional deficits in the context of the above medical diagnosis(es)? {yes_no_potentially:3041433} Addressing deficits in the following areas: {deficits:3041436} Can the patient actively participate in an intensive therapy program of at least 3 hrs of therapy 5 days a week? {yes_no_potentially:3041433} The potential for patient to make measurable gains while on inpatient rehab is {potential:3041437} Anticipated functional outcomes upon discharge from inpatient rehab: {functional outcomes:304600100} PT, {functional outcomes:304600100} OT, {functional outcomes:304600100} SLP Estimated  rehab length of stay to reach the above functional goals is: *** Anticipated discharge destination: {anticipated dc setting:21604} 10. Overall Rehab/Functional Prognosis: {potential:3041437}   MD Signature: ***

## 2022-08-13 NOTE — Evaluation (Signed)
Occupational Therapy Evaluation Patient Details Name: Marco Acevedo MRN: [id number] DOB: 04/12/1934 Today's Date: 08/13/2022   History of Present Illness 88 yo male [place] was admitted on 5/2 for management of his L hip fracture with IM nailing.  Pt is mainly having issues with pain, but is motivated to move.  PMHx:  CAD, percutaneous coronary angioplasty, fall, heart stents, CHF, CKD3b, DM, COPD, GERD   Clinical Impression   PTA, pt was living with his wife and was independent; enjoys playing golf. Pt currently requiring Mod-Max A for LB ADLs and Min A for functional mobility with RW. Pt presenting with decreased balance, strength, activity tolerance, and safety awareness. SpO2 dropping to 80s on RA and requiring 2L O2 throughout activity. Pt very motivated and would perform well with intensive therapies. Pt would benefit from further acute OT to facilitate safe dc. Recommend dc to AIR for further OT to optimize safety, independence with ADLs, and return to PLOF.      Recommendations for follow up therapy are one component of a multi-disciplinary discharge planning process, led by the attending physician.  Recommendations may be updated based on patient status, additional functional criteria and insurance authorization.   Assistance Recommended at Discharge Frequent or constant Supervision/Assistance  Patient can return home with the following A little help with walking and/or transfers;A little help with bathing/dressing/bathroom    Functional Status Assessment  Patient has had a recent decline in their functional status and demonstrates the ability to make significant improvements in function in a reasonable and predictable amount of time.  Equipment Recommendations  Other (comment) (Defer to next venue)    Recommendations for Other Services       Precautions / Restrictions Precautions Precautions: Fall Precaution Comments: WBAT, no  precautions Restrictions Weight Bearing Restrictions: Yes LLE Weight Bearing: Weight bearing as tolerated      Mobility Bed Mobility Overal bed mobility: Needs Assistance Bed Mobility: Supine to Sit     Supine to sit: Mod assist     General bed mobility comments: Cues for pt to use RLE to bring hips towards EOB and bring LLE towards EOB with assistance.    Transfers Overall transfer level: Needs assistance Equipment used: Rolling walker (2 wheels) Transfers: Sit to/from Stand Sit to Stand: Min assist           General transfer comment: Min A for power up into standing.      Balance Overall balance assessment: Needs assistance, History of Falls Sitting-balance support: No upper extremity supported, Feet supported Sitting balance-Leahy Scale: Good     Standing balance support: No upper extremity supported, During functional activity, Bilateral upper extremity supported Standing balance-Leahy Scale: Poor                             ADL either performed or assessed with clinical judgement   ADL Overall ADL's : Needs assistance/impaired Eating/Feeding: Supervision/ safety;Set up;Sitting   Grooming: Wash/dry hands;Standing;Min guard   Upper Body Bathing: Set up;Supervision/ safety;Sitting   Lower Body Bathing: Moderate assistance;Sit to/from stand   Upper Body Dressing : Supervision/safety;Set up;Sitting   Lower Body Dressing: Maximal assistance;Sit to/from stand   Toilet Transfer: Minimal assistance;Ambulation;Rolling walker (2 wheels) (simulated to recliner)           Functional mobility during ADLs: Minimal assistance;Rolling walker (2 wheels) General ADL Comments: Pt with decreased balance, strength, and activity tolerance.     Vision  Perception     Praxis      Pertinent Vitals/Pain Pain Assessment Pain Assessment: Faces Faces Pain Scale: Hurts little more Pain Location: L hip Pain Descriptors / Indicators: Guarding,  Operative site guarding Pain Intervention(s): Monitored during session, Repositioned     Hand Dominance Right   Extremity/Trunk Assessment Upper Extremity Assessment Upper Extremity Assessment: Overall WFL for tasks assessed   Lower Extremity Assessment Lower Extremity Assessment: Defer to PT evaluation       Communication Communication Communication: No difficulties   Cognition Arousal/Alertness: Awake/alert Behavior During Therapy: WFL for tasks assessed/performed, Impulsive Overall Cognitive Status: Within Functional Limits for tasks assessed                                       General Comments  Wife and daughter-in-law present throughout session. SpO2 95% on 2L and 86% on RA.    Exercises     Shoulder Instructions      Home Living Family/patient expects to be discharged to:: Private residence Living Arrangements: Spouse/significant other Available Help at Discharge: Friend(s);Available 24 hours/day Type of Home: Other(Comment) Home Access: Stairs to enter Entrance Stairs-Number of Steps: 1 Entrance Stairs-Rails: None Home Layout: One level     Bathroom Shower/Tub: Producer, television/film/video: Standard Bathroom Accessibility: Yes How Accessible: Accessible via walker Home Equipment: Rolling Walker (2 wheels);Cane - quad;Shower seat - built in;Grab bars - tub/shower      Lives With: Spouse    Prior Functioning/Environment Prior Level of Function : Independent/Modified Independent               ADLs Comments: Enjoys playing goft. Does ADLs, IADLs, and driving.        OT Problem List: Decreased strength;Decreased activity tolerance;Decreased range of motion;Impaired balance (sitting and/or standing);Decreased safety awareness;Decreased knowledge of use of DME or AE;Decreased knowledge of precautions      OT Treatment/Interventions: Self-care/ADL training;Therapeutic exercise;Energy conservation;DME and/or AE  instruction;Therapeutic activities;Patient/family education    OT Goals(Current goals can be found in the care plan section) Acute Rehab OT Goals Patient Stated Goal: Get stronger OT Goal Formulation: With patient/family Time For Goal Achievement: 08/27/22 Potential to Achieve Goals: Good  OT Frequency: Min 2X/week    Co-evaluation              AM-PAC OT "6 Clicks" Daily Activity     Outcome Measure Help from another person eating meals?: None Help from another person taking care of personal grooming?: A Little Help from another person toileting, which includes using toliet, bedpan, or urinal?: A Little Help from another person bathing (including washing, rinsing, drying)?: A Lot Help from another person to put on and taking off regular upper body clothing?: A Little Help from another person to put on and taking off regular lower body clothing?: A Lot 6 Click Score: 17   End of Session Equipment Utilized During Treatment: Rolling walker (2 wheels) Nurse Communication: Mobility status  Activity Tolerance: Patient tolerated treatment well Patient left: in chair;with call bell/phone within reach;with chair alarm set;with family/visitor present  OT Visit Diagnosis: Unsteadiness on feet (R26.81);Other abnormalities of gait and mobility (R26.89);Muscle weakness (generalized) (M62.81)                Time: 1610-9604 OT Time Calculation (min): 49 min Charges:  OT General Charges $OT Visit: 1 Visit OT Evaluation $OT Eval Moderate Complexity: 1 Mod OT Treatments $Self  Care/Home Management : 23-37 mins  Marco Acevedo MSOT, Marco Acevedo Acute Rehab Office: [phone]  Marco Acevedo 08/13/2022, 4:30 PM

## 2022-08-14 ENCOUNTER — Encounter (HOSPITAL_COMMUNITY): Payer: Self-pay | Admitting: Orthopedic Surgery

## 2022-08-14 ENCOUNTER — Inpatient Hospital Stay (HOSPITAL_COMMUNITY): Payer: No Typology Code available for payment source

## 2022-08-14 LAB — RENAL FUNCTION PANEL
Albumin: 3 g/dL — ABNORMAL LOW (ref 3.5–5.0)
Anion gap: 9 (ref 5–15)
BUN: 59 mg/dL — ABNORMAL HIGH (ref 8–23)
CO2: 22 mmol/L (ref 22–32)
Calcium: 8.2 mg/dL — ABNORMAL LOW (ref 8.9–10.3)
Chloride: 103 mmol/L (ref 98–111)
Creatinine, Ser: 2.95 mg/dL — ABNORMAL HIGH (ref 0.61–1.24)
GFR, Estimated: 20 mL/min — ABNORMAL LOW (ref >60)
Glucose, Bld: 127 mg/dL — ABNORMAL HIGH (ref 70–99)
Phosphorus: 4.3 mg/dL (ref 2.5–4.6)
Potassium: 4.7 mmol/L (ref 3.5–5.1)
Sodium: 134 mmol/L — ABNORMAL LOW (ref 135–145)

## 2022-08-14 LAB — CBC WITH DIFFERENTIAL/PLATELET
Abs Immature Granulocytes: 0.03 10*3/uL (ref 0.00–0.07)
Basophils Absolute: 0.1 10*3/uL (ref 0.0–0.1)
Basophils Relative: 1 %
Eosinophils Absolute: 0.4 10*3/uL (ref 0.0–0.5)
Eosinophils Relative: 5 %
HCT: 28.8 % — ABNORMAL LOW (ref 39.0–52.0)
Hemoglobin: 9.4 g/dL — ABNORMAL LOW (ref 13.0–17.0)
Immature Granulocytes: 0 %
Lymphocytes Relative: 24 %
Lymphs Abs: 2 10*3/uL (ref 0.7–4.0)
MCH: 30.1 pg (ref 26.0–34.0)
MCHC: 32.6 g/dL (ref 30.0–36.0)
MCV: 92.3 fL (ref 80.0–100.0)
Monocytes Absolute: 0.8 10*3/uL (ref 0.1–1.0)
Monocytes Relative: 10 %
Neutro Abs: 4.8 10*3/uL (ref 1.7–7.7)
Neutrophils Relative %: 60 %
Platelets: 169 10*3/uL (ref 150–400)
RBC: 3.12 MIL/uL — ABNORMAL LOW (ref 4.22–5.81)
RDW: 14.1 % (ref 11.5–15.5)
WBC: 8 10*3/uL (ref 4.0–10.5)
nRBC: 0 % (ref 0.0–0.2)

## 2022-08-14 LAB — GLUCOSE, CAPILLARY
Glucose-Capillary: 155 mg/dL — ABNORMAL HIGH (ref 70–99)
Glucose-Capillary: 163 mg/dL — ABNORMAL HIGH (ref 70–99)
Glucose-Capillary: 171 mg/dL — ABNORMAL HIGH (ref 70–99)

## 2022-08-14 LAB — MAGNESIUM: Magnesium: 1.3 mg/dL — ABNORMAL LOW (ref 1.7–2.4)

## 2022-08-14 MED ORDER — MAGNESIUM SULFATE 2 GM/50ML IV SOLN
2.0000 g | Freq: Once | INTRAVENOUS | Status: AC
  Administered 2022-08-14: 2 g via INTRAVENOUS
  Filled 2022-08-14: qty 50

## 2022-08-14 MED ORDER — SENNOSIDES-DOCUSATE SODIUM 8.6-50 MG PO TABS
1.0000 | ORAL_TABLET | Freq: Every day | ORAL | Status: DC
  Administered 2022-08-14 – 2022-08-15 (×2): 1 via ORAL
  Filled 2022-08-14: qty 1

## 2022-08-14 MED ORDER — POLYETHYLENE GLYCOL 3350 17 G PO PACK
34.0000 g | PACK | Freq: Every day | ORAL | Status: DC
  Administered 2022-08-14 – 2022-08-15 (×2): 34 g via ORAL
  Filled 2022-08-14 (×3): qty 2

## 2022-08-14 MED ORDER — SODIUM CHLORIDE 0.9 % IV SOLN: INTRAVENOUS | Status: DC

## 2022-08-14 NOTE — Progress Notes (Signed)
PROGRESS NOTE    Marco Acevedo  ZOX:[id number] DOB: [birth year] DOA: 08/10/2022 PCP: Daisy Floro, MD  Outpatient Specialists:     Brief Narrative:  Patient is an 88 year old male with history of coronary artery disease status post stenting x 2, chronic heart failure with preserved ejection fraction, chronic kidney disease stage IIIb, type 2 diabetes mellitus, COPD and GERD.  Patient was admitted with left hip fracture following a fall.  Cardiology team's input is appreciated.  Patient has been cleared for surgery.  Patient is awaiting surgery this afternoon.  Echocardiogram revealed mild to moderate aortic stenosis and regurgitation, normal ejection fraction and grade 1 diastolic dysfunction.  08/11/2022: Patient seen.  No new complaints.  No chest pain, no shortness of breath.  Patient was fairly active prior to the fall.  Patient's son was updated.  For surgery this afternoon.  08/12/2022: Patient underwent intramedullary nailing yesterday, 08/11/2022.  Pain is not adequately controlled.  Pain seems to be limiting ability to perform physical therapy.  Will use lidocaine patch for the pain involving the left buttock area.  08/13/2022: Patient continues to report pain around the left ischial tuberosity area.  Reports that the pain is significant.  Will pursue pelvic CT.  The goal was to remain to optimize pain control.  PT OT input is appreciated.  Suspect likely SNF placement.   Assessment & Plan:   Principal Problem:   Hip fracture (HCC) Active Problems:   Left displaced femoral neck fracture (HCC)   CAD S/P percutaneous coronary angioplasty   Fall   Left intertrochanteric femoral fracture -S/p intertrochanteric nailing  -Optimize pain control.   -Postop management will be as per orthopedic team. - CIR evaluating patient for CIR admission  Left ischial tuberosity pain: -CT pelvis. -Continue lidocaine patch to the area. -Seems chronic. -Continue to optimize pain control.      History of CAD -Stable.  No chest pain. -Continue aspirin and statin and beta-blocker   CKD stage 4 Described in previous notes as 3b but gfr in 2021 was in the 20s and it has been there this hospitalization - bun is elevated, will trial 24 hours gentle hydration   IIDM -SSI for now   Constipation Reports no BM since admission - start miralax - make senna standing  COPD -Stable   GERD -Poorly controlled, continue PPI and sucralfate     DVT prophylaxis: Lovenox. Code Status: Full code. Family Communication: wife and daughter-in-law updated @ bedside 5/6 Disposition Plan: CIR vs snf   Consultants:  Cardiology Orthopedics.  Procedures:  For ORIF of left hip fracture this afternoon  Antimicrobials:  None   Subjective: Pain improving, tolerating po, worked with PT, no BM  Objective: Vitals:   08/13/22 0851 08/13/22 2019 08/14/22 0402 08/14/22 0829  BP: (!) 141/49 (!) 115/57 (!) 141/59 (!) 142/61  Pulse: 82 71 80 87  Resp: 18 20 20 18   Temp: 98 F (36.7 C) 99.8 F (37.7 C) 98.2 F (36.8 C) 98.7 F (37.1 C)  TempSrc: Oral Oral Oral Oral  SpO2: 95% 100% 96% 96%  Weight:      Height:        Intake/Output Summary (Last 24 hours) at 08/14/2022 1348 Last data filed at 08/14/2022 0900 Gross per 24 hour  Intake 480 ml  Output 1550 ml  Net -1070 ml   Filed Weights   08/10/22 1342 08/11/22 1151  Weight: 86.6 kg 87 kg    Examination:  General exam: Appears calm and comfortable  Respiratory system: Clear to auscultation.  Cardiovascular system: S1 & S2 heard, systolic murmur.   Gastrointestinal system: Abdomen is nondistended, soft and nontender. No organomegaly or masses felt. Normal bowel sounds heard. Central nervous system: Alert and oriented.  Moves all extremities.   Extremities: No leg edema.  Data Reviewed: I have personally reviewed following labs and imaging studies  CBC: Recent Labs  Lab 08/10/22 1340 08/10/22 1441 08/12/22 0229  08/14/22 0055  WBC 7.8  --  9.4 8.0  NEUTROABS 5.7  --  7.3 4.8  HGB 15.0 15.3 10.1* 9.4*  HCT 45.0 45.0 29.8* 28.8*  MCV 87.9  --  88.4 92.3  PLT 244  --  197 169   Basic Metabolic Panel: Recent Labs  Lab 08/10/22 1340 08/10/22 1441 08/12/22 0229 08/14/22 0055  NA 137 140 134* 134*  K 4.5 4.5 4.9 4.7  CL 105 106 101 103  CO2 22  --  24 22  GLUCOSE 113* 110* 181* 127*  BUN 54* 51* 56* 59*  CREATININE 2.58* 2.90* 2.79* 2.95*  CALCIUM 9.3  --  8.4* 8.2*  MG  --   --  1.6* 1.3*  PHOS  --   --  4.6 4.3   GFR: Estimated Creatinine Clearance: 19.6 mL/min (A) (by C-G formula based on SCr of 2.95 mg/dL (H)). Liver Function Tests: Recent Labs  Lab 08/12/22 0229 08/14/22 0055  ALBUMIN 3.2* 3.0*   No results for input(s): "LIPASE", "AMYLASE" in the last 168 hours. No results for input(s): "AMMONIA" in the last 168 hours. Coagulation Profile: Recent Labs  Lab 08/10/22 1340  INR 1.0   Cardiac Enzymes: No results for input(s): "CKTOTAL", "CKMB", "CKMBINDEX", "TROPONINI" in the last 168 hours. BNP (last 3 results) No results for input(s): "PROBNP" in the last 8760 hours. HbA1C: No results for input(s): "HGBA1C" in the last 72 hours. CBG: Recent Labs  Lab 08/13/22 0726 08/13/22 1116 08/13/22 2045 08/14/22 0813 08/14/22 1115  GLUCAP 134* 164* 157* 155* 163*   Lipid Profile: No results for input(s): "CHOL", "HDL", "LDLCALC", "TRIG", "CHOLHDL", "LDLDIRECT" in the last 72 hours. Thyroid Function Tests: No results for input(s): "TSH", "T4TOTAL", "FREET4", "T3FREE", "THYROIDAB" in the last 72 hours. Anemia Panel: No results for input(s): "VITAMINB12", "FOLATE", "FERRITIN", "TIBC", "IRON", "RETICCTPCT" in the last 72 hours. Urine analysis:    Component Value Date/Time   COLORURINE YELLOW (A) 10/19/2017 1008   APPEARANCEUR CLEAR (A) 10/19/2017 1008   LABSPEC 1.025 10/19/2017 1008   PHURINE 5.0 10/19/2017 1008   GLUCOSEU NEGATIVE 10/19/2017 1008   HGBUR NEGATIVE  10/19/2017 1008   BILIRUBINUR NEGATIVE 10/19/2017 1008   KETONESUR NEGATIVE 10/19/2017 1008   PROTEINUR 30 (A) 10/19/2017 1008   NITRITE NEGATIVE 10/19/2017 1008   LEUKOCYTESUR NEGATIVE 10/19/2017 1008   Sepsis Labs: @LABRCNTIP (procalcitonin:4,lacticidven:4)  ) Recent Results (from the past 240 hour(s))  Surgical pcr screen     Status: None   Collection Time: 08/11/22  6:51 AM   Specimen: Nasal Mucosa; Nasal Swab  Result Value Ref Range Status   MRSA, PCR NEGATIVE NEGATIVE Final   Staphylococcus aureus NEGATIVE NEGATIVE Final    Comment: (NOTE) The Xpert SA Assay (FDA approved for NASAL specimens in patients 20 years of age and older), is one component of a comprehensive surveillance program. It is not intended to diagnose infection nor to guide or monitor treatment. Performed at [facility], [address] [place], [place] [address]          Radiology Studies: No results  found.      Scheduled Meds:  allopurinol  100 mg Oral Daily   amLODipine  5 mg Oral Daily   aspirin EC  81 mg Oral Daily   atorvastatin  80 mg Oral q1800   carvedilol  3.125 mg Oral BID WC   enoxaparin (LOVENOX) injection  30 mg Subcutaneous Q24H   feeding supplement  237 mL Oral BID BM   fentaNYL (SUBLIMAZE) injection  50 mcg Intravenous Once   finasteride  5 mg Oral Daily   gabapentin  300 mg Oral BID   guaiFENesin  400 mg Oral QHS   insulin aspart  0-9 Units Subcutaneous TID WC   isosorbide mononitrate  120 mg Oral Daily   lidocaine  1 patch Transdermal Q24H   pantoprazole  40 mg Oral Daily   primidone  50 mg Oral QHS   sucralfate  1 g Oral QID   Continuous Infusions:  magnesium sulfate bolus IVPB       LOS: 4 days    Time spent: 35 minutes.    Shonna Chock, MD  [facility] 7PM-7AM contact night coverage as above

## 2022-08-14 NOTE — Progress Notes (Signed)
Physical Therapy Treatment Patient Details Name: Marco Acevedo MRN: [id number] DOB: December 31, 1934 Today's Date: 08/14/2022   History of Present Illness 88 yo [place] admitted on 5/2 for management of his L hip fracture with IM nailing.  Pt is mainly having issues with pain, but is motivated to move.  PMHx:  CAD, percutaneous coronary angioplasty, fall, heart stents, CHF, CKD3b, DM, COPD, GERD    PT Comments    Pt was seen for further mobility after evaluation, with pt having a better day regarding assist needed and generally increased endurance.  He is motivated, and had to give him guidance on setting limits of distance and use of walker for safety and prevention of fall risk.  Follow along with him for progressing gait, to challenge balance and to work on LE strengthening as tolerated.  Follow acutely for goals of PT as outlined on POC.  Recommending rehab inpt for <3 hours a day moving forward.   Recommendations for follow up therapy are one component of a multi-disciplinary discharge planning process, led by the attending physician.  Recommendations may be updated based on patient status, additional functional criteria and insurance authorization.  Follow Up Recommendations       Assistance Recommended at Discharge Frequent or constant Supervision/Assistance  Patient can return home with the following A lot of help with bathing/dressing/bathroom;Assistance with cooking/housework;Assist for transportation;Help with stairs or ramp for entrance;A little help with walking and/or transfers   Equipment Recommendations  Rolling walker (2 wheels)    Recommendations for Other Services Rehab consult     Precautions / Restrictions Precautions Precautions: Fall Precaution Comments: WBAT, no precautions Restrictions Weight Bearing Restrictions: Yes LLE Weight Bearing: Weight bearing as tolerated     Mobility  Bed Mobility Overal bed mobility: Needs  Assistance Bed Mobility: Supine to Sit     Supine to sit: Mod assist     General bed mobility comments: mod for final scoot OOB and then min initially to assist LLE    Transfers Overall transfer level: Needs assistance Equipment used: Rolling walker (2 wheels) Transfers: Sit to/from Stand Sit to Stand: Min assist           General transfer comment: min assist with pt using bed rail initially    Ambulation/Gait Ambulation/Gait assistance: Min guard Gait Distance (Feet): 120 Feet Assistive device: Rolling walker (2 wheels), 1 person hand held assist Gait Pattern/deviations: Step-through pattern, Step-to pattern, Decreased stride length, Decreased weight shift to left Gait velocity: reduced Gait velocity interpretation: <1.31 ft/sec, indicative of household ambulator Pre-gait activities: standing balance General Gait Details: Pt was better in control of walker and did have to give him limitations as he is more motivated than aware of his limits of UE endurance and to manage LLE pain   Stairs             Wheelchair Mobility    Modified Rankin (Stroke Patients Only)       Balance Overall balance assessment: Needs assistance Sitting-balance support: Feet supported Sitting balance-Leahy Scale: Good     Standing balance support: Bilateral upper extremity supported, During functional activity Standing balance-Leahy Scale: Poor                              Cognition Arousal/Alertness: Awake/alert Behavior During Therapy: WFL for tasks assessed/performed, Impulsive Overall Cognitive Status: Within Functional Limits for tasks assessed  Exercises General Exercises - Lower Extremity Ankle Circles/Pumps: AAROM, 5 reps Long Arc Quad: AROM, AAROM, 10 reps Heel Slides: AROM, AAROM, 10 reps Hip ABduction/ADduction: AROM, AAROM, 10 reps    General Comments General comments (skin integrity,  edema, etc.): Pt is up wiht better pain control and better safety awareness but does need limits of distance and cues for safety of choices to move on walker such as limiting retropulsion      Pertinent Vitals/Pain Pain Assessment Pain Assessment: Faces Faces Pain Scale: Hurts a little bit Pain Location: L hip Pain Descriptors / Indicators: Operative site guarding Pain Intervention(s): Limited activity within patient's tolerance, Monitored during session, Premedicated before session, Repositioned    Home Living                          Prior Function            PT Goals (current goals can now be found in the care plan section) Acute Rehab PT Goals Patient Stated Goal: to get home and back to usual activities Progress towards PT goals: Progressing toward goals    Frequency    Min 3X/week      PT Plan Current plan remains appropriate    Co-evaluation              AM-PAC PT "6 Clicks" Mobility   Outcome Measure  Help needed turning from your back to your side while in a flat bed without using bedrails?: A Little Help needed moving from lying on your back to sitting on the side of a flat bed without using bedrails?: A Lot Help needed moving to and from a bed to a chair (including a wheelchair)?: A Little Help needed standing up from a chair using your arms (e.g., wheelchair or bedside chair)?: A Little Help needed to walk in hospital room?: A Little Help needed climbing 3-5 steps with a railing? : Total 6 Click Score: 15    End of Session Equipment Utilized During Treatment: Gait belt Activity Tolerance: Patient limited by fatigue;Patient limited by pain Patient left: in chair;with call bell/phone within reach;with chair alarm set Nurse Communication: Mobility status PT Visit Diagnosis: Unsteadiness on feet (R26.81);Muscle weakness (generalized) (M62.81);Pain Pain - Right/Left: Left Pain - part of body: Hip     Time: 1610-9604 PT Time Calculation  (min) (ACUTE ONLY): 41 min  Charges:  $Gait Training: 8-22 mins $Therapeutic Exercise: 8-22 mins $Therapeutic Activity: 8-22 mins      Ivar Drape 08/14/2022, 2:58 PM  Samul Dada, PT PhD Acute Rehab Dept. Number: [facility] [id number] and [facility] [phone]

## 2022-08-14 NOTE — Progress Notes (Signed)
Inpatient Rehab Admissions Coordinator:  Saw pt at bedside. Informed him that awaiting medical clearance and insurance authorization. Will continue to follow.   Ashia Dehner Graves Madden, MS, CCC-SLP Admissions Coordinator [phone]  

## 2022-08-15 DIAGNOSIS — S72002A Fracture of unspecified part of neck of left femur, initial encounter for closed fracture: Secondary | ICD-10-CM | POA: Diagnosis not present

## 2022-08-15 LAB — GLUCOSE, CAPILLARY
Glucose-Capillary: 155 mg/dL — ABNORMAL HIGH (ref 70–99)
Glucose-Capillary: 158 mg/dL — ABNORMAL HIGH (ref 70–99)
Glucose-Capillary: 221 mg/dL — ABNORMAL HIGH (ref 70–99)

## 2022-08-15 MED ORDER — NITROGLYCERIN 0.4 MG SL SUBL: 0.4000 mg | SUBLINGUAL_TABLET | SUBLINGUAL | Status: DC | PRN

## 2022-08-15 MED ORDER — CALCIUM CARBONATE ANTACID 500 MG PO CHEW: 2.0000 | CHEWABLE_TABLET | Freq: Three times a day (TID) | ORAL | Status: DC | PRN

## 2022-08-15 MED ORDER — ALUM & MAG HYDROXIDE-SIMETH 200-200-20 MG/5ML PO SUSP: 30.0000 mL | ORAL | Status: DC | PRN

## 2022-08-15 NOTE — Hospital Course (Addendum)
Marco Acevedo is an 88 year old male with PMH CAD, CHF, CKD4, DMII, COPD, and GERD who presented after a mechanical fall.  He was found to have an acute intertrochanteric fracture of the left proximal femur.  He was admitted for orthopedic evaluation and cardiology consult for clearance. He underwent IM nail fixation with orthopedic surgery on 08/11/2022.

## 2022-08-15 NOTE — Progress Notes (Signed)
Physical Therapy Treatment Patient Details Name: Marco Acevedo MRN: [id number] DOB: 07-14-1934 Today's Date: 08/15/2022   History of Present Illness 88 yo [place] was admitted on 5/2 for management of his L hip fracture with IM nailing.  Pt is mainly having issues with pain, but is motivated to move.  PMHx:  CAD, percutaneous coronary angioplasty, fall, heart stents, CHF, CKD3b, DM, COPD, GERD    PT Comments    Pt very motivated and making excellent progress.  He has good pain control.  Does need cues for safety, transfer technique.  Pt required min A for sit to stand but at last visit was mod A bed mobility (in chair today).  Pt tolerating exercises well. Pt with slow gait speed and does remain fall risk - continue to recommend further rehab at d/c prior to return home.  Cont POC.     Recommendations for follow up therapy are one component of a multi-disciplinary discharge planning process, led by the attending physician.  Recommendations may be updated based on patient status, additional functional criteria and insurance authorization.  Follow Up Recommendations       Assistance Recommended at Discharge Frequent or constant Supervision/Assistance  Patient can return home with the following A lot of help with bathing/dressing/bathroom;Assistance with cooking/housework;Assist for transportation;Help with stairs or ramp for entrance;A little help with walking and/or transfers   Equipment Recommendations  Rolling walker (2 wheels)    Recommendations for Other Services Rehab consult     Precautions / Restrictions Precautions Precautions: Fall Precaution Comments: WBAT, no precautions Restrictions Weight Bearing Restrictions: Yes LLE Weight Bearing: Weight bearing as tolerated     Mobility  Bed Mobility               General bed mobility comments: in chair and wants to return    Transfers Overall transfer level: Needs assistance Equipment  used: Rolling walker (2 wheels) Transfers: Sit to/from Stand Sit to Stand: Min assist           General transfer comment: Min A with cues for hand placement and L LE management    Ambulation/Gait Ambulation/Gait assistance: Min guard Gait Distance (Feet): 80 Feet Assistive device: Rolling walker (2 wheels) Gait Pattern/deviations: Step-to pattern, Decreased stride length, Decreased weight shift to left Gait velocity: decrased Gait velocity interpretation: <1.8 ft/sec, indicate of risk for recurrent falls   General Gait Details: Cues for sequence and RW proximity.  Pt with slow gait and stopping when talking.  Required 1 extended rest break   Stairs             Wheelchair Mobility    Modified Rankin (Stroke Patients Only)       Balance Overall balance assessment: Needs assistance Sitting-balance support: Feet supported Sitting balance-Leahy Scale: Good     Standing balance support: Bilateral upper extremity supported, During functional activity Standing balance-Leahy Scale: Poor Standing balance comment: Needs RW and min g                            Cognition Arousal/Alertness: Awake/alert Behavior During Therapy: WFL for tasks assessed/performed Overall Cognitive Status: Within Functional Limits for tasks assessed                                 General Comments: Cues for monitoring activity/fatigue        Exercises  Total Joint Exercises Knee Flexion: AROM, Right, 10 reps, Seated General Exercises - Lower Extremity Ankle Circles/Pumps: AROM, Both, 10 reps, Seated Quad Sets: AROM, Left, 10 reps, Seated (with end range stretch 10 sec hold) Long Arc Quad: AROM, Left, 10 reps, Seated Heel Slides: AAROM, Left, 10 reps (reclined) Hip ABduction/ADduction: AAROM, Left, 10 reps (reclined) Toe Raises: AAROM, Left, 10 reps, Seated    General Comments        Pertinent Vitals/Pain Pain Assessment Pain Assessment: Faces Faces Pain  Scale: Hurts a little bit Pain Location: L hip Pain Descriptors / Indicators: Operative site guarding Pain Intervention(s): Limited activity within patient's tolerance, Monitored during session, Repositioned, Premedicated before session    Home Living                          Prior Function            PT Goals (current goals can now be found in the care plan section) Progress towards PT goals: Progressing toward goals    Frequency    Min 3X/week      PT Plan Current plan remains appropriate    Co-evaluation              AM-PAC PT "6 Clicks" Mobility   Outcome Measure  Help needed turning from your back to your side while in a flat bed without using bedrails?: A Little Help needed moving from lying on your back to sitting on the side of a flat bed without using bedrails?: A Lot Help needed moving to and from a bed to a chair (including a wheelchair)?: A Little Help needed standing up from a chair using your arms (e.g., wheelchair or bedside chair)?: A Little Help needed to walk in hospital room?: A Little Help needed climbing 3-5 steps with a railing? : Total 6 Click Score: 15    End of Session Equipment Utilized During Treatment: Gait belt Activity Tolerance: Patient tolerated treatment well Patient left: in chair;with call bell/phone within reach;with chair alarm set;with family/visitor present Nurse Communication: Mobility status PT Visit Diagnosis: Unsteadiness on feet (R26.81);Muscle weakness (generalized) (M62.81);Pain Pain - Right/Left: Left Pain - part of body: Hip     Time: 1540-1610 PT Time Calculation (min) (ACUTE ONLY): 30 min  Charges:  $Gait Training: 8-22 mins $Therapeutic Exercise: 8-22 mins                     Anise Salvo, PT Acute Rehab [facility] Re[facility] [phone]    Rayetta Humphrey 08/15/2022, 4:19 PM

## 2022-08-15 NOTE — Care Management Important Message (Signed)
Important Message  Patient Details  Name: Marco Acevedo MRN: [id number] Date of Birth: [birth year]   Medicare Important Message Given:  Yes     Sherilyn Banker 08/15/2022, 3:27 PM

## 2022-08-15 NOTE — Plan of Care (Signed)
  Problem: Education: Goal: Ability to describe self-care measures that may prevent or decrease complications (Diabetes Survival Skills Education) will improve Outcome: Progressing Goal: Individualized Educational Video(s) Outcome: Progressing   Problem: Coping: Goal: Ability to adjust to condition or change in health will improve Outcome: Progressing   Problem: Fluid Volume: Goal: Ability to maintain a balanced intake and output will improve Outcome: Progressing   Problem: Health Behavior/Discharge Planning: Goal: Ability to identify and utilize available resources and services will improve Outcome: Progressing Goal: Ability to manage health-related needs will improve Outcome: Progressing   Problem: Metabolic: Goal: Ability to maintain appropriate glucose levels will improve Outcome: Progressing   Problem: Nutritional: Goal: Maintenance of adequate nutrition will improve Outcome: Progressing Goal: Progress toward achieving an optimal weight will improve Outcome: Progressing   Problem: Skin Integrity: Goal: Risk for impaired skin integrity will decrease Outcome: Progressing   Problem: Tissue Perfusion: Goal: Adequacy of tissue perfusion will improve Outcome: Progressing   Problem: Education: Goal: Knowledge of General Education information will improve Description: Including pain rating scale, medication(s)/side effects and non-pharmacologic comfort measures Outcome: Progressing   Problem: Health Behavior/Discharge Planning: Goal: Ability to manage health-related needs will improve Outcome: Progressing   Problem: Clinical Measurements: Goal: Ability to maintain clinical measurements within normal limits will improve Outcome: Progressing Goal: Will remain free from infection Outcome: Progressing   

## 2022-08-15 NOTE — Progress Notes (Signed)
Pt complains of sob and chest tightening while repositioning in bed but it stops as soon he is settled. Pt requesting nitroglycerin in case he needs it. Pt takes it at home but doesn't have an order here. Attending MD notified

## 2022-08-15 NOTE — Progress Notes (Signed)
  Inpatient Rehabilitation Admissions Coordinator   I am pursuing approval with [facility] as well  as [employer] for possible [facility] admit. I met with patient at bedside with son and wife and they are aware that acute stay was denied by the [facility] due to a out of network issue with the 72 hr notification. If the [facility] will not approve [facility], patient would then want SNF with [facility] coverage.  Ottie Glazier, RN, MSN Rehab Admissions Coordinator [phone] 08/15/2022 1:20 PM

## 2022-08-15 NOTE — Progress Notes (Signed)
    Subjective: Patient reports buttock pain better controlled. Has been working very well with PT. Ambulating over 100 feet yesterday. Discussed that the heaviness, soreness, and weakness are part of the recovery process.  Objective:   VITALS:   Vitals:   08/14/22 1456 08/14/22 2012 08/15/22 0253 08/15/22 0519  BP: (!) 124/50 (!) 129/113 (!) 146/65 (!) 142/60  Pulse: 73 86 81 82  Resp:  20 16 20   Temp: 98.4 F (36.9 C) 98.4 F (36.9 C) 98.3 F (36.8 C) 98.2 F (36.8 C)  TempSrc: Oral Oral Oral Oral  SpO2: 96% 94% 92% 93%  Weight:      Height:          Latest Ref Rng & Units 08/14/2022   12:55 AM 08/12/2022    2:29 AM 08/10/2022    2:41 PM  CBC  WBC 4.0 - 10.5 K/uL 8.0  9.4    Hemoglobin 13.0 - 17.0 g/dL 9.4  16.1  09.6   Hematocrit 39.0 - 52.0 % 28.8  29.8  45.0   Platelets 150 - 400 K/uL 169  197        Latest Ref Rng & Units 08/14/2022   12:55 AM 08/12/2022    2:29 AM 08/10/2022    2:41 PM  BMP  Glucose 70 - 99 mg/dL 045  409  811   BUN 8 - 23 mg/dL 59  56  51   Creatinine 0.61 - 1.24 mg/dL 9.14  7.82  9.56   Sodium 135 - 145 mmol/L 134  134  140   Potassium 3.5 - 5.1 mmol/L 4.7  4.9  4.5   Chloride 98 - 111 mmol/L 103  101  106   CO2 22 - 32 mmol/L 22  24    Calcium 8.9 - 10.3 mg/dL 8.2  8.4     Intake/Output      05/06 0701 05/07 0700 05/07 0701 05/08 0700   P.O. 240    I.V. (mL/kg) 326.1 (3.7)    Total Intake(mL/kg) 566.1 (6.5)    Urine (mL/kg/hr) 2325 (1.1)    Total Output 2325    Net -1758.9          New CT pelvis reviewed demonstrates well reduced intertroch hip fx, hardware, intact no adverse features, no evidence of additional pelvic fx.  Physical Exam: General: NAD.  Laying in bed, shaking, covered in blankets Resp: No increased wob Cardio: regular rate and rhythm ABD soft Neurologically intact MSK Neurovascularly intact Sensation intact distally Intact pulses distally Dorsiflexion/Plantar flexion intact Minimal if any L hip or knee ROM  tolerated Incision: dressings C/D/I Ecchymosis to L lateral thigh   Assessment: 4 Days Post-Op  S/P Procedure(s) (LRB): INTRAMEDULLARY (IM) NAIL INTERTROCHANTERIC (Left) by Dr. Blanchie Dessert on 08/11/22  Principal Problem:   Hip fracture (HCC) Active Problems:   CKD (chronic kidney disease) stage 4, GFR 15-29 ml/min (HCC)   Left displaced femoral neck fracture (HCC)   CAD S/P percutaneous coronary angioplasty   Fall   Plan:   Advance diet Up with therapy Incentive Spirometry Elevate and Apply ice  Weightbearing: WBAT LLE Insicional and dressing care: Reinforce dressings as needed Orthopedic device(s): None Showering: Keep dressing dry VTE prophylaxis:  Lovenox 30mg  daily  , SCDs, ambulation Pain control: Going to add Oxycodone and increase Gabapentin  Follow - up plan:  in office with Dr. Blanchie Dessert   Dispo:  PT/OT recommending CIR     Joen Laura, MD Office [phone] 08/15/2022, 7:16 AM

## 2022-08-15 NOTE — Plan of Care (Signed)
  Problem: Coping: Goal: Ability to adjust to condition or change in health will improve Outcome: Progressing   Problem: Metabolic: Goal: Ability to maintain appropriate glucose levels will improve Outcome: Progressing   Problem: Nutritional: Goal: Maintenance of adequate nutrition will improve Outcome: Progressing Goal: Progress toward achieving an optimal weight will improve Outcome: Progressing   Problem: Skin Integrity: Goal: Risk for impaired skin integrity will decrease Outcome: Progressing   Problem: Clinical Measurements: Goal: Will remain free from infection Outcome: Progressing Goal: Diagnostic test results will improve Outcome: Progressing Goal: Respiratory complications will improve Outcome: Progressing Goal: Cardiovascular complication will be avoided Outcome: Progressing   Problem: Activity: Goal: Risk for activity intolerance will decrease Outcome: Progressing   Problem: Nutrition: Goal: Adequate nutrition will be maintained Outcome: Progressing   Problem: Coping: Goal: Level of anxiety will decrease Outcome: Progressing   Problem: Elimination: Goal: Will not experience complications related to bowel motility Outcome: Progressing   Problem: Safety: Goal: Ability to remain free from injury will improve Outcome: Progressing   Problem: Skin Integrity: Goal: Risk for impaired skin integrity will decrease Outcome: Progressing

## 2022-08-15 NOTE — Progress Notes (Addendum)
Progress Note    Marco Acevedo   OZH:[id number]  DOB: 05-28-1934  DOA: 08/10/2022     5 PCP: Marco Floro, MD  Initial CC: fall at home  Hospital Course: Marco Acevedo is an 88 year old male with PMH CAD, CHF, CKD3b, DMII, COPD, and GERD who presented after a mechanical fall.  He was found to have an acute intertrochanteric fracture of the left proximal femur.  He was admitted for orthopedic evaluation and cardiology consult for clearance. He underwent IM nail fixation with orthopedic surgery on 08/11/2022.  Interval History:  No events overnight.  Resting in bed comfortably when seen this morning.  Working well with PT and is motivated for going to rehab once approved.  Assessment and Plan:  Left intertrochanteric femoral fracture -S/p intertrochanteric nailing on 08/11/22 - continue working with PT - stable for CIF   Left ischial tuberosity pain -Continue lidocaine patch to the area -Continue to optimize pain control   History of CAD -Stable.  No chest pain. -Continue aspirin, statin, and beta-blocker  ABLA - baseline Hgb ~15 g/dL - expected postop drop and should stabilize and slowly rise - vitals stable - can repeat Hgb in am to ensure stability   CKD stage 4 - previously 3b but may have progressed to stage IV at this point as has remained in Stage 4 numbers   DMII -SSI for now   Constipation - continue laxatives   COPD -Stable   GERD -Poorly controlled, continue PPI and sucralfate - add maalox  Old records reviewed in assessment of this patient  Antimicrobials:   DVT prophylaxis:  enoxaparin (LOVENOX) injection 30 mg Start: 08/11/22 1000   Code Status:   Code Status: Full Code  Mobility Assessment (last 72 hours)     Mobility Assessment     Row Name 08/15/22 0519 08/14/22 0845 08/13/22 2130 08/13/22 1600 08/13/22 0805   Does patient have an order for bedrest or is patient medically unstable No - Continue assessment No - Continue assessment  No - Continue assessment -- No - Continue assessment   What is the highest level of mobility based on the progressive mobility assessment? Level 5 (Walks with assist in room/hall) - Balance while stepping forward/back and can walk in room with assist - Complete Level 5 (Walks with assist in room/hall) - Balance while stepping forward/back and can walk in room with assist - Complete Level 5 (Walks with assist in room/hall) - Balance while stepping forward/back and can walk in room with assist - Complete Level 5 (Walks with assist in room/hall) - Balance while stepping forward/back and can walk in room with assist - Complete Level 4 (Walks with assist in room) - Balance while marching in place and cannot step forward and back - Complete    Row Name 08/12/22 2100 08/12/22 1500         Does patient have an order for bedrest or is patient medically unstable No - Continue assessment --      What is the highest level of mobility based on the progressive mobility assessment? Level 4 (Walks with assist in room) - Balance while marching in place and cannot step forward and back - Complete Level 4 (Walks with assist in room) - Balance while marching in place and cannot step forward and back - Complete               Barriers to discharge: awaiting rehab approval via insurance Disposition Plan:  Pending CIR Status is: Inpt  Objective: Blood pressure (!) 144/50, pulse 79, temperature 98.4 F (36.9 C), resp. rate 17, height 5\' 8"  (1.727 m), weight 87 kg, SpO2 92 %.  Examination:  Physical Exam Constitutional:      General: He is not in acute distress.    Appearance: Normal appearance.  HENT:     Head: Normocephalic and atraumatic.     Mouth/Throat:     Mouth: Mucous membranes are moist.  Eyes:     Extraocular Movements: Extraocular movements intact.  Cardiovascular:     Rate and Rhythm: Normal rate and regular rhythm.  Pulmonary:     Effort: Pulmonary effort is normal. No respiratory distress.      Breath sounds: Normal breath sounds. No wheezing.  Abdominal:     General: Bowel sounds are normal. There is no distension.     Palpations: Abdomen is soft.     Tenderness: There is no abdominal tenderness.  Musculoskeletal:        General: Normal range of motion.     Cervical back: Normal range of motion and neck supple.     Comments: Left surgical dressing in place; soft compartments   Skin:    General: Skin is warm and dry.  Neurological:     General: No focal deficit present.     Mental Status: He is alert.  Psychiatric:        Mood and Affect: Mood normal.        Behavior: Behavior normal.      Consultants:  Ortho  Procedures:  08/11/22: Left IM nail fixation  Data Reviewed: Results for orders placed or performed during the hospital encounter of 08/10/22 (from the past 24 hour(s))  Glucose, capillary     Status: Abnormal   Collection Time: 08/14/22  4:16 PM  Result Value Ref Range   Glucose-Capillary 171 (H) 70 - 99 mg/dL  Glucose, capillary     Status: Abnormal   Collection Time: 08/15/22 12:04 PM  Result Value Ref Range   Glucose-Capillary 155 (H) 70 - 99 mg/dL    I have reviewed pertinent nursing notes, vitals, labs, and images as necessary. I have ordered labwork to follow up on as indicated.  I have reviewed the last notes from staff over past 24 hours. I have discussed patient's care plan and test results with nursing staff, CM/SW, and other staff as appropriate.    LOS: 5 days   Lewie Chamber, MD [facility] 08/15/2022, 12:42 PM

## 2022-08-16 ENCOUNTER — Encounter (HOSPITAL_COMMUNITY): Payer: Self-pay | Admitting: Physical Medicine and Rehabilitation

## 2022-08-16 ENCOUNTER — Inpatient Hospital Stay (HOSPITAL_COMMUNITY)
Admission: RE | Admit: 2022-08-16 | Discharge: 2022-08-31 | DRG: 560 | Disposition: A | Discharge status: Home Health | Payer: No Typology Code available for payment source | Source: Intra-hospital | Attending: Physical Medicine and Rehabilitation | Admitting: Physical Medicine and Rehabilitation

## 2022-08-16 ENCOUNTER — Other Ambulatory Visit: Payer: Self-pay

## 2022-08-16 DIAGNOSIS — Z888 Allergy status to other drugs, medicaments and biological substances status: Secondary | ICD-10-CM

## 2022-08-16 DIAGNOSIS — R3915 Urgency of urination: Secondary | ICD-10-CM | POA: Diagnosis present

## 2022-08-16 DIAGNOSIS — N401 Enlarged prostate with lower urinary tract symptoms: Secondary | ICD-10-CM | POA: Diagnosis present

## 2022-08-16 DIAGNOSIS — J9811 Atelectasis: Secondary | ICD-10-CM | POA: Diagnosis not present

## 2022-08-16 DIAGNOSIS — K30 Functional dyspepsia: Secondary | ICD-10-CM | POA: Diagnosis present

## 2022-08-16 DIAGNOSIS — I25119 Atherosclerotic heart disease of native coronary artery with unspecified angina pectoris: Secondary | ICD-10-CM | POA: Diagnosis present

## 2022-08-16 DIAGNOSIS — D62 Acute posthemorrhagic anemia: Secondary | ICD-10-CM | POA: Diagnosis not present

## 2022-08-16 DIAGNOSIS — E1140 Type 2 diabetes mellitus with diabetic neuropathy, unspecified: Secondary | ICD-10-CM | POA: Diagnosis present

## 2022-08-16 DIAGNOSIS — I5032 Chronic diastolic (congestive) heart failure: Secondary | ICD-10-CM | POA: Diagnosis present

## 2022-08-16 DIAGNOSIS — J449 Chronic obstructive pulmonary disease, unspecified: Secondary | ICD-10-CM | POA: Diagnosis present

## 2022-08-16 DIAGNOSIS — Z9049 Acquired absence of other specified parts of digestive tract: Secondary | ICD-10-CM

## 2022-08-16 DIAGNOSIS — W109XXD Fall (on) (from) unspecified stairs and steps, subsequent encounter: Secondary | ICD-10-CM | POA: Diagnosis present

## 2022-08-16 DIAGNOSIS — I959 Hypotension, unspecified: Secondary | ICD-10-CM | POA: Diagnosis present

## 2022-08-16 DIAGNOSIS — I350 Nonrheumatic aortic (valve) stenosis: Secondary | ICD-10-CM | POA: Diagnosis present

## 2022-08-16 DIAGNOSIS — N1832 Chronic kidney disease, stage 3b: Secondary | ICD-10-CM | POA: Diagnosis not present

## 2022-08-16 DIAGNOSIS — E785 Hyperlipidemia, unspecified: Secondary | ICD-10-CM | POA: Diagnosis present

## 2022-08-16 DIAGNOSIS — Z87891 Personal history of nicotine dependence: Secondary | ICD-10-CM

## 2022-08-16 DIAGNOSIS — Z7984 Long term (current) use of oral hypoglycemic drugs: Secondary | ICD-10-CM

## 2022-08-16 DIAGNOSIS — R944 Abnormal results of kidney function studies: Secondary | ICD-10-CM | POA: Diagnosis present

## 2022-08-16 DIAGNOSIS — S72142D Displaced intertrochanteric fracture of left femur, subsequent encounter for closed fracture with routine healing: Secondary | ICD-10-CM | POA: Diagnosis present

## 2022-08-16 DIAGNOSIS — L299 Pruritus, unspecified: Secondary | ICD-10-CM | POA: Diagnosis present

## 2022-08-16 DIAGNOSIS — S728X2A Other fracture of left femur, initial encounter for closed fracture: Secondary | ICD-10-CM | POA: Diagnosis not present

## 2022-08-16 DIAGNOSIS — Z7902 Long term (current) use of antithrombotics/antiplatelets: Secondary | ICD-10-CM

## 2022-08-16 DIAGNOSIS — K5900 Constipation, unspecified: Secondary | ICD-10-CM | POA: Diagnosis present

## 2022-08-16 DIAGNOSIS — Z79899 Other long term (current) drug therapy: Secondary | ICD-10-CM

## 2022-08-16 DIAGNOSIS — R21 Rash and other nonspecific skin eruption: Secondary | ICD-10-CM | POA: Diagnosis present

## 2022-08-16 DIAGNOSIS — E669 Obesity, unspecified: Secondary | ICD-10-CM | POA: Diagnosis present

## 2022-08-16 DIAGNOSIS — M1990 Unspecified osteoarthritis, unspecified site: Secondary | ICD-10-CM | POA: Diagnosis present

## 2022-08-16 DIAGNOSIS — R0902 Hypoxemia: Secondary | ICD-10-CM | POA: Diagnosis not present

## 2022-08-16 DIAGNOSIS — G4733 Obstructive sleep apnea (adult) (pediatric): Secondary | ICD-10-CM | POA: Diagnosis present

## 2022-08-16 DIAGNOSIS — E1165 Type 2 diabetes mellitus with hyperglycemia: Secondary | ICD-10-CM | POA: Diagnosis present

## 2022-08-16 DIAGNOSIS — R350 Frequency of micturition: Secondary | ICD-10-CM | POA: Diagnosis present

## 2022-08-16 DIAGNOSIS — F40240 Claustrophobia: Secondary | ICD-10-CM | POA: Diagnosis present

## 2022-08-16 DIAGNOSIS — N184 Chronic kidney disease, stage 4 (severe): Secondary | ICD-10-CM | POA: Diagnosis not present

## 2022-08-16 DIAGNOSIS — R251 Tremor, unspecified: Secondary | ICD-10-CM | POA: Diagnosis present

## 2022-08-16 DIAGNOSIS — M533 Sacrococcygeal disorders, not elsewhere classified: Secondary | ICD-10-CM | POA: Diagnosis present

## 2022-08-16 DIAGNOSIS — K219 Gastro-esophageal reflux disease without esophagitis: Secondary | ICD-10-CM | POA: Diagnosis present

## 2022-08-16 DIAGNOSIS — M79605 Pain in left leg: Secondary | ICD-10-CM | POA: Diagnosis not present

## 2022-08-16 DIAGNOSIS — I10 Essential (primary) hypertension: Secondary | ICD-10-CM | POA: Diagnosis not present

## 2022-08-16 DIAGNOSIS — M109 Gout, unspecified: Secondary | ICD-10-CM | POA: Diagnosis present

## 2022-08-16 DIAGNOSIS — Z7982 Long term (current) use of aspirin: Secondary | ICD-10-CM

## 2022-08-16 DIAGNOSIS — R32 Unspecified urinary incontinence: Secondary | ICD-10-CM | POA: Diagnosis present

## 2022-08-16 DIAGNOSIS — K649 Unspecified hemorrhoids: Secondary | ICD-10-CM | POA: Diagnosis present

## 2022-08-16 DIAGNOSIS — R338 Other retention of urine: Secondary | ICD-10-CM | POA: Diagnosis present

## 2022-08-16 DIAGNOSIS — E1122 Type 2 diabetes mellitus with diabetic chronic kidney disease: Secondary | ICD-10-CM | POA: Diagnosis present

## 2022-08-16 DIAGNOSIS — M25552 Pain in left hip: Secondary | ICD-10-CM | POA: Diagnosis not present

## 2022-08-16 DIAGNOSIS — Z87442 Personal history of urinary calculi: Secondary | ICD-10-CM

## 2022-08-16 DIAGNOSIS — I130 Hypertensive heart and chronic kidney disease with heart failure and stage 1 through stage 4 chronic kidney disease, or unspecified chronic kidney disease: Secondary | ICD-10-CM | POA: Diagnosis present

## 2022-08-16 DIAGNOSIS — F54 Psychological and behavioral factors associated with disorders or diseases classified elsewhere: Secondary | ICD-10-CM | POA: Diagnosis not present

## 2022-08-16 DIAGNOSIS — Z6830 Body mass index (BMI) 30.0-30.9, adult: Secondary | ICD-10-CM

## 2022-08-16 DIAGNOSIS — S72002A Fracture of unspecified part of neck of left femur, initial encounter for closed fracture: Secondary | ICD-10-CM | POA: Diagnosis not present

## 2022-08-16 DIAGNOSIS — Z955 Presence of coronary angioplasty implant and graft: Secondary | ICD-10-CM

## 2022-08-16 DIAGNOSIS — L920 Granuloma annulare: Secondary | ICD-10-CM | POA: Diagnosis present

## 2022-08-16 DIAGNOSIS — Z8673 Personal history of transient ischemic attack (TIA), and cerebral infarction without residual deficits: Secondary | ICD-10-CM

## 2022-08-16 LAB — CBC WITH DIFFERENTIAL/PLATELET
Abs Immature Granulocytes: 0.02 10*3/uL (ref 0.00–0.07)
Basophils Absolute: 0.1 10*3/uL (ref 0.0–0.1)
Basophils Relative: 1 %
Eosinophils Absolute: 0.5 10*3/uL (ref 0.0–0.5)
Eosinophils Relative: 8 %
HCT: 27 % — ABNORMAL LOW (ref 39.0–52.0)
Hemoglobin: 9 g/dL — ABNORMAL LOW (ref 13.0–17.0)
Immature Granulocytes: 0 %
Lymphocytes Relative: 27 %
Lymphs Abs: 1.7 10*3/uL (ref 0.7–4.0)
MCH: 29.5 pg (ref 26.0–34.0)
MCHC: 33.3 g/dL (ref 30.0–36.0)
MCV: 88.5 fL (ref 80.0–100.0)
Monocytes Absolute: 0.6 10*3/uL (ref 0.1–1.0)
Monocytes Relative: 10 %
Neutro Abs: 3.3 10*3/uL (ref 1.7–7.7)
Neutrophils Relative %: 54 %
Platelets: 195 10*3/uL (ref 150–400)
RBC: 3.05 MIL/uL — ABNORMAL LOW (ref 4.22–5.81)
RDW: 14 % (ref 11.5–15.5)
WBC: 6.2 10*3/uL (ref 4.0–10.5)
nRBC: 0 % (ref 0.0–0.2)

## 2022-08-16 LAB — BASIC METABOLIC PANEL
Anion gap: 7 (ref 5–15)
BUN: 53 mg/dL — ABNORMAL HIGH (ref 8–23)
CO2: 24 mmol/L (ref 22–32)
Calcium: 8.5 mg/dL — ABNORMAL LOW (ref 8.9–10.3)
Chloride: 103 mmol/L (ref 98–111)
Creatinine, Ser: 2.3 mg/dL — ABNORMAL HIGH (ref 0.61–1.24)
GFR, Estimated: 27 mL/min — ABNORMAL LOW (ref >60)
Glucose, Bld: 161 mg/dL — ABNORMAL HIGH (ref 70–99)
Potassium: 4.3 mmol/L (ref 3.5–5.1)
Sodium: 134 mmol/L — ABNORMAL LOW (ref 135–145)

## 2022-08-16 LAB — GLUCOSE, CAPILLARY
Glucose-Capillary: 159 mg/dL — ABNORMAL HIGH (ref 70–99)
Glucose-Capillary: 167 mg/dL — ABNORMAL HIGH (ref 70–99)
Glucose-Capillary: 200 mg/dL — ABNORMAL HIGH (ref 70–99)
Glucose-Capillary: 194 mg/dL — ABNORMAL HIGH (ref 70–99)

## 2022-08-16 LAB — MAGNESIUM: Magnesium: 1.8 mg/dL (ref 1.7–2.4)

## 2022-08-16 MED ORDER — AMLODIPINE BESYLATE 2.5 MG PO TABS
2.5000 mg | ORAL_TABLET | Freq: Every day | ORAL | Status: DC
  Administered 2022-08-17 – 2022-08-19 (×3): 2.5 mg via ORAL
  Filled 2022-08-16 (×3): qty 1

## 2022-08-16 MED ORDER — GABAPENTIN 300 MG PO CAPS
300.0000 mg | ORAL_CAPSULE | Freq: Two times a day (BID) | ORAL | Status: DC
  Administered 2022-08-16: 300 mg via ORAL
  Filled 2022-08-16: qty 1

## 2022-08-16 MED ORDER — CARVEDILOL 3.125 MG PO TABS
3.1250 mg | ORAL_TABLET | Freq: Two times a day (BID) | ORAL | Status: DC
  Administered 2022-08-17 – 2022-08-18 (×3): 3.125 mg via ORAL
  Filled 2022-08-16 (×3): qty 1

## 2022-08-16 MED ORDER — PRIMIDONE 50 MG PO TABS
50.0000 mg | ORAL_TABLET | Freq: Every day | ORAL | Status: DC
  Administered 2022-08-16 – 2022-08-30 (×15): 50 mg via ORAL
  Filled 2022-08-16 (×16): qty 1

## 2022-08-16 MED ORDER — CALCIUM CARBONATE ANTACID 500 MG PO CHEW
2.0000 | CHEWABLE_TABLET | Freq: Three times a day (TID) | ORAL | Status: DC | PRN
  Administered 2022-08-17 – 2022-08-19 (×2): 400 mg via ORAL
  Filled 2022-08-16 (×3): qty 2

## 2022-08-16 MED ORDER — ISOSORBIDE MONONITRATE ER 30 MG PO TB24
120.0000 mg | ORAL_TABLET | Freq: Every day | ORAL | Status: DC
  Administered 2022-08-17 – 2022-08-31 (×15): 120 mg via ORAL
  Filled 2022-08-16 (×16): qty 4

## 2022-08-16 MED ORDER — ACETAMINOPHEN 325 MG PO TABS
650.0000 mg | ORAL_TABLET | Freq: Four times a day (QID) | ORAL | Status: DC
  Administered 2022-08-16 (×2): 650 mg via ORAL
  Filled 2022-08-16 (×2): qty 2

## 2022-08-16 MED ORDER — PANTOPRAZOLE SODIUM 40 MG PO TBEC
40.0000 mg | DELAYED_RELEASE_TABLET | Freq: Every day | ORAL | Status: DC
  Administered 2022-08-17 – 2022-08-31 (×15): 40 mg via ORAL
  Filled 2022-08-16 (×15): qty 1

## 2022-08-16 MED ORDER — OXYCODONE HCL 5 MG PO TABS
5.0000 mg | ORAL_TABLET | ORAL | Status: DC | PRN
  Administered 2022-08-16: 10 mg via ORAL
  Filled 2022-08-16: qty 2

## 2022-08-16 MED ORDER — ASPIRIN 81 MG PO TBEC
81.0000 mg | DELAYED_RELEASE_TABLET | Freq: Every day | ORAL | Status: DC
  Administered 2022-08-17 – 2022-08-31 (×15): 81 mg via ORAL
  Filled 2022-08-16 (×15): qty 1

## 2022-08-16 MED ORDER — ACETAMINOPHEN 325 MG PO TABS
325.0000 mg | ORAL_TABLET | ORAL | Status: DC | PRN
  Administered 2022-08-17 – 2022-08-31 (×13): 650 mg via ORAL
  Filled 2022-08-16 (×16): qty 2

## 2022-08-16 MED ORDER — AMLODIPINE BESYLATE 5 MG PO TABS: 5.0000 mg | ORAL_TABLET | Freq: Every day | ORAL | Status: DC

## 2022-08-16 MED ORDER — CLOPIDOGREL BISULFATE 75 MG PO TABS
75.0000 mg | ORAL_TABLET | Freq: Every day | ORAL | Status: DC
  Administered 2022-08-16 – 2022-08-31 (×16): 75 mg via ORAL
  Filled 2022-08-16 (×16): qty 1

## 2022-08-16 MED ORDER — NITROGLYCERIN 0.4 MG SL SUBL
0.4000 mg | SUBLINGUAL_TABLET | SUBLINGUAL | Status: DC | PRN
  Administered 2022-08-25 – 2022-08-26 (×2): 0.4 mg via SUBLINGUAL
  Filled 2022-08-16 (×4): qty 1

## 2022-08-16 MED ORDER — SUCRALFATE 1 GM/10ML PO SUSP
1.0000 g | Freq: Four times a day (QID) | ORAL | Status: DC
  Administered 2022-08-17 – 2022-08-31 (×56): 1 g via ORAL
  Filled 2022-08-16 (×61): qty 10

## 2022-08-16 MED ORDER — BISACODYL 5 MG PO TBEC
5.0000 mg | DELAYED_RELEASE_TABLET | Freq: Every day | ORAL | Status: DC | PRN
  Administered 2022-08-17: 5 mg via ORAL
  Filled 2022-08-16: qty 1

## 2022-08-16 MED ORDER — ENOXAPARIN SODIUM 30 MG/0.3ML IJ SOSY: 30.0000 mg | PREFILLED_SYRINGE | INTRAMUSCULAR | Status: DC

## 2022-08-16 MED ORDER — INSULIN ASPART 100 UNIT/ML IJ SOLN
0.0000 [IU] | Freq: Three times a day (TID) | INTRAMUSCULAR | Status: DC
  Administered 2022-08-17 (×2): 2 [IU] via SUBCUTANEOUS
  Administered 2022-08-17 – 2022-08-18 (×2): 1 [IU] via SUBCUTANEOUS
  Administered 2022-08-18: 2 [IU] via SUBCUTANEOUS
  Administered 2022-08-18 – 2022-08-19 (×2): 1 [IU] via SUBCUTANEOUS
  Administered 2022-08-19: 3 [IU] via SUBCUTANEOUS
  Administered 2022-08-19: 2 [IU] via SUBCUTANEOUS
  Administered 2022-08-20: 3 [IU] via SUBCUTANEOUS
  Administered 2022-08-20: 2 [IU] via SUBCUTANEOUS
  Administered 2022-08-20: 1 [IU] via SUBCUTANEOUS
  Administered 2022-08-21 (×3): 2 [IU] via SUBCUTANEOUS
  Administered 2022-08-22: 1 [IU] via SUBCUTANEOUS
  Administered 2022-08-22: 2 [IU] via SUBCUTANEOUS
  Administered 2022-08-22 – 2022-08-23 (×3): 1 [IU] via SUBCUTANEOUS
  Administered 2022-08-23 – 2022-08-24 (×2): 2 [IU] via SUBCUTANEOUS
  Administered 2022-08-24: 1 [IU] via SUBCUTANEOUS
  Administered 2022-08-24: 2 [IU] via SUBCUTANEOUS
  Administered 2022-08-25 (×2): 1 [IU] via SUBCUTANEOUS
  Administered 2022-08-25: 2 [IU] via SUBCUTANEOUS
  Administered 2022-08-26: 1 [IU] via SUBCUTANEOUS
  Administered 2022-08-26: 2 [IU] via SUBCUTANEOUS
  Administered 2022-08-26: 1 [IU] via SUBCUTANEOUS
  Administered 2022-08-27: 3 [IU] via SUBCUTANEOUS
  Administered 2022-08-27 (×2): 1 [IU] via SUBCUTANEOUS
  Administered 2022-08-28: 2 [IU] via SUBCUTANEOUS
  Administered 2022-08-28 – 2022-08-29 (×3): 1 [IU] via SUBCUTANEOUS
  Administered 2022-08-29: 2 [IU] via SUBCUTANEOUS
  Administered 2022-08-29: 1 [IU] via SUBCUTANEOUS
  Administered 2022-08-30: 2 [IU] via SUBCUTANEOUS
  Administered 2022-08-31: 1 [IU] via SUBCUTANEOUS

## 2022-08-16 MED ORDER — POLYETHYLENE GLYCOL 3350 17 G PO PACK
34.0000 g | PACK | Freq: Every day | ORAL | Status: DC
  Administered 2022-08-17 – 2022-08-22 (×4): 34 g via ORAL
  Filled 2022-08-16 (×5): qty 2

## 2022-08-16 MED ORDER — CAMPHOR-MENTHOL 0.5-0.5 % EX LOTN: TOPICAL_LOTION | CUTANEOUS | Status: DC | PRN

## 2022-08-16 MED ORDER — ALLOPURINOL 100 MG PO TABS
100.0000 mg | ORAL_TABLET | Freq: Every day | ORAL | Status: DC
  Administered 2022-08-17 – 2022-08-31 (×15): 100 mg via ORAL
  Filled 2022-08-16 (×15): qty 1

## 2022-08-16 MED ORDER — LIDOCAINE 5 % EX PTCH
1.0000 | MEDICATED_PATCH | CUTANEOUS | Status: DC
  Administered 2022-08-24 – 2022-08-29 (×6): 1 via TRANSDERMAL
  Filled 2022-08-16 (×11): qty 1

## 2022-08-16 MED ORDER — ENOXAPARIN SODIUM 30 MG/0.3ML IJ SOSY
30.0000 mg | PREFILLED_SYRINGE | INTRAMUSCULAR | Status: DC
  Administered 2022-08-17 – 2022-08-26 (×10): 30 mg via SUBCUTANEOUS
  Filled 2022-08-16 (×10): qty 0.3

## 2022-08-16 MED ORDER — ALUM & MAG HYDROXIDE-SIMETH 200-200-20 MG/5ML PO SUSP
30.0000 mL | ORAL | Status: DC | PRN
  Administered 2022-08-17 – 2022-08-21 (×6): 30 mL via ORAL
  Filled 2022-08-16 (×7): qty 30

## 2022-08-16 MED ORDER — FERROUS SULFATE 325 (65 FE) MG PO TABS
325.0000 mg | ORAL_TABLET | Freq: Every day | ORAL | Status: DC
  Administered 2022-08-16 – 2022-08-31 (×16): 325 mg via ORAL
  Filled 2022-08-16 (×16): qty 1

## 2022-08-16 MED ORDER — GUAIFENESIN 200 MG PO TABS
400.0000 mg | ORAL_TABLET | Freq: Every day | ORAL | Status: DC
  Administered 2022-08-16 – 2022-08-24 (×9): 400 mg via ORAL
  Filled 2022-08-16 (×9): qty 2

## 2022-08-16 MED ORDER — OXYCODONE HCL 5 MG PO TABS
5.0000 mg | ORAL_TABLET | ORAL | Status: DC | PRN
  Administered 2022-08-16: 5 mg via ORAL
  Administered 2022-08-17 (×3): 10 mg via ORAL
  Administered 2022-08-18: 5 mg via ORAL
  Administered 2022-08-18 (×2): 10 mg via ORAL
  Administered 2022-08-19 (×2): 5 mg via ORAL
  Administered 2022-08-20 – 2022-08-21 (×7): 10 mg via ORAL
  Administered 2022-08-22: 5 mg via ORAL
  Administered 2022-08-22 – 2022-08-24 (×10): 10 mg via ORAL
  Administered 2022-08-25: 5 mg via ORAL
  Administered 2022-08-25 – 2022-08-30 (×10): 10 mg via ORAL
  Filled 2022-08-16 (×5): qty 2
  Filled 2022-08-16: qty 1
  Filled 2022-08-16 (×16): qty 2
  Filled 2022-08-16: qty 1
  Filled 2022-08-16 (×5): qty 2
  Filled 2022-08-16 (×2): qty 1
  Filled 2022-08-16 (×8): qty 2
  Filled 2022-08-16: qty 1
  Filled 2022-08-16 (×4): qty 2

## 2022-08-16 MED ORDER — SENNOSIDES-DOCUSATE SODIUM 8.6-50 MG PO TABS
1.0000 | ORAL_TABLET | Freq: Every day | ORAL | Status: DC
  Administered 2022-08-16 – 2022-08-25 (×7): 1 via ORAL
  Filled 2022-08-16 (×10): qty 1

## 2022-08-16 MED ORDER — TRAZODONE HCL 50 MG PO TABS
50.0000 mg | ORAL_TABLET | Freq: Every evening | ORAL | Status: DC | PRN
  Administered 2022-08-16 – 2022-08-30 (×14): 50 mg via ORAL
  Filled 2022-08-16 (×15): qty 1

## 2022-08-16 MED ORDER — ATORVASTATIN CALCIUM 80 MG PO TABS
80.0000 mg | ORAL_TABLET | Freq: Every day | ORAL | Status: DC
  Administered 2022-08-17 – 2022-08-30 (×14): 80 mg via ORAL
  Filled 2022-08-16 (×14): qty 1

## 2022-08-16 MED ORDER — ALBUTEROL SULFATE (2.5 MG/3ML) 0.083% IN NEBU
2.5000 mg | INHALATION_SOLUTION | Freq: Four times a day (QID) | RESPIRATORY_TRACT | Status: DC | PRN
  Administered 2022-08-20 – 2022-08-25 (×3): 2.5 mg via RESPIRATORY_TRACT
  Filled 2022-08-16 (×4): qty 3

## 2022-08-16 MED ORDER — METHOCARBAMOL 500 MG PO TABS
500.0000 mg | ORAL_TABLET | Freq: Four times a day (QID) | ORAL | Status: DC | PRN
  Administered 2022-08-17 – 2022-08-30 (×7): 500 mg via ORAL
  Filled 2022-08-16 (×10): qty 1

## 2022-08-16 MED ORDER — FINASTERIDE 5 MG PO TABS
5.0000 mg | ORAL_TABLET | Freq: Every day | ORAL | Status: DC
  Administered 2022-08-17 – 2022-08-31 (×15): 5 mg via ORAL
  Filled 2022-08-16 (×15): qty 1

## 2022-08-16 NOTE — Discharge Summary (Signed)
Physician Discharge Summary   GIOVANIE SCHRACK ZOX:[id number] DOB: 1934-07-14 DOA: 08/10/2022  PCP: Daisy Floro, MD  Admit date: 08/10/2022 Discharge date: 08/16/2022  Admitted From: Home Disposition:  CIR Discharging physician: Lewie Chamber, MD Barriers to discharge: awaiting [facility] insurance approval  Recommendations at discharge: Follow up with ortho, Dr. Blanchie Dessert   Discharge Condition: stable CODE STATUS: Full Diet recommendation:  Diet Orders (From admission, onward)     Start     Ordered   08/11/22 1640  Diet regular Room service appropriate? Yes; Fluid consistency: Thin  Diet effective now       Question Answer Comment  Room service appropriate? Yes   Fluid consistency: Thin      08/11/22 1639            Hospital Course: Mr. Opdyke is an 88 year old male with PMH CAD, CHF, CKD4, DMII, COPD, and GERD who presented after a mechanical fall.  He was found to have an acute intertrochanteric fracture of the left proximal femur.  He was admitted for orthopedic evaluation and cardiology consult for clearance. He underwent IM nail fixation with orthopedic surgery on 08/11/2022.  Assessment and Plan:  Left intertrochanteric femoral fracture -S/p intertrochanteric nailing on 08/11/22 - continue working with PT/mobility - d/c to CIR  Left ischial tuberosity pain -Continue lidocaine patch to the area -Continue to optimize pain control   History of CAD -Stable.  No chest pain. -Continue aspirin, statin, and beta-blocker   ABLA - baseline Hgb ~15 g/dL - expected postop drop and should stabilize and slowly rise - vitals stable; no signs/symptoms of active bleed -Hemoglobin trend is reassuring and not rapidly dropping to suggest underlying bleed.  No further workup indicated at this time   CKD stage 4 - Baseline creat ~ 2.5, eGFR~ 25 - previously 3b but appears to have progressed to stage IV at this point as has remained in Stage 4 numbers - creat stable; 2.3 today  with GFR 27, consistent with Stage IV   DMII -SSI for now   Constipation - continue laxatives   COPD -Stable   GERD -Poorly controlled, continue PPI and sucralfate - add maalox   Principal Diagnosis: Hip fracture [facility])  Discharge Diagnoses: Active Hospital Problems   Diagnosis Date Noted   Hip fracture (HCC) 08/10/2022   Left displaced femoral neck fracture (HCC) 08/10/2022    Priority: 1.   ABLA (acute blood loss anemia) 08/16/2022    Priority: 2.   CAD S/P percutaneous coronary angioplasty 08/10/2022   DMII (diabetes mellitus, type 2) (HCC) 09/01/2020   Diabetic peripheral neuropathy associated with type 2 diabetes mellitus (HCC) 09/01/2020   Essential hypertension 05/29/2013   CKD (chronic kidney disease) stage 4, GFR 15-29 ml/min (HCC) 09/13/2012    Class: Chronic    Resolved Hospital Problems   Diagnosis Date Noted Date Resolved   Fall 08/11/2022 08/16/2022      Allergies as of 08/16/2022       Reactions   Cimetidine Other (See Comments)   Anxiety Attacks   Metformin Hcl    Other reaction(s): decreased kidney function        Medication List     ASK your doctor about these medications    albuterol 108 (90 Base) MCG/ACT inhaler Commonly known as: VENTOLIN HFA Inhale 2 puffs into the lungs every 6 (six) hours as needed for wheezing or shortness of breath.   albuterol (2.5 MG/3ML) 0.083% nebulizer solution Commonly known as: PROVENTIL Take 2.5 mg by nebulization every 6 (  six) hours as needed for wheezing or shortness of breath.   allopurinol 100 MG tablet Commonly known as: ZYLOPRIM Take 100 mg by mouth daily.   amLODipine 5 MG tablet Commonly known as: NORVASC Take 5 mg by mouth daily.   aspirin EC 81 MG tablet Take 1 tablet (81 mg total) by mouth daily.   atorvastatin 80 MG tablet Commonly known as: LIPITOR Take 1 tablet (80 mg total) by mouth daily at 6 PM.   capsaicin 0.025 % cream Commonly known as: ZOSTRIX Apply topically 2 (two)  times daily. Ask about: Which instructions should I use?   carvedilol 3.125 MG tablet Commonly known as: COREG Take 3.125 mg by mouth 2 (two) times daily with a meal.   cholecalciferol 25 MCG (1000 UNIT) tablet Commonly known as: VITAMIN D3 Take 1,000 Units by mouth daily.   clopidogrel 75 MG tablet Commonly known as: PLAVIX Take 1 tablet (75 mg total) by mouth daily.   cyanocobalamin 1000 MCG tablet Commonly known as: VITAMIN B12 Take 1,000 mcg by mouth daily.   dapagliflozin propanediol 10 MG Tabs tablet Commonly known as: Farxiga Take 1 tablet (10 mg total) by mouth daily before breakfast.   docusate sodium 100 MG capsule Commonly known as: COLACE Take 100 mg by mouth 2 (two) times daily.   finasteride 5 MG tablet Commonly known as: PROSCAR Take 1 tablet (5 mg total) by mouth daily.   gabapentin 300 MG capsule Commonly known as: NEURONTIN Take 1 capsule (300 mg total) by mouth at bedtime.   glipiZIDE 5 MG tablet Commonly known as: GLUCOTROL Take 5 mg by mouth 2 (two) times daily before a meal.   guaifenesin 400 MG Tabs tablet Commonly known as: HUMIBID E Take 400 mg by mouth at bedtime.   Iron 325 (65 Fe) MG Tabs Take 325 mg by mouth daily.   isosorbide mononitrate 120 MG 24 hr tablet Commonly known as: IMDUR Take 1 tablet (120 mg total) by mouth daily.   losartan 25 MG tablet Commonly known as: COZAAR Take 0.5 mg by mouth 2 (two) times daily.   nitroGLYCERIN 0.4 MG SL tablet Commonly known as: NITROSTAT Place 0.4 mg under the tongue every 5 (five) minutes as needed for chest pain.   pantoprazole 40 MG tablet Commonly known as: PROTONIX Take 40 mg by mouth daily.   primidone 50 MG tablet Commonly known as: MYSOLINE Take 50 mg by mouth at bedtime.   sucralfate 1 GM/10ML suspension Commonly known as: CARAFATE Take 1 g by mouth 2 (two) times daily.   traMADol 50 MG tablet Commonly known as: ULTRAM Take 1 tablet (50 mg total) by mouth at bedtime  as needed for moderate pain.        Follow-up Information     Joen Laura, MD Follow up in 2 week(s).   Specialty: Orthopedic Surgery Contact information: [address] [place] [address] [phone]                Allergies  Allergen Reactions   Cimetidine Other (See Comments)    Anxiety Attacks   Metformin Hcl     Other reaction(s): decreased kidney function    Consultations: Ortho  Procedures: 08/11/22: Left IM nail fixation  Discharge Exam: BP (!) 144/58 (BP Location: Right Arm)   Pulse 72   Temp 98 F (36.7 C)   Resp 18   Ht 5\' 8"  (1.727 m)   Wt 87 kg   SpO2 90%  BMI 29.16 kg/m  Physical Exam Constitutional:      General: He is not in acute distress.    Appearance: Normal appearance.  HENT:     Head: Normocephalic and atraumatic.     Mouth/Throat:     Mouth: Mucous membranes are moist.  Eyes:     Extraocular Movements: Extraocular movements intact.  Cardiovascular:     Rate and Rhythm: Normal rate and regular rhythm.  Pulmonary:     Effort: Pulmonary effort is normal. No respiratory distress.     Breath sounds: Normal breath sounds. No wheezing.  Abdominal:     General: Bowel sounds are normal. There is no distension.     Palpations: Abdomen is soft.     Tenderness: There is no abdominal tenderness.  Musculoskeletal:        General: Normal range of motion.     Cervical back: Normal range of motion and neck supple.     Comments: Left surgical dressing in place; soft compartments; mild bruising noted in upper thigh  Skin:    General: Skin is warm and dry.  Neurological:     General: No focal deficit present.     Mental Status: He is alert.  Psychiatric:        Mood and Affect: Mood normal.        Behavior: Behavior normal.      The results of significant diagnostics from this hospitalization (including imaging, microbiology, ancillary and laboratory) are listed below for reference.   Microbiology: Recent  Results (from the past 240 hour(s))  Surgical pcr screen     Status: None   Collection Time: 08/11/22  6:51 AM   Specimen: Nasal Mucosa; Nasal Swab  Result Value Ref Range Status   MRSA, PCR NEGATIVE NEGATIVE Final   Staphylococcus aureus NEGATIVE NEGATIVE Final    Comment: (NOTE) The Xpert SA Assay (FDA approved for NASAL specimens in patients 78 years of age and older), is one component of a comprehensive surveillance program. It is not intended to diagnose infection nor to guide or monitor treatment. Performed at [facility], [address], [place], [place] [address]      Labs: BNP (last 3 results) No results for input(s): "BNP" in the last 8760 hours. Basic Metabolic Panel: Recent Labs  Lab 08/10/22 1340 08/10/22 1441 08/12/22 0229 08/14/22 0055 08/16/22 0207  NA 137 140 134* 134* 134*  K 4.5 4.5 4.9 4.7 4.3  CL 105 106 101 103 103  CO2 22  --  24 22 24   GLUCOSE 113* 110* 181* 127* 161*  BUN 54* 51* 56* 59* 53*  CREATININE 2.58* 2.90* 2.79* 2.95* 2.30*  CALCIUM 9.3  --  8.4* 8.2* 8.5*  MG  --   --  1.6* 1.3* 1.8  PHOS  --   --  4.6 4.3  --    Liver Function Tests: Recent Labs  Lab 08/12/22 0229 08/14/22 0055  ALBUMIN 3.2* 3.0*   No results for input(s): "LIPASE", "AMYLASE" in the last 168 hours. No results for input(s): "AMMONIA" in the last 168 hours. CBC: Recent Labs  Lab 08/10/22 1340 08/10/22 1441 08/12/22 0229 08/14/22 0055 08/16/22 0207  WBC 7.8  --  9.4 8.0 6.2  NEUTROABS 5.7  --  7.3 4.8 3.3  HGB 15.0 15.3 10.1* 9.4* 9.0*  HCT 45.0 45.0 29.8* 28.8* 27.0*  MCV 87.9  --  88.4 92.3 88.5  PLT 244  --  197 169 195   Cardiac Enzymes: No results  for input(s): "CKTOTAL", "CKMB", "CKMBINDEX", "TROPONINI" in the last 168 hours. BNP: Invalid input(s): "POCBNP" CBG: Recent Labs  Lab 08/15/22 1204 08/15/22 1615 08/15/22 1920 08/16/22 0817 08/16/22 1140  GLUCAP 155* 158* 221* 159* 167*   D-Dimer No results for input(s): "DDIMER" in  the last 72 hours. Hgb A1c No results for input(s): "HGBA1C" in the last 72 hours. Lipid Profile No results for input(s): "CHOL", "HDL", "LDLCALC", "TRIG", "CHOLHDL", "LDLDIRECT" in the last 72 hours. Thyroid function studies No results for input(s): "TSH", "T4TOTAL", "T3FREE", "THYROIDAB" in the last 72 hours.  Invalid input(s): "FREET3" Anemia work up No results for input(s): "VITAMINB12", "FOLATE", "FERRITIN", "TIBC", "IRON", "RETICCTPCT" in the last 72 hours. Urinalysis    Component Value Date/Time   COLORURINE YELLOW (A) 10/19/2017 1008   APPEARANCEUR CLEAR (A) 10/19/2017 1008   LABSPEC 1.025 10/19/2017 1008   PHURINE 5.0 10/19/2017 1008   GLUCOSEU NEGATIVE 10/19/2017 1008   HGBUR NEGATIVE 10/19/2017 1008   BILIRUBINUR NEGATIVE 10/19/2017 1008   KETONESUR NEGATIVE 10/19/2017 1008   PROTEINUR 30 (A) 10/19/2017 1008   NITRITE NEGATIVE 10/19/2017 1008   LEUKOCYTESUR NEGATIVE 10/19/2017 1008   Sepsis Labs Recent Labs  Lab 08/10/22 1340 08/12/22 0229 08/14/22 0055 08/16/22 0207  WBC 7.8 9.4 8.0 6.2   Microbiology Recent Results (from the past 240 hour(s))  Surgical pcr screen     Status: None   Collection Time: 08/11/22  6:51 AM   Specimen: Nasal Mucosa; Nasal Swab  Result Value Ref Range Status   MRSA, PCR NEGATIVE NEGATIVE Final   Staphylococcus aureus NEGATIVE NEGATIVE Final    Comment: (NOTE) The Xpert SA Assay (FDA approved for NASAL specimens in patients 66 years of age and older), is one component of a comprehensive surveillance program. It is not intended to diagnose infection nor to guide or monitor treatment. Performed at [facility], [address], [place], [place] [address]     Procedures/Studies: CT PELVIS WO CONTRAST  Result Date: 08/15/2022 CLINICAL DATA:  Pelvic fracture. EXAM: CT PELVIS WITHOUT CONTRAST TECHNIQUE: Multidetector CT imaging of the pelvis was performed following the standard protocol without intravenous contrast. RADIATION  DOSE REDUCTION: This exam was performed according to the departmental dose-optimization program which includes automated exposure control, adjustment of the mA and/or kV according to patient size and/or use of iterative reconstruction technique. COMPARISON:  Hip radiographs dated Aug 11, 2022 FINDINGS: Urinary Tract:  No abnormality visualized. Bowel: Sigmoid colonic diverticulosis without evidence of acute diverticulitis. Bowel loops are normal in caliber. Vascular/Lymphatic: Aortic and iliac vessel atherosclerotic calcifications. Focal fusiform dilatation of the right common iliac artery measuring up to 2.3 x 2.6 cm. Reproductive: Prostate is mildly enlarged with coarse calcifications measuring up to 5.0 cm. Other: Subcutaneous emphysema in the right lower anterior abdominal wall. Subcutaneous emphysema and postsurgical changes in the left hip region for recent cephalomedullary nail placement. Musculoskeletal: Left hip cephalomedullary nail placement with intact hardware. No other appreciable fracture. Degenerate disc disease of the lumbar spine with Schmorl node in the superior endplate of L4 vertebral body. Multilevel facet joint arthropathy. Bone island in the S2 vertebral body. IMPRESSION: 1. Left hip cephalomedullary nail placement with intact hardware. No other appreciable fracture. 2. Subcutaneous emphysema in the right lower anterior abdominal wall. 3. Sigmoid colonic diverticulosis without evidence of acute diverticulitis. 4. Focal fusiform aneurysmal dilatation of the right common iliac artery measuring up to 2.3 x 2.6 cm. 5. Mild prostatomegaly with coarse calcifications. 6. Degenerate disc disease of the lumbar spine with  Schmorl node in the superior endplate of L4 vertebral body. 7. Aortic atherosclerosis. Electronically Signed   By: Larose Hires D.O.   On: 08/15/2022 10:17   DG HIP UNILAT W OR W/O PELVIS 2-3 VIEWS LEFT  Result Date: 08/11/2022 CLINICAL DATA:  [id number] Post-operative state [id number]  EXAM: DG HIP (WITH OR WITHOUT PELVIS) 2-3V LEFT COMPARISON:  08/10/2022 FINDINGS: Postsurgical changes of left proximal femur intramedullary nail for an intertrochanteric fracture. Improved fracture alignment. No evidence of immediate hardware complication. IMPRESSION: Postsurgical changes of left proximal femur ORIF. No evidence of immediate hardware complication. Electronically Signed   By: Caprice Renshaw M.D.   On: 08/11/2022 17:11   DG HIP UNILAT WITH PELVIS 2-3 VIEWS LEFT  Result Date: 08/11/2022 CLINICAL DATA:  [id number] Elective surgery [id number] EXAM: DG HIP (WITH OR WITHOUT PELVIS) 2-3V LEFT COMPARISON:  CT 08/30/2022 FINDINGS: Intraoperative images during left proximal femur intramedullary nail for an intertrochanteric fracture. Improved fracture alignment. IMPRESSION: Intraoperative images during left proximal femur ORIF. Improved fracture alignment. No evidence of immediate hardware complication. Electronically Signed   By: Caprice Renshaw M.D.   On: 08/11/2022 15:31   DG C-Arm 1-60 Min-No Report  Result Date: 08/11/2022 Fluoroscopy was utilized by the requesting physician.  No radiographic interpretation.   ECHOCARDIOGRAM COMPLETE  Result Date: 08/11/2022    ECHOCARDIOGRAM REPORT   Patient Name:   MATVIY SORANNO Date of Exam: 08/11/2022 Medical Rec #:  [id number]      Height:       68.0 in Accession #:    [id number]     Weight:       191.0 lb Date of Birth:  1934/09/09       BSA:          2.004 m Patient Age:    87 years       BP:           124/70 mmHg Patient Gender: M              HR:           80 bpm. Exam Location:  Inpatient Procedure: 2D Echo, Cardiac Doppler, Color Doppler and Intracardiac            Opacification Agent Indications:    Pre-op  History:        Patient has prior history of Echocardiogram examinations, most                 recent 10/14/2017. CAD, COPD, Aortic stenosis; Risk Factors:Former                 Smoker.  Sonographer:    Lucy Antigua Referring Phys: Laurann Montana IMPRESSIONS  1.  Technically difficult; aortic valve not well visualized but calcified with reduced cusp excursion; likely moderate AS (mean gradient 23 mmHg; DI 0.30; AVA 0.9 cm2 but likely underestimated).  2. Left ventricular ejection fraction, by estimation, is 55 to 60%. The left ventricle has normal function. The left ventricle has no regional wall motion abnormalities. Left ventricular diastolic parameters are consistent with Grade I diastolic dysfunction (impaired relaxation). Elevated left atrial pressure.  3. Right ventricular systolic function is normal. The right ventricular size is normal.  4. The mitral valve is normal in structure. No evidence of mitral valve regurgitation. No evidence of mitral stenosis.  5. The aortic valve has an indeterminant number of cusps. Aortic valve regurgitation is moderate to severe. Moderate to severe aortic valve stenosis.  6. The inferior vena cava is  normal in size with greater than 50% respiratory variability, suggesting right atrial pressure of 3 mmHg. FINDINGS  Left Ventricle: Left ventricular ejection fraction, by estimation, is 55 to 60%. The left ventricle has normal function. The left ventricle has no regional wall motion abnormalities. The left ventricular internal cavity size was normal in size. There is  no left ventricular hypertrophy. Left ventricular diastolic parameters are consistent with Grade I diastolic dysfunction (impaired relaxation). Elevated left atrial pressure. Right Ventricle: The right ventricular size is normal. No increase in right ventricular wall thickness. Right ventricular systolic function is normal. Left Atrium: Left atrial size was normal in size. Right Atrium: Right atrial size was normal in size. Pericardium: There is no evidence of pericardial effusion. Mitral Valve: The mitral valve is normal in structure. Mild mitral annular calcification. No evidence of mitral valve regurgitation. No evidence of mitral valve stenosis. Tricuspid Valve: The  tricuspid valve is normal in structure. Tricuspid valve regurgitation is trivial. No evidence of tricuspid stenosis. Aortic Valve: The aortic valve has an indeterminant number of cusps. Aortic valve regurgitation is moderate to severe. Moderate to severe aortic stenosis is present. Aortic valve mean gradient measures 23.0 mmHg. Aortic valve peak gradient measures 36.0 mmHg. Aortic valve area, by VTI measures 0.84 cm. Pulmonic Valve: The pulmonic valve was not well visualized. Pulmonic valve regurgitation is not visualized. No evidence of pulmonic stenosis. Aorta: The aortic root is normal in size and structure. Venous: The inferior vena cava is normal in size with greater than 50% respiratory variability, suggesting right atrial pressure of 3 mmHg. IAS/Shunts: No atrial level shunt detected by color flow Doppler. Additional Comments: Technically difficult; aortic valve not well visualized but calcified with reduced cusp excursion; likely moderate AS (mean gradient 23 mmHg; DI 0.30; AVA 0.9 cm2 but likely underestimated).  LEFT VENTRICLE PLAX 2D LVOT diam:     1.90 cm   Diastology LV SV:         61        LV e' medial:    51.45 cm/s LV SV Index:   30        LV E/e' medial:  1.9 LVOT Area:     2.84 cm  LV e' lateral:   5.33 cm/s                          LV E/e' lateral: 18.2  RIGHT VENTRICLE             IVC RV S prime:     15.20 cm/s  IVC diam: 2.20 cm TAPSE (M-mode): 2.6 cm LEFT ATRIUM             Index        RIGHT ATRIUM           Index LA Vol (A2C):   78.1 ml 38.97 ml/m  RA Area:     11.50 cm LA Vol (A4C):   40.2 ml 20.06 ml/m  RA Volume:   20.20 ml  10.08 ml/m LA Biplane Vol: 61.0 ml 30.44 ml/m  AORTIC VALVE AV Area (Vmax):    0.92 cm AV Area (Vmean):   0.85 cm AV Area (VTI):     0.84 cm AV Vmax:           300.00 cm/s AV Vmean:          226.000 cm/s AV VTI:            0.719 m AV Peak Grad:  36.0 mmHg AV Mean Grad:      23.0 mmHg LVOT Vmax:         97.40 cm/s LVOT Vmean:        67.600 cm/s LVOT VTI:           0.214 m LVOT/AV VTI ratio: 0.30  AORTA Ao Root diam: 2.60 cm Ao Asc diam:  2.20 cm MITRAL VALVE MV Area (PHT): 2.75 cm     SHUNTS MV Decel Time: 276 msec     Systemic VTI:  0.21 m MV E velocity: 96.90 cm/s   Systemic Diam: 1.90 cm MV A velocity: 135.00 cm/s MV E/A ratio:  0.72 Olga Millers MD Electronically signed by Olga Millers MD Signature Date/Time: 08/11/2022/9:36:04 AM    Final    DG Wrist Complete Left  Result Date: 08/10/2022 CLINICAL DATA:  Trauma, fall EXAM: LEFT WRIST - COMPLETE 3+ VIEW COMPARISON:  None Available. FINDINGS: No recent fracture or dislocation is seen. Subcortical cyst is seen in lunate. Bony spurs are seen in first carpometacarpal and first metacarpophalangeal joints. Arterial calcifications are seen in soft tissues. IMPRESSION: No recent fracture or dislocation is seen in left wrist. Electronically Signed   By: Ernie Avena M.D.   On: 08/10/2022 15:49   DG Chest Portable 1 View  Result Date: 08/10/2022 CLINICAL DATA:  Trauma, fall EXAM: PORTABLE CHEST 1 VIEW COMPARISON:  02/23/2019 FINDINGS: Cardiac size is in upper limits of normal. There are no signs of pulmonary edema or focal pulmonary consolidation. Patchy infiltrate in right lower lung field seen in the earlier CT could not be clearly identified on the radiograph. There is no pleural effusion or pneumothorax. Air soft tissue interface seen in the lateral aspect of left lower lung field may be due to skin fold. IMPRESSION: There are no signs of pulmonary edema or focal pulmonary consolidation. Electronically Signed   By: Ernie Avena M.D.   On: 08/10/2022 15:45   CT CHEST ABDOMEN PELVIS WO CONTRAST  Result Date: 08/10/2022 CLINICAL DATA:  Trauma, fall EXAM: CT CHEST, ABDOMEN AND PELVIS WITHOUT CONTRAST TECHNIQUE: Multidetector CT imaging of the chest, abdomen and pelvis was performed following the standard protocol without IV contrast. RADIATION DOSE REDUCTION: This exam was performed according to  the departmental dose-optimization program which includes automated exposure control, adjustment of the mA and/or kV according to patient size and/or use of iterative reconstruction technique. COMPARISON:  CT pelvis done on 02/12/2021, CT abdomen and pelvis done on 10/17/2012 FINDINGS: CT CHEST FINDINGS Cardiovascular: Coronary artery calcifications are seen. Scattered calcifications are seen in thoracic aorta and its major branches. Mediastinum/Nodes: No acute findings are seen. Lungs/Pleura: There are patchy alveolar infiltrates in posteromedial right lower lung field. Small linear densities are seen in left lower lung field. There is no pleural effusion or pneumothorax. Musculoskeletal: No acute findings are seen in bony structures in the thorax. CT ABDOMEN PELVIS FINDINGS Hepatobiliary: Surgical clips are seen in gallbladder fossa. There is no dilation of bile ducts. Pancreas: No focal abnormalities are seen. Spleen: Unremarkable. Adrenals/Urinary Tract: Adrenals are unremarkable. There is no hydronephrosis. There are scattered calcifications in renal artery branches. No definite renal or ureteral stones are seen. Urinary bladder is unremarkable. Stomach/Bowel: Stomach is unremarkable. Small bowel loops are not dilated. Appendix is not dilated. There is no significant wall thickening in colon. Multiple diverticula are seen in colon without signs of focal acute diverticulitis. Vascular/Lymphatic: Arterial calcifications are seen in aorta and its major branches. There is a 3 x 2.3 cm  aneurysm in the right common iliac artery. There is 2.3 x 2.2 cm aneurysm in the left common iliac artery. Reproductive: Prostate is enlarged projecting into the base of the bladder. There are scattered calcifications in prostate. Other: There is no ascites or pneumoperitoneum. Small umbilical hernia containing fat is seen. Small bilateral inguinal hernias containing fat are noted. Musculoskeletal: There is comminuted  intertrochanteric fracture in proximal left femur. There is no dislocation. Degenerative changes are noted in both hips. There is large Schmorl's node in the upper endplate of body of L4 vertebra. Degenerative changes are noted in thoracic and lumbar spine. IMPRESSION: Recent comminuted intertrochanteric fracture is seen in proximal left femur. No other definite recent fractures are seen. There is large Schmorl's node in the upper endplate of body of L4 vertebra. Degenerative changes are noted in thoracic and lumbar spine with bony spurs. Patchy alveolar infiltrate is seen in right lower lobe suggesting atelectasis/pneumonia. Small linear densities in left lower lung field may suggest scarring or subsegmental atelectasis. There is no evidence of hematoma in mediastinum and retroperitoneal region. There is no demonstrable laceration in solid organs. There is no bowel wall thickening. Coronary artery disease. Aortic atherosclerosis. Aneurysmal dilation is seen in both common iliac arteries, larger on the right side. Diverticulosis of colon without signs of focal diverticulitis. Enlarged prostate. Electronically Signed   By: Ernie Avena M.D.   On: 08/10/2022 15:23   CT Head Wo Contrast  Result Date: 08/10/2022 CLINICAL DATA:  Head trauma, moderate-severe; Neck trauma (Age >= 65y). Mechanical fall. EXAM: CT HEAD WITHOUT CONTRAST CT CERVICAL SPINE WITHOUT CONTRAST TECHNIQUE: Multidetector CT imaging of the head and cervical spine was performed following the standard protocol without intravenous contrast. Multiplanar CT image reconstructions of the cervical spine were also generated. RADIATION DOSE REDUCTION: This exam was performed according to the departmental dose-optimization program which includes automated exposure control, adjustment of the mA and/or kV according to patient size and/or use of iterative reconstruction technique. COMPARISON:  Head CT 10/17/2017. FINDINGS: CT HEAD FINDINGS Brain: No acute  hemorrhage. Unchanged moderate chronic small-vessel disease. Cortical gray-white differentiation is otherwise preserved. Prominence of the ventricles and sulci within expected range for age. No hydrocephalus or extra-axial collection. No mass effect or midline shift. Vascular: No hyperdense vessel or unexpected calcification. Skull: No calvarial fracture or suspicious bone lesion. Skull base is unremarkable. Sinuses/Orbits: Unremarkable. Other: None. CT CERVICAL SPINE FINDINGS Alignment: 3 mm degenerative anterolisthesis of C7 on T1. No traumatic malalignment. Skull base and vertebrae: No acute fracture. Normal craniocervical junction. No suspicious bone lesions. Soft tissues and spinal canal: No prevertebral fluid or swelling. No visible canal hematoma. Disc levels: Multilevel lower cervical spondylosis and ossification of the posterior longitudinal ligament, worst at C4-5, where there is at least moderate spinal canal stenosis. Upper chest: Unremarkable. Other: None. IMPRESSION: 1. No acute intracranial abnormality. 2. No acute fracture or traumatic malalignment of the cervical spine. 3. Multilevel lower cervical spondylosis and ossification of the posterior longitudinal ligament, worst at C4-5, where there is at least moderate spinal canal stenosis. Electronically Signed   By: Orvan Falconer M.D.   On: 08/10/2022 15:05   CT Cervical Spine Wo Contrast  Result Date: 08/10/2022 CLINICAL DATA:  Head trauma, moderate-severe; Neck trauma (Age >= 65y). Mechanical fall. EXAM: CT HEAD WITHOUT CONTRAST CT CERVICAL SPINE WITHOUT CONTRAST TECHNIQUE: Multidetector CT imaging of the head and cervical spine was performed following the standard protocol without intravenous contrast. Multiplanar CT image reconstructions of the cervical spine were also  generated. RADIATION DOSE REDUCTION: This exam was performed according to the departmental dose-optimization program which includes automated exposure control, adjustment of the  mA and/or kV according to patient size and/or use of iterative reconstruction technique. COMPARISON:  Head CT 10/17/2017. FINDINGS: CT HEAD FINDINGS Brain: No acute hemorrhage. Unchanged moderate chronic small-vessel disease. Cortical gray-white differentiation is otherwise preserved. Prominence of the ventricles and sulci within expected range for age. No hydrocephalus or extra-axial collection. No mass effect or midline shift. Vascular: No hyperdense vessel or unexpected calcification. Skull: No calvarial fracture or suspicious bone lesion. Skull base is unremarkable. Sinuses/Orbits: Unremarkable. Other: None. CT CERVICAL SPINE FINDINGS Alignment: 3 mm degenerative anterolisthesis of C7 on T1. No traumatic malalignment. Skull base and vertebrae: No acute fracture. Normal craniocervical junction. No suspicious bone lesions. Soft tissues and spinal canal: No prevertebral fluid or swelling. No visible canal hematoma. Disc levels: Multilevel lower cervical spondylosis and ossification of the posterior longitudinal ligament, worst at C4-5, where there is at least moderate spinal canal stenosis. Upper chest: Unremarkable. Other: None. IMPRESSION: 1. No acute intracranial abnormality. 2. No acute fracture or traumatic malalignment of the cervical spine. 3. Multilevel lower cervical spondylosis and ossification of the posterior longitudinal ligament, worst at C4-5, where there is at least moderate spinal canal stenosis. Electronically Signed   By: Orvan Falconer M.D.   On: 08/10/2022 15:05   DG Femur Min 2 Views Left  Result Date: 08/10/2022 CLINICAL DATA:  Fall, left leg pain EXAM: LEFT FEMUR 2 VIEWS COMPARISON:  Pelvic radiographs 08/10/2022 FINDINGS: Acute intertrochanteric fracture of the left proximal femur. No significant angulation. Vascular calcifications noted. IMPRESSION: 1. Acute intertrochanteric fracture of the left proximal femur. Electronically Signed   By: Gaylyn Rong M.D.   On: 08/10/2022 14:42    DG Elbow Complete Right  Result Date: 08/10/2022 CLINICAL DATA:  Fall, right elbow injury EXAM: RIGHT ELBOW - COMPLETE 3+ VIEW COMPARISON:  None Available. FINDINGS: IV tubing noted. Mild epicondylar spurring medially and laterally. Mild spurring along the coronoid process. Accounting for obliquity, no elbow joint effusion or visible fracture. Supinator fat pad unremarkable. IMPRESSION: 1. Mild degenerative findings. No fracture or joint effusion identified. Electronically Signed   By: Gaylyn Rong M.D.   On: 08/10/2022 14:40   DG Pelvis Portable  Result Date: 08/10/2022 CLINICAL DATA:  [place]  Left leg pain. EXAM: PORTABLE PELVIS 1-2 VIEWS COMPARISON:  CT pelvis 09/12/2020 FINDINGS: Mild axial articular space narrowing in both hips. Spurring and subcortical cyst formation along the left upper acetabulum. No appreciable fracture or acute bony findings. Vascular calcifications noted. IMPRESSION: 1. Mild degenerative findings in both hips. No acute findings. Electronically Signed   By: Gaylyn Rong M.D.   On: 08/10/2022 14:39     Time coordinating discharge: Over 30 minutes    Lewie Chamber, MD  [facility] 08/16/2022, 1:46 PM

## 2022-08-16 NOTE — Progress Notes (Signed)
Report called and given to nurse on Rehab unit. Pt transferred to 4 west rm 4. Left unit in wheelchair pushed by nurse tech, Lawanna Kobus. Left in stable condition.

## 2022-08-16 NOTE — Progress Notes (Signed)
Mobility Specialist Progress Note   08/16/22 1035  Mobility  Activity Ambulated with assistance in hallway  Level of Assistance Contact guard assist, steadying assist  Assistive Device Front wheel walker  Distance Ambulated (ft) 100 ft  Range of Motion/Exercises Active;All extremities  LLE Weight Bearing WBAT  Activity Response Tolerated well   Patient received in recliner eager to participate in mobility. Stood with supervising + cues for hand placement and ambulated min guard with slow steady gait. Required x1 standing and x1 seated rest breaks secondary to UE fatigue. Returned to room without complaint or incident. Was left in recliner with all needs met, call bell in reach.   [place] Ayaansh Smail, BS EXP Mobility Specialist Please contact via SecureChat or Rehab office at [phone]

## 2022-08-16 NOTE — Progress Notes (Signed)
INPATIENT REHABILITATION ADMISSION NOTE   Arrival Method: wheelchair     Mental Orientation: alert   Assessment: done  Skin:surgical incision   IV'S:   Pain:moderate pain when moving   Tubes and Drains:none   Safety Measures: done  Vital Signs:done   Height and Weight:done   Rehab Orientation:done   Family: wife and grand daughter    Notes:

## 2022-08-16 NOTE — H&P (Signed)
Physical Medicine and Rehabilitation Admission H&P   CC: Functional deficits secondary to left hip fracture  HPI: Marco Acevedo is an 88 year old male who presented to the [facility] ED on 08/10/2022 after a fall when he missed stepped on a stair and fell to his left side, Complained of severe left buttock pain and was unable to get up. X-rays showed a left hip fx and orthopedic surgery was consulted. He lives at home with his wife and does not use any assistive devices to ambulate. Trauma scan head and neck CT negative, left elbow negative for fracture or dislocation, positive left intertrochanter fracture. Cardiology consulted prior to surgical repair. 2D echo updated. Echocardiogram revealed mild to moderate aortic stenosis and regurgitation, normal ejection fraction and grade 1 diastolic dysfunction. Underwent IM nail/ORIF by Dr. Blanchie Dessert on 5/03. He is WBAT LLE. CT pelvis performed. No acute pelvic fracture. Tolerating diet. The patient requires inpatient medicine and rehabilitation evaluations and services for ongoing dysfunction secondary to left hip fracture.   Ambulating over 100 feet. He goes to the gym 2 days a week doing many strength training exercises and plays golf regularly as well. He is very motivated to return to his active lifestyle.   PMH: Significant for CAD, COPD, BPH, CKD, GERD. Review of Systems  Constitutional:  Negative for chills and fever.  HENT:  Positive for hearing loss. Negative for congestion.   Eyes:  Positive for blurred vision. Negative for double vision.  Respiratory:  Positive for sputum production. Negative for cough.   Cardiovascular:  Negative for chest pain and palpitations.  Gastrointestinal:  Positive for constipation. Negative for abdominal pain, nausea and vomiting.       Hard stool yesterday  Genitourinary:  Positive for frequency. Negative for dysuria.  Musculoskeletal:  Positive for joint pain.       Left hip pain/interrupts sleep  Skin:  Positive for  itching and rash.  Neurological:  Negative for dizziness and headaches.  Endo/Heme/Allergies:  Bruises/bleeds easily.  Psychiatric/Behavioral:  Negative for depression. The patient has insomnia.    Past Medical History:  Diagnosis Date   Anemia    low iron   Aortic stenosis    Arthritis    BPH (benign prostatic hyperplasia)    CAD (coronary artery disease)    Chronic diastolic CHF (congestive heart failure) (HCC)    CKD (chronic kidney disease), stage IV (HCC)    Claustrophobia    Complication of anesthesia    has to have head elevated to keep from strangling on post nasal drip   COPD (chronic obstructive pulmonary disease) (HCC)    Diabetes mellitus type II    type 2   GERD (gastroesophageal reflux disease)    Gout    Granuloma annulare    History of hiatal hernia    History of kidney stones    Litthotrispy   Hyperlipidemia    Hypertension    Neuropathy    Obesity    OSA (obstructive sleep apnea)    Stroke (HCC) 10/2017   Weakness right arm and leg   Past Surgical History:  Procedure Laterality Date   BLEPHAROPLASTY     CARDIAC CATHETERIZATION  [id number]   X 2 stents   CHOLECYSTECTOMY N/A 09/23/2014   Procedure: LAPAROSCOPIC CHOLECYSTECTOMY WITH INTRAOPERATIVE CHOLANGIOGRAM;  Surgeon: Chevis Pretty [name], MD;  Location: [facility] OR;  Service: General;  Laterality: N/A;   COLONOSCOPY     COLONOSCOPY WITH PROPOFOL N/A 03/22/2016   Procedure: COLONOSCOPY WITH PROPOFOL;  Surgeon: Charlott Rakes,  MD;  Location: [facility] ENDOSCOPY;  Service: Endoscopy;  Laterality: N/A;   ENDARTERECTOMY Left 10/19/2017   Procedure: ENDARTERECTOMY CAROTID LEFT;  Surgeon: Larina Earthly, MD;  Location: [facility] OR;  Service: Vascular;  Laterality: Left;   ESOPHAGOGASTRODUODENOSCOPY (EGD) WITH PROPOFOL N/A 03/22/2016   Procedure: ESOPHAGOGASTRODUODENOSCOPY (EGD) WITH PROPOFOL;  Surgeon: Charlott Rakes, MD;  Location: [facility] ENDOSCOPY;  Service: Endoscopy;  Laterality: N/A;   EYE SURGERY     both cataracts    INTRAMEDULLARY (IM) NAIL INTERTROCHANTERIC Left 08/11/2022   Procedure: INTRAMEDULLARY (IM) NAIL INTERTROCHANTERIC;  Surgeon: Joen Laura, MD;  Location: [facility] OR;  Service: Orthopedics;  Laterality: Left;   KNEE ARTHROSCOPY WITH MEDIAL MENISECTOMY Right 08/19/2013   Procedure: RIGHT KNEE ARTHROSCOPY WITH PARTIAL MEDIAL MENISECTOMY AND CHONDROPLASTY;  Surgeon: Velna Ochs, MD;  Location: [facility];  Service: Orthopedics;  Laterality: Right;   LEFT HEART CATH AND CORONARY ANGIOGRAPHY N/A 06/28/2016   Procedure: Left Heart Cath and Coronary Angiography;  Surgeon: Lyn Records, MD;  Location: [facility] INVASIVE CV LAB;  Service: Cardiovascular;  Laterality: N/A;   LEFT HEART CATH AND CORONARY ANGIOGRAPHY N/A 02/25/2019   Procedure: LEFT HEART CATH AND CORONARY ANGIOGRAPHY;  Surgeon: Lyn Records, MD;  Location: [facility] INVASIVE CV LAB;  Service: Cardiovascular;  Laterality: N/A;   LEFT HEART CATHETERIZATION WITH CORONARY ANGIOGRAM N/A 10/03/2011   Procedure: LEFT HEART CATHETERIZATION WITH CORONARY ANGIOGRAM;  Surgeon: Lesleigh Noe, MD;  Location: [facility] CATH LAB;  Service: Cardiovascular;  Laterality: N/A;   LEFT HEART CATHETERIZATION WITH CORONARY ANGIOGRAM N/A 09/12/2012   Procedure: LEFT HEART CATHETERIZATION WITH CORONARY ANGIOGRAM;  Surgeon: Lesleigh Noe, MD;  Location[facility] CATH LAB;  Service: Cardiovascular;  Laterality: N/A;   LIPOMA EXCISION     Biospy only; left parotid gland   LITHOTRIPSY     none     PERCUTANEOUS CORONARY STENT INTERVENTION (PCI-S) N/A 09/17/2012   Procedure: PERCUTANEOUS CORONARY STENT INTERVENTION (PCI-S);  Surgeon: Lesleigh Noe, MD;  Location: [facility] CATH LAB;  Service: Cardiovascular;  Laterality: N/A;   TRIGGER FINGER RELEASE Left 12/21/2015   Procedure: LEFT LONG FINGER TRIGGER RELEASE ;  Surgeon: Mack Hook, MD;  Location: [facility];  Service: Orthopedics;  Laterality: Left;   Family History  Problem Relation Age of Onset    Aortic aneurysm Father    Aneurysm Mother        brain   Cancer Brother    COPD Sister    Other Sister        health hx unknown   Other Sister        health hx unknown   Other Brother        health hx unknown   Social History:  reports that he quit smoking about 11 years ago. His smoking use included cigarettes. He has a 64.00 pack-year smoking history. He has never used smokeless tobacco. He reports current alcohol use. He reports that he does not use drugs. Allergies:  Allergies  Allergen Reactions   Cimetidine Other (See Comments)    Anxiety Attacks   Metformin Hcl     Other reaction(s): decreased kidney function   Medications Prior to Admission  Medication Sig Dispense Refill   albuterol (PROVENTIL) (2.5 MG/3ML) 0.083% nebulizer solution Take 2.5 mg by nebulization every 6 (six) hours as needed for wheezing or shortness of breath.      albuterol (VENTOLIN HFA) 108 (90 Base) MCG/ACT inhaler Inhale 2 puffs into the lungs  every 6 (six) hours as needed for wheezing or shortness of breath.      allopurinol (ZYLOPRIM) 100 MG tablet Take 100 mg by mouth daily.     amLODipine (NORVASC) 5 MG tablet Take 5 mg by mouth daily.     aspirin EC 81 MG tablet Take 1 tablet (81 mg total) by mouth daily. 90 tablet 3   atorvastatin (LIPITOR) 80 MG tablet Take 1 tablet (80 mg total) by mouth daily at 6 PM. 30 tablet 0   capsaicin (ZOSTRIX) 0.025 % cream Apply topically 2 (two) times daily.     carvedilol (COREG) 3.125 MG tablet Take 3.125 mg by mouth 2 (two) times daily with a meal.     cholecalciferol (VITAMIN D3) 25 MCG (1000 UT) tablet Take 1,000 Units by mouth daily.     clopidogrel (PLAVIX) 75 MG tablet Take 1 tablet (75 mg total) by mouth daily. 30 tablet 0   dapagliflozin propanediol (FARXIGA) 10 MG TABS tablet Take 1 tablet (10 mg total) by mouth daily before breakfast. 90 tablet 0   docusate sodium (COLACE) 100 MG capsule Take 100 mg by mouth 2 (two) times daily.     Ferrous Sulfate (IRON)  325 (65 FE) MG TABS Take 325 mg by mouth daily.      finasteride (PROSCAR) 5 MG tablet Take 1 tablet (5 mg total) by mouth daily. 30 tablet 5   gabapentin (NEURONTIN) 300 MG capsule Take 1 capsule (300 mg total) by mouth at bedtime. 90 capsule 1   glipiZIDE (GLUCOTROL) 5 MG tablet Take 5 mg by mouth 2 (two) times daily before a meal.      guaifenesin (HUMIBID E) 400 MG TABS tablet Take 400 mg by mouth at bedtime.     isosorbide mononitrate (IMDUR) 120 MG 24 hr tablet Take 1 tablet (120 mg total) by mouth daily. 90 tablet 3   losartan (COZAAR) 25 MG tablet Take 0.5 mg by mouth 2 (two) times daily.     nitroGLYCERIN (NITROSTAT) 0.4 MG SL tablet Place 0.4 mg under the tongue every 5 (five) minutes as needed for chest pain.     pantoprazole (PROTONIX) 40 MG tablet Take 40 mg by mouth daily.     primidone (MYSOLINE) 50 MG tablet Take 50 mg by mouth at bedtime.      sucralfate (CARAFATE) 1 GM/10ML suspension Take 1 g by mouth 2 (two) times daily.     traMADol (ULTRAM) 50 MG tablet Take 1 tablet (50 mg total) by mouth at bedtime as needed for moderate pain. (Patient taking differently: Take 50 mg by mouth 2 (two) times daily. Take 1 tablet by mouth at 6pm, and 1 at bedtime.)     vitamin B-12 (CYANOCOBALAMIN) 1000 MCG tablet Take 1,000 mcg by mouth daily.     Home: Home Living Family/patient expects to be discharged to:: Private residence Living Arrangements: Spouse/significant other Available Help at Discharge: Friend(s), Available 24 hours/day Type of Home: Other(Comment) Home Access: Stairs to enter [employer] of Steps: 1 Entrance Stairs-Rails: None Home Layout: One level Bathroom Shower/Tub: Health visitor: Standard Bathroom Accessibility: Yes Home Equipment: Agricultural consultant (2 wheels), [employer] - quad, Information systems manager - built in, [employer] - tub/shower  Lives With: Spouse   Functional History: Prior Function Prior Level of Function : Independent/Modified  Independent Mobility Comments: was out golfing the day of injury ADLs Comments: Enjoys playing goft. Does ADLs, IADLs, and driving.   Functional Status:  Mobility: Bed Mobility Overal bed mobility:  Needs Assistance Bed Mobility: Supine to Sit Supine to sit: Mod assist General bed mobility comments: mod for final scoot OOB and then min initially to assist LLE Transfers Overall transfer level: Needs assistance Equipment used: Rolling walker (2 wheels) Transfers: Sit to/from Stand Sit to Stand: Min assist General transfer comment: min assist with pt using bed rail initially Ambulation/Gait Ambulation/Gait assistance: Min guard Gait Distance (Feet): 120 Feet Assistive device: Rolling walker (2 wheels), 1 person hand held assist Gait Pattern/deviations: Step-through pattern, Step-to pattern, Decreased stride length, Decreased weight shift to left General Gait Details: Pt was better in control of walker and did have to give him limitations as he is more motivated than aware of his limits of UE endurance and to manage LLE pain Gait velocity: reduced Gait velocity interpretation: <1.31 ft/sec, indicative of household ambulator Pre-gait activities: standing balance   ADL: ADL Overall ADL's : Needs assistance/impaired Eating/Feeding: Supervision/ safety, Set up, Sitting Grooming: Wash/dry hands, Standing, Min guard Upper Body Bathing: Set up, Supervision/ safety, Sitting Lower Body Bathing: Moderate assistance, Sit to/from stand Upper Body Dressing : Supervision/safety, Set up, Sitting Lower Body Dressing: Maximal assistance, Sit to/from stand Toilet Transfer: Minimal assistance, Ambulation, Rolling walker (2 wheels) (simulated to recliner) Functional mobility during ADLs: Minimal assistance, Rolling walker (2 wheels) General ADL Comments: Pt with decreased balance, strength, and activity tolerance.   Cognition: Cognition Overall Cognitive Status: Within Functional Limits for tasks  assessed Orientation Level: Oriented X4 Cognition Arousal/Alertness: Awake/alert Behavior During Therapy: WFL for tasks assessed/performed, Impulsive Overall Cognitive Status: Within Functional Limits for tasks assessed General Comments: initially very anxious about trying to move, reattempted after pain meds  Physical Exam: Blood pressure (!) 118/57, pulse 68, temperature 98.9 F (37.2 C), temperature source Oral, resp. rate 14, weight 88.2 kg, SpO2 94 %. Physical Exam Constitutional:      General: He is not in acute distress. HENT:     Head: Normocephalic and atraumatic.  Eyes:     Extraocular Movements: Extraocular movements intact.     Pupils: Pupils are equal, round, and reactive to light.  Cardiovascular:     Rate and Rhythm: Normal rate and regular rhythm.  Pulmonary:     Effort: Pulmonary effort is normal.     Breath sounds: Normal breath sounds.  Musculoskeletal:     Right lower leg: No edema.     Left lower leg: No edema.  Skin:    General: Skin is warm and dry.  Neurological:     General: No focal deficit present.     Mental Status: He is alert and oriented to person, place, and time.  Psychiatric:        Mood and Affect: Mood normal.        Behavior: Behavior normal. Very friendly and talkative MSK: Left lower extremity strength limited by pain.  Results for orders placed or performed during the hospital encounter of 08/16/22 (from the past 48 hour(s))  Glucose, capillary     Status: Abnormal   Collection Time: 08/16/22  8:21 PM  Result Value Ref Range   Glucose-Capillary 194 (H) 70 - 99 mg/dL    Comment: Glucose reference range applies only to samples taken after fasting for at least 8 hours.   No results found.    Blood pressure (!) 118/57, pulse 68, temperature 98.9 F (37.2 C), temperature source Oral, resp. rate 14, weight 88.2 kg, SpO2 94 %.  Medical Problem List and Plan: 1. Functional deficits secondary to left femur fracture.  -patient  may  shower  -ELOS/Goals: 7 days modI  Admit to CIR  2.  Antithrombotics: -DVT/anticoagulation:  Pharmaceutical: Lovenox 30 mg X 4 weeks  -antiplatelet therapy: aspirin 81 mg daily  3. Pain Management: Tylenol as needed  -continue gabapentin 300 mg BID  -continue Lidoderm daily  4. Mood/Behavior/Sleep: LCSW to evaluate and provide emotional support  -antipsychotic agents: n/a  5. Neuropsych/cognition: This patient is capable of making decisions on his own behalf.  6. Skin/Wound Care: Routine skin care checks   7. Fluids/Electrolytes/Nutrition: Routine Is and Os and follow-up chemistries  -carb modified diet  8: Hypertension: monitor TID and prn  -continue Norvasc 5  mg daily  -continue Coreg 3.125 mg BID  -continue Imdur 120 mg daily  9: Hyperlipidemia: continue statin  10: BPH/urinary frequency/retention: continue Proscar  11: History of gout: continue allopurinol  12: Left hip fracture s/p IM nail 5/03 Dr. Blanchie Dessert  -WBAT LLE  -change dressing as needed for soiling/if wet  13: CAD: s/p stenting X 2; stable. On statin, aspirin and BB. Restart Plavix  14: CKD stage IIIb>>IV: baseline Cr ~2.5 (home on Farxiga)  -follow-up BMP  15: COPD: stable  -continue albuterol inhaler prn phlegm  16: GERD/HH: continue Protonix and Carafate  17: OSA: not using CPAP (he is not clear regarding results of sleep study)  18: DM-2: CBGs QID and SSI (home on glipizide and Farxiga)  A1c 6.5%  19: Chronic diastolic CHF: continue meds as above  -daily weight  20: ABLA, chronic anemia: follow-up CBC  -restart PO iron  21: Pruritus/rash on back: will order Sarna   22: Tremors: continue primidone  23. Screening for vitamin D deficiency: check vitamin D level tomorrow  23. Hypotension: decrease amlodipine to 2.5mg  daily.   I have personally performed a face to face diagnostic evaluation, including, but not limited to relevant history and physical exam findings, of this patient and  developed relevant assessment and plan.  Additionally, I have reviewed and concur with the physician assistant's documentation above.  Wendi Maya, PA-C  Horton Chin, MD 08/16/2022

## 2022-08-16 NOTE — Progress Notes (Signed)
Progress Note    Marco Acevedo   AOZ:[id number]  DOB: 1934/06/02  DOA: 08/10/2022     6 PCP: Daisy Floro, MD  Initial CC: fall at home  Hospital Course: Marco Acevedo is an 88 year old male with PMH CAD, CHF, CKD4, DMII, COPD, and GERD who presented after a mechanical fall.  He was found to have an acute intertrochanteric fracture of the left proximal femur.  He was admitted for orthopedic evaluation and cardiology consult for clearance. He underwent IM nail fixation with orthopedic surgery on 08/11/2022.  Interval History:  No events overnight.  Seen walking this morning comfortably with mobility specialist in the hallway.  Pain also minimal and using less pain medication daily. Remains medically stable for discharge to rehab.  Assessment and Plan:  Left intertrochanteric femoral fracture -S/p intertrochanteric nailing on 08/11/22 - continue working with PT/mobility - medically stable for CIR -Pain regimen modified today   Left ischial tuberosity pain -Continue lidocaine patch to the area -Continue to optimize pain control   History of CAD -Stable.  No chest pain. -Continue aspirin, statin, and beta-blocker  ABLA - baseline Hgb ~15 g/dL - expected postop drop and should stabilize and slowly rise - vitals stable; no signs/symptoms of active bleed -Hemoglobin trend is reassuring and not rapidly dropping to suggest underlying bleed.  No further workup indicated at this time   CKD stage 4 - Baseline creat ~ 2.5, eGFR~ 25 - previously 3b but appears to have progressed to stage IV at this point as has remained in Stage 4 numbers - creat stable; 2.3 today with GFR 27, consistent with Stage IV   DMII -SSI for now   Constipation - continue laxatives   COPD -Stable   GERD -Poorly controlled, continue PPI and sucralfate - add maalox  Old records reviewed in assessment of this patient  Antimicrobials:   DVT prophylaxis:  enoxaparin (LOVENOX) injection 30 mg  Start: 08/11/22 1000   Code Status:   Code Status: Full Code  Mobility Assessment (last 72 hours)     Mobility Assessment     Row Name 08/16/22 0800 08/15/22 1920 08/15/22 1618 08/15/22 1500 08/15/22 0519   Does patient have an order for bedrest or is patient medically unstable -- No - Continue assessment -- No - Continue assessment No - Continue assessment   What is the highest level of mobility based on the progressive mobility assessment? Level 5 (Walks with assist in room/hall) - Balance while stepping forward/back and can walk in room with assist - Complete Level 5 (Walks with assist in room/hall) - Balance while stepping forward/back and can walk in room with assist - Complete Level 5 (Walks with assist in room/hall) - Balance while stepping forward/back and can walk in room with assist - Complete Level 5 (Walks with assist in room/hall) - Balance while stepping forward/back and can walk in room with assist - Complete Level 5 (Walks with assist in room/hall) - Balance while stepping forward/back and can walk in room with assist - Complete    Row Name 08/14/22 0845 08/13/22 2130 08/13/22 1600       Does patient have an order for bedrest or is patient medically unstable No - Continue assessment No - Continue assessment --     What is the highest level of mobility based on the progressive mobility assessment? Level 5 (Walks with assist in room/hall) - Balance while stepping forward/back and can walk in room with assist - Complete Level 5 (Walks with assist  in room/hall) - Balance while stepping forward/back and can walk in room with assist - Complete Level 5 (Walks with assist in room/hall) - Balance while stepping forward/back and can walk in room with assist - Complete              Barriers to discharge: awaiting rehab approval via insurance Disposition Plan:  Pending CIR Status is: Inpt  Objective: Blood pressure (!) 144/58, pulse 72, temperature 98 F (36.7 C), resp. rate 18,  height 5\' 8"  (1.727 m), weight 87 kg, SpO2 90 %.  Examination:  Physical Exam Constitutional:      General: He is not in acute distress.    Appearance: Normal appearance.  HENT:     Head: Normocephalic and atraumatic.     Mouth/Throat:     Mouth: Mucous membranes are moist.  Eyes:     Extraocular Movements: Extraocular movements intact.  Cardiovascular:     Rate and Rhythm: Normal rate and regular rhythm.  Pulmonary:     Effort: Pulmonary effort is normal. No respiratory distress.     Breath sounds: Normal breath sounds. No wheezing.  Abdominal:     General: Bowel sounds are normal. There is no distension.     Palpations: Abdomen is soft.     Tenderness: There is no abdominal tenderness.  Musculoskeletal:        General: Normal range of motion.     Cervical back: Normal range of motion and neck supple.     Comments: Left surgical dressing in place; soft compartments; mild bruising noted in upper thigh  Skin:    General: Skin is warm and dry.  Neurological:     General: No focal deficit present.     Mental Status: He is alert.  Psychiatric:        Mood and Affect: Mood normal.        Behavior: Behavior normal.      Consultants:  Ortho  Procedures:  08/11/22: Left IM nail fixation  Data Reviewed: Results for orders placed or performed during the hospital encounter of 08/10/22 (from the past 24 hour(s))  Glucose, capillary     Status: Abnormal   Collection Time: 08/15/22 12:04 PM  Result Value Ref Range   Glucose-Capillary 155 (H) 70 - 99 mg/dL  Glucose, capillary     Status: Abnormal   Collection Time: 08/15/22  4:15 PM  Result Value Ref Range   Glucose-Capillary 158 (H) 70 - 99 mg/dL  Glucose, capillary     Status: Abnormal   Collection Time: 08/15/22  7:20 PM  Result Value Ref Range   Glucose-Capillary 221 (H) 70 - 99 mg/dL  Basic metabolic panel     Status: Abnormal   Collection Time: 08/16/22  2:07 AM  Result Value Ref Range   Sodium 134 (L) 135 - 145 mmol/L    Potassium 4.3 3.5 - 5.1 mmol/L   Chloride 103 98 - 111 mmol/L   CO2 24 22 - 32 mmol/L   Glucose, Bld 161 (H) 70 - 99 mg/dL   BUN 53 (H) 8 - 23 mg/dL   Creatinine, Ser 1.61 (H) 0.61 - 1.24 mg/dL   Calcium 8.5 (L) 8.9 - 10.3 mg/dL   GFR, Estimated 27 (L) >60 mL/min   Anion gap 7 5 - 15  CBC with Differential/Platelet     Status: Abnormal   Collection Time: 08/16/22  2:07 AM  Result Value Ref Range   WBC 6.2 4.0 - 10.5 K/uL   RBC 3.05 (L)  4.22 - 5.81 MIL/uL   Hemoglobin 9.0 (L) 13.0 - 17.0 g/dL   HCT 16.1 (L) 09.6 - 04.5 %   MCV 88.5 80.0 - 100.0 fL   MCH 29.5 26.0 - 34.0 pg   MCHC 33.3 30.0 - 36.0 g/dL   RDW 40.9 81.1 - 91.4 %   Platelets 195 150 - 400 K/uL   nRBC 0.0 0.0 - 0.2 %   Neutrophils Relative % 54 %   Neutro Abs 3.3 1.7 - 7.7 K/uL   Lymphocytes Relative 27 %   Lymphs Abs 1.7 0.7 - 4.0 K/uL   Monocytes Relative 10 %   Monocytes Absolute 0.6 0.1 - 1.0 K/uL   Eosinophils Relative 8 %   Eosinophils Absolute 0.5 0.0 - 0.5 K/uL   Basophils Relative 1 %   Basophils Absolute 0.1 0.0 - 0.1 K/uL   Immature Granulocytes 0 %   Abs Immature Granulocytes 0.02 0.00 - 0.07 K/uL  Magnesium     Status: None   Collection Time: 08/16/22  2:07 AM  Result Value Ref Range   Magnesium 1.8 1.7 - 2.4 mg/dL  Glucose, capillary     Status: Abnormal   Collection Time: 08/16/22  8:17 AM  Result Value Ref Range   Glucose-Capillary 159 (H) 70 - 99 mg/dL    I have reviewed pertinent nursing notes, vitals, labs, and images as necessary. I have ordered labwork to follow up on as indicated.  I have reviewed the last notes from staff over past 24 hours. I have discussed patient's care plan and test results with nursing staff, CM/SW, and other staff as appropriate.    LOS: 6 days   Lewie Chamber, MD [facility] 08/16/2022, 11:08 AM

## 2022-08-16 NOTE — Progress Notes (Signed)
Inpatient Rehabilitation Admission Medication Review by a Pharmacist  A complete drug regimen review was completed for this patient to identify any potential clinically significant medication issues.  High Risk Drug Classes Is patient taking? Indication by Medication  Antipsychotic No   Anticoagulant Yes Lovenox- vte ppx  Antibiotic No   Opioid Yes oxyIR- acuter pain  Antiplatelet Yes Aspirin- Hx CAD  Hypoglycemics/insulin Yes Insulin- T2DM  Vasoactive Medication Yes Norvasc, coreg, imdur, NitroSL- HTN, angina  Chemotherapy No   Other Yes Robaxin - muscle spasms Lipitor- HLD Proscar- BPH Gabapentin- neuropathic pain Protonix- GERD Mysoline- essential tremors [facility] 04[facility]21     Type of Medication Issue Identified Description of Issue Recommendation(s)  Drug Interaction(s) (clinically significant)     Duplicate Therapy     Allergy     No Medication Administration End Date     Incorrect Dose     Additional Drug Therapy Needed     Significant med changes from prior encounter (inform family/care partners about these prior to discharge).    Other       Clinically significant medication issues were identified that warrant physician communication and completion of prescribed/recommended actions by midnight of the next day:  No    Time spent performing this drug regimen review (minutes):  30   Earle Reome BS, PharmD, BCPS Clinical Pharmacist 08/16/2022 2:25 PM  Contact: [phone] after 3 PM  "Be curious, not judgmental..." -Debbora Dus

## 2022-08-16 NOTE — Progress Notes (Signed)
Horton Chin, MD  Physician Physical Medicine and Rehabilitation   PMR Pre-admission    Signed   Date of Service: 08/13/2022  2:43 PM  Related encounter: ED to Hosp-Admission (Discharged) from 08/10/2022 in [facility]   Signed      Show:Clear all [x] Written[x] Templated[] Copied  Added by: [x] Standley Brooking, RN[x] Theodoro Kos, Lauren P, CCC-SLP[x] Raulkar, Drema Pry, MD  [] Hover for details PMR Admission Coordinator Pre-Admission Assessment   Patient: Marco Acevedo is an 88 y.o., male MRN: [id number] DOB: Mar 16, 1935 Height: 5\' 8"  (172.7 cm) Weight: 87 kg   Insurance Information HMO:     PPO:      PCP:      IPA:      80/20:      OTHER:  PRIMARY: [facility]     Policy#: [id number]      Subscriber: patient CM Name: via email form [email or web address] ( Cassie, Maryanna Shape and Sharilyn Sites)      Phone#: vis email     Fax#: via email Pre-Cert#: [id number]       Employer:  Benefits:  Phone #: [phone]     Name: 5/7 Eff. Date: 09/05/2017     Deduct: none      Out of Pocket Max: none      Life Max: none CIR: Per [facility]      S[place]: per [place]: per [place]     Co-Pay:  Home Health: per [place]      Co-Pay:  DME: per [facility]     Co-Pay:  Providers: in-network SECONDARY: [employer]      Policy#: [id number]        Financial Counselor:       Phone#:    The "Data Collection Information Summary" for patients in Inpatient Rehabilitation Facilities with attached "Privacy Act Statement-Health Care Records" was provided and verbally reviewed with: N/A   Emergency Contact Information Contact Information       Name Relation Home Work Mobile    [place] Son [phone]   [phone]    Kunkler,Doris Spouse [phone]             Current Medical History  Patient Admitting Diagnosis: L hip fracture   History of Present Illness: Pt is an 88 year old male with medical hx significant for: HTN, diabetes, CAD with  stenting x2, chronic HFpEF, CKD stage IIIb, COPD, . Pt presented to [facility] on 08/10/22 after fall.    Pt reported left hip and buttock pain. Noted skin tear to right elbow and left wrist. X-ray revealed left intertrochanteric hip fracture. CT head and C spine negative for acute abnormalities. Orthopedics consulted. Cardiology consulted for preop risk stratification. Echo showed moderate aortic stenosis, Normal LV, calcified lesions. Pt underwent IM Nail on 08/11/22 by Dr. Blanchie Dessert. Therapy evaluations completed and CIR recommended d/t pt's deficits in functional mobility, pain management, and inability to complete ADLs independently.    Patient's medical record from [facility] has been reviewed by the rehabilitation admission coordinator and physician.   Past Medical History      Past Medical History:  Diagnosis Date   Anemia      low iron   Aortic stenosis     Arthritis     BPH (benign prostatic hyperplasia)     CAD (coronary artery disease)     Chronic diastolic CHF (congestive heart failure) (HCC)     CKD (chronic kidney disease), stage IV (HCC)  Claustrophobia     Complication of anesthesia      has to have head elevated to keep from strangling on post nasal drip   COPD (chronic obstructive pulmonary disease) (HCC)     Diabetes mellitus type II      type 2   GERD (gastroesophageal reflux disease)     Gout     Granuloma annulare     History of hiatal hernia     History of kidney stones      Litthotrispy   Hyperlipidemia     Hypertension     Neuropathy     Obesity     OSA (obstructive sleep apnea)     Stroke (HCC) 10/2017    Weakness right arm and leg    Has the patient had major surgery during 100 days prior to admission? Yes   Family History   family history includes Aneurysm in his mother; Aortic aneurysm in his father; COPD in his sister; Cancer in his brother; Other in his brother, sister, and sister.   Current Medications   Current  Facility-Administered Medications:    acetaminophen (TYLENOL) tablet 650 mg, 650 mg, Oral, QID, Girguis, David, MD   albuterol (PROVENTIL) (2.5 MG/3ML) 0.083% nebulizer solution 2.5 mg, 2.5 mg, Nebulization, Q6H PRN, Joen Laura, MD   allopurinol (ZYLOPRIM) tablet 100 mg, 100 mg, Oral, Daily, Joen Laura, MD, 100 mg at 08/16/22 0850   alum & mag hydroxide-simeth (MAALOX/MYLANTA) 200-200-20 MG/5ML suspension 30 mL, 30 mL, Oral, Q4H PRN, Lewie Chamber, MD   amLODipine (NORVASC) tablet 5 mg, 5 mg, Oral, Daily, Joen Laura, MD, 5 mg at 08/16/22 1610   aspirin EC tablet 81 mg, 81 mg, Oral, Daily, Joen Laura, MD, 81 mg at 08/16/22 0851   atorvastatin (LIPITOR) tablet 80 mg, 80 mg, Oral, q1800, Joen Laura, MD, 80 mg at 08/15/22 1639   bisacodyl (DULCOLAX) EC tablet 5 mg, 5 mg, Oral, Daily PRN, Joen Laura, MD   calcium carbonate (TUMS - dosed in mg elemental calcium) chewable tablet 400 mg of elemental calcium, 2 tablet, Oral, TID PRN, Lewie Chamber, MD   carvedilol (COREG) tablet 3.125 mg, 3.125 mg, Oral, BID WC, Joen Laura, MD, 3.125 mg at 08/16/22 0851   enoxaparin (LOVENOX) injection 30 mg, 30 mg, Subcutaneous, Q24H, Joen Laura, MD, 30 mg at 08/16/22 9604   feeding supplement (ENSURE ENLIVE / ENSURE PLUS) liquid 237 mL, 237 mL, Oral, BID BM, Joen Laura, MD, 237 mL at 08/15/22 1458   fentaNYL (SUBLIMAZE) injection 50 mcg, 50 mcg, Intravenous, Once, Joen Laura, MD   finasteride (PROSCAR) tablet 5 mg, 5 mg, Oral, Daily, Joen Laura, MD, 5 mg at 08/16/22 0851   gabapentin (NEURONTIN) capsule 300 mg, 300 mg, Oral, BID, Gawne, Meghan M, PA-C, 300 mg at 08/16/22 5409   guaiFENesin tablet 400 mg, 400 mg, Oral, QHS, Joen Laura, MD, 400 mg at 08/15/22 2059   insulin aspart (novoLOG) injection 0-9 Units, 0-9 Units, Subcutaneous, TID WC, Joen Laura, MD, 2 Units at 08/16/22 1249    isosorbide mononitrate (IMDUR) 24 hr tablet 120 mg, 120 mg, Oral, Daily, Joen Laura, MD, 120 mg at 08/16/22 0856   lidocaine (LIDODERM) 5 % 1 patch, 1 patch, Transdermal, Q24H, Berton Mount I, MD, 1 patch at 08/15/22 1639   nitroGLYCERIN (NITROSTAT) SL tablet 0.4 mg, 0.4 mg, Sublingual, Q5 min PRN, Lewie Chamber, MD   oxyCODONE (Oxy IR/ROXICODONE) immediate release tablet 5-10  mg, 5-10 mg, Oral, Q4H PRN, Lewie Chamber, MD   pantoprazole (PROTONIX) EC tablet 40 mg, 40 mg, Oral, Daily, Joen Laura, MD, 40 mg at 08/16/22 0850   polyethylene glycol (MIRALAX / GLYCOLAX) packet 34 g, 34 g, Oral, Daily, Wouk, Wilfred Curtis, MD, 34 g at 08/15/22 1139   primidone (MYSOLINE) tablet 50 mg, 50 mg, Oral, QHS, Joen Laura, MD, 50 mg at 08/15/22 2058   senna-docusate (Senokot-S) tablet 1 tablet, 1 tablet, Oral, QHS, Wouk, Wilfred Curtis, MD, 1 tablet at 08/15/22 2110   sucralfate (CARAFATE) 1 GM/10ML suspension 1 g, 1 g, Oral, QID, Joen Laura, MD, 1 g at 08/16/22 1119   Patients Current Diet:  Diet Order                  Diet regular Room service appropriate? Yes; Fluid consistency: Thin  Diet effective now                       Precautions / Restrictions Precautions Precautions: Fall Precaution Comments: WBAT, no precautions Restrictions Weight Bearing Restrictions: Yes LLE Weight Bearing: Weight bearing as tolerated    Has the patient had 2 or more falls or a fall with injury in the past year? Yes   Prior Activity Level Community (5-7x/wk): drives, very active   Prior Functional Level Self Care: Did the patient need help bathing, dressing, using the toilet or eating? Independent   Indoor Mobility: Did the patient need assistance with walking from room to room (with or without device)? Independent   Stairs: Did the patient need assistance with internal or external stairs (with or without device)? Independent   Functional Cognition: Did the patient  need help planning regular tasks such as shopping or remembering to take medications? Independent   Patient Information Are you of Hispanic, Latino/a,or Spanish origin?: A. No, not of Hispanic, Latino/a, or Spanish origin What is your race?: A. White Do you need or want an interpreter to communicate with a doctor or health care staff?: 0. No   Patient's Response To:  Health Literacy and Transportation Is the patient able to respond to health literacy and transportation needs?: Yes Health Literacy - How often do you need to have someone help you when you read instructions, pamphlets, or other written material from your doctor or pharmacy?: Never In the past 12 months, has lack of transportation kept you from medical appointments or from getting medications?: No In the past 12 months, has lack of transportation kept you from meetings, work, or from getting things needed for daily living?: No   Journalist, newspaper / Equipment Home Assistive Devices/Equipment: None Home Equipment: Agricultural consultant (2 wheels), [employer] - quad, Information systems manager - built in, [employer] - tub/shower   Prior Device Use: Indicate devices/aids used by the patient prior to current illness, exacerbation or injury? None of the above   Current Functional Level Cognition   Overall Cognitive Status: Within Functional Limits for tasks assessed Orientation Level: Oriented X4 General Comments: highly motivated    Extremity Assessment (includes Sensation/Coordination)   Upper Extremity Assessment: Overall WFL for tasks assessed  Lower Extremity Assessment: Defer to PT evaluation LLE Deficits / Details: generally weak on hip and cannot move without support x with gait LLE: Unable to fully assess due to pain LLE Coordination: decreased gross motor     ADLs   Overall ADL's : Needs assistance/impaired Eating/Feeding: Supervision/ safety, Set up, Sitting Grooming: Wash/dry hands, Wash/dry face, Oral  care, Brushing hair, Min guard,  Standing Grooming Details (indicate cue type and reason): at sink Upper Body Bathing: Set up, Supervision/ safety, Sitting Lower Body Bathing: Moderate assistance, Sit to/from stand Upper Body Dressing : Supervision/safety, Set up, Sitting Lower Body Dressing: Maximal assistance, Sit to/from stand Toilet Transfer: Minimal assistance, Ambulation, Rolling walker (2 wheels) Toilet Transfer Details (indicate cue type and reason): simulated to recliner Functional mobility during ADLs: Minimal assistance, Rolling walker (2 wheels) General ADL Comments: Pt with decreased balance, strength, and activity tolerance.     Mobility   Overal bed mobility: Needs Assistance Bed Mobility: Supine to Sit Supine to sit: Min assist General bed mobility comments: assistance with LLE and use of bed rail     Transfers   Overall transfer level: Needs assistance Equipment used: Rolling walker (2 wheels) Transfers: Sit to/from Stand Sit to Stand: Min assist General transfer comment: min assist with cues for hand placement     Ambulation / Gait / Stairs / Wheelchair Mobility   Ambulation/Gait Ambulation/Gait assistance: Land (Feet): 80 Feet Assistive device: Rolling walker (2 wheels) Gait Pattern/deviations: Step-to pattern, Decreased stride length, Decreased weight shift to left General Gait Details: Cues for sequence and RW proximity.  Pt with slow gait and stopping when talking.  Required 1 extended rest break Gait velocity: decrased Gait velocity interpretation: <1.8 ft/sec, indicate of risk for recurrent falls Pre-gait activities: standing balance     Posture / Balance Balance Overall balance assessment: Needs assistance Sitting-balance support: Feet supported Sitting balance-Leahy Scale: Good Standing balance support: Bilateral upper extremity supported, During functional activity, Single extremity supported Standing balance-Leahy Scale: Poor Standing balance comment: single  extremity support during grooming tasks standing at sink     Special needs/care consideration      Previous Home Environment  Living Arrangements: Spouse/significant other  Lives With: Spouse Available Help at Discharge: Friend(s), Available 24 hours/day Type of Home: Other(Comment) Home Layout: One level Home Access: Stairs to enter Entrance Stairs-Rails: None Entrance Stairs-Number of Steps: 1 Bathroom Shower/Tub: Health visitor: Standard Bathroom Accessibility: Yes How Accessible: Accessible via walker Home Care Services: No   Discharge Living Setting Plans for Discharge Living Setting: Patient's home Type of Home at Discharge: Other (Comment) (condo) Discharge Home Layout: One level Discharge Home Access: Stairs to enter Entrance Stairs-Rails: None Entrance Stairs-Number of Steps: 1 Discharge Bathroom Shower/Tub: Walk-in shower Discharge Bathroom Toilet: Standard Discharge Bathroom Accessibility: Yes How Accessible: Accessible via walker Does the patient have any problems obtaining your medications?: No   Social/Family/Support Systems Anticipated Caregiver: Raygan Chinchilla (wife), Casimiro Needle and Dois Davenport (son and daughter-in-law) Anticipated Caregiver's Contact Information: Doris: [phone] Caregiver Availability: 24/7 Discharge Plan Discussed with Primary Caregiver: Yes Is Caregiver In Agreement with Plan?: Yes Does Caregiver/Family have Issues with Lodging/Transportation while Pt is in Rehab?: No   Goals Patient/Family Goal for Rehab: Supervision: PT, Mod I-Min A: OT Expected length of stay: 7-10 days Pt/Family Agrees to Admission and willing to participate: Yes Program Orientation Provided & Reviewed with Pt/Caregiver Including Roles  & Responsibilities: Yes   Decrease burden of Care through IP rehab admission: NA   Possible need for SNF placement upon discharge: Not anticipated   Patient Condition: I have reviewed medical records from [facility], spoken with CSW, and patient and spouse. I met with patient at the bedside and discussed via phone for inpatient rehabilitation assessment.  Patient will benefit from ongoing PT and OT, can actively participate in 3 hours of therapy  a day 5 days of the week, and can make measurable gains during the admission.  Patient will also benefit from the coordinated team approach during an Inpatient Acute Rehabilitation admission.  The patient will receive intensive therapy as well as Rehabilitation physician, nursing, social worker, and care management interventions.  Due to safety, skin/wound care, disease management, medication administration, pain management, and patient education the patient requires 24 hour a day rehabilitation nursing.  The patient is currently min assist overall with mobility and basic ADLs.  Discharge setting and therapy post discharge at home with home health is anticipated.  Patient has agreed to participate in the Acute Inpatient Rehabilitation Program and will admit today.   Preadmission Screen Completed By:  Ottie Glazier RN MSN, 08/16/2022 1:10 PM ______________________________________________________________________   Discussed status with Dr. Carlis Abbott on 08/16/22 at 1309 and received approval for admission today.   Admission Coordinator:  Ottie Glazier RN MSN  time 1309 Date 08/16/22    Assessment/Plan: Diagnosis: Hip fracture Does the need for close, 24 hr/day Medical supervision in concert with the patient's rehab needs make it unreasonable for this patient to be served in a less intensive setting? Yes Co-Morbidities requiring supervision/potential complications: overweight, CKD stage 4, essential HTN, Diabetic peripheral neuropathy, diabetes Due to bladder management, bowel management, safety, skin/wound care, disease management, medication administration, pain management, and patient education, does the patient require 24 hr/day rehab nursing? Yes Does the patient  require coordinated care of a physician, rehab nurse, PT, OT to address physical and functional deficits in the context of the above medical diagnosis(es)? Yes Addressing deficits in the following areas: balance, endurance, locomotion, strength, transferring, bowel/bladder control, bathing, dressing, feeding, grooming, toileting, and psychosocial support Can the patient actively participate in an intensive therapy program of at least 3 hrs of therapy 5 days a week? Yes The potential for patient to make measurable gains while on inpatient rehab is excellent Anticipated functional outcomes upon discharge from inpatient rehab: modified independent PT, modified independent OT, independent SLP Estimated rehab length of stay to reach the above functional goals is: 7 days Anticipated discharge destination: Home 10. Overall Rehab/Functional Prognosis: excellent     MD Signature: Sula Soda, MD         Revision History  Routing History

## 2022-08-16 NOTE — Progress Notes (Signed)
Occupational Therapy Treatment Patient Details Name: Marco Acevedo MRN: [id number] DOB: Sep 19, 1934 Today's Date: 08/16/2022   History of present illness 88 yo [place] on 5/2 for management of his L hip fracture with IM nailing.  Pt is mainly having issues with pain, but is motivated to move.  PMHx:  CAD, percutaneous coronary angioplasty, fall, heart stents, CHF, CKD3b, DM, COPD, GERD   OT comments  Patient making good progress with min assist to get to EOB due to assistance needed with LLE and cues for hand placement. Patient able to ambulate to sink for grooming tasks with one extremity support. Patient states he has most difficulty with sit to stands and performed 10 from recliner with min assist. Patient will benefit from continued inpatient follow up therapy, <3 hours/day to address LB ADLs and functional transfers. Acute OT to continue to follow.    Recommendations for follow up therapy are one component of a multi-disciplinary discharge planning process, led by the attending physician.  Recommendations may be updated based on patient status, additional functional criteria and insurance authorization.    Assistance Recommended at Discharge Frequent or constant Supervision/Assistance  Patient can return home with the following  A little help with walking and/or transfers;A little help with bathing/dressing/bathroom   Equipment Recommendations  Other (comment) (defer to next venue)    Recommendations for Other Services      Precautions / Restrictions Precautions Precautions: Fall Precaution Comments: WBAT, no precautions Restrictions Weight Bearing Restrictions: Yes LLE Weight Bearing: Weight bearing as tolerated       Mobility Bed Mobility Overal bed mobility: Needs Assistance Bed Mobility: Supine to Sit     Supine to sit: Min assist     General bed mobility comments: assistance with LLE and use of bed rail     Transfers Overall transfer level: Needs assistance Equipment used: Rolling walker (2 wheels) Transfers: Sit to/from Stand Sit to Stand: Min assist           General transfer comment: min assist with cues for hand placement     Balance Overall balance assessment: Needs assistance Sitting-balance support: Feet supported Sitting balance-Leahy Scale: Good     Standing balance support: Bilateral upper extremity supported, During functional activity, Single extremity supported Standing balance-Leahy Scale: Poor Standing balance comment: single extremity support during grooming tasks standing at sink                           ADL either performed or assessed with clinical judgement   ADL Overall ADL's : Needs assistance/impaired     Grooming: Wash/dry hands;Wash/dry face;Oral care;Brushing hair;Min guard;Standing Grooming Details (indicate cue type and reason): at sink                 Toilet Transfer: Minimal assistance;Ambulation;Rolling walker (2 wheels) Toilet Transfer Details (indicate cue type and reason): simulated to recliner                Extremity/Trunk Assessment              Vision       Perception     Praxis      Cognition Arousal/Alertness: Awake/alert Behavior During Therapy: WFL for tasks assessed/performed Overall Cognitive Status: Within Functional Limits for tasks assessed  General Comments: highly motivated        Exercises Exercises: Other exercises Other Exercises Other Exercises: 10 sit to stands from recliner with min assist    Shoulder Instructions       General Comments      Pertinent Vitals/ Pain       Pain Assessment Pain Assessment: Faces Faces Pain Scale: Hurts little more Pain Location: L hip Pain Descriptors / Indicators: Operative site guarding, Grimacing, Guarding Pain Intervention(s): Limited activity within patient's tolerance, Monitored  during session, Repositioned  Home Living                                          Prior Functioning/Environment              Frequency  Min 2X/week        Progress Toward Goals  OT Goals(current goals can now be found in the care plan section)  Progress towards OT goals: Progressing toward goals  Acute Rehab OT Goals Patient Stated Goal: go to rehab OT Goal Formulation: With patient Time For Goal Achievement: 08/27/22 Potential to Achieve Goals: Good ADL Goals Pt Will Perform Lower Body Dressing: with min guard assist;sit to/from stand Pt Will Transfer to Toilet: with min guard assist;ambulating;bedside commode Pt Will Perform Toileting - Clothing Manipulation and hygiene: with min guard assist;sit to/from stand;sitting/lateral leans Additional ADL Goal #1: Pt will perform bed mobility with Min Guard A in preparation for ADLs  Plan Discharge plan remains appropriate    Co-evaluation                 AM-PAC OT "6 Clicks" Daily Activity     Outcome Measure   Help from another person eating meals?: None Help from another person taking care of personal grooming?: A Little Help from another person toileting, which includes using toliet, bedpan, or urinal?: A Little Help from another person bathing (including washing, rinsing, drying)?: A Lot Help from another person to put on and taking off regular upper body clothing?: A Little Help from another person to put on and taking off regular lower body clothing?: A Lot 6 Click Score: 17    End of Session Equipment Utilized During Treatment: Gait belt;Rolling walker (2 wheels)  OT Visit Diagnosis: Unsteadiness on feet (R26.81);Other abnormalities of gait and mobility (R26.89);Muscle weakness (generalized) (M62.81)   Activity Tolerance Patient tolerated treatment well   Patient Left in chair;with call bell/phone within reach;with chair alarm set   Nurse Communication Mobility status         Time: 1610-9604 OT Time Calculation (min): 38 min  Charges: OT General Charges $OT Visit: 1 Visit OT Treatments $Self Care/Home Management : 23-37 mins $Therapeutic Exercise: 8-22 mins  Alfonse Flavors, OTA Acute Rehabilitation Services  Office [phone]   Dewain Penning 08/16/2022, 8:26 AM

## 2022-08-16 NOTE — Progress Notes (Signed)
Inpatient Rehabilitation Admissions Coordinator   I have VA insurance approval and CIR bed to admit him to today. Acute team and TOC made aware. I will make the arrangements to admit today.  Ottie Glazier, RN, MSN Rehab Admissions Coordinator [phone] 08/16/2022 1:42 PM

## 2022-08-16 NOTE — Progress Notes (Signed)
Inpatient Rehabilitation Admissions Coordinator   I await approval for CIR admit from the [place]. I met with patient to discuss the difference in cost for Cir with VA approval vs [employer] Medicare. He wishes to pursue approval with the [place].  Ottie Glazier, RN, MSN Rehab Admissions Coordinator [phone] 08/16/2022 11:00 AM

## 2022-08-17 DIAGNOSIS — S728X2A Other fracture of left femur, initial encounter for closed fracture: Secondary | ICD-10-CM | POA: Diagnosis not present

## 2022-08-17 LAB — COMPREHENSIVE METABOLIC PANEL
ALT: 15 U/L (ref 0–44)
AST: 27 U/L (ref 15–41)
Albumin: 3.2 g/dL — ABNORMAL LOW (ref 3.5–5.0)
Alkaline Phosphatase: 83 U/L (ref 38–126)
Anion gap: 8 (ref 5–15)
BUN: 50 mg/dL — ABNORMAL HIGH (ref 8–23)
CO2: 24 mmol/L (ref 22–32)
Calcium: 8.9 mg/dL (ref 8.9–10.3)
Chloride: 103 mmol/L (ref 98–111)
Creatinine, Ser: 2.42 mg/dL — ABNORMAL HIGH (ref 0.61–1.24)
GFR, Estimated: 26 mL/min — ABNORMAL LOW (ref >60)
Glucose, Bld: 192 mg/dL — ABNORMAL HIGH (ref 70–99)
Potassium: 4.6 mmol/L (ref 3.5–5.1)
Sodium: 135 mmol/L (ref 135–145)
Total Bilirubin: 1.5 mg/dL — ABNORMAL HIGH (ref 0.3–1.2)
Total Protein: 6.6 g/dL (ref 6.5–8.1)

## 2022-08-17 LAB — CBC WITH DIFFERENTIAL/PLATELET
Abs Immature Granulocytes: 0.03 10*3/uL (ref 0.00–0.07)
Basophils Absolute: 0.1 10*3/uL (ref 0.0–0.1)
Basophils Relative: 1 %
Eosinophils Absolute: 0.4 10*3/uL (ref 0.0–0.5)
Eosinophils Relative: 6 %
HCT: 30.2 % — ABNORMAL LOW (ref 39.0–52.0)
Hemoglobin: 10.2 g/dL — ABNORMAL LOW (ref 13.0–17.0)
Immature Granulocytes: 0 %
Lymphocytes Relative: 15 %
Lymphs Abs: 1.1 10*3/uL (ref 0.7–4.0)
MCH: 29.6 pg (ref 26.0–34.0)
MCHC: 33.8 g/dL (ref 30.0–36.0)
MCV: 87.5 fL (ref 80.0–100.0)
Monocytes Absolute: 0.6 10*3/uL (ref 0.1–1.0)
Monocytes Relative: 8 %
Neutro Abs: 4.9 10*3/uL (ref 1.7–7.7)
Neutrophils Relative %: 70 %
Platelets: 236 10*3/uL (ref 150–400)
RBC: 3.45 MIL/uL — ABNORMAL LOW (ref 4.22–5.81)
RDW: 14.1 % (ref 11.5–15.5)
WBC: 7 10*3/uL (ref 4.0–10.5)
nRBC: 0 % (ref 0.0–0.2)

## 2022-08-17 LAB — GLUCOSE, CAPILLARY
Glucose-Capillary: 154 mg/dL — ABNORMAL HIGH (ref 70–99)
Glucose-Capillary: 156 mg/dL — ABNORMAL HIGH (ref 70–99)
Glucose-Capillary: 181 mg/dL — ABNORMAL HIGH (ref 70–99)

## 2022-08-17 LAB — VITAMIN D 25 HYDROXY (VIT D DEFICIENCY, FRACTURES): Vit D, 25-Hydroxy: 35.98 ng/mL (ref 30–100)

## 2022-08-17 MED ORDER — GABAPENTIN 300 MG PO CAPS
600.0000 mg | ORAL_CAPSULE | Freq: Every day | ORAL | Status: DC
  Administered 2022-08-17 – 2022-08-30 (×14): 600 mg via ORAL
  Filled 2022-08-17 (×14): qty 2

## 2022-08-17 MED ORDER — HYDROCORTISONE (PERIANAL) 2.5 % EX CREA
TOPICAL_CREAM | Freq: Two times a day (BID) | CUTANEOUS | Status: DC | PRN
  Filled 2022-08-17: qty 28.35

## 2022-08-17 MED ORDER — DAPAGLIFLOZIN PROPANEDIOL 10 MG PO TABS
10.0000 mg | ORAL_TABLET | Freq: Every day | ORAL | Status: DC
  Administered 2022-08-18 – 2022-08-31 (×14): 10 mg via ORAL
  Filled 2022-08-17 (×14): qty 1

## 2022-08-17 NOTE — Plan of Care (Signed)
  Problem: RH Balance Goal: LTG Patient will maintain dynamic standing with ADLs (OT) Description: LTG:  Patient will maintain dynamic standing balance with assist during activities of daily living (OT)  Flowsheets (Taken 08/17/2022 1222) LTG: Pt will maintain dynamic standing balance during ADLs with: Independent with assistive device   Problem: RH Grooming Goal: LTG Patient will perform grooming w/assist,cues/equip (OT) Description: LTG: Patient will perform grooming with assist, with/without cues using equipment (OT) Flowsheets (Taken 08/17/2022 1222) LTG: Pt will perform grooming with assistance level of: Independent with assistive device    Problem: RH Bathing Goal: LTG Patient will bathe all body parts with assist levels (OT) Description: LTG: Patient will bathe all body parts with assist levels (OT) Flowsheets (Taken 08/17/2022 1222) LTG: Pt will perform bathing with assistance level/cueing: Independent with assistive device    Problem: RH Dressing Goal: LTG Patient will perform upper body dressing (OT) Description: LTG Patient will perform upper body dressing with assist, with/without cues (OT). Flowsheets (Taken 08/17/2022 1222) LTG: Pt will perform upper body dressing with assistance level of: Independent Goal: LTG Patient will perform lower body dressing w/assist (OT) Description: LTG: Patient will perform lower body dressing with assist, with/without cues in positioning using equipment (OT) Flowsheets (Taken 08/17/2022 1222) LTG: Pt will perform lower body dressing with assistance level of: Independent with assistive device   Problem: RH Toileting Goal: LTG Patient will perform toileting task (3/3 steps) with assistance level (OT) Description: LTG: Patient will perform toileting task (3/3 steps) with assistance level (OT)  Flowsheets (Taken 08/17/2022 1222) LTG: Pt will perform toileting task (3/3 steps) with assistance level: Independent with assistive device   Problem: RH Simple  Meal Prep Goal: LTG Patient will perform simple meal prep w/assist (OT) Description: LTG: Patient will perform simple meal prep with assistance, with/without cues (OT). Flowsheets (Taken 08/17/2022 1222) LTG: Pt will perform simple meal prep with assistance level of: Independent with assistive device   Problem: RH Toilet Transfers Goal: LTG Patient will perform toilet transfers w/assist (OT) Description: LTG: Patient will perform toilet transfers with assist, with/without cues using equipment (OT) Flowsheets (Taken 08/17/2022 1222) LTG: Pt will perform toilet transfers with assistance level of: Independent with assistive device   Problem: RH Tub/Shower Transfers Goal: LTG Patient will perform tub/shower transfers w/assist (OT) Description: LTG: Patient will perform tub/shower transfers with assist, with/without cues using equipment (OT) Flowsheets (Taken 08/17/2022 1222) LTG: Pt will perform tub/shower stall transfers with assistance level of: Independent with assistive device   Problem: RH Furniture Transfers Goal: LTG Patient will perform furniture transfers w/assist (OT/PT) Description: LTG: Patient will perform furniture transfers  with assistance (OT/PT). Flowsheets (Taken 08/17/2022 1222) LTG: Pt will perform furniture transfers with assist:: Independent with assistive device

## 2022-08-17 NOTE — Progress Notes (Signed)
PROGRESS NOTE   Subjective/Complaints:  Pt reports some of his meds "are messed up"- took Glipizide at home, even though Cr usually 2-2.5- notes he also took [place] before he ate as well as Prevacid and Coreg.   Also took gabapentin at bedtime- only 1x/day for neuropathy.   Having L calf, groin and L lateral hip pain- pain so bad, makes LLE feel "paralyzed".  LBM 2 days ago- denies constipation.   New heartburn- hasn't had before, although was on Prevacid at home.   Cr 2.42 (was 2.3- but in his normal range). BUN 50 down from 53   Also notes that peed his bed 2-3x last night since couldn't sit on EOB to use urinal and nurses took 'took long" but he admits that sometimes only gets a couple of minutes warning.   Spoke to nursing, bed alarm at least sensitive.    ROS:  Pt denies SOB, abd pain, CP, N/V/C/D, and vision changes Except for HPI  Objective:   No results found. Recent Labs    08/16/22 0207 08/17/22 0657  WBC 6.2 7.0  HGB 9.0* 10.2*  HCT 27.0* 30.2*  PLT 195 236   Recent Labs    08/16/22 0207 08/17/22 0657  NA 134* 135  K 4.3 4.6  CL 103 103  CO2 24 24  GLUCOSE 161* 192*  BUN 53* 50*  CREATININE 2.30* 2.42*  CALCIUM 8.5* 8.9    Intake/Output Summary (Last 24 hours) at 08/17/2022 0928 Last data filed at 08/17/2022 0841 Gross per 24 hour  Intake 240 ml  Output 1450 ml  Net -1210 ml        Physical Exam: Vital Signs Blood pressure (!) 182/84, pulse 86, temperature 98.1 F (36.7 C), temperature source Oral, resp. rate 15, weight 88.2 kg, SpO2 94 %.      General: awake, alert, appropriate, talkative; sitting up in bed; NAD HENT: conjugate gaze; oropharynx moist CV: regular rate and rhythm; no JVD Pulmonary: CTA B/L; no W/R/R- good air movement GI: soft, NT, ND, (+)BS- slightly hypoactive Psychiatric: appropriate Neurological: Ox3 Extremities: no LE edema seen Skin: dressing on L  lateral hip MS: LLE strength 2-/5 in L HF, KE, DF and PF at 4-4+/5- limited by pain  Assessment/Plan: 1. Functional deficits which require 3+ hours per day of interdisciplinary therapy in a comprehensive inpatient rehab setting. Physiatrist is providing close team supervision and 24 hour management of active medical problems listed below. Physiatrist and rehab team continue to assess barriers to discharge/monitor patient progress toward functional and medical goals  Care Tool:  Bathing              Bathing assist       Upper Body Dressing/Undressing Upper body dressing        Upper body assist      Lower Body Dressing/Undressing Lower body dressing            Lower body assist       Toileting Toileting    Toileting assist       Transfers Chair/bed transfer  Transfers assist           Locomotion Ambulation   Ambulation assist  Walk 10 feet activity   Assist           Walk 50 feet activity   Assist           Walk 150 feet activity   Assist           Walk 10 feet on uneven surface  activity   Assist           Wheelchair     Assist               Wheelchair 50 feet with 2 turns activity    Assist            Wheelchair 150 feet activity     Assist          Blood pressure (!) 182/84, pulse 86, temperature 98.1 F (36.7 C), temperature source Oral, resp. rate 15, weight 88.2 kg, SpO2 94 %.  Medical Problem List and Plan: 1. Functional deficits secondary to left intertrochanteric femur fracture S/P IM nail and ORIF- is WBAT             -patient may shower             -ELOS/Goals: 7 days modI             First day of evaluations- con't CIR PT and OT- WBAT  2.  Antithrombotics: -DVT/anticoagulation:  Pharmaceutical: Lovenox 30 mg X 4 weeks             -antiplatelet therapy: aspirin 81 mg daily   3. Pain Management: Tylenol as needed             -continue gabapentin 300  mg BID             -continue Lidoderm daily   5/9- switched gabapentin to 600 mg QHS since pt only took 300 mg QHS at home- also on Oxy 5-10 mg q4 hours prn- asked pt to take prior to AM therapy.  4. Mood/Behavior/Sleep: LCSW to evaluate and provide emotional support             -antipsychotic agents: n/a   5. Neuropsych/cognition: This patient is capable of making decisions on his own behalf.   6. Skin/Wound Care: Routine skin care checks   5/9- Dressing in place- con't dressings 7. Fluids/Electrolytes/Nutrition: Routine Is and Os and follow-up chemistries             -carb modified diet   8: Hypertension: monitor TID and prn             -continue Norvasc 5  mg daily             -continue Coreg 3.125 mg BID             -continue Imdur 120 mg daily   5/9- BP labile- 110s-180s in last 24 hours- will monitor for trend 9: Hyperlipidemia: continue statin   10: BPH/urinary frequency/retention: continue Proscar   11: History of gout: continue allopurinol   12: Left hip fracture s/p IM nail 5/03 Dr. Blanchie Dessert             -WBAT LLE             -change dressing as needed for soiling/if wet   13: CAD: s/p stenting X 2; stable. On statin, aspirin and BB. Restart Plavix   14: CKD stage IIIb>>IV: baseline Cr ~2.5 (home on Farxiga)             -follow-up BMP   5/9-  Will restart Farxiga-  15: COPD: stable             -continue albuterol inhaler prn phlegm   16: GERD/HH: continue Protonix and Carafate   17: OSA: not using CPAP (he is not clear regarding results of sleep study)   18: DM-2: CBGs QID and SSI (home on glipizide and Farxiga)  A1c 6.5%   5/9- will not restart Glipizide Cr is 2.42- will restart Farxiga 10 mg daily.  19: Chronic diastolic CHF: continue meds as above             -daily weight   5/9- Weight 88.2 kg today 20: ABLA, chronic anemia: follow-up CBC             -restart PO iron   5/9- Hb up to 10- con't to monitor 21: Pruritus/rash on back: will order Sarna               22: Tremors: continue primidone   23. Screening for vitamin D deficiency: check vitamin D level tomorrow   23. Hypotension: decrease amlodipine to 2.5mg  daily.  5/9- BP running 110s-180s- will see how trends go- since somewhat labile right now.    I spent a total of 41   minutes on total care today- >50% coordination of care- due to  Spent 30+ minutes going over pt's home meds with him and he's upset they are "off"- also d/w nursing about bed alarm and need to have help- will place on timed voiding since getting signal/needs to pee so rapidly.  Also spoke with team/PA about DM/Farxiga      LOS: 1 days A FACE TO FACE EVALUATION WAS PERFORMED  Christpher Stogsdill 08/17/2022, 9:28 AM

## 2022-08-17 NOTE — Progress Notes (Signed)
Inpatient Rehabilitation Care Coordinator Assessment and Plan Patient Details  Name: Marco Acevedo MRN: [id number] Date of Birth: 11/20/1934  Today's Date: 08/17/2022  Hospital Problems: Principal Problem:   Other fracture of left femur, initial encounter for closed fracture [facility])  Past Medical History:  Past Medical History:  Diagnosis Date   Anemia    low iron   Aortic stenosis    Arthritis    BPH (benign prostatic hyperplasia)    CAD (coronary artery disease)    Chronic diastolic CHF (congestive heart failure) (HCC)    CKD (chronic kidney disease), stage IV (HCC)    Claustrophobia    Complication of anesthesia    has to have head elevated to keep from strangling on post nasal drip   COPD (chronic obstructive pulmonary disease) (HCC)    Diabetes mellitus type II    type 2   GERD (gastroesophageal reflux disease)    Gout    Granuloma annulare    History of hiatal hernia    History of kidney stones    Litthotrispy   Hyperlipidemia    Hypertension    Neuropathy    Obesity    OSA (obstructive sleep apnea)    Stroke (HCC) 10/2017   Weakness right arm and leg   Past Surgical History:  Past Surgical History:  Procedure Laterality Date   BLEPHAROPLASTY     CARDIAC CATHETERIZATION  4098,1191   X 2 stents   CHOLECYSTECTOMY N/A 09/23/2014   Procedure: LAPAROSCOPIC CHOLECYSTECTOMY WITH INTRAOPERATIVE CHOLANGIOGRAM;  Surgeon: Chevis Pretty [name], MD;  Location: [facility] OR;  Service: General;  Laterality: N/A;   COLONOSCOPY     COLONOSCOPY WITH PROPOFOL N/A 03/22/2016   Procedure: COLONOSCOPY WITH PROPOFOL;  Surgeon: Charlott Rakes, MD;  Location: [facility] ENDOSCOPY;  Service: Endoscopy;  Laterality: N/A;   ENDARTERECTOMY Left 10/19/2017   Procedure: ENDARTERECTOMY CAROTID LEFT;  Surgeon: Larina Earthly, MD;  Location: [facility] OR;  Service: Vascular;  Laterality: Left;   ESOPHAGOGASTRODUODENOSCOPY (EGD) WITH PROPOFOL N/A 03/22/2016   Procedure: ESOPHAGOGASTRODUODENOSCOPY (EGD) WITH PROPOFOL;   Surgeon: Charlott Rakes, MD;  Location: [facility] ENDOSCOPY;  Service: Endoscopy;  Laterality: N/A;   EYE SURGERY     both cataracts   INTRAMEDULLARY (IM) NAIL INTERTROCHANTERIC Left 08/11/2022   Procedure: INTRAMEDULLARY (IM) NAIL INTERTROCHANTERIC;  Surgeon: Joen Laura, MD;  Location: [facility] OR;  Service: Orthopedics;  Laterality: Left;   KNEE ARTHROSCOPY WITH MEDIAL MENISECTOMY Right 08/19/2013   Procedure: RIGHT KNEE ARTHROSCOPY WITH PARTIAL MEDIAL MENISECTOMY AND CHONDROPLASTY;  Surgeon: Velna Ochs, MD;  Location: [facility];  Service: Orthopedics;  Laterality: Right;   LEFT HEART CATH AND CORONARY ANGIOGRAPHY N/A 06/28/2016   Procedure: Left Heart Cath and Coronary Angiography;  Surgeon: Lyn Records, MD;  Location: [facility] INVASIVE CV LAB;  Service: Cardiovascular;  Laterality: N/A;   LEFT HEART CATH AND CORONARY ANGIOGRAPHY N/A 02/25/2019   Procedure: LEFT HEART CATH AND CORONARY ANGIOGRAPHY;  Surgeon: Lyn Records, MD;  Location: [facility] INVASIVE CV LAB;  Service: Cardiovascular;  Laterality: N/A;   LEFT HEART CATHETERIZATION WITH CORONARY ANGIOGRAM N/A 10/03/2011   Procedure: LEFT HEART CATHETERIZATION WITH CORONARY ANGIOGRAM;  Surgeon: Lesleigh Noe, MD;  Location: [facility] CATH LAB;  Service: Cardiovascular;  Laterality: N/A;   LEFT HEART CATHETERIZATION WITH CORONARY ANGIOGRAM N/A 09/12/2012   Procedure: LEFT HEART CATHETERIZATION WITH CORONARY ANGIOGRAM;  Surgeon: Lesleigh Noe, MD;  Location: [facility] CATH LAB;  Service: Cardiovascular;  Laterality: N/A;   LIPOMA EXCISION  Biospy only; left parotid gland   LITHOTRIPSY     none     PERCUTANEOUS CORONARY STENT INTERVENTION (PCI-S) N/A 09/17/2012   Procedure: PERCUTANEOUS CORONARY STENT INTERVENTION (PCI-S);  Surgeon: Lesleigh Noe, MD;  Location: [facility] CATH LAB;  Service: Cardiovascular;  Laterality: N/A;   TRIGGER FINGER RELEASE Left 12/21/2015   Procedure: LEFT LONG FINGER TRIGGER RELEASE ;  Surgeon: Mack Hook, MD;   Location: [facility];  Service: Orthopedics;  Laterality: Left;   Social History:  reports that he quit smoking about 11 years ago. His smoking use included cigarettes. He has a 64.00 pack-year smoking history. He has never used smokeless tobacco. He reports current alcohol use. He reports that he does not use drugs.  Family / Support Systems Marital Status: Married Patient Roles: Spouse, Parent Spouse/Significant Other: Tyler Aas  [phone] Children: Emeterio Reeve  [phone] Other Supports: Sandra-daughter in-law Anticipated Caregiver: Wife and son along with daughter in-law if needed Ability/Limitations of Caregiver: Wife has RA and not able to provide much assist-son and daughter in-law work but if needed can make arrangements Caregiver Availability: 24/7 Family Dynamics: Close knit family and extended family who willpull together if pt requires care at discharge from here. He is doing quite well and hopefully will reach mod/i level for DC  Social History Preferred language: English Religion: Catholic Cultural Background: No issues Education: HS Health Literacy - How often do you need to have someone help you when you read instructions, pamphlets, or other written material from your doctor or pharmacy?: Never Writes: Yes Employment Status: Retired Marine scientist Issues: No issues Guardian/Conservator: None-according to MD pt is capable of making his own decisions while here. Son is present and will be here throughout his stay   Abuse/Neglect Abuse/Neglect Assessment Can Be Completed: Yes Physical Abuse: Denies Verbal Abuse: Denies Sexual Abuse: Denies Exploitation of patient/patient's resources: Denies Self-Neglect: Denies  Patient response to: Social Isolation - How often do you feel lonely or isolated from those around you?: Never  Emotional Status Pt's affect, behavior and adjustment status: Pt is motivated to do well and recover from his hip fracture.  He is very active, plays golf and goes to the gym 2x week. He was doing well in his golf game when he fell. He was driving and does the cooking for he and wife Recent Psychosocial Issues: other health issues was managing prior to this fall Psychiatric History: No history seems to be coping appropriately and able to verbalize his feelings and concerns. Substance Abuse History: No issues  Patient / Family Perceptions, Expectations & Goals Pt/Family understanding of illness & functional limitations: Pt and son can explain his hip fracture and WB issues. Both have spoken with the MD and feel their questions and concerns are being addressed. Pt feel he has a good understanding of treatment plan moving forward. Premorbid pt/family roles/activities: husband, father, grandfather, great grandfather, retiree, veteran, home owner, etc Anticipated changes in roles/activities/participation: resume Pt/family expectations/goals: Pt states:  " I hope to be able to do for myself when I leave here."  Son states: " We will do what he needs help with but feel he will do well here."  [employer]: Other (Comment) ([facility] [place] Premorbid Home Care/DME Agencies: Other (Comment) (rw, quad cane, built in tub seat, grab bars) Transportation available at discharge: self and wife and son Is the patient able to respond to transportation needs?: Yes In the past 12 months, has lack of transportation kept you  from medical appointments or from getting medications?: No In the past 12 months, has lack of transportation kept you from meetings, work, or from getting things needed for daily living?: No  Discharge Planning Living Arrangements: Spouse/significant other Support Systems: Spouse/significant other, Children, Other relatives, Friends/neighbors, Church/faith community Type of Residence: Private residence Insurance Resources: Media planner (specify) ([employer] Scientist, water quality) Financial Resources: Social Security Financial Screen Referred: No Living Expenses: Own Money Management: Patient, Spouse Does the patient have any problems obtaining your medications?: No Home Management: both wife does the laundry and pt does the cooking Patient/Family Preliminary Plans: Return home with wife who can provide supervision-minimal assist due to her RA. Wife, son and daughter in-law will be here throughout his stay to provide support and see his progress while here. Care Coordinator Anticipated Follow Up Needs: HH/OP  Clinical Impression Pt is motivated to do well and recover from his hip fracture. His wife, son and daughter in-law are very involved and will assist if needed. Aware being evaluated and goals being set today for stay here. Will work on discharge needs.  Lucy Chris 08/17/2022, 11:15 AM

## 2022-08-17 NOTE — Progress Notes (Signed)
Inpatient Rehabilitation Center Individual Statement of Services  Patient Name:  RONDLE SEHGAL  Date:  08/17/2022  Welcome to the Inpatient Rehabilitation Center.  Our goal is to provide you with an individualized program based on your diagnosis and situation, designed to meet your specific needs.  With this comprehensive rehabilitation program, you will be expected to participate in at least 3 hours of rehabilitation therapies Monday-Friday, with modified therapy programming on the weekends.  Your rehabilitation program will include the following services:  Physical Therapy (PT), Occupational Therapy (OT), 24 hour per day rehabilitation nursing, Therapeutic Recreaction (TR), Care Coordinator, Rehabilitation Medicine, Nutrition Services, and Pharmacy Services  Weekly team conferences will be held on Tuesday to discuss your progress.  Your Inpatient Rehabilitation Care Coordinator will talk with you frequently to get your input and to update you on team discussions.  Team conferences with you and your family in attendance may also be held.  Expected length of stay: 10-12 Days  Overall anticipated outcome: Independent with device-min car transfers  Depending on your progress and recovery, your program may change. Your Inpatient Rehabilitation Care Coordinator will coordinate services and will keep you informed of any changes. Your Inpatient Rehabilitation Care Coordinator's name and contact numbers are listed  below.  The following services may also be recommended but are not provided by the Inpatient Rehabilitation Center:  Driving Evaluations Home Health Rehabiltiation Services Outpatient Rehabilitation Services    Arrangements will be made to provide these services after discharge if needed.  Arrangements include referral to agencies that provide these services.  Your insurance has been verified to be:  [employer] and [employer] Your primary doctor is:  [place] in [place]  Pertinent  information will be shared with your doctor and your insurance company.  Inpatient Rehabilitation Care Coordinator:  Dossie Der, [place] [phone] or [place]  Information discussed with and copy given to patient by: Lucy Chris, 08/17/2022, 11:16 AM

## 2022-08-17 NOTE — Progress Notes (Signed)
Inpatient Rehabilitation  Patient information reviewed and entered into eRehab system by Javayah Magaw M. Naviah Belfield, M.A., CCC/SLP, PPS Coordinator.  Information including medical coding, functional ability and quality indicators will be reviewed and updated through discharge.    

## 2022-08-17 NOTE — Discharge Instructions (Addendum)
Inpatient Rehab Discharge Instructions  Marco Acevedo Discharge date and time: 08/31/2022  Activities/Precautions/ Functional Status: Activity: no lifting, driving, or strenuous exercise until cleared by MD; weight bear as tolerated on left leg Diet: cardiac diet Wound Care: keep wound clean and dry Functional status:  ___ No restrictions     ___ Walk up steps independently ___ 24/7 supervision/assistance   ___ Walk up steps with assistance __x_ Intermittent supervision/assistance  ___ Bathe/dress independently ___ Walk with walker     ___ Bathe/dress with assistance ___ Walk Independently    ___ Shower independently ___ Walk with assistance    _x__ Shower with assistance _x__ No alcohol     ___ Return to work/school ________  Special Instructions: No driving, alcohol consumption or tobacco use.   COMMUNITY REFERRALS UPON DISCHARGE:    Home Health:   PT     OT   RN                 Agency: [facility]    Phone: [phone]  Medical Equipment/Items Ordered: WHEELCHAIR, ROLLATOR, 3 IN 1                                                 [facility]Supplier[facility]  AWARE WHEELCHAIR AND 3 IN 1 WILL BE DELIVERED TO HOME-HAS ALREADY RECEIVED THE ROLLATOR THAT WAS ORDERED EARLIER   My questions have been answered and I understand these instructions. I will adhere to these goals and the provided educational materials after my discharge from the hospital.  Patient/Caregiver Signature _______________________________ Date __________  Clinician Signature _______________________________________ Date __________  Please bring this form and your medication list with you to all your follow-up doctor's appointments.

## 2022-08-17 NOTE — Discharge Summary (Signed)
Physician Discharge Summary  Patient ID: Marco Acevedo MRN: [id number] DOB/AGE: 1934-10-03 88 y.o.  Admit date: 08/16/2022 Discharge date: 08/31/2022  Discharge Diagnoses:  Principal Problem:   Other fracture of left femur, initial encounter for closed fracture [facility]) Active Problems:   Coping style affecting medical condition Active problems: Functional deficits secondary to left femur fracture Hypertension Hyperlipidemia Benign prostatic hypertrophy History of gout Coronary artery disease Gastroesophageal reflux disease Hiatal hernia Obstructive sleep apnea Diabetes mellitus type 2 Chronic diastolic congestive heart failure Acute blood loss anemia atop chronic anemia Skin rash Tremors Constipation Bladder incontinence Cough Hypoxia   Discharged Condition: stable  Significant Diagnostic Studies:  Narrative & Impression  CLINICAL DATA:  Shortness of breath when lying down   EXAM: NUCLEAR MEDICINE PERFUSION LUNG SCAN   TECHNIQUE: Perfusion images were obtained in multiple projections after intravenous injection of radiopharmaceutical.   Ventilation scans intentionally deferred if perfusion scan and chest x-ray adequate for interpretation during COVID 19 epidemic.   RADIOPHARMACEUTICALS:  3.8 mCi Tc-37m MAA IV   COMPARISON:  Chest x-ray 08/28/2022 and older   FINDINGS: Few small subsegmental peripheral defects identified. Chest x-ray has some minimal left basilar opacity.   IMPRESSION: Very low probability perfusion lung scan.     Electronically Signed   By: Karen Kays M.D.   On: 08/29/2022 10:54      Narrative & Impression  CLINICAL DATA:  Shortness of breath.   EXAM: CHEST - 2 VIEW   COMPARISON:  08/25/2022   FINDINGS: Cardiopericardial silhouette is at upper limits of normal for size. Right lung clear. Retrocardiac left base atelectasis or infiltrate noted. No pleural effusion. No acute bony abnormality.   IMPRESSION: Retrocardiac left  base atelectasis or infiltrate.     Electronically Signed   By: Kennith Center M.D.   On: 08/28/2022 12:11    Narrative & Impression  CLINICAL DATA:  Cough   EXAM: PORTABLE CHEST 1 VIEW   COMPARISON:  08/20/2022   FINDINGS: Lungs are well expanded, symmetric, and clear. No pneumothorax or pleural effusion. Cardiac size within normal limits. Pulmonary vascularity is normal. Osseous structures are age-appropriate. No acute bone abnormality.   IMPRESSION: No active disease.     Electronically Signed   By: Helyn Numbers M.D.   On: 08/25/2022 23:34   Narrative & Impression  CLINICAL DATA:  [id number] S/p left hip fracture [id number]   EXAM: PELVIS - 1-2 VIEW   COMPARISON:  CT 08/14/2022   FINDINGS: Postsurgical changes of flexor proximal femur ORIF for an intertrochanteric fracture. Hardware is intact without evidence of loosening. The far distal tip is excluded by collimation. Expected soft tissue changes. Vascular calcifications. No other acute fracture identified. Mild degenerative changes of the hips.   IMPRESSION: Postsurgical changes of left proximal femur ORIF. No evidence of hardware complication.     Electronically Signed   By: Caprice Renshaw M.D.   On: 08/23/2022 11:01   Narrative & Impression  CLINICAL DATA:  Shortness of breath   EXAM: CHEST - 2 VIEW   COMPARISON:  08/10/2022   FINDINGS: Cardiac shadow is stable. Aortic calcifications are seen. Lungs are clear. No bony abnormality is noted.   IMPRESSION: No active cardiopulmonary disease.     Electronically Signed   By: Alcide Clever M.D.   On: 08/20/2022 20:07      Labs:  Basic Metabolic Panel: Recent Labs  Lab 08/28/22 0532  NA 136  K 4.0  CL 101  CO2 22  GLUCOSE 138*  BUN 47*  CREATININE 2.53*  CALCIUM 8.7*    CBC: Recent Labs  Lab 08/28/22 0532  WBC 7.8  HGB 9.7*  HCT 30.7*  MCV 91.1  PLT 306    CBG: Recent Labs  Lab 08/30/22 0641 08/30/22 1113 08/30/22 1645  08/30/22 2213 08/31/22 0626  GLUCAP 118* 183* 112* 130* 123*    Brief HPI:   Marco Acevedo is a 88 y.o. male presented to the [facility] ED on 08/10/2022 after a fall when he missed stepped on a stair and fell to his left side, Complained of severe left buttock pain and was unable to get up. X-rays showed a left hip fx and orthopedic surgery was consulted. He lives at home with his wife and does not use any assistive devices to ambulate. Trauma scan head and neck CT negative, left elbow negative for fracture or dislocation, positive left intertrochanter fracture. Cardiology consulted prior to surgical repair. 2D echo updated. Echocardiogram revealed mild to moderate aortic stenosis and regurgitation, normal ejection fraction and grade 1 diastolic dysfunction. Underwent IM nail/ORIF by Dr. Blanchie Dessert on 5/03. He is WBAT LLE. CT pelvis performed. No acute pelvic fracture. Tolerating diet.    Hospital Course: Marco Acevedo was admitted to rehab 08/16/2022 for inpatient therapies to consist of PT, ST and OT at least three hours five days a week. Past admission physiatrist, therapy team and rehab RN have worked together to provide customized collaborative inpatient rehab. Left hip and groin pain persisting and increased gabapentin to 600 mg q HS. Labs reviewed: BUN down slightly, but creatinine increased to 2.42. Vitamin D level normal. H and H improved. Discussed use of nitroglycerin for indigestion/chronic angina and patient confirmed this was prescribed for this by his cardiologist. Albuterol inhaler and IS given for SOB/upper airway congestion on 5/12. Chest x-ray negative. Timed voids for urinary incontinence. UA negative. Improvement in bladder control. Mild increase in BUN and Cr. Complained of increased left hip pain and x-ray obtained 5/15>>stable. Decreased SaO2 to 77-79 on 5/18 and given albuterol and O2 supplement 3L on [place]. Repeat CXR obtained>>no active disease. O2 sats dropped again into 80s and SOB/DOE  with physical therapy 5/18 and V-Q scan ordered due to CKD. Lovenox dosed per weight. Patient reports SOB on room air with PT on 5/19. SaO2 >90 % throughout session with finger probe.  Neuropsychology consult on 5/21. V-Q scan with very low probability and Lovenox dose adjusted for DVT prophylaxis. Cough and SOB continued to improve. Continue albuterol inhaler/nebs as prescribed at home. Incisions healing nicely.  Blood pressures were monitored on TID basis and amlodipine was decreased to 2.5 mg daily due to hypotension. Norvasc discontinued on 5/12. Coreg 3.125 mg BID and Imdur 120 mg daily continued. Explained would not restart amlodipine or losartan until follow-up with PCP/cardiologist.  Diabetes has been monitored with ac/hs CBG checks and SSI was use prn for tighter BS control. Home glipizide NOT restarted due to CKD/elevated BUN and creatinine. Restarted Farxiga 10 mg daily on 5/09.   Rehab course: During patient's stay in rehab weekly team conferences were held to monitor patient's progress, set goals and discuss barriers to discharge. At admission, patient required max with basic self-care skills and IADL and  min with mobility.  He has had improvement in activity tolerance, balance, postural control as well as ability to compensate for deficits. He has had improvement in functional use RUE/LUE  and RLE/LLE as well as improvement in awareness.  Pt ambulates into/out off bathroom and performs  3/3 toileting activities with close supervision + RW. Pt returns to bed with Mod A for BLE elevation in preparation for x-ray on 5/20.    Discharge disposition: 01-Home or Self Care     Diet: carb modified  Special Instructions: No driving, alcohol consumption or tobacco use.  Follow-up with PCP in 1-2 weeks.  Recommend checking fingerstick blood sugars four (4) times daily and record. Bring this information with you to follow-up appointment with PCP.  Recommend daily BP measurement in same arm  and record time of day. Bring this information with you to follow-up appointment with PCP.  30-35 minutes were spent on discharge planning and discharge summary.  Discharge Instructions     Discharge patient   Complete by: As directed    Discharge disposition: 01-Home or Self Care   Discharge patient date: 08/31/2022      Allergies as of 08/31/2022       Reactions   Cimetidine Other (See Comments)   Anxiety Attacks   Metformin Hcl    Other reaction(s): decreased kidney function        Medication List     STOP taking these medications    amLODipine 5 MG tablet Commonly known as: NORVASC   docusate sodium 100 MG capsule Commonly known as: COLACE   glipiZIDE 5 MG tablet Commonly known as: GLUCOTROL   losartan 25 MG tablet Commonly known as: COZAAR       TAKE these medications    acetaminophen 325 MG tablet Commonly known as: TYLENOL Take 1-2 tablets (325-650 mg total) by mouth every 4 (four) hours as needed for mild pain.   albuterol 108 (90 Base) MCG/ACT inhaler Commonly known as: VENTOLIN HFA Inhale 2 puffs into the lungs every 6 (six) hours as needed for wheezing or shortness of breath. What changed: Another medication with the same name was removed. Continue taking this medication, and follow the directions you see here.   allopurinol 100 MG tablet Commonly known as: ZYLOPRIM Take 100 mg by mouth daily.   aspirin EC 81 MG tablet Take 1 tablet (81 mg total) by mouth daily.   atorvastatin 80 MG tablet Commonly known as: LIPITOR Take 1 tablet (80 mg total) by mouth daily at 6 PM.   capsaicin 0.025 % cream Commonly known as: ZOSTRIX Apply topically 2 (two) times daily.   carvedilol 3.125 MG tablet Commonly known as: COREG Take 3.125 mg by mouth 2 (two) times daily with a meal.   cholecalciferol 25 MCG (1000 UNIT) tablet Commonly known as: VITAMIN D3 Take 1,000 Units by mouth daily.   clopidogrel 75 MG tablet Commonly known as: PLAVIX Take 1  tablet (75 mg total) by mouth daily.   cyanocobalamin 1000 MCG tablet Commonly known as: VITAMIN B12 Take 1,000 mcg by mouth daily.   dapagliflozin propanediol 10 MG Tabs tablet Commonly known as: Farxiga Take 1 tablet (10 mg total) by mouth daily before breakfast.   finasteride 5 MG tablet Commonly known as: PROSCAR Take 1 tablet (5 mg total) by mouth daily.   gabapentin 300 MG capsule Commonly known as: NEURONTIN Take 2 capsules (600 mg total) by mouth at bedtime. What changed: how much to take   guaifenesin 400 MG Tabs tablet Commonly known as: HUMIBID E Take 400 mg by mouth at bedtime.   Iron 325 (65 Fe) MG Tabs Take 325 mg by mouth daily.   isosorbide mononitrate 120 MG 24 hr tablet Commonly known as: IMDUR Take 1 tablet (120 mg total) by mouth  daily.   nitroGLYCERIN 0.4 MG SL tablet Commonly known as: NITROSTAT Place 0.4 mg under the tongue every 5 (five) minutes as needed for chest pain.   oxyCODONE 5 MG immediate release tablet Commonly known as: Oxy IR/ROXICODONE Take 1 tablet (5 mg total) by mouth every 6 (six) hours as needed for severe pain or breakthrough pain.   pantoprazole 40 MG tablet Commonly known as: PROTONIX Take 40 mg by mouth daily.   polyethylene glycol 17 g packet Commonly known as: MIRALAX / GLYCOLAX Take 17 g by mouth every other day. Start taking on: Sep 01, 2022   primidone 50 MG tablet Commonly known as: MYSOLINE Take 50 mg by mouth at bedtime.   sucralfate 1 GM/10ML suspension Commonly known as: CARAFATE Take 1 g by mouth 2 (two) times daily.   traMADol 50 MG tablet Commonly known as: ULTRAM Take 1 tablet (50 mg total) by mouth at bedtime as needed for moderate pain. What changed:  when to take this additional instructions   traZODone 50 MG tablet Commonly known as: DESYREL Take 0.5-1 tablets (25-50 mg total) by mouth at bedtime as needed for sleep.        Follow-up Information     Daisy Floro, MD Follow up.    Specialty: Family Medicine Why: Call the office in 1-2 days to make arrangements for hospital follow-up appointment. Contact information: [address] [place] [address] [phone]         Joen Laura, MD Follow up.   Specialty: Orthopedic Surgery Why: Call the office in 1-2 days to make arrangements for hospital follow-up appointment. Contact information: [address] [place] [address] [phone]         Jake Bathe, MD. Go to.   Specialty: Cardiology Why: Appointment arranged with NP Contact information: [address] [place] [address] [phone]         Genice Rouge, MD Follow up.   Specialty: Physical Medicine and Rehabilitation Why: As needed Contact information: [address] [place] [address] [phone]                 Signed: Milinda Antis 08/31/2022, 10:46 AM

## 2022-08-17 NOTE — Progress Notes (Signed)
   08/17/22 1200  Spiritual Encounters  Type of Visit Initial  Care provided to: Patient  Referral source Patient request  Reason for visit Advance directives  OnCall Visit No   Ch responded to request for AD. There was no family at bedside. Ch assisted pt with AD education. After education, pt declined AD. No follow-up needed at this time.

## 2022-08-17 NOTE — Progress Notes (Signed)
Physical Therapy Assessment and Plan  Patient Details  Name: Marco Acevedo MRN: [id number] Date of Birth: [birth year]  PT Diagnosis: Difficulty walking, Impaired sensation, Low back pain, and Muscle weakness Rehab Potential: Good ELOS: 10-12 days   Today's Date: 08/17/2022 PT Individual Time: 0900-1020 PT Individual Time Calculation (min): 80 min    Hospital Problem: Principal Problem:   Other fracture of left femur, initial encounter for closed fracture [facility])   Past Medical History:  Past Medical History:  Diagnosis Date   Anemia    low iron   Aortic stenosis    Arthritis    BPH (benign prostatic hyperplasia)    CAD (coronary artery disease)    Chronic diastolic CHF (congestive heart failure) (HCC)    CKD (chronic kidney disease), stage IV (HCC)    Claustrophobia    Complication of anesthesia    has to have head elevated to keep from strangling on post nasal drip   COPD (chronic obstructive pulmonary disease) (HCC)    Diabetes mellitus type II    type 2   GERD (gastroesophageal reflux disease)    Gout    Granuloma annulare    History of hiatal hernia    History of kidney stones    Litthotrispy   Hyperlipidemia    Hypertension    Neuropathy    Obesity    OSA (obstructive sleep apnea)    Stroke (HCC) 10/2017   Weakness right arm and leg   Past Surgical History:  Past Surgical History:  Procedure Laterality Date   BLEPHAROPLASTY     CARDIAC CATHETERIZATION  7829,5621   X 2 stents   CHOLECYSTECTOMY N/A 09/23/2014   Procedure: LAPAROSCOPIC CHOLECYSTECTOMY WITH INTRAOPERATIVE CHOLANGIOGRAM;  Surgeon: Chevis Pretty [name], MD;  Location: [facility] OR;  Service: General;  Laterality: N/A;   COLONOSCOPY     COLONOSCOPY WITH PROPOFOL N/A 03/22/2016   Procedure: COLONOSCOPY WITH PROPOFOL;  Surgeon: Charlott Rakes, MD;  Location: [facility] ENDOSCOPY;  Service: Endoscopy;  Laterality: N/A;   ENDARTERECTOMY Left 10/19/2017   Procedure: ENDARTERECTOMY CAROTID LEFT;  Surgeon: Larina Earthly, MD;   Location: [facility] OR;  Service: Vascular;  Laterality: Left;   ESOPHAGOGASTRODUODENOSCOPY (EGD) WITH PROPOFOL N/A 03/22/2016   Procedure: ESOPHAGOGASTRODUODENOSCOPY (EGD) WITH PROPOFOL;  Surgeon: Charlott Rakes, MD;  Location: [facility] ENDOSCOPY;  Service: Endoscopy;  Laterality: N/A;   EYE SURGERY     both cataracts   INTRAMEDULLARY (IM) NAIL INTERTROCHANTERIC Left 08/11/2022   Procedure: INTRAMEDULLARY (IM) NAIL INTERTROCHANTERIC;  Surgeon: Joen Laura, MD;  Location: [facility] OR;  Service: Orthopedics;  Laterality: Left;   KNEE ARTHROSCOPY WITH MEDIAL MENISECTOMY Right 08/19/2013   Procedure: RIGHT KNEE ARTHROSCOPY WITH PARTIAL MEDIAL MENISECTOMY AND CHONDROPLASTY;  Surgeon: Velna Ochs, MD;  Location: [facility];  Service: Orthopedics;  Laterality: Right;   LEFT HEART CATH AND CORONARY ANGIOGRAPHY N/A 06/28/2016   Procedure: Left Heart Cath and Coronary Angiography;  Surgeon: Lyn Records, MD;  Location: [facility] INVASIVE CV LAB;  Service: Cardiovascular;  Laterality: N/A;   LEFT HEART CATH AND CORONARY ANGIOGRAPHY N/A 02/25/2019   Procedure: LEFT HEART CATH AND CORONARY ANGIOGRAPHY;  Surgeon: Lyn Records, MD;  Location: [facility] INVASIVE CV LAB;  Service: Cardiovascular;  Laterality: N/A;   LEFT HEART CATHETERIZATION WITH CORONARY ANGIOGRAM N/A 10/03/2011   Procedure: LEFT HEART CATHETERIZATION WITH CORONARY ANGIOGRAM;  Surgeon: Lesleigh Noe, MD;  Location: [facility] CATH LAB;  Service: Cardiovascular;  Laterality: N/A;   LEFT HEART CATHETERIZATION WITH CORONARY ANGIOGRAM N/A 09/12/2012  Procedure: LEFT HEART CATHETERIZATION WITH CORONARY ANGIOGRAM;  Surgeon: Lesleigh Noe, MD;  Location: [facility] CATH LAB;  Service: Cardiovascular;  Laterality: N/A;   LIPOMA EXCISION     Biospy only; left parotid gland   LITHOTRIPSY     none     PERCUTANEOUS CORONARY STENT INTERVENTION (PCI-S) N/A 09/17/2012   Procedure: PERCUTANEOUS CORONARY STENT INTERVENTION (PCI-S);  Surgeon: Lesleigh Noe, MD;   Location: [facility] CATH LAB;  Service: Cardiovascular;  Laterality: N/A;   TRIGGER FINGER RELEASE Left 12/21/2015   Procedure: LEFT LONG FINGER TRIGGER RELEASE ;  Surgeon: Mack Hook, MD;  Location: [facility];  Service: Orthopedics;  Laterality: Left;    Assessment & Plan Clinical Impression: Patient is a 88 y.o. year old male who presented to the [facility] ED on 08/10/2022 after a fall when he missed stepped on a stair and fell to his left side, Complained of severe left buttock pain and was unable to get up. X-rays showed a left hip fx and orthopedic surgery was consulted. He lives at home with his wife and does not use any assistive devices to ambulate. Trauma scan head and neck CT negative, left elbow negative for fracture or dislocation, positive left intertrochanter fracture. Cardiology consulted prior to surgical repair. 2D echo updated. Echocardiogram revealed mild to moderate aortic stenosis and regurgitation, normal ejection fraction and grade 1 diastolic dysfunction. Underwent IM nail/ORIF by Dr. Blanchie Dessert on 5/03. He is WBAT LLE. CT pelvis performed. No acute pelvic fracture. Tolerating diet. The patient requires inpatient medicine and rehabilitation evaluations and services for ongoing dysfunction secondary to left hip fracture.   Ambulating over 100 feet. He goes to the gym 2 days a week doing many strength training exercises and plays golf regularly as well. He is very motivated to return to his active lifestyle.    Patient transferred to CIR on 08/16/2022 .   Patient currently requires min with mobility secondary to muscle weakness, decreased cardiorespiratoy endurance, and decreased standing balance, decreased postural control, and decreased balance strategies.  Prior to hospitalization, patient was independent  with mobility and lived with Spouse in a Other(Comment) (condo) home.  Home access is 1 (threshold)Stairs to enter.  Patient will benefit from skilled PT intervention to  maximize safe functional mobility, minimize fall risk, and decrease caregiver burden for planned discharge home with intermittent assist.  Anticipate patient will benefit from follow up [facility] at discharge.  PT - End of Session Activity Tolerance: Tolerates 30+ min activity with multiple rests Endurance Deficit: Yes Endurance Deficit Description: decreased PT Assessment Rehab Potential (ACUTE/IP ONLY): Good PT Barriers to Discharge: Decreased caregiver support;Home environment access/layout PT Barriers to Discharge Comments: condo, 1 level with threshold to enter PT Patient demonstrates impairments in the following area(s): Balance;Pain;Sensory;Motor;Endurance PT Transfers Functional Problem(s): Bed Mobility;Bed to Chair;Car PT Locomotion Functional Problem(s): Ambulation PT Plan PT Intensity: Minimum of 1-2 x/day ,45 to 90 minutes PT Frequency: 5 out of 7 days PT Duration Estimated Length of Stay: 10-12 days PT Treatment/Interventions: Ambulation/gait training;Discharge planning;DME/adaptive equipment instruction;Functional mobility training;Pain management;Psychosocial support;Therapeutic Activities;UE/LE Strength taining/ROM;UE/LE Coordination activities;Therapeutic Exercise;Stair training;Patient/family education;Community reintegration;Balance/vestibular training PT Transfers Anticipated Outcome(s): Mod I PT Locomotion Anticipated Outcome(s): Mod I PT Recommendation Recommendations for Other Services:  (TBD) Follow Up Recommendations: Home health PT Patient destination: Home Equipment Recommended: Rolling walker with 5" wheels Equipment Details: would benefit from RW   PT Evaluation Precautions/Restrictions Precautions Precautions: Fall Precaution Comments: WBAT, no precautions Restrictions Weight Bearing Restrictions: Yes LLE Weight Bearing:  Weight bearing as tolerated General   Vital SignsTherapy Vitals Temp: 98.2 F (36.8 C) Temp Source: Oral Pulse Rate: 78 Resp: 16 BP:  (Abnormal) 140/78 Patient Position (if appropriate): Lying Oxygen Therapy SpO2: 96 % O2 Device: Room Air Pain Pain Assessment Pain Scale: 0-10 Pain Score: 10-Worst pain ever Pain Type: Surgical pain Pain Location: Hip Pain Orientation: Left Pain Descriptors / Indicators: Aching Pain Frequency: Occasional Pain Onset: On-going Pain Intervention(s): Medication (See eMAR) Pain Interference Pain Interference Pain Effect on Sleep: 0. Does not apply - I have not had any pain or hurting in the past 5 days Pain Interference with Therapy Activities: 3. Frequently Pain Interference with Day-to-Day Activities: 3. Frequently Home Living/Prior Functioning Home Living Available Help at Discharge: Family;Friend(s);Available PRN/intermittently Type of Home: Other(Comment) (condo) Home Access: Stairs to enter Entrance Stairs-Number of Steps: 1 (threshold) Entrance Stairs-Rails: None Home Layout: One level Bathroom Shower/Tub: Health visitor: Standard Bathroom Accessibility: Yes  Lives With: Spouse Prior Function Level of Independence: Independent with gait;Independent with transfers  Able to Take Stairs?: Yes Driving: Yes Vocation: Retired Museum/gallery exhibitions officer Overall Cognitive Status: Within Functional Limits for tasks assessed Arousal/Alertness: Awake/alert Orientation Level: Oriented X4 Memory: Appears intact Awareness: Appears intact Problem Solving: Appears intact Safety/Judgment: Appears intact Sensation Sensation Light Touch: Impaired by gross assessment Additional Comments: B LE neuropathy Coordination Gross Motor Movements are Fluid and Coordinated: No Fine Motor Movements are Fluid and Coordinated: Yes Coordination and Movement Description: grossly uncoordinated 2/2 pain and weakness Finger Nose Finger Test: [facility] [place] Test: unable to perform L LE, intact R LE Motor  Motor Motor: Other (comment) (Grossly uncoordinated with generalized  weakness) Motor - Skilled Clinical Observations: Generalized weakness and grossly uncoordinated   Trunk/Postural Assessment  Cervical Assessment Cervical Assessment: Within Functional Limits Thoracic Assessment Thoracic Assessment: Within Functional Limits Lumbar Assessment Lumbar Assessment: Within Functional Limits Postural Control Postural Control: Within Functional Limits  Balance Balance Balance Assessed: Yes Static Sitting Balance Static Sitting - Balance Support: Feet supported Static Sitting - Level of Assistance: 6: Modified independent (Device/Increase time) Dynamic Sitting Balance Dynamic Sitting - Balance Support: Feet supported;During functional activity Dynamic Sitting - Level of Assistance: 6: Modified independent (Device/Increase time) Static Standing Balance Static Standing - Balance Support: Bilateral upper extremity supported;During functional activity Static Standing - Level of Assistance: 5: Stand by assistance (CGA) Dynamic Standing Balance Dynamic Standing - Balance Support: Bilateral upper extremity supported Dynamic Standing - Level of Assistance: 5: Stand by assistance (CGA) Extremity Assessment      RLE Assessment RLE Assessment: Exceptions to [facility] General Strength Comments: Grossly 4/5 LLE Assessment LLE Assessment: Exceptions to [facility] General Strength Comments: Grossly 2/5  Care Tool Care Tool Bed Mobility Roll left and right activity Roll left and right activity did not occur: Safety/medical concerns (pain)      Sit to lying activity Sit to lying activity did not occur: Safety/medical concerns (pain)      Lying to sitting on side of bed activity Lying to sitting on side of bed activity did not occur: the ability to move from lying on the back to sitting on the side of the bed with no back support.: Safety/medical concerns (pain)       Care Tool Transfers Sit to stand transfer   Sit to stand assist level: Contact Guard/Touching assist     Chair/bed transfer   Chair/bed transfer assist level: Minimal Assistance - Patient > 75%     Toilet transfer Toilet transfer activity  did not occur: Safety/medical concerns (pain)      Car transfer   Car transfer assist level: Maximal Assistance - Patient 25 - 49%      Care Tool Locomotion Ambulation   Assist level: Contact Guard/Touching assist   Max distance: 20 ft  Walk 10 feet activity   Assist level: Contact Guard/Touching assist Assistive device: Walker-rolling   Walk 50 feet with 2 turns activity Walk 50 feet with 2 turns activity did not occur: Safety/medical concerns (pain, weakness)      Walk 150 feet activity Walk 150 feet activity did not occur: Safety/medical concerns      Walk 10 feet on uneven surfaces activity   Assist level: Contact Guard/Touching assist    Stairs   Assist level: Moderate Assistance - Patient - 50 - 74% Stairs assistive device: 2 hand rails Max number of stairs: 8 (3 inches)  Walk up/down 1 step activity   Walk up/down 1 step (curb) assist level: Moderate Assistance - Patient - 50 - 74% Walk up/down 1 step or curb assistive device: 2 hand rails  Walk up/down 4 steps activity   Walk up/down 4 steps assist level: Moderate Assistance - Patient - 50 - 74% Walk up/down 4 steps assistive device: 2 hand rails  Walk up/down 12 steps activity Walk up/down 12 steps activity did not occur: Safety/medical concerns (weakness)      Pick up small objects from floor Pick up small object from the floor (from standing position) activity did not occur: Safety/medical concerns (pain)      Wheelchair Is the patient using a wheelchair?: Yes Type of Wheelchair: Manual   Wheelchair assist level: Supervision/Verbal cueing Max wheelchair distance: 150 ft  Wheel 50 feet with 2 turns activity   Assist Level: Supervision/Verbal cueing  Wheel 150 feet activity   Assist Level: Supervision/Verbal cueing    Refer to Care Plan for Long Term Goals  SHORT TERM  GOAL WEEK 1 PT Short Term Goal 1 (Week 1): Pt will require supervision for dynamic standing balance with LRAD PT Short Term Goal 2 (Week 1): Pt will require min A for stair negotiation PT Short Term Goal 3 (Week 1): Pt will require supervision for gait 50 ft with LRAD  Recommendations for other services: None   Skilled Therapeutic Intervention Mobility Bed Mobility Bed Mobility: Not assessed Transfers Transfers: Sit to Stand;Stand to Sit Sit to Stand: Contact Guard/Touching assist Stand to Sit: Contact Guard/Touching assist Transfer (Assistive device): Rolling walker Locomotion  Gait Ambulation: Yes Gait Assistance: Contact Guard/Touching assist Gait Distance (Feet): 20 Feet Assistive device: Rolling walker Gait Gait: Yes Gait Pattern: Impaired Gait Pattern: Step-to pattern;Antalgic Gait velocity: decrased Stairs / Additional Locomotion Stairs: Yes Stairs Assistance: Moderate Assistance - Patient 50 - 74% Stair Management Technique: Two rails Number of Stairs: 8 Height of Stairs: 3 (inches) Ramp: Contact Guard/touching assist Curb: Moderate Assistance - Patient 50 - 74% Wheelchair Mobility Wheelchair Mobility: Yes Wheelchair Assistance: Doctor, general practice: Both upper extremities Wheelchair Parts Management: Needs assistance Distance: 150 ft  Physical Therapy Evaluation:   Pt received seated edge of bed and agreeable to PT evaluation. Pt oriented to CIR policies and procedures. PT assessed pain interference, sensation, coordination, strength and balance. Pt requires CGA with sit to stand with RW and with ambulatory transfer to w/c. Pt (S) for w/c propulsion ~150 ft and transported remaining distance to ortho gym. Pt performed car transfer with max A and demonstrates difficulty with L LE management due to pain. Pt  requires CGA with ramp negotiation and mod A with steps x 8 (3 inches) with 2 HR's. Pt requires physical assist to descend steps due  to fatigue. Pt transported to room and left seated in recliner at bedside with all needs in reach and chair alarm on. Pt largely limited due to L LE pain, provided rest and repositioning for relief.   Discharge Criteria: Patient will be discharged from PT if patient refuses treatment 3 consecutive times without medical reason, if treatment goals not met, if there is a change in medical status, if patient makes no progress towards goals or if patient is discharged from hospital.  The above assessment, treatment plan, treatment alternatives and goals were discussed and mutually agreed upon: by patient  Priscille Kluver PT, DPT  08/17/2022, 3:42 PM

## 2022-08-17 NOTE — Evaluation (Signed)
Occupational Therapy Assessment and Plan  Patient Details  Name: Marco Acevedo MRN: [id number] Date of Birth: 11/10/1934  OT Diagnosis: muscle weakness (generalized), pain in joint, and swelling of limb Rehab Potential: Rehab Potential (ACUTE ONLY): Good ELOS: 10-12 d   Today's Date: 08/17/2022 OT Individual Time: 1045-1200 75 min 1st Session, 1400-1500 60 min 2nd Session  OT Individual Time Calculation (min): 60 min     Hospital Problem: Principal Problem:   Other fracture of left femur, initial encounter for closed fracture [facility])   Past Medical History:  Past Medical History:  Diagnosis Date   Anemia    low iron   Aortic stenosis    Arthritis    BPH (benign prostatic hyperplasia)    CAD (coronary artery disease)    Chronic diastolic CHF (congestive heart failure) (HCC)    CKD (chronic kidney disease), stage IV (HCC)    Claustrophobia    Complication of anesthesia    has to have head elevated to keep from strangling on post nasal drip   COPD (chronic obstructive pulmonary disease) (HCC)    Diabetes mellitus type II    type 2   GERD (gastroesophageal reflux disease)    Gout    Granuloma annulare    History of hiatal hernia    History of kidney stones    Litthotrispy   Hyperlipidemia    Hypertension    Neuropathy    Obesity    OSA (obstructive sleep apnea)    Stroke (HCC) 10/2017   Weakness right arm and leg   Past Surgical History:  Past Surgical History:  Procedure Laterality Date   BLEPHAROPLASTY     CARDIAC CATHETERIZATION  4098,1191   X 2 stents   CHOLECYSTECTOMY N/A 09/23/2014   Procedure: LAPAROSCOPIC CHOLECYSTECTOMY WITH INTRAOPERATIVE CHOLANGIOGRAM;  Surgeon: Chevis Pretty [name], MD;  Location: [facility] OR;  Service: General;  Laterality: N/A;   COLONOSCOPY     COLONOSCOPY WITH PROPOFOL N/A 03/22/2016   Procedure: COLONOSCOPY WITH PROPOFOL;  Surgeon: Charlott Rakes, MD;  Location: [facility] ENDOSCOPY;  Service: Endoscopy;  Laterality: N/A;   ENDARTERECTOMY Left  10/19/2017   Procedure: ENDARTERECTOMY CAROTID LEFT;  Surgeon: Larina Earthly, MD;  Location: [facility] OR;  Service: Vascular;  Laterality: Left;   ESOPHAGOGASTRODUODENOSCOPY (EGD) WITH PROPOFOL N/A 03/22/2016   Procedure: ESOPHAGOGASTRODUODENOSCOPY (EGD) WITH PROPOFOL;  Surgeon: Charlott Rakes, MD;  Location: [facility] ENDOSCOPY;  Service: Endoscopy;  Laterality: N/A;   EYE SURGERY     both cataracts   INTRAMEDULLARY (IM) NAIL INTERTROCHANTERIC Left 08/11/2022   Procedure: INTRAMEDULLARY (IM) NAIL INTERTROCHANTERIC;  Surgeon: Joen Laura, MD;  Location: [facility] OR;  Service: Orthopedics;  Laterality: Left;   KNEE ARTHROSCOPY WITH MEDIAL MENISECTOMY Right 08/19/2013   Procedure: RIGHT KNEE ARTHROSCOPY WITH PARTIAL MEDIAL MENISECTOMY AND CHONDROPLASTY;  Surgeon: Velna Ochs, MD;  Location:  SURGERY CENTER;  Service: Orthopedics;  Laterality: Right;   LEFT HEART CATH AND CORONARY ANGIOGRAPHY N/A 06/28/2016   Procedure: Left Heart Cath and Coronary Angiography;  Surgeon: Lyn Records, MD;  Location: [facility] INVASIVE CV LAB;  Service: Cardiovascular;  Laterality: N/A;   LEFT HEART CATH AND CORONARY ANGIOGRAPHY N/A 02/25/2019   Procedure: LEFT HEART CATH AND CORONARY ANGIOGRAPHY;  Surgeon: Lyn Records, MD;  Location: [facility] INVASIVE CV LAB;  Service: Cardiovascular;  Laterality: N/A;   LEFT HEART CATHETERIZATION WITH CORONARY ANGIOGRAM N/A 10/03/2011   Procedure: LEFT HEART CATHETERIZATION WITH CORONARY ANGIOGRAM;  Surgeon: Lesleigh Noe, MD;  Location: [facility] CATH LAB;  Service: Cardiovascular;  Laterality: N/A;   LEFT HEART CATHETERIZATION WITH CORONARY ANGIOGRAM N/A 09/12/2012   Procedure: LEFT HEART CATHETERIZATION WITH CORONARY ANGIOGRAM;  Surgeon: Lesleigh Noe, MD;  Location: [facility] CATH LAB;  Service: Cardiovascular;  Laterality: N/A;   LIPOMA EXCISION     Biospy only; left parotid gland   LITHOTRIPSY     none     PERCUTANEOUS CORONARY STENT INTERVENTION (PCI-S) N/A 09/17/2012   Procedure:  PERCUTANEOUS CORONARY STENT INTERVENTION (PCI-S);  Surgeon: Lesleigh Noe, MD;  Location: [facility] CATH LAB;  Service: Cardiovascular;  Laterality: N/A;   TRIGGER FINGER RELEASE Left 12/21/2015   Procedure: LEFT LONG FINGER TRIGGER RELEASE ;  Surgeon: Mack Hook, MD;  Location: [facility];  Service: Orthopedics;  Laterality: Left;    Assessment & Plan Clinical Impression:   Marco Acevedo is an 88 year old male who presented to the [facility] ED on 08/10/2022 after a fall when he missed stepped on a stair and fell to his left side, Complained of severe left buttock pain and was unable to get up. X-rays showed a left hip fx and orthopedic surgery was consulted. He lives at home with his wife and does not use any assistive devices to ambulate. Trauma scan head and neck CT negative, left elbow negative for fracture or dislocation, positive left intertrochanter fracture. Cardiology consulted prior to surgical repair. 2D echo updated. Echocardiogram revealed mild to moderate aortic stenosis and regurgitation, normal ejection fraction and grade 1 diastolic dysfunction. Underwent IM nail/ORIF by Dr. Blanchie Dessert on 5/03. He is WBAT LLE. CT pelvis performed. No acute pelvic fracture. Tolerating diet. The patient requires inpatient medicine and rehabilitation evaluations and services for ongoing dysfunction secondary to left hip fracture.   Ambulating over 100 feet. He goes to the gym 2 days a week doing many strength training exercises and plays golf regularly as well. He is very motivated to return to his active lifestyle.  Patient currently requires max with basic self-care skills and IADL secondary to muscle weakness and muscle joint tightness, decreased cardiorespiratoy endurance, and decreased standing balance, decreased postural control, and decreased balance strategies.  Prior to hospitalization, patient could complete all aspect of A/IADL's incl driving and golf with independent level.   Patient  will benefit from skilled intervention to decrease level of assist with basic self-care skills, increase independence with basic self-care skills, and increase level of independence with iADL prior to discharge home with care partner.  Anticipate patient will require 24 hour supervision and follow up home health vs follow up outpatient.  OT - End of Session Activity Tolerance: Decreased this session Endurance Deficit: Yes Endurance Deficit Description: decreased OT Assessment Rehab Potential (ACUTE ONLY): Good OT Barriers to Discharge: Inaccessible home environment;Decreased caregiver support;Wound Care OT Barriers to Discharge Comments: new L hip wound, elderly wife, step to enter home OT Patient demonstrates impairments in the following area(s): Balance;Motor;Sensory;Skin Integrity;Pain;Edema;Endurance OT Basic ADL's Functional Problem(s): Grooming;Bathing;Dressing;Toileting OT Advanced ADL's Functional Problem(s): Simple Meal Preparation OT Transfers Functional Problem(s): Toilet;Tub/Shower OT Plan OT Intensity: Minimum of 1-2 x/day, 45 to 90 minutes OT Frequency: 5 out of 7 days OT Duration/Estimated Length of Stay: 10-12 d OT Treatment/Interventions: Balance/vestibular training;Community reintegration;Disease mangement/prevention;Functional electrical stimulation;Neuromuscular re-education;Patient/family education;Self Care/advanced ADL retraining;Splinting/orthotics;Therapeutic Exercise;UE/LE Coordination activities;Wheelchair propulsion/positioning;Discharge planning;DME/adaptive equipment instruction;Functional mobility training;Pain management;Psychosocial support;Skin care/wound managment;Therapeutic Activities;UE/LE Strength taining/ROM OT Self Feeding Anticipated Outcome(s): indep OT Basic Self-Care Anticipated Outcome(s): mod indep OT Toileting Anticipated Outcome(s): mod indep OT Bathroom Transfers Anticipated Outcome(s): mod indep  OT Recommendation Recommendations for Other  Services: Therapeutic Recreation consult Therapeutic Recreation Interventions: Outing/community reintergration Patient destination: Home Follow Up Recommendations: Home health OT;Outpatient OT Equipment Recommended: Rolling walker with 5" wheels;Tub/shower seat Equipment Details: home vs outpt depending on transportation   OT Evaluation Precautions/Restrictions  Precautions Precautions: Fall Precaution Comments: WBAT, no precautions Restrictions Weight Bearing Restrictions: Yes LLE Weight Bearing: Weight bearing as tolerated General Chart Reviewed: Yes Family/Caregiver Present: Yes Vital Signs Therapy Vitals Temp: 98.2 F (36.8 C) Temp Source: Oral Pulse Rate: 78 Resp: 16 BP: (!) 140/78 Patient Position (if appropriate): Lying Oxygen Therapy SpO2: 96 % O2 Device: Room Air Pain Pain Assessment Pain Scale: 0-10 Pain Score: 5  Pain Type: Surgical pain Pain Location: Hip Pain Orientation: Left Pain Descriptors / Indicators: Aching Pain Frequency: Constant Pain Onset: On-going Pain Intervention(s): Medication (See eMAR) Home Living/Prior Functioning Home Living Family/patient expects to be discharged to:: Private residence Living Arrangements: Spouse/significant other Vision Baseline Vision/History: 0 No visual deficits Ability to See in Adequate Light: 0 Adequate Patient Visual Report: No change from baseline Perception  Perception: Within Functional Limits Praxis Praxis: Intact Cognition Cognition Overall Cognitive Status: Within Functional Limits for tasks assessed Arousal/Alertness: Awake/alert Orientation Level: Person;Place;Situation Memory: Appears intact Awareness: Appears intact Problem Solving: Appears intact Safety/Judgment: Appears intact Brief Interview for Mental Status (BIMS) Repetition of Three Words (First Attempt): 3 Temporal Orientation: Year: Correct Temporal Orientation: Month: Accurate within 5 days Temporal Orientation: Day:  Correct Recall: "Sock": Yes, no cue required Recall: "Blue": Yes, no cue required Recall: "Bed": Yes, no cue required BIMS Summary Score: 15 Sensation Sensation Light Touch: Impaired by gross assessment Hot/Cold: Appears Intact Stereognosis: Appears Intact Additional Comments: B LE neuropathy Coordination Gross Motor Movements are Fluid and Coordinated: No Fine Motor Movements are Fluid and Coordinated: Yes Coordination and Movement Description: grossly uncoordinated 2/2 pain and weakness Finger Nose Finger Test: [facility] Heel Shin Test: unable to perform L LE, intact R LE Motor  Motor Motor: Other (comment) Motor - Skilled Clinical Observations: Generalized weakness and grossly uncoordinated  Trunk/Postural Assessment  Cervical Assessment Cervical Assessment: Within Functional Limits Thoracic Assessment Thoracic Assessment: Within Functional Limits Lumbar Assessment Lumbar Assessment: Within Functional Limits Postural Control Postural Control: Within Functional Limits  Balance Balance Balance Assessed: Yes Static Sitting Balance Static Sitting - Balance Support: Feet supported Static Sitting - Level of Assistance: 6: Modified independent (Device/Increase time) Dynamic Sitting Balance Dynamic Sitting - Balance Support: Feet supported;During functional activity Dynamic Sitting - Level of Assistance: 6: Modified independent (Device/Increase time) Static Standing Balance Static Standing - Balance Support: Bilateral upper extremity supported;During functional activity Static Standing - Level of Assistance: 5: Stand by assistance Dynamic Standing Balance Dynamic Standing - Balance Support: Bilateral upper extremity supported Dynamic Standing - Level of Assistance: 4: Min assist Extremity/Trunk Assessment RUE Assessment RUE Assessment: Within Functional Limits LUE Assessment LUE Assessment: Within Functional Limits  Care Tool Care Tool Self Care Eating   Eating Assist  Level: Set up assist    Oral Care    Oral Care Assist Level: Set up assist    Bathing   Body parts bathed by patient: Right arm;Chest;Left arm;Abdomen;Front perineal area;Left upper leg;Right upper leg;Face Body parts bathed by helper: Right lower leg;Left lower leg   Assist Level: Total Assistance - Patient < 25%    Upper Body Dressing(including orthotics)   What is the patient wearing?: Pull over shirt   Assist Level: Set up assist    Lower Body Dressing (excluding footwear)   What  is the patient wearing?: Underwear/pull up;Pants Assist for lower body dressing: Dependent - Patient 0%    Putting on/Taking off footwear   What is the patient wearing?: Non-skid slipper socks;Socks;Shoes Assist for footwear: Dependent - Patient 0%       Care Tool Toileting Toileting activity   Assist for toileting: Maximal Assistance - Patient 25 - 49%     Care Tool Bed Mobility Roll left and right activity Roll left and right activity did not occur: Safety/medical concerns      Sit to lying activity Sit to lying activity did not occur: Safety/medical concerns      Lying to sitting on side of bed activity Lying to sitting on side of bed activity did not occur: the ability to move from lying on the back to sitting on the side of the bed with no back support.: Safety/medical concerns       Care Tool Transfers Sit to stand transfer   Sit to stand assist level: Contact Guard/Touching assist    Chair/bed transfer   Chair/bed transfer assist level: Minimal Assistance - Patient > 75%     Toilet transfer   Assist Level: Minimal Assistance - Patient > 75%     Care Tool Cognition  Expression of Ideas and Wants Expression of Ideas and Wants: 4. Without difficulty (complex and basic) - expresses complex messages without difficulty and with speech that is clear and easy to understand  Understanding Verbal and Non-Verbal Content Understanding Verbal and Non-Verbal Content: 4. Understands  (complex and basic) - clear comprehension without cues or repetitions   Memory/Recall Ability Memory/Recall Ability : Current season;Staff names and faces;That he or she is in a hospital/hospital unit   Refer to Care Plan for Long Term Goals  SHORT TERM GOAL WEEK 1 OT Short Term Goal 1 (Week 1): Pt wll don LB garments with CGA using AE OT Short Term Goal 2 (Week 1): Pt will perform stall shower threshold with AD as needed and CGA OT Short Term Goal 3 (Week 1): Pt will complete grooming standing level with close S  Recommendations for other services: Therapeutic Recreation  Outing/community reintegration   Skilled Therapeutic Intervention ADL ADL Eating: Set up Where Assessed-Eating: Wheelchair Grooming: Setup Where Assessed-Grooming: Sitting at sink Upper Body Bathing: Setup Where Assessed-Upper Body Bathing: Sitting at sink Lower Body Bathing: Maximal assistance Where Assessed-Lower Body Bathing: Sitting at sink;Standing at sink Upper Body Dressing: Setup Where Assessed-Upper Body Dressing: Sitting at sink Lower Body Dressing: Dependent Where Assessed-Lower Body Dressing: Standing at sink;Sitting at sink Toileting: Maximal assistance Where Assessed-Toileting: Teacher, adult education: Curator Method: Proofreader: Engineer, technical sales: Unable to Engineer, technical sales: Insurance underwriter Method: Ambulating ADL Comments: D LB with need for LB AE trng, Set up UB self care sink side, RW with min A toilet and stall shower to TTB, pain L hip/LE/groin, unable to test shower stall threshold in apt Mobility  Bed Mobility Bed Mobility: Not assessed Transfers Sit to Stand: Contact Guard/Touching assist Stand to Sit: Contact Guard/Touching assist OT Treatment/Interventions:   Session 1:  Pt seen for full initial OT evaluation and training session this am. Pt in bed upon OT arrival. OT  introduced role of therapy and purpose of session. OT conducted comprehensive OT evaluation and treatment session with status outlined in eval. Son present for initial part of session and OT care coordination completed for initiating OT treatment. OT introduced AE and pain mngt into  AM self care routine and mobility trng. Ice applied to L groin area for pain mngt. Pt will benefit from CIR to maximize functional level and safety prior to discharge home with family support. Pt left at end of session up in recliner with chair alarm set, tray table and nurse call bell within reach.    Session 2:  Pt seen for 2nd OT session with wife bedside for initial care coordination and training. Pt up in recliner with request for toileting and feeling somewhat constipated. Pt able to perform sit to stand with CGA to RW. Amb with antalgic gait from recliner to and from bathroom to regular toilet with grab bars. Min-mod A for garment mngt. Sat to void and could not have BM. Reported need for suppository for recurrent hemorrhoid. OT reported to nursing. Pt stood for 2 min sink side for hand washing and then requested back to bed. Increased time and pain reported with L LE mobility especially into bed. OT assessed L heel position and recommending Prevalon boot be ordered and applied for bed level skin protection and PA messaged and agreed. Left pt bed level with all needs in reach, bed exit active and nursing present with wife.   Pain:   4/10 at rest with escalation with L LE mobility upwards to 8/10 with releif with repositioning and rest as well as nursing admin pain meds during visit.    Discharge Criteria: Patient will be discharged from OT if patient refuses treatment 3 consecutive times without medical reason, if treatment goals not met, if there is a change in medical status, if patient makes no progress towards goals or if patient is discharged from hospital.  The above assessment, treatment plan, treatment  alternatives and goals were discussed and mutually agreed upon: by patient and by family  Vicenta Dunning 08/17/2022, 6:54 PM

## 2022-08-18 DIAGNOSIS — M79605 Pain in left leg: Secondary | ICD-10-CM | POA: Diagnosis not present

## 2022-08-18 DIAGNOSIS — K5900 Constipation, unspecified: Secondary | ICD-10-CM

## 2022-08-18 DIAGNOSIS — E1122 Type 2 diabetes mellitus with diabetic chronic kidney disease: Secondary | ICD-10-CM

## 2022-08-18 DIAGNOSIS — I10 Essential (primary) hypertension: Secondary | ICD-10-CM

## 2022-08-18 DIAGNOSIS — S728X2A Other fracture of left femur, initial encounter for closed fracture: Secondary | ICD-10-CM | POA: Diagnosis not present

## 2022-08-18 DIAGNOSIS — I5032 Chronic diastolic (congestive) heart failure: Secondary | ICD-10-CM | POA: Diagnosis not present

## 2022-08-18 DIAGNOSIS — N1832 Chronic kidney disease, stage 3b: Secondary | ICD-10-CM

## 2022-08-18 LAB — GLUCOSE, CAPILLARY
Glucose-Capillary: 149 mg/dL — ABNORMAL HIGH (ref 70–99)
Glucose-Capillary: 158 mg/dL — ABNORMAL HIGH (ref 70–99)
Glucose-Capillary: 133 mg/dL — ABNORMAL HIGH (ref 70–99)
Glucose-Capillary: 163 mg/dL — ABNORMAL HIGH (ref 70–99)

## 2022-08-18 MED ORDER — CARVEDILOL 3.125 MG PO TABS
3.1250 mg | ORAL_TABLET | Freq: Two times a day (BID) | ORAL | Status: DC
  Administered 2022-08-18 – 2022-08-31 (×25): 3.125 mg via ORAL
  Filled 2022-08-18 (×25): qty 1

## 2022-08-18 MED ORDER — BISACODYL 10 MG RE SUPP
10.0000 mg | Freq: Every day | RECTAL | Status: DC | PRN
  Administered 2022-08-18: 10 mg via RECTAL
  Filled 2022-08-18: qty 1

## 2022-08-18 MED ORDER — SORBITOL 70 % SOLN
45.0000 mL | Freq: Once | Status: AC
  Administered 2022-08-18: 45 mL via ORAL
  Filled 2022-08-18 (×2): qty 60

## 2022-08-18 NOTE — Progress Notes (Signed)
Occupational Therapy Session Note  Patient Details  Name: Marco Acevedo MRN: [id number] Date of Birth: Apr 14, 1934  Today's Date: 08/18/2022 OT Individual Time: 1478-2956 OT Individual Time Calculation (min): 54 min    Short Term Goals: Week 1:  OT Short Term Goal 1 (Week 1): Pt wll don LB garments with CGA using AE OT Short Term Goal 2 (Week 1): Pt will perform stall shower threshold with AD as needed and CGA OT Short Term Goal 3 (Week 1): Pt will complete grooming standing level with close S  Skilled Therapeutic Interventions/Progress Updates:    OT intervention with focus on functional amb with RW, standing balance, toileting, safety awareness, and activity tolerance to increase independence with BADLs. Pt trantioned to ADL apt and requested to use toilet. Sit<>stand with supervision. Pt amb with RW into bathroom and stood at toilet to void. Toileting tasks with CGA. Pt returned to bed room and amb with RW to sink in kitchen to wash hands. Pt returned to w/c and transitioned to gym. Standing tasks tossing bean bags. Periods when pt removed both hands from RW with no LOB. Pt returned to room and remained in w/c with all needs within reach. Belt alarm activated.   Therapy Documentation Precautions:  Precautions Precautions: Fall Precaution Comments: WBAT, no precautions Restrictions Weight Bearing Restrictions: Yes LLE Weight Bearing: Weight bearing as tolerated Pain:  Pt c/o 5/10 pain in Lt hip; repositioned  Therapy/Group: Individual Therapy  Rich Brave 08/18/2022, 2:58 PM

## 2022-08-18 NOTE — Progress Notes (Signed)
PROGRESS NOTE   Subjective/Complaints: Reports he has not had BM in several days.  Patient continues to report that his medications are "messed up ".  He has a list of his home medications by his bedside.  He reports he was taking glipizide, Coreg and farxiga before breakfast every morning.  He had a condom cath overnight that was removed by nursing this morning.  ROS:  Pt denies SOB, abd pain, CP, N/V/D, and vision changes + constipation Except for HPI  Objective:   No results found. Recent Labs    08/16/22 0207 08/17/22 0657  WBC 6.2 7.0  HGB 9.0* 10.2*  HCT 27.0* 30.2*  PLT 195 236    Recent Labs    08/16/22 0207 08/17/22 0657  NA 134* 135  K 4.3 4.6  CL 103 103  CO2 24 24  GLUCOSE 161* 192*  BUN 53* 50*  CREATININE 2.30* 2.42*  CALCIUM 8.5* 8.9     Intake/Output Summary (Last 24 hours) at 08/18/2022 1103 Last data filed at 08/18/2022 0913 Gross per 24 hour  Intake 480 ml  Output 1550 ml  Net -1070 ml         Physical Exam: Vital Signs Blood pressure (!) 124/57, pulse 78, temperature 98.5 F (36.9 C), resp. rate 16, weight 88.3 kg, SpO2 (!) 88 %.      General: awake, alert, appropriate, talkative; sitting up in bed; NAD HENT: conjugate gaze; oropharynx moist CV: regular rate and rhythm; no JVD Pulmonary: CTA B/L; no W/R/R- good air movement GI: soft, NT, mildly distended, (+)BS- slightly hypoactive Psychiatric: appropriate Neurological: Ox3 Extremities: no LE edema seen Skin: Warm and dry, dressing on L lateral hip MS: LLE strength 2-/5 in L HF, KE, DF and PF at 4-4+/5- limited by pain  Assessment/Plan: 1. Functional deficits which require 3+ hours per day of interdisciplinary therapy in a comprehensive inpatient rehab setting. Physiatrist is providing close team supervision and 24 hour management of active medical problems listed below. Physiatrist and rehab team continue to assess  barriers to discharge/monitor patient progress toward functional and medical goals  Care Tool:  Bathing    Body parts bathed by patient: Right arm, Chest, Left arm, Abdomen, Front perineal area, Left upper leg, Right upper leg, Face   Body parts bathed by helper: Right lower leg, Left lower leg     Bathing assist Assist Level: Total Assistance - Patient < 25%     Upper Body Dressing/Undressing Upper body dressing   What is the patient wearing?: Pull over shirt    Upper body assist Assist Level: Set up assist    Lower Body Dressing/Undressing Lower body dressing      What is the patient wearing?: Underwear/pull up, Pants     Lower body assist Assist for lower body dressing: Dependent - Patient 0%     Toileting Toileting    Toileting assist Assist for toileting: Maximal Assistance - Patient 25 - 49%     Transfers Chair/bed transfer  Transfers assist     Chair/bed transfer assist level: Minimal Assistance - Patient > 75%     Locomotion Ambulation   Ambulation assist      Assist level:  Contact Guard/Touching assist Assistive device: Walker-rolling Max distance: 50   Walk 10 feet activity   Assist     Assist level: Contact Guard/Touching assist Assistive device: Walker-rolling   Walk 50 feet activity   Assist Walk 50 feet with 2 turns activity did not occur: Safety/medical concerns (pain, weakness)  Assist level: Contact Guard/Touching assist Assistive device: Walker-rolling    Walk 150 feet activity   Assist Walk 150 feet activity did not occur: Safety/medical concerns         Walk 10 feet on uneven surface  activity   Assist     Assist level: Contact Guard/Touching assist     Wheelchair     Assist Is the patient using a wheelchair?: Yes Type of Wheelchair: Manual    Wheelchair assist level: Supervision/Verbal cueing Max wheelchair distance: 150 ft    Wheelchair 50 feet with 2 turns activity    Assist         Assist Level: Supervision/Verbal cueing   Wheelchair 150 feet activity     Assist      Assist Level: Supervision/Verbal cueing   Blood pressure (!) 124/57, pulse 78, temperature 98.5 F (36.9 C), resp. rate 16, weight 88.3 kg, SpO2 (!) 88 %.  Medical Problem List and Plan: 1. Functional deficits secondary to left intertrochanteric femur fracture S/P IM nail and ORIF- is WBAT             -patient may shower             -ELOS/Goals: 7 days modI             Continue CIR, PT and OT WBAT 2.  Antithrombotics: -DVT/anticoagulation:  Pharmaceutical: Lovenox 30 mg X 4 weeks             -antiplatelet therapy: aspirin 81 mg daily   3. Pain Management: Tylenol as needed             -continue gabapentin 300 mg BID             -continue Lidoderm daily   5/9- switched gabapentin to 600 mg QHS since pt only took 300 mg QHS at home- also on Oxy 5-10 mg q4 hours prn- asked pt to take prior to AM therapy.   5/10-reports pain controlled overall, continue current regimen 4. Mood/Behavior/Sleep: LCSW to evaluate and provide emotional support             -antipsychotic agents: n/a   5. Neuropsych/cognition: This patient is capable of making decisions on his own behalf.   6. Skin/Wound Care: Routine skin care checks   5/9- Dressing in place- con't dressings 7. Fluids/Electrolytes/Nutrition: Routine Is and Os and follow-up chemistries             -carb modified diet   8: Hypertension: monitor TID and prn             -continue Norvasc 5  mg daily             -continue Coreg 3.125 mg BID             -continue Imdur 120 mg daily   5/9- BP labile- 110s-180s in last 24 hours- will monitor for trend  -5/10 BP controlled overall, continue current regimen however will schedule Coreg earlier in the morning per patient's request    08/18/2022    4:05 AM 08/17/2022    7:55 PM 08/17/2022    3:30 PM  Vitals with BMI  Weight 194 lbs 11 oz  BMI 29.61    Systolic 124 124 742  Diastolic 57 62 78   Pulse 78 72 78    9: Hyperlipidemia: continue statin     10: BPH/urinary frequency/retention: continue Proscar   11: History of gout: continue allopurinol   12: Left hip fracture s/p IM nail 5/03 Dr. Blanchie Dessert             -WBAT LLE             -change dressing as needed for soiling/if wet   13: CAD: s/p stenting X 2; stable. On statin, aspirin and BB. Restart Plavix   14: CKD stage IIIb>>IV: baseline Cr ~2.5 (home on Farxiga)             -follow-up BMP   5/9- Will restart Farxiga-   15: COPD: stable             -continue albuterol inhaler prn phlegm   16: GERD/HH: continue Protonix and Carafate   17: OSA: not using CPAP (he is not clear regarding results of sleep study)   18: DM-2: CBGs QID and SSI (home on glipizide and Farxiga)  A1c 6.5%   5/9- will not restart Glipizide Cr is 2.42- will restart Farxiga 10 mg daily.   -5/10 CBGs a little improved, continue to monitor with restart of farxiga 19: Chronic diastolic CHF: continue meds as above             -daily weight   5/9- Weight 88.2 kg today  5/10 wt stable,continue to follow Filed Weights   08/16/22 1900 08/18/22 0405  Weight: 88.2 kg 88.3 kg    20: ABLA, chronic anemia: follow-up CBC             -restart PO iron   5/9- Hb up to 10- con't to monitor 21: Pruritus/rash on back: will order Sarna              22: Tremors: continue primidone   23. Screening for vitamin D deficiency: check vitamin D level tomorrow   23. Hypotension: decrease amlodipine to 2.5mg  daily.  5/9- BP running 110s-180s- will see how trends go- since somewhat labile right now.   24. Constipation  -5/10 Sorbitol 45ml ordered      LOS: 2 days A FACE TO FACE EVALUATION WAS PERFORMED  Fanny Dance 08/18/2022, 11:03 AM

## 2022-08-18 NOTE — Progress Notes (Signed)
Patient ID: Marco Acevedo, male   DOB: October 21, 1934, 88 y.o.   MRN: [id number] Pt was asking for rollatorr to use at home upon discharge. Have received order via PA and will sent to [facility] to begin the process to obtain one for him. He also has a regular rolling walker if needed to use from wife.

## 2022-08-18 NOTE — Progress Notes (Signed)
Physical Therapy Session Note  Patient Details  Name: Marco Acevedo MRN: [id number] Date of Birth: [birth year]  Today's Date: 08/18/2022 PT Individual Time: 0800-0915 PT Individual Time Calculation (min): 75 min   Short Term Goals: Week 1:  PT Short Term Goal 1 (Week 1): Pt will require supervision for dynamic standing balance with LRAD PT Short Term Goal 2 (Week 1): Pt will require min A for stair negotiation PT Short Term Goal 3 (Week 1): Pt will require supervision for gait 50 ft with LRAD  Skilled Therapeutic Interventions/Progress Updates: Pt presents sitting EOB and agreeable to therapy.  Pt donned shorts w/ min A for threading over L foot only.  Nsg removed condom cath.  Pt performs sit to stand w/ CGA but verbal cues for hand placement and forward scoot.  Pt required steadying only for pulling shorts over hips.  Pt amb x 20' w/ RW and CGA w/ step-to or less pattern.  Pt cued for sequencing and use of UES to improve step-through pattern x 40% of distance.  Pt wheeled rest of distance to dayroom .  Pt performed LE there ex to increase strength and ROM.  Pt performed LAQ, hip flexion and abd/add 3 x 10-15, 2.5# to RLE and AAROM to LLE.  Pt cued for breathing technique, and reason for active initiation technique to LLE.  Pt amb x 20' and 50' w/ RW and min A w/ improved foot clearance and step-length to step-through, but does fatigue and demos increased cadence.  Pt returned to room and remained sitting in w/c w/ all needs in reach.  Pt refuses chair alarm "I will do whatever I am told to do."     Therapy Documentation Precautions:  Precautions Precautions: Fall Precaution Comments: WBAT, no precautions Restrictions Weight Bearing Restrictions: Yes LLE Weight Bearing: Weight bearing as tolerated General:   Vital Signs:  Pain:5/10 Pain Assessment Pain Scale: Faces Faces Pain Scale: Hurts even more Pain Type: Acute pain;Surgical pain Pain Location: Hip Pain Orientation: Left Pain  Descriptors / Indicators: Aching Pain Onset: On-going Pain Intervention(s): Medication (See eMAR)    Therapy/Group: Individual Therapy  Lucio Edward 08/18/2022, 10:30 AM

## 2022-08-18 NOTE — Progress Notes (Signed)
Slept quite well after pain  and sleep medication given. Assisted with bathroom privileges. Required Maalox for indigestion. Voiding well. Remains alert and oriented. VS stable. Safety maintained.

## 2022-08-18 NOTE — Progress Notes (Signed)
Occupational Therapy Session Note  Patient Details  Name: Marco Acevedo MRN: [id number] Date of Birth: 1934-05-06  Today's Date: 08/18/2022 OT Individual Time: 0945-1100 OT Individual Time Calculation (min): 75 min    Short Term Goals: Week 1:  OT Short Term Goal 1 (Week 1): Pt wll don LB garments with CGA using AE OT Short Term Goal 2 (Week 1): Pt will perform stall shower threshold with AD as needed and CGA OT Short Term Goal 3 (Week 1): Pt will complete grooming standing level with close S  Skilled Therapeutic Interventions/Progress Updates:  Session 1:  Pt seen for skilled OT session with plan for 1st full shower retraining. Pt still reports constipation and OT offered to use toilet prior to stall shower access. Pt agreeable to try. OT covered incision on L hip/LE and all foam dressings with waterproofing. Pt performed sit to stand from w/c with CGA due to initial stiffness from sitting after PT. Pt amb with CGA from w/c to toilet with RW.  BM attempted with no results nor voiding. Pt able to doff LB clothing with mod a for LE and S for UB. Transferred to TTB with grab bar with min A and cues for technique. Antalgia throughout mobility in L hip. Use of  LH sponge and mod A for LB bathing and min A UB bathing shower level due to set up. Amb back to w/c set sink side with CGA. Reacher for donning undergarment and shorts with mod A. OT donned slipper socks this visit due to time constraints. UB dressing with set up only. Oral and hair care including blow drying hair seated with set up. Hand off to NT to complete due to time constraints.    Pain:  2/10 at rest, pain meds given earlier, 4/10 with L LE motion for transfers and dressing with relief with rest, warm shower and distraction      Therapy Documentation Precautions:  Precautions Precautions: Fall Precaution Comments: WBAT, no precautions Restrictions Weight Bearing Restrictions: Yes LLE Weight Bearing: Weight bearing as  tolerated    Therapy/Group: Individual Therapy  Vicenta Dunning 08/18/2022, 7:30 AM

## 2022-08-19 DIAGNOSIS — S728X2A Other fracture of left femur, initial encounter for closed fracture: Secondary | ICD-10-CM | POA: Diagnosis not present

## 2022-08-19 LAB — GLUCOSE, CAPILLARY
Glucose-Capillary: 158 mg/dL — ABNORMAL HIGH (ref 70–99)
Glucose-Capillary: 136 mg/dL — ABNORMAL HIGH (ref 70–99)
Glucose-Capillary: 247 mg/dL — ABNORMAL HIGH (ref 70–99)
Glucose-Capillary: 133 mg/dL — ABNORMAL HIGH (ref 70–99)

## 2022-08-19 MED ORDER — VITAMIN D (ERGOCALCIFEROL) 1.25 MG (50000 UNIT) PO CAPS
50000.0000 [IU] | ORAL_CAPSULE | ORAL | Status: DC
  Administered 2022-08-19 – 2022-08-26 (×2): 50000 [IU] via ORAL
  Filled 2022-08-19 (×2): qty 1

## 2022-08-19 MED ORDER — NITROGLYCERIN 0.4 MG SL SUBL
0.4000 mg | SUBLINGUAL_TABLET | SUBLINGUAL | Status: DC | PRN
  Administered 2022-08-19 – 2022-08-20 (×3): 0.4 mg via SUBLINGUAL
  Filled 2022-08-19 (×4): qty 1

## 2022-08-19 NOTE — Progress Notes (Signed)
PROGRESS NOTE   Subjective/Complaints: Had constipation that turned into loose stools after laxatives Asks for nitroglycerin for indigestion, discussed that his cardiologist prescribed this for him outpatient  ROS:  Pt denies SOB, abd pain, CP, N/V/D, and vision changes + loose stools Except for HPI  Objective:   No results found. Recent Labs    08/17/22 0657  WBC 7.0  HGB 10.2*  HCT 30.2*  PLT 236   Recent Labs    08/17/22 0657  NA 135  K 4.6  CL 103  CO2 24  GLUCOSE 192*  BUN 50*  CREATININE 2.42*  CALCIUM 8.9    Intake/Output Summary (Last 24 hours) at 08/19/2022 1903 Last data filed at 08/19/2022 1315 Gross per 24 hour  Intake 720 ml  Output 950 ml  Net -230 ml        Physical Exam: Vital Signs Blood pressure (!) 109/50, pulse 74, temperature 99.7 F (37.6 C), temperature source Oral, resp. rate 18, weight 88.1 kg, SpO2 91 %.      General: awake, alert, appropriate, talkative; sitting up in bed; NAD HENT: conjugate gaze; oropharynx moist CV: regular rate and rhythm; no JVD Pulmonary: CTA B/L; no W/R/R- good air movement GI: soft, NT, mildly distended, (+)BS- slightly hypoactive Psychiatric: appropriate, very pleasant and friendly Neurological: Ox3 Extremities: no LE edema seen Skin: Warm and dry, dressing on L lateral hip MS: LLE strength 2-/5 in L HF, KE, DF and PF at 4-4+/5- limited by pain  Assessment/Plan: 1. Functional deficits which require 3+ hours per day of interdisciplinary therapy in a comprehensive inpatient rehab setting. Physiatrist is providing close team supervision and 24 hour management of active medical problems listed below. Physiatrist and rehab team continue to assess barriers to discharge/monitor patient progress toward functional and medical goals  Care Tool:  Bathing    Body parts bathed by patient: Right arm, Chest, Left arm, Abdomen, Front perineal area,  Left upper leg, Right upper leg, Face   Body parts bathed by helper: Right lower leg, Left lower leg     Bathing assist Assist Level: Total Assistance - Patient < 25%     Upper Body Dressing/Undressing Upper body dressing   What is the patient wearing?: Pull over shirt    Upper body assist Assist Level: Set up assist    Lower Body Dressing/Undressing Lower body dressing      What is the patient wearing?: Underwear/pull up, Pants     Lower body assist Assist for lower body dressing: Dependent - Patient 0%     Toileting Toileting    Toileting assist Assist for toileting: Contact Guard/Touching assist     Transfers Chair/bed transfer  Transfers assist     Chair/bed transfer assist level: Minimal Assistance - Patient > 75%     Locomotion Ambulation   Ambulation assist      Assist level: Contact Guard/Touching assist Assistive device: Walker-rolling Max distance: 50   Walk 10 feet activity   Assist     Assist level: Contact Guard/Touching assist Assistive device: Walker-rolling   Walk 50 feet activity   Assist Walk 50 feet with 2 turns activity did not occur: Safety/medical concerns (pain, weakness)  Assist level: Contact Guard/Touching assist Assistive device: Walker-rolling    Walk 150 feet activity   Assist Walk 150 feet activity did not occur: Safety/medical concerns         Walk 10 feet on uneven surface  activity   Assist     Assist level: Contact Guard/Touching assist     Wheelchair     Assist Is the patient using a wheelchair?: Yes Type of Wheelchair: Manual    Wheelchair assist level: Supervision/Verbal cueing Max wheelchair distance: 150 ft    Wheelchair 50 feet with 2 turns activity    Assist        Assist Level: Supervision/Verbal cueing   Wheelchair 150 feet activity     Assist      Assist Level: Supervision/Verbal cueing   Blood pressure (!) 109/50, pulse 74, temperature 99.7 F (37.6  C), temperature source Oral, resp. rate 18, weight 88.1 kg, SpO2 91 %.  Medical Problem List and Plan: 1. Functional deficits secondary to left intertrochanteric femur fracture S/P IM nail and ORIF- is WBAT             -patient may shower             -ELOS/Goals: 7 days modI             Continue CIR, PT and OT WBAT 2.  Antithrombotics: -DVT/anticoagulation:  Pharmaceutical: Lovenox 30 mg X 4 weeks             -antiplatelet therapy: aspirin 81 mg daily   3. Pain Management: Tylenol as needed             -continue gabapentin 300 mg BID             -continue Lidoderm daily   5/9- switched gabapentin to 600 mg QHS since pt only took 300 mg QHS at home- also on Oxy 5-10 mg q4 hours prn- asked pt to take prior to AM therapy.   5/10-reports pain controlled overall, continue current regimen 4. Mood/Behavior/Sleep: LCSW to evaluate and provide emotional support             -antipsychotic agents: n/a   5. Neuropsych/cognition: This patient is capable of making decisions on his own behalf.   6. Skin/Wound Care: Routine skin care checks   5/9- Dressing in place- con't dressings 7. Fluids/Electrolytes/Nutrition: Routine Is and Os and follow-up chemistries             -carb modified diet   8: Hypertension: monitor TID and prn             -continue Norvasc 5  mg daily             -continue Coreg 3.125 mg BID             -continue Imdur 120 mg daily   5/9- BP labile- 110s-180s in last 24 hours- will monitor for trend  -5/10 BP controlled overall, continue current regimen however will schedule Coreg earlier in the morning per patient's request    08/19/2022    1:15 PM 08/19/2022    6:40 AM 08/19/2022    5:42 AM  Vitals with BMI  Weight   194 lbs 4 oz  BMI   29.54  Systolic 109 138   Diastolic 50 60   Pulse 74 80     9: Hyperlipidemia: continue statin     10: BPH/urinary frequency/retention: continue Proscar   11: History of gout: continue allopurinol   12: Left hip fracture  s/p IM  nail 5/03 Dr. Blanchie Dessert             -WBAT LLE             -change dressing as needed for soiling/if wet   13: CAD: s/p stenting X 2; stable. On statin, aspirin and BB. Restart Plavix   14: CKD stage IIIb>>IV: baseline Cr ~2.5 (home on Farxiga)             -follow-up BMP   5/9- Will restart Farxiga-   15: COPD: stable             -continue albuterol inhaler prn phlegm   16: GERD/HH: continue Protonix and Carafate   17: OSA: not using CPAP (he is not clear regarding results of sleep study)   18: DM-2: CBGs QID and SSI (home on glipizide and Farxiga)  A1c 6.5%   5/9- will not restart Glipizide Cr is 2.42- will restart Farxiga 10 mg daily.   -5/10 CBGs a little improved, continue to monitor with restart of farxiga 19: Chronic diastolic CHF: continue meds as above             -daily weight   5/9- Weight 88.2 kg today  5/10 wt stable,continue to follow  5/11: weight reviewed and has decreased Filed Weights   08/16/22 1900 08/18/22 0405 08/19/22 0542  Weight: 88.2 kg 88.3 kg 88.1 kg    20: ABLA, chronic anemia: follow-up CBC             -restart PO iron   5/9- Hb up to 10- con't to monitor 21: Pruritus/rash on back: will order Sarna              22: Tremors: continue primidone   23. Suboptimal vitamin D: start ergocalciferol 50,000U once per week   23. Hypotension: currently hypotensive, will d/c amlodipine  24. Constipation  -5/10 Sorbitol 45ml ordered  5/11: discussed that stools are now loose  25. Indigestion: may overlap with chronic angina, discussed that his outpatient cardiologist prescribed nitroglycerin and this gives him relief, ordered here.       LOS: 3 days A FACE TO FACE EVALUATION WAS PERFORMED  Drema Pry Itzayanna Kaster 08/19/2022, 7:03 PM

## 2022-08-19 NOTE — Progress Notes (Signed)
At 0542, PRN oxy ir 5mg 's given along with PRN tums. Restless night, trazodone not effective. Condom cath came off 2 times requiring bed change. Marco Acevedo

## 2022-08-19 NOTE — Progress Notes (Signed)
C/O constipation on previous shift. PRN sorbitol given at 1704 and PRN dulcolax supp. Given at 1803. At 1930, assisted to BR, passed hard stool. At 2100, assisted to BR, patient attempted to disimpact himself, blood observed on toilet paper. Rectal check with large hard stool, disimpacted for large amount. 2 more large continent stools after RN disimpacted. At 2130, PRN maalox given for complaint of indigestion. Condom cath applied. At 0215, PRN maalox given & trazdone. Marco Acevedo

## 2022-08-19 NOTE — IPOC Note (Signed)
Overall Plan of Care [facility]) Patient Details Name: Marco Acevedo MRN: [id number] DOB: 1934/07/21  Admitting Diagnosis: Other fracture of left femur, initial encounter for closed fracture [facility])  Hospital Problems: Principal Problem:   Other fracture of left femur, initial encounter for closed fracture [facility])     Functional Problem List: Nursing Bowel, Endurance, Medication Management, Motor, Pain, Safety, Skin Integrity  PT Balance, Pain, Sensory, Motor, Endurance  OT Balance, Motor, Sensory, Skin Integrity, Pain, Edema, Endurance  SLP    TR         Basic ADL's: OT Grooming, Bathing, Dressing, Toileting     Advanced  ADL's: OT Simple Meal Preparation     Transfers: PT Bed Mobility, Bed to Chair, Car  OT Toilet, Tub/Shower     Locomotion: PT Ambulation     Additional Impairments: OT    SLP        TR      Anticipated Outcomes Item Anticipated Outcome  Self Feeding indep  Swallowing      Basic self-care  mod indep  Toileting  mod indep   Bathroom Transfers mod indep  Bowel/Bladder  continent B/B  Transfers  Mod I  Locomotion  Mod I  Communication     Cognition     Pain  less than 4  Safety/Judgment  remain fall free   Therapy Plan: PT Intensity: Minimum of 1-2 x/day ,45 to 90 minutes PT Frequency: 5 out of 7 days PT Duration Estimated Length of Stay: 10-12 days OT Intensity: Minimum of 1-2 x/day, 45 to 90 minutes OT Frequency: 5 out of 7 days OT Duration/Estimated Length of Stay: 10-12 d     Team Interventions: Nursing Interventions Patient/Family Education, Pain Management, Medication Management, Discharge Planning, Bowel Management, Skin Care/Wound Management, Disease Management/Prevention  PT interventions Ambulation/gait training, Discharge planning, DME/adaptive equipment instruction, Functional mobility training, Pain management, Psychosocial support, Therapeutic Activities, UE/LE Strength taining/ROM, UE/LE Coordination activities,  Therapeutic Exercise, Stair training, Patient/family education, Community reintegration, Warden/ranger  OT Interventions Warden/ranger, Community reintegration, Disease mangement/prevention, Development worker, international aid stimulation, Neuromuscular re-education, Equities trader education, Self Care/advanced ADL retraining, Splinting/orthotics, Therapeutic Exercise, UE/LE Coordination activities, Wheelchair propulsion/positioning, Discharge planning, DME/adaptive equipment instruction, Functional mobility training, Pain management, Psychosocial support, Skin care/wound managment, Therapeutic Activities, UE/LE Strength taining/ROM  SLP Interventions    TR Interventions    SW/CM Interventions Discharge Planning, Psychosocial Support, Patient/Family Education   Barriers to Discharge MD  Medical stability  Nursing Decreased caregiver support, Home environment access/layout, Wound Care, Weight bearing restrictions 1 level with 1 ste no rail  PT Decreased caregiver support, Home environment access/layout condo, 1 level with threshold to enter  OT Inaccessible home environment, Decreased caregiver support, Wound Care new L hip wound, elderly wife, step to enter home  SLP      SW       Team Discharge Planning: Destination: PT-Home ,OT- Home , SLP-  Projected Follow-up: PT-Home health PT, OT-  Home health OT, Outpatient OT, SLP-  Projected Equipment Needs: PT-Rolling walker with 5" wheels, OT- Rolling walker with 5" wheels, Tub/shower seat, SLP-  Equipment Details: PT-would benefit from RW, OT-home vs outpt depending on transportation Patient/family involved in discharge planning: PT- Patient, Family Adult nurse,  OT-Patient, Family member/caregiver, SLP-   MD ELOS: 7 days Medical Rehab Prognosis:  Excellent Assessment: The patient has been admitted for CIR therapies with the diagnosis of left intertrochanteric femur fracture s/p ORIF. The team will be addressing functional  mobility, strength, stamina, balance, safety, adaptive techniques and equipment,  self-care, bowel and bladder mgt, patient and caregiver education. Goals have been set at modI. Anticipated discharge destination is home.         See Team Conference Notes for weekly updates to the plan of care

## 2022-08-20 ENCOUNTER — Inpatient Hospital Stay (HOSPITAL_COMMUNITY): Payer: No Typology Code available for payment source

## 2022-08-20 DIAGNOSIS — S728X2A Other fracture of left femur, initial encounter for closed fracture: Secondary | ICD-10-CM | POA: Diagnosis not present

## 2022-08-20 LAB — GLUCOSE, CAPILLARY
Glucose-Capillary: 150 mg/dL — ABNORMAL HIGH (ref 70–99)
Glucose-Capillary: 229 mg/dL — ABNORMAL HIGH (ref 70–99)
Glucose-Capillary: 152 mg/dL — ABNORMAL HIGH (ref 70–99)
Glucose-Capillary: 179 mg/dL — ABNORMAL HIGH (ref 70–99)

## 2022-08-20 MED ORDER — ALBUTEROL SULFATE HFA 108 (90 BASE) MCG/ACT IN AERS
2.0000 | INHALATION_SPRAY | RESPIRATORY_TRACT | Status: DC | PRN
  Administered 2022-08-22 – 2022-08-26 (×3): 2 via RESPIRATORY_TRACT

## 2022-08-20 NOTE — Progress Notes (Signed)
PROGRESS NOTE   Subjective/Complaints: No new complaints this morning Albuterol ordered for shortness of breath/upper airway congestion. Slept much better last night  ROS: +upper airway congestion- improved with albuterol Denies, abd pain, CP, N/V/D, and vision changes + loose stools Except for HPI  Objective:   No results found. No results for input(s): "WBC", "HGB", "HCT", "PLT" in the last 72 hours.  No results for input(s): "NA", "K", "CL", "CO2", "GLUCOSE", "BUN", "CREATININE", "CALCIUM" in the last 72 hours.   Intake/Output Summary (Last 24 hours) at 08/20/2022 0949 Last data filed at 08/20/2022 4098 Gross per 24 hour  Intake 720 ml  Output 1200 ml  Net -480 ml        Physical Exam: Vital Signs Blood pressure (!) 119/51, pulse 66, temperature 98.3 F (36.8 C), resp. rate 18, weight 88.4 kg, SpO2 (!) 82 %. Gen: no distress, normal appearing HEENT: oral mucosa pink and moist, NCAT Cardio: Reg rate Chest: normal effort, normal rate of breathing GI: soft, NT, mildly distended, (+)BS- slightly hypoactive Psychiatric: appropriate, very pleasant and friendly Neurological: Ox3 Extremities: no LE edema seen Skin: Warm and dry, dressing on L lateral hip MS: LLE strength 2-/5 in L HF, KE, DF and PF at 4-4+/5- limited by pain  Assessment/Plan: 1. Functional deficits which require 3+ hours per day of interdisciplinary therapy in a comprehensive inpatient rehab setting. Physiatrist is providing close team supervision and 24 hour management of active medical problems listed below. Physiatrist and rehab team continue to assess barriers to discharge/monitor patient progress toward functional and medical goals  Care Tool:  Bathing    Body parts bathed by patient: Right arm, Chest, Left arm, Abdomen, Front perineal area, Left upper leg, Right upper leg, Face   Body parts bathed by helper: Right lower leg, Left lower  leg     Bathing assist Assist Level: Total Assistance - Patient < 25%     Upper Body Dressing/Undressing Upper body dressing   What is the patient wearing?: Pull over shirt    Upper body assist Assist Level: Set up assist    Lower Body Dressing/Undressing Lower body dressing      What is the patient wearing?: Underwear/pull up, Pants     Lower body assist Assist for lower body dressing: Dependent - Patient 0%     Toileting Toileting    Toileting assist Assist for toileting: Contact Guard/Touching assist     Transfers Chair/bed transfer  Transfers assist     Chair/bed transfer assist level: Minimal Assistance - Patient > 75%     Locomotion Ambulation   Ambulation assist      Assist level: Contact Guard/Touching assist Assistive device: Walker-rolling Max distance: 50   Walk 10 feet activity   Assist     Assist level: Contact Guard/Touching assist Assistive device: Walker-rolling   Walk 50 feet activity   Assist Walk 50 feet with 2 turns activity did not occur: Safety/medical concerns (pain, weakness)  Assist level: Contact Guard/Touching assist Assistive device: Walker-rolling    Walk 150 feet activity   Assist Walk 150 feet activity did not occur: Safety/medical concerns         Walk 10 feet on  uneven surface  activity   Assist     Assist level: Contact Guard/Touching assist     Wheelchair     Assist Is the patient using a wheelchair?: Yes Type of Wheelchair: Manual    Wheelchair assist level: Supervision/Verbal cueing Max wheelchair distance: 150 ft    Wheelchair 50 feet with 2 turns activity    Assist        Assist Level: Supervision/Verbal cueing   Wheelchair 150 feet activity     Assist      Assist Level: Supervision/Verbal cueing   Blood pressure (!) 119/51, pulse 66, temperature 98.3 F (36.8 C), resp. rate 18, weight 88.4 kg, SpO2 (!) 82 %.  Medical Problem List and Plan: 1. Functional  deficits secondary to left intertrochanteric femur fracture S/P IM nail and ORIF- is WBAT             -patient may shower             -ELOS/Goals: 7 days modI             Continue CIR, PT and OT WBAT 2.  Antithrombotics: -DVT/anticoagulation:  Pharmaceutical: Lovenox 30 mg X 4 weeks             -antiplatelet therapy: aspirin 81 mg daily   3. Pain Management: Tylenol as needed             -continue gabapentin 300 mg BID             -continue Lidoderm daily   5/9- switched gabapentin to 600 mg QHS since pt only took 300 mg QHS at home- also on Oxy 5-10 mg q4 hours prn- asked pt to take prior to AM therapy.   5/10-reports pain controlled overall, continue current regimen 4. Mood/Behavior/Sleep: LCSW to evaluate and provide emotional support             -antipsychotic agents: n/a   5. Neuropsych/cognition: This patient is capable of making decisions on his own behalf.   6. Skin/Wound Care: Routine skin care checks   5/9- Dressing in place- con't dressings 7. Fluids/Electrolytes/Nutrition: Routine Is and Os and follow-up chemistries             -carb modified diet   8: Hypertension: monitor TID and prn             -continue Norvasc 5  mg daily             -continue Coreg 3.125 mg BID             -continue Imdur 120 mg daily   5/9- BP labile- 110s-180s in last 24 hours- will monitor for trend  -5/10 BP controlled overall, continue current regimen however will schedule Coreg earlier in the morning per patient's request    08/20/2022    6:39 AM 08/20/2022    5:52 AM 08/20/2022   12:29 AM  Vitals with BMI  Weight 194 lbs 14 oz    BMI 29.64    Systolic  119 136  Diastolic  51 56  Pulse  66 72    9: Hyperlipidemia: continue statin     10: BPH/urinary frequency/retention: continue Proscar   11: History of gout: continue allopurinol   12: Left hip fracture s/p IM nail 5/03 Dr. Blanchie Dessert             -WBAT LLE             -change dressing as needed for soiling/if wet   13:  CAD:  s/p stenting X 2; stable. On statin, aspirin and BB. Restart Plavix   14: CKD stage IIIb>>IV: baseline Cr ~2.5 (home on Farxiga)             -follow-up BMP   5/9- Will restart Farxiga-   15: COPD: stable             -continue albuterol inhaler prn phlegm   16: GERD/HH: continue Protonix and Carafate   17: OSA: not using CPAP (he is not clear regarding results of sleep study)   18: DM-2: CBGs QID and SSI (home on glipizide and Farxiga)  A1c 6.5%   5/9- will not restart Glipizide Cr is 2.42- will restart Farxiga 10 mg daily.   -5/10 CBGs a little improved, continue to monitor with restart of farxiga 19: Chronic diastolic CHF: continue meds as above             -daily weight   5/9- Weight 88.2 kg today  5/10 wt stable,continue to follow  5/11: weight reviewed and has decreased Filed Weights   08/18/22 0405 08/19/22 0542 08/20/22 0639  Weight: 88.3 kg 88.1 kg 88.4 kg    20: ABLA, chronic anemia: follow-up CBC             -restart PO iron   5/9- Hb up to 10- con't to monitor 21: Pruritus/rash on back: will order Sarna              22: Tremors: continue primidone   23. Suboptimal vitamin D: start ergocalciferol 50,000U once per week   23. Hypotension: currently hypotensive, will d/c amlodipine  24. Loose stools: discussed that these have improved  25. Indigestion: may overlap with chronic angina, discussed that his outpatient cardiologist prescribed nitroglycerin and this gives him relief, ordered here.   26. Shortness of breath: Albuterol ordered  27. Upper airway congestion: incentive spirometer ordered  28. Desaturation to 82 percent: CXR ordered    LOS: 4 days A FACE TO FACE EVALUATION WAS PERFORMED  Drema Pry Rosamary Boudreau 08/20/2022, 9:49 AM

## 2022-08-20 NOTE — Progress Notes (Signed)
Slept much better tonight.  At 0025, PRN nitroglycerin tab given per patient's request for complaint of indigestion. Oxy IR 10mg 's also given at [facility] for complaint of left hip pain. At 0134, PRN maalox and PRN trazodone given. Slept until 0600. Congested productive cough this morning. Encouraged I.S. "I got a lot of mucous stuck in there." Will give prn neb treatment.  1 urinary incontinence. No stool this shift. Alfredo Martinez A

## 2022-08-20 NOTE — Progress Notes (Signed)
Physical Therapy Session Note  Patient Details  Name: Marco Acevedo MRN: [id number] Date of Birth: 18-Mar-1935  Today's Date: 08/20/2022 PT Individual Time: 0800-0900 PT Individual Time Calculation (min): 60 min   Short Term Goals: Week 1:  PT Short Term Goal 1 (Week 1): Pt will require supervision for dynamic standing balance with LRAD PT Short Term Goal 2 (Week 1): Pt will require min A for stair negotiation PT Short Term Goal 3 (Week 1): Pt will require supervision for gait 50 ft with LRAD  Skilled Therapeutic Interventions/Progress Updates:      Therapy Documentation Precautions:  Precautions Precautions: Fall Precaution Comments: WBAT, no precautions Restrictions Weight Bearing Restrictions: Yes LLE Weight Bearing: Weight bearing as tolerated   Pt received semi-reclined in bed, requesting repositioning due to left hip pain. Pt reports 10-12 pain with bed mobility, nursing notified and administered medication for relief.   Pt requires min A for hooking R LE under L LE and pt able to mobilize to LE's off bed. Pt requires min A for rolling and transition from S/L to sitting.  PT provided total A to thread LE's through shorts. Pt CGA with STS with RW and pulling up shorts. Pt CGA with ambulatory transfer with RW to w/c. Pt transported total A to main therapy gym for time management. Pt requests to toilet and transported to apartment bathroom.   Pt CGA with STS with RW and ambulated to toilet (voided) and ambulated total distance of ~115 ft with RW CGA. Pt with decreased left stance time and weight shift and requires min verbal cues to correct. Pt with mild left knee instability with gait, plan to initiate quad training to improve strength and gait.   Pt returned to room and left seated in recliner at bedside with chair alarm on and all needs within reach.    Therapy/Group: Individual Therapy  Truitt Leep Truitt Leep PT, DPT  08/20/2022, 7:27 AM

## 2022-08-20 NOTE — Progress Notes (Signed)
Physical Therapy Session Note  Patient Details  Name: Marco Acevedo MRN: [id number] Date of Birth: 1934-09-03  Today's Date: 08/20/2022 PT Individual Time: 1300-1400, 1445-1533 PT Individual Time Calculation (min): 60 min, 48 min   Short Term Goals: Week 1:  PT Short Term Goal 1 (Week 1): Pt will require supervision for dynamic standing balance with LRAD PT Short Term Goal 2 (Week 1): Pt will require min A for stair negotiation PT Short Term Goal 3 (Week 1): Pt will require supervision for gait 50 ft with LRAD  Skilled Therapeutic Interventions/Progress Updates:      Treatment Session 1   Pt seated in recliner upon arrival. Pt agreeable to therapy. Pt reports 6/10 pain progressing to 8/10 pain in L LE and B shoulders, pt requesting medicine at end of session, therapist notified nurse. Pt provided seated rest breaks and resposiioning as needed for pain.   Pt performed stand pivot transfer with RW and CGA.   Pt transported dependent in [facility] for time/energy conservation. Pt ambulated 98 feet with RW and CGA, pt able to recalls previous cues for stepping R foot in front of L, breathing, and striking with heel/toe. Therapist continued same cues. Pt required seated rest break due to fatigue.    Pt ascended/descended 8 3 inch steps with B HR and step to gait and min-mod A. Therapist suggested ascending leading with R LE and descending leading with L LE but pt reports previous buckle with leading with L and opted to lead with R for both ascending/descending. Pt required seated rest break due to fatigue.    Pt performed 1x10 active assisted hip abduction, and hip flexion within pt pain tolerance  Treatment Session 2   Pt seated in recliner at start of session. Pt agreeable to therapy. Pt reports 9/10 pain at start of session, premedicated. Pt reports 6/10 pain after kinetron.   Pt ambulated 10 feet with RW and CGA from recliner to bed with decreased step length on R LE and decrease stance time  on L LE secondary to pain.   Pt performed sit to supine with min A to lift L LE onto bed and supine to sit with CGA, verbal/tactile cues provided for technique for rolling to R side and  utilizing gait belt to assist with lowering L LE.   pt attemtped assisting lifting L LE with gait belt, but unable secondary to pain.  Pt attempted lifting leg onto bed with using backward scooting versus hip flexion/abduction combination, but unable. Transitioned to other activity as pt reports bed mobility is significantly increasing pain level.   Pt performed stand pivot transfer bed to [facility] with RW and supervision with verbal cues for use of UE for controlled descent for stand<>sit.   Pt performed stand pivot transfer from [facility] to kinetron with RW and CGA with therapist setting up kinetron.   Pt performed the following on the kinetron to improve hip flexion strength to increase independence with bed mobility and car transfers; Kinetron 1x6 min 80 CM/sec, 1x6 min 60 CM/sec with therapist providing verbal cues for completion through full range.  Pt performed stand pivot transfer with RW and supervision. Pt transported dependent in [facility] to room.   Pt performed stand pivot transfer WC to recliner with RW and supervision.   Pt seated in recliner with all needs within reach and seatbelt alarm on. Therapist offered to elevate legs but pt refused.         Therapy Documentation Precautions:  Precautions Precautions: Fall  Precaution Comments: WBAT, no precautions Restrictions Weight Bearing Restrictions: Yes LLE Weight Bearing: Weight bearing as tolerated     Therapy/Group: Individual Therapy  [facility], , DPT  08/20/2022, 7:59 AM

## 2022-08-20 NOTE — Progress Notes (Signed)
Physical Therapy Session Note  Patient Details  Name: Marco Acevedo MRN: [id number] Date of Birth: 03-28-1935  Today's Date: 08/20/2022 PT Individual Time: 1018-1100 PT Individual Time Calculation (min): 42 min   Short Term Goals: Week 1:  PT Short Term Goal 1 (Week 1): Pt will require supervision for dynamic standing balance with LRAD PT Short Term Goal 2 (Week 1): Pt will require min A for stair negotiation PT Short Term Goal 3 (Week 1): Pt will require supervision for gait 50 ft with LRAD  Skilled Therapeutic Interventions/Progress Updates:     Pt received seated in recliner and agrees to therapy. Reports pain in L hip. Number not provided. PT provides rest breaks as needed as well as gentle mobility to manage pain. Pt performs sit to stand with CGA and cues for positioning. Pt ambulates x15' to [facility] with RW and CGA, with cues for posture, positioning, and hand placement for safe transition to WC. WC transport to gym. Pt performs stand step to Nustep with same cues and assistance. Pt completes Nustep for endurance training as well as providing self-control AAROM of L lower extremity for increased comfort and mobility. Pt completes x5:00 at workload of 4 with average steps per minute ~60. Seated rest break. Following, pt completes additional x10:00 at same speed. PT provides cues for hand and foot placement and completing full available ROM. Following, pt ambulates x100' with RW and CGA, with cues to decrease WB through RW for energy conservation, and increasing stride length and utilizing reciprocal gait pattern to increase stance time through L lower extremity. Pt performs stand step transfer back to recliner with CGA. Left seated in recliner with alarm intact and all needs within reach  Therapy Documentation Precautions:  Precautions Precautions: Fall Precaution Comments: WBAT, no precautions Restrictions Weight Bearing Restrictions: Yes LLE Weight Bearing: Weight bearing as  tolerated   Therapy/Group: Individual Therapy  Beau Fanny, PT, DPT 08/20/2022, 3:11 PM

## 2022-08-21 DIAGNOSIS — S728X2A Other fracture of left femur, initial encounter for closed fracture: Secondary | ICD-10-CM | POA: Diagnosis not present

## 2022-08-21 LAB — BASIC METABOLIC PANEL
Anion gap: 11 (ref 5–15)
BUN: 57 mg/dL — ABNORMAL HIGH (ref 8–23)
CO2: 23 mmol/L (ref 22–32)
Calcium: 8.8 mg/dL — ABNORMAL LOW (ref 8.9–10.3)
Chloride: 96 mmol/L — ABNORMAL LOW (ref 98–111)
Creatinine, Ser: 2.71 mg/dL — ABNORMAL HIGH (ref 0.61–1.24)
GFR, Estimated: 22 mL/min — ABNORMAL LOW (ref >60)
Glucose, Bld: 185 mg/dL — ABNORMAL HIGH (ref 70–99)
Potassium: 4.3 mmol/L (ref 3.5–5.1)
Sodium: 130 mmol/L — ABNORMAL LOW (ref 135–145)

## 2022-08-21 LAB — URINALYSIS, ROUTINE W REFLEX MICROSCOPIC
Bacteria, UA: NONE SEEN
Bilirubin Urine: NEGATIVE
Glucose, UA: >=500 mg/dL — AB
Hgb urine dipstick: NEGATIVE
Ketones, ur: NEGATIVE mg/dL
Leukocytes,Ua: NEGATIVE
Nitrite: NEGATIVE
Protein, ur: 30 mg/dL — AB
Specific Gravity, Urine: 1.009 (ref 1.005–1.030)
pH: 5 (ref 5.0–8.0)

## 2022-08-21 LAB — CBC
HCT: 30.5 % — ABNORMAL LOW (ref 39.0–52.0)
Hemoglobin: 9.7 g/dL — ABNORMAL LOW (ref 13.0–17.0)
MCH: 29.2 pg (ref 26.0–34.0)
MCHC: 31.8 g/dL (ref 30.0–36.0)
MCV: 91.9 fL (ref 80.0–100.0)
Platelets: 314 10*3/uL (ref 150–400)
RBC: 3.32 MIL/uL — ABNORMAL LOW (ref 4.22–5.81)
RDW: 14.1 % (ref 11.5–15.5)
WBC: 7.2 10*3/uL (ref 4.0–10.5)
nRBC: 0 % (ref 0.0–0.2)

## 2022-08-21 LAB — GLUCOSE, CAPILLARY
Glucose-Capillary: 159 mg/dL — ABNORMAL HIGH (ref 70–99)
Glucose-Capillary: 162 mg/dL — ABNORMAL HIGH (ref 70–99)
Glucose-Capillary: 179 mg/dL — ABNORMAL HIGH (ref 70–99)
Glucose-Capillary: 178 mg/dL — ABNORMAL HIGH (ref 70–99)
Glucose-Capillary: 181 mg/dL — ABNORMAL HIGH (ref 70–99)

## 2022-08-21 MED ORDER — HYDROCORTISONE ACETATE 25 MG RE SUPP
25.0000 mg | Freq: Two times a day (BID) | RECTAL | Status: DC
  Administered 2022-08-21 – 2022-08-29 (×3): 25 mg via RECTAL
  Filled 2022-08-21 (×21): qty 1

## 2022-08-21 NOTE — Progress Notes (Signed)
Occupational Therapy Session Note  Patient Details  Name: Marco Acevedo MRN: [id number] Date of Birth: [birth year]  Today's Date: 08/21/2022 OT Individual Time: 7829-5621 OT Individual Time Calculation (min): 71 min   Short Term Goals: Week 1:  OT Short Term Goal 1 (Week 1): Pt wll don LB garments with CGA using AE OT Short Term Goal 2 (Week 1): Pt will perform stall shower threshold with AD as needed and CGA OT Short Term Goal 3 (Week 1): Pt will complete grooming standing level with close S  Skilled Therapeutic Interventions/Progress Updates:  Pt received sitting in reclienr for skilled OT session with focus on toileting, UE strengthening, and functional mobility. Pt agreeable to interventions, demonstrating overall pleasant mood. Pt reported 6-09/12/08 pain, premedicated. OT offering intermediate rest breaks and positioning suggestions throughout session to address pain/fatigue and maximize participation/safety in session.   Pt ambulates into/out of bathroom, completing 3/3 toileting activities for standing bladder void with CGA + RW. Pt dependent for WC transport to all therapy locations for energy conservation. In main therapy gym, pt participates in series of UE strengthening exercises, including: -Chest press -Overhead press -Bicep curls  Pt performs 2 sets/20 reps with 5# dowel bar, pt shares he goes to the gym 2 x week.   In ADL apartment, pt and OT discuss home bathroom setup. Pt has walk-in shower with built-in seat. Pt performs walk-in shower transfer with Mod A + bilateral UE support on RW and table edge. Pt enters with RLE for confidence due it being the first attempt.   Pt then ambulates from ADL apartment towards main therapy gym, ~50 ft, with CGA-close supervision + RW.   Pt remained sitting in recliner with all immediate needs met at end of session. Pt continues to be appropriate for skilled OT intervention to promote further functional independence.   Therapy  Documentation Precautions:  Precautions Precautions: Fall Precaution Comments: WBAT, no precautions Restrictions Weight Bearing Restrictions: Yes LLE Weight Bearing: Weight bearing as tolerated   Therapy/Group: Individual Therapy  Lou Cal, OTR/L, MSOT  08/21/2022, 6:32 AM

## 2022-08-21 NOTE — Progress Notes (Signed)
Met with patient. Wife at bedside.  Discussed team conference on every Tuesday and SW will follow up with updates. Patient reports that can't go home if not able to get around. Feels that going backwards with therapy. Wife reports that patient is confused. Patient reports that leg feels like it weights 300 lbs and he can't take steps. Patient reports that walking 60' with RW. He says that can't move from side to side or front to back and unable to bear weight.  Reports that the dressing bothers him, pain, and weight of limb.  Patient went on to say that was doing really well until he wasn't able to get his inhaler. [place] Spaniel that gets different stories and everyone wants to give him nebs instead of his inhaler. Informed that sometimes they are stopped by respiratory.  Currently, both are available.  Informed him to ask for his inhaler and that doesn't want a neb. Says that get SOB and needs the inhaler. Also discussed diet A1C and cholesterol including Trig of 156.  He uses Atkins shakes and puts a lot of fruit in the shake.  Encouraged to cut back on the fruit.  Also Aktins shakes are loaded with all Fats and would reduce cholesterol as well as A1C from just by cutting that out and switching to Organic Plant base protein and only use half of a banana.  Patient also reports that since surgery his tremors have increased.  Currently on Primidone.  All needs met, call bell in reach.

## 2022-08-21 NOTE — Progress Notes (Signed)
+/-   sleep.  At 0136, PRN oxy ir 10mg 's given and maalox. Complains of achy pain to left hip, bruising and edema noted. Surgical dressings in place. At 0508, audible wheeze, PRN albuterol neb treatment given. Using I.S, he gets it up to 1250-1400. Using urinal, urgency at times, 1 incontinent episode thus far this shift. LBM 05/12. Refused scheduled senna s at HS. Marco Acevedo

## 2022-08-21 NOTE — Progress Notes (Signed)
PROGRESS NOTE   Subjective/Complaints: Slept OK Had volcanic BM yesterday and 2-3 afterwards- needed that to get cleaned out.  Has bladder urgency- when needs to go, has a couple of minutes and then wet.  "Wearing nursing out".   ROS:  Pt denies SOB, abd pain, CP, N/V/C/D, and vision changes  Except for HPI  Objective:   DG Chest 2 View  Result Date: 08/20/2022 CLINICAL DATA:  Shortness of breath EXAM: CHEST - 2 VIEW COMPARISON:  08/10/2022 FINDINGS: Cardiac shadow is stable. Aortic calcifications are seen. Lungs are clear. No bony abnormality is noted. IMPRESSION: No active cardiopulmonary disease. Electronically Signed   By: Alcide Clever M.D.   On: 08/20/2022 20:07   Recent Labs    08/21/22 0736  WBC 7.2  HGB 9.7*  HCT 30.5*  PLT 314    Recent Labs    08/21/22 0736  NA 130*  K 4.3  CL 96*  CO2 23  GLUCOSE 185*  BUN 57*  CREATININE 2.71*  CALCIUM 8.8*     Intake/Output Summary (Last 24 hours) at 08/21/2022 1916 Last data filed at 08/21/2022 1811 Gross per 24 hour  Intake 1320 ml  Output 1650 ml  Net -330 ml        Physical Exam: Vital Signs Blood pressure (!) 141/68, pulse 69, temperature 98 F (36.7 C), resp. rate 20, height 5\' 8"  (1.727 m), weight 88.7 kg, SpO2 94 %.    General: awake, alert, appropriate, sitting up in bed; nursing in room; NAD HENT: conjugate gaze; oropharynx moist CV: regular rate; no JVD Pulmonary: CTA B/L; no W/R/R- good air movement GI: soft, NT, ND, (+)BS Psychiatric: appropriate- pleasant; talkative Neurological: Ox3 Skin: L hip dressing in place Extremities: no LE edema seen Skin: Warm and dry, dressing on L lateral hip MS: LLE strength 2-/5 in L HF, KE, DF and PF at 4-4+/5- limited by pain  Assessment/Plan: 1. Functional deficits which require 3+ hours per day of interdisciplinary therapy in a comprehensive inpatient rehab setting. Physiatrist is providing  close team supervision and 24 hour management of active medical problems listed below. Physiatrist and rehab team continue to assess barriers to discharge/monitor patient progress toward functional and medical goals  Care Tool:  Bathing    Body parts bathed by patient: Right arm, Chest, Left arm, Abdomen, Front perineal area, Left upper leg, Right upper leg, Face   Body parts bathed by helper: Right lower leg, Left lower leg     Bathing assist Assist Level: Total Assistance - Patient < 25%     Upper Body Dressing/Undressing Upper body dressing   What is the patient wearing?: Pull over shirt    Upper body assist Assist Level: Set up assist    Lower Body Dressing/Undressing Lower body dressing      What is the patient wearing?: Underwear/pull up, Pants     Lower body assist Assist for lower body dressing: Dependent - Patient 0%     Toileting Toileting    Toileting assist Assist for toileting: Contact Guard/Touching assist     Transfers Chair/bed transfer  Transfers assist     Chair/bed transfer assist level: Minimal Assistance - Patient > 75%  Locomotion Ambulation   Ambulation assist      Assist level: Contact Guard/Touching assist Assistive device: Walker-rolling Max distance: 50   Walk 10 feet activity   Assist     Assist level: Contact Guard/Touching assist Assistive device: Walker-rolling   Walk 50 feet activity   Assist Walk 50 feet with 2 turns activity did not occur: Safety/medical concerns (pain, weakness)  Assist level: Contact Guard/Touching assist Assistive device: Walker-rolling    Walk 150 feet activity   Assist Walk 150 feet activity did not occur: Safety/medical concerns         Walk 10 feet on uneven surface  activity   Assist     Assist level: Contact Guard/Touching assist     Wheelchair     Assist Is the patient using a wheelchair?: Yes Type of Wheelchair: Manual    Wheelchair assist level:  Supervision/Verbal cueing Max wheelchair distance: 150 ft    Wheelchair 50 feet with 2 turns activity    Assist        Assist Level: Supervision/Verbal cueing   Wheelchair 150 feet activity     Assist      Assist Level: Supervision/Verbal cueing   Blood pressure (!) 141/68, pulse 69, temperature 98 F (36.7 C), resp. rate 20, height 5\' 8"  (1.727 m), weight 88.7 kg, SpO2 94 %.  Medical Problem List and Plan: 1. Functional deficits secondary to left intertrochanteric femur fracture S/P IM nail and ORIF- is WBAT             -patient may shower             -ELOS/Goals: 7 days modI             Con't CIR PT and OT- WBAT 2.  Antithrombotics: -DVT/anticoagulation:  Pharmaceutical: Lovenox 30 mg X 4 weeks             -antiplatelet therapy: aspirin 81 mg daily   3. Pain Management: Tylenol as needed             -continue gabapentin 300 mg BID             -continue Lidoderm daily   5/9- switched gabapentin to 600 mg QHS since pt only took 300 mg QHS at home- also on Oxy 5-10 mg q4 hours prn- asked pt to take prior to AM therapy.   5/10-reports pain controlled overall, continue current regimen  5/13- peeing better 4. Mood/Behavior/Sleep: LCSW to evaluate and provide emotional support             -antipsychotic agents: n/a   5. Neuropsych/cognition: This patient is capable of making decisions on his own behalf.   6. Skin/Wound Care: Routine skin care checks   5/9- Dressing in place- con't dressings 7. Fluids/Electrolytes/Nutrition: Routine Is and Os and follow-up chemistries             -carb modified diet   8: Hypertension: monitor TID and prn             -continue Norvasc 5  mg daily             -continue Coreg 3.125 mg BID             -continue Imdur 120 mg daily   5/9- BP labile- 110s-180s in last 24 hours- will monitor for trend  -5/10 BP controlled overall, continue current regimen however will schedule Coreg earlier in the morning per patient's request     08/21/2022    1:47 PM  08/21/2022    5:10 AM 08/21/2022    5:00 AM  Vitals with BMI  Height 5\' 8"     Weight   195 lbs 9 oz  BMI   29.74  Systolic  141   Diastolic  68   Pulse  69     9: Hyperlipidemia: continue statin     10: BPH/urinary frequency/retention: continue Proscar   11: History of gout: continue allopurinol   12: Left hip fracture s/p IM nail 5/03 Dr. Blanchie Dessert             -WBAT LLE             -change dressing as needed for soiling/if wet   5.13- looks good- asked nursing to change, since folding up constantly.  13: CAD: s/p stenting X 2; stable. On statin, aspirin and BB. Restart Plavix   14: CKD stage IIIb>>IV: baseline Cr ~2.5 (home on Farxiga)             -follow-up BMP   5/9- Will restart Farxiga-   5/13- Cr up slightly to 2.71 and BUN 57- will recheck Wednesday and if not better, will need to give some fluids.   15: COPD: stable             -continue albuterol inhaler prn phlegm   16: GERD/HH: continue Protonix and Carafate   17: OSA: not using CPAP (he is not clear regarding results of sleep study)   18: DM-2: CBGs QID and SSI (home on glipizide and Farxiga)  A1c 6.5%   5/9- will not restart Glipizide Cr is 2.42- will restart Farxiga 10 mg daily.   -5/10 CBGs a little improved, continue to monitor with restart of farxiga  5/13- CBG's 152-179 in last 24 hours- improved- will NOT restart glipizide 19: Chronic diastolic CHF: continue meds as above             -daily weight   5/9- Weight 88.2 kg today  5/10 wt stable,continue to follow  5/11: weight reviewed and has decreased  5/13- Weight up slightly 0.6 kg- will monitor Filed Weights   08/19/22 0542 08/20/22 0639 08/21/22 0500  Weight: 88.1 kg 88.4 kg 88.7 kg    20: ABLA, chronic anemia: follow-up CBC             -restart PO iron   5/9- Hb up to 10- con't to monitor  5/13- Hb slightly decreased to 9.7 21: Pruritus/rash on back: will order Sarna              22: Tremors: continue primidone    23. Suboptimal vitamin D: start ergocalciferol 50,000U once per week   23. Hypotension: currently hypotensive, will d/c amlodipine  24. Loose stools: discussed that these have improved  25. Indigestion: may overlap with chronic angina, discussed that his outpatient cardiologist prescribed nitroglycerin and this gives him relief, ordered here.   26. Shortness of breath: Albuterol ordered  27. Upper airway congestion: incentive spirometer ordered  28. Desaturation to 82 percent: CXR ordered   5/13- CXR (-) for anything acute 29. Urinary frequency and urgency  5/13- U/A (-) except for high glucose level- has order for timed voiding- will con't.  30. Anal hemorrhoids  5/13- add Anusol suppository per pt request.    I spent a total of 41   minutes on total care today- >50% coordination of care- due to  Prolonged time with pt, speaking with nursing x3 today about U/A, L hip bandage and meds LOS:  5 days A FACE TO FACE EVALUATION WAS PERFORMED  Marco Acevedo 08/21/2022, 7:16 PM

## 2022-08-21 NOTE — Progress Notes (Signed)
Physical Therapy Session Note  Patient Details  Name: Marco Acevedo MRN: [id number] Date of Birth: 1935-04-01  Today's Date: 08/21/2022 PT Individual Time: 7425-9563 PT Individual Time Calculation (min): 73 min   Short Term Goals: Week 1:  PT Short Term Goal 1 (Week 1): Pt will require supervision for dynamic standing balance with LRAD PT Short Term Goal 2 (Week 1): Pt will require min A for stair negotiation PT Short Term Goal 3 (Week 1): Pt will require supervision for gait 50 ft with LRAD  Skilled Therapeutic Interventions/Progress Updates:      Therapy Documentation Precautions:  Precautions Precautions: Fall Precaution Comments: WBAT, no precautions Restrictions Weight Bearing Restrictions: Yes LLE Weight Bearing: Weight bearing as tolerated  Pt received seated in recliner at bedside, agreeable to PT session with emphasis on bed mobility and LE strength training. Pt with unrated left hip pain, pre-medicated and provided rest and repositioning for relief.   Pt CGA with ambulatory transfer to w/c and transported dependently by w/c to main gym.   Pt requires min/mod A for supine <>sit transfers as pt largely limited due to left hip pain and unable to lift and mobilize left leg with bed mobility. PT provided pt with leg loop to assist LE mobilization and pt unsuccessful. Pt performed supine <>sit x 2.   In supine position pt performed SAQ bilaterally 3 x 10,  L LE quad sets 1 x 5 and gluteal bridges 3 x 8 to address LE strength deficits.   Pt requested to return to room for toileting and with successful void utilizing the urinal.   Pt left seated in recliner at bedside with all needs in reach and alarm on.     Therapy/Group: Individual Therapy  Truitt Leep Truitt Leep PT, DPT  08/21/2022, 7:52 AM

## 2022-08-21 NOTE — Progress Notes (Signed)
Occupational Therapy Session Note  Patient Details  Name: Marco Acevedo MRN: [id number] Date of Birth: 02/02/1935  Today's Date: 08/21/2022 OT Individual Time: 4098-1191 OT Individual Time Calculation (min): 45 min    Short Term Goals: Week 1:  OT Short Term Goal 1 (Week 1): Pt wll don LB garments with CGA using AE OT Short Term Goal 2 (Week 1): Pt will perform stall shower threshold with AD as needed and CGA OT Short Term Goal 3 (Week 1): Pt will complete grooming standing level with close S  Skilled Therapeutic Interventions/Progress Updates:  Skilled OT intervention completed with focus on ADL retraining, functional transfers. Pt received seated in recliner, agreeable to session. Unrated pain reported in L hip; per chart, meds given shortly before session. OT offered rest breaks and repositioning throughout for pain reduction.  Pt was verbose and easily distracted during session. OT offered choices for therapy tasks to increase autonomy and independence in prep for DC however pt with difficulty stating "I just want to do whatever I need to get let out of the hospital."  Close supervision sit > stand using RW then CGA ambulatory transfer with RW to w/c at sink. Seated at sink, pt needed cues for attending to self-care task, but was able to shave face, wash UB and donn shirt with set up A when given more distance.  Donned underwear/pants with min A for threading for time, at the supervision sit > stand level using sink for balance. Pt verbalized urgent need for void with set up A needed for urinal placement in standing. Donned socks/shoes with total A due to time constraint, after encouraging supportive shoe for mobility to reduce pain in L hip throughout the day vs barefoot/grip socks.   Supervision sit > stand and CGA ambulatory transfer using RW back to recliner especially with backwards walking. Pt remained seated in recliner, with belt alarm on/activated, and with all needs in reach at  end of session.   Therapy Documentation Precautions:  Precautions Precautions: Fall Precaution Comments: WBAT, no precautions Restrictions Weight Bearing Restrictions: Yes LLE Weight Bearing: Weight bearing as tolerated    Therapy/Group: Individual Therapy  Melvyn Novas, MS, OTR/L  08/21/2022, 8:49 AM

## 2022-08-22 DIAGNOSIS — S728X2A Other fracture of left femur, initial encounter for closed fracture: Secondary | ICD-10-CM | POA: Diagnosis not present

## 2022-08-22 LAB — GLUCOSE, CAPILLARY
Glucose-Capillary: 154 mg/dL — ABNORMAL HIGH (ref 70–99)
Glucose-Capillary: 133 mg/dL — ABNORMAL HIGH (ref 70–99)
Glucose-Capillary: 130 mg/dL — ABNORMAL HIGH (ref 70–99)
Glucose-Capillary: 184 mg/dL — ABNORMAL HIGH (ref 70–99)

## 2022-08-22 LAB — URINE CULTURE: Culture: NO GROWTH

## 2022-08-22 MED ORDER — SORBITOL 70 % SOLN: 30.0000 mL | Freq: Once | Status: DC

## 2022-08-22 MED ORDER — SORBITOL 70 % SOLN
15.0000 mL | Freq: Once | Status: AC
  Administered 2022-08-22: 15 mL via ORAL
  Filled 2022-08-22: qty 30

## 2022-08-22 MED ORDER — DOCUSATE SODIUM 100 MG PO CAPS
100.0000 mg | ORAL_CAPSULE | Freq: Every day | ORAL | Status: DC
  Administered 2022-08-23: 100 mg via ORAL
  Filled 2022-08-22: qty 1

## 2022-08-22 MED ORDER — BISACODYL 5 MG PO TBEC
5.0000 mg | DELAYED_RELEASE_TABLET | Freq: Every day | ORAL | Status: DC
  Administered 2022-08-23 – 2022-08-31 (×5): 5 mg via ORAL
  Filled 2022-08-22 (×8): qty 1

## 2022-08-22 NOTE — Progress Notes (Signed)
Physical Therapy Session Note  Patient Details  Name: Marco Acevedo MRN: [id number] Date of Birth: 03-18-1935  Today's Date: 08/22/2022 PT Individual Time: 4098-1191 PT Individual Time Calculation (min): 71 min   Short Term Goals: Week 1:  PT Short Term Goal 1 (Week 1): Pt will require supervision for dynamic standing balance with LRAD PT Short Term Goal 2 (Week 1): Pt will require min A for stair negotiation PT Short Term Goal 3 (Week 1): Pt will require supervision for gait 50 ft with LRAD  Skilled Therapeutic Interventions/Progress Updates:      Therapy Documentation Precautions:  Precautions Precautions: Fall Precaution Comments: WBAT, no precautions Restrictions Weight Bearing Restrictions: Yes LLE Weight Bearing: Weight bearing as tolerated   Pt received seated in recliner at bedside with nurse tech present. Pt with unrated left hip and groin pain. PT provided rest, repositioning, STM, and flexibility training for relief. Pt CGA with STS and ambulated ~60 ft with RW with increased reliance on bilateral UE's to offset hip pain. Pt unable to correct despite verbal cues. Pt transported total A for time management the remaining distance to dayroom and CGA with ambulatory transfer to Nu Step. Pt propelled Nu Step 2 x 6 min at level 3 to improve ROM and hip pain, pt reports decrease in pain with activity. PT performed STM and passive adductor stretching 5 x ~1 min for pain relief on mat table. Pt able to perform adductor stretch seated EOM 1 x 3 seconds. Pt ambulated ~75 ft with RW CGA and transported remaining distance to room. Pt left seated in recliner at bedside with all needs in reach and alarm on.    Therapy/Group: Individual Therapy  Truitt Leep Truitt Leep PT, DPT  08/22/2022, 7:43 AM

## 2022-08-22 NOTE — Progress Notes (Signed)
Patient ID: MCCRAY ANGERMEIER, male   DOB: 18-Jun-1934, 88 y.o.   MRN: [id number]  Met with pt and son and pt's wife came in to visit him. Updated all regarding team conference goals of mod/I and target discharge date of 5/23. He feels he is doing his best and doesn't like when team says he is not. When asked what he meant he feels he should not be told to try harder. Son tried to clarify the comments but pt felt it was the not the same as his situation here. Have encouraged his wife to observe him in therapies closer to discharge s can see his level and how he is doing. She is usually here at 12:30 each day until after his therapies in the afternoon will have her observe next week when he is doing better and closer to discharge date. Pt wanted to know the result of his chest x-ray and he reports the MD could not tell him did not have the information. Will message her and ask her to address with him tomorrow when rounding. Will work on discharge needs.

## 2022-08-22 NOTE — Progress Notes (Signed)
   08/22/22 1000  Spiritual Encounters  Type of Visit Follow up  Care provided to: Patient  Referral source Chaplain assessment  Reason for visit Routine spiritual support  OnCall Visit No  Spiritual Framework  Presenting Themes Values and beliefs  Values/beliefs Faith  Community/Connection Family;Faith community  Patient Stress Factors Loss of control  Interventions  Spiritual Care Interventions Made Compassionate presence;Reflective listening;Normalization of emotions;Prayer  Intervention Outcomes  Outcomes Awareness of support   Ch met pt in therapy session while rounding the floor. Pt is going through emotional distress because of loss of mobility. He draws hope and strength from his faith. Ch encouraged storytelling and offered a word of prayer. No follow-up needed at this time.

## 2022-08-22 NOTE — Progress Notes (Signed)
Patient refused Prevalon boot,states he does not need it of aware of purpose of the boots. Support provided

## 2022-08-22 NOTE — Progress Notes (Signed)
Patient had verbalize not being able to sleep secondary to urinary urgency and being constantly awaken  just to void.[name] He refused applying on the condom catheters because it would leak or come off. A male purewick was applied for comfort and to allow the patient to sleep.  0200 .Upon hourly rounding this patient appears to be resting well, in not apparent distress or discomfort respiration even and unlabored, coloration normal, Continue medical regime

## 2022-08-22 NOTE — Progress Notes (Signed)
PROGRESS NOTE   Subjective/Complaints:  Pt said slept the best he has due to purewick placed last night, but got kinked and then got wet- frustrated about this and some other staffing concerns pt voiced.   LBM Sunday-  Pain 7/10 this AM, but hasn't had AM meds.  Asking nurse for home Dulcolax tablet daily and something for hard stool.   ROS:  Pt denies SOB, abd pain, CP, N/V/ (+)C/D, and vision changes  Except for HPI  Objective:   DG Chest 2 View  Result Date: 08/20/2022 CLINICAL DATA:  Shortness of breath EXAM: CHEST - 2 VIEW COMPARISON:  08/10/2022 FINDINGS: Cardiac shadow is stable. Aortic calcifications are seen. Lungs are clear. No bony abnormality is noted. IMPRESSION: No active cardiopulmonary disease. Electronically Signed   By: Alcide Clever M.D.   On: 08/20/2022 20:07   Recent Labs    08/21/22 0736  WBC 7.2  HGB 9.7*  HCT 30.5*  PLT 314    Recent Labs    08/21/22 0736  NA 130*  K 4.3  CL 96*  CO2 23  GLUCOSE 185*  BUN 57*  CREATININE 2.71*  CALCIUM 8.8*     Intake/Output Summary (Last 24 hours) at 08/22/2022 0848 Last data filed at 08/22/2022 0754 Gross per 24 hour  Intake 240 ml  Output 1950 ml  Net -1710 ml        Physical Exam: Vital Signs Blood pressure (!) 124/52, pulse 74, temperature 98.2 F (36.8 C), temperature source Oral, resp. rate 18, height 5\' 8"  (1.727 m), weight 90.8 kg, SpO2 90 %.     General: awake, alert, appropriate, standing initially getting cleaned up by NT; NAD HENT: conjugate gaze; oropharynx moist CV: regular rate; no JVD Pulmonary: CTA B/L; no W/R/R- good air movement- but sounds SOB with standing for 5 minutes to get changed GI: soft, NT, ND, (+)BS Psychiatric: appropriate- very irritable Neurological: Ox3  Skin: L hip dressing in place- different dressing- C/D/I Extremities: no LE edema seen Skin: Warm and dry, dressing on L lateral hip MS: LLE  strength 2-/5 in L HF, KE, DF and PF at 4-4+/5- limited by pain  Assessment/Plan: 1. Functional deficits which require 3+ hours per day of interdisciplinary therapy in a comprehensive inpatient rehab setting. Physiatrist is providing close team supervision and 24 hour management of active medical problems listed below. Physiatrist and rehab team continue to assess barriers to discharge/monitor patient progress toward functional and medical goals  Care Tool:  Bathing    Body parts bathed by patient: Right arm, Chest, Left arm, Abdomen, Front perineal area, Left upper leg, Right upper leg, Face   Body parts bathed by helper: Right lower leg, Left lower leg     Bathing assist Assist Level: Total Assistance - Patient < 25%     Upper Body Dressing/Undressing Upper body dressing   What is the patient wearing?: Pull over shirt    Upper body assist Assist Level: Set up assist    Lower Body Dressing/Undressing Lower body dressing      What is the patient wearing?: Underwear/pull up, Pants     Lower body assist Assist for lower body dressing: Dependent -  Patient 0%     Editor, commissioning assist Assist for toileting: Contact Guard/Touching assist     Transfers Chair/bed transfer  Transfers assist     Chair/bed transfer assist level: Minimal Assistance - Patient > 75%     Locomotion Ambulation   Ambulation assist      Assist level: Contact Guard/Touching assist Assistive device: Walker-rolling Max distance: 50   Walk 10 feet activity   Assist     Assist level: Contact Guard/Touching assist Assistive device: Walker-rolling   Walk 50 feet activity   Assist Walk 50 feet with 2 turns activity did not occur: Safety/medical concerns (pain, weakness)  Assist level: Contact Guard/Touching assist Assistive device: Walker-rolling    Walk 150 feet activity   Assist Walk 150 feet activity did not occur: Safety/medical concerns          Walk 10 feet on uneven surface  activity   Assist     Assist level: Contact Guard/Touching assist     Wheelchair     Assist Is the patient using a wheelchair?: Yes Type of Wheelchair: Manual    Wheelchair assist level: Supervision/Verbal cueing Max wheelchair distance: 150 ft    Wheelchair 50 feet with 2 turns activity    Assist        Assist Level: Supervision/Verbal cueing   Wheelchair 150 feet activity     Assist      Assist Level: Supervision/Verbal cueing   Blood pressure (!) 124/52, pulse 74, temperature 98.2 F (36.8 C), temperature source Oral, resp. rate 18, height 5\' 8"  (1.727 m), weight 90.8 kg, SpO2 90 %.  Medical Problem List and Plan: 1. Functional deficits secondary to left intertrochanteric femur fracture S/P IM nail and ORIF- is WBAT             -patient may shower             -ELOS/Goals: 7 days modI             Con't CIR PT and OT  Team conference today to determine length of stay 2.  Antithrombotics: -DVT/anticoagulation:  Pharmaceutical: Lovenox 30 mg X 4 weeks             -antiplatelet therapy: aspirin 81 mg daily   3. Pain Management: Tylenol as needed             -continue gabapentin 300 mg BID             -continue Lidoderm daily   5/9- switched gabapentin to 600 mg QHS since pt only took 300 mg QHS at home- also on Oxy 5-10 mg q4 hours prn- asked pt to take prior to AM therapy.   5/10-reports pain controlled overall, continue current regimen  5/13- peeing better  5/14- they put Purewick on pt last night due to frequency- U/A (-) but has frequency, esp at night-  4. Mood/Behavior/Sleep: LCSW to evaluate and provide emotional support             -antipsychotic agents: n/a   5. Neuropsych/cognition: This patient is capable of making decisions on his own behalf.   6. Skin/Wound Care: Routine skin care checks   5/9- Dressing in place- con't dressings 7. Fluids/Electrolytes/Nutrition: Routine Is and Os and follow-up  chemistries             -carb modified diet   8: Hypertension: monitor TID and prn             -continue Norvasc 5  mg daily             -continue Coreg 3.125 mg BID             -continue Imdur 120 mg daily   5/9- BP labile- 110s-180s in last 24 hours- will monitor for trend  -5/10 BP controlled overall, continue current regimen however will schedule Coreg earlier in the morning per patient's request    08/22/2022    5:03 AM 08/22/2022    4:30 AM 08/21/2022    8:41 PM  Vitals with BMI  Weight  200 lbs 3 oz   BMI  30.44   Systolic 124  142  Diastolic 52  56  Pulse 74  72    9: Hyperlipidemia: continue statin     10: BPH/urinary frequency/retention: continue Proscar   11: History of gout: continue allopurinol   12: Left hip fracture s/p IM nail 5/03 Dr. Blanchie Dessert             -WBAT LLE             -change dressing as needed for soiling/if wet   5.13- looks good- asked nursing to change, since folding up constantly.  13: CAD: s/p stenting X 2; stable. On statin, aspirin and BB. Restart Plavix   14: CKD stage IIIb>>IV: baseline Cr ~2.5 (home on Farxiga)             -follow-up BMP   5/9- Will restart Farxiga-   5/13- Cr up slightly to 2.71 and BUN 57- will recheck Wednesday and if not better, will need to give some fluids.   15: COPD: stable             -continue albuterol inhaler prn phlegm   16: GERD/HH: continue Protonix and Carafate   17: OSA: not using CPAP (he is not clear regarding results of sleep study)   18: DM-2: CBGs QID and SSI (home on glipizide and Farxiga)  A1c 6.5%   5/9- will not restart Glipizide Cr is 2.42- will restart Farxiga 10 mg daily.   -5/10 CBGs a little improved, continue to monitor with restart of farxiga  5/13- CBG's 152-179 in last 24 hours- improved- will NOT restart glipizide  5/14- CBGs 154-181 in last 24 hours- will con't regimen 19: Chronic diastolic CHF: continue meds as above             -daily weight   5/9- Weight 88.2 kg  today  5/10 wt stable,continue to follow  5/11: weight reviewed and has decreased  5/13- Weight up slightly 0.6 kg- will monitor  5/14- weight up to 90.8 kg, however no LE edema and hasn't had BM in 2 days. Will monitor trend Filed Weights   08/20/22 0639 08/21/22 0500 08/22/22 0430  Weight: 88.4 kg 88.7 kg 90.8 kg    20: ABLA, chronic anemia: follow-up CBC             -restart PO iron   5/9- Hb up to 10- con't to monitor  5/13- Hb slightly decreased to 9.7 21: Pruritus/rash on back: will order Sarna              22: Tremors: continue primidone   23. Suboptimal vitamin D: start ergocalciferol 50,000U once per week   23. Hypotension: currently hypotensive, will d/c amlodipine  24. Loose stools: discussed that these have improved  5/14- Now feels constipated- LBM 2 days ago- will give low dose Sorbitol 15cc at 11am, since doesn't want to have BM  on night shift- also, was on Dulcolax tab vs Supp at home daily- will see if needs to add something. Will add Colace 1 tab daily since stool hard and Dulcolax 5 mg daily tablet.   25. Indigestion: may overlap with chronic angina, discussed that his outpatient cardiologist prescribed nitroglycerin and this gives him relief, ordered here.   26. Shortness of breath: Albuterol ordered  27. Upper airway congestion: incentive spirometer ordered  28. Desaturation to 82 percent: CXR ordered   5/13- CXR (-) for anything acute 29. Urinary frequency and urgency  5/13- U/A (-) except for high glucose level- has order for timed voiding- will con't.  30. Anal hemorrhoids  5/13- add Anusol suppository per pt request.    I spent a total of 52   minutes on total care today- >50% coordination of care- due to  Team conference to determine length of stay as well as prolonged d/w pt about his night and nursing due to some pt concerns.    LOS: 6 days A FACE TO FACE EVALUATION WAS PERFORMED  Juvon Teater 08/22/2022, 8:48 AM

## 2022-08-22 NOTE — Progress Notes (Signed)
Occupational Therapy Session Note  Patient Details  Name: Marco Acevedo MRN: [id number] Date of Birth: 07-08-1934  Today's Date: 08/22/2022 OT Individual Time: 1100-1200 OT Individual Time Calculation (min): 60 min    Short Term Goals: Week 1:  OT Short Term Goal 1 (Week 1): Pt wll don LB garments with CGA using AE OT Short Term Goal 2 (Week 1): Pt will perform stall shower threshold with AD as needed and CGA OT Short Term Goal 3 (Week 1): Pt will complete grooming standing level with close S  Skilled Therapeutic Interventions/Progress Updates:   Pt reported difficulty with urine mngt with night tijme and seated urinal use. Standing level urination completed with RW with CGA. Sitting and standing tolerance sink side for oral care and hair care. Max 2 min standing level with RW support. Transported to demo apt to and from via w/c level. Worked on SLS trials with RW and sink side support with Airex Foam 5 reps x 2 sets Bly. Antalgia with R step upp due to WB on L LE all with min A-CGA. Back in room, pt amb back to recliner with CGA. Left recliner level with needs, nurse call button and safety measures in place.    Pain:  Continues to report L groin and hip pain 4/10 with relief with repositioning and meds, as well as seated rests   Therapy Documentation Precautions:  Precautions Precautions: Fall Precaution Comments: WBAT, no precautions Restrictions Weight Bearing Restrictions: Yes LLE Weight Bearing: Weight bearing as tolerated  Therapy/Group: Individual Therapy  Vicenta Dunning 08/22/2022, 7:54 AM

## 2022-08-22 NOTE — Progress Notes (Signed)
Assisted patient with ADL and up to bedside to use urinal, patient became somewhat flustration with basic task but approachable.,

## 2022-08-22 NOTE — Plan of Care (Signed)
  Problem: RH BOWEL ELIMINATION Goal: RH STG MANAGE BOWEL WITH ASSISTANCE Description: STG Manage Bowel with min Assistance. Outcome: Not Progressing; new order for laxatives    Problem: RH BLADDER ELIMINATION Goal: RH STG MANAGE BLADDER WITH ASSISTANCE Description: STG Manage Bladder With min Assistance Outcome: Not Progressing; frequency at night

## 2022-08-22 NOTE — Progress Notes (Signed)
Occupational Therapy Session Note  Patient Details  Name: Marco Acevedo MRN: [id number] Date of Birth: 29-Sep-1934  Today's Date: 08/22/2022 OT Individual Time: 1410-1525 OT Individual Time Calculation (min): 75 min   Short Term Goals: Week 1:  OT Short Term Goal 1 (Week 1): Pt wll don LB garments with CGA using AE OT Short Term Goal 2 (Week 1): Pt will perform stall shower threshold with AD as needed and CGA OT Short Term Goal 3 (Week 1): Pt will complete grooming standing level with close S  Skilled Therapeutic Interventions/Progress Updates:  Pt received resting in recliner for skilled OT session with focus on functional mobility and standing balance/tolerance. Pt agreeable to interventions, demonstrating overall pleasant mood. Pt reported 6-7/10 pain in groin,  stating "it feels like I pulled a muscle." OT offering intermediate rest breaks and positioning suggestions throughout session to address pain/fatigue and maximize participation/safety in session.  Pt ambulates from room towards nurses station, requiring transport at midway point due to pain/fatigue. In day room, pt participates in standing balance activity targeting WB on RLE with step-into staggered stance to place item on mirror. Pt requires CGA + RW, needing extended rest-break to complete second trial for pain management. Pt provided heat pack for pain management in between trials of activity.  Pt then participates in dynamic balance activity with use of ~2 in step to simulate household-level thresholds. Pt completes 2 sets/ 3 reps  -Heel slides w/ 2 # weight x 10 reps/2 sets -Toe to heel w/ 2# weight x 10 reps/2 sets  Pt remained sitting in recliner with all immediate needs met at end of session. Pt continues to be appropriate for skilled OT intervention to promote further functional independence.   Therapy Documentation Precautions:  Precautions Precautions: Fall Precaution Comments: WBAT, no  precautions Restrictions Weight Bearing Restrictions: Yes LLE Weight Bearing: Weight bearing as tolerated   Therapy/Group: Individual Therapy  Lou Cal, OTR/L, MSOT  08/22/2022, 6:22 AM

## 2022-08-22 NOTE — Patient Care Conference (Signed)
Inpatient RehabilitationTeam Conference and Plan of Care Update Date: 08/22/2022   Time: 11:45 AM    Patient Name: Marco Acevedo      Medical Record Number: [id number]  Date of Birth: 1934-11-06 Sex: Male         Room/Bed: 4U98J/1B14N-82 Payor Info: Payor: [employer] / Plan: [facility] / Product Type: *No Product type* /    Admit Date/Time:  08/16/2022  5:44 PM  Primary Diagnosis:  Other fracture of left femur, initial encounter for closed fracture [facility])  Hospital Problems: Principal Problem:   Other fracture of left femur, initial encounter for closed fracture [facility])    Expected Discharge Date: Expected Discharge Date: 08/31/22  Team Members Present: Physician leading conference: Dr. Genice Rouge Social Worker Present: Dossie Der, LCSW Nurse Present: Vedia Pereyra, RN PT Present: Truitt Leep, PT OT Present: Lou Cal, OT     Current Status/Progress Goal Weekly Team Focus  Bowel/Bladder   Patient is continent of bladder/bowel with urinary urgency    Remain continent    Assess toileting needs Q2-4 hrs and prn,    Swallow/Nutrition/ Hydration               ADL's   Setup UB ADLS, Min-Mod A (dependent on pain and fatigue), CGA for toileting   Mod I   LB ADLs, activity tolerance, general conditioning, AE training as appropriate    Mobility   min/mod bed mobility,CGA/min transfers and gait ~110 ft with RW, mod steps x 8 (3 inches)   mod I  bed mobility, gait, activity tolerance, L LE strength and pain management    Communication                Safety/Cognition/ Behavioral Observations               Pain   S/P ORIF-Left hip; rate pain score 5-8/10 on pain scale current meds for pain continue Oxycodone 10mg  po   pain 3/10 on pain scale   Assess Pain Q-4 hrs and prn    Skin   Left surgical hip healing well, tenderness, soreness, edema subsiding,bruising, intact with skin glue no drainage,   prevention of  incisional infection while on IP Rehab  Assess skin and incision site daily and prn      Discharge Planning:  Home with wife who is not able to assist but can provide supervision level, his son and daughter in-law are involved and will be checking in on him. Pt asked for worker to order rollator from [place] has regular rolling walker to use until is safe to use rollator. [facility]. Await other recommnedations   Team Discussion: Left femur fracture. LLE-WBAT. Very self limiting with therapies. Working on pain control. CKD worse. Encourage to drink more. Daily weights.  Patient on target to meet rehab goals: yes, gain 63' with RW and 8 3in steps mod assist.   *See Care Plan and progress notes for long and short-term goals.   Revisions to Treatment Plan:  Mediation adjustments, Neuro-psych to see.  Teaching Needs: Medications, safety, self care, gait/transfer training, skin/wound care, etc.   Current Barriers to Discharge: Decreased caregiver support, Wound care, Weight, and Behavior  Possible Resolutions to Barriers: Family education, wound care, diet education, order recommended DME     Medical Summary Current Status: on timed toileting- continent, but up all night- asked for purewick- L hip pain 7/10- "not controlled".  Barriers to Discharge: Uncontrolled Pain;Medical stability;Behavior/Mood;Self-care education;Weight bearing restrictions;Uncontrolled  Diabetes;Renal Insufficiency/Failure;Morbid Obesity;Incontinence  Barriers to Discharge Comments: incontinent overnight- not receptive to whatever staff explained- no follow through- scared/self limiting- says canot do anything, but accomplishing all his goals Possible Resolutions to [employer] Focus: has done 8 steps/110 ft RW- mod I goals- working on self limiting behaviors- 5/23- have neuropsych to see him   Continued Need for Acute Rehabilitation Level of Care: The patient requires daily medical management  by a physician with specialized training in physical medicine and rehabilitation for the following reasons: Direction of a multidisciplinary physical rehabilitation program to maximize functional independence : Yes Medical management of patient stability for increased activity during participation in an intensive rehabilitation regime.: Yes Analysis of laboratory values and/or radiology reports with any subsequent need for medication adjustment and/or medical intervention. : Yes   I attest that I was present, lead the team conference, and concur with the assessment and plan of the team.   Jearld Adjutant 08/22/2022, 4:06 PM

## 2022-08-23 ENCOUNTER — Inpatient Hospital Stay (HOSPITAL_COMMUNITY): Payer: No Typology Code available for payment source

## 2022-08-23 DIAGNOSIS — S728X2A Other fracture of left femur, initial encounter for closed fracture: Secondary | ICD-10-CM | POA: Diagnosis not present

## 2022-08-23 LAB — GLUCOSE, CAPILLARY
Glucose-Capillary: 148 mg/dL — ABNORMAL HIGH (ref 70–99)
Glucose-Capillary: 135 mg/dL — ABNORMAL HIGH (ref 70–99)
Glucose-Capillary: 161 mg/dL — ABNORMAL HIGH (ref 70–99)
Glucose-Capillary: 145 mg/dL — ABNORMAL HIGH (ref 70–99)

## 2022-08-23 LAB — BASIC METABOLIC PANEL
Anion gap: 8 (ref 5–15)
BUN: 52 mg/dL — ABNORMAL HIGH (ref 8–23)
CO2: 26 mmol/L (ref 22–32)
Calcium: 9.1 mg/dL (ref 8.9–10.3)
Chloride: 100 mmol/L (ref 98–111)
Creatinine, Ser: 2.54 mg/dL — ABNORMAL HIGH (ref 0.61–1.24)
GFR, Estimated: 24 mL/min — ABNORMAL LOW (ref >60)
Glucose, Bld: 206 mg/dL — ABNORMAL HIGH (ref 70–99)
Potassium: 4.6 mmol/L (ref 3.5–5.1)
Sodium: 134 mmol/L — ABNORMAL LOW (ref 135–145)

## 2022-08-23 MED ORDER — POLYETHYLENE GLYCOL 3350 17 G PO PACK
17.0000 g | PACK | Freq: Every day | ORAL | Status: DC
  Administered 2022-08-24 – 2022-08-28 (×3): 17 g via ORAL
  Filled 2022-08-23 (×5): qty 1

## 2022-08-23 NOTE — Progress Notes (Signed)
Recreational Therapy Session Note  Patient Details  Name: Marco Acevedo MRN: [id number] Date of Birth: May 17, 1934 Today's Date: 08/23/2022  Pain:no c/o  Pt participated in animal assisted activity seated in recliner with supervision.  Pt easily engaged with pet partner team and appreciative of this visit.  Gisell Buehrle 08/23/2022, 3:24 PM

## 2022-08-23 NOTE — Progress Notes (Signed)
Occupational Therapy Session Note  Patient Details  Name: Marco Acevedo MRN: [id number] Date of Birth: [birth year]  Today's Date: 08/23/2022 OT Individual Time: 4098-1191 OT Individual Time Calculation (min): 71 min   Today's Date: 08/23/2022 OT Individual Time: 1335-1420 OT Individual Time Calculation (min): 45 min   Short Term Goals: Week 1:  OT Short Term Goal 1 (Week 1): Pt wll don LB garments with CGA using AE OT Short Term Goal 2 (Week 1): Pt will perform stall shower threshold with AD as needed and CGA OT Short Term Goal 3 (Week 1): Pt will complete grooming standing level with close S  Skilled Therapeutic Interventions/Progress Updates:   Session 1: Pt received sitting in recliner for skilled OT session with focus on general conditioning . Pt agreeable to interventions, demonstrating overall pleasant mood. Pt with un-rated pain at surgical site, stating "I stay at 2-3% pain in my hip." OT offering intermediate rest breaks and positioning suggestions throughout session to address pain/fatigue and maximize participation/safety in session.   Pt ambulates from room towards nurses station, reaching midway point before requiring seated rest break. Pt overall close supervision + RW for room-level mobility. Pt dependent for remainder of transport to main therapy gym.   In therapy gym, pt participates in recipricol ball-toss activity with 1# & 3# dowel bar. Pt performs 20 reps/3 sets. Pt then participates in series of core strengthening exercises, including: -Modified sit-ups -Torso twists -Diagonal reaches  Pt performs 1 set/10 reps of each exercise, using 4 # medicine ball for 2/3 exercises.  Pt ambulates from main therapy gym towards 4W nurses station, reaching elevators, all with CGA + RW.   Pt remained sitting in recliner with all immediate needs met at end of session. Pt continues to be appropriate for skilled OT intervention to promote further functional independence.   Session  2: Pt received sitting in recliner for skilled OT session with focus on BADL retraining. Pt agreeable to interventions, demonstrating overall pleasant mood. Pt with un-rated pain. OT offering intermediate rest breaks and positioning suggestions throughout session to address pain/fatigue and maximize participation/safety in session.   Pt ambulates room-level distance into/out of shower with close supervision + RW. Pt performs shower transfer with use of grab bars, TTB, and RW, requiring only supervision for safety. Pt completes full-body shower with close supervision provided for standing balance while completing peri-area hygiene. Pt using long-handled sponge for remainder of body. Pt requires A to doff L-sock, managing R with figure-4 technique. Pt requires increased A for LB dressing due to time constraints. Pt handed off to NT for remainder of ADL tasks at sink-side. Wife present to provided any needed setup A.   Pt remained sitting in WC at sink-side with all immediate needs met at end of session. Pt continues to be appropriate for skilled OT intervention to promote further functional independence.   Therapy Documentation Precautions:  Precautions Precautions: Fall Precaution Comments: WBAT, no precautions Restrictions Weight Bearing Restrictions: Yes LLE Weight Bearing: Weight bearing as tolerated   Therapy/Group: Individual Therapy  Lou Cal, OTR/L, MSOT  08/23/2022, 6:11 AM

## 2022-08-23 NOTE — Progress Notes (Signed)
Recreational Therapy Assessment and Plan  Patient Details  Name: Marco Acevedo MRN: [id number] Date of Birth: 03/18/1935 Today's Date: 08/23/2022  Rehab Potential:  Good ELOS:   d/c 5/23  Assessment Hospital Problem: Principal Problem:   Other fracture of left femur, initial encounter for closed fracture [facility])     Past Medical History:      Past Medical History:  Diagnosis Date   Anemia      low iron   Aortic stenosis     Arthritis     BPH (benign prostatic hyperplasia)     CAD (coronary artery disease)     Chronic diastolic CHF (congestive heart failure) (HCC)     CKD (chronic kidney disease), stage IV (HCC)     Claustrophobia     Complication of anesthesia      has to have head elevated to keep from strangling on post nasal drip   COPD (chronic obstructive pulmonary disease) (HCC)     Diabetes mellitus type II      type 2   GERD (gastroesophageal reflux disease)     Gout     Granuloma annulare     History of hiatal hernia     History of kidney stones      Litthotrispy   Hyperlipidemia     Hypertension     Neuropathy     Obesity     OSA (obstructive sleep apnea)     Stroke (HCC) 10/2017    Weakness right arm and leg    Past Surgical History:       Past Surgical History:  Procedure Laterality Date   BLEPHAROPLASTY       CARDIAC CATHETERIZATION   [id number]    X 2 stents   CHOLECYSTECTOMY N/A 09/23/2014    Procedure: LAPAROSCOPIC CHOLECYSTECTOMY WITH INTRAOPERATIVE CHOLANGIOGRAM;  Surgeon: Chevis Pretty [name], MD;  Location: [facility] OR;  Service: General;  Laterality: N/A;   COLONOSCOPY       COLONOSCOPY WITH PROPOFOL N/A 03/22/2016    Procedure: COLONOSCOPY WITH PROPOFOL;  Surgeon: Charlott Rakes, MD;  Location: [facility] ENDOSCOPY;  Service: Endoscopy;  Laterality: N/A;   ENDARTERECTOMY Left 10/19/2017    Procedure: ENDARTERECTOMY CAROTID LEFT;  Surgeon: Larina Earthly, MD;  Location: [facility] OR;  Service: Vascular;  Laterality: Left;   ESOPHAGOGASTRODUODENOSCOPY (EGD) WITH  PROPOFOL N/A 03/22/2016    Procedure: ESOPHAGOGASTRODUODENOSCOPY (EGD) WITH PROPOFOL;  Surgeon: Charlott Rakes, MD;  Location: [facility] ENDOSCOPY;  Service: Endoscopy;  Laterality: N/A;   EYE SURGERY        both cataracts   INTRAMEDULLARY (IM) NAIL INTERTROCHANTERIC Left 08/11/2022    Procedure: INTRAMEDULLARY (IM) NAIL INTERTROCHANTERIC;  Surgeon: Joen Laura, MD;  Location: [facility] OR;  Service: Orthopedics;  Laterality: Left;   KNEE ARTHROSCOPY WITH MEDIAL MENISECTOMY Right 08/19/2013    Procedure: RIGHT KNEE ARTHROSCOPY WITH PARTIAL MEDIAL MENISECTOMY AND CHONDROPLASTY;  Surgeon: Velna Ochs, MD;  Location: [facility];  Service: Orthopedics;  Laterality: Right;   LEFT HEART CATH AND CORONARY ANGIOGRAPHY N/A 06/28/2016    Procedure: Left Heart Cath and Coronary Angiography;  Surgeon: Lyn Records, MD;  Location: [facility] INVASIVE CV LAB;  Service: Cardiovascular;  Laterality: N/A;   LEFT HEART CATH AND CORONARY ANGIOGRAPHY N/A 02/25/2019    Procedure: LEFT HEART CATH AND CORONARY ANGIOGRAPHY;  Surgeon: Lyn Records, MD;  Location: [facility] INVASIVE CV LAB;  Service: Cardiovascular;  Laterality: N/A;   LEFT HEART CATHETERIZATION WITH CORONARY ANGIOGRAM N/A 10/03/2011    Procedure: LEFT  HEART CATHETERIZATION WITH CORONARY ANGIOGRAM;  Surgeon: Lesleigh Noe, MD;  Location: [facility] CATH LAB;  Service: Cardiovascular;  Laterality: N/A;   LEFT HEART CATHETERIZATION WITH CORONARY ANGIOGRAM N/A 09/12/2012    Procedure: LEFT HEART CATHETERIZATION WITH CORONARY ANGIOGRAM;  Surgeon: Lesleigh Noe, MD;  Location: [facility] CATH LAB;  Service: Cardiovascular;  Laterality: N/A;   LIPOMA EXCISION        Biospy only; left parotid gland   LITHOTRIPSY       none       PERCUTANEOUS CORONARY STENT INTERVENTION (PCI-S) N/A 09/17/2012    Procedure: PERCUTANEOUS CORONARY STENT INTERVENTION (PCI-S);  Surgeon: Lesleigh Noe, MD;  Location: [facility] CATH LAB;  Service: Cardiovascular;  Laterality: N/A;   TRIGGER FINGER  RELEASE Left 12/21/2015    Procedure: LEFT LONG FINGER TRIGGER RELEASE ;  Surgeon: Mack Hook, MD;  Location: [facility];  Service: Orthopedics;  Laterality: Left;      Assessment & Plan Clinical Impression:   Marco Acevedo is an 88 year old male who presented to the [facility] ED on 08/10/2022 after a fall when he missed stepped on a stair and fell to his left side, Complained of severe left buttock pain and was unable to get up. X-rays showed a left hip fx and orthopedic surgery was consulted. He lives at home with his wife and does not use any assistive devices to ambulate. Trauma scan head and neck CT negative, left elbow negative for fracture or dislocation, positive left intertrochanter fracture. Cardiology consulted prior to surgical repair. 2D echo updated. Echocardiogram revealed mild to moderate aortic stenosis and regurgitation, normal ejection fraction and grade 1 diastolic dysfunction. Underwent IM nail/ORIF by Dr. Blanchie Dessert on 5/03. He is WBAT LLE. CT pelvis performed. No acute pelvic fracture. Tolerating diet. The patient requires inpatient medicine and rehabilitation evaluations and services for ongoing dysfunction secondary to left hip fracture.   Ambulating over 100 feet. He goes to the gym 2 days a week doing many strength training exercises and plays golf regularly as well. He is very motivated to return to his active lifestyle.   Pt presents with decreased activity tolerance, decreased functional mobility, decreased balance, feelings of stress Limiting pt's independence with leisure/community pursuits.  Met with pt today to discuss TR services including leisure education, activity analysis/modifications and stress management.  Also discussed the importance of social, emotional, spiritual health in addition to physical health and their effects on overall health and wellness.  Pt stated understanding.  Plan  Mi 1 TR session >20 minutes during LOS  Recommendations for  other services: None   Discharge Criteria: Patient will be discharged from TR if patient refuses treatment 3 consecutive times without medical reason.  If treatment goals not met, if there is a change in medical status, if patient makes no progress towards goals or if patient is discharged from hospital.  The above assessment, treatment plan, treatment alternatives and goals were discussed and mutually agreed upon: by patient  Marco Acevedo 08/23/2022, 3:29 PM

## 2022-08-23 NOTE — Progress Notes (Signed)
Physical Therapy Session Note  Patient Details  Name: MYRTLE PITCOCK MRN: [id number] Date of Birth: 12-05-1934  Today's Date: 08/23/2022 PT Individual Time: 0843-1000 PT Individual Time Calculation (min): 77 min   Short Term Goals: Week 1:  PT Short Term Goal 1 (Week 1): Pt will require supervision for dynamic standing balance with LRAD PT Short Term Goal 2 (Week 1): Pt will require min A for stair negotiation PT Short Term Goal 3 (Week 1): Pt will require supervision for gait 50 ft with LRAD  Skilled Therapeutic Interventions/Progress Updates:      Therapy Documentation Precautions:  Precautions Precautions: Fall Precaution Comments: WBAT, no precautions Restrictions Weight Bearing Restrictions: Yes LLE Weight Bearing: Weight bearing as tolerated  Pt received semi-reclined in bed, agreeable to PT and reports 2/10 left hip pain that has improved compared to previous days.   Pt CGA with ambulatory transfer with RW to w/c and transported dependently for energy conservation to dayroom. Pt propelled Nu Step 2 x 6 min at level 3 to address ROM and activity tolerance deficits. Pt reports decreased pain with mobility.   Pt CGA with ambulatory transfer to mat table ~10 ft with RW and performed seated ball circles 2 x 15  with L foot to improve hip and ankle ROM. Pt urgently requested to utilize restroom and CGA with ambulatory transfer and toileting in standing position in public restroom.   Pt returned to mat table and recreational therapist present for conversation to help increase pt's mood.   Pt transported to room by w/c and left seated in recliner at bedside with all needs in reach and alarm on.     Therapy/Group: Individual Therapy  Truitt Leep Truitt Leep PT, DPT  08/23/2022, 7:29 AM

## 2022-08-23 NOTE — Progress Notes (Signed)
PROGRESS NOTE   Subjective/Complaints:  Pt reports slept well from 10pm to 6-7am- due to purewick- insistent that he has at night- because "won't need when he goes home, since will be walking".   Also c/o "knot" on L hip- thinks his hip is "out of socket" - he has no increased pain, and able to stand, however really thinks it's displaced. Will get xray.   Admits memory not good.  Had "blow out" on [facility] yesterday and thinks now we need ot decrease bowel meds.      ROS:   Pt denies SOB, abd pain, CP, N/V/C/ (+)D, and vision changes   Except for HPI  Objective:   No results found. Recent Labs    08/21/22 0736  WBC 7.2  HGB 9.7*  HCT 30.5*  PLT 314    Recent Labs    08/21/22 0736  NA 130*  K 4.3  CL 96*  CO2 23  GLUCOSE 185*  BUN 57*  CREATININE 2.71*  CALCIUM 8.8*     Intake/Output Summary (Last 24 hours) at 08/23/2022 0843 Last data filed at 08/23/2022 0753 Gross per 24 hour  Intake 717 ml  Output 1050 ml  Net -333 ml        Physical Exam: Vital Signs Blood pressure (!) 121/58, pulse 69, temperature 98.4 F (36.9 C), temperature source Oral, resp. rate 17, height 5\' 8"  (1.727 m), weight 90.8 kg, SpO2 (!) 86 %.      General: awake, alert, sitting up in bedside chair; stood with Supervision with RW; NAD HENT: conjugate gaze; oropharynx moist CV: regular rate; no JVD Pulmonary: CTA B/L; no W/R/R- good air movement GI: soft, NT, ND, (+)BS- normoactive Psychiatric: very irritable and perseverates on nursing concerns and purewick Neurological: Ox3 but poor memory MS: Has a large bump of tissue over L hip incision- like a fluid bump- 1 inch protuberance for ~ 4-5 inches downwards Skin: L hip dressing in place- different dressing- C/D/I Extremities: no LE edema seen Skin: Warm and dry, dressing on L lateral hip MS: LLE strength 2-/5 in L HF, KE, DF and PF at 4-4+/5- limited by  pain  Assessment/Plan: 1. Functional deficits which require 3+ hours per day of interdisciplinary therapy in a comprehensive inpatient rehab setting. Physiatrist is providing close team supervision and 24 hour management of active medical problems listed below. Physiatrist and rehab team continue to assess barriers to discharge/monitor patient progress toward functional and medical goals  Care Tool:  Bathing    Body parts bathed by patient: Right arm, Chest, Left arm, Abdomen, Front perineal area, Left upper leg, Right upper leg, Face   Body parts bathed by helper: Right lower leg, Left lower leg     Bathing assist Assist Level: Total Assistance - Patient < 25%     Upper Body Dressing/Undressing Upper body dressing   What is the patient wearing?: Pull over shirt    Upper body assist Assist Level: Set up assist    Lower Body Dressing/Undressing Lower body dressing      What is the patient wearing?: Underwear/pull up, Pants     Lower body assist Assist for lower body dressing: Dependent - Patient  0%     Toileting Toileting    Toileting assist Assist for toileting: Contact Guard/Touching assist     Transfers Chair/bed transfer  Transfers assist     Chair/bed transfer assist level: Minimal Assistance - Patient > 75%     Locomotion Ambulation   Ambulation assist      Assist level: Contact Guard/Touching assist Assistive device: Walker-rolling Max distance: 50   Walk 10 feet activity   Assist     Assist level: Contact Guard/Touching assist Assistive device: Walker-rolling   Walk 50 feet activity   Assist Walk 50 feet with 2 turns activity did not occur: Safety/medical concerns (pain, weakness)  Assist level: Contact Guard/Touching assist Assistive device: Walker-rolling    Walk 150 feet activity   Assist Walk 150 feet activity did not occur: Safety/medical concerns         Walk 10 feet on uneven surface  activity   Assist      Assist level: Contact Guard/Touching assist     Wheelchair     Assist Is the patient using a wheelchair?: Yes Type of Wheelchair: Manual    Wheelchair assist level: Supervision/Verbal cueing Max wheelchair distance: 150 ft    Wheelchair 50 feet with 2 turns activity    Assist        Assist Level: Supervision/Verbal cueing   Wheelchair 150 feet activity     Assist      Assist Level: Supervision/Verbal cueing   Blood pressure (!) 121/58, pulse 69, temperature 98.4 F (36.9 C), temperature source Oral, resp. rate 17, height 5\' 8"  (1.727 m), weight 90.8 kg, SpO2 (!) 86 %.  Medical Problem List and Plan: 1. Functional deficits secondary to left intertrochanteric femur fracture S/P IM nail and ORIF- is WBAT             -patient may shower             -ELOS/Goals: 7 days modI             d/c 5/23  Con't CIR- PT and OT  Has more swelling of L hip- will order f/u xray of L hip to make sure everything in place 2.  Antithrombotics: -DVT/anticoagulation:  Pharmaceutical: Lovenox 30 mg X 4 weeks             -antiplatelet therapy: aspirin 81 mg daily   3. Pain Management: Tylenol as needed             -continue gabapentin 300 mg BID             -continue Lidoderm daily   5/9- switched gabapentin to 600 mg QHS since pt only took 300 mg QHS at home- also on Oxy 5-10 mg q4 hours prn- asked pt to take prior to AM therapy.   5/10-reports pain controlled overall, continue current regimen  5/13- peeing better  5/14- they put Purewick on pt last night due to frequency- U/A (-) but has frequency, esp at night-   5/15- con't Purewick at night- sleeps well- pt insistent "won't need when goes home" 4. Mood/Behavior/Sleep: LCSW to evaluate and provide emotional support             -antipsychotic agents: n/a   5. Neuropsych/cognition: This patient is capable of making decisions on his own behalf.   6. Skin/Wound Care: Routine skin care checks   5/9- Dressing in place-  con't dressings 7. Fluids/Electrolytes/Nutrition: Routine Is and Os and follow-up chemistries             -  carb modified diet   8: Hypertension: monitor TID and prn             -continue Norvasc 5  mg daily             -continue Coreg 3.125 mg BID             -continue Imdur 120 mg daily   5/9- BP labile- 110s-180s in last 24 hours- will monitor for trend  -5/10 BP controlled overall, continue current regimen however will schedule Coreg earlier in the morning per patient's request  5/15- BP controlled- con't regimen    08/23/2022    5:47 AM 08/22/2022    9:50 PM 08/22/2022    7:45 PM  Vitals with BMI  Systolic 121 136 409  Diastolic 58 67 56  Pulse 69  79    9: Hyperlipidemia: continue statin     10: BPH/urinary frequency/retention: continue Proscar   11: History of gout: continue allopurinol   12: Left hip fracture s/p IM nail 5/03 Dr. Blanchie Dessert             -WBAT LLE             -change dressing as needed for soiling/if wet   5.13- looks good- asked nursing to change, since folding up constantly.  13: CAD: s/p stenting X 2; stable. On statin, aspirin and BB. Restart Plavix   14: CKD stage IIIb>>IV: baseline Cr ~2.5 (home on Farxiga)             -follow-up BMP   5/9- Will restart Farxiga-   5/13- Cr up slightly to 2.71 and BUN 57- will recheck Wednesday and if not better, will need to give some fluids. 5/15- Cr down to 2.54 from 2.71 and BUN slightly better at 52 from 57- will con't to have pt push fluids and recheck next Monday.    15: COPD: stable             -continue albuterol inhaler prn phlegm   16: GERD/HH: continue Protonix and Carafate   17: OSA: not using CPAP (he is not clear regarding results of sleep study)   18: DM-2: CBGs QID and SSI (home on glipizide and Farxiga)  A1c 6.5%   5/9- will not restart Glipizide Cr is 2.42- will restart Farxiga 10 mg daily.   -5/10 CBGs a little improved, continue to monitor with restart of farxiga  5/13- CBG's 152-179 in  last 24 hours- improved- will NOT restart glipizide  5/14- CBGs 154-181 in last 24 hours- will con't regimen  5/15- CBGs 130-184- con't regimen 19: Chronic diastolic CHF: continue meds as above             -daily weight   5/9- Weight 88.2 kg today  5/10 wt stable,continue to follow  5/11: weight reviewed and has decreased  5/13- Weight up slightly 0.6 kg- will monitor  5/14- weight up to 90.8 kg, however no LE edema and hasn't had BM in 2 days. Will monitor trend  5/15- no weight today- will ask nursing to do [facility] Weights   08/20/22 0639 08/21/22 0500 08/22/22 0430  Weight: 88.4 kg 88.7 kg 90.8 kg    20: ABLA, chronic anemia: follow-up CBC             -restart PO iron   5/9- Hb up to 10- con't to monitor  5/13- Hb slightly decreased to 9.7 21: Pruritus/rash on back: will order [place]  22: Tremors: continue primidone   23. Suboptimal vitamin D: start ergocalciferol 50,000U once per week   23. Hypotension: currently hypotensive, will d/c amlodipine  24. Loose stools/cosntipation discussed that these have improved  5/14- Now feels constipated- LBM 2 days ago- will give low dose Sorbitol 15cc at 11am, since doesn't want to have BM on night shift- also, was on Dulcolax tab vs Supp at home daily- will see if needs to add something. Will add Colace 1 tab daily since stool hard and Dulcolax 5 mg daily tablet.   5/15- pt said had "blow out"- after sorbitol- will decrease miralax to 17 G daily as well as stop Colace- since he has Dulcolax tab- pt insistent he only needs Dulcolax, but explained on opiates which constipated pt- he needs more meds.   25. Indigestion: may overlap with chronic angina, discussed that his outpatient cardiologist prescribed nitroglycerin and this gives him relief, ordered here.   26. Shortness of breath: Albuterol ordered  27. Upper airway congestion: incentive spirometer ordered  28. Desaturation to 82 percent: CXR ordered   5/13- CXR (-) for  anything acute 29. Urinary frequency and urgency  5/13- U/A (-) except for high glucose level- has order for timed voiding- will con't.  30. Anal hemorrhoids  5/13- add Anusol suppository per pt request.   5/15- says is going a little better   I spent a total of  50  minutes on total care today- >50% coordination of care- due to  D/w Xray over xray ; nursing about pt's concern that displaced L hip, but can stand; and prolonged time with pt- also spoke with nursing director about pt.   LOS: 7 days A FACE TO FACE EVALUATION WAS PERFORMED  Timiyah Romito 08/23/2022, 8:43 AM

## 2022-08-24 DIAGNOSIS — S728X2A Other fracture of left femur, initial encounter for closed fracture: Secondary | ICD-10-CM | POA: Diagnosis not present

## 2022-08-24 LAB — GLUCOSE, CAPILLARY
Glucose-Capillary: 152 mg/dL — ABNORMAL HIGH (ref 70–99)
Glucose-Capillary: 180 mg/dL — ABNORMAL HIGH (ref 70–99)
Glucose-Capillary: 146 mg/dL — ABNORMAL HIGH (ref 70–99)
Glucose-Capillary: 174 mg/dL — ABNORMAL HIGH (ref 70–99)

## 2022-08-24 NOTE — Progress Notes (Signed)
Physical Therapy Weekly Progress Note  Patient Details  Name: Marco Acevedo MRN: [id number] Date of Birth: [birth year]  Beginning of progress report period: Aug 17, 2022 End of progress report period: Aug 24, 2022  Today's Date: 08/24/2022 PT Individual Time: 1030-1100 PT Individual Time Calculation (min): 30 min   Patient has met 1 of 3 short term goals.  Patient is making limited progress to long term goals 2/2 left groin and hip pain. Pt largely requires min/mod A for bed mobility, CGA for STS with RW, and CGA with max gait distance ~110 ft with RW. Pt continues to require min/mod A for stairs due to pain and weakness. Plan to continue pain management, mobility training and pt/family education to prepare for discharge.   Patient continues to demonstrate the following deficits muscle weakness, decreased cardiorespiratoy endurance, decreased coordination, and decreased standing balance, decreased postural control, and decreased balance strategies and therefore will continue to benefit from skilled PT intervention to increase functional independence with mobility.  Patient progressing toward long term goals..  Continue plan of care.  PT Short Term Goals Week 1:  PT Short Term Goal 1 (Week 1): Pt will require supervision for dynamic standing balance with LRAD PT Short Term Goal 1 - Progress (Week 1): Met PT Short Term Goal 2 (Week 1): Pt will require min A for stair negotiation PT Short Term Goal 2 - Progress (Week 1): Not met PT Short Term Goal 3 (Week 1): Pt will require supervision for gait 50 ft with LRAD PT Short Term Goal 3 - Progress (Week 1): Not met Week 2:  PT Short Term Goal 1 (Week 2): STG=LTG due to ELOS  Skilled Therapeutic Interventions/Progress Updates:      Therapy Documentation Precautions:  Precautions Precautions: Fall Precaution Comments: WBAT, no precautions Restrictions Weight Bearing Restrictions: Yes LLE Weight Bearing: Weight bearing as tolerated  Pt  received seated in recliner at bedside reporting left groin pain and L LE weakness. Pt discouraged with fluctuations in progress and PT provided emotional support. Pt CGA with sit to stand with RW and requires supervision for dynamic standing balance with RW as he performed flexibility and ROM exercises to address pain. Pt performed standing adductor stretch and hamstring stretch x 3. Pt agreeable to practice stairs but PT deferred following ambulatory transfer to w/c as pt with decreased weight shift and weight bearing to L LE. Pt performed 1 x 10 of both hip flexion and knee extension to improve strength and mobility. PT notified MD and nurse regarding pain and pt left seated in recliner at bedside with all needs in reach and alarm on.   Therapy/Group: Individual Therapy  Truitt Leep Truitt Leep PT, DPT  08/24/2022, 7:44 AM

## 2022-08-24 NOTE — Group Note (Signed)
Patient Details Name: Marco Acevedo MRN: [id number] DOB: 07-17-1934 Today's Date: 08/24/2022  Time Calculation: OT Group Time Calculation OT Group Start Time: 1430 OT Group Stop Time: 1530 OT Group Time Calculation (min): 60 min      Group Description: Dance Group: Pt participated in dance group with an emphasis on social interaction, motor planning, increasing overall activity tolerance and bimanual tasks. All songs were selected by group members. Dance moves included AROM of BUE/BLE gross motor movements with an emphasis on building functional endurance.    Individual level documentation: Patient completed group from sitting level. Patientt needed supervision to complete various dance moves with cues for safety and body mechanics.  Patient needed min modifications during group.  Pain:  0/10  Precautions:  LLE WBAT  Army Fossa 08/24/2022, 4:47 PM

## 2022-08-24 NOTE — Group Note (Addendum)
Patient Details Name: JERMAR FERRETT MRN: [id number] DOB: 11-08-1934 Today's Date: 08/30/2022 Time:  1440-1530     Group Description:  Goal:  Pt will participate in 60 minute dance group with supervision.  NOT MET  Dance Group: Pt participated in dance group with an emphasis on social interaction, motor planning, increasing overall activity tolerance and bimanual tasks. All songs were selected by group members. Dance moves included AROM of BUE/BLE gross motor movements with an emphasis on building functional endurance.   Pt participated in group for 50 minutes due to toieleting needs.  Individual level documentation: Patient completed group from sitting level. Patient needed supervision to complete various dance moves.  Pts wife present and observing this group.  Pain: Pain Assessment Pain Scale: 0-10 Pain Score: 8  Pain Type: Acute pain Pain Location: Groin Pain Orientation: Left Pain Descriptors / Indicators: Sore Patients Stated Pain Goal: 4 Pain Intervention(s): Medication (See eMAR)No c/o  Precautions:  Precautions:  Precautions Precautions: Fall Precaution Comments: WBAT, no precautions Restrictions Weight Bearing Restrictions: Yes LLE Weight Bearing: Weight bearing as tolerated  Ademide Schaberg 08/30/2022, 8:54 AM

## 2022-08-24 NOTE — Progress Notes (Signed)
Occupational Therapy Session Note  Patient Details  Name: Marco Acevedo MRN: [id number] Date of Birth: 1935/02/03  Today's Date: 08/24/2022 OT Individual Time: 0800-0900 OT Individual Time Calculation (min): 60 min    Short Term Goals: Week 1:  OT Short Term Goal 1 (Week 1): Pt wll don LB garments with CGA using AE OT Short Term Goal 2 (Week 1): Pt will perform stall shower threshold with AD as needed and CGA OT Short Term Goal 3 (Week 1): Pt will complete grooming standing level with close S  Skilled Therapeutic Interventions/Progress Updates:   Pt reports feeling like L LE is weaker today. OT reinforced how much he did yesterday with therapy may result in am stiffness. OT applied R shoulder k taping as per previous and this am report of sh discomfort for support during RW use. OT also spoke with nursing who approved OT to apply L TED hose as pt concerned about weakness, numbness and increased edema. Pt able to move from EOB to RW standing then amb to standard chair set in front of sink with CGA for am self care routine. Seated and intermittent standing performed. OT encouraged use of toilet due to prolonged constipation, pt declined trial. OT trained in foam block squeezes and L LE ankle pumps. Completed session with urinal use standing with S only. See Flowsheets for data. Pt left recliner level with safety measures, needs and nurse call button in reach.    Pain: reports 5/10 L hip and 2/10 R sh, OT applied TED hose to L LE as per ok by nursing for edema mngt, applied K tape to R sh for support  Therapy Documentation Precautions:  Precautions Precautions: Fall Precaution Comments: WBAT, no precautions Restrictions Weight Bearing Restrictions: Yes LLE Weight Bearing: Weight bearing as tolerated    Therapy/Group: Individual Therapy  Vicenta Dunning 08/24/2022, 7:40 AM

## 2022-08-24 NOTE — Progress Notes (Signed)
Physical Therapy Session Note  Patient Details  Name: Marco Acevedo MRN: [id number] Date of Birth: May 28, 1934  Today's Date: 08/24/2022 PT Individual Time: 1102-1200 PT Individual Time Calculation (min): 58 min   Short Term Goals: Week 1:  PT Short Term Goal 1 (Week 1): Pt will require supervision for dynamic standing balance with LRAD PT Short Term Goal 1 - Progress (Week 1): Met PT Short Term Goal 2 (Week 1): Pt will require min A for stair negotiation PT Short Term Goal 2 - Progress (Week 1): Not met PT Short Term Goal 3 (Week 1): Pt will require supervision for gait 50 ft with LRAD PT Short Term Goal 3 - Progress (Week 1): Not met  Skilled Therapeutic Interventions/Progress Updates: Pt presents sitting in recliner and agreeable to therapy, c/o L groin pain.  Nursing in to medicate for pain.  Pt performs seated LE there ex including calf raises and LAQ, 3 x 10-15, AAROM for LLE LAQ to increase to full ROM.  Pt transferred sit to stand throughout session w/ supervision.  Pt amb x 12' w/ RW and CGA to w/c, antalgic gait noted to LLE, but step-through gait pattern.  Pt wheeled to dayroom for energy conservation.  Pt amb x 6' w/ RW to Nu-step.  Pt performed Nu-step at Level 3 8' x 2 w/ 2' rest break between 2/2 pain.  Pt performed average of 64 SPM and total steps of 1030.  Pt amb to w/c including turns w/ cues for safety.  Pt amb 25' from hallway to recliner.  Pt remained sitting w/ chair alarm on and all needs in reach.     Therapy Documentation Precautions:  Precautions Precautions: Fall Precaution Comments: WBAT, no precautions Restrictions Weight Bearing Restrictions: Yes LLE Weight Bearing: Weight bearing as tolerated General:   Vital Signs:  Pain:7/10      Therapy/Group: Individual Therapy  Lucio Edward 08/24/2022, 12:15 PM

## 2022-08-24 NOTE — Progress Notes (Signed)
Had a quite night. Slept very well. Required pain medication one time. Used urinal this morning. Remains alert and oriented. Surgical dressings intact. CBG monitored and correctional insulin administered with meals. Safety maintained.

## 2022-08-24 NOTE — Progress Notes (Signed)
PROGRESS NOTE   Subjective/Complaints:   Pt  initially said was able to sleep great; however then said only slept 3 hours at a time Because his low back is "killing him".  Hasn't tried Lidoderm patches- thought they were salonpas patches.  Wants his therapy later in day- explained we cannot change that.  Ate 100% tray.   ROS:   Pt denies SOB, abd pain, CP, N/V/C/D, and vision changes   Except for HPI  Objective:   DG Pelvis 1-2 Views  Result Date: 08/23/2022 CLINICAL DATA:  [id number] S/p left hip fracture [id number] EXAM: PELVIS - 1-2 VIEW COMPARISON:  CT 08/14/2022 FINDINGS: Postsurgical changes of flexor proximal femur ORIF for an intertrochanteric fracture. Hardware is intact without evidence of loosening. The far distal tip is excluded by collimation. Expected soft tissue changes. Vascular calcifications. No other acute fracture identified. Mild degenerative changes of the hips. IMPRESSION: Postsurgical changes of left proximal femur ORIF. No evidence of hardware complication. Electronically Signed   By: Caprice Renshaw M.D.   On: 08/23/2022 11:01   No results for input(s): "WBC", "HGB", "HCT", "PLT" in the last 72 hours.   Recent Labs    08/23/22 0806  NA 134*  K 4.6  CL 100  CO2 26  GLUCOSE 206*  BUN 52*  CREATININE 2.54*  CALCIUM 9.1     Intake/Output Summary (Last 24 hours) at 08/24/2022 0859 Last data filed at 08/24/2022 0754 Gross per 24 hour  Intake 477 ml  Output 1350 ml  Net -873 ml        Physical Exam: Vital Signs Blood pressure (!) 125/48, pulse 70, temperature 98.7 F (37.1 C), temperature source Oral, resp. rate 20, height 5\' 8"  (1.727 m), weight 87.6 kg, SpO2 91 %.       General: awake, alert, appropriate, sitting up in bedside chair with nurse in room; NAD HENT: conjugate gaze; oropharynx moist CV: regular rate; no JVD Pulmonary: CTA B/L; no W/R/R- good air movement GI: soft, NT, ND,  (+)BS- hypoactive BS Psychiatric: irritable- more chronic Neurological: Ox3 .  MS: Has a large bump of tissue over L hip incision- like a fluid bump- 1 inch protuberance for ~ 4-5 inches downwards- looks about the same Skin: L hip dressing in place- different dressing- C/D/I Extremities: no LE edema seen Skin: Warm and dry, dressing on L lateral hip MS: LLE strength 2-/5 in L HF, KE, DF and PF at 4-4+/5- limited by pain  Assessment/Plan: 1. Functional deficits which require 3+ hours per day of interdisciplinary therapy in a comprehensive inpatient rehab setting. Physiatrist is providing close team supervision and 24 hour management of active medical problems listed below. Physiatrist and rehab team continue to assess barriers to discharge/monitor patient progress toward functional and medical goals  Care Tool:  Bathing    Body parts bathed by patient: Right arm, Chest, Left arm, Abdomen, Front perineal area, Left upper leg, Right upper leg, Face   Body parts bathed by helper: Right lower leg, Left lower leg     Bathing assist Assist Level: Total Assistance - Patient < 25%     Upper Body Dressing/Undressing Upper body dressing   What is the  patient wearing?: Pull over shirt    Upper body assist Assist Level: Set up assist    Lower Body Dressing/Undressing Lower body dressing      What is the patient wearing?: Underwear/pull up, Pants     Lower body assist Assist for lower body dressing: Dependent - Patient 0%     Toileting Toileting    Toileting assist Assist for toileting: Contact Guard/Touching assist     Transfers Chair/bed transfer  Transfers assist     Chair/bed transfer assist level: Minimal Assistance - Patient > 75%     Locomotion Ambulation   Ambulation assist      Assist level: Contact Guard/Touching assist Assistive device: Walker-rolling Max distance: 50   Walk 10 feet activity   Assist     Assist level: Contact Guard/Touching  assist Assistive device: Walker-rolling   Walk 50 feet activity   Assist Walk 50 feet with 2 turns activity did not occur: Safety/medical concerns (pain, weakness)  Assist level: Contact Guard/Touching assist Assistive device: Walker-rolling    Walk 150 feet activity   Assist Walk 150 feet activity did not occur: Safety/medical concerns         Walk 10 feet on uneven surface  activity   Assist     Assist level: Contact Guard/Touching assist     Wheelchair     Assist Is the patient using a wheelchair?: Yes Type of Wheelchair: Manual    Wheelchair assist level: Supervision/Verbal cueing Max wheelchair distance: 150 ft    Wheelchair 50 feet with 2 turns activity    Assist        Assist Level: Supervision/Verbal cueing   Wheelchair 150 feet activity     Assist      Assist Level: Supervision/Verbal cueing   Blood pressure (!) 125/48, pulse 70, temperature 98.7 F (37.1 C), temperature source Oral, resp. rate 20, height 5\' 8"  (1.727 m), weight 87.6 kg, SpO2 91 %.  Medical Problem List and Plan: 1. Functional deficits secondary to left intertrochanteric femur fracture S/P IM nail and ORIF- is WBAT             -patient may shower             -ELOS/Goals: 7 days modI             d/c 5/23  L hip xray just normal swelling- hardware looks OK  Con't CIR PT and OT 2.  Antithrombotics: -DVT/anticoagulation:  Pharmaceutical: Lovenox 30 mg X 4 weeks             -antiplatelet therapy: aspirin 81 mg daily   3. Pain Management: Tylenol as needed             -continue gabapentin 300 mg BID             -continue Lidoderm daily   5/9- switched gabapentin to 600 mg QHS since pt only took 300 mg QHS at home- also on Oxy 5-10 mg q4 hours prn- asked pt to take prior to AM therapy.   5/10-reports pain controlled overall, continue current regimen  5/16- having more sacral pain- L lateral- will have pt try Lidoderm patches he already has ordered, but refused  because thought were Salonpas. 4. Mood/Behavior/Sleep: LCSW to evaluate and provide emotional support             -antipsychotic agents: n/a   5. Neuropsych/cognition: This patient is capable of making decisions on his own behalf.   6. Skin/Wound Care: Routine skin care checks  5/9- Dressing in place- con't dressings 7. Fluids/Electrolytes/Nutrition: Routine Is and Os and follow-up chemistries             -carb modified diet   8: Hypertension: monitor TID and prn             -continue Norvasc 5  mg daily             -continue Coreg 3.125 mg BID             -continue Imdur 120 mg daily   5/9- BP labile- 110s-180s in last 24 hours- will monitor for trend  -5/10 BP controlled overall, continue current regimen however will schedule Coreg earlier in the morning per patient's request  5/16- BP controlled- con't regimen    08/24/2022    2:43 AM 08/23/2022    7:21 PM 08/23/2022    3:16 PM  Vitals with BMI  Weight 193 lbs 2 oz    BMI 29.37    Systolic 125 126 161  Diastolic 48 66 53  Pulse 70 73 66    9: Hyperlipidemia: continue statin     10: BPH/urinary frequency/retention: continue Proscar   11: History of gout: continue allopurinol   12: Left hip fracture s/p IM nail 5/03 Dr. Blanchie Dessert             -WBAT LLE             -change dressing as needed for soiling/if wet   5.13- looks good- asked nursing to change, since folding up constantly.  13: CAD: s/p stenting X 2; stable. On statin, aspirin and BB. Restart Plavix   14: CKD stage IIIb>>IV: baseline Cr ~2.5 (home on Farxiga)             -follow-up BMP   5/9- Will restart Farxiga-   5/13- Cr up slightly to 2.71 and BUN 57- will recheck Wednesday and if not better, will need to give some fluids. 5/15- Cr down to 2.54 from 2.71 and BUN slightly better at 52 from 57- will con't to have pt push fluids and recheck next Monday.    15: COPD: stable             -continue albuterol inhaler prn phlegm   16: GERD/HH: continue  Protonix and Carafate   17: OSA: not using CPAP (he is not clear regarding results of sleep study)   18: DM-2: CBGs QID and SSI (home on glipizide and Farxiga)  A1c 6.5%   5/9- will not restart Glipizide Cr is 2.42- will restart Farxiga 10 mg daily.   -5/10 CBGs a little improved, continue to monitor with restart of farxiga  5/13- CBG's 152-179 in last 24 hours- improved- will NOT restart glipizide  5/14- CBGs 154-181 in last 24 hours- will con't regimen  5/15- CBGs 130-184- con't regimen 19: Chronic diastolic CHF: continue meds as above             -daily weight   5/9- Weight 88.2 kg today  5/10 wt stable,continue to follow  5/11: weight reviewed and has decreased  5/13- Weight up slightly 0.6 kg- will monitor  5/14- weight up to 90.8 kg, however no LE edema and hasn't had BM in 2 days. Will monitor trend  5/16- Weight 87.6 kg- down slightly.  Filed Weights   08/21/22 0500 08/22/22 0430 08/24/22 0243  Weight: 88.7 kg 90.8 kg 87.6 kg    20: ABLA, chronic anemia: follow-up CBC             -  restart PO iron   5/9- Hb up to 10- con't to monitor  5/13- Hb slightly decreased to 9.7 21: Pruritus/rash on back: will order Sarna              22: Tremors: continue primidone   23. Suboptimal vitamin D: start ergocalciferol 50,000U once per week   23. Hypotension: currently hypotensive, will d/c amlodipine  24. Loose stools/cosntipation discussed that these have improved  5/14- Now feels constipated- LBM 2 days ago- will give low dose Sorbitol 15cc at 11am, since doesn't want to have BM on night shift- also, was on Dulcolax tab vs Supp at home daily- will see if needs to add something. Will add Colace 1 tab daily since stool hard and Dulcolax 5 mg daily tablet.   5/15- pt said had "blow out"- after sorbitol- will decrease miralax to 17 G daily as well as stop Colace- since he has Dulcolax tab- pt insistent he only needs Dulcolax, but explained on opiates which constipated pt- he needs more  meds.   5/16- had great BM yesterday AM per pt 25. Indigestion: may overlap with chronic angina, discussed that his outpatient cardiologist prescribed nitroglycerin and this gives him relief, ordered here.   26. Shortness of breath: Albuterol ordered  27. Upper airway congestion: incentive spirometer ordered  28. Desaturation to 82 percent: CXR ordered   5/13- CXR (-) for anything acute 29. Urinary frequency and urgency  5/13- U/A (-) except for high glucose level- has order for timed voiding- will con't 5/16- Purewick at night- pt insistent since was tried. .  30. Anal hemorrhoids  5/13- add Anusol suppository per pt request.   5/15- says is going a little better   I spent a total of  40  minutes on total care today- >50% coordination of care- due to  D/w nursing x2 about pt's night- confused because pt said slept great, then said didn't sleep well- only 3 hours at a time.   LOS: 8 days A FACE TO FACE EVALUATION WAS PERFORMED  Samora Jernberg 08/24/2022, 8:59 AM

## 2022-08-25 ENCOUNTER — Inpatient Hospital Stay (HOSPITAL_COMMUNITY): Payer: No Typology Code available for payment source

## 2022-08-25 DIAGNOSIS — S728X2A Other fracture of left femur, initial encounter for closed fracture: Secondary | ICD-10-CM | POA: Diagnosis not present

## 2022-08-25 LAB — GLUCOSE, CAPILLARY
Glucose-Capillary: 142 mg/dL — ABNORMAL HIGH (ref 70–99)
Glucose-Capillary: 148 mg/dL — ABNORMAL HIGH (ref 70–99)
Glucose-Capillary: 177 mg/dL — ABNORMAL HIGH (ref 70–99)
Glucose-Capillary: 126 mg/dL — ABNORMAL HIGH (ref 70–99)

## 2022-08-25 MED ORDER — GUAIFENESIN 200 MG PO TABS
400.0000 mg | ORAL_TABLET | Freq: Two times a day (BID) | ORAL | Status: DC
  Administered 2022-08-25 – 2022-08-31 (×12): 400 mg via ORAL
  Filled 2022-08-25 (×12): qty 2

## 2022-08-25 NOTE — Progress Notes (Signed)
Occupational Therapy Weekly Progress Note  Patient Details  Name: Marco Acevedo MRN: [id number] Date of Birth: 1935-03-07  Beginning of progress report period: Aug 17, 2022 End of progress report period: Aug 25, 2022  Today's Date: 08/25/2022 OT Individual Time: 1000-1100 OT Individual Time Calculation (min): 60 min    Patient has met 4 of 4 short term goals. Pain continues to limit pt's mobility in L LE which can impact function but pt is extremely motivated to improve and return home as indep as possible to assist his elderly wife. OT has introduced pt to AE for LE selfcare with + results including LH sponge, reacher, sock aide and shoe horn. Has now accessed mirror image stall shower set up in demo apt with CGA with RW support. TED hose assisting with edema and ktape to R sh for pain mngt. Will continue to work with RW with RW bag to progress ADL set up and simple IADL's with RW.   Patient continues to demonstrate the following deficits: muscle weakness and muscle joint tightness, decreased cardiorespiratoy endurance, unbalanced muscle activation, and decreased sitting balance, decreased standing balance, decreased postural control, decreased balance strategies, and difficulty maintaining precautions and therefore will continue to benefit from skilled OT intervention to enhance overall performance with BADL, iADL, and Reduce care partner burden.  Patient progressing toward long term goals..  Continue plan of care.  OT Short Term Goals Week 1:  OT Short Term Goal 1 (Week 1): Pt wll don LB garments with CGA using AE OT Short Term Goal 1 - Progress (Week 1): Met OT Short Term Goal 2 (Week 1): Pt will perform stall shower threshold with AD as needed and CGA OT Short Term Goal 2 - Progress (Week 1): Met OT Short Term Goal 3 (Week 1): Pt will complete grooming standing level with close S OT Short Term Goal 3 - Progress (Week 1): Met Week 2:  OT Short Term Goal 1 (Week 2): STG=LTG d/t  LOS  Skilled Therapeutic Interventions/Progress Updates:    OT session with focus on weekly reassessment and progression of LB self care with AE, pain mngt, and accessing shower stall set up mirrored in demo apt. OT transported pt to and from apt for time mngt. Applied new ktape to right RTC region as pt reported previous application helped greatly with R sh pain. This sesison reported as 4/10 at start of session then 2/10 at session close. OT issued RW bag and trained in basic use. Trial with orthopedic shoes family brought in with sioc aide and LH shoe horn to don with increased time and close S R sock and shoe in figure 4 and min A excluding laces on L LE with Ted hose on. Pt then transferred via RW to and from threshold of stall shower frame with CGA x 2 trials. Pt requested recliner once back in room and left with all needs, nuring button and urinal in place.   Therapy Documentation Precautions:  Precautions Precautions: Fall Precaution Comments: WBAT, no precautions Restrictions Weight Bearing Restrictions: Yes LLE Weight Bearing: Weight bearing as tolerated    Therapy/Group: Individual Therapy  Vicenta Dunning 08/25/2022, 8:04 AM

## 2022-08-25 NOTE — Progress Notes (Signed)
Physical Therapy Session Note  Patient Details  Name: Marco Acevedo MRN: [id number] Date of Birth: Jul 07, 1934  Today's Date: 08/25/2022 PT Individual Time: 0905-1005 PT Individual Time Calculation (min): 60 min   Short Term Goals: Week 2:  PT Short Term Goal 1 (Week 2): STG=LTG due to ELOS  Skilled Therapeutic Interventions/Progress Updates: Pt presented in recliner agreeable to therapy. Pt states pain well controlled at rest 3/10, but groin pain increases with transition movements. Pt initially verbose and requires increased time to initiate task but throughout session able to more easily perform tasks. Pt performed Sit to stand with light CGA and ambulated to door of room ~67ft with CGA and RW. Pt noted to have decreased L knee flexion and antalgic gait. Pt then sat in w/c and propelled to day room with supervision. Pt was able to perform LAQ within available range with PTA providing active assist to complete range. Pt then performed standing SLR x 10 with no significant increase in pain. Once completed pt indicated urgency for urination. Pt ambulated to day room bathroom ~30 ft with CGA and was able to complete continent urinary void in standing. Pt then ambulated to sink and completed hand hygiene in standing with close supervision and pt able to complete reaching tasks (soap and paper towels) with supervision. Pt then ambulated to w/ ~3ft with CGA. Returned to w/c and propelled back to room in same manner as prior. Pt left in w/c at end of session due to OT session immediately following this session. Pt left with call bell within reach and current needs met.      Therapy Documentation Precautions:  Precautions Precautions: Fall Precaution Comments: WBAT, no precautions Restrictions Weight Bearing Restrictions: Yes LLE Weight Bearing: Weight bearing as tolerated General:   Vital Signs: Therapy Vitals Temp: 98.2 F (36.8 C) Temp Source: Oral Pulse Rate: 70 Resp: 15 BP:  93/68 Patient Position (if appropriate): Sitting Oxygen Therapy SpO2: (!) 88 % O2 Device: Room Air   Therapy/Group: Individual Therapy  Avantika Shere 08/25/2022, 5:15 PM

## 2022-08-25 NOTE — Progress Notes (Signed)
Occupational Therapy Session Note  Patient Details  Name: Marco Acevedo MRN: [id number] Date of Birth: [birth year]  Today's Date: 08/25/2022 OT Individual Time: 1330-1415 OT Individual Time Calculation (min): 45 min    Short Term Goals: Week 2:  OT Short Term Goal 1 (Week 2): STG=LTG d/t LOS  Skilled Therapeutic Interventions/Progress Updates:    Pt greeted seated in recliner and agreeable to OT treatment session. Pt very talkative and needed cues to stay on task. Pt ambulated to wc with RW and CGA, min A to stand. Addressed standing balance/endurance standing on foam block, then incorporated grip strengthening with graded clothes pins placed around waist and reaching outside base of support to place on basketball net-simulated clothing management task. CGA/min A for dynamic balance. Clothes pin basket placed on L side to encourage weight shift onto LLE. Pt returned to room and ambulated back to recliner with RW and CGA. Pt left seated in recliner with alarm belt on, call bell in reach and needs met.  Therapy Documentation Precautions:  Precautions Precautions: Fall Precaution Comments: WBAT, no precautions Restrictions Weight Bearing Restrictions: Yes LLE Weight Bearing: Weight bearing as tolerated Pain: 7/10 pain in L hip, rest and repositioned  Therapy/Group: Individual Therapy  Mal Amabile 08/25/2022, 2:03 PM

## 2022-08-25 NOTE — Progress Notes (Signed)
Physical Therapy Session Note  Patient Details  Name: Marco Acevedo MRN: [id number] Date of Birth: 1934/10/18  Today's Date: 08/25/2022 PT Individual Time: 1127-1213 PT Individual Time Calculation (min): 46 min   Short Term Goals: Week 2:  PT Short Term Goal 1 (Week 2): STG=LTG due to ELOS  Skilled Therapeutic Interventions/Progress Updates:    Pt presents in room in bed, agreeable to earlier PT session with education on completing therapeutic  exercise to address mobility deficits. Session focuses on transfer training, self care tasks with emphasis on managing mobility and standing balance for ADL, and therex for LLE strengthening and mobility to decrease pain and achieve functional goals. Pt completes sit<>stands throughout session to RW with CGA, increased time to complete, verbalizes safety techniques. Pt completes stand pivot transfer with CGA RW.  Pt reports that excessive ambulation decreases his ability to participate with exercise with pt educated on energy conservation and manageable tasks as well as therex to improve tolerance to mobility with pt agreeable. Pt ambulates short distance ~5' from recliner<>WC at beginning of session with CGA, demonstrates decreased weightbearing tolerance for LLE with some knee buckle noted with initial gait however L knee stability improves following therex.  Pt transitions to [facility] and is transported dependently to main gym for time management, pt then requesting to use restroom. Pt transported to bathroom where pt positioned in [facility] in front of toilet with RW positioned over toilet for BUE support, pt stands to RW with CGA and manages pants and continent void with supervision and unilateral UE support on LUE. Pt completes hand hygiene from sitting position for time management and then transported to main gym.  Pt completes stand pivot transfer with RW to mat, requires max assist for sit to supine for BLE management onto mat.  Pt then completes supine therex  to promote LLE open chain strengthening as well as mobility including: Hooklying marching x10 LLE (minimal active assist for initiation of movement) Hip abduction x10 LLE (within tolerable range) Hip flexion from neutral position x10 LLE (mod active assist) Bent knee fall out x10 LLE SLR LLE x10 (mod active assist) *provided with rest breaks between exercise for tolerance and energy conservation  Pt completes supine to sit with max assist for BLEs off bed and trunk to upright. Pt endorses dizziness upon initial sit with education for remaining sitting until symptoms pass to allow body to acclimate to upright.  Pt transfers with RW back to [facility] and transported back to room dependently. Pt ambulates 5' with RW to recliner and remains with all needs within reach, call light in place, and posey belt donned and activated with RN present at end of session.  Therapy Documentation Precautions:  Precautions Precautions: Fall Precaution Comments: WBAT, no precautions Restrictions Weight Bearing Restrictions: Yes LLE Weight Bearing: Weight bearing as tolerated   Therapy/Group: Individual Therapy  Edwin Cap PT, DPT 08/25/2022, 12:26 PM

## 2022-08-25 NOTE — Progress Notes (Signed)
PROGRESS NOTE   Subjective/Complaints:   Pt reports lidoderm patches helpful on sacral pain.  LBM yesterday.   No pain at all this Am in usual spots- only in L groin when moves- hasn't been taking pain meds often- educated to take before Am and afternoon therapy.   Swelling feels like it's improved in L hip and LLE per pt.   ROS:  Pt denies SOB, abd pain, CP, N/V/C/D, and vision changes   Except for HPI  Objective:   DG Pelvis 1-2 Views  Result Date: 08/23/2022 CLINICAL DATA:  [id number] S/p left hip fracture [id number] EXAM: PELVIS - 1-2 VIEW COMPARISON:  CT 08/14/2022 FINDINGS: Postsurgical changes of flexor proximal femur ORIF for an intertrochanteric fracture. Hardware is intact without evidence of loosening. The far distal tip is excluded by collimation. Expected soft tissue changes. Vascular calcifications. No other acute fracture identified. Mild degenerative changes of the hips. IMPRESSION: Postsurgical changes of left proximal femur ORIF. No evidence of hardware complication. Electronically Signed   By: Caprice Renshaw M.D.   On: 08/23/2022 11:01   No results for input(s): "WBC", "HGB", "HCT", "PLT" in the last 72 hours.   Recent Labs    08/23/22 0806  NA 134*  K 4.6  CL 100  CO2 26  GLUCOSE 206*  BUN 52*  CREATININE 2.54*  CALCIUM 9.1     Intake/Output Summary (Last 24 hours) at 08/25/2022 0833 Last data filed at 08/25/2022 0811 Gross per 24 hour  Intake 354 ml  Output 1400 ml  Net -1046 ml        Physical Exam: Vital Signs Blood pressure (!) 114/54, pulse 66, temperature 98.4 F (36.9 C), temperature source Oral, resp. rate 18, height 5\' 8"  (1.727 m), weight 86.2 kg, SpO2 (!) 84 %.        General: awake, alert, irritable- insisting to get up on his own; NAD HENT: conjugate gaze; oropharynx moist CV: regular rate and rhythm; no JVD Pulmonary: CTA B/L; no W/R/R- good air movement GI: soft, NT, ND,  protuberant; hypoactive BS Psychiatric: irritable- trying to get up on his own Neurological: Ox3  MS: Has a large bump of tissue over L hip incision- like a fluid bump- 1 inch protuberance for ~ 4-5 inches downwards- looks about the same Skin: L hip dressing in place- different dressing- C/D/I Extremities: no LE edema seen Skin: Warm and dry, dressing on L lateral hip MS: LLE strength 2-/5 in L HF, KE, DF and PF at 4-4+/5- limited by pain  Assessment/Plan: 1. Functional deficits which require 3+ hours per day of interdisciplinary therapy in a comprehensive inpatient rehab setting. Physiatrist is providing close team supervision and 24 hour management of active medical problems listed below. Physiatrist and rehab team continue to assess barriers to discharge/monitor patient progress toward functional and medical goals  Care Tool:  Bathing    Body parts bathed by patient: Right arm, Chest, Left arm, Abdomen, Front perineal area, Left upper leg, Right upper leg, Face   Body parts bathed by helper: Right lower leg, Left lower leg     Bathing assist Assist Level: Total Assistance - Patient < 25%     Upper Body  Dressing/Undressing Upper body dressing   What is the patient wearing?: Pull over shirt    Upper body assist Assist Level: Set up assist    Lower Body Dressing/Undressing Lower body dressing      What is the patient wearing?: Underwear/pull up, Pants     Lower body assist Assist for lower body dressing: Dependent - Patient 0%     Toileting Toileting    Toileting assist Assist for toileting: Contact Guard/Touching assist     Transfers Chair/bed transfer  Transfers assist     Chair/bed transfer assist level: Minimal Assistance - Patient > 75%     Locomotion Ambulation   Ambulation assist      Assist level: Contact Guard/Touching assist Assistive device: Walker-rolling Max distance: 25   Walk 10 feet activity   Assist     Assist level: Contact  Guard/Touching assist Assistive device: Walker-rolling   Walk 50 feet activity   Assist Walk 50 feet with 2 turns activity did not occur: Safety/medical concerns (pain, weakness)  Assist level: Contact Guard/Touching assist Assistive device: Walker-rolling    Walk 150 feet activity   Assist Walk 150 feet activity did not occur: Safety/medical concerns         Walk 10 feet on uneven surface  activity   Assist     Assist level: Contact Guard/Touching assist     Wheelchair     Assist Is the patient using a wheelchair?: Yes Type of Wheelchair: Manual    Wheelchair assist level: Supervision/Verbal cueing Max wheelchair distance: 150 ft    Wheelchair 50 feet with 2 turns activity    Assist        Assist Level: Supervision/Verbal cueing   Wheelchair 150 feet activity     Assist      Assist Level: Supervision/Verbal cueing   Blood pressure (!) 114/54, pulse 66, temperature 98.4 F (36.9 C), temperature source Oral, resp. rate 18, height 5\' 8"  (1.727 m), weight 86.2 kg, SpO2 (!) 84 %.  Medical Problem List and Plan: 1. Functional deficits secondary to left intertrochanteric femur fracture S/P IM nail and ORIF- is WBAT             -patient may shower             -ELOS/Goals: 7 days modI             d/c 5/23  L hip xray just normal swelling- hardware looks OK  Advised pt to not get OOB without staff  Con't CIR PT and OT- advised to take pain meds before therapy- not to wait til having pain.  2.  Antithrombotics: -DVT/anticoagulation:  Pharmaceutical: Lovenox 30 mg X 4 weeks             -antiplatelet therapy: aspirin 81 mg daily   3. Pain Management: Tylenol as needed             -continue gabapentin 300 mg BID             -continue Lidoderm daily   5/9- switched gabapentin to 600 mg QHS since pt only took 300 mg QHS at home- also on Oxy 5-10 mg q4 hours prn- asked pt to take prior to AM therapy.   5/10-reports pain controlled overall, continue  current regimen  5/16- having more sacral pain- L lateral- will have pt try Lidoderm patches he already has ordered, but refused because thought were Salonpas 5/17- Lidoderm patches were helpful- just having L groin pain- xray shows intact hardware- swelling  improving Encouraged pt to take pain meds in Am and lunchtime for L groin pain. Explained it's from L hip 4. Mood/Behavior/Sleep: LCSW to evaluate and provide emotional support             -antipsychotic agents: n/a   5. Neuropsych/cognition: This patient is capable of making decisions on his own behalf.   6. Skin/Wound Care: Routine skin care checks   5/9- Dressing in place- con't dressings 7. Fluids/Electrolytes/Nutrition: Routine Is and Os and follow-up chemistries             -carb modified diet   8: Hypertension: monitor TID and prn             -continue Norvasc 5  mg daily             -continue Coreg 3.125 mg BID             -continue Imdur 120 mg daily   5/9- BP labile- 110s-180s in last 24 hours- will monitor for trend  -5/10 BP controlled overall, continue current regimen however will schedule Coreg earlier in the morning per patient's request  5/16- 5/17- BP controlled- con't regimen    08/25/2022    5:31 AM 08/24/2022    8:39 PM 08/24/2022    5:14 PM  Vitals with BMI  Weight 190 lbs 1 oz    BMI 28.9    Systolic 114 134 914  Diastolic 54 66 69  Pulse 66 68 60    9: Hyperlipidemia: continue statin     10: BPH/urinary frequency/retention: continue Proscar   11: History of gout: continue allopurinol   12: Left hip fracture s/p IM nail 5/03 Dr. Blanchie Dessert             -WBAT LLE             -change dressing as needed for soiling/if wet   5.13- looks good- asked nursing to change, since folding up constantly.  13: CAD: s/p stenting X 2; stable. On statin, aspirin and BB. Restart Plavix   14: CKD stage IIIb>>IV: baseline Cr ~2.5 (home on Farxiga)             -follow-up BMP   5/9- Will restart Farxiga-   5/13- Cr  up slightly to 2.71 and BUN 57- will recheck Wednesday and if not better, will need to give some fluids. 5/15- Cr down to 2.54 from 2.71 and BUN slightly better at 52 from 57- will con't to have pt push fluids and recheck next Monday.    15: COPD: stable             -continue albuterol inhaler prn phlegm   16: GERD/HH: continue Protonix and Carafate   17: OSA: not using CPAP (he is not clear regarding results of sleep study)   18: DM-2: CBGs QID and SSI (home on glipizide and Farxiga)  A1c 6.5%   5/9- will not restart Glipizide Cr is 2.42- will restart Farxiga 10 mg daily.   -5/10 CBGs a little improved, continue to monitor with restart of farxiga  5/13- CBG's 152-179 in last 24 hours- improved- will NOT restart glipizide  5/14- CBGs 154-181 in last 24 hours- will con't regimen  5/15- CBGs 130-184- con't regimen 19: Chronic diastolic CHF: continue meds as above             -daily weight   5/9- Weight 88.2 kg today  5/10 wt stable,continue to follow  5/11: weight reviewed and has decreased  5/13-  Weight up slightly 0.6 kg- will monitor  5/14- weight up to 90.8 kg, however no LE edema and hasn't had BM in 2 days. Will monitor trend  5/16- Weight 87.6 kg- down slightly.   5/17- Weight 86.2 kg Filed Weights   08/22/22 0430 08/24/22 0243 08/25/22 0531  Weight: 90.8 kg 87.6 kg 86.2 kg    20: ABLA, chronic anemia: follow-up CBC             -restart PO iron   5/9- Hb up to 10- con't to monitor  5/13- Hb slightly decreased to 9.7 21: Pruritus/rash on back: will order Sarna              22: Tremors: continue primidone   23. Suboptimal vitamin D: start ergocalciferol 50,000U once per week   23. Hypotension: currently hypotensive, will d/c amlodipine  24. Loose stools/cosntipation discussed that these have improved  5/14- Now feels constipated- LBM 2 days ago- will give low dose Sorbitol 15cc at 11am, since doesn't want to have BM on night shift- also, was on Dulcolax tab vs Supp at  home daily- will see if needs to add something. Will add Colace 1 tab daily since stool hard and Dulcolax 5 mg daily tablet.   5/15- pt said had "blow out"- after sorbitol- will decrease miralax to 17 G daily as well as stop Colace- since he has Dulcolax tab- pt insistent he only needs Dulcolax, but explained on opiates which constipated pt- he needs more meds.   5/16- had great BM yesterday AM per pt  5/17- LBM yesterday 25. Indigestion: may overlap with chronic angina, discussed that his outpatient cardiologist prescribed nitroglycerin and this gives him relief, ordered here.   26. Shortness of breath: Albuterol ordered  27. Upper airway congestion: incentive spirometer ordered  28. Desaturation to 82 percent: CXR ordered   5/13- CXR (-) for anything acute 29. Urinary frequency and urgency  5/13- U/A (-) except for high glucose level- has order for timed voiding- will con't 5/16- Purewick at night- pt insistent since was tried. . 5/17- pt insistent that nursing needs to come immediately when has urge to void-  upset that nursing not here during report this AM- wants [place] to leave RW where he can get up on his own- explained we cannot do that due to fall risk.  30. Anal hemorrhoids  5/13- add Anusol suppository per pt request.   5/15- says is going a little better    I spent a total of  38  minutes on total care today- >50% coordination of care- due to  D/w pt multiple times about not getting up on his own to pee without assistance- which he says he's going to do; If nursing doesn't come immediately.  Also d/w pt about taking pain meds before therapy due to L groin pain.   LOS: 9 days A FACE TO FACE EVALUATION WAS PERFORMED  Belmira Daley 08/25/2022, 8:33 AM

## 2022-08-26 DIAGNOSIS — S728X2A Other fracture of left femur, initial encounter for closed fracture: Secondary | ICD-10-CM | POA: Diagnosis not present

## 2022-08-26 LAB — GLUCOSE, CAPILLARY
Glucose-Capillary: 133 mg/dL — ABNORMAL HIGH (ref 70–99)
Glucose-Capillary: 186 mg/dL — ABNORMAL HIGH (ref 70–99)
Glucose-Capillary: 146 mg/dL — ABNORMAL HIGH (ref 70–99)
Glucose-Capillary: 156 mg/dL — ABNORMAL HIGH (ref 70–99)

## 2022-08-26 MED ORDER — ALBUTEROL SULFATE (2.5 MG/3ML) 0.083% IN NEBU: 2.5000 mg | INHALATION_SOLUTION | RESPIRATORY_TRACT | Status: DC | PRN

## 2022-08-26 MED ORDER — ENOXAPARIN SODIUM 100 MG/ML IJ SOSY
90.0000 mg | PREFILLED_SYRINGE | Freq: Two times a day (BID) | INTRAMUSCULAR | Status: DC
  Administered 2022-08-26 – 2022-08-28 (×4): 90 mg via SUBCUTANEOUS
  Filled 2022-08-26 (×4): qty 1

## 2022-08-26 MED ORDER — ALBUTEROL SULFATE (2.5 MG/3ML) 0.083% IN NEBU
2.5000 mg | INHALATION_SOLUTION | Freq: Two times a day (BID) | RESPIRATORY_TRACT | Status: DC
  Administered 2022-08-26 – 2022-08-31 (×10): 2.5 mg via RESPIRATORY_TRACT
  Filled 2022-08-26 (×10): qty 3

## 2022-08-26 MED ORDER — ALBUTEROL SULFATE (2.5 MG/3ML) 0.083% IN NEBU
2.5000 mg | INHALATION_SOLUTION | Freq: Four times a day (QID) | RESPIRATORY_TRACT | Status: DC
  Administered 2022-08-26: 2.5 mg via RESPIRATORY_TRACT
  Filled 2022-08-26: qty 3

## 2022-08-26 NOTE — Progress Notes (Signed)
Per therapy patient saturation dropped to low 80's with SOB on exertion. Vital signs rechecked. MD notified of results.

## 2022-08-26 NOTE — Progress Notes (Signed)
Occupational Therapy Session Note  Patient Details  Name: Marco Acevedo MRN: [id number] Date of Birth: Feb 03, 1935  Today's Date: 08/26/2022 OT Individual Time: 1015-1100 OT Individual Time Calculation (min): 45 min    Short Term Goals: Week 2:  OT Short Term Goal 1 (Week 2): STG=LTG d/t LOS  Skilled Therapeutic Interventions/Progress Updates:   OT chart review appreciating pt placed on O2 last night due to mucus and coughing issues. Pt now on RA in recliner with O2 sat 90%. Acapella flutter device on dresser in bag. OT educated on use and provided with booklet for pt education. Care coord with later OT clinician for follow thru due to time constraints this session. OT also addressed integration of breathing during am self care sink side for oral care, shaving and hair care with intermittent standing for improving tolerance and balance. Light item transport within room in RW bag with close S. O2 sats 92% after session with pt hand off to NT to set up in recliner due to time constraints.   Pain:  2/10 groin on L LE with TED applied and repositioning     Therapy Documentation Precautions:  Precautions Precautions: Fall Precaution Comments: WBAT, no precautions Restrictions Weight Bearing Restrictions: Yes LLE Weight Bearing: Weight bearing as tolerated     Therapy/Group: Individual Therapy  Vicenta Dunning 08/26/2022, 7:45 AM

## 2022-08-26 NOTE — Progress Notes (Signed)
PROGRESS NOTE   Subjective/Complaints:   Pt had SOB and drop in O2 sats last night- into 70s- fingers cold, however felt SOB- 3L O2 was placed- O2 sats came up to 90s- feels fine this AM However with PT, O2 sats dropped again into 80s and SOB/DOE- CXR done last night- (-)- so will check CT with contrast of chest.   Pt excited because L leg moving much better today.  Getting mucus broken up in chest, per pt with guafensin- said had at home, but hasn't had in 3 weeks.  Gets pain meds scheduled per pt.  Doesn't want Lasix LBM yesterday  "In bed".    ROS:   Pt denies: (+)  SOB, abd pain, CP, N/V/C/D, and vision changes   Except for HPI  Objective:   DG CHEST PORT 1 VIEW  Result Date: 08/25/2022 CLINICAL DATA:  Cough EXAM: PORTABLE CHEST 1 VIEW COMPARISON:  08/20/2022 FINDINGS: Lungs are well expanded, symmetric, and clear. No pneumothorax or pleural effusion. Cardiac size within normal limits. Pulmonary vascularity is normal. Osseous structures are age-appropriate. No acute bone abnormality. IMPRESSION: No active disease. Electronically Signed   By: Helyn Numbers M.D.   On: 08/25/2022 23:34   No results for input(s): "WBC", "HGB", "HCT", "PLT" in the last 72 hours.   No results for input(s): "NA", "K", "CL", "CO2", "GLUCOSE", "BUN", "CREATININE", "CALCIUM" in the last 72 hours.    Intake/Output Summary (Last 24 hours) at 08/26/2022 1428 Last data filed at 08/26/2022 1120 Gross per 24 hour  Intake 237 ml  Output 2100 ml  Net -1863 ml        Physical Exam: Vital Signs Blood pressure (!) 106/42, pulse 68, temperature 97.8 F (36.6 C), temperature source Oral, resp. rate 14, height 5\' 8"  (1.727 m), weight 87.6 kg, SpO2 96 %.        General: awake, alert, appropriate, sitting up in bedside chair;  NAD HENT: conjugate gaze; oropharynx moist CV: regular rate and rhythm; no JVD Pulmonary: sounds a little wheezy  and slightly coarse but no rhonchi, rales GI: soft, NT, ND, (+)BS Psychiatric: appropriate- very talkative Neurological: Ox3  MS: Has a large bump of tissue over L hip incision- like a fluid bump- 1 inch protuberance for ~ 2-2.5 inches downwards- less swollen Skin: L hip dressing in place- different dressing- C/D/I Extremities: no LE edema seen Skin: Warm and dry, dressing on L lateral hip MS: LLE strength 2-/5 in L HF, KE, DF and PF at 4-4+/5- limited by pain  Assessment/Plan: 1. Functional deficits which require 3+ hours per day of interdisciplinary therapy in a comprehensive inpatient rehab setting. Physiatrist is providing close team supervision and 24 hour management of active medical problems listed below. Physiatrist and rehab team continue to assess barriers to discharge/monitor patient progress toward functional and medical goals  Care Tool:  Bathing    Body parts bathed by patient: Right arm, Chest, Left arm, Abdomen, Front perineal area, Left upper leg, Right upper leg, Face, Left lower leg, Right lower leg   Body parts bathed by helper: Buttocks     Bathing assist Assist Level: Minimal Assistance - Patient > 75%  Upper Body Dressing/Undressing Upper body dressing   What is the patient wearing?: Pull over shirt    Upper body assist Assist Level: Independent with assistive device    Lower Body Dressing/Undressing Lower body dressing      What is the patient wearing?: Underwear/pull up, Pants     Lower body assist Assist for lower body dressing: Minimal Assistance - Patient > 75%     Toileting Toileting    Toileting assist Assist for toileting: Contact Guard/Touching assist     Transfers Chair/bed transfer  Transfers assist     Chair/bed transfer assist level: Minimal Assistance - Patient > 75%     Locomotion Ambulation   Ambulation assist      Assist level: Contact Guard/Touching assist Assistive device: Walker-rolling Max distance: 25    Walk 10 feet activity   Assist     Assist level: Contact Guard/Touching assist Assistive device: Walker-rolling   Walk 50 feet activity   Assist Walk 50 feet with 2 turns activity did not occur: Safety/medical concerns (pain, weakness)  Assist level: Contact Guard/Touching assist Assistive device: Walker-rolling    Walk 150 feet activity   Assist Walk 150 feet activity did not occur: Safety/medical concerns         Walk 10 feet on uneven surface  activity   Assist     Assist level: Contact Guard/Touching assist     Wheelchair     Assist Is the patient using a wheelchair?: Yes Type of Wheelchair: Manual    Wheelchair assist level: Supervision/Verbal cueing Max wheelchair distance: 150 ft    Wheelchair 50 feet with 2 turns activity    Assist        Assist Level: Supervision/Verbal cueing   Wheelchair 150 feet activity     Assist      Assist Level: Supervision/Verbal cueing   Blood pressure (!) 106/42, pulse 68, temperature 97.8 F (36.6 C), temperature source Oral, resp. rate 14, height 5\' 8"  (1.727 m), weight 87.6 kg, SpO2 96 %.  Medical Problem List and Plan: 1. Functional deficits secondary to left intertrochanteric femur fracture S/P IM nail and ORIF- is WBAT             -patient may shower             -ELOS/Goals: 7 days modI             d/c 5/23  L hip xray just normal swelling- hardware looks OK  Advised pt to not get OOB without staff  Con't CIR PT and OT Will get chest CT with contrast- swelling better and moving LLE better! 2.  Antithrombotics: -DVT/anticoagulation:  Pharmaceutical: Lovenox 30 mg X 4 weeks             -antiplatelet therapy: aspirin 81 mg daily   3. Pain Management: Tylenol as needed             -continue gabapentin 300 mg BID             -continue Lidoderm daily   5/9- switched gabapentin to 600 mg QHS since pt only took 300 mg QHS at home- also on Oxy 5-10 mg q4 hours prn- asked pt to take prior  to AM therapy.   5/10-reports pain controlled overall, continue current regimen  5/16- having more sacral pain- L lateral- will have pt try Lidoderm patches he already has ordered, but refused because thought were Salonpas 5/17- Lidoderm patches were helpful- just having L groin pain- xray shows intact  hardware- swelling improving Encouraged pt to take pain meds in Am and lunchtime for L groin pain. Explained it's from L hip 5/18- having improved ROM/movement in L hip/LLE- con't regimen 4. Mood/Behavior/Sleep: LCSW to evaluate and provide emotional support             -antipsychotic agents: n/a   5. Neuropsych/cognition: This patient is capable of making decisions on his own behalf.   6. Skin/Wound Care: Routine skin care checks   5/9- Dressing in place- con't dressings 7. Fluids/Electrolytes/Nutrition: Routine Is and Os and follow-up chemistries             -carb modified diet   8: Hypertension: monitor TID and prn             -continue Norvasc 5  mg daily             -continue Coreg 3.125 mg BID             -continue Imdur 120 mg daily   5/9- BP labile- 110s-180s in last 24 hours- will monitor for trend  -5/10 BP controlled overall, continue current regimen however will schedule Coreg earlier in the morning per patient's request  5/18- still looks good- - BP controlled- con't regimen    08/26/2022    2:05 PM 08/26/2022    3:02 AM 08/26/2022    2:57 AM  Vitals with BMI  Weight  193 lbs 2 oz   BMI  29.37   Systolic 106  133  Diastolic 42  62  Pulse 68  73    9: Hyperlipidemia: continue statin     10: BPH/urinary frequency/retention: continue Proscar   11: History of gout: continue allopurinol   12: Left hip fracture s/p IM nail 5/03 Dr. Blanchie Dessert             -WBAT LLE             -change dressing as needed for soiling/if wet   5.13- looks good- asked nursing to change, since folding up constantly.  13: CAD: s/p stenting X 2; stable. On statin, aspirin and BB. Restart  Plavix   14: CKD stage IIIb>>IV: baseline Cr ~2.5 (home on Farxiga)             -follow-up BMP   5/9- Will restart Farxiga-   5/13- Cr up slightly to 2.71 and BUN 57- will recheck Wednesday and if not better, will need to give some fluids. 5/15- Cr down to 2.54 from 2.71 and BUN slightly better at 52 from 57- will con't to have pt push fluids and recheck next Monday.    15: COPD: stable             -continue albuterol inhaler prn phlegm   16: GERD/HH: continue Protonix and Carafate   17: OSA: not using CPAP (he is not clear regarding results of sleep study)   18: DM-2: CBGs QID and SSI (home on glipizide and Farxiga)  A1c 6.5%   5/9- will not restart Glipizide Cr is 2.42- will restart Farxiga 10 mg daily.   -5/10 CBGs a little improved, continue to monitor with restart of farxiga  5/13- CBG's 152-179 in last 24 hours- improved- will NOT restart glipizide  5/14- CBGs 154-181 in last 24 hours- will con't regimen  5/15- CBGs 130-184- con't regimen  5/18- CBGs stable- con't regimen 19: Chronic diastolic CHF: continue meds as above             -daily weight  5/9- Weight 88.2 kg today  5/10 wt stable,continue to follow  5/11: weight reviewed and has decreased  5/13- Weight up slightly 0.6 kg- will monitor  5/14- weight up to 90.8 kg, however no LE edema and hasn't had BM in 2 days. Will monitor trend  5/16- Weight 87.6 kg- down slightly.   5/17- Weight 86.2 kg  5/18- weight 87.6 kg- if goes up again, will give Lasix  Filed Weights   08/24/22 0243 08/25/22 0531 08/26/22 0302  Weight: 87.6 kg 86.2 kg 87.6 kg    20: ABLA, chronic anemia: follow-up CBC             -restart PO iron   5/9- Hb up to 10- con't to monitor  5/13- Hb slightly decreased to 9.7 21: Pruritus/rash on back: will order Sarna              22: Tremors: continue primidone   23. Suboptimal vitamin D: start ergocalciferol 50,000U once per week   23. Hypotension: currently hypotensive, will d/c amlodipine  24.  Loose stools/cosntipation discussed that these have improved  5/14- Now feels constipated- LBM 2 days ago- will give low dose Sorbitol 15cc at 11am, since doesn't want to have BM on night shift- also, was on Dulcolax tab vs Supp at home daily- will see if needs to add something. Will add Colace 1 tab daily since stool hard and Dulcolax 5 mg daily tablet.   5/15- pt said had "blow out"- after sorbitol- will decrease miralax to 17 G daily as well as stop Colace- since he has Dulcolax tab- pt insistent he only needs Dulcolax, but explained on opiates which constipated pt- he needs more meds.   5/16- had great BM yesterday AM per pt  5/17- LBM yesterday 25. Indigestion: may overlap with chronic angina, discussed that his outpatient cardiologist prescribed nitroglycerin and this gives him relief, ordered here. 5/18- takes NTG for GERD   26. Shortness of breath: Albuterol ordered  5/18- added albuterol nebs scheduled for 1 day  27. Upper airway congestion: incentive spirometer ordered  28. Desaturation to 82 percent: CXR ordered   5/13- CXR (-) for anything acute  5/18- per #31 29. Urinary frequency and urgency  5/13- U/A (-) except for high glucose level- has order for timed voiding- will con't 5/16- Purewick at night- pt insistent since was tried. . 5/17- pt insistent that nursing needs to come immediately when has urge to void-  upset that nursing not here during report this AM- wants [place] to leave RW where he can get up on his own- explained we cannot do that due to fall risk.  30. Anal hemorrhoids  5/13- add Anusol suppository per pt request.   5/15- says is going a little better 31. Hypoxia/Drop in O2 sats  5/18- drop in O2 sats overnight- will schedule Albuterol nebs for 24 hours- wait on giving Lasix since -pt doesn't want AND CXR (-) for vascular congestion; will restart Guafenisin BID as well and check CT chest with contrast- is pending   I spent a total of 59   minutes on total care  today- >50% coordination of care- due to  D/w therapy - and PA- about pt's O2 sats which dropped down to 70-80s- off O2- ok on O2- also d/w charge nurse and pt's nurse about pt's O2 sats- decided since CXR was (-), even though fingers cold, since CXR (-), will get CT chest with contrast to look for PE- HR in 60s-70s, but just  to be thorough.    LOS: 10 days A FACE TO FACE EVALUATION WAS PERFORMED  Daysie Helf 08/26/2022, 2:28 PM

## 2022-08-26 NOTE — Progress Notes (Addendum)
Patient oxygen sating 77-79. Nurse sat patient up and offered inhaler and called respiratory. Respiratory nurse put oxygen at 3L oxygen to keep patient at or above 90 and to use flutter valve as needed to help cough up mucus. Nurse contacted [place] PA. PA order chest xray. Patient is resting well with oxygen at 90%.

## 2022-08-26 NOTE — Progress Notes (Signed)
Patient 02 sat was 87% in RA after walking to the bathroom ; 02 sat went up to 93-95%  at 2 L/min. Patient c/o SOB.Continued to monitor.

## 2022-08-26 NOTE — Progress Notes (Signed)
Occupational Therapy Session Note  Patient Details  Name: Marco Acevedo MRN: [id number] Date of Birth: [birth year]  Today's Date: 08/26/2022 OT Individual Time: 1240-1345 OT Individual Time Calculation (min): 65 min    Short Term Goals: Week 2:  OT Short Term Goal 1 (Week 2): STG=LTG d/t LOS  Skilled Therapeutic Interventions/Progress Updates:  Pt received sitting in recliner with RT present, skilled OT session with focus on general conditioning and endurance training. Pt agreeable to interventions, demonstrating overall pleasant mood. Pt reported 3/10 pain in R-hip. OT offering intermediate rest breaks and positioning suggestions throughout session to address pain/fatigue and maximize participation/safety in session.  Pt dependently transported to main therapy gym for time management. In therapy gym, pt participates in series of R-hip strengthening exercises at parallel bars, including:  -Flexion/Extension -Abduction/Adduction -Internal/External Rotation  Pt performs 1-2 sets/10 reps of each exercise with multimodal cuing for correct form. Pt's O2 stats monitored throughout, requiring extended rest breaks to recover from desat into low 80s. Pt placed on 1L of O2 towards end of session to maintain readings at or above 90%.   Pt with urgency at gym, provided with urinal, continent of bladder void.   Pt performs multiple STS transfers during session, all with close supervision + RW.   Pt remained resting in recliner with all immediate needs met at end of session. Pt continues to be appropriate for skilled OT intervention to promote further functional independence.   Therapy Documentation Precautions:  Precautions Precautions: Fall Precaution Comments: WBAT, no precautions Restrictions Weight Bearing Restrictions: Yes LLE Weight Bearing: Weight bearing as tolerated   Therapy/Group: Individual Therapy  Lou Cal, OTR/L, MSOT  08/26/2022, 5:43 AM

## 2022-08-27 DIAGNOSIS — S728X2A Other fracture of left femur, initial encounter for closed fracture: Secondary | ICD-10-CM | POA: Diagnosis not present

## 2022-08-27 LAB — GLUCOSE, CAPILLARY
Glucose-Capillary: 124 mg/dL — ABNORMAL HIGH (ref 70–99)
Glucose-Capillary: 237 mg/dL — ABNORMAL HIGH (ref 70–99)
Glucose-Capillary: 138 mg/dL — ABNORMAL HIGH (ref 70–99)
Glucose-Capillary: 131 mg/dL — ABNORMAL HIGH (ref 70–99)

## 2022-08-27 NOTE — Progress Notes (Signed)
Occupational Therapy Session Note  Patient Details  Name: Marco Acevedo MRN: [id number] Date of Birth: 1934-07-24  {CHL IP REHAB OT TIME CALCULATIONS:[id number]}   Short Term Goals: Week 2:  OT Short Term Goal 1 (Week 2): STG=LTG d/t LOS  Skilled Therapeutic Interventions/Progress Updates:  Pt received *** for skilled OT session with focus on ***. Pt agreeable to interventions, demonstrating overall *** mood. Pt reported ***/10 pain, stating "***" in reference to ***. OT offering intermediate rest breaks and positioning suggestions throughout session to address pain/fatigue and maximize participation/safety in session.    Pt remained *** with all immediate needs met at end of session. Pt continues to be appropriate for skilled OT intervention to promote further functional independence.   Therapy Documentation Precautions:  Precautions Precautions: Fall Precaution Comments: WBAT, no precautions Restrictions Weight Bearing Restrictions: No LLE Weight Bearing: Weight bearing as tolerated   Therapy/Group: Individual Therapy  Lou Cal, OTR/L, MSOT  08/27/2022, 10:57 PM

## 2022-08-27 NOTE — Progress Notes (Signed)
Physical Therapy Session Note  Patient Details  Name: Marco Acevedo MRN: [id number] Date of Birth: 1934-08-24  Today's Date: 08/27/2022 PT Individual Time: 1300-1345 PT Individual Time Calculation (min): 45 min   Short Term Goals: Week 2:  PT Short Term Goal 1 (Week 2): STG=LTG due to ELOS  Skilled Therapeutic Interventions/Progress Updates:      Therapy Documentation Precautions:  Precautions Precautions: Fall Precaution Comments: WBAT, no precautions Restrictions Weight Bearing Restrictions: No LLE Weight Bearing: Weight bearing as tolerated  Pt received seated in recliner at bedside with wife and daughter in law present. Pt and family agreeable to PT session with emphasis on discharge planning. Pt's reports L LE pain better than previous day and is more mobile. Pt's daughter in law reports patient's bed at home is 30 inches high. PT recommended pt to perform backward step on to stool (3-4 inch height) with RW to get in bed. PT demonstrated technique with step stool to patient and family.  Pt agreeable to try technique tomorrow. Pt and family report they own a rollator, PT discourages use and recommends RW for increased stability as pt with heavy reliance on UE's with gait and imbalance. PT also recommends pt discharge with manual wheelchair as he is only able to ambulate limited distances secondary to pain and fatigue. Pt reports SOB on room air. Pt SPO2 >90 % throughout session with finger probe. Plan to compare with ear probe to determine accuracy with reading. Plan to continue pt/family education and practice bed mobility with step stool. Pt has level entry into single level house through garage. Pt left seated in recliner at bedside with family present.    Therapy/Group: Individual Therapy  Truitt Leep Truitt Leep PT, DPT  08/27/2022, 7:45 AM

## 2022-08-27 NOTE — Progress Notes (Signed)
Occupational Therapy Session Note  Patient Details  Name: Marco Acevedo MRN: [id number] Date of Birth: 25-Jun-1934  Today's Date: 08/27/2022 OT Individual Time: 4098-1191 OT Individual Time Calculation (min): 55 min    Short Term Goals: Week 1:  OT Short Term Goal 1 (Week 1): Pt wll don LB garments with CGA using AE OT Short Term Goal 1 - Progress (Week 1): Met OT Short Term Goal 2 (Week 1): Pt will perform stall shower threshold with AD as needed and CGA OT Short Term Goal 2 - Progress (Week 1): Met OT Short Term Goal 3 (Week 1): Pt will complete grooming standing level with close S OT Short Term Goal 3 - Progress (Week 1): Met  Skilled Therapeutic Interventions/Progress Updates:    Patient seated in recliner at the time of arrival, pt indicated that he slept okay, but had some pain associated with his RLE that was addressed by nursing. The pt was in agreement with completing functional transfers from w/c LOF simulated surface, the mat , for transition to home. The family has reported that the pt has a  platform bed 30 inches in height and would benefit from additional training to improve his functional status and reduce the burden of care for others. The pt also requires a step height of 3.5 inches with adequate landing for transfers using the RW for functional balance. Upon entering the gym, the pt indicated that he had an emergency and  needed to urinate immediately and he couldn't wait until we go to the room,  fortunately he had the urinal and a curtain was available for privacy. The pt was able to manage his clothing items and the urinal with close S. The pt was able to ambulate to the sanitizer unit to clean his hand using the RW with close S and additional time.  The pt presents as easily distracted an required redirecting, however, her was able to practice coming from sit to stand at w/c LOF using the RW with CGA .  The pt was able to back up and  step onto the landing of the step using  his good foot first  following through  with his LLE.  The pt then sat on the mat which was raised to 30 inches and  he was able to hike his hips back on the mat for proper placement to reduce his instances for a fall.  The pt practiced this process 2x.   The pt was then transported to his room and was able to transfer from w/c LOF to the  recliner using the arm of the recliner for completion of a squat pivot transfer at CGA.   At the end of the session, the call light and bedside table were both within reach and all additional needs were addressed prior to exiting the room.  Therapy Documentation Precautions:  Precautions Precautions: Fall Precaution Comments: WBAT, no precautions Restrictions Weight Bearing Restrictions: No LLE Weight Bearing: Weight bearing as tolerated   Therapy/Group: Individual Therapy  Lavona Mound 08/27/2022, 3:46 PM

## 2022-08-27 NOTE — Progress Notes (Signed)
PROGRESS NOTE   Subjective/Complaints:   Pt reports slept great last night- denies SOB this AM- but "spit up " since so deeply asleep. Denies SOB currently.   3L O2 but it's on his chest.   VQ scan today per pt/nursing.   ROS:   Pt denies: (+) intermittent SOB, abd pain, CP, N/V/C/D, and vision changes   Except for HPI  Objective:   DG CHEST PORT 1 VIEW  Result Date: 08/25/2022 CLINICAL DATA:  Cough EXAM: PORTABLE CHEST 1 VIEW COMPARISON:  08/20/2022 FINDINGS: Lungs are well expanded, symmetric, and clear. No pneumothorax or pleural effusion. Cardiac size within normal limits. Pulmonary vascularity is normal. Osseous structures are age-appropriate. No acute bone abnormality. IMPRESSION: No active disease. Electronically Signed   By: Helyn Numbers M.D.   On: 08/25/2022 23:34   No results for input(s): "WBC", "HGB", "HCT", "PLT" in the last 72 hours.   No results for input(s): "NA", "K", "CL", "CO2", "GLUCOSE", "BUN", "CREATININE", "CALCIUM" in the last 72 hours.    Intake/Output Summary (Last 24 hours) at 08/27/2022 0930 Last data filed at 08/27/2022 1610 Gross per 24 hour  Intake 720 ml  Output 3675 ml  Net -2955 ml        Physical Exam: Vital Signs Blood pressure (!) 141/71, pulse 72, temperature 98 F (36.7 C), resp. rate 18, height 5\' 8"  (1.727 m), weight 87.6 kg, SpO2 95 %.         General: awake, alert, appropriate, woke to speak with me; NAD HENT: conjugate gaze; oropharynx moist CV: regular rate and rhythm; no JVD Pulmonary: sounds CTA B/L- no W/R/R- slightly decreased breath sounds as bases- O2 on chest, not in nose GI: soft, NT, ND, (+)BS- hypoactive BS Psychiatric: appropriate- less talkative Neurological: Ox3- but sleepy   MS: swelling around L hip incision- like a fluid bump- 1 inch protuberance for ~ 2-2.5 inches downwards- less swollen Skin: L hip dressing in place- different  dressing- C/D/I Extremities: no LE edema seen Skin: Warm and dry, dressing on L lateral hip MS: LLE strength 2-/5 in L HF, KE, DF and PF at 4-4+/5- limited by pain  Assessment/Plan: 1. Functional deficits which require 3+ hours per day of interdisciplinary therapy in a comprehensive inpatient rehab setting. Physiatrist is providing close team supervision and 24 hour management of active medical problems listed below. Physiatrist and rehab team continue to assess barriers to discharge/monitor patient progress toward functional and medical goals  Care Tool:  Bathing    Body parts bathed by patient: Right arm, Chest, Left arm, Abdomen, Front perineal area, Left upper leg, Right upper leg, Face, Left lower leg, Right lower leg   Body parts bathed by helper: Buttocks     Bathing assist Assist Level: Minimal Assistance - Patient > 75%     Upper Body Dressing/Undressing Upper body dressing   What is the patient wearing?: Pull over shirt    Upper body assist Assist Level: Independent with assistive device    Lower Body Dressing/Undressing Lower body dressing      What is the patient wearing?: Underwear/pull up, Pants     Lower body assist Assist for lower body dressing: Minimal Assistance -  Patient > 75%     Editor, commissioning assist Assist for toileting: Contact Guard/Touching assist     Transfers Chair/bed transfer  Transfers assist     Chair/bed transfer assist level: Minimal Assistance - Patient > 75%     Locomotion Ambulation   Ambulation assist      Assist level: Contact Guard/Touching assist Assistive device: Walker-rolling Max distance: 25   Walk 10 feet activity   Assist     Assist level: Contact Guard/Touching assist Assistive device: Walker-rolling   Walk 50 feet activity   Assist Walk 50 feet with 2 turns activity did not occur: Safety/medical concerns (pain, weakness)  Assist level: Contact Guard/Touching  assist Assistive device: Walker-rolling    Walk 150 feet activity   Assist Walk 150 feet activity did not occur: Safety/medical concerns         Walk 10 feet on uneven surface  activity   Assist     Assist level: Contact Guard/Touching assist     Wheelchair     Assist Is the patient using a wheelchair?: Yes Type of Wheelchair: Manual    Wheelchair assist level: Supervision/Verbal cueing Max wheelchair distance: 150 ft    Wheelchair 50 feet with 2 turns activity    Assist        Assist Level: Supervision/Verbal cueing   Wheelchair 150 feet activity     Assist      Assist Level: Supervision/Verbal cueing   Blood pressure (!) 141/71, pulse 72, temperature 98 F (36.7 C), resp. rate 18, height 5\' 8"  (1.727 m), weight 87.6 kg, SpO2 95 %.  Medical Problem List and Plan: 1. Functional deficits secondary to left intertrochanteric femur fracture S/P IM nail and ORIF- is WBAT             -patient may shower             -ELOS/Goals: 7 days modI             d/c 5/23  L hip xray just normal swelling- hardware looks OK  Advised pt to not get OOB without staff  Con't CIR PT and OT  Getting VQ scan due to elevated Cr issues-  2.  Antithrombotics: -DVT/anticoagulation:  Pharmaceutical: Lovenox 30 mg X 4 weeks             -antiplatelet therapy: aspirin 81 mg daily   3. Pain Management: Tylenol as needed             -continue gabapentin 300 mg BID             -continue Lidoderm daily   5/9- switched gabapentin to 600 mg QHS since pt only took 300 mg QHS at home- also on Oxy 5-10 mg q4 hours prn- asked pt to take prior to AM therapy.   5/10-reports pain controlled overall, continue current regimen  5/16- having more sacral pain- L lateral- will have pt try Lidoderm patches he already has ordered, but refused because thought were Salonpas 5/17- Lidoderm patches were helpful- just having L groin pain- xray shows intact hardware- swelling improving Encouraged  pt to take pain meds in Am and lunchtime for L groin pain. Explained it's from L hip 5/18- having improved ROM/movement in L hip/LLE- con't regimen 4. Mood/Behavior/Sleep: LCSW to evaluate and provide emotional support             -antipsychotic agents: n/a   5. Neuropsych/cognition: This patient is capable of making decisions on his own behalf.  6. Skin/Wound Care: Routine skin care checks   5/9- Dressing in place- con't dressings 7. Fluids/Electrolytes/Nutrition: Routine Is and Os and follow-up chemistries             -carb modified diet   8: Hypertension: monitor TID and prn             -continue Norvasc 5  mg daily             -continue Coreg 3.125 mg BID             -continue Imdur 120 mg daily   5/9- BP labile- 110s-180s in last 24 hours- will monitor for trend  -5/10 BP controlled overall, continue current regimen however will schedule Coreg earlier in the morning per patient's request  5/19- BP's 100s to 150s- somewhat labile- could be pain related? Con't to monitor trend    08/27/2022    5:46 AM 08/26/2022    7:32 PM 08/26/2022    2:05 PM  Vitals with BMI  Systolic 141 158 161  Diastolic 71 78 42  Pulse 72 87 68    9: Hyperlipidemia: continue statin     10: BPH/urinary frequency/retention: continue Proscar   11: History of gout: continue allopurinol   12: Left hip fracture s/p IM nail 5/03 Dr. Blanchie Dessert             -WBAT LLE             -change dressing as needed for soiling/if wet   5.13- looks good- asked nursing to change, since folding up constantly.  13: CAD: s/p stenting X 2; stable. On statin, aspirin and BB. Restart Plavix   14: CKD stage IIIb>>IV: baseline Cr ~2.5 (home on Farxiga)             -follow-up BMP   5/9- Will restart Farxiga-   5/13- Cr up slightly to 2.71 and BUN 57- will recheck Wednesday and if not better, will need to give some fluids. 5/15- Cr down to 2.54 from 2.71 and BUN slightly better at 52 from 57- will con't to have pt push  fluids and recheck next Monday.  5/19- labs in AM   15: COPD: stable             -continue albuterol inhaler prn phlegm   16: GERD/HH: continue Protonix and Carafate   17: OSA: not using CPAP (he is not clear regarding results of sleep study)   18: DM-2: CBGs QID and SSI (home on glipizide and Farxiga)  A1c 6.5%   5/9- will not restart Glipizide Cr is 2.42- will restart Farxiga 10 mg daily.   -5/10 CBGs a little improved, continue to monitor with restart of farxiga  5/13- CBG's 152-179 in last 24 hours- improved- will NOT restart glipizide  5/14- CBGs 154-181 in last 24 hours- will con't regimen  5/15- CBGs 130-184- con't regimen  5/18- CBGs stable- con't regimen 19: Chronic diastolic CHF: continue meds as above             -daily weight   5/9- Weight 88.2 kg today  5/10 wt stable,continue to follow  5/11: weight reviewed and has decreased  5/13- Weight up slightly 0.6 kg- will monitor  5/14- weight up to 90.8 kg, however no LE edema and hasn't had BM in 2 days. Will monitor trend  5/16- Weight 87.6 kg- down slightly.   5/17- Weight 86.2 kg  5/18- weight 87.6 kg- if goes up again, will give Lasix  5/19- no weight today- per nursing, working on it.  Filed Weights   08/24/22 0243 08/25/22 0531 08/26/22 0302  Weight: 87.6 kg 86.2 kg 87.6 kg    20: ABLA, chronic anemia: follow-up CBC             -restart PO iron   5/9- Hb up to 10- con't to monitor  5/13- Hb slightly decreased to 9.7 21: Pruritus/rash on back: will order Sarna              22: Tremors: continue primidone   23. Suboptimal vitamin D: start ergocalciferol 50,000U once per week   23. Hypotension: currently hypotensive, will d/c amlodipine  24. Loose stools/cosntipation discussed that these have improved  5/14- Now feels constipated- LBM 2 days ago- will give low dose Sorbitol 15cc at 11am, since doesn't want to have BM on night shift- also, was on Dulcolax tab vs Supp at home daily- will see if needs to add  something. Will add Colace 1 tab daily since stool hard and Dulcolax 5 mg daily tablet.   5/15- pt said had "blow out"- after sorbitol- will decrease miralax to 17 G daily as well as stop Colace- since he has Dulcolax tab- pt insistent he only needs Dulcolax, but explained on opiates which constipated pt- he needs more meds.   5/19- LBM yesterday 25. Indigestion: may overlap with chronic angina, discussed that his outpatient cardiologist prescribed nitroglycerin and this gives him relief, ordered here. 5/18- takes NTG for GERD   26. Shortness of breath: Albuterol ordered  5/18- added albuterol nebs scheduled for 1 day  5/19- back to prn today, but advised pt to ask for them 27. Upper airway congestion: incentive spirometer ordered  28. Desaturation to 82 percent: CXR ordered   5/13- CXR (-) for anything acute  5/18-5/19 per #31 29. Urinary frequency and urgency  5/13- U/A (-) except for high glucose level- has order for timed voiding- will con't 5/16- Purewick at night- pt insistent since was tried. . 5/17- pt insistent that nursing needs to come immediately when has urge to void-  upset that nursing not here during report this AM- wants [place] to leave RW where he can get up on his own- explained we cannot do that due to fall risk.  30. Anal hemorrhoids  5/13- add Anusol suppository per pt request.   5/15- says is going a little better 31. Hypoxia/Drop in O2 sats  5/18- drop in O2 sats overnight- will schedule Albuterol nebs for 24 hours- wait on giving Lasix since -pt doesn't want AND CXR (-) for vascular congestion; will restart Guafenisin BID as well and check CT chest with contrast- is pending  5/19- VQ scan due to be done today, hopefully-  changed ot Lovenox 1mg /kg so can cover if has actually had PE- d dimer won't be helpful due to inflammatory changes/hip fx, most likely. Also, his O2 sats are variable- sometimes low in 80s and sometimes normal- CXR was negative Friday night- when these  things were occurring. Gave albuterol nebs- were somewhat helpful per pt- educated him to ask for them when SOB- wasn't wearing O2 this Am, and denied SOB.     I spent a total of 50   minutes on total care today- >50% coordination of care- due to prolonged d/w pharmacy about Lovenox, due to CKD; also nursing and pushing to get VQ scan- I'm wondering if he has PE? Treating with tx dose lovenox.   LOS: 11 days A FACE TO  FACE EVALUATION WAS PERFORMED  Lailie Smead 08/27/2022, 9:30 AM

## 2022-08-27 NOTE — Progress Notes (Signed)
Attempted to call nuclear medicine for follow up on ordered [facility] Pulmonary Perf and Vent. Will pass on to dayshift nurse to follow up. Patient in bed resting with call bell within reach.

## 2022-08-28 ENCOUNTER — Inpatient Hospital Stay (HOSPITAL_COMMUNITY): Payer: No Typology Code available for payment source

## 2022-08-28 DIAGNOSIS — M25552 Pain in left hip: Secondary | ICD-10-CM | POA: Diagnosis not present

## 2022-08-28 DIAGNOSIS — S728X2A Other fracture of left femur, initial encounter for closed fracture: Secondary | ICD-10-CM | POA: Diagnosis not present

## 2022-08-28 DIAGNOSIS — R0902 Hypoxemia: Secondary | ICD-10-CM

## 2022-08-28 DIAGNOSIS — N184 Chronic kidney disease, stage 4 (severe): Secondary | ICD-10-CM | POA: Diagnosis not present

## 2022-08-28 DIAGNOSIS — D62 Acute posthemorrhagic anemia: Secondary | ICD-10-CM

## 2022-08-28 DIAGNOSIS — I10 Essential (primary) hypertension: Secondary | ICD-10-CM | POA: Diagnosis not present

## 2022-08-28 LAB — GLUCOSE, CAPILLARY
Glucose-Capillary: 135 mg/dL — ABNORMAL HIGH (ref 70–99)
Glucose-Capillary: 164 mg/dL — ABNORMAL HIGH (ref 70–99)
Glucose-Capillary: 134 mg/dL — ABNORMAL HIGH (ref 70–99)
Glucose-Capillary: 127 mg/dL — ABNORMAL HIGH (ref 70–99)

## 2022-08-28 LAB — CBC
HCT: 30.7 % — ABNORMAL LOW (ref 39.0–52.0)
Hemoglobin: 9.7 g/dL — ABNORMAL LOW (ref 13.0–17.0)
MCH: 28.8 pg (ref 26.0–34.0)
MCHC: 31.6 g/dL (ref 30.0–36.0)
MCV: 91.1 fL (ref 80.0–100.0)
Platelets: 306 10*3/uL (ref 150–400)
RBC: 3.37 MIL/uL — ABNORMAL LOW (ref 4.22–5.81)
RDW: 14.6 % (ref 11.5–15.5)
WBC: 7.8 10*3/uL (ref 4.0–10.5)
nRBC: 0 % (ref 0.0–0.2)

## 2022-08-28 LAB — BASIC METABOLIC PANEL
Anion gap: 13 (ref 5–15)
BUN: 47 mg/dL — ABNORMAL HIGH (ref 8–23)
CO2: 22 mmol/L (ref 22–32)
Calcium: 8.7 mg/dL — ABNORMAL LOW (ref 8.9–10.3)
Chloride: 101 mmol/L (ref 98–111)
Creatinine, Ser: 2.53 mg/dL — ABNORMAL HIGH (ref 0.61–1.24)
GFR, Estimated: 24 mL/min — ABNORMAL LOW (ref >60)
Glucose, Bld: 138 mg/dL — ABNORMAL HIGH (ref 70–99)
Potassium: 4 mmol/L (ref 3.5–5.1)
Sodium: 136 mmol/L (ref 135–145)

## 2022-08-28 MED ORDER — ENOXAPARIN SODIUM 80 MG/0.8ML IJ SOSY: 80.0000 mg | PREFILLED_SYRINGE | INTRAMUSCULAR | Status: DC

## 2022-08-28 NOTE — Progress Notes (Addendum)
Alerted by RN that the nuclear medicine department has notified her that the patient needs a PA and lateral chest x-ray prior to VQ scan.  Weekend progress notes reviewed.  VQ scan was ordered by Dr. Berline Chough on Saturday.  (Due to his renal function CT with PE protocol and IV contrast is contraindicated.)  Placed order for PA and lateral chest x-ray.  I was then alerted by his attendant RN that he refused his Lovenox injection.  I explained to the patient and his wife the importance of this medication as well as Dr. Dahlia Client dosing adjustment yesterday for PE treatment (1 mg/kg q 24 hours for CrCl <30) in the event he has a PE.  He currently remains hemodynamically stable.  I spoke directly to his physical therapist regarding his afternoon session and he did well without desaturation.  He then agreed to the Lovenox injection and I was present as his nurse administered it.  I asked her to follow-up with nuclear medicine in regards to timing of his exam and we have now been told by nuclear medicine department that it will be performed at 8:30 tomorrow morning.  I have relayed this information to the patient and his wife.  Continue to closely monitor.  Vital signs at 2 PM: Temperature 98.1, respiratory rate 19, pulse 64, blood pressure 122/50.  SaO2 93% on room air.  Recheck VS at 3 pm: 126/58, HR 60, Temp 98.1, resp 18, O2 97 on room air

## 2022-08-28 NOTE — Progress Notes (Signed)
Patient refused Lovenox injection, explained the risks. Then notified PA.

## 2022-08-28 NOTE — Progress Notes (Signed)
Patient spoke with PA regarding Lovenox injections. PA states he is willing to take it and can give the morning dose. Injection given to LLQ.

## 2022-08-28 NOTE — Progress Notes (Signed)
Physical Therapy Session Note  Patient Details  Name: Marco Acevedo MRN: [id number] Date of Birth: [birth year]  Today's Date: 08/28/2022 PT Individual Time: 0901-1011 + 1255-1405 PT Individual Time Calculation (min): 70 min + 70 min  Short Term Goals: Week 2:  PT Short Term Goal 1 (Week 2): STG=LTG due to ELOS  Skilled Therapeutic Interventions/Progress Updates:     Session 1: Chart reviewed and pt agreeable to therapy. Pt received seated in recliner with no c/o pain. Also of note, SpO2 WNL t/o session. Session focused on transfer into bed per home set up, balance, and amb endurance to promote safe home access. Pt initiated session with amb to WC using CGA + RW and was then transferred to therapy gym for time. Pt then completed blocked practice of backwards step onto step stool to sit on 30" bed. Pt noted to only require supervision + RW for steps ranging in heights of 3.5"-5" with good safety during movement. Pt then completed blocked practice of basketball shooting in standing with CGA for balance. Pt then amb 25ft towards room with CGA progressing to supervision + RW and good safety awareness for endurance limits. PT and pt then discussed safe return to rollator with pt verbalizing understanding of need to improve balance before rollator use. At end of session, pt was left seated in recliner with alarm engaged, nurse call bell and all needs in reach.  Session 2: Chart reviewed and pt agreeable to therapy. Pt received seated in recliner with no c/o pain. Session focused on amb endurance and review of mobility goals to promote safe d/c and return to home. Pt initiated session with amb to/from toilet using supervision + RW. Pt required MaxA for peri-care. Pt then taken to therapy gym for time management. Pt then completed blocked practice of small steps (3") completing rounds of 8 steps with B rails + supervision. Pt then completed + 6 minson NuStep for interval training at workloads 3-9. Pt  then c/o high fatigue and returned to room. In room, pt completed amb to recliner with supervision + RW. Session education emphasized need for pt to adhere to safety belt precaution. Pt conveyed to PT that pt had been pulling alarm close and disengaging in order to stand and urinate 2/2 long wait periods before staff enters room when called. PT relayed to pt importance of adhering to call and safety belt rules. Pt verbalized understanding of reasoning, but conveyed continued frustration with lack of speedy response from staff when pt calling for help to urinate. At end of session, pt was left seated in recliner with lap belt alarm engaged, nurse call bell and all needs in reach. PT relayed safety belt conversation to NT and RN.  Therapy Documentation Precautions:  Precautions Precautions: Fall Precaution Comments: WBAT, no precautions Restrictions Weight Bearing Restrictions: No LLE Weight Bearing: Weight bearing as tolerated    Therapy/Group: Individual Therapy  Dionne Milo, PT, DPT 08/28/2022, 12:34 PM

## 2022-08-28 NOTE — Progress Notes (Signed)
This Clinical research associate informed by charged nurse that this patient needed an x-ray prior to getting his VQ scan. Informed PA Dois Davenport that radiology was requesting that patient have a chest xray. PA states patient had an xray on 5/17. Informed charge nurse patient had one recently. Was informed to check with radiology. Called radiology and was informed that patient needed a 2 view chest x-ray prior to receiving his scan. Informed PA of this finding. STAT chest xray ordered for the patient. Nurse was called over walkie while performing other task in another room, came to phone no one present at the time. Was informed by charge it was radiology. Called radiology back at [phone]. Asked if anyone had attempted to call this nurse in regards to this patient's xray OR scan. Was informed they could not see a reason as to why I would be called. Continued on with other tasks. When back in this patient's room, PA asked about scan. Was informed to call Nuclear Med. Spoke with supervisor who answered, states he tried to reach out via phone call to the unit and received no answer. Was informed that the scan will have to take place tomorrow around 8:30am, as there is no slot available. Informed PA and charge nurse about this matter.

## 2022-08-28 NOTE — Progress Notes (Signed)
PROGRESS NOTE   Subjective/Complaints:   PT reports no pain. He reports he is feeling better since guaifenesin was restarted.  Denies  SOB at this time-reports this is improving.  Report BM yesterday.    ROS:   Pt denies: (+) intermittent SOB, + cough, abd pain, CP, N/V/C/D, and vision changes   Except for HPI  Objective:   DG Chest 2 View  Result Date: 08/28/2022 CLINICAL DATA:  Shortness of breath. EXAM: CHEST - 2 VIEW COMPARISON:  08/25/2022 FINDINGS: Cardiopericardial silhouette is at upper limits of normal for size. Right lung clear. Retrocardiac left base atelectasis or infiltrate noted. No pleural effusion. No acute bony abnormality. IMPRESSION: Retrocardiac left base atelectasis or infiltrate. Electronically Signed   By: Kennith Center M.D.   On: 08/28/2022 12:11   Recent Labs    08/28/22 0532  WBC 7.8  HGB 9.7*  HCT 30.7*  PLT 306     Recent Labs    08/28/22 0532  NA 136  K 4.0  CL 101  CO2 22  GLUCOSE 138*  BUN 47*  CREATININE 2.53*  CALCIUM 8.7*      Intake/Output Summary (Last 24 hours) at 08/28/2022 1725 Last data filed at 08/28/2022 1300 Gross per 24 hour  Intake 356 ml  Output 2200 ml  Net -1844 ml         Physical Exam: Vital Signs Blood pressure (!) 126/58, pulse 60, temperature 98.1 F (36.7 C), temperature source Oral, resp. rate 18, height 5\' 8"  (1.727 m), weight 83.1 kg, SpO2 97 %.         General: awake, alert, appropriate, NAD, supine in bed HENT: conjugate gaze; oropharynx moist CV: regular rate and rhythm; no JVD Pulmonary: sounds CTA B/L- no W/R/R- slightly decreased breath sounds as bases- O2 [place] in place 1L GI: soft, NT, ND, (+)BS- hypoactive BS Psychiatric: appropriate- pleasant Neurological: Ox3- alert   MS: swelling around L hip incision- like a fluid bump- 1 inch protuberance for ~ 2-2.5 inches downwards- less swollen Skin: L hip dressing in place-  different dressing- C/D/I Extremities: no LE edema seen Skin: Warm and dry, dressing on L lateral hip MS: LLE strength 2-/5 in L HF, KE, DF and PF at 4-4+/5- limited by pain  Assessment/Plan: 1. Functional deficits which require 3+ hours per day of interdisciplinary therapy in a comprehensive inpatient rehab setting. Physiatrist is providing close team supervision and 24 hour management of active medical problems listed below. Physiatrist and rehab team continue to assess barriers to discharge/monitor patient progress toward functional and medical goals  Care Tool:  Bathing    Body parts bathed by patient: Right arm, Chest, Left arm, Abdomen, Front perineal area, Left upper leg, Right upper leg, Face, Left lower leg, Right lower leg   Body parts bathed by helper: Buttocks     Bathing assist Assist Level: Minimal Assistance - Patient > 75%     Upper Body Dressing/Undressing Upper body dressing   What is the patient wearing?: Pull over shirt    Upper body assist Assist Level: Independent with assistive device    Lower Body Dressing/Undressing Lower body dressing      What is the  patient wearing?: Underwear/pull up, Pants     Lower body assist Assist for lower body dressing: Minimal Assistance - Patient > 75%     Toileting Toileting    Toileting assist Assist for toileting: Contact Guard/Touching assist     Transfers Chair/bed transfer  Transfers assist     Chair/bed transfer assist level: Minimal Assistance - Patient > 75%     Locomotion Ambulation   Ambulation assist      Assist level: Contact Guard/Touching assist Assistive device: Walker-rolling Max distance: 25   Walk 10 feet activity   Assist     Assist level: Contact Guard/Touching assist Assistive device: Walker-rolling   Walk 50 feet activity   Assist Walk 50 feet with 2 turns activity did not occur: Safety/medical concerns (pain, weakness)  Assist level: Contact Guard/Touching  assist Assistive device: Walker-rolling    Walk 150 feet activity   Assist Walk 150 feet activity did not occur: Safety/medical concerns         Walk 10 feet on uneven surface  activity   Assist     Assist level: Contact Guard/Touching assist     Wheelchair     Assist Is the patient using a wheelchair?: Yes Type of Wheelchair: Manual    Wheelchair assist level: Supervision/Verbal cueing Max wheelchair distance: 150 ft    Wheelchair 50 feet with 2 turns activity    Assist        Assist Level: Supervision/Verbal cueing   Wheelchair 150 feet activity     Assist      Assist Level: Supervision/Verbal cueing   Blood pressure (!) 126/58, pulse 60, temperature 98.1 F (36.7 C), temperature source Oral, resp. rate 18, height 5\' 8"  (1.727 m), weight 83.1 kg, SpO2 97 %.  Medical Problem List and Plan: 1. Functional deficits secondary to left intertrochanteric femur fracture S/P IM nail and ORIF- is WBAT             -patient may shower             -ELOS/Goals: 7 days modI             d/c 5/23  L hip xray just normal swelling- hardware looks OK  Advised pt to not get OOB without staff  Con't CIR PT and OT  Getting VQ scan due to elevated Cr issues- planned for tomorrow AM  2.  Antithrombotics: -DVT/anticoagulation:  Pharmaceutical: Lovenox 30 mg X 4 weeks             -antiplatelet therapy: aspirin 81 mg daily   3. Pain Management: Tylenol as needed             -continue gabapentin 300 mg BID             -continue Lidoderm daily   5/9- switched gabapentin to 600 mg QHS since pt only took 300 mg QHS at home- also on Oxy 5-10 mg q4 hours prn- asked pt to take prior to AM therapy.   5/10-reports pain controlled overall, continue current regimen  5/16- having more sacral pain- L lateral- will have pt try Lidoderm patches he already has ordered, but refused because thought were Salonpas 5/17- Lidoderm patches were helpful- just having L groin pain- xray  shows intact hardware- swelling improving Encouraged pt to take pain meds in Am and lunchtime for L groin pain. Explained it's from L hip 5/18- having improved ROM/movement in L hip/LLE- con't regimen  5/20 Pt reports pain controlled, has not had prn hydrocodone today  4. Mood/Behavior/Sleep: LCSW to evaluate and provide emotional support             -antipsychotic agents: n/a   5. Neuropsych/cognition: This patient is capable of making decisions on his own behalf.   6. Skin/Wound Care: Routine skin care checks   5/9- Dressing in place- con't dressings 7. Fluids/Electrolytes/Nutrition: Routine Is and Os and follow-up chemistries             -carb modified diet   8: Hypertension: monitor TID and prn             -continue Norvasc 5  mg daily             -continue Coreg 3.125 mg BID             -continue Imdur 120 mg daily   5/9- BP labile- 110s-180s in last 24 hours- will monitor for trend  -5/10 BP controlled overall, continue current regimen however will schedule Coreg earlier in the morning per patient's request  5/19- BP's 100s to 150s- somewhat labile- could be pain related? Con't to monitor trend  5/20 Bps controlled, a little softer this am, continue to monitor for now and continue current regimen    08/28/2022    4:00 PM 08/28/2022    2:58 PM 08/28/2022    2:30 PM  Vitals with BMI  Systolic 126 122 92  Diastolic 58 50 47  Pulse 60 64 63    9: Hyperlipidemia: continue statin     10: BPH/urinary frequency/retention: continue Proscar   11: History of gout: continue allopurinol   12: Left hip fracture s/p IM nail 5/03 Dr. Blanchie Dessert             -WBAT LLE             -change dressing as needed for soiling/if wet   5.13- looks good- asked nursing to change, since folding up constantly.  13: CAD: s/p stenting X 2; stable. On statin, aspirin and BB. Restart Plavix   14: CKD stage IIIb>>IV: baseline Cr ~2.5 (home on Farxiga)             -follow-up BMP   5/9- Will restart  Farxiga-   5/13- Cr up slightly to 2.71 and BUN 57- will recheck Wednesday and if not better, will need to give some fluids. 5/15- Cr down to 2.54 from 2.71 and BUN slightly better at 52 from 57- will con't to have pt push fluids and recheck next Monday.  5/20- Cr and BUN stable today at 2.52/47, continue to encourage fluids  15: COPD: stable             -continue albuterol inhaler prn phlegm   16: GERD/HH: continue Protonix and Carafate   17: OSA: not using CPAP (he is not clear regarding results of sleep study)   18: DM-2: CBGs QID and SSI (home on glipizide and Farxiga)  A1c 6.5%   5/9- will not restart Glipizide Cr is 2.42- will restart Farxiga 10 mg daily.   -5/10 CBGs a little improved, continue to monitor with restart of farxiga  5/13- CBG's 152-179 in last 24 hours- improved- will NOT restart glipizide  5/14- CBGs 154-181 in last 24 hours- will con't regimen  5/15- CBGs 130-184- con't regimen  5/18- CBGs stable- con't regimen  CBG (last 3)  Recent Labs    08/28/22 0608 08/28/22 1141 08/28/22 1641  GLUCAP 135* 164* 134*    19: Chronic diastolic CHF: continue meds as above             -  daily weight   5/9- Weight 88.2 kg today  5/10 wt stable,continue to follow  5/11: weight reviewed and has decreased  5/13- Weight up slightly 0.6 kg- will monitor  5/14- weight up to 90.8 kg, however no LE edema and hasn't had BM in 2 days. Will monitor trend  5/16- Weight 87.6 kg- down slightly.   5/17- Weight 86.2 kg  5/18- weight 87.6 kg- if goes up again, will give Lasix   5/19- no weight today- per nursing, working on it.   5/20 wt a little down today, continue to monitor Filed Weights   08/25/22 0531 08/26/22 0302 08/28/22 0500  Weight: 86.2 kg 87.6 kg 83.1 kg    20: ABLA, chronic anemia: follow-up CBC             -restart PO iron   5/9- Hb up to 10- con't to monitor  5/13- Hb slightly decreased to 9.7  5/20 Hb stable at 9.7 21: Pruritus/rash on back: will order Sarna               22: Tremors: continue primidone   23. Suboptimal vitamin D: start ergocalciferol 50,000U once per week   23. Hypotension: currently hypotensive, will d/c amlodipine  24. Loose stools/cosntipation discussed that these have improved  5/14- Now feels constipated- LBM 2 days ago- will give low dose Sorbitol 15cc at 11am, since doesn't want to have BM on night shift- also, was on Dulcolax tab vs Supp at home daily- will see if needs to add something. Will add Colace 1 tab daily since stool hard and Dulcolax 5 mg daily tablet.   5/15- pt said had "blow out"- after sorbitol- will decrease miralax to 17 G daily as well as stop Colace- since he has Dulcolax tab- pt insistent he only needs Dulcolax, but explained on opiates which constipated pt- he needs more meds.   5/19- LBM yesterday 25. Indigestion: may overlap with chronic angina, discussed that his outpatient cardiologist prescribed nitroglycerin and this gives him relief, ordered here. 5/18- takes NTG for GERD   26. Shortness of breath: Albuterol ordered  5/18- added albuterol nebs scheduled for 1 day  5/19- back to prn today, but advised pt to ask for them 27. Upper airway congestion: incentive spirometer ordered  28. Desaturation to 82 percent: CXR ordered   5/13- CXR (-) for anything acute  5/18-5/19 per #31 29. Urinary frequency and urgency  5/13- U/A (-) except for high glucose level- has order for timed voiding- will con't 5/16- Purewick at night- pt insistent since was tried. . 5/17- pt insistent that nursing needs to come immediately when has urge to void-  upset that nursing not here during report this AM- wants [place] to leave RW where he can get up on his own- explained we cannot do that due to fall risk.  30. Anal hemorrhoids  5/13- add Anusol suppository per pt request.   5/15- says is going a little better 31. Hypoxia/Drop in O2 sats  5/18- drop in O2 sats overnight- will schedule Albuterol nebs for 24 hours- wait on  giving Lasix since -pt doesn't want AND CXR (-) for vascular congestion; will restart Guafenisin BID as well and check CT chest with contrast- is pending  5/19- VQ scan due to be done today, hopefully-  changed ot Lovenox 1mg /kg so can cover if has actually had PE- d dimer won't be helpful due to inflammatory changes/hip fx, most likely. Also, his O2 sats are variable- sometimes low in 80s  and sometimes normal- CXR was negative Friday night- when these things were occurring. Gave albuterol nebs- were somewhat helpful per pt- educated him to ask for them when SOB- wasn't wearing O2 this Am, and denied SOB.   5/20 pt reports breathing is improved today, Clinical appears improved, CXR with retrocardiac left base atelectasis or infiltrate. O2 sat last was 97 on RA, VQ scan tomorrow.   LOS: 12 days A FACE TO FACE EVALUATION WAS PERFORMED  Fanny Dance 08/28/2022, 5:25 PM

## 2022-08-29 ENCOUNTER — Inpatient Hospital Stay (HOSPITAL_COMMUNITY): Payer: No Typology Code available for payment source

## 2022-08-29 DIAGNOSIS — S728X2A Other fracture of left femur, initial encounter for closed fracture: Secondary | ICD-10-CM | POA: Diagnosis not present

## 2022-08-29 DIAGNOSIS — F54 Psychological and behavioral factors associated with disorders or diseases classified elsewhere: Secondary | ICD-10-CM | POA: Diagnosis not present

## 2022-08-29 LAB — GLUCOSE, CAPILLARY
Glucose-Capillary: 133 mg/dL — ABNORMAL HIGH (ref 70–99)
Glucose-Capillary: 168 mg/dL — ABNORMAL HIGH (ref 70–99)
Glucose-Capillary: 127 mg/dL — ABNORMAL HIGH (ref 70–99)
Glucose-Capillary: 145 mg/dL — ABNORMAL HIGH (ref 70–99)

## 2022-08-29 MED ORDER — TECHNETIUM TO 99M ALBUMIN AGGREGATED
3.8000 | Freq: Once | INTRAVENOUS | Status: AC | PRN
  Administered 2022-08-29: 3.8 via INTRAVENOUS

## 2022-08-29 MED ORDER — POLYETHYLENE GLYCOL 3350 17 G PO PACK
17.0000 g | PACK | ORAL | Status: DC
  Administered 2022-08-30: 17 g via ORAL
  Filled 2022-08-29: qty 1

## 2022-08-29 MED ORDER — ENOXAPARIN SODIUM 30 MG/0.3ML IJ SOSY
30.0000 mg | PREFILLED_SYRINGE | INTRAMUSCULAR | Status: DC
  Administered 2022-08-29 – 2022-08-30 (×2): 30 mg via SUBCUTANEOUS
  Filled 2022-08-29 (×2): qty 0.3

## 2022-08-29 NOTE — Progress Notes (Signed)
Physical Therapy Session Note  Patient Details  Name: Marco Acevedo MRN: [id number] Date of Birth: 17-Dec-1934  Today's Date: 08/29/2022 PT Individual Time: 4098-1191 PT Individual Time Calculation (min): 41 min   Short Term Goals: Week 2:  PT Short Term Goal 1 (Week 2): STG=LTG due to ELOS  Skilled Therapeutic Interventions/Progress Updates:    Chart reviewed and pt agreeable to therapy. Pt received seated in recliner with 2/10 c/o pain in L groin and surgical site. Also of note, son in room and PT had discussion with son and pt about AD after d/c. Session focused on family education for AD and care progression and review of functional mobility to prepare for d/c. Pt initiated session with conversation with PT and son about WC use vs rollator use vs RW use. PT explained to family and pt the need for balance support during mobility at this time, so pt is advised to use RW for short distance amb and WC for community distances. Pt and son verbalized understanding and agreement. PT also explained eventual progression to rollator with continued PT services in [facility] and/or OP settings, with pt verbalizing understanding. Pt also stated he has a RW, with son stating he will confirm. Pt then completed review of functional mobility in room. Of note, pt required modI for bed mobility, with PT and pt discussing options for home adjustment. At end of session, pt was left seated in recliner with alarm engaged, nurse call bell and all needs in reach.     Therapy Documentation Precautions:  Precautions Precautions: Fall Precaution Comments: WBAT, no precautions Restrictions Weight Bearing Restrictions: Yes LLE Weight Bearing: Weight bearing as tolerated     Therapy/Group: Individual Therapy  Dionne Milo, PT, DPT 08/29/2022, 11:59 AM

## 2022-08-29 NOTE — Patient Care Conference (Signed)
Inpatient RehabilitationTeam Conference and Plan of Care Update Date: 08/29/2022   Time: 11:37 AM    Patient Name: Marco Acevedo      Medical Record Number: [id number]  Date of Birth: 14-Mar-1935 Sex: Male         Room/Bed: 4U98J/1B14N-82 Payor Info: Payor: [employer] / Plan: [facility] / Product Type: *No Product type* /    Admit Date/Time:  08/16/2022  5:44 PM  Primary Diagnosis:  Other fracture of left femur, initial encounter for closed fracture [facility])  Hospital Problems: Principal Problem:   Other fracture of left femur, initial encounter for closed fracture [facility])    Expected Discharge Date: Expected Discharge Date: 08/31/22  Team Members Present: Physician leading conference: Dr. Genice Rouge Social Worker Present: Dossie Der, LCSW Nurse Present: Vedia Pereyra, RN PT Present: Bernie Covey, PT OT Present: Lou Cal, OT PPS Coordinator present : Fae Pippin, SLP     Current Status/Progress Goal Weekly Team Focus  Bowel/Bladder   Patient is continent of bladder/bowel with urinary urgency   maintain contience of b/b   Assess toileting needs q2-4 hours during waking times and PRN    Swallow/Nutrition/ Hydration               ADL's   Mod I UB ADLs, Setup LB  ADLs, Supervision for toileting   Mod I   LB ADLs and general conditioning    Mobility   supervision/modI bed mobility, supervision + RW for transfers + gait 285ft, supervision steps x8 (3 inch) + B rails   mod I  d/c planning, clearance for mod I    Communication                Safety/Cognition/ Behavioral Observations               Pain   Patient c/o left hip pain 5-8 on 0-10 pain scale. Uses PRN oxy   maintain pain levels 3 on 0-10 pain scale   Assess pain q4 hours and prn    Skin   Left surgical hip healing well, tenderness, edema improving no drainage   maintain infection free surgical incision  assess incision site qshift for signs of  infection      Discharge Planning:  Will ask wife to attend therapies with husband this week so can see his progress and care needs. She is unable to physically assist him due to her own health issues. Have ordered DME and will set up [facility] versus OP pt deciding what he wants.   Team Discussion: Left femur fracture. Continent B/B with large black stool 05/19. Taking Iron 325mg . Pain managed with PRN medications. Incision to left hip, glue no drainage but noted swelling. Skin tear to left elbow. C/O SOB with SATs in 80's. Daily weights. AC/HS with dietary modifications. Wife (caregiver) in hospital with pneumonia.  Patient on target to meet rehab goals: yes, Will meet goals by discharge 08/31/22  *See Care Plan and progress notes for long and short-term goals.   Revisions to Treatment Plan:  VQ scan (negative).  Monitor labs/VS. Monitor daily weights.  Teaching Needs: Medications, safety, diet modifications, self care, gait/transfer training, skin care, etc.   Current Barriers to Discharge: Decreased caregiver support and Wound care  Possible Resolutions to Barriers: Family education, diet education, energy conservation, skin care, order recommended DME     Medical Summary Current Status: pain usually controlled- has albuterol inhaler- O2 sats drop to 80s% but VQ scan is negative  Barriers to Discharge: Medical stability;Self-care education;Uncontrolled Pain;Weight bearing restrictions  Barriers to Discharge Comments: limited by still having swelling- - wife cnanot assist- likely b ein hospital- walking 282ft- will need son to help at home Possible Resolutions to [employer] Focus: goals mod I even with stairs;  urinary urgency- using purewick- but "wont need at home"- d/c 5/23   Continued Need for Acute Rehabilitation Level of Care: The patient requires daily medical management by a physician with specialized training in physical medicine and rehabilitation for the following  reasons: Direction of a multidisciplinary physical rehabilitation program to maximize functional independence : Yes Medical management of patient stability for increased activity during participation in an intensive rehabilitation regime.: Yes Analysis of laboratory values and/or radiology reports with any subsequent need for medication adjustment and/or medical intervention. : Yes   I attest that I was present, lead the team conference, and concur with the assessment and plan of the team.   Jearld Adjutant 08/29/2022, 5:58 PM

## 2022-08-29 NOTE — Progress Notes (Signed)
Patient ID: EFTHIMIOS THANE, male   DOB: May 15, 1934, 88 y.o.   MRN: [id number]  Met with pt to give team conference update and went to see wife who is on 6E to give her the update along with daughter in-law who is present. Pt continues to make good progress toward his mod/I level goals and still target discharge date of 5/23. Unsure how long wife will be in the hospital. All agreeable to getting a wheelchair and 3 in 1 have sent order to [place]. Discussed preference for Home health services and have no preference.  Referral made to [facility] they will begin insurance auth.

## 2022-08-29 NOTE — Progress Notes (Signed)
Occupational Therapy Session Note  Patient Details  Name: Marco Acevedo MRN: [id number] Date of Birth: [birth year]  Today's Date: 08/29/2022 OT Individual Time: 7829-5621 Session 1; 3086-5784 Session 2 OT Individual Time Calculation (min): 45 min, 75 min     Short Term Goals: Week 2:  OT Short Term Goal 1 (Week 2): STG=LTG d/t LOS  Skilled Therapeutic Interventions/Progress Updates:   Session 1:  Pt seen for brief session this am with request for full shower later with long session. Pt up in recliner but requesting oral care and shaving with OT for facilitating amb and standing level this am.  Pt able to amb with RW from recliner with close S, stood 6 intervals of 5 min with brief seated rests all with close S. Left pt back in recliner with all needs, nurse call button and safety measures in place.    Pain:  2/10 L groin with meds and repositioning providing relief     Session 2:  Pt in recliner ready for full AM self care retraining visit including shower level bathing and AE for LB dressing. Pt amb with close S from recliner to stall shower. TTB for seated doffing of all garments with reacher for L LE and close S. OT covered all incisions and wounds for waterproofing. Pt able to complete entire shower routine with AE using LH sponge seated and standing with gab bars with close S. Dressing LB with AE with increased time and effort but close S. Seated level hair drying with mod I. Returned to recliner amb level and left with all needs and safety measures in place.   Pain:  Denied all pain    Therapy Documentation Precautions:  Precautions Precautions: Fall Precaution Comments: WBAT, no precautions Restrictions Weight Bearing Restrictions: Yes LLE Weight Bearing: Weight bearing as tolerated    Therapy/Group: Individual Therapy  Vicenta Dunning 08/29/2022, 7:55 AM

## 2022-08-29 NOTE — Progress Notes (Addendum)
Occupational Therapy Session Note  Patient Details  Name: Marco Acevedo MRN: [id number] Date of Birth: Dec 20, 1934  Today's Date: 08/29/2022 OT Missed Time: 40 Minutes Missed Time Reason: Other (comment) (Radiology)   Short Term Goals: Week 2:  OT Short Term Goal 1 (Week 2): STG=LTG d/t LOS  Skilled Therapeutic Interventions/Progress Updates:  Pt received on his way to radiology. Pt assisted with transfer from recliner to [facility], requiring overall supervision. Pt missing ~40 mins of skilled OT, to be made up as appropriate.   Therapy Documentation Precautions:  Precautions Precautions: Fall Precaution Comments: WBAT, no precautions Restrictions Weight Bearing Restrictions: Yes LLE Weight Bearing: Weight bearing as tolerated   Therapy/Group: Individual Therapy  Lou Cal, OTR/L, MSOT  08/29/2022, 6:25 AM

## 2022-08-29 NOTE — Consult Note (Signed)
Neuropsychological Consultation Comprehensive Inpatient Rehab   Patient:   Marco Acevedo   DOB:   1935-04-02  MR Number:  [id number]  Location:  [facility] [address] CENTER A [id number] [place] [address] Dept: [phone] Loc: [phone]           Date of Service:   08/29/2022  Start Time:   1 PM End Time:   2 PM  Provider/Observer:  Arley Phenix, Psy.D.       Clinical Neuropsychologist       Billing Code/Service: [phone]  Reason for Service:    Marco Acevedo is an 88 year old male referred for neuropsychological consultation during his ongoing inpatient admission onto the comprehensive inpatient rehabilitation unit.  Patient had presented to [facility] emergency department on 08/10/2022 after a fall when he missed a step on a stair and fell onto his left side.  The patient reports that [place] into [place] clubhouse to get some water and upon leaving the area to get water there was a steep step he was attempting to descend that triggered a fall onto his left side.  Patient immediately complained of severe left buttock pain and was unable to get up.  X-rays at the emergency department showed left hip fracture with orthopedic consultation conducted.  Trauma scans of head and neck CT were negative and left elbow was negative etc.  Patient underwent IM nail/ORIF orthopedic interventions.  Patient is now on the comprehensive inpatient rehab unit requiring rehabilitation services for ongoing dysfunction secondary to left hip fracture.  During the clinical visit today the patient was oriented and aware of his status and current admission onto the inpatient rehab unit.  Patient was very fixated on his injury and how it occurred staying rather focused on the step that he fell going down and responsibility for this obstacle being very dangerous and that other people are following  as well.  Patient had made multiple phone calls to various officials to try to voice his concern.  Patient also voiced that he had talked to attorneys who talked about potential legal remedies and aspects of his injury.  Patient was oriented x 4 but remained rather fixated.  He was impulsive with regard to getting up and had to be verbally redirected at the end of our visit to not stand up and ambulate independently and that he voiced understanding but still appeared quite impulsive.  The patient did not display cognitive deficits and his cognition appeared congruent with age.  Other than being rather impulsive and fixated on his fall and subsequent injuries the patient appeared to be competent and oriented.  Patient denied significant emotional distress regarding his injuries but did verbalize the significant concern he has for the length of recovery he will have as he had been active and was in fact playing golf the day he had his fall.  HPI for the current admission:    HPI: Marco Acevedo is an 88 year old male who presented to the [facility] ED on 08/10/2022 after a fall when he missed stepped on a stair and fell to his left side, Complained of severe left buttock pain and was unable to get up. X-rays showed a left hip fx and orthopedic surgery was consulted. He lives at home with his wife and does not use any assistive devices to ambulate. Trauma scan head and neck CT negative, left elbow negative for  fracture or dislocation, positive left intertrochanter fracture. Cardiology consulted prior to surgical repair. 2D echo updated. Echocardiogram revealed mild to moderate aortic stenosis and regurgitation, normal ejection fraction and grade 1 diastolic dysfunction. Underwent IM nail/ORIF by Dr. Blanchie Dessert on 5/03. He is WBAT LLE. CT pelvis performed. No acute pelvic fracture. Tolerating diet. The patient requires inpatient medicine and rehabilitation evaluations and services for ongoing dysfunction secondary to left  hip fracture.   Medical History:   Past Medical History:  Diagnosis Date   Anemia    low iron   Aortic stenosis    Arthritis    BPH (benign prostatic hyperplasia)    CAD (coronary artery disease)    Chronic diastolic CHF (congestive heart failure) (HCC)    CKD (chronic kidney disease), stage IV (HCC)    Claustrophobia    Complication of anesthesia    has to have head elevated to keep from strangling on post nasal drip   COPD (chronic obstructive pulmonary disease) (HCC)    Diabetes mellitus type II    type 2   GERD (gastroesophageal reflux disease)    Gout    Granuloma annulare    History of hiatal hernia    History of kidney stones    Litthotrispy   Hyperlipidemia    Hypertension    Neuropathy    Obesity    OSA (obstructive sleep apnea)    Stroke (HCC) 10/2017   Weakness right arm and leg         Patient Active Problem List   Diagnosis Date Noted   Coping style affecting medical condition 08/29/2022   ABLA (acute blood loss anemia) 08/16/2022   Other fracture of left femur, initial encounter for closed fracture (HCC) 08/16/2022   Left displaced femoral neck fracture (HCC) 08/10/2022   CAD S/P percutaneous coronary angioplasty 08/10/2022   Hip fracture (HCC) 08/10/2022   Abnormal involuntary movements 09/01/2020   After-cataract with vision obscured 09/01/2020   Anemia 09/01/2020   Benign neoplasm of colon 09/01/2020   Benign prostatic hyperplasia without lower urinary tract symptoms 09/01/2020   Calculus of kidney 09/01/2020   Carotid artery disease (HCC) 09/01/2020   Carpal tunnel syndrome 09/01/2020   Change in bowel habit 09/01/2020   Chronic ischemic heart disease 09/01/2020   Constipation 09/01/2020   Diverticulitis 09/01/2020   Elevated prostate specific antigen (PSA) 09/01/2020   Epigastric pain 09/01/2020   Gastro-esophageal reflux disease without esophagitis 09/01/2020   Gout 09/01/2020   Hemiplegia as late effect of cerebrovascular disease (HCC)  09/01/2020   History of other malignant neoplasm of skin 09/01/2020   Hyperpotassemia 09/01/2020   Left inguinal pain 09/01/2020   Mild nonproliferative diabetic retinopathy (HCC) 09/01/2020   Other B-complex deficiencies 09/01/2020   Other long term (current) drug therapy 09/01/2020   Encounter for immunization 09/01/2020   Pain in joint, shoulder region 09/01/2020   Peripheral neuropathy 09/01/2020   Refractive amblyopia 09/01/2020   Sinus bradycardia 09/01/2020   Personal history of tobacco use, presenting hazards to health 09/01/2020   Tremor 09/01/2020   Diabetic peripheral neuropathy associated with type 2 diabetes mellitus (HCC) 09/01/2020   DMII (diabetes mellitus, type 2) (HCC) 09/01/2020   Pain due to onychomycosis of toenail of left foot 06/04/2020   Unstable angina (HCC) 02/24/2019   Carotid artery stenosis, symptomatic, left 10/19/2017   Right sided weakness    Epigastric abdominal tenderness 03/22/2016   Hx of colonic polyps 03/22/2016   Status post surgery 09/23/2014   Gallstones 09/23/2014   Parotid  mass    Essential hypertension 05/29/2013   CKD (chronic kidney disease) stage 4, GFR 15-29 ml/min (HCC) 09/13/2012    Class: Chronic   Coronary artery disease involving native coronary artery of native heart with angina pectoris (HCC) 09/12/2012    Class: Acute   Pure hypercholesterolemia 09/01/2012   URI (upper respiratory infection) 03/05/2012   COPD (chronic obstructive pulmonary disease) (HCC) 05/11/2011   OSA (obstructive sleep apnea) 05/11/2011    Behavioral Observation/Mental Status:   Marco Acevedo  presents as a 88 y.o.-year-old Right handed Caucasian Male who appeared his stated age. his dress was Appropriate and he was Well Groomed and his manners were Appropriate to the situation.  his participation was indicative of Appropriate and Redirectable behaviors.  There were physical disabilities noted.  he displayed an appropriate level of cooperation and  motivation.    Interactions:    Active Appropriate  Attention:   within normal limits and attention span and concentration were age appropriate  Memory:   within normal limits; recent and remote memory intact  Visuo-spatial:   not examined  Speech (Volume):  normal  Speech:   normal; normal  Thought Process:  Tangential  Focused, Linear, and Preoccupied  Though Content:  Rumination; not suicidal and not homicidal  Orientation:   person, place, time/date, and situation  Judgment:   Fair  Planning:   Fair  Affect:    Irritable  Mood:    Dysphoric  Insight:   Fair  Intelligence:   normal  Psychiatric History:  No past psychiatric history noted.  Abuse/Trauma History: Patient recently had a traumatic fall with fractured left hip.  Family Med/Psych History:  Family History  Problem Relation Age of Onset   Aortic aneurysm Father    Aneurysm Mother        brain   Cancer Brother    COPD Sister    Other Sister        health hx unknown   Other Sister        health hx unknown   Other Brother        health hx unknown     Impression/DX:   Marco Acevedo is an 88 year old male referred for neuropsychological consultation during his ongoing inpatient admission onto the comprehensive inpatient rehabilitation unit.  Patient had presented to [facility] emergency department on 08/10/2022 after a fall when he missed a step on a stair and fell onto his left side.  The patient reports that he was at a local golf [place] going into the clubhouse to get some water and upon leaving the area to get water there was a steep step he was attempting to descend that triggered a fall onto his left side.  Patient immediately complained of severe left buttock pain and was unable to get up.  X-rays at the emergency department showed left hip fracture with orthopedic consultation conducted.  Trauma scans of head and neck CT were negative and left elbow was negative etc.  Patient  underwent IM nail/ORIF orthopedic interventions.  Patient is now on the comprehensive inpatient rehab unit requiring rehabilitation services for ongoing dysfunction secondary to left hip fracture.  During the clinical visit today the patient was oriented and aware of his status and current admission onto the inpatient rehab unit.  Patient was very fixated on his injury and how it occurred staying rather focused on the step that he fell going down and responsibility for this obstacle being very dangerous and that other  people are following as well.  Patient had made multiple phone calls to various officials to try to voice his concern.  Patient also voiced that he had talked to attorneys who talked about potential legal remedies and aspects of his injury.  Patient was oriented x 4 but remained rather fixated.  He was impulsive with regard to getting up and had to be verbally redirected at the end of our visit to not stand up and ambulate independently and that he voiced understanding but still appeared quite impulsive.  The patient did not display cognitive deficits and his cognition appeared congruent with age.  Other than being rather impulsive and fixated on his fall and subsequent injuries the patient appeared to be competent and oriented.  Patient denied significant emotional distress regarding his injuries but did verbalize the significant concern he has for the length of recovery he will have as he had been active and was in fact playing golf the day he had his fall.   Disposition/Plan:  Today we worked on coping and adjustment issues with his current physical limitations and weightbearing restrictions.  Major issue was regarding his impulsiveness and vulnerability when he attempts to ambulate without someone there for safety.  Diagnosis:    Coping style affecting his medical condition.         Electronically Signed   _______________________ Arley Phenix, Psy.D. Clinical  Neuropsychologist

## 2022-08-29 NOTE — Progress Notes (Signed)
PROGRESS NOTE   Subjective/Complaints:   Feels like breathing better during day, but dropped to 84% last night when laying down.  Doesn't want to go home with O2.  No more pain in chest with strenuous activity.   SO many Bms in last 24 hours that hemorrhoids are bleeding.    ROS:   Pt denies: (+) intermittent  SOB, abd pain, CP, N/V/C/ (+)D, and vision changes   Except for HPI  Objective:   DG Chest 2 View  Result Date: 08/28/2022 CLINICAL DATA:  Shortness of breath. EXAM: CHEST - 2 VIEW COMPARISON:  08/25/2022 FINDINGS: Cardiopericardial silhouette is at upper limits of normal for size. Right lung clear. Retrocardiac left base atelectasis or infiltrate noted. No pleural effusion. No acute bony abnormality. IMPRESSION: Retrocardiac left base atelectasis or infiltrate. Electronically Signed   By: Kennith Center M.D.   On: 08/28/2022 12:11   Recent Labs    08/28/22 0532  WBC 7.8  HGB 9.7*  HCT 30.7*  PLT 306     Recent Labs    08/28/22 0532  NA 136  K 4.0  CL 101  CO2 22  GLUCOSE 138*  BUN 47*  CREATININE 2.53*  CALCIUM 8.7*      Intake/Output Summary (Last 24 hours) at 08/29/2022 0957 Last data filed at 08/29/2022 0745 Gross per 24 hour  Intake 716 ml  Output 600 ml  Net 116 ml        Physical Exam: Vital Signs Blood pressure 135/72, pulse 74, temperature 98.2 F (36.8 C), resp. rate 18, height 5\' 8"  (1.727 m), weight 85 kg, SpO2 94 %.          General: awake, alert, appropriate, SITTING UP IN BEDSIDE CHAIR; NAD HENT: conjugate gaze; oropharynx moist CV: regular rate AND RHYTHM; no JVD Pulmonary: CTA B/L; no W/R/R- good air movement- ON ra NOW-  GI: soft, NT, ND, (+)BS- slightly hyperactive Psychiatric: appropriate- more interactive today Neurological: Ox3  MS: swelling around L hip incision- like a fluid bump- 1 inch protuberance for ~ 2-2.5 inches downwards- less swollen Skin: L  hip dressing in place- different dressing- C/D/I Extremities: no LE edema seen Skin: Warm and dry, dressing on L lateral hip MS: LLE strength 2-/5 in L HF, KE, DF and PF at 4-4+/5- limited by pain  Assessment/Plan: 1. Functional deficits which require 3+ hours per day of interdisciplinary therapy in a comprehensive inpatient rehab setting. Physiatrist is providing close team supervision and 24 hour management of active medical problems listed below. Physiatrist and rehab team continue to assess barriers to discharge/monitor patient progress toward functional and medical goals  Care Tool:  Bathing    Body parts bathed by patient: Right arm, Chest, Left arm, Abdomen, Front perineal area, Left upper leg, Right upper leg, Face, Left lower leg, Right lower leg   Body parts bathed by helper: Buttocks     Bathing assist Assist Level: Minimal Assistance - Patient > 75%     Upper Body Dressing/Undressing Upper body dressing   What is the patient wearing?: Pull over shirt    Upper body assist Assist Level: Independent with assistive device    Lower Body Dressing/Undressing Lower  body dressing      What is the patient wearing?: Underwear/pull up, Pants     Lower body assist Assist for lower body dressing: Minimal Assistance - Patient > 75%     Toileting Toileting    Toileting assist Assist for toileting: Contact Guard/Touching assist     Transfers Chair/bed transfer  Transfers assist     Chair/bed transfer assist level: Minimal Assistance - Patient > 75%     Locomotion Ambulation   Ambulation assist      Assist level: Contact Guard/Touching assist Assistive device: Walker-rolling Max distance: 25   Walk 10 feet activity   Assist     Assist level: Contact Guard/Touching assist Assistive device: Walker-rolling   Walk 50 feet activity   Assist Walk 50 feet with 2 turns activity did not occur: Safety/medical concerns (pain, weakness)  Assist level:  Contact Guard/Touching assist Assistive device: Walker-rolling    Walk 150 feet activity   Assist Walk 150 feet activity did not occur: Safety/medical concerns         Walk 10 feet on uneven surface  activity   Assist     Assist level: Contact Guard/Touching assist     Wheelchair     Assist Is the patient using a wheelchair?: Yes Type of Wheelchair: Manual    Wheelchair assist level: Supervision/Verbal cueing Max wheelchair distance: 150 ft    Wheelchair 50 feet with 2 turns activity    Assist        Assist Level: Supervision/Verbal cueing   Wheelchair 150 feet activity     Assist      Assist Level: Supervision/Verbal cueing   Blood pressure 135/72, pulse 74, temperature 98.2 F (36.8 C), resp. rate 18, height 5\' 8"  (1.727 m), weight 85 kg, SpO2 94 %.  Medical Problem List and Plan: 1. Functional deficits secondary to left intertrochanteric femur fracture S/P IM nail and ORIF- is WBAT             -patient may shower             -ELOS/Goals: 7 days modI             d/c 5/23  L hip xray just normal swelling- hardware looks OK  Advised pt to not get OOB without staff  Con't CIR PT and OT  VQ scan today- pending results. O2 sats drops, limits intermittently.    2.  Antithrombotics: -DVT/anticoagulation:  Pharmaceutical: Lovenox 30 mg X 4 weeks             -antiplatelet therapy: aspirin 81 mg daily   3. Pain Management: Tylenol as needed             -continue gabapentin 300 mg BID             -continue Lidoderm daily   5/9- switched gabapentin to 600 mg QHS since pt only took 300 mg QHS at home- also on Oxy 5-10 mg q4 hours prn- asked pt to take prior to AM therapy.   5/10-reports pain controlled overall, continue current regimen  5/16- having more sacral pain- L lateral- will have pt try Lidoderm patches he already has ordered, but refused because thought were Salonpas 5/17- Lidoderm patches were helpful- just having L groin pain- xray  shows intact hardware- swelling improving Encouraged pt to take pain meds in Am and lunchtime for L groin pain. Explained it's from L hip 5/18- having improved ROM/movement in L hip/LLE- con't regimen 5/20 Pt reports pain controlled, has  not had prn hydrocodone today 5/21- pain much better controlled 4. Mood/Behavior/Sleep: LCSW to evaluate and provide emotional support             -antipsychotic agents: n/a   5. Neuropsych/cognition: This patient is capable of making decisions on his own behalf.   6. Skin/Wound Care: Routine skin care checks   5/9- Dressing in place- con't dressings 7. Fluids/Electrolytes/Nutrition: Routine Is and Os and follow-up chemistries             -carb modified diet   8: Hypertension: monitor TID and prn             -continue Norvasc 5  mg daily             -continue Coreg 3.125 mg BID             -continue Imdur 120 mg daily   5/9- BP labile- 110s-180s in last 24 hours- will monitor for trend  -5/10 BP controlled overall, continue current regimen however will schedule Coreg earlier in the morning per patient's request  5/19- BP's 100s to 150s- somewhat labile- could be pain related? Con't to monitor trend  5/20 Bps controlled, a little softer this am, continue to monitor for now and continue current regimen  5/21- BP controlled- not soft today- con't regimen    08/29/2022    8:52 AM 08/29/2022    5:42 AM 08/29/2022    5:00 AM  Vitals with BMI  Weight   187 lbs 6 oz  BMI   28.5  Systolic  135   Diastolic  72   Pulse 74 74     9: Hyperlipidemia: continue statin     10: BPH/urinary frequency/retention: continue Proscar   11: History of gout: continue allopurinol   12: Left hip fracture s/p IM nail 5/03 Dr. Blanchie Dessert             -WBAT LLE             -change dressing as needed for soiling/if wet   5.13- looks good- asked nursing to change, since folding up constantly.  13: CAD: s/p stenting X 2; stable. On statin, aspirin and BB. Restart Plavix    14: CKD stage IIIb>>IV: baseline Cr ~2.5 (home on Farxiga)             -follow-up BMP   5/9- Will restart Farxiga-   5/13- Cr up slightly to 2.71 and BUN 57- will recheck Wednesday and if not better, will need to give some fluids. 5/15- Cr down to 2.54 from 2.71 and BUN slightly better at 52 from 57- will con't to have pt push fluids and recheck next Monday.  5/20- Cr and BUN stable today at 2.52/47, continue to encourage fluids  15: COPD: stable             -continue albuterol inhaler prn phlegm   16: GERD/HH: continue Protonix and Carafate   17: OSA: not using CPAP (he is not clear regarding results of sleep study)   18: DM-2: CBGs QID and SSI (home on glipizide and Farxiga)  A1c 6.5%   5/9- will not restart Glipizide Cr is 2.42- will restart Farxiga 10 mg daily.   -5/10 CBGs a little improved, continue to monitor with restart of farxiga  5/13- CBG's 152-179 in last 24 hours- improved- will NOT restart glipizide  5/21- CBGs controlled- con't regimen  CBG (last 3)  Recent Labs    08/28/22 1641 08/28/22 2120 08/29/22 0543  GLUCAP  134* 127* 133*    19: Chronic diastolic CHF: continue meds as above             -daily weight   5/9- Weight 88.2 kg today  5/10 wt stable,continue to follow  5/11: weight reviewed and has decreased  5/13- Weight up slightly 0.6 kg- will monitor  5/14- weight up to 90.8 kg, however no LE edema and hasn't had BM in 2 days. Will monitor trend  5/16- Weight 87.6 kg- down slightly.   5/17- Weight 86.2 kg  5/18- weight 87.6 kg- if goes up again, will give Lasix   5/19- no weight today- per nursing, working on it.   5/20 wt a little down today, continue to monitor Filed Weights   08/26/22 0302 08/28/22 0500 08/29/22 0500  Weight: 87.6 kg 83.1 kg 85 kg    20: ABLA, chronic anemia: follow-up CBC             -restart PO iron   5/9- Hb up to 10- con't to monitor  5/13- Hb slightly decreased to 9.7  5/20 Hb stable at 9.7 21: Pruritus/rash on back:  will order Sarna              22: Tremors: continue primidone   23. Suboptimal vitamin D: start ergocalciferol 50,000U once per week   23. Hypotension: currently hypotensive, will d/c amlodipine  24. Loose stools/cosntipation discussed that these have improved  5/14- Now feels constipated- LBM 2 days ago- will give low dose Sorbitol 15cc at 11am, since doesn't want to have BM on night shift- also, was on Dulcolax tab vs Supp at home daily- will see if needs to add something. Will add Colace 1 tab daily since stool hard and Dulcolax 5 mg daily tablet.   5/15- pt said had "blow out"- after sorbitol- will decrease miralax to 17 G daily as well as stop Colace- since he has Dulcolax tab- pt insistent he only needs Dulcolax, but explained on opiates which constipated pt- he needs more meds.   5/19- LBM yesterday  5/21- wants laxatives reduced- stopped Senna and  changed Miralax to QOD.  25. Indigestion: may overlap with chronic angina, discussed that his outpatient cardiologist prescribed nitroglycerin and this gives him relief, ordered here. 5/18- takes NTG for GERD   26. Shortness of breath: Albuterol ordered  5/18- added albuterol nebs scheduled for 1 day  5/19- back to prn today, but advised pt to ask for them 27. Upper airway congestion: incentive spirometer ordered  28. Desaturation to 82 percent: CXR ordered   5/13- CXR (-) for anything acute  5/18-5/19 per #31 29. Urinary frequency and urgency  5/13- U/A (-) except for high glucose level- has order for timed voiding- will con't 5/16- Purewick at night- pt insistent since was tried. . 5/17- pt insistent that nursing needs to come immediately when has urge to void-  upset that nursing not here during report this AM- wants [place] to leave RW where he can get up on his own- explained we cannot do that due to fall risk.  30. Anal hemorrhoids  5/13- add Anusol suppository per pt request.   5/15- says is going a little better  5/21- doing  worse- bleeding- using suppository- will decrease Miralax to QOD and sotp Senna 31. Hypoxia/Drop in O2 sats  5/18- drop in O2 sats overnight- will schedule Albuterol nebs for 24 hours- wait on giving Lasix since -pt doesn't want AND CXR (-) for vascular congestion; will restart Guafenisin BID  as well and check CT chest with contrast- is pending  5/19- VQ scan due to be done today, hopefully-  changed ot Lovenox 1mg /kg so can cover if has actually had PE- d dimer won't be helpful due to inflammatory changes/hip fx, most likely. Also, his O2 sats are variable- sometimes low in 80s and sometimes normal- CXR was negative Friday night- when these things were occurring. Gave albuterol nebs- were somewhat helpful per pt- educated him to ask for them when SOB- wasn't wearing O2 this Am, and denied SOB.   5/20 pt reports breathing is improved today, Clinical appears improved, CXR with retrocardiac left base atelectasis or infiltrate. O2 sat last was 97 on RA, VQ scan tomorrow.   5/21- per pt and nursing , had O2 sats of 84% last night, but not in flowsheet this way- his O2 sats on RA usually ~94%, but were 97% up until last Saturday.    I spent a total of 59   minutes on total care today- >50% coordination of care- due to trying to get VQ scan done; and d/w team also educating pt why I felt it as necessary to get, so can determine if needs Elqiuis to go home with.   LOS: 13 days A FACE TO FACE EVALUATION WAS PERFORMED  Kent Braunschweig 08/29/2022, 9:57 AM

## 2022-08-30 DIAGNOSIS — S728X2A Other fracture of left femur, initial encounter for closed fracture: Secondary | ICD-10-CM | POA: Diagnosis not present

## 2022-08-30 LAB — GLUCOSE, CAPILLARY
Glucose-Capillary: 118 mg/dL — ABNORMAL HIGH (ref 70–99)
Glucose-Capillary: 183 mg/dL — ABNORMAL HIGH (ref 70–99)
Glucose-Capillary: 112 mg/dL — ABNORMAL HIGH (ref 70–99)
Glucose-Capillary: 130 mg/dL — ABNORMAL HIGH (ref 70–99)

## 2022-08-30 NOTE — Progress Notes (Signed)
PROGRESS NOTE   Subjective/Complaints:   Pt reports stools daily, but better  Got w/c ordered Things going more smoothly  No SOB/DOE in last 24 hours.   Said wife has UTI on 6E for UTI/sepsis.  Doesn't know when coming home.    ROS:   Pt denies SOB, abd pain, CP, N/V/C/D, and vision changes  Except for HPI  Objective:   NM Pulmonary Perfusion  Result Date: 08/29/2022 CLINICAL DATA:  Shortness of breath when lying down EXAM: NUCLEAR MEDICINE PERFUSION LUNG SCAN TECHNIQUE: Perfusion images were obtained in multiple projections after intravenous injection of radiopharmaceutical. Ventilation scans intentionally deferred if perfusion scan and chest x-ray adequate for interpretation during COVID 19 epidemic. RADIOPHARMACEUTICALS:  3.8 mCi Tc-26m MAA IV COMPARISON:  Chest x-ray 08/28/2022 and older FINDINGS: Few small subsegmental peripheral defects identified. Chest x-ray has some minimal left basilar opacity. IMPRESSION: Very low probability perfusion lung scan. Electronically Signed   By: Karen Kays M.D.   On: 08/29/2022 10:54   DG Chest 2 View  Result Date: 08/28/2022 CLINICAL DATA:  Shortness of breath. EXAM: CHEST - 2 VIEW COMPARISON:  08/25/2022 FINDINGS: Cardiopericardial silhouette is at upper limits of normal for size. Right lung clear. Retrocardiac left base atelectasis or infiltrate noted. No pleural effusion. No acute bony abnormality. IMPRESSION: Retrocardiac left base atelectasis or infiltrate. Electronically Signed   By: Kennith Center M.D.   On: 08/28/2022 12:11   Recent Labs    08/28/22 0532  WBC 7.8  HGB 9.7*  HCT 30.7*  PLT 306     Recent Labs    08/28/22 0532  NA 136  K 4.0  CL 101  CO2 22  GLUCOSE 138*  BUN 47*  CREATININE 2.53*  CALCIUM 8.7*      Intake/Output Summary (Last 24 hours) at 08/30/2022 1051 Last data filed at 08/30/2022 0800 Gross per 24 hour  Intake 413 ml  Output 1000 ml   Net -587 ml        Physical Exam: Vital Signs Blood pressure 137/61, pulse 69, temperature 98.4 F (36.9 C), temperature source Oral, resp. rate 18, height 5\' 8"  (1.727 m), weight 84.4 kg, SpO2 93 %.           General: awake, alert, appropriate, sitting up in bed; NAD HENT: conjugate gaze; oropharynx moist CV: regular rate and rhythm; no JVD Pulmonary: CTA B/L; no W/R/R- good air movement- sounds great GI: soft, NT, ND, (+)BS Psychiatric: appropriate- brighter affect Neurological: Ox3 Purewick still in place this AM MS: swelling around L hip incision- like a fluid bump- 1 inch protuberance for ~ 2-2.5 inches downwards- less swollen Skin: L hip dressing in place- different dressing- C/D/I Extremities: no LE edema seen Skin: Warm and dry, dressing on L lateral hip MS: LLE strength 2-/5 in L HF, KE, DF and PF at 4-4+/5- limited by pain  Assessment/Plan: 1. Functional deficits which require 3+ hours per day of interdisciplinary therapy in a comprehensive inpatient rehab setting. Physiatrist is providing close team supervision and 24 hour management of active medical problems listed below. Physiatrist and rehab team continue to assess barriers to discharge/monitor patient progress toward functional and medical goals  Care  Tool:  Bathing    Body parts bathed by patient: Right arm, Chest, Left arm, Abdomen, Front perineal area, Left upper leg, Right upper leg, Face, Left lower leg, Right lower leg, Buttocks   Body parts bathed by helper: Buttocks     Bathing assist Assist Level: Independent with assistive device     Upper Body Dressing/Undressing Upper body dressing   What is the patient wearing?: Pull over shirt    Upper body assist Assist Level: Independent with assistive device    Lower Body Dressing/Undressing Lower body dressing      What is the patient wearing?: Underwear/pull up, Pants     Lower body assist Assist for lower body dressing: Independent  with assitive device     Toileting Toileting    Toileting assist Assist for toileting: Independent with assistive device     Transfers Chair/bed transfer  Transfers assist     Chair/bed transfer assist level: Independent with assistive device Chair/bed transfer assistive device: Arboriculturist assist      Assist level: Contact Guard/Touching assist Assistive device: Walker-rolling Max distance: 25   Walk 10 feet activity   Assist     Assist level: Contact Guard/Touching assist Assistive device: Walker-rolling   Walk 50 feet activity   Assist Walk 50 feet with 2 turns activity did not occur: Safety/medical concerns (pain, weakness)  Assist level: Contact Guard/Touching assist Assistive device: Walker-rolling    Walk 150 feet activity   Assist Walk 150 feet activity did not occur: Safety/medical concerns         Walk 10 feet on uneven surface  activity   Assist     Assist level: Contact Guard/Touching assist     Wheelchair     Assist Is the patient using a wheelchair?: Yes Type of Wheelchair: Manual    Wheelchair assist level: Supervision/Verbal cueing Max wheelchair distance: 150 ft    Wheelchair 50 feet with 2 turns activity    Assist        Assist Level: Supervision/Verbal cueing   Wheelchair 150 feet activity     Assist      Assist Level: Supervision/Verbal cueing   Blood pressure 137/61, pulse 69, temperature 98.4 F (36.9 C), temperature source Oral, resp. rate 18, height 5\' 8"  (1.727 m), weight 84.4 kg, SpO2 93 %.  Medical Problem List and Plan: 1. Functional deficits secondary to left intertrochanteric femur fracture S/P IM nail and ORIF- is WBAT             -patient may shower             -ELOS/Goals: 7 days modI             d/c 5/23  L hip xray just normal swelling- hardware looks OK  Advised pt to not get OOB without staff  D/c 5/23  Con't CIR PT and OT 2.   Antithrombotics: -DVT/anticoagulation:  Pharmaceutical: Lovenox 30 mg X 4 weeks             -antiplatelet therapy: aspirin 81 mg daily   3. Pain Management: Tylenol as needed             -continue gabapentin 300 mg BID             -continue Lidoderm daily   5/9- switched gabapentin to 600 mg QHS since pt only took 300 mg QHS at home- also on Oxy 5-10 mg q4 hours prn- asked pt to take prior to  AM therapy.   5/10-reports pain controlled overall, continue current regimen  5/16- having more sacral pain- L lateral- will have pt try Lidoderm patches he already has ordered, but refused because thought were Salonpas 5/17- Lidoderm patches were helpful- just having L groin pain- xray shows intact hardware- swelling improving Encouraged pt to take pain meds in Am and lunchtime for L groin pain. Explained it's from L hip 5/18- having improved ROM/movement in L hip/LLE- con't regimen 5/20 Pt reports pain controlled, has not had prn hydrocodone today 5/21-5/22 pain much better controlled 4. Mood/Behavior/Sleep: LCSW to evaluate and provide emotional support             -antipsychotic agents: n/a   5. Neuropsych/cognition: This patient is capable of making decisions on his own behalf.   6. Skin/Wound Care: Routine skin care checks   5/9- Dressing in place- con't dressings 7. Fluids/Electrolytes/Nutrition: Routine Is and Os and follow-up chemistries             -carb modified diet   8: Hypertension: monitor TID and prn             -continue Norvasc 5  mg daily             -continue Coreg 3.125 mg BID             -continue Imdur 120 mg daily   5/9- BP labile- 110s-180s in last 24 hours- will monitor for trend  -5/10 BP controlled overall, continue current regimen however will schedule Coreg earlier in the morning per patient's request  5/19- BP's 100s to 150s- somewhat labile- could be pain related? Con't to monitor trend  5/20 Bps controlled, a little softer this am, continue to monitor for now and  continue current regimen  5/21-5/22 BP controlled- not soft today- con't regimen    08/30/2022    6:09 AM 08/30/2022    5:00 AM 08/29/2022    8:01 PM  Vitals with BMI  Weight  186 lbs 1 oz   BMI  28.3   Systolic 137  96  Diastolic 61  56  Pulse 69  59    9: Hyperlipidemia: continue statin     10: BPH/urinary frequency/retention: continue Proscar   11: History of gout: continue allopurinol   12: Left hip fracture s/p IM nail 5/03 Dr. Blanchie Dessert             -WBAT LLE             -change dressing as needed for soiling/if wet   5.13- looks good- asked nursing to change, since folding up constantly.  13: CAD: s/p stenting X 2; stable. On statin, aspirin and BB. Restart Plavix   14: CKD stage IIIb>>IV: baseline Cr ~2.5 (home on Farxiga)             -follow-up BMP   5/9- Will restart Farxiga-   5/13- Cr up slightly to 2.71 and BUN 57- will recheck Wednesday and if not better, will need to give some fluids. 5/15- Cr down to 2.54 from 2.71 and BUN slightly better at 52 from 57- will con't to have pt push fluids and recheck next Monday.  5/20- Cr and BUN stable today at 2.52/47, continue to encourage fluids  15: COPD: stable             -continue albuterol inhaler prn phlegm   16: GERD/HH: continue Protonix and Carafate   17: OSA: not using CPAP (he is not clear regarding results of  sleep study)   18: DM-2: CBGs QID and SSI (home on glipizide and Farxiga)  A1c 6.5%   5/9- will not restart Glipizide Cr is 2.42- will restart Farxiga 10 mg daily.   -5/10 CBGs a little improved, continue to monitor with restart of farxiga  5/13- CBG's 152-179 in last 24 hours- improved- will NOT restart glipizide  5/21- 5/22CBGs controlled- con't regimen  CBG (last 3)  Recent Labs    08/29/22 1733 08/29/22 2205 08/30/22 0641  GLUCAP 127* 145* 118*    19: Chronic diastolic CHF: continue meds as above             -daily weight   5/9- Weight 88.2 kg today  5/10 wt stable,continue to  follow  5/11: weight reviewed and has decreased  5/13- Weight up slightly 0.6 kg- will monitor  5/14- weight up to 90.8 kg, however no LE edema and hasn't had BM in 2 days. Will monitor trend  5/16- Weight 87.6 kg- down slightly.   5/17- Weight 86.2 kg  5/18- weight 87.6 kg- if goes up again, will give Lasix   5/19- no weight today- per nursing, working on it.   5/20 wt a little down today, continue to monitor  5/22- Weight down for yesterday- con't to monitor Filed Weights   08/28/22 0500 08/29/22 0500 08/30/22 0500  Weight: 83.1 kg 85 kg 84.4 kg    20: ABLA, chronic anemia: follow-up CBC             -restart PO iron   5/9- Hb up to 10- con't to monitor  5/13- Hb slightly decreased to 9.7  5/20 Hb stable at 9.7 21: Pruritus/rash on back: will order Sarna              22: Tremors: continue primidone   23. Suboptimal vitamin D: start ergocalciferol 50,000U once per week   23. Hypotension: currently hypotensive, will d/c amlodipine  24. Loose stools/cosntipation discussed that these have improved  5/14- Now feels constipated- LBM 2 days ago- will give low dose Sorbitol 15cc at 11am, since doesn't want to have BM on night shift- also, was on Dulcolax tab vs Supp at home daily- will see if needs to add something. Will add Colace 1 tab daily since stool hard and Dulcolax 5 mg daily tablet.   5/15- pt said had "blow out"- after sorbitol- will decrease miralax to 17 G daily as well as stop Colace- since he has Dulcolax tab- pt insistent he only needs Dulcolax, but explained on opiates which constipated pt- he needs more meds.   5/19- LBM yesterday  5/21- wants laxatives reduced- stopped Senna and  changed Miralax to QOD.   5/22- still going daily 25. Indigestion: may overlap with chronic angina, discussed that his outpatient cardiologist prescribed nitroglycerin and this gives him relief, ordered here. 5/18- takes NTG for GERD   26. Shortness of breath: Albuterol ordered  5/18- added  albuterol nebs scheduled for 1 day  5/19- back to prn today, but advised pt to ask for them  5/22- breathing much bette-r back to baseline 27. Upper airway congestion: incentive spirometer ordered  28. Desaturation to 82 percent: CXR ordered   5/13- CXR (-) for anything acute  5/18-5/19 per #31 29. Urinary frequency and urgency  5/13- U/A (-) except for high glucose level- has order for timed voiding- will con't 5/16- Purewick at night- pt insistent since was tried. . 5/17- pt insistent that nursing needs to come immediately when has urge  to void-  upset that nursing not here during report this AM- wants [place] to leave RW where he can get up on his own- explained we cannot do that due to fall risk.  30. Anal hemorrhoids  5/13- add Anusol suppository per pt request.   5/15- says is going a little better  5/21- doing worse- bleeding- using suppository- will decrease Miralax to QOD and sotp Senna 31. Hypoxia/Drop in O2 sats  5/18- drop in O2 sats overnight- will schedule Albuterol nebs for 24 hours- wait on giving Lasix since -pt doesn't want AND CXR (-) for vascular congestion; will restart Guafenisin BID as well and check CT chest with contrast- is pending  5/19- VQ scan due to be done today, hopefully-  changed ot Lovenox 1mg /kg so can cover if has actually had PE- d dimer won't be helpful due to inflammatory changes/hip fx, most likely. Also, his O2 sats are variable- sometimes low in 80s and sometimes normal- CXR was negative Friday night- when these things were occurring. Gave albuterol nebs- were somewhat helpful per pt- educated him to ask for them when SOB- wasn't wearing O2 this Am, and denied SOB.   5/20 pt reports breathing is improved today, Clinical appears improved, CXR with retrocardiac left base atelectasis or infiltrate. O2 sat last was 97 on RA, VQ scan tomorrow.   5/21- per pt and nursing , had O2 sats of 84% last night, but not in flowsheet this way- his O2 sats on RA usually ~94%,  but were 97% up until last Saturday.   5/22- O2 sats doing well in last 24 hours-91-96% on RA-educated pt on VQ scan results and that doesn't need to go home on Eliquis/lovenox- decreased lovenox down to prevention dose.     I spent a total of  36  minutes on total care today- >50% coordination of care- due to d/w pt about VQ scan and that doesn't have PE- so doesn't have to go home on Eliquis- also educated on painmeds when goes home- and Off O2- d/w nursing.   LOS: 14 days A FACE TO FACE EVALUATION WAS PERFORMED  Verlon Pischke 08/30/2022, 10:51 AM

## 2022-08-30 NOTE — Progress Notes (Signed)
Physical Therapy Discharge Summary  Patient Details  Name: Marco Acevedo MRN: [id number] Date of Birth: [birth year]  Date of Discharge from PT service:Aug 30, 2022  Today's Date: 08/30/2022 PT Individual Time: 1007-1122 PT Individual Time Calculation (min): 75 min    Patient has met 8 of 9 long term goals due to improved activity tolerance, improved balance, increased strength, increased range of motion, decreased pain, ability to compensate for deficits, and improved coordination.  Patient to discharge at a wheelchair level Modified Independent.     Reasons goals not met: Pt continues to require min A for stairs due to weakness.   Recommendation:  Patient will benefit from ongoing skilled PT services in home health setting to continue to advance safe functional mobility, address ongoing impairments in balance, coordination, strength, and minimize fall risk.  Equipment: Manual w/c, rolling walker   Reasons for discharge: treatment goals met and discharge from hospital  Patient/family agrees with progress made and goals achieved: Yes  PT Discharge Pain Interference Pain Interference Pain Effect on Sleep: 1. Rarely or not at all Pain Interference with Therapy Activities: 2. Occasionally Pain Interference with Day-to-Day Activities: 2. Occasionally Cognition Overall Cognitive Status: Within Functional Limits for tasks assessed Arousal/Alertness: Awake/alert Orientation Level: Oriented X4 Memory: Appears intact Awareness: Appears intact Problem Solving: Appears intact Safety/Judgment: Appears intact Sensation Sensation Light Touch: Impaired by gross assessment Hot/Cold: Appears Intact Proprioception: Appears Intact Stereognosis: Appears Intact Additional Comments: B LE neuropathy Coordination Gross Motor Movements are Fluid and Coordinated: No Fine Motor Movements are Fluid and Coordinated: Yes Coordination and Movement Description: Grossly uncoordinated 2/2 pain and  weakness, improving. Finger Nose Finger Test: [facility] Heel Shin Test: unable to perform L LE, intact R LE Motor  Motor Motor: Other (comment) (WFL on R, limited 2/2 muscle weakness on L) Motor - Skilled Clinical Observations: WFL on R, limited 2/2 muscle weakness on L  Mobility Bed Mobility Bed Mobility: Supine to Sit;Sit to Supine Supine to Sit: Independent with assistive device Sit to Supine: Independent with assistive device Transfers Transfers: Sit to Stand;Stand to Sit Sit to Stand: Independent with assistive device Stand to Sit: Independent with assistive device Transfer (Assistive device): Rolling walker Locomotion  Gait Gait Assistance: Independent with assistive device Gait Distance (Feet): 230 Feet Assistive device: Rolling walker Gait Gait: Yes Gait Pattern: Impaired Gait Pattern: Step-to pattern;Antalgic Gait velocity: decrased Stairs / Additional Locomotion Stairs: Yes Stairs Assistance: Minimal Assistance - Patient > 75% Stair Management Technique: Two rails Number of Stairs: 8 Height of Stairs: 3 Ramp: Independent with assistive device Curb: Minimal Assistance - Patient >75% Pick up small object from the floor assist level: Minimal Assistance - Patient > 75% Pick up small object from the floor assistive device: Chief Operating Officer Mobility: Yes Wheelchair Assistance: Independent with Scientist, research (life sciences): Both upper extremities Wheelchair Parts Management: Needs assistance Distance: 150 ft  Trunk/Postural Assessment  Cervical Assessment Cervical Assessment: Within Functional Limits Thoracic Assessment Thoracic Assessment: Within Functional Limits Lumbar Assessment Lumbar Assessment: Within Functional Limits Postural Control Postural Control: Within Functional Limits  Balance Balance Balance Assessed: Yes Static Sitting Balance Static Sitting - Balance Support: Feet supported Static Sitting - Level of Assistance:  6: Modified independent (Device/Increase time) Dynamic Sitting Balance Dynamic Sitting - Balance Support: During functional activity Dynamic Sitting - Level of Assistance: 6: Modified independent (Device/Increase time) Dynamic Sitting - Balance Activities: Lateral lean/weight shifting;Forward lean/weight shifting;Reaching for objects Static Standing Balance Static Standing - Balance Support: Bilateral upper extremity supported;During functional  activity Static Standing - Level of Assistance: 6: Modified independent (Device/Increase time) Dynamic Standing Balance Dynamic Standing - Balance Support: During functional activity Dynamic Standing - Level of Assistance: 6: Modified independent (Device/Increase time) Dynamic Standing - Balance Activities: Forward lean/weight shifting;Reaching for objects Extremity Assessment  RUE Assessment RUE Assessment: Within Functional Limits LUE Assessment LUE Assessment: Within Functional Limits RLE Assessment RLE Assessment: Within Functional Limits LLE Assessment LLE Assessment: Exceptions to [facility] General Strength Comments: Grossly 2/5  Pt received seated in recliner at bedside, agreeable to PT session. Pt reports 5-6/10 left hip pain, pre-medicated and provided repositioning for relief. Pt Mod I with all mobility in session except stair navigation (min A with 2 HR's). Pt performed ambulatory transfer and transported for time management and energy conservation to ortho gym. Pt Mod I with car transfer, ramp negotiation and gait ~230 ft with RW. Pt navigated 8 steps with 2 HR's with min A and increased time. Pt transported to room and left seated in recliner at bedside with all needs in reach.   Truitt Leep Truitt Leep PT, DPT  08/30/2022, 11:11 AM

## 2022-08-30 NOTE — Plan of Care (Signed)
  Problem: RH Balance Goal: LTG Patient will maintain dynamic standing with ADLs (OT) Description: LTG:  Patient will maintain dynamic standing balance with assist during activities of daily living (OT)  Outcome: Completed/Met   Problem: RH Grooming Goal: LTG Patient will perform grooming w/assist,cues/equip (OT) Description: LTG: Patient will perform grooming with assist, with/without cues using equipment (OT) Outcome: Completed/Met   Problem: RH Bathing Goal: LTG Patient will bathe all body parts with assist levels (OT) Description: LTG: Patient will bathe all body parts with assist levels (OT) Outcome: Completed/Met   Problem: RH Dressing Goal: LTG Patient will perform upper body dressing (OT) Description: LTG Patient will perform upper body dressing with assist, with/without cues (OT). Outcome: Completed/Met Goal: LTG Patient will perform lower body dressing w/assist (OT) Description: LTG: Patient will perform lower body dressing with assist, with/without cues in positioning using equipment (OT) Outcome: Completed/Met   Problem: RH Toileting Goal: LTG Patient will perform toileting task (3/3 steps) with assistance level (OT) Description: LTG: Patient will perform toileting task (3/3 steps) with assistance level (OT)  Outcome: Completed/Met   Problem: RH Simple Meal Prep Goal: LTG Patient will perform simple meal prep w/assist (OT) Description: LTG: Patient will perform simple meal prep with assistance, with/without cues (OT). Outcome: Completed/Met   Problem: RH Toilet Transfers Goal: LTG Patient will perform toilet transfers w/assist (OT) Description: LTG: Patient will perform toilet transfers with assist, with/without cues using equipment (OT) Outcome: Completed/Met   Problem: RH Tub/Shower Transfers Goal: LTG Patient will perform tub/shower transfers w/assist (OT) Description: LTG: Patient will perform tub/shower transfers with assist, with/without cues using equipment  (OT) Outcome: Completed/Met

## 2022-08-30 NOTE — Progress Notes (Signed)
Occupational Therapy Discharge Summary  Patient Details  Name: Marco Acevedo MRN: [id number] Date of Birth: [birth year]  Date of Discharge from OT service:Aug 30, 2022  Today's Date: 08/30/2022 OT Individual Time: 0800-0900 Session 1; [id number] Session 2  OT Individual Time Calculation (min): 60 min, 73 min    Patient has met 9 of 9 long term goals due to improved activity tolerance, improved balance, ability to compensate for deficits, and functional use of  LEFT lower extremity.  Patient to discharge at overall Modified Independent level.  Patient's care partner is independent to provide intermediate setup/supervision at discharge.    Recommendation:  Patient will benefit from ongoing skilled OT services in home health setting to continue to advance functional skills in the area of BADL, iADL, and Reduce care partner burden.  Equipment: 3 in 1 TTB  Reasons for discharge: treatment goals met and discharge from hospital  Patient/family agrees with progress made and goals achieved: Yes  OT Discharge Precautions/Restrictions  Precautions Precautions: Fall Precaution Comments: LLE WBAT Restrictions Weight Bearing Restrictions: Yes LLE Weight Bearing: Weight bearing as tolerated ADL ADL Equipment Provided: Reacher, Sock aid, Long-handled sponge Eating: Independent Where Assessed-Eating: Chair Grooming: Modified independent Where Assessed-Grooming: Sitting at sink Upper Body Bathing: Modified independent Where Assessed-Upper Body Bathing: Shower Lower Body Bathing: Modified independent Where Assessed-Lower Body Bathing: Shower Upper Body Dressing: Modified independent (Device) Where Assessed-Upper Body Dressing: Edge of bed Lower Body Dressing: Modified independent Where Assessed-Lower Body Dressing: Edge of bed Toileting: Modified independent Where Assessed-Toileting: Neurosurgeon Method: Network engineer: Gaffer: Unable to Catering manager Method: Designer, industrial/product: Shower seat without back ADL Comments: D LB with need for LB AE trng, Set up UB self care sink side, RW with min A toilet and stall shower to TTB, pain L hip/LE/groin, unable to test shower stall threshold in apt Vision Baseline Vision/History: 1 Wears glasses Patient Visual Report: No change from baseline Perception  Perception: Within Functional Limits Praxis Praxis: Intact Cognition Cognition Overall Cognitive Status: Within Functional Limits for tasks assessed Arousal/Alertness: Awake/alert Orientation Level: Person;Place;Situation Memory: Appears intact Awareness: Appears intact Problem Solving: Appears intact Safety/Judgment: Appears intact   08/30/22 1539  Brief Interview for Mental Status (BIMS)  Repetition of Three Words (First Attempt) 3  Temporal Orientation: Year Correct  Temporal Orientation: Month Accurate within 5 days  Temporal Orientation: Day Correct  Recall: "Sock" Yes, no cue required  Recall: "Blue" Yes, no cue required  Recall: "Bed" Yes, no cue required  BIMS Summary Score 15   Sensation Sensation Light Touch: Impaired by gross assessment Hot/Cold: Appears Intact Stereognosis: Appears Intact Additional Comments: B LE neuropathy Coordination Gross Motor Movements are Fluid and Coordinated: No Fine Motor Movements are Fluid and Coordinated: Yes Coordination and Movement Description: Grossly uncoordinated 2/2 pain and weakness, improving. Motor  Motor Motor: Other (comment) Motor - Skilled Clinical Observations: WFL on R, limited 2/2 muscle weakness on L Mobility  Bed Mobility Bed Mobility: Supine to Sit;Sit to Supine Supine to Sit: Independent with assistive device Sit to Supine: Independent with assistive device Transfers Sit to Stand: Independent with assistive  device Stand to Sit: Independent with assistive device  Trunk/Postural Assessment  Cervical Assessment Cervical Assessment: Within Functional Limits Thoracic Assessment Thoracic Assessment: Within Functional Limits Lumbar Assessment Lumbar Assessment: Within Functional Limits Postural Control Postural Control: Within Functional Limits  Balance Balance Balance Assessed: Yes Static Sitting Balance  Static Sitting - Balance Support: Feet supported Static Sitting - Level of Assistance: 6: Modified independent (Device/Increase time) Dynamic Sitting Balance Dynamic Sitting - Balance Support: During functional activity Dynamic Sitting - Level of Assistance: 6: Modified independent (Device/Increase time) Dynamic Sitting - Balance Activities: Lateral lean/weight shifting;Forward lean/weight shifting;Reaching for objects Static Standing Balance Static Standing - Balance Support: Bilateral upper extremity supported;During functional activity Static Standing - Level of Assistance: 6: Modified independent (Device/Increase time) Dynamic Standing Balance Dynamic Standing - Balance Support: During functional activity Dynamic Standing - Level of Assistance: 6: Modified independent (Device/Increase time) Dynamic Standing - Balance Activities: Forward lean/weight shifting;Reaching for objects Extremity/Trunk Assessment RUE Assessment RUE Assessment: Within Functional Limits LUE Assessment LUE Assessment: Within Functional Limits  Session 1:  Pt in w/c eating breakfast on RA. Pt reported he remained obove 90% O2 sats all night and is at 92% on RA currently. Full AM dressing routine with AE training and education for energy conservation and task simplification focus. Pt stood for voiding into urinal with mod I with RW support. Doffed shorts and soiled tshirt mod I with reacher. Donned clean socks, pull on pants, tshirt, sweatshirt and diabetic shoes incl laces with and wihtout AE and mod I. Left pt at  end of session recliner level with all safety measures in place.   Pain:  4/10 groin on L LE with meds just taken and rest and repositioning for relief    Session 2:  Completed OT interventions and education this session. Pt able to amb from recliner to w/c for OT to transport off unit to gift shop. Pt able to amb with RW bag and purchase gft for wife reaching from shelf and paying at cashier all with mod I. OT then transported pt to 6E where pt's wife is in hospital. Pt amb with mod I to give wife her gift and sit and visit for a moment sitting in regular arm chair. Son and wife educated on final OT discharge recs. All inagreement. Once back on unit, OT replaced ktape to R shoulder for support in prep for increased mobility upon d/c. Pt a le to transport light laundry items to bags for d/c with mod I. No further OT needs with pt left recliner level with all items, call button and needs in place.   Pain:  1/10 L groin pain with no interference with function   Lou Cal, OTR/L, MSOT  08/30/2022, 7:50 AM

## 2022-08-30 NOTE — Progress Notes (Signed)
Inpatient Rehabilitation Care Coordinator Discharge Note   Patient Details  Name: Marco Acevedo MRN: [id number] Date of Birth: 1934-11-26   Discharge location: HOME WITH WIFE IF SHE IS DISCHARGED FROM THE HOSPITAL-OTHERWISE SON WILL BE THERE  Length of Stay: 15 DAYS  Discharge activity level: SUPERVISION-MOD/I LEVEL  Home/community participation: ACTIVE  Patient response WU:[id number] Literacy - How often do you need to have someone help you when you read instructions, pamphlets, or other written material from your doctor or pharmacy?: Never  Patient response YN:WGNFAO Isolation - How often do you feel lonely or isolated from those around you?: Never  Services provided included: MD, RD, PT, OT, RN, CM, Pharmacy, SW  Financial Services:  Field seismologist Utilized: Dealer  Choices offered to/list presented to: PT AND SON  Follow-up services arranged:  Home Health, DME, Patient/Family has no preference for HH/DME agencies Home Health Agency: [facility]  PT  OT  RN    DME : [facility] 3 IN 1, ROLLATOR  AND TUB SEAT    Patient response to transportation need: Is the patient able to respond to transportation needs?: Yes In the past 12 months, has lack of transportation kept you from medical appointments or from getting medications?: No In the past 12 months, has lack of transportation kept you from meetings, work, or from getting things needed for daily living?: No   Patient/Family verbalized understanding of follow-up arrangements:  Yes  Individual responsible for coordination of the follow-up plan: SELF AND MICHAEL-SON [phone]  Confirmed correct DME delivered: Lucy Chris 08/30/2022    Comments (or additional information): COMPLICATED BY WIFE BEING ADMITTED TO THE HOSPITAL THREE DAYS PRIOR TO PT'S DISCHARGE. SON AND DAUGHTER IN-LAW ARE INVOLVED AND WILL BE CHECKING ON.   Summary of Stay    Date/Time Discharge Planning CSW  08/29/22  1000 Will ask wife to attend therapies with husband this week so can see his progress and care needs. She is unable to physically assist him due to her own health issues. Have ordered DME and will set up [facility] versus OP pt deciding what he wants. RGD  08/22/22 0840 Home with wife who is not able to assist but can provide supervision level, his son and daughter in-law are involved and will be checking in on him. Pt asked for worker to order rollator from [place] has regular rolling walker to use until is safe to use rollator. [facility]. Await other recommnedations RGD       Jadis Pitter, Lemar Livings

## 2022-08-31 ENCOUNTER — Other Ambulatory Visit (HOSPITAL_COMMUNITY): Payer: Self-pay

## 2022-08-31 DIAGNOSIS — S728X2A Other fracture of left femur, initial encounter for closed fracture: Secondary | ICD-10-CM | POA: Diagnosis not present

## 2022-08-31 LAB — GLUCOSE, CAPILLARY: Glucose-Capillary: 123 mg/dL — ABNORMAL HIGH (ref 70–99)

## 2022-08-31 MED ORDER — ACETAMINOPHEN 325 MG PO TABS: 325.0000 mg | ORAL_TABLET | ORAL | Status: DC | PRN

## 2022-08-31 MED ORDER — TRAZODONE HCL 50 MG PO TABS
25.0000 mg | ORAL_TABLET | Freq: Every evening | ORAL | 0 refills | Status: DC | PRN
  Filled 2022-08-31: qty 30, 30d supply, fill #0

## 2022-08-31 MED ORDER — OXYCODONE HCL 5 MG PO TABS
5.0000 mg | ORAL_TABLET | Freq: Four times a day (QID) | ORAL | 0 refills | Status: DC | PRN
  Filled 2022-08-31: qty 15, 4d supply, fill #0

## 2022-08-31 MED ORDER — GABAPENTIN 300 MG PO CAPS
600.0000 mg | ORAL_CAPSULE | Freq: Every day | ORAL | 0 refills | Status: AC
  Filled 2022-08-31: qty 60, 30d supply, fill #0

## 2022-08-31 MED ORDER — POLYETHYLENE GLYCOL 3350 17 G PO PACK: 17.0000 g | PACK | ORAL | 0 refills | Status: DC

## 2022-08-31 NOTE — Progress Notes (Signed)
Inpatient Rehabilitation Discharge Medication Review by a Pharmacist  A complete drug regimen review was completed for this patient to identify any potential clinically significant medication issues.  High Risk Drug Classes Is patient taking? Indication by Medication  Antipsychotic No   Anticoagulant No   Antibiotic No   Opioid Yes Tramadol - moderate pain Oxycodone - severe pain  Antiplatelet Yes Aspirin 81 mg, clopidogrel - CAD, prior cardiac stents  Hypoglycemics/insulin Yes Dapagliflozin - glucose control  Vasoactive Medication Yes Carvedilol, isosorbide mononitrate - blood pressure  Chemotherapy No   Other Yes Allopurinol - gout prophylaxis Atorvastatin - hyperlipidemia Ferrous sulfate, Vitamin D, B-12 - supplements Finasteride - BPH Gabapentin - neuropathic pain Guaifenesin - mucolytic Capsaicin cream - topical pain relief Pantoprazole, sucralfate - GERD and hiatal hernia Miralax - laxative Primidone - essential tremor  PRNs:  Acetaminophen - mild pain Albuterol inhaler - shortness of breath or wheezing Trazodone - sleep     Type of Medication Issue Identified Description of Issue Recommendation(s)  Drug Interaction(s) (clinically significant)     Duplicate Therapy     Allergy     No Medication Administration End Date     Incorrect Dose     Additional Drug Therapy Needed     Significant med changes from prior encounter (inform family/care partners about these prior to discharge). Staying off prior amlodipine, losartan (some hypotension while inpatient). Staying off glipizide (now dapagliflozin). Communicate changes to patient/family.   Other       Clinically significant medication issues were identified that warrant physician communication and completion of prescribed/recommended actions by midnight of the next day:  No  Pharmacist comments:   Time spent performing this drug regimen review (minutes):  20   Dennie Fetters, [place] 08/31/2022 11:27  AM

## 2022-08-31 NOTE — Progress Notes (Signed)
Recreational Therapy Discharge Summary Patient Details  Name: Marco Acevedo MRN: [id number] Date of Birth: 1934-05-07 Today's Date: 08/31/2022  Comments on progress toward goals: Pt has made good progress during LOS and is discharging home today with family to provide/coordinate supervision/assistance.  TR sessions focused on pt education in regards to activity analysis/modifications, and therapeutic activities including pet therapy, activity tolerance, dynamic balance UE exercise.  Pt is very appreciative of the care he as received and states he feels ready to return home.   He is anxious to return to previously enjoyed activities as able.  Reasons for discharge: discharge from hospital  Follow-up: Home Health  Patient/family agrees with progress made and goals achieved: Yes  Ketara Cavness 08/31/2022, 1:18 PM

## 2022-08-31 NOTE — Progress Notes (Signed)
PROGRESS NOTE   Subjective/Complaints:   Pt reports wife going home today as well from hospital.  We discussed d/c meds- not going home on Glipizide due to renal issues.   Also, can take Lasix PRN ROS:   Pt denies SOB, abd pain, CP, N/V/C/D, and vision changes   Except for HPI  Objective:   NM Pulmonary Perfusion  Result Date: 08/29/2022 CLINICAL DATA:  Shortness of breath when lying down EXAM: NUCLEAR MEDICINE PERFUSION LUNG SCAN TECHNIQUE: Perfusion images were obtained in multiple projections after intravenous injection of radiopharmaceutical. Ventilation scans intentionally deferred if perfusion scan and chest x-ray adequate for interpretation during COVID 19 epidemic. RADIOPHARMACEUTICALS:  3.8 mCi Tc-69m MAA IV COMPARISON:  Chest x-ray 08/28/2022 and older FINDINGS: Few small subsegmental peripheral defects identified. Chest x-ray has some minimal left basilar opacity. IMPRESSION: Very low probability perfusion lung scan. Electronically Signed   By: Karen Kays M.D.   On: 08/29/2022 10:54   No results for input(s): "WBC", "HGB", "HCT", "PLT" in the last 72 hours.    No results for input(s): "NA", "K", "CL", "CO2", "GLUCOSE", "BUN", "CREATININE", "CALCIUM" in the last 72 hours.     Intake/Output Summary (Last 24 hours) at 08/31/2022 9562 Last data filed at 08/31/2022 0700 Gross per 24 hour  Intake 440 ml  Output 1500 ml  Net -1060 ml        Physical Exam: Vital Signs Blood pressure 136/60, pulse 70, temperature 99.1 F (37.3 C), temperature source Oral, resp. rate 17, height 5\' 8"  (1.727 m), weight 85 kg, SpO2 93 %.            General: awake, alert, appropriate, sitting in w/c- putting on shirt; nurse in room; NAD HENT: conjugate gaze; oropharynx moist CV: regular rate and rhythm;  no JVD Pulmonary: a little coarse- but sounds good otherwise GI: soft, NT, ND, (+)BS Psychiatric: appropriate-  appreciative of care Neurological: Ox3  MS: swelling around L hip incision- like a fluid bump- 1 inch protuberance for ~ 2-2.5 inches downwards- less swollen Skin: L hip dressing in place- different dressing- C/D/I Extremities: no LE edema seen Skin: Warm and dry, dressing on L lateral hip MS: LLE strength 2-/5 in L HF, KE, DF and PF at 4-4+/5- limited by pain  Assessment/Plan: 1. Functional deficits which require 3+ hours per day of interdisciplinary therapy in a comprehensive inpatient rehab setting. Physiatrist is providing close team supervision and 24 hour management of active medical problems listed below. Physiatrist and rehab team continue to assess barriers to discharge/monitor patient progress toward functional and medical goals  Care Tool:  Bathing    Body parts bathed by patient: Right arm, Chest, Left arm, Abdomen, Front perineal area, Left upper leg, Right upper leg, Face, Left lower leg, Right lower leg, Buttocks   Body parts bathed by helper: Buttocks     Bathing assist Assist Level: Independent with assistive device     Upper Body Dressing/Undressing Upper body dressing   What is the patient wearing?: Pull over shirt    Upper body assist Assist Level: Independent with assistive device    Lower Body Dressing/Undressing Lower body dressing      What is  the patient wearing?: Underwear/pull up, Pants     Lower body assist Assist for lower body dressing: Independent with assitive device     Toileting Toileting    Toileting assist Assist for toileting: Independent with assistive device     Transfers Chair/bed transfer  Transfers assist     Chair/bed transfer assist level: Independent with assistive device Chair/bed transfer assistive device: Geologist, engineering   Ambulation assist      Assist level: Independent with assistive device Assistive device: Walker-rolling Max distance: 230 ft   Walk 10 feet activity   Assist      Assist level: Independent with assistive device Assistive device: Walker-rolling   Walk 50 feet activity   Assist Walk 50 feet with 2 turns activity did not occur: Safety/medical concerns (pain, weakness)  Assist level: Independent with assistive device Assistive device: Walker-rolling    Walk 150 feet activity   Assist Walk 150 feet activity did not occur: Safety/medical concerns  Assist level: Independent with assistive device Assistive device: Walker-rolling    Walk 10 feet on uneven surface  activity   Assist     Assist level: Independent with assistive device Assistive device: Walker-rolling   Wheelchair     Assist Is the patient using a wheelchair?: Yes Type of Wheelchair: Manual    Wheelchair assist level: Independent Max wheelchair distance: 150 ft    Wheelchair 50 feet with 2 turns activity    Assist        Assist Level: Independent   Wheelchair 150 feet activity     Assist      Assist Level: Independent   Blood pressure 136/60, pulse 70, temperature 99.1 F (37.3 C), temperature source Oral, resp. rate 17, height 5\' 8"  (1.727 m), weight 85 kg, SpO2 93 %.  Medical Problem List and Plan: 1. Functional deficits secondary to left intertrochanteric femur fracture S/P IM nail and ORIF- is WBAT             -patient may shower             -ELOS/Goals: 7 days modI             d/c 5/23  L hip xray just normal swelling- hardware looks OK  Advised pt to not get OOB without staff  D/c today  Will need to get meds from PCP or surgeon after 7 days of meds- doesn't need f/u with me- can take Lasix prn if needed- PCP appt in next 30 days 2.  Antithrombotics: -DVT/anticoagulation:  Pharmaceutical: Lovenox 30 mg X 4 weeks             -antiplatelet therapy: aspirin 81 mg daily   3. Pain Management: Tylenol as needed             -continue gabapentin 300 mg BID             -continue Lidoderm daily   5/9- switched gabapentin to 600 mg QHS  since pt only took 300 mg QHS at home- also on Oxy 5-10 mg q4 hours prn- asked pt to take prior to AM therapy.   5/10-reports pain controlled overall, continue current regimen  5/16- having more sacral pain- L lateral- will have pt try Lidoderm patches he already has ordered, but refused because thought were Salonpas 5/17- Lidoderm patches were helpful- just having L groin pain- xray shows intact hardware- swelling improving Encouraged pt to take pain meds in Am and lunchtime for L groin pain. Explained it's from  L hip 5/18- having improved ROM/movement in L hip/LLE- con't regimen 5/20 Pt reports pain controlled, has not had prn hydrocodone today 5/21-5/22 pain much better controlled 4. Mood/Behavior/Sleep: LCSW to evaluate and provide emotional support             -antipsychotic agents: n/a   5. Neuropsych/cognition: This patient is capable of making decisions on his own behalf.   6. Skin/Wound Care: Routine skin care checks   5/9- Dressing in place- con't dressings 7. Fluids/Electrolytes/Nutrition: Routine Is and Os and follow-up chemistries             -carb modified diet   8: Hypertension: monitor TID and prn             -continue Norvasc 5  mg daily             -continue Coreg 3.125 mg BID             -continue Imdur 120 mg daily   5/9- BP labile- 110s-180s in last 24 hours- will monitor for trend  -5/10 BP controlled overall, continue current regimen however will schedule Coreg earlier in the morning per patient's request  5/19- BP's 100s to 150s- somewhat labile- could be pain related? Con't to monitor trend  5/20 Bps controlled, a little softer this am, continue to monitor for now and continue current regimen  5/23 BP controlled- con't regimen    08/31/2022    6:34 AM 08/31/2022    6:26 AM 08/30/2022    8:32 PM  Vitals with BMI  Weight 187 lbs 6 oz    BMI 28.5    Systolic  136 101  Diastolic  60 46  Pulse  70 66    9: Hyperlipidemia: continue statin     10: BPH/urinary  frequency/retention: continue Proscar   11: History of gout: continue allopurinol   12: Left hip fracture s/p IM nail 5/03 Dr. Blanchie Dessert             -WBAT LLE             -change dressing as needed for soiling/if wet   5.13- looks good- asked nursing to change, since folding up constantly.  13: CAD: s/p stenting X 2; stable. On statin, aspirin and BB. Restart Plavix   14: CKD stage IIIb>>IV: baseline Cr ~2.5 (home on Farxiga)             -follow-up BMP   5/9- Will restart Farxiga-   5/13- Cr up slightly to 2.71 and BUN 57- will recheck Wednesday and if not better, will need to give some fluids. 5/15- Cr down to 2.54 from 2.71 and BUN slightly better at 52 from 57- will con't to have pt push fluids and recheck next Monday.  5/20- Cr and BUN stable today at 2.52/47, continue to encourage fluids  15: COPD: stable             -continue albuterol inhaler prn phlegm   16: GERD/HH: continue Protonix and Carafate   17: OSA: not using CPAP (he is not clear regarding results of sleep study)   18: DM-2: CBGs QID and SSI (home on glipizide and Farxiga)  A1c 6.5%   5/9- will not restart Glipizide Cr is 2.42- will restart Farxiga 10 mg daily.   -5/10 CBGs a little improved, continue to monitor with restart of farxiga  5/13- CBG's 152-179 in last 24 hours- improved- will NOT restart glipizide  5/21- 5/22CBGs controlled- con't regimen  CBG (last 3)  Recent Labs    08/30/22 1645 08/30/22 2213 08/31/22 0626  GLUCAP 112* 130* 123*    19: Chronic diastolic CHF: continue meds as above             -daily weight   5/9- Weight 88.2 kg today  5/10 wt stable,continue to follow  5/11: weight reviewed and has decreased  5/13- Weight up slightly 0.6 kg- will monitor  5/14- weight up to 90.8 kg, however no LE edema and hasn't had BM in 2 days. Will monitor trend  5/16- Weight 87.6 kg- down slightly.   5/17- Weight 86.2 kg  5/18- weight 87.6 kg- if goes up again, will give Lasix   5/19- no weight  today- per nursing, working on it.   5/20 wt a little down today, continue to monitor  5/22- Weight down for yesterday- con't to monitor  5/23- weight up slightly, but usually 84- is 85 kg today- pt doesn't want ot take Lasix on day of d/c. Can take prn at home [facility] Weights   08/30/22 0500 08/30/22 1100 08/31/22 0634  Weight: 84.4 kg 84.3 kg 85 kg    20: ABLA, chronic anemia: follow-up CBC             -restart PO iron   5/9- Hb up to 10- con't to monitor  5/13- Hb slightly decreased to 9.7  5/20 Hb stable at 9.7 21: Pruritus/rash on back: will order Sarna              22: Tremors: continue primidone   23. Suboptimal vitamin D: start ergocalciferol 50,000U once per week   23. Hypotension: currently hypotensive, will d/c amlodipine  24. Loose stools/cosntipation discussed that these have improved  5/14- Now feels constipated- LBM 2 days ago- will give low dose Sorbitol 15cc at 11am, since doesn't want to have BM on night shift- also, was on Dulcolax tab vs Supp at home daily- will see if needs to add something. Will add Colace 1 tab daily since stool hard and Dulcolax 5 mg daily tablet.   5/15- pt said had "blow out"- after sorbitol- will decrease miralax to 17 G daily as well as stop Colace- since he has Dulcolax tab- pt insistent he only needs Dulcolax, but explained on opiates which constipated pt- he needs more meds.   5/19- LBM yesterday  5/21- wants laxatives reduced- stopped Senna and  changed Miralax to QOD.   5/22- still going daily 25. Indigestion: may overlap with chronic angina, discussed that his outpatient cardiologist prescribed nitroglycerin and this gives him relief, ordered here. 5/18- takes NTG for GERD   26. Shortness of breath: Albuterol ordered  5/18- added albuterol nebs scheduled for 1 day  5/19- back to prn today, but advised pt to ask for them  5/22- breathing much bette-r back to baseline 27. Upper airway congestion: incentive spirometer ordered  28.  Desaturation to 82 percent: CXR ordered   5/13- CXR (-) for anything acute  5/18-5/19 per #31 29. Urinary frequency and urgency  5/13- U/A (-) except for high glucose level- has order for timed voiding- will con't 5/16- Purewick at night- pt insistent since was tried. . 5/17- pt insistent that nursing needs to come immediately when has urge to void-  upset that nursing not here during report this AM- wants [place] to leave RW where he can get up on his own- explained we cannot do that due to fall risk.  30. Anal hemorrhoids  5/13- add Anusol suppository per pt  request.   5/15- says is going a little better  5/21- doing worse- bleeding- using suppository- will decrease Miralax to QOD and sotp Senna 31. Hypoxia/Drop in O2 sats  5/18- drop in O2 sats overnight- will schedule Albuterol nebs for 24 hours- wait on giving Lasix since -pt doesn't want AND CXR (-) for vascular congestion; will restart Guafenisin BID as well and check CT chest with contrast- is pending  5/19- VQ scan due to be done today, hopefully-  changed ot Lovenox 1mg /kg so can cover if has actually had PE- d dimer won't be helpful due to inflammatory changes/hip fx, most likely. Also, his O2 sats are variable- sometimes low in 80s and sometimes normal- CXR was negative Friday night- when these things were occurring. Gave albuterol nebs- were somewhat helpful per pt- educated him to ask for them when SOB- wasn't wearing O2 this Am, and denied SOB.   5/20 pt reports breathing is improved today, Clinical appears improved, CXR with retrocardiac left base atelectasis or infiltrate. O2 sat last was 97 on RA, VQ scan tomorrow.   5/21- per pt and nursing , had O2 sats of 84% last night, but not in flowsheet this way- his O2 sats on RA usually ~94%, but were 97% up until last Saturday.   5/22- O2 sats doing well in last 24 hours-91-96% on RA-educated pt on VQ scan results and that doesn't need to go home on Eliquis/lovenox- decreased lovenox down to  prevention dose.    5/23- doing well= resolved- asked pt to take Albuterol inhaler before he leaves- a little coarse, but sats good   I spent a total of 34   minutes on total care today- >50% coordination of care- due to  D/w pt about d/c plans- meds from surgeon after 7 days- wife is also being d/c;d from hospital today- so need to arrange- d/w nursing.    LOS: 15 days A FACE TO FACE EVALUATION WAS PERFORMED  Cheresa Siers 08/31/2022, 8:22 AM

## 2022-09-01 DIAGNOSIS — S72142D Displaced intertrochanteric fracture of left femur, subsequent encounter for closed fracture with routine healing: Secondary | ICD-10-CM | POA: Diagnosis not present

## 2022-09-07 DIAGNOSIS — R899 Unspecified abnormal finding in specimens from other organs, systems and tissues: Secondary | ICD-10-CM | POA: Diagnosis not present

## 2022-09-07 DIAGNOSIS — Z6828 Body mass index (BMI) 28.0-28.9, adult: Secondary | ICD-10-CM | POA: Diagnosis not present

## 2022-09-07 DIAGNOSIS — S72002D Fracture of unspecified part of neck of left femur, subsequent encounter for closed fracture with routine healing: Secondary | ICD-10-CM | POA: Diagnosis not present

## 2022-09-07 DIAGNOSIS — Z09 Encounter for follow-up examination after completed treatment for conditions other than malignant neoplasm: Secondary | ICD-10-CM | POA: Diagnosis not present

## 2022-09-07 DIAGNOSIS — Z9989 Dependence on other enabling machines and devices: Secondary | ICD-10-CM | POA: Diagnosis not present

## 2022-09-10 NOTE — Progress Notes (Unsigned)
Cardiology Office Note:    Date:  09/12/2022   ID:  Marco Acevedo, DOB 10-16-1934, MRN [id number]  PCP:  Daisy Floro, MD   [facility] Providers Cardiologist:  Donato Soeder, MD     Referring MD: Daisy Floro, MD   Chief Complaint: Aortic stenosis  History of Present Illness:    Marco Acevedo is a very pleasant 88 y.o. male with a hx of CAD, chronic HFpEF, HTN, HLD, obesity, OSA, stroke, CKD, type 2 DM, and mild AS.   Per prior notes he had prior stenting of Cx in 2007 in 2012 and DES to Cx in June 2014. Had left brain atheroembolic event from left carotid endarterectomy on 10/19/2017. Most recent cardiac catheterization 02/2019 for progressive chest pain with stable anatomy.  Cath 2020 showing diffuse CAD with findings similar to 2019, possibly diffusely diseased area in the first OM might be slightly worse, medical therapy recommended.   CKD is followed by nephrology.  Former patient of Dr. Katrinka Blazing, his last cardiology clinic visit was 07/18/2022 with Dr. Anne Fu. He reported chronic shortness of breath unchanged.  Plays golf twice a week.  No changes to treatment regimen were made and 21-month follow-up was recommended.  Admission 5/2-08/16/22 seen by cardiology for risk stratification for surgery. [place] he missed a wooden step and fell onto his left buttock and hip. Imaging revealed left femur fracture and he underwent left intertrochanteric femoral fx repair on 08/11/2022. He was admitted to rehab following hospitalization. Echo 08/11/2022 revealed LVEF 55 to 60%, no RWMA, grade 1 diastolic dysfunction, normal RV size and function, and moderate to severe AS with mean gradient 23 mmHg, AVA 0.9 cm but likely underestimated per report.  Today, he is here with his wife for follow-up. Reports he is having severe right hip pain. Was feeling well since discharge until 5 days ago. Was working with PT and when he was rolled to his right side causing excruciating  pain. Has had to increase pain medication and feels like he is not walking as well. Going to see ortho today. Was feeling congested during hospitalization, O2 sats were < 90. Used incentive spirometer in hospital and he had good results with sputum expectoration. Mild left leg mild edema that he feels has improved since hospital discharge, no swelling in right. He denies chest pain, shortness of breath, fatigue, palpitations, melena, hematuria, hemoptysis, diaphoresis, weakness, presyncope, syncope, orthopnea, and PND. Lab work done on 5/30 by PCP revealed worsening creatinine and improve hemoglobin.    Past Medical History:  Diagnosis Date   Anemia    low iron   Aortic stenosis    Arthritis    BPH (benign prostatic hyperplasia)    CAD (coronary artery disease)    Chronic diastolic CHF (congestive heart failure) (HCC)    CKD (chronic kidney disease), stage IV (HCC)    Claustrophobia    Complication of anesthesia    has to have head elevated to keep from strangling on post nasal drip   COPD (chronic obstructive pulmonary disease) (HCC)    Diabetes mellitus type II    type 2   GERD (gastroesophageal reflux disease)    Gout    Granuloma annulare    History of hiatal hernia    History of kidney stones    Litthotrispy   Hyperlipidemia    Hypertension    Neuropathy    Obesity    OSA (obstructive sleep apnea)    Stroke (  HCC) 10/2017   Weakness right arm and leg    Past Surgical History:  Procedure Laterality Date   BLEPHAROPLASTY     CARDIAC CATHETERIZATION  1610,9604   X 2 stents   CHOLECYSTECTOMY N/A 09/23/2014   Procedure: LAPAROSCOPIC CHOLECYSTECTOMY WITH INTRAOPERATIVE CHOLANGIOGRAM;  Surgeon: Chevis Pretty [name], MD;  Location: [facility] OR;  Service: General;  Laterality: N/A;   COLONOSCOPY     COLONOSCOPY WITH PROPOFOL N/A 03/22/2016   Procedure: COLONOSCOPY WITH PROPOFOL;  Surgeon: Charlott Rakes, MD;  Location: [facility] ENDOSCOPY;  Service: Endoscopy;  Laterality: N/A;   ENDARTERECTOMY  Left 10/19/2017   Procedure: ENDARTERECTOMY CAROTID LEFT;  Surgeon: Larina Earthly, MD;  Location: [facility] OR;  Service: Vascular;  Laterality: Left;   ESOPHAGOGASTRODUODENOSCOPY (EGD) WITH PROPOFOL N/A 03/22/2016   Procedure: ESOPHAGOGASTRODUODENOSCOPY (EGD) WITH PROPOFOL;  Surgeon: Charlott Rakes, MD;  Location: [facility] ENDOSCOPY;  Service: Endoscopy;  Laterality: N/A;   EYE SURGERY     both cataracts   INTRAMEDULLARY (IM) NAIL INTERTROCHANTERIC Left 08/11/2022   Procedure: INTRAMEDULLARY (IM) NAIL INTERTROCHANTERIC;  Surgeon: Joen Laura, MD;  Location: [facility] OR;  Service: Orthopedics;  Laterality: Left;   KNEE ARTHROSCOPY WITH MEDIAL MENISECTOMY Right 08/19/2013   Procedure: RIGHT KNEE ARTHROSCOPY WITH PARTIAL MEDIAL MENISECTOMY AND CHONDROPLASTY;  Surgeon: Velna Ochs, MD;  Location: [facility];  Service: Orthopedics;  Laterality: Right;   LEFT HEART CATH AND CORONARY ANGIOGRAPHY N/A 06/28/2016   Procedure: Left Heart Cath and Coronary Angiography;  Surgeon: Lyn Records, MD;  Location: [facility] INVASIVE CV LAB;  Service: Cardiovascular;  Laterality: N/A;   LEFT HEART CATH AND CORONARY ANGIOGRAPHY N/A 02/25/2019   Procedure: LEFT HEART CATH AND CORONARY ANGIOGRAPHY;  Surgeon: Lyn Records, MD;  Location: [facility] INVASIVE CV LAB;  Service: Cardiovascular;  Laterality: N/A;   LEFT HEART CATHETERIZATION WITH CORONARY ANGIOGRAM N/A 10/03/2011   Procedure: LEFT HEART CATHETERIZATION WITH CORONARY ANGIOGRAM;  Surgeon: Lesleigh Noe, MD;  Location: [facility] CATH LAB;  Service: Cardiovascular;  Laterality: N/A;   LEFT HEART CATHETERIZATION WITH CORONARY ANGIOGRAM N/A 09/12/2012   Procedure: LEFT HEART CATHETERIZATION WITH CORONARY ANGIOGRAM;  Surgeon: Lesleigh Noe, MD;  Location: [facility] CATH LAB;  Service: Cardiovascular;  Laterality: N/A;   LIPOMA EXCISION     Biospy only; left parotid gland   LITHOTRIPSY     none     PERCUTANEOUS CORONARY STENT INTERVENTION (PCI-S) N/A 09/17/2012   Procedure:  PERCUTANEOUS CORONARY STENT INTERVENTION (PCI-S);  Surgeon: Lesleigh Noe, MD;  Location: [facility] CATH LAB;  Service: Cardiovascular;  Laterality: N/A;   TRIGGER FINGER RELEASE Left 12/21/2015   Procedure: LEFT LONG FINGER TRIGGER RELEASE ;  Surgeon: Mack Hook, MD;  Location: [facility];  Service: Orthopedics;  Laterality: Left;    Current Medications: Current Meds  Medication Sig   albuterol (VENTOLIN HFA) 108 (90 Base) MCG/ACT inhaler Inhale 2 puffs into the lungs every 6 (six) hours as needed for wheezing or shortness of breath.    allopurinol (ZYLOPRIM) 100 MG tablet Take 100 mg by mouth daily.   aspirin EC 81 MG tablet Take 1 tablet (81 mg total) by mouth daily.   atorvastatin (LIPITOR) 80 MG tablet Take 1 tablet (80 mg total) by mouth daily at 6 PM.   carvedilol (COREG) 3.125 MG tablet Take 3.125 mg by mouth 2 (two) times daily with a meal.   cholecalciferol (VITAMIN D3) 25 MCG (1000 UT) tablet Take 1,000 Units by mouth daily.   clopidogrel (  PLAVIX) 75 MG tablet Take 1 tablet (75 mg total) by mouth daily.   dapagliflozin propanediol (FARXIGA) 10 MG TABS tablet Take 1 tablet (10 mg total) by mouth daily before breakfast.   Ferrous Sulfate (IRON) 325 (65 FE) MG TABS Take 325 mg by mouth daily.    finasteride (PROSCAR) 5 MG tablet Take 1 tablet (5 mg total) by mouth daily.   gabapentin (NEURONTIN) 300 MG capsule Take 2 capsules (600 mg total) by mouth at bedtime.   guaifenesin (HUMIBID E) 400 MG TABS tablet Take 400 mg by mouth at bedtime.   isosorbide mononitrate (IMDUR) 120 MG 24 hr tablet Take 1 tablet (120 mg total) by mouth daily.   nitroGLYCERIN (NITROSTAT) 0.4 MG SL tablet Place 0.4 mg under the tongue every 5 (five) minutes as needed for chest pain.   oxyCODONE (OXY IR/ROXICODONE) 5 MG immediate release tablet Take 1 tablet (5 mg total) by mouth every 6 (six) hours as needed for severe pain or breakthrough pain.   pantoprazole (PROTONIX) 40 MG tablet Take 40 mg by  mouth daily.   primidone (MYSOLINE) 50 MG tablet Take 50 mg by mouth at bedtime.    sucralfate (CARAFATE) 1 GM/10ML suspension Take 1 g by mouth 2 (two) times daily.   traMADol (ULTRAM) 50 MG tablet Take 1 tablet (50 mg total) by mouth at bedtime as needed for moderate pain. (Patient taking differently: Take 50 mg by mouth 2 (two) times daily. Take 1 tablet by mouth at 6pm, and 1 at bedtime.)   traZODone (DESYREL) 50 MG tablet Take 0.5-1 tablets (25-50 mg total) by mouth at bedtime as needed for sleep.   vitamin B-12 (CYANOCOBALAMIN) 1000 MCG tablet Take 1,000 mcg by mouth daily.     Allergies:   Cimetidine and Metformin hcl   Social History   Socioeconomic History   Marital status: Married    Spouse name: Not on file   Number of children: Not on file   Years of education: Not on file   Highest education level: Not on file  Occupational History   Occupation: retired  Tobacco Use   Smoking status: Former    Packs/day: 1.00    Years: 64.00    Additional pack years: 0.00    Total pack years: 64.00    Types: Cigarettes    Quit date: 05/29/2011    Years since quitting: 11.2   Smokeless tobacco: Never  Vaping Use   Vaping Use: Never used  Substance and Sexual Activity   Alcohol use: Yes    Comment: rarely   Drug use: No   Sexual activity: Not Currently  Other Topics Concern   Not on file  Social History Narrative   Denies Caffeine use    Social Determinants of Health   Financial Resource Strain: Not on file  Food Insecurity: No Food Insecurity (08/10/2022)   Hunger Vital Sign    Worried About Running Out of Food in the Last Year: Never true    Ran Out of Food in the Last Year: Never true  Transportation Needs: No Transportation Needs (08/10/2022)   PRAPARE - Administrator, Civil Service (Medical): No    Lack of Transportation (Non-Medical): No  Physical Activity: Not on file  Stress: Not on file  Social Connections: Not on file     Family History: The  patient's family history includes Aneurysm in his mother; Aortic aneurysm in his father; COPD in his sister; Cancer in his brother; Other in his brother,  sister, and sister.  ROS:   Please see the history of present illness.    + right hip pain + left leg edema All other systems reviewed and are negative.  Labs/Other Studies Reviewed:    The following studies were reviewed today:  Echo 08/11/2022 1. Technically difficult; aortic valve not well visualized but calcified  with reduced cusp excursion; likely moderate AS (mean gradient 23 mmHg; DI  0.30; AVA 0.9 cm2 but likely underestimated).   2. Left ventricular ejection fraction, by estimation, is 55 to 60%. The  left ventricle has normal function. The left ventricle has no regional  wall motion abnormalities. Left ventricular diastolic parameters are  consistent with Grade I diastolic  dysfunction (impaired relaxation). Elevated left atrial pressure.   3. Right ventricular systolic function is normal. The right ventricular  size is normal.   4. The mitral valve is normal in structure. No evidence of mitral valve  regurgitation. No evidence of mitral stenosis.   5. The aortic valve has an indeterminant number of cusps. Aortic valve  regurgitation is moderate to severe. Moderate to severe aortic valve  stenosis.   6. The inferior vena cava is normal in size with greater than 50%  respiratory variability, suggesting right atrial pressure of 3 mmHg.   Carotid Duplex 05/20/2021 Summary:  Right Carotid: Velocities in the right ICA are consistent with a 1-39%  stenosis.   Left Carotid: The extracranial vessels were near-normal with only minimal  wall               thickening or plaque.   Vertebrals:  Bilateral vertebral arteries demonstrate antegrade flow.  Subclavians: Normal flow hemodynamics were seen in bilateral subclavian               arteries.   *See table(s) above for measurements and observations.  Suggest follow up study  in 12 month  LHC 02/25/2019 Prox LAD to Mid LAD lesion is 55% stenosed. 1st Diag lesion is 85% stenosed.   Patent left main 50% mid LAD with 80% stenosis in the first diagonal (small caliber). Ostial circumflex 60 to 70%.  50 to 60% stenosis in the proximal stent.  Segmental 70% stenosis in the first obtuse marginal proximal to the distal first obtuse marginal stent which is widely patent.  Continuation of circumflex is small ending on 2 small obtuse marginal branches. Right coronary contains intermediate stenosis in the mid and distal segment of up to 60%.  No focal high-grade obstruction. Normal left ventricular end-diastolic pressure.  Contrast not given to minimize load. Findings are very similar to 2018.  The diffusely diseased area in the first obtuse marginal may be slightly worse.   Recommendations:   Continue medical therapy.  Circumflex disease is diffuse.  In absence of definite change in overall anatomy, additional exposure to contrast is felt to be higher risk (acute kidney injury) than continuing medical therapy.  Recent Labs: 08/16/2022: Magnesium 1.8 08/17/2022: ALT 15 08/28/2022: BUN 47; Creatinine, Ser 2.53; Hemoglobin 9.7; Platelets 306; Potassium 4.0; Sodium 136  Recent Lipid Panel    Component Value Date/Time   CHOL 220 (H) 10/14/2017 0742   TRIG 156 (H) 10/14/2017 0742   HDL 39 (L) 10/14/2017 0742   CHOLHDL 5.6 10/14/2017 0742   VLDL 31 10/14/2017 0742   LDLCALC 150 (H) 10/14/2017 0742     Risk Assessment/Calculations:           Physical Exam:    VS:  BP 110/66   Pulse 65  Ht 5\' 8"  (1.727 m)   Wt 189 lb (85.7 kg)   SpO2 93%   BMI 28.74 kg/m     Wt Readings from Last 3 Encounters:  09/12/22 189 lb (85.7 kg)  08/31/22 187 lb 6.3 oz (85 kg)  08/11/22 191 lb 12.8 oz (87 kg)     GEN:  Well nourished, well developed in no acute distress HEENT: Normal NECK: No JVD; No carotid bruits CARDIAC: RRR, holosystolic murmur. No rubs, gallops RESPIRATORY:   Clear to auscultation without rales, wheezing or rhonchi  ABDOMEN: Soft, non-tender, non-distended MUSCULOSKELETAL:  Mild LLE edema; No deformity. 2+ pedal pulses, equal bilaterally SKIN: Warm and dry NEUROLOGIC:  Alert and oriented x 3 PSYCHIATRIC:  Normal affect   EKG:  EKG is not ordered today.        Diagnoses:    1. Chronic diastolic CHF (congestive heart failure) (HCC)   2. Nonrheumatic aortic valve stenosis   3. Essential hypertension   4. Coronary artery disease involving native coronary artery of native heart without angina pectoris   5. CKD (chronic kidney disease) stage 4, GFR 15-29 ml/min (HCC)   6. OSA (obstructive sleep apnea)   7. Pure hypercholesterolemia    Assessment and Plan:     CAD without angina: History of prior stents to circumflex. Repeat cath 2020 revealed diffuse disease of circumflex, nonobstructive disease LAD, felt to be stable. Continue medical therapy per Dr. Katrinka Blazing. He denies chest pain, dyspnea, or other symptoms concerning for angina.  No indication for further ischemic evaluation at this time. No bleeding concerns. Continue aspirin, atorvastatin, carvedilol, Farxiga, Imdur.   Aortic stenosis: Likely moderate AAS with mean gradient 23 mmHg, DI 0.30, AVA 0.9 cm but likely underestimated on echo 08/11/22.  He is asymptomatic. Asks about initial finding of AS which was reported as mild on echo in 2019. We will repeat echo in 6 months and have him follow-up in the office afterwards. Advised him to notify us if he develops symptoms of worsening AS prior to appointment.   Chronic HFpEF: Normal LV EF 55 to 60%, G1 DD on echo 08/11/2022. Mild LLE edema that has improved since hip surgery 08/11/22.  Weight is stable.  He appears euvolemic on exam.  He denies dyspnea, shortness of breath, orthopnea, PND. Continue Farxiga, carvedilol.   Hyperlipidemia: LDL goal < 55.  LDL 60 on 11/30/2021. He is close to goal. Continue atorvastatin.   Hypertension: BP is  well-controlled.  CKD Stage 3b: Creatinine 2.79 on 09/07/2022, slight increase from hospitalization.  No medication changes today. Management per nephrology.   OSA:  Compliant with CPAP. No new concerns.      Disposition: 6 months with Dr. Anne Fu with echo prior  Medication Adjustments/Labs and Tests Ordered: Current medicines are reviewed at length with the patient today.  Concerns regarding medicines are outlined above.  Orders Placed This Encounter  Procedures   ECHOCARDIOGRAM COMPLETE   No orders of the defined types were placed in this encounter.   Patient Instructions  Medication Instructions:   Your physician recommends that you continue on your current medications as directed. Please refer to the Current Medication list given to you today.   *If you need a refill on your cardiac medications before your next appointment, please call your pharmacy*   Lab Work:  None ordered.  If you have labs (blood work) drawn today and your tests are completely normal, you will receive your results only by: MyChart Message (if you have MyChart) OR A  paper copy in the mail If you have any lab test that is abnormal or we need to change your treatment, we will call you to review the results.   Testing/Procedures:  Your physician has requested that you have an echocardiogram. Echocardiography is a painless test that uses sound waves to create images of your heart. It provides your doctor with information about the size and shape of your heart and how well your heart's chambers and valves are working. This procedure takes approximately one hour. There are no restrictions for this procedure. Please do NOT wear cologne, aftershave, or lotions (deodorant is allowed). Please arrive 15 minutes prior to your appointment time.    Follow-Up: At [facility] [place], you and your health needs are our priority.  As part of our continuing mission to provide you with exceptional heart care, we  have created designated Provider Care Teams.  These Care Teams include your primary Cardiologist (physician) and Advanced Practice Providers (APPs -  Physician Assistants and Nurse Practitioners) who all work together to provide you with the care you need, when you need it.  We recommend signing up for the patient portal called "MyChart".  Sign up information is provided on this After Visit Summary.  MyChart is used to connect with patients for Virtual Visits (Telemedicine).  Patients are able to view lab/test results, encounter notes, upcoming appointments, etc.  Non-urgent messages can be sent to your provider as well.   To learn more about what you can do with MyChart, go to [email or web address].    Your next appointment:   6 month(s)  Provider:   Donato Cambre, MD        Signed, Levi Aland, NP  09/12/2022 5:08 PM     HeartCare

## 2022-09-12 ENCOUNTER — Ambulatory Visit: Payer: Medicare (Managed Care) | Attending: Nurse Practitioner | Admitting: Nurse Practitioner

## 2022-09-12 ENCOUNTER — Encounter: Payer: Self-pay | Admitting: Nurse Practitioner

## 2022-09-12 VITALS — BP 110/66 | HR 65 | Ht 68.0 in | Wt 189.0 lb

## 2022-09-12 DIAGNOSIS — N184 Chronic kidney disease, stage 4 (severe): Secondary | ICD-10-CM | POA: Diagnosis not present

## 2022-09-12 DIAGNOSIS — E7800 Pure hypercholesterolemia, unspecified: Secondary | ICD-10-CM | POA: Diagnosis not present

## 2022-09-12 DIAGNOSIS — I5032 Chronic diastolic (congestive) heart failure: Secondary | ICD-10-CM | POA: Diagnosis not present

## 2022-09-12 DIAGNOSIS — I350 Nonrheumatic aortic (valve) stenosis: Secondary | ICD-10-CM

## 2022-09-12 DIAGNOSIS — I2510 Atherosclerotic heart disease of native coronary artery without angina pectoris: Secondary | ICD-10-CM | POA: Diagnosis not present

## 2022-09-12 DIAGNOSIS — S72142D Displaced intertrochanteric fracture of left femur, subsequent encounter for closed fracture with routine healing: Secondary | ICD-10-CM | POA: Diagnosis not present

## 2022-09-12 DIAGNOSIS — G4733 Obstructive sleep apnea (adult) (pediatric): Secondary | ICD-10-CM | POA: Diagnosis not present

## 2022-09-12 DIAGNOSIS — I10 Essential (primary) hypertension: Secondary | ICD-10-CM | POA: Diagnosis not present

## 2022-09-12 NOTE — Patient Instructions (Signed)
Medication Instructions:   Your physician recommends that you continue on your current medications as directed. Please refer to the Current Medication list given to you today.   *If you need a refill on your cardiac medications before your next appointment, please call your pharmacy*   Lab Work:  None ordered.  If you have labs (blood work) drawn today and your tests are completely normal, you will receive your results only by: MyChart Message (if you have MyChart) OR A paper copy in the mail If you have any lab test that is abnormal or we need to change your treatment, we will call you to review the results.   Testing/Procedures:  Your physician has requested that you have an echocardiogram. Echocardiography is a painless test that uses sound waves to create images of your heart. It provides your doctor with information about the size and shape of your heart and how well your heart's chambers and valves are working. This procedure takes approximately one hour. There are no restrictions for this procedure. Please do NOT wear cologne, aftershave, or lotions (deodorant is allowed). Please arrive 15 minutes prior to your appointment time.    Follow-Up: At [facility], you and your health needs are our priority.  As part of our continuing mission to provide you with exceptional heart care, we have created designated Provider Care Teams.  These Care Teams include your primary Cardiologist (physician) and Advanced Practice Providers (APPs -  Physician Assistants and Nurse Practitioners) who all work together to provide you with the care you need, when you need it.  We recommend signing up for the patient portal called "MyChart".  Sign up information is provided on this After Visit Summary.  MyChart is used to connect with patients for Virtual Visits (Telemedicine).  Patients are able to view lab/test results, encounter notes, upcoming appointments, etc.  Non-urgent messages can be  sent to your provider as well.   To learn more about what you can do with MyChart, go to [email or web address].    Your next appointment:   6 month(s)  Provider:   Mark Skains, MD     

## 2022-09-28 DIAGNOSIS — M7632 Iliotibial band syndrome, left leg: Secondary | ICD-10-CM | POA: Diagnosis not present

## 2022-09-28 DIAGNOSIS — M25552 Pain in left hip: Secondary | ICD-10-CM | POA: Diagnosis not present

## 2022-09-28 DIAGNOSIS — M25561 Pain in right knee: Secondary | ICD-10-CM | POA: Diagnosis not present

## 2022-10-04 DIAGNOSIS — L603 Nail dystrophy: Secondary | ICD-10-CM | POA: Diagnosis not present

## 2022-10-04 DIAGNOSIS — E1151 Type 2 diabetes mellitus with diabetic peripheral angiopathy without gangrene: Secondary | ICD-10-CM | POA: Diagnosis not present

## 2022-10-04 DIAGNOSIS — I739 Peripheral vascular disease, unspecified: Secondary | ICD-10-CM | POA: Diagnosis not present

## 2022-10-04 DIAGNOSIS — E1142 Type 2 diabetes mellitus with diabetic polyneuropathy: Secondary | ICD-10-CM | POA: Diagnosis not present

## 2022-10-10 DIAGNOSIS — S72142D Displaced intertrochanteric fracture of left femur, subsequent encounter for closed fracture with routine healing: Secondary | ICD-10-CM | POA: Diagnosis not present

## 2022-11-06 ENCOUNTER — Encounter (INDEPENDENT_AMBULATORY_CARE_PROVIDER_SITE_OTHER): Payer: Medicare (Managed Care) | Admitting: Cardiology

## 2022-11-06 DIAGNOSIS — I10 Essential (primary) hypertension: Secondary | ICD-10-CM

## 2022-11-07 DIAGNOSIS — L97512 Non-pressure chronic ulcer of other part of right foot with fat layer exposed: Secondary | ICD-10-CM | POA: Diagnosis not present

## 2022-11-07 DIAGNOSIS — L080 Pyoderma: Secondary | ICD-10-CM | POA: Diagnosis not present

## 2022-11-07 DIAGNOSIS — M2041 Other hammer toe(s) (acquired), right foot: Secondary | ICD-10-CM | POA: Diagnosis not present

## 2022-11-14 DIAGNOSIS — L97512 Non-pressure chronic ulcer of other part of right foot with fat layer exposed: Secondary | ICD-10-CM | POA: Diagnosis not present

## 2022-11-14 NOTE — Telephone Encounter (Signed)
Please see the MyChart message reply(ies) for my assessment and plan.    Reviewed your blood work from the [place].  Thank you for sharing this with [place].  Kidney function remained stable.  Continue with current regimen.  This patient gave consent for this Medical Advice Message and is aware that it may result in a bill to [employer], as well as the possibility of receiving a bill for a co-payment or deductible. They are an established patient, but are not seeking medical advice exclusively about a problem treated during an in person or video visit in the last seven days. I did not recommend an in person or video visit within seven days of my reply.    I spent a total of 5 minutes cumulative time within 7 days through [employer].  Donato Sesler, MD

## 2022-11-21 DIAGNOSIS — L97512 Non-pressure chronic ulcer of other part of right foot with fat layer exposed: Secondary | ICD-10-CM | POA: Diagnosis not present

## 2022-12-05 ENCOUNTER — Telehealth: Payer: Self-pay | Admitting: Cardiology

## 2022-12-05 DIAGNOSIS — L97512 Non-pressure chronic ulcer of other part of right foot with fat layer exposed: Secondary | ICD-10-CM | POA: Diagnosis not present

## 2022-12-05 MED ORDER — DAPAGLIFLOZIN PROPANEDIOL 10 MG PO TABS: 10.0000 mg | ORAL_TABLET | Freq: Every day | ORAL | 3 refills | Status: DC

## 2022-12-05 NOTE — Telephone Encounter (Signed)
Pt c/o medication issue:  1. Name of Medication: dapagliflozin propanediol (FARXIGA) 10 MG TABS tablet   2. How are you currently taking this medication (dosage and times per day)? As prescribed   3. Are you having a reaction (difficulty breathing--STAT)?   4. What is your medication issue? Patient needs this medication called into the following with name and DOB. He states that his prescription ran out for this assistance.     [place] [facility] [place]  Fax [phone]

## 2022-12-05 NOTE — Telephone Encounter (Signed)
**Note De-Identified Phala Schraeder Obfuscation** I have e-scribed the pts Farxiga refill to [facility] ([facility] [place] [facility] [place]) for #90 with 3 refills as requested by the pt.

## 2022-12-06 DIAGNOSIS — I129 Hypertensive chronic kidney disease with stage 1 through stage 4 chronic kidney disease, or unspecified chronic kidney disease: Secondary | ICD-10-CM | POA: Diagnosis not present

## 2022-12-06 DIAGNOSIS — G629 Polyneuropathy, unspecified: Secondary | ICD-10-CM | POA: Diagnosis not present

## 2022-12-06 DIAGNOSIS — R809 Proteinuria, unspecified: Secondary | ICD-10-CM | POA: Diagnosis not present

## 2022-12-06 DIAGNOSIS — I5032 Chronic diastolic (congestive) heart failure: Secondary | ICD-10-CM | POA: Diagnosis not present

## 2022-12-06 DIAGNOSIS — E875 Hyperkalemia: Secondary | ICD-10-CM | POA: Diagnosis not present

## 2022-12-06 DIAGNOSIS — E1122 Type 2 diabetes mellitus with diabetic chronic kidney disease: Secondary | ICD-10-CM | POA: Diagnosis not present

## 2022-12-06 DIAGNOSIS — N184 Chronic kidney disease, stage 4 (severe): Secondary | ICD-10-CM | POA: Diagnosis not present

## 2022-12-06 DIAGNOSIS — K219 Gastro-esophageal reflux disease without esophagitis: Secondary | ICD-10-CM | POA: Diagnosis not present

## 2022-12-13 DIAGNOSIS — I10 Essential (primary) hypertension: Secondary | ICD-10-CM | POA: Diagnosis not present

## 2022-12-13 DIAGNOSIS — Z125 Encounter for screening for malignant neoplasm of prostate: Secondary | ICD-10-CM | POA: Diagnosis not present

## 2022-12-13 DIAGNOSIS — M109 Gout, unspecified: Secondary | ICD-10-CM | POA: Diagnosis not present

## 2022-12-13 DIAGNOSIS — E7800 Pure hypercholesterolemia, unspecified: Secondary | ICD-10-CM | POA: Diagnosis not present

## 2022-12-13 DIAGNOSIS — Z79899 Other long term (current) drug therapy: Secondary | ICD-10-CM | POA: Diagnosis not present

## 2022-12-19 DIAGNOSIS — Z23 Encounter for immunization: Secondary | ICD-10-CM | POA: Diagnosis not present

## 2022-12-19 DIAGNOSIS — M109 Gout, unspecified: Secondary | ICD-10-CM | POA: Diagnosis not present

## 2022-12-19 DIAGNOSIS — E7800 Pure hypercholesterolemia, unspecified: Secondary | ICD-10-CM | POA: Diagnosis not present

## 2022-12-19 DIAGNOSIS — J449 Chronic obstructive pulmonary disease, unspecified: Secondary | ICD-10-CM | POA: Diagnosis not present

## 2022-12-19 DIAGNOSIS — Z0000 Encounter for general adult medical examination without abnormal findings: Secondary | ICD-10-CM | POA: Diagnosis not present

## 2022-12-19 DIAGNOSIS — N184 Chronic kidney disease, stage 4 (severe): Secondary | ICD-10-CM | POA: Diagnosis not present

## 2022-12-19 DIAGNOSIS — Z9181 History of falling: Secondary | ICD-10-CM | POA: Diagnosis not present

## 2022-12-19 DIAGNOSIS — Z6828 Body mass index (BMI) 28.0-28.9, adult: Secondary | ICD-10-CM | POA: Diagnosis not present

## 2022-12-19 DIAGNOSIS — E1122 Type 2 diabetes mellitus with diabetic chronic kidney disease: Secondary | ICD-10-CM | POA: Diagnosis not present

## 2022-12-19 DIAGNOSIS — E1142 Type 2 diabetes mellitus with diabetic polyneuropathy: Secondary | ICD-10-CM | POA: Diagnosis not present

## 2022-12-19 DIAGNOSIS — L97512 Non-pressure chronic ulcer of other part of right foot with fat layer exposed: Secondary | ICD-10-CM | POA: Diagnosis not present

## 2022-12-19 DIAGNOSIS — I10 Essential (primary) hypertension: Secondary | ICD-10-CM | POA: Diagnosis not present

## 2022-12-19 DIAGNOSIS — I2510 Atherosclerotic heart disease of native coronary artery without angina pectoris: Secondary | ICD-10-CM | POA: Diagnosis not present

## 2023-01-02 DIAGNOSIS — E1151 Type 2 diabetes mellitus with diabetic peripheral angiopathy without gangrene: Secondary | ICD-10-CM | POA: Diagnosis not present

## 2023-01-02 DIAGNOSIS — L603 Nail dystrophy: Secondary | ICD-10-CM | POA: Diagnosis not present

## 2023-01-02 DIAGNOSIS — I739 Peripheral vascular disease, unspecified: Secondary | ICD-10-CM | POA: Diagnosis not present

## 2023-01-02 DIAGNOSIS — L97512 Non-pressure chronic ulcer of other part of right foot with fat layer exposed: Secondary | ICD-10-CM | POA: Diagnosis not present

## 2023-01-16 DIAGNOSIS — L97512 Non-pressure chronic ulcer of other part of right foot with fat layer exposed: Secondary | ICD-10-CM | POA: Diagnosis not present

## 2023-01-24 DIAGNOSIS — M79674 Pain in right toe(s): Secondary | ICD-10-CM | POA: Diagnosis not present

## 2023-02-07 DIAGNOSIS — M79674 Pain in right toe(s): Secondary | ICD-10-CM | POA: Diagnosis not present

## 2023-03-06 ENCOUNTER — Encounter: Payer: Self-pay | Admitting: Cardiology

## 2023-03-06 NOTE — Telephone Encounter (Signed)
error 

## 2023-03-07 ENCOUNTER — Ambulatory Visit (HOSPITAL_COMMUNITY): Payer: Medicare (Managed Care) | Attending: Nurse Practitioner

## 2023-03-07 DIAGNOSIS — I2510 Atherosclerotic heart disease of native coronary artery without angina pectoris: Secondary | ICD-10-CM | POA: Insufficient documentation

## 2023-03-07 DIAGNOSIS — I350 Nonrheumatic aortic (valve) stenosis: Secondary | ICD-10-CM | POA: Diagnosis not present

## 2023-03-07 DIAGNOSIS — I5032 Chronic diastolic (congestive) heart failure: Secondary | ICD-10-CM | POA: Diagnosis not present

## 2023-03-07 DIAGNOSIS — I10 Essential (primary) hypertension: Secondary | ICD-10-CM | POA: Diagnosis not present

## 2023-03-07 LAB — ECHOCARDIOGRAM COMPLETE
AR max vel: 1.12 cm2
AV Area VTI: 1.23 cm2
AV Area mean vel: 1.12 cm2
AV Mean grad: 25.3 mm[Hg]
AV Peak grad: 43.1 mm[Hg]
Ao pk vel: 3.28 m/s
Area-P 1/2: 2.62 cm2
S' Lateral: 2.7 cm

## 2023-03-12 ENCOUNTER — Other Ambulatory Visit (HOSPITAL_COMMUNITY): Payer: Medicare (Managed Care)

## 2023-03-26 ENCOUNTER — Encounter: Payer: Self-pay | Admitting: Cardiology

## 2023-03-26 ENCOUNTER — Ambulatory Visit: Payer: No Typology Code available for payment source | Attending: Cardiology | Admitting: Cardiology

## 2023-03-26 VITALS — BP 100/58 | HR 56 | Ht 68.0 in | Wt 193.2 lb

## 2023-03-26 DIAGNOSIS — I10 Essential (primary) hypertension: Secondary | ICD-10-CM

## 2023-03-26 DIAGNOSIS — I5032 Chronic diastolic (congestive) heart failure: Secondary | ICD-10-CM | POA: Diagnosis not present

## 2023-03-26 DIAGNOSIS — N184 Chronic kidney disease, stage 4 (severe): Secondary | ICD-10-CM

## 2023-03-26 NOTE — Progress Notes (Signed)
Cardiology Office Note:  .   Date:  03/26/2023  ID:  Marco Acevedo, DOB 26-Dec-1934, MRN [id number] PCP: Daisy Floro, MD  [facility] Providers Cardiologist:  Donato Stagliano, MD     History of Present Illness: .   Marco Acevedo is a 88 y.o. male Discussed with the use of AI scribe   History of Present Illness   The patient, an 88 year old male with a history of moderate to severe aortic stenosis, mild right carotid artery plaque, and chronic kidney disease stage 3B, presents for a follow-up visit. The patient's most recent echocardiogram showed a mean gradient of 23 millimeters of mercury and normal EF of 55 to 60%. A carotid duplex revealed mild right carotid artery plaque.    A left heart catheterization in November 2020 showed proximal LED of 55% and first diagonal of 85%, with a recommendation for medical management.  The patient's primary concern is heartburn, which occurs after eating and engaging in strenuous activity. The severity varies, and nitroglycerin has been effective in easing the discomfort. The patient also reports that the heartburn does not last long and is merely uncomfortable.  The patient's medication regimen includes Farxiga and carvedilol for diastolic heart failure. The patient confirms adherence to this regimen. The patient also reports taking atorvastatin, contrary to the medical record, which needs to be updated.  The patient's blood pressure readings have been slightly elevated at times, but not dangerously so. The patient's weight has remained stable. The patient has been diligent in tracking blood pressure and weight for the past four years.  The patient's most recent labs showed an LDL of 56, triglycerides of 122, hemoglobin A1c of 7.2, hemoglobin of 13.6, and an elevated creatinine of 2.48. The patient also has hyperlipidemia and obstructive sleep apnea, for which he uses CPAP.            Studies Reviewed: .        Results    LABS LDL: 56 (12/13/2022) Triglycerides: 122 (12/13/2022) Hemoglobin A1c: 7.2 (12/13/2022) Hemoglobin: 13.6 (12/13/2022) Creatinine: 2.48 (12/13/2022)  RADIOLOGY Carotid duplex: Mild right carotid artery plaque (05/20/2021)  DIAGNOSTIC Echocardiogram: Severe calcification, severe thickening, aortic valve mean gradient 25 mmHg, valve area 1.23 cm (03/07/2023) Echocardiogram: Moderate aortic stenosis, mean gradient 23 mmHg, normal pump function 55-60% (08/11/2022) Left heart catheterization: Proximal LAD 55%, first diagonal 85% (02/25/2019)     Risk Assessment/Calculations:            Physical Exam:   VS:  BP (!) 100/58   Pulse (!) 56   Ht 5\' 8"  (1.727 m)   Wt 193 lb 3.2 oz (87.6 kg)   SpO2 94%   BMI 29.38 kg/m    Wt Readings from Last 3 Encounters:  03/26/23 193 lb 3.2 oz (87.6 kg)  09/12/22 189 lb (85.7 kg)  08/31/22 187 lb 6.3 oz (85 kg)    GEN: Well nourished, well developed in no acute distress NECK: No JVD; No carotid bruits CARDIAC: RRR, 3/6 SM, no rubs, no gallops RESPIRATORY:  Clear to auscultation without rales, wheezing or rhonchi  ABDOMEN: Soft, non-tender, non-distended EXTREMITIES:  No edema; No deformity, bruising forearms  ASSESSMENT AND PLAN: .    Assessment and Plan    Aortic Stenosis Moderate to severe aortic stenosis with severe calcification and thickening. Mean gradient 25 mmHg, valve area 1.23 cm. Symptoms include heartburn relieved by nitroglycerin. Discussed potential future need for valve replacement and types of valves (tissue vs. mechanical) with associated risks and  benefits. Patient prefers to avoid additional anticoagulation. - Continue current medications - Schedule follow-up echocardiogram in one year  Coronary Artery Disease 55% stenosis in proximal LAD and 85% in the first diagonal. Symptoms include heartburn potentially related to coronary artery disease. Discussed use of nitroglycerin for symptom relief. - Continue current  medications including nitroglycerin as needed for heartburn  Diastolic Heart Failure Managed with carvedilol. - Continue carvedilol 3.125 mg twice daily  Chronic Kidney Disease Stage 3B/4 Elevated creatinine at 2.48 mg/dL. Managed with current medications. - Continue current medications  Diabetes Mellitus Hemoglobin A1c 7.2%. Managed with [place]. - Continue Farxiga 10 mg  Hyperlipidemia LDL 56 mg/dL, triglycerides 295 mg/dL. Managed with atorvastatin. - Continue atorvastatin 80 mg  Obstructive Sleep Apnea Managed with CPAP. - Continue CPAP therapy  General Health Maintenance Blood pressure generally well-controlled with occasional high readings. Weight stable with minor fluctuations attributed to fluid retention. - Continue monitoring blood pressure and weight - Maintain current medication regimen including aspirin 81 mg and Plavix 75 mg  Follow-up - Schedule follow-up appointment in one year - Ensure atorvastatin is correctly listed in medication records.               Signed, Donato Buch, MD

## 2023-03-26 NOTE — Patient Instructions (Signed)
Medication Instructions:  The current medical regimen is effective;  continue present plan and medications.  *If you need a refill on your cardiac medications before your next appointment, please call your pharmacy*  Testing/Procedures: Your physician has requested that you have an echocardiogram in 1 year. Echocardiography is a painless test that uses sound waves to create images of your heart. It provides your doctor with information about the size and shape of your heart and how well your heart's chambers and valves are working. This procedure takes approximately one hour. There are no restrictions for this procedure. Please do NOT wear cologne, perfume, aftershave, or lotions (deodorant is allowed). Please arrive 15 minutes prior to your appointment time.  Please note: We ask at that you not bring children with you during ultrasound (echo/ vascular) testing. Due to room size and safety concerns, children are not allowed in the ultrasound rooms during exams. Our front office staff cannot provide observation of children in our lobby area while testing is being conducted. An adult accompanying a patient to their appointment will only be allowed in the ultrasound room at the discretion of the ultrasound technician under special circumstances. We apologize for any inconvenience   Follow-Up: At [facility], you and your health needs are our priority.  As part of our continuing mission to provide you with exceptional heart care, we have created designated Provider Care Teams.  These Care Teams include your primary Cardiologist (physician) and Advanced Practice Providers (APPs -  Physician Assistants and Nurse Practitioners) who all work together to provide you with the care you need, when you need it.  We recommend signing up for the patient portal called "MyChart".  Sign up information is provided on this After Visit Summary.  MyChart is used to connect with patients for Virtual Visits  (Telemedicine).  Patients are able to view lab/test results, encounter notes, upcoming appointments, etc.  Non-urgent messages can be sent to your provider as well.   To learn more about what you can do with MyChart, go to [email or web address].    Your next appointment:   1 year(s)  Provider:   Donato Axon, MD

## 2023-03-28 ENCOUNTER — Ambulatory Visit (INDEPENDENT_AMBULATORY_CARE_PROVIDER_SITE_OTHER): Payer: Medicare (Managed Care) | Admitting: Podiatry

## 2023-03-28 ENCOUNTER — Encounter: Payer: Self-pay | Admitting: Podiatry

## 2023-03-28 VITALS — Ht 68.0 in | Wt 193.2 lb

## 2023-03-28 DIAGNOSIS — B351 Tinea unguium: Secondary | ICD-10-CM

## 2023-03-28 DIAGNOSIS — R6889 Other general symptoms and signs: Secondary | ICD-10-CM | POA: Insufficient documentation

## 2023-03-28 DIAGNOSIS — M79675 Pain in left toe(s): Secondary | ICD-10-CM | POA: Diagnosis not present

## 2023-03-28 DIAGNOSIS — M79674 Pain in right toe(s): Secondary | ICD-10-CM

## 2023-03-28 DIAGNOSIS — L84 Corns and callosities: Secondary | ICD-10-CM

## 2023-03-28 DIAGNOSIS — E1142 Type 2 diabetes mellitus with diabetic polyneuropathy: Secondary | ICD-10-CM | POA: Diagnosis not present

## 2023-03-28 DIAGNOSIS — I951 Orthostatic hypotension: Secondary | ICD-10-CM | POA: Insufficient documentation

## 2023-03-29 NOTE — Progress Notes (Signed)
Subjective:  Patient ID: Marco Acevedo, male    DOB: 12/04/1934,  MRN: [id number]  88 y.o. male presents at risk foot care with history of diabetic neuropathy and preulcerative lesion(s) right foot and painful mycotic toenails that limit ambulation. Painful toenails interfere with ambulation. Aggravating factors include wearing enclosed shoe gear. Pain is relieved with periodic professional debridement. Painful preulcerative lesion(s) is/are aggravated when weightbearing with and without shoegear. Pain is relieved with periodic professional debridement.  Patient has h/o ulcer distal tip of right 3rd digit. He states digit has healed after treatment via Orthopedics. He now wears a pad daily to assist with lifting the digit. He voices no new pedal concerns on today's visit. 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  He would like visit updates sent to his [place] physician at [facility]. Fax number is [phone].  Chief Complaint  Patient presents with   Nail Problem    Pt is here for [facility] unsure of his last A1C PCP is Dr Tenny Craw and LOV was 2-3 months ago.   PCP is Daisy Floro, MD.  Allergies  Allergen Reactions   Cimetidine Other (See Comments)    Anxiety Attacks   Metformin Hcl     Other reaction(s): decreased kidney function    Review of Systems: Negative except as noted in the HPI.   Objective:  MAKBEL WHITTUM is a pleasant 88 y.o. male in NAD. AAO x 3.  Vascular Examination: Vascular status intact b/l with palpable pedal pulses. CFT immediate b/l. Pedal hair present. No edema. No pain with calf compression b/l. Skin temperature gradient WNL b/l. No varicosities noted. No cyanosis or clubbing noted.  Neurological Examination: Pt has subjective symptoms of neuropathy. Sensation grossly intact b/l with 10 gram monofilament. Vibratory sensation intact b/l.  Dermatological Examination: Pedal skin with normal turgor, texture and tone b/l. No  open wounds nor interdigital macerations noted. Toenails 1-5 b/l thick, discolored, elongated with subungual debris and pain on dorsal palpation.   Preulcerative lesion noted distal tip of right 3rd toe. There is visible subdermal hemorrhage. There is no surrounding erythema, no edema, no drainage, no odor, no fluctuance.  Musculoskeletal Examination: Muscle strength 5/5 to b/l LE.  No pain, crepitus noted b/l. Clawtoe deformity R 3rd toe.  Radiographs: None  Last A1c:      Latest Ref Rng & Units 08/10/2022    6:07 PM  Hemoglobin A1C  Hemoglobin-A1c 4.8 - 5.6 % 6.5    Assessment:   1. Pain due to onychomycosis of toenails of both feet   2. Pre-ulcerative corn or callous   3. Diabetic peripheral neuropathy associated with type 2 diabetes mellitus (HCC)    Plan:  -Patient was evaluated today. All questions/concerns addressed on today's visit. -Continue foot and shoe inspections daily. Monitor blood glucose per PCP/Endocrinologist's recommendations. -Patient to continue soft, supportive shoe gear daily. -Toenails 1-5 b/l were debrided in length and girth with sterile nail nippers and dremel without iatrogenic bleeding.  -Preulcerative lesion pared R 3rd toe utilizing sterile scalpel blade. Total number pared=1. -Continue padding to right 3rd digit daily for protection. -Patient/POA to call should there be question/concern in the interim.  Return in about 3 months (around 06/26/2023).  Freddie Breech, DPM      [place] LOCATION: [address]  [place], [place] [address]                   Office [phone]   [facility] LOCATION: [address] [place], [place] [address] Office [phone]

## 2023-04-10 ENCOUNTER — Telehealth: Payer: Self-pay | Admitting: Cardiology

## 2023-04-10 DIAGNOSIS — R059 Cough, unspecified: Secondary | ICD-10-CM | POA: Diagnosis not present

## 2023-04-10 DIAGNOSIS — E1140 Type 2 diabetes mellitus with diabetic neuropathy, unspecified: Secondary | ICD-10-CM | POA: Diagnosis not present

## 2023-04-10 DIAGNOSIS — I10 Essential (primary) hypertension: Secondary | ICD-10-CM | POA: Diagnosis not present

## 2023-04-10 DIAGNOSIS — Z6828 Body mass index (BMI) 28.0-28.9, adult: Secondary | ICD-10-CM | POA: Diagnosis not present

## 2023-04-10 NOTE — Telephone Encounter (Signed)
[facility] states the [facility] faxed paperwork to the office and they would like to have it completed. Patient states he doesn't know what type of paperwork it was, but he provided a phone number for the [facility], [phone] (ext#: [phone]).

## 2023-04-19 NOTE — Telephone Encounter (Signed)
 Left voicemail for pt we have not received any paperwork from the [place] that I am able to locate.  Requested he contact [facility] to have them re-send it.

## 2023-04-20 NOTE — Telephone Encounter (Signed)
 Follow Up:     Patient called and said the [facility] said they need you to call [phone] Ext [phone]. He said the [place] will tell you exactly what they need. He needs this asap please.

## 2023-04-20 NOTE — Telephone Encounter (Signed)
 Called [facility] at number and extension given by patient in phone note. Spoke with Joy who states patient's referral expired 07/15/2022. She states we would need to send a request for service form and last OV note faxed to [phone].

## 2023-05-10 ENCOUNTER — Encounter: Payer: Self-pay | Admitting: Cardiology

## 2023-06-06 DIAGNOSIS — E119 Type 2 diabetes mellitus without complications: Secondary | ICD-10-CM | POA: Diagnosis not present

## 2023-06-06 DIAGNOSIS — R809 Proteinuria, unspecified: Secondary | ICD-10-CM | POA: Diagnosis not present

## 2023-06-06 DIAGNOSIS — I10 Essential (primary) hypertension: Secondary | ICD-10-CM | POA: Diagnosis not present

## 2023-06-06 DIAGNOSIS — I5032 Chronic diastolic (congestive) heart failure: Secondary | ICD-10-CM | POA: Diagnosis not present

## 2023-06-06 DIAGNOSIS — N184 Chronic kidney disease, stage 4 (severe): Secondary | ICD-10-CM | POA: Diagnosis not present

## 2023-06-06 DIAGNOSIS — E875 Hyperkalemia: Secondary | ICD-10-CM | POA: Diagnosis not present

## 2023-06-06 DIAGNOSIS — G629 Polyneuropathy, unspecified: Secondary | ICD-10-CM | POA: Diagnosis not present

## 2023-06-06 DIAGNOSIS — K219 Gastro-esophageal reflux disease without esophagitis: Secondary | ICD-10-CM | POA: Diagnosis not present

## 2023-06-13 DIAGNOSIS — M545 Low back pain, unspecified: Secondary | ICD-10-CM | POA: Diagnosis not present

## 2023-06-13 DIAGNOSIS — M25552 Pain in left hip: Secondary | ICD-10-CM | POA: Diagnosis not present

## 2023-06-20 DIAGNOSIS — M25552 Pain in left hip: Secondary | ICD-10-CM | POA: Diagnosis not present

## 2023-06-20 DIAGNOSIS — M545 Low back pain, unspecified: Secondary | ICD-10-CM | POA: Diagnosis not present

## 2023-06-29 ENCOUNTER — Encounter: Payer: Self-pay | Admitting: Cardiology

## 2023-07-10 ENCOUNTER — Telehealth: Payer: Self-pay | Admitting: Cardiology

## 2023-07-10 NOTE — Telephone Encounter (Signed)
 Spoke with pt who would like to see Dr Anne Fu before June 16.2025.  he is scheduled 6/13 with Tereso Newcomer, PA  for follow up requested by the [facility].  Pt wold like this appt to be with Dr Anne Fu.  Advised pt Dr Anne Fu next available appt is 10/30/2023 but I will place him on the wait list should there be a cancellation.  Pt states understanding.

## 2023-07-10 NOTE — Telephone Encounter (Signed)
 Patient stated he needs to continue is [facility] community [facility] for insurance so it  will still be zero cost to patient.  This will expire on 09/21/23 and needs appointment with only Dr. Anne Fu right before it expires. Informed patient that Dr. Anne Fu nurse will have to call him to work out something with the schedule. Patient stated he looked forward to talking to her.

## 2023-07-10 NOTE — Telephone Encounter (Signed)
 Pt wants to discuss his community care. Please advise

## 2023-07-11 ENCOUNTER — Ambulatory Visit: Admitting: Nurse Practitioner

## 2023-07-11 DIAGNOSIS — M542 Cervicalgia: Secondary | ICD-10-CM | POA: Diagnosis not present

## 2023-07-26 ENCOUNTER — Ambulatory Visit (HOSPITAL_BASED_OUTPATIENT_CLINIC_OR_DEPARTMENT_OTHER): Payer: Medicare (Managed Care) | Admitting: Cardiology

## 2023-07-31 DIAGNOSIS — I10 Essential (primary) hypertension: Secondary | ICD-10-CM | POA: Diagnosis not present

## 2023-07-31 DIAGNOSIS — R519 Headache, unspecified: Secondary | ICD-10-CM | POA: Diagnosis not present

## 2023-07-31 DIAGNOSIS — Z09 Encounter for follow-up examination after completed treatment for conditions other than malignant neoplasm: Secondary | ICD-10-CM | POA: Diagnosis not present

## 2023-07-31 DIAGNOSIS — G629 Polyneuropathy, unspecified: Secondary | ICD-10-CM | POA: Diagnosis not present

## 2023-07-31 DIAGNOSIS — Z6827 Body mass index (BMI) 27.0-27.9, adult: Secondary | ICD-10-CM | POA: Diagnosis not present

## 2023-08-07 ENCOUNTER — Ambulatory Visit (INDEPENDENT_AMBULATORY_CARE_PROVIDER_SITE_OTHER): Payer: Medicare (Managed Care) | Admitting: Podiatry

## 2023-08-07 DIAGNOSIS — R059 Cough, unspecified: Secondary | ICD-10-CM | POA: Diagnosis not present

## 2023-08-07 DIAGNOSIS — J449 Chronic obstructive pulmonary disease, unspecified: Secondary | ICD-10-CM | POA: Diagnosis not present

## 2023-08-07 DIAGNOSIS — Z91199 Patient's noncompliance with other medical treatment and regimen due to unspecified reason: Secondary | ICD-10-CM

## 2023-08-07 DIAGNOSIS — Z6828 Body mass index (BMI) 28.0-28.9, adult: Secondary | ICD-10-CM | POA: Diagnosis not present

## 2023-08-07 NOTE — Progress Notes (Signed)
 1. No-show for appointment

## 2023-08-23 ENCOUNTER — Ambulatory Visit (INDEPENDENT_AMBULATORY_CARE_PROVIDER_SITE_OTHER): Payer: Medicare (Managed Care) | Admitting: Cardiology

## 2023-08-23 ENCOUNTER — Encounter (HOSPITAL_BASED_OUTPATIENT_CLINIC_OR_DEPARTMENT_OTHER): Payer: Self-pay | Admitting: Cardiology

## 2023-08-23 VITALS — BP 152/82 | HR 74 | Ht 65.0 in | Wt 180.8 lb

## 2023-08-23 DIAGNOSIS — N184 Chronic kidney disease, stage 4 (severe): Secondary | ICD-10-CM | POA: Diagnosis not present

## 2023-08-23 DIAGNOSIS — I10 Essential (primary) hypertension: Secondary | ICD-10-CM

## 2023-08-23 DIAGNOSIS — I5032 Chronic diastolic (congestive) heart failure: Secondary | ICD-10-CM

## 2023-08-23 NOTE — Progress Notes (Signed)
 Cardiology Office Note:  .   Date:  08/23/2023  ID:  Marco Acevedo, DOB 1934-08-10, MRN [id number] PCP: Jimmey Mould, MD  [facility] Providers Cardiologist:  Dorothye Gathers, MD     History of Present Illness: .   Marco Acevedo is a 89 y.o. male Discussed the use of AI scribe software for clinical note transcription with the patient, who gave verbal consent to proceed.  History of Present Illness Marco Acevedo "Marco Acevedo" is an 89 year old male with coronary artery disease and aortic stenosis who presents for follow-up.  He has a history of coronary artery disease, with a left heart catheterization in November 2020 revealing a 55% stenosis in the proximal LAD and an 85% stenosis in the first diagonal. Medical management was recommended. He also has moderate to severe aortic stenosis, with a mean gradient of 25 mmHg on echocardiogram in November 2024.  He experienced a hypertensive episode during a [place] outing at high altitude, where his blood pressure exceeded 200 mmHg, leading to epistaxis. He was taken to a hospital in [place], where his blood pressure was managed and he was discharged without admission. He has been managing his blood pressure by adjusting his medication regimen, including stopping carvedilol , which he notes has resulted in more stable blood pressure readings.  He has chronic kidney disease stage 3B, with a creatinine level of 2.5 mg/dL. His prior LDL was 56 mg/dL, and his W0J was 8.1%. He is on Farxiga  10 mg for diabetes management.  He uses CPAP for obstructive sleep apnea.  He reports a recent episode of mild pneumonia, which he describes as 'some kind of congestion.'  He has a history of a pinched nerve in his neck causing unbearable pain. He sought treatment from an orthopedic specialist and received relief through dry needling and manual manipulation.      Studies Reviewed: Aaron Aas    EKG Interpretation Date/Time:  Thursday Aug 23 2023 09:42:34  EDT Ventricular Rate:  75 PR Interval:  168 QRS Duration:  88 QT Interval:  386 QTC Calculation: 431 R Axis:   -42  Text Interpretation: Normal sinus rhythm Left axis deviation Moderate voltage criteria for LVH, may be normal variant ( R in aVL , Cornell product ) Septal infarct , age undetermined When compared with ECG of 11-Aug-2022 08:33, No significant change was found Confirmed by Dorothye Gathers ([id number]) on 08/23/2023 10:23:41 AM    Results LABS LDL: 56 mg/dL W2N: 5.6% Creatinine: 2.5 mg/dL  DIAGNOSTIC Left heart catheterization: Proximal LAD 55%, first diagonal 85% with recommendation of medical management (02/2019) Echocardiogram: Mean gradient 25 mmHg (02/2023) Carotid Doppler: Right carotid artery plaque EKG: Normal (07/28/2023)  Risk Assessment/Calculations:           Physical Exam:   VS:  BP (!) 152/82   Pulse 74   Ht 5\' 5"  (1.651 m)   Wt 180 lb 12.8 oz (82 kg)   SpO2 95%   BMI 30.09 kg/m    Wt Readings from Last 3 Encounters:  08/23/23 180 lb 12.8 oz (82 kg)  03/28/23 193 lb 3.2 oz (87.6 kg)  03/26/23 193 lb 3.2 oz (87.6 kg)    GEN: Well nourished, well developed in no acute distress NECK: No JVD; No carotid bruits CARDIAC: RRR, 3/6 SEM, no rubs, no gallops RESPIRATORY:  Clear to auscultation without rales, wheezing or rhonchi  ABDOMEN: Soft, non-tender, non-distended EXTREMITIES:  No edema; No deformity   ASSESSMENT AND PLAN: .  Assessment and Plan Assessment & Plan Aortic Stenosis Moderate to severe aortic stenosis with a mean gradient of 25 mmHg on echocardiogram in November 2024.  Coronary Artery Disease Coronary artery disease with prior left heart catheterization showing proximal LAD stenosis of 55% and first diagonal stenosis of 85%, managed with medical therapy.  Hypertension Hypertension with recent episode of blood pressure over 200 mmHg at high altitude, managed with medical intervention at hospital. Current blood pressure is 152/82 mmHg.  He has been experimenting with carvedilol  dosing and is currently not taking it. - Consider carvedilol  if blood pressure becomes very high. - Values on list of BP are normal without coreg .   Chronic Kidney Disease, Stage 3B Chronic kidney disease stage 3B with creatinine level of 2.5 mg/dL. Avoidance of NSAIDs advised. - Continue Farxiga  10 mg daily. - Avoid NSAIDs.  Diabetes Mellitus Diabetes mellitus with A1c of 7.2%. - Continue Farxiga  10 mg daily.  Obstructive Sleep Apnea Obstructive sleep apnea managed with CPAP therapy. - Continue CPAP therapy.  Pneumonia Recent mild case of pneumonia, resolved with treatment.          Dispo: 1 yr  Signed, Dorothye Gathers, MD

## 2023-08-23 NOTE — Patient Instructions (Signed)
 Medication Instructions:  Your physician recommends that you continue on your current medications as directed. Please refer to the Current Medication list given to you today.  *If you need a refill on your cardiac medications before your next appointment, please call your pharmacy*  Lab Work: NONE  Testing/Procedures: NONE  Follow-Up: At [facility], you and your health needs are our priority.  As part of our continuing mission to provide you with exceptional heart care, our providers are all part of one team.  This team includes your primary Cardiologist (physician) and Advanced Practice Providers or APPs (Physician Assistants and Nurse Practitioners) who all work together to provide you with the care you need, when you need it.  Your next appointment:   12 month(s)  Provider:   Dorothye Gathers, MD OR NP/PA    We recommend signing up for the patient portal called "MyChart".  Sign up information is provided on this After Visit Summary.  MyChart is used to connect with patients for Virtual Visits (Telemedicine).  Patients are able to view lab/test results, encounter notes, upcoming appointments, etc.  Non-urgent messages can be sent to your provider as well.   To learn more about what you can do with MyChart, go to [email or web address].

## 2023-09-21 ENCOUNTER — Ambulatory Visit: Payer: Medicare (Managed Care) | Admitting: Physician Assistant

## 2023-09-21 DIAGNOSIS — R0989 Other specified symptoms and signs involving the circulatory and respiratory systems: Secondary | ICD-10-CM | POA: Diagnosis not present

## 2023-09-21 DIAGNOSIS — R1013 Epigastric pain: Secondary | ICD-10-CM | POA: Diagnosis not present

## 2023-09-21 DIAGNOSIS — I10 Essential (primary) hypertension: Secondary | ICD-10-CM | POA: Diagnosis not present

## 2023-09-21 DIAGNOSIS — I5032 Chronic diastolic (congestive) heart failure: Secondary | ICD-10-CM | POA: Diagnosis not present

## 2023-09-21 DIAGNOSIS — Z6828 Body mass index (BMI) 28.0-28.9, adult: Secondary | ICD-10-CM | POA: Diagnosis not present

## 2023-09-21 DIAGNOSIS — E1140 Type 2 diabetes mellitus with diabetic neuropathy, unspecified: Secondary | ICD-10-CM | POA: Diagnosis not present

## 2023-09-21 DIAGNOSIS — R251 Tremor, unspecified: Secondary | ICD-10-CM | POA: Diagnosis not present

## 2023-10-05 ENCOUNTER — Encounter (HOSPITAL_BASED_OUTPATIENT_CLINIC_OR_DEPARTMENT_OTHER): Payer: Self-pay | Admitting: Cardiology

## 2023-10-05 NOTE — Telephone Encounter (Addendum)
 Sorry, I am a little confused. I have never seen this patient. It looks like this may be in regards to his wife Sam). If so, I already sent a response under her chart.  Thank you!

## 2023-11-13 ENCOUNTER — Telehealth: Payer: Self-pay

## 2023-11-13 ENCOUNTER — Other Ambulatory Visit (HOSPITAL_COMMUNITY): Payer: Self-pay

## 2023-11-13 ENCOUNTER — Telehealth: Payer: Self-pay | Admitting: Cardiology

## 2023-11-13 DIAGNOSIS — I5032 Chronic diastolic (congestive) heart failure: Secondary | ICD-10-CM

## 2023-11-13 NOTE — Telephone Encounter (Signed)
 Pharmacy Patient Advocate Encounter   Received notification from Physician's Office that prior authorization for [facility] is required/requested.   Insurance verification completed.   The patient is insured through [employer] .   Per test claim: The current 30 day co-pay is, $47.  No PA needed at this time. This test claim was processed through [facility]- copay amounts may vary at other pharmacies due to pharmacy/plan contracts, or as the patient moves through the different stages of their insurance plan.

## 2023-11-13 NOTE — Telephone Encounter (Signed)
*  STAT* If patient is at the pharmacy, call can be transferred to refill team.   1. Which medications need to be refilled? (please list name of each medication and dose if known)   dapagliflozin  propanediol (FARXIGA ) 10 MG TABS tablet   2. Would you like to learn more about the convenience, safety, & potential cost savings by using the [facility]?   3. Are you open to using the [facility] (Type [facility]. ).  4. Which pharmacy/location (including street and city if local pharmacy) is medication to be sent to?  [facility] [place] [phone] fax# [phone]  5. Do they need a 30 day or 90 day supply?   3 month supply with refills  Patient stated he is running out of this medication and gets his medication through the [facility] program.

## 2023-11-13 NOTE — Telephone Encounter (Addendum)
 Pharmacy Patient Advocate Encounter   Received notification from Physician's Office that prior authorization for FARXIGA  is required/requested.   Insurance verification completed.   The patient is insured through [employer] .   Per test claim: The current 30 day co-pay is, $47.  No PA needed at this time. This test claim was processed through [facility]- copay amounts may vary at other pharmacies due to pharmacy/plan contracts, or as the patient moves through the different stages of their insurance plan.

## 2023-11-14 ENCOUNTER — Ambulatory Visit (INDEPENDENT_AMBULATORY_CARE_PROVIDER_SITE_OTHER): Payer: Medicare (Managed Care) | Admitting: Podiatry

## 2023-11-14 DIAGNOSIS — Z91198 Patient's noncompliance with other medical treatment and regimen for other reason: Secondary | ICD-10-CM

## 2023-11-14 NOTE — Telephone Encounter (Signed)
 We are unable to send in refills.

## 2023-11-18 NOTE — Progress Notes (Signed)
 1. Failure to attend appointment with reason given    Rescheduled.

## 2023-11-20 MED ORDER — DAPAGLIFLOZIN PROPANEDIOL 10 MG PO TABS
10.0000 mg | ORAL_TABLET | Freq: Every day | ORAL | 3 refills | Status: DC
Start: 2023-11-20 — End: 2023-12-28

## 2023-12-19 ENCOUNTER — Ambulatory Visit: Payer: Medicare (Managed Care) | Admitting: Podiatry

## 2023-12-19 ENCOUNTER — Encounter: Payer: Self-pay | Admitting: Podiatry

## 2023-12-19 DIAGNOSIS — E1142 Type 2 diabetes mellitus with diabetic polyneuropathy: Secondary | ICD-10-CM

## 2023-12-19 DIAGNOSIS — B351 Tinea unguium: Secondary | ICD-10-CM | POA: Diagnosis not present

## 2023-12-19 DIAGNOSIS — M79674 Pain in right toe(s): Secondary | ICD-10-CM

## 2023-12-19 DIAGNOSIS — M79675 Pain in left toe(s): Secondary | ICD-10-CM | POA: Diagnosis not present

## 2023-12-23 ENCOUNTER — Encounter: Payer: Self-pay | Admitting: Podiatry

## 2023-12-23 NOTE — Progress Notes (Signed)
  Subjective:  Patient ID: Marco Acevedo, male    DOB: 11/02/1934,  MRN: [id number]  Marco Acevedo presents to clinic today for at risk foot care with history of diabetic neuropathy and painful mycotic toenails of both feet that are difficult to trim. Pain interferes with daily activities and wearing enclosed shoe gear comfortably.  Chief Complaint  Patient presents with   RFC    RFC  Diabetic A1c 6.5.  PCP Carlin Gull LV 09/21/23.  Has a Callus on R medial mid foot   New problem(s): None.   PCP is Gull Carlin Redbird, MD.  Allergies  Allergen Reactions   Cimetidine Other (See Comments)    Anxiety Attacks   Metformin  Hcl     Other reaction(s): decreased kidney function    Review of Systems: Negative except as noted in the HPI.  Objective: No changes noted in today's physical examination. There were no vitals filed for this visit. [name] is a pleasant 89 y.o. male WD, WN in NAD. AAO x 3.  Vascular Examination: Vascular status intact b/l with palpable pedal pulses. CFT immediate b/l. Pedal hair present. No edema. No pain with calf compression b/l. Skin temperature gradient WNL b/l. No varicosities noted. No cyanosis or clubbing noted.  Neurological Examination: Pt has subjective symptoms of neuropathy. Sensation grossly intact b/l with 10 gram monofilament. Vibratory sensation intact b/l.  Dermatological Examination: Pedal skin with normal turgor, texture and tone b/l. No open wounds nor interdigital macerations noted. Anonychia b/l great toes. Toenails 2-5 b/l thick, discolored, elongated with subungual debris and pain on dorsal palpation.   Resolved lesion distal tip of right 3rd toe. There is visible subdermal hemorrhage. There is no surrounding erythema, no edema, no drainage, no odor, no fluctuance.  Musculoskeletal Examination: Muscle strength 5/5 to b/l LE.  No pain, crepitus noted b/l. Clawtoe deformity R 3rd toe.  Radiographs: None  Assessment/Plan: 1. Pain  due to onychomycosis of toenails of both feet   2. Diabetic peripheral neuropathy associated with type 2 diabetes mellitus [facility])   Consent given for treatment. Patient examined. All patient's and/or POA's questions/concerns addressed on today's visit. Toenails 2-5 bilaterally debrided in length and girth without incident. Continue toe crest for right 3rd toe.Continue foot and shoe inspections daily. Monitor blood glucose per PCP/Endocrinologist's recommendations. Continue soft, supportive shoe gear daily. Report any pedal injuries to medical professional. Call office if there are any questions/concerns.  Return in about 3 months (around 03/19/2024).  Marco Acevedo, DPM      [facility] LOCATION: [address], [place] [address]                   Office [phone]   [facility] LOCATION: [address] [place], [place] [address] Office [phone]

## 2023-12-25 DIAGNOSIS — M109 Gout, unspecified: Secondary | ICD-10-CM | POA: Diagnosis not present

## 2023-12-25 DIAGNOSIS — I10 Essential (primary) hypertension: Secondary | ICD-10-CM | POA: Diagnosis not present

## 2023-12-25 DIAGNOSIS — E7800 Pure hypercholesterolemia, unspecified: Secondary | ICD-10-CM | POA: Diagnosis not present

## 2023-12-25 DIAGNOSIS — E1140 Type 2 diabetes mellitus with diabetic neuropathy, unspecified: Secondary | ICD-10-CM | POA: Diagnosis not present

## 2023-12-27 ENCOUNTER — Telehealth: Payer: Self-pay | Admitting: Cardiology

## 2023-12-27 NOTE — Telephone Encounter (Signed)
 Pt c/o of Chest Pain: STAT if active (IN THIS MOMENT) CP, including tightness, pressure, jaw pain, shoulder/upper arm/back pain, SOB, nausea, and vomiting.  1. Are you having CP right now (tightness, pressure, or discomfort)? No  2. Are you experiencing any other symptoms (ex. SOB, nausea, vomiting, sweating)? No  3. How long have you been experiencing CP? Couple days  4. Is your CP continuous or coming and going? Coming and going, worse when laying down at night and when sitting down rather than during physical exertion  5. Have you taken Nitroglycerin ? Yes last night  6. If CP returns before callback, please consider calling 911. ?

## 2023-12-27 NOTE — Telephone Encounter (Signed)
 I spoke with patient. He reports chest pain off and on. Started 2-3 days ago.  Happens at random times during the day. Lasts a few minutes.  Goes away on it's own.  He took NTG last night but it did not change his pain. Was able to golf today without pain but has had an episode since coming home. Describes as an ache in in his chest right over his heart. He is requesting office visit tomorrow.  Appointment made for patient to see Dr Harvest (DOD) at 11 AM tomorrow.

## 2023-12-28 ENCOUNTER — Other Ambulatory Visit: Payer: Self-pay | Admitting: Cardiology

## 2023-12-28 ENCOUNTER — Ambulatory Visit (HOSPITAL_COMMUNITY)
Admission: RE | Admit: 2023-12-28 | Discharge: 2023-12-28 | Disposition: A | Discharge status: Home / Self Care | Payer: Medicare (Managed Care) | Source: Ambulatory Visit | Attending: Cardiology | Admitting: Cardiology

## 2023-12-28 ENCOUNTER — Encounter: Payer: Self-pay | Admitting: Cardiology

## 2023-12-28 ENCOUNTER — Other Ambulatory Visit (HOSPITAL_COMMUNITY)
Admission: RE | Admit: 2023-12-28 | Discharge: 2023-12-28 | Disposition: A | Discharge status: Home / Self Care | Payer: Medicare (Managed Care) | Source: Ambulatory Visit | Attending: Cardiology | Admitting: Cardiology

## 2023-12-28 ENCOUNTER — Ambulatory Visit: Attending: Cardiology | Admitting: Cardiology

## 2023-12-28 VITALS — BP 134/68 | HR 60 | Ht 65.0 in | Wt 189.2 lb

## 2023-12-28 DIAGNOSIS — E7800 Pure hypercholesterolemia, unspecified: Secondary | ICD-10-CM | POA: Diagnosis not present

## 2023-12-28 DIAGNOSIS — I5032 Chronic diastolic (congestive) heart failure: Secondary | ICD-10-CM | POA: Diagnosis not present

## 2023-12-28 DIAGNOSIS — Z79899 Other long term (current) drug therapy: Secondary | ICD-10-CM

## 2023-12-28 DIAGNOSIS — I10 Essential (primary) hypertension: Secondary | ICD-10-CM | POA: Diagnosis not present

## 2023-12-28 DIAGNOSIS — G4733 Obstructive sleep apnea (adult) (pediatric): Secondary | ICD-10-CM

## 2023-12-28 DIAGNOSIS — N184 Chronic kidney disease, stage 4 (severe): Secondary | ICD-10-CM

## 2023-12-28 DIAGNOSIS — E118 Type 2 diabetes mellitus with unspecified complications: Secondary | ICD-10-CM

## 2023-12-28 DIAGNOSIS — I350 Nonrheumatic aortic (valve) stenosis: Secondary | ICD-10-CM | POA: Diagnosis not present

## 2023-12-28 DIAGNOSIS — R079 Chest pain, unspecified: Secondary | ICD-10-CM

## 2023-12-28 DIAGNOSIS — Z01812 Encounter for preprocedural laboratory examination: Secondary | ICD-10-CM

## 2023-12-28 LAB — CBC WITH DIFFERENTIAL/PLATELET
Abs Immature Granulocytes: 0.01 K/uL (ref 0.00–0.07)
Basophils Absolute: 0.1 K/uL (ref 0.0–0.1)
Basophils Relative: 1 %
Eosinophils Absolute: 0.4 K/uL (ref 0.0–0.5)
Eosinophils Relative: 6 %
HCT: 41.1 % (ref 39.0–52.0)
Hemoglobin: 13.5 g/dL (ref 13.0–17.0)
Immature Granulocytes: 0 %
Lymphocytes Relative: 24 %
Lymphs Abs: 1.5 K/uL (ref 0.7–4.0)
MCH: 29.5 pg (ref 26.0–34.0)
MCHC: 32.8 g/dL (ref 30.0–36.0)
MCV: 89.9 fL (ref 80.0–100.0)
Monocytes Absolute: 0.5 K/uL (ref 0.1–1.0)
Monocytes Relative: 8 %
Neutro Abs: 3.8 K/uL (ref 1.7–7.7)
Neutrophils Relative %: 61 %
Platelets: 177 K/uL (ref 150–400)
RBC: 4.57 MIL/uL (ref 4.22–5.81)
RDW: 14.1 % (ref 11.5–15.5)
WBC: 6.2 K/uL (ref 4.0–10.5)
nRBC: 0 % (ref 0.0–0.2)

## 2023-12-28 LAB — ALT: ALT: 32 U/L (ref 0–44)

## 2023-12-28 LAB — ECHOCARDIOGRAM COMPLETE
AR max vel: 1.05 cm2
AV Area VTI: 1.01 cm2
AV Area mean vel: 1.03 cm2
AV Mean grad: 36.5 mmHg
AV Peak grad: 63.3 mmHg
Ao pk vel: 3.98 m/s
Area-P 1/2: 3.46 cm2
Height: 65 in
S' Lateral: 2.6 cm
Weight: 3027.2 [oz_av]

## 2023-12-28 LAB — BASIC METABOLIC PANEL WITH GFR
Anion gap: 11 (ref 5–15)
BUN: 44 mg/dL — ABNORMAL HIGH (ref 8–23)
CO2: 25 mmol/L (ref 22–32)
Calcium: 9.4 mg/dL (ref 8.9–10.3)
Chloride: 100 mmol/L (ref 98–111)
Creatinine, Ser: 2.9 mg/dL — ABNORMAL HIGH (ref 0.61–1.24)
GFR, Estimated: 20 mL/min — ABNORMAL LOW (ref >60)
Glucose, Bld: 166 mg/dL — ABNORMAL HIGH (ref 70–99)
Potassium: 5 mmol/L (ref 3.5–5.1)
Sodium: 136 mmol/L (ref 135–145)

## 2023-12-28 LAB — LIPID PANEL
Cholesterol: 112 mg/dL (ref 0–200)
HDL: 58 mg/dL (ref >40)
LDL Cholesterol: 40 mg/dL (ref 0–99)
Total CHOL/HDL Ratio: 1.9 ratio
Triglycerides: 70 mg/dL (ref <150)
VLDL: 14 mg/dL (ref 0–40)

## 2023-12-28 LAB — TROPONIN I (HIGH SENSITIVITY): Troponin I (High Sensitivity): 24 ng/L — ABNORMAL HIGH (ref <18)

## 2023-12-28 NOTE — Progress Notes (Signed)
 Cardiology Office Note:  .   Date:  12/28/2023  ID:  Marco Acevedo, DOB 05-Aug-1934, MRN [id number] PCP: Okey Carlin Redbird, MD  [facility] Providers Cardiologist:  Oneil Parchment, MD     History of Present Illness: .   Marco Acevedo is a 89 y.o. male with coronary artery disease and aortic stenosis.He has a history of coronary artery disease with LHC 2020 revealing a 55% prox LAD and 85% D1. Medical management was recommended. He also has moderate to severe aortic stenosis, with a mean gradient of 25 mmHg on echocardiogram in November 2024.  He experienced a hypertensive episode during a [place] outing at high altitude, where his blood pressure exceeded 200 mmHg, leading to epistaxis. He was taken to a hospital in [place], where his blood pressure was managed and he was discharged without admission. He has been managing his blood pressure by adjusting his medication regimen, including stopping carvedilol , which he notes has resulted in more stable blood pressure readings.  He has chronic kidney disease stage 3B, with a creatinine level of 2.5 mg/dL. His prior LDL was 56 mg/dL, and his J8r was 2.7%. He is on Farxiga  10 mg for diabetes management.  He called in yesterday complaining of chest pain off and on for 2 to 3 days. It is at a specific spot in the left upper chest and was a 4/10 and resolves within 15 minutes.   He cannot describe the pain.  He can go to the gym and it does not bother him. He says that if he eats something then and then goes to exert himself it will happen.   It lasts a few minutes and then it resolves on its own.  He did take some sublingual nitroglycerin  but it did not change his pain.  He was able to go golf yesterday without any pain.  But then when he got home had another episode of chest pain.  He denies any associated diaphoresis/SOB/nausea or radiation.   Studies Reviewed: SABRA    EKG Interpretation Date/Time:  Friday December 28 2023 11:02:13 EDT Ventricular  Rate:  60 PR Interval:  168 QRS Duration:  84 QT Interval:  410 QTC Calculation: 410 R Axis:   -20  Text Interpretation: Normal sinus rhythm When compared with ECG of 23-Aug-2023 09:42, No significant change was found Confirmed by Shlomo Corning ([id number]) on 12/28/2023 11:15:48 AM    Results LABS LDL: 56 mg/dL J8r: 2.7% Creatinine: 2.5 mg/dL  DIAGNOSTIC Left heart catheterization: Proximal LAD 55%, first diagonal 85% with recommendation of medical management (02/2019) Echocardiogram: Mean gradient 25 mmHg (02/2023) Carotid Doppler: Right carotid artery plaque EKG: Normal (07/28/2023)  Risk Assessment/Calculations:           Physical Exam:   VS:  BP 134/68   Pulse 60   Ht 5' 5 (1.651 m)   Wt 189 lb 3.2 oz (85.8 kg)   SpO2 97%   BMI 31.48 kg/m    Wt Readings from Last 3 Encounters:  12/28/23 189 lb 3.2 oz (85.8 kg)  08/23/23 180 lb 12.8 oz (82 kg)  03/28/23 193 lb 3.2 oz (87.6 kg)    GEN: Well nourished, well developed in no acute distress HEENT: Normal NECK: No JVD; No carotid bruits LYMPHATICS: No lymphadenopathy CARDIAC:RRR, no  rubs, gallops.  2/6 late peaking SM at RUSB to LUSB with loss of A2 component RESPIRATORY:  Clear to auscultation without rales, wheezing or rhonchi  ABDOMEN: Soft, non-tender, non-distended MUSCULOSKELETAL:  No edema;  No deformity  SKIN: Warm and dry NEUROLOGIC:  Alert and oriented x 3 PSYCHIATRIC:  Normal affect  ASSESSMENT AND PLAN: .     Assessment & Plan Aortic Stenosis Moderate to severe aortic stenosis with a mean gradient of 25 mmHg, DI 0.27, V-max 3.28 cm/s and AVA 1.23 cm by VTI On echocardiogram in November 2024. Will repeat 2D echo to make sure the aortic stenosis has not progressed further and resulting in chest pain Will get into structural heart clinic  Coronary Artery Disease Chest pain Cath 2020 showed 55% proximal LAD and 85% D1 >> medical therapy was recommended  Now having chest pain.  Unclear whether this is  related to progression of aortic stenosis or possibly progression of CAD. I am concerned that he may have had progression of his CAD or aortic stenosis and would ultimately benefit from right and left heart cath but unfortunately he has got CKD stage IIIb with a serum creatinine of 2.53  EKG is nonischemic today We will check a high-sensitivity troponin I discussed the case with Dr. Wonda with structural heart.  He agrees patient did undergo a right and left heart cath despite CKD.  They will use limited contrast with no LV gram.  We will set him up for next week with Dr. Wonda Informed Consent   Shared Decision Making/Informed Consent The risks [stroke (1 in 1000), death (1 in 1000), kidney failure [usually temporary] (1 in 500), bleeding (1 in 200), allergic reaction [possibly serious] (1 in 200)], benefits (diagnostic support and management of coronary artery disease) and alternatives of a cardiac catheterization were discussed in detail with Marco Acevedo and he is willing to proceed. Patient does understand that with his CKD 3B that he is at a higher risk than normal of worsening renal function and possible need for transient HD and is willing to accept that risk Repeat 2D echo for reassessment of LV function and AAS continue aspirin  81 mg daily, atorvastatin  80 mg Daily, carvedilol  3.125 mg twice daily, Plavix  75 mg daily He is on Imdur  120 mg daily which I will keep him on for now but if he is found to have severe aortic stenosis would stop this I have asked him to hold his Farxiga  for preparation for heart cath  Hyperlipidemia LDL goal less than 70 Continue atorvastatin  80 mg daily with as needed refills Check FLP and ALT  Hypertension BP controlled on exam today  but he brought in BP readings from home and has been elevated anywhere from 118-167/60-70's. His PCP tried uptitrating his meds but then he got hypotensive Continue amlodipine  2.5 mg daily, carvedilol  3.125 mg twice daily with  as needed refills  Chronic Kidney Disease, Stage 3B Baseline creatinine level of 2.5 mg/dL.  Encouraged avoidance of NSAIDs advised.  Diabetes mellitus  A1c of 7.2%. Currently on  Farxiga  10 mg daily.  Obstructive sleep apnea  OSA managed with CPAP therapy  continue CPAP therapy. Will try to get a download on his device  I spent 45 minutes caring for this patient today face to face, ordering and reviewing labs, reviewing records from 2D echo 11/12024 , seeing the patient, documenting in the record, and arranging for a right and left heart cath, labs and 2D echo  Dispo: Dr. Jeffrie after cath  Signed, Wilbert Bihari, MD

## 2023-12-28 NOTE — H&P (View-Only) (Signed)
 Cardiology Office Note:  .   Date:  12/28/2023  ID:  Marco Acevedo, DOB 05-Aug-1934, MRN [id number] PCP: Marco Carlin Redbird, MD  [facility] Providers Cardiologist:  Oneil Parchment, MD     History of Present Illness: .   Marco Acevedo is a 89 y.o. male with coronary artery disease and aortic stenosis.He has a history of coronary artery disease with LHC 2020 revealing a 55% prox LAD and 85% D1. Medical management was recommended. He also has moderate to severe aortic stenosis, with a mean gradient of 25 mmHg on echocardiogram in November 2024.  He experienced a hypertensive episode during a [place] outing at [place], where his blood pressure exceeded 200 mmHg, leading to epistaxis. He was taken to a hospital in [place], where his blood pressure was managed and he was discharged without admission. He has been managing his blood pressure by adjusting his medication regimen, including stopping carvedilol , which he notes has resulted in more stable blood pressure readings.  He has chronic kidney disease stage 3B, with a creatinine level of 2.5 mg/dL. His prior LDL was 56 mg/dL, and his J8r was 2.7%. He is on Farxiga  10 mg for diabetes management.  He called in yesterday complaining of chest pain off and on for 2 to 3 days. It is at a specific spot in the left upper chest and was a 4/10 and resolves within 15 minutes.   He cannot describe the pain.  He can go to the gym and it does not bother him. He says that if he eats something then and then goes to exert himself it will happen.   It lasts a few minutes and then it resolves on its own.  He did take some sublingual nitroglycerin  but it did not change his pain.  He was able to go golf yesterday without any pain.  But then when he got home had another episode of chest pain.  He denies any associated diaphoresis/SOB/nausea or radiation.   Studies Reviewed: SABRA    EKG Interpretation Date/Time:  Friday December 28 2023 11:02:13 EDT Ventricular  Rate:  60 PR Interval:  168 QRS Duration:  84 QT Interval:  410 QTC Calculation: 410 R Axis:   -20  Text Interpretation: Normal sinus rhythm When compared with ECG of 23-Aug-2023 09:42, No significant change was found Confirmed by Shlomo Corning ([id number]) on 12/28/2023 11:15:48 AM    Results LABS LDL: 56 mg/dL J8r: 2.7% Creatinine: 2.5 mg/dL  DIAGNOSTIC Left heart catheterization: Proximal LAD 55%, first diagonal 85% with recommendation of medical management (02/2019) Echocardiogram: Mean gradient 25 mmHg (02/2023) Carotid Doppler: Right carotid artery plaque EKG: Normal (07/28/2023)  Risk Assessment/Calculations:           Physical Exam:   VS:  BP 134/68   Pulse 60   Ht 5' 5 (1.651 m)   Wt 189 lb 3.2 oz (85.8 kg)   SpO2 97%   BMI 31.48 kg/m    Wt Readings from Last 3 Encounters:  12/28/23 189 lb 3.2 oz (85.8 kg)  08/23/23 180 lb 12.8 oz (82 kg)  03/28/23 193 lb 3.2 oz (87.6 kg)    GEN: Well nourished, well developed in no acute distress HEENT: Normal NECK: No JVD; No carotid bruits LYMPHATICS: No lymphadenopathy CARDIAC:RRR, no  rubs, gallops.  2/6 late peaking SM at RUSB to LUSB with loss of A2 component RESPIRATORY:  Clear to auscultation without rales, wheezing or rhonchi  ABDOMEN: Soft, non-tender, non-distended MUSCULOSKELETAL:  No edema;  No deformity  SKIN: Warm and dry NEUROLOGIC:  Alert and oriented x 3 PSYCHIATRIC:  Normal affect  ASSESSMENT AND PLAN: .     Assessment & Plan Aortic Stenosis Moderate to severe aortic stenosis with a mean gradient of 25 mmHg, DI 0.27, V-max 3.28 cm/s and AVA 1.23 cm by VTI On echocardiogram in November 2024. Will repeat 2D echo to make sure the aortic stenosis has not progressed further and resulting in chest pain Will get into structural heart clinic  Coronary Artery Disease Chest pain Cath 2020 showed 55% proximal LAD and 85% D1 >> medical therapy was recommended  Now having chest pain.  Unclear whether this is  related to progression of aortic stenosis or possibly progression of CAD. I am concerned that he may have had progression of his CAD or aortic stenosis and would ultimately benefit from right and left heart cath but unfortunately he has got CKD stage IIIb with a serum creatinine of 2.53  EKG is nonischemic today We will check a high-sensitivity troponin I discussed the case with Dr. Wonda with structural heart.  He agrees patient did undergo a right and left heart cath despite CKD.  They will use limited contrast with no LV gram.  We will set him up for next week with Dr. Wonda Informed Consent   Shared Decision Making/Informed Consent The risks [stroke (1 in 1000), death (1 in 1000), kidney failure [usually temporary] (1 in 500), bleeding (1 in 200), allergic reaction [possibly serious] (1 in 200)], benefits (diagnostic support and management of coronary artery disease) and alternatives of a cardiac catheterization were discussed in detail with Mr. [name] and he is willing to proceed. Patient does understand that with his CKD 3B that he is at a higher risk than normal of worsening renal function and possible need for transient HD and is willing to accept that risk Repeat 2D echo for reassessment of LV function and AAS continue aspirin  81 mg daily, atorvastatin  80 mg Daily, carvedilol  3.125 mg twice daily, Plavix  75 mg daily He is on Imdur  120 mg daily which I will keep him on for now but if he is found to have severe aortic stenosis would stop this I have asked him to hold his Farxiga  for preparation for heart cath  Hyperlipidemia LDL goal less than 70 Continue atorvastatin  80 mg daily with as needed refills Check FLP and ALT  Hypertension BP controlled on exam today  but he brought in BP readings from home and has been elevated anywhere from 118-167/60-70's. His PCP tried uptitrating his meds but then he got hypotensive Continue amlodipine  2.5 mg daily, carvedilol  3.125 mg twice daily with  as needed refills  Chronic Kidney Disease, Stage 3B Baseline creatinine level of 2.5 mg/dL.  Encouraged avoidance of NSAIDs advised.  Diabetes mellitus  A1c of 7.2%. Currently on  Farxiga  10 mg daily.  Obstructive sleep apnea  OSA managed with CPAP therapy  continue CPAP therapy. Will try to get a download on his device  I spent 45 minutes caring for this patient today face to face, ordering and reviewing labs, reviewing records from 2D echo 11/12024 , seeing the patient, documenting in the record, and arranging for a right and left heart cath, labs and 2D echo  Dispo: Dr. Jeffrie after cath  Signed, Wilbert Bihari, MD

## 2023-12-28 NOTE — Patient Instructions (Signed)
 Medication Instructions:  Please STOP Farxiga .   *If you need a refill on your cardiac medications before your next appointment, please call your pharmacy*  Lab Work: Please complete a troponin TODAY at the [facility] Clinical Lab.   Please complete a CBC, FASTING lipid panel and an ALT as soon as you are able at either the [facility] Clinical Lab or any [facility].  If you have labs (blood work) drawn today and your tests are completely normal, you will receive your results only by: MyChart Message (if you have MyChart) OR A paper copy in the mail If you have any lab test that is abnormal or we need to change your treatment, we will call you to review the results.  Testing/Procedures: Your physician has requested that you have an echocardiogram. Echocardiography is a painless test that uses sound waves to create images of your heart. It provides your doctor with information about the size and shape of your heart and how well your heart's chambers and valves are working. This procedure takes approximately one hour. There are no restrictions for this procedure. Please do NOT wear cologne, perfume, aftershave, or lotions (deodorant is allowed). Please arrive 15 minutes prior to your appointment time.  Please note: We ask at that you not bring children with you during ultrasound (echo/ vascular) testing. Due to room size and safety concerns, children are not allowed in the ultrasound rooms during exams. Our front office staff cannot provide observation of children in our lobby area while testing is being conducted. An adult accompanying a patient to their appointment will only be allowed in the ultrasound room at the discretion of the ultrasound technician under special circumstances. We apologize for any inconvenience.   Please schedule patient for a right and left heart cath (cors only) with Dr. Wonda next week or as soon as possible.       Follow-Up: At [facility], you and your  health needs are our priority.  As part of our continuing mission to provide you with exceptional heart care, our providers are all part of one team.  This team includes your primary Cardiologist (physician) and Advanced Practice Providers or APPs (Physician Assistants and Nurse Practitioners) who all work together to provide you with the care you need, when you need it.  Your next appointment:   1 year(s)  Provider:   Dr. Wilbert Bihari, MD   We recommend signing up for the patient portal called MyChart.  Sign up information is provided on this After Visit Summary.  MyChart is used to connect with patients for Virtual Visits (Telemedicine).  Patients are able to view lab/test results, encounter notes, upcoming appointments, etc.  Non-urgent messages can be sent to your provider as well.   To learn more about what you can do with MyChart, go to [email or web address].          Cardiac/Peripheral Catheterization   You are scheduled for a Cardiac Catheterization on Monday, September 29 with Dr. Ozell Wonda.  1. Please arrive at the [facility] (Main Entrance A) at [facility]: [address] [place], [place] [address] at 7:00 AM (This time is 2 hour(s) before your procedure to ensure your preparation).   Free valet parking service is available. You will check in at ADMITTING. The support person will be asked to wait in the waiting room.  It is OK to have someone drop you off and come back when you are ready to be discharged.  Special note: Every effort is made to have your procedure done on time. Please understand that emergencies sometimes delay scheduled procedures.  2. Diet: Nothing to eat after midnight.  3. Hydration:You need to be well hydrated before your procedure. On September 29, you may drink approved liquids (see below) until 2 hours before the procedure, with 16 oz of water as your last intake.   List of approved liquids water, clear juice, clear tea, black  coffee, fruit juices, non-citric and without pulp, carbonated beverages, Gatorade, Kool -Aid, plain Jello-O and plain ice popsicles.  4. Labs: You will need to have blood drawn today.  5. Medication instructions in preparation for your procedure:     On the morning of your procedure, take Aspirin  81 mg and Plavix /Clopidogrel  and any morning medicines NOT listed above.  You may use sips of water.  6. Plan to go home the same day, you will only stay overnight if medically necessary. 7. You MUST have a responsible adult to drive you home. 8. An adult MUST be with you the first 24 hours after you arrive home. 9. Bring a current list of your medications, and the last time and date medication taken. 10. Bring ID and current insurance cards. 11.Please wear clothes that are easy to get on and off and wear slip-on shoes.  Thank you for allowing us  to care for you!   -- [facility]

## 2023-12-29 ENCOUNTER — Encounter (HOSPITAL_BASED_OUTPATIENT_CLINIC_OR_DEPARTMENT_OTHER): Payer: Self-pay | Admitting: Cardiology

## 2023-12-30 ENCOUNTER — Ambulatory Visit: Payer: Self-pay | Admitting: Cardiology

## 2023-12-30 DIAGNOSIS — Z01812 Encounter for preprocedural laboratory examination: Secondary | ICD-10-CM

## 2023-12-31 NOTE — Telephone Encounter (Signed)
 Call to patient to advise that Lipids, ALT and CBC are normal. Patient verbalizes understanding that SCr has bumped. Patient confirms Farxiga  was stopped at last visit. Patient agrees to repeat BMETon Friday 9/26.

## 2023-12-31 NOTE — Telephone Encounter (Signed)
-----   Message from Wilbert Bihari sent at 12/30/2023  4:24 PM EDT ----- Lipids, ALT and CBC are normal.  SCr has bumped - Jardiance was stopped.  Please repeat BMETon Friday 9/26 ----- Message ----- From: Rebecka, Lab In [place] Sent: 12/28/2023   1:19 PM EDT To: Wilbert JONELLE Bihari, MD

## 2024-01-01 ENCOUNTER — Other Ambulatory Visit: Payer: Self-pay | Admitting: *Deleted

## 2024-01-01 ENCOUNTER — Telehealth: Payer: Self-pay | Admitting: *Deleted

## 2024-01-01 DIAGNOSIS — I2510 Atherosclerotic heart disease of native coronary artery without angina pectoris: Secondary | ICD-10-CM | POA: Diagnosis not present

## 2024-01-01 DIAGNOSIS — M109 Gout, unspecified: Secondary | ICD-10-CM | POA: Diagnosis not present

## 2024-01-01 DIAGNOSIS — R251 Tremor, unspecified: Secondary | ICD-10-CM | POA: Diagnosis not present

## 2024-01-01 DIAGNOSIS — N400 Enlarged prostate without lower urinary tract symptoms: Secondary | ICD-10-CM | POA: Diagnosis not present

## 2024-01-01 DIAGNOSIS — I5032 Chronic diastolic (congestive) heart failure: Secondary | ICD-10-CM | POA: Diagnosis not present

## 2024-01-01 DIAGNOSIS — I10 Essential (primary) hypertension: Secondary | ICD-10-CM | POA: Diagnosis not present

## 2024-01-01 DIAGNOSIS — Z01812 Encounter for preprocedural laboratory examination: Secondary | ICD-10-CM

## 2024-01-01 DIAGNOSIS — E1140 Type 2 diabetes mellitus with diabetic neuropathy, unspecified: Secondary | ICD-10-CM | POA: Diagnosis not present

## 2024-01-01 DIAGNOSIS — N184 Chronic kidney disease, stage 4 (severe): Secondary | ICD-10-CM

## 2024-01-01 DIAGNOSIS — K219 Gastro-esophageal reflux disease without esophagitis: Secondary | ICD-10-CM | POA: Diagnosis not present

## 2024-01-01 DIAGNOSIS — Z0000 Encounter for general adult medical examination without abnormal findings: Secondary | ICD-10-CM | POA: Diagnosis not present

## 2024-01-01 DIAGNOSIS — Z23 Encounter for immunization: Secondary | ICD-10-CM | POA: Diagnosis not present

## 2024-01-01 DIAGNOSIS — E7800 Pure hypercholesterolemia, unspecified: Secondary | ICD-10-CM | POA: Diagnosis not present

## 2024-01-01 NOTE — Telephone Encounter (Signed)
 Cardiac Catheterization scheduled at [facility] for: Monday January 07, 2024 12 Noon Arrival time [facility] Main Entrance A at: 7 AM-pre-procedure hydration  Diet: -May have light meal until 6 AM. (6 hours before procedure time) Approved light meal consists of plain toast, fruit, light soups, crackers.  Hydration: -May drink clear liquids until 2 hours before the procedure.  Approved liquids: Water, clear tea, black coffee, fruit juices-non-citric and without pulp,Gatorade, plain Jello/popsicles.  No PO hydration (16 oz water)-going in early for IVF  Medication instructions: -Usual morning medications can be taken including aspirin  81 mg and Plavix  75 mg  Plan to go home the same day, you will only stay overnight if medically necessary.  You must have responsible adult to drive you home.  Someone must be with you the first 24 hours after you arrive home.  Reviewed procedure instructions/discussed pre-procedure hydration with patient.

## 2024-01-02 ENCOUNTER — Telehealth: Payer: Self-pay | Admitting: Cardiology

## 2024-01-02 ENCOUNTER — Ambulatory Visit: Payer: Self-pay | Admitting: Cardiology

## 2024-01-02 LAB — BASIC METABOLIC PANEL WITH GFR
BUN/Creatinine Ratio: 17 (ref 10–24)
BUN: 45 mg/dL — ABNORMAL HIGH (ref 8–27)
CO2: 21 mmol/L (ref 20–29)
Calcium: 9.5 mg/dL (ref 8.6–10.2)
Chloride: 101 mmol/L (ref 96–106)
Creatinine, Ser: 2.64 mg/dL — ABNORMAL HIGH (ref 0.76–1.27)
Glucose: 155 mg/dL — ABNORMAL HIGH (ref 70–99)
Potassium: 4.6 mmol/L (ref 3.5–5.2)
Sodium: 138 mmol/L (ref 134–144)
eGFR: 23 mL/min/1.73 — ABNORMAL LOW (ref >59)

## 2024-01-02 LAB — CBC WITH DIFFERENTIAL/PLATELET
Basophils Absolute: 0.1 x10E3/uL (ref 0.0–0.2)
Basos: 1 %
EOS (ABSOLUTE): 0.4 x10E3/uL (ref 0.0–0.4)
Eos: 7 %
Hematocrit: 41.8 % (ref 37.5–51.0)
Hemoglobin: 13.8 g/dL (ref 13.0–17.7)
Immature Grans (Abs): 0 x10E3/uL (ref 0.0–0.1)
Immature Granulocytes: 0 %
Lymphocytes Absolute: 1 x10E3/uL (ref 0.7–3.1)
Lymphs: 16 %
MCH: 29.8 pg (ref 26.6–33.0)
MCHC: 33 g/dL (ref 31.5–35.7)
MCV: 90 fL (ref 79–97)
Monocytes Absolute: 0.5 x10E3/uL (ref 0.1–0.9)
Monocytes: 8 %
Neutrophils Absolute: 4.1 x10E3/uL (ref 1.4–7.0)
Neutrophils: 68 %
Platelets: 164 x10E3/uL (ref 150–450)
RBC: 4.63 x10E6/uL (ref 4.14–5.80)
RDW: 13.8 % (ref 11.6–15.4)
WBC: 6.1 x10E3/uL (ref 3.4–10.8)

## 2024-01-02 NOTE — Telephone Encounter (Signed)
 Patient calling to follow up on echo results, states he would like to know the results ASAP. Advised him once they are read then the nurse will give a call to go over the results. FYI

## 2024-01-02 NOTE — Telephone Encounter (Signed)
 Left message for patient , according to the notes in the chart, the patient was called today by dr jeffrie. He is to call back if he still has questions.

## 2024-01-03 ENCOUNTER — Ambulatory Visit: Payer: Self-pay | Admitting: Cardiology

## 2024-01-03 LAB — ALT: ALT: 24 IU/L (ref 0–44)

## 2024-01-04 ENCOUNTER — Ambulatory Visit: Payer: Self-pay | Admitting: Cardiology

## 2024-01-07 ENCOUNTER — Other Ambulatory Visit: Payer: Self-pay

## 2024-01-07 ENCOUNTER — Encounter (HOSPITAL_COMMUNITY)
Admission: RE | Disposition: A | Discharge status: Home / Self Care | Payer: Self-pay | Source: Home / Self Care | Attending: Cardiovascular Disease

## 2024-01-07 ENCOUNTER — Ambulatory Visit (HOSPITAL_COMMUNITY)
Admission: RE | Admit: 2024-01-07 | Discharge: 2024-01-07 | Disposition: A | Discharge status: Home / Self Care | Payer: Medicare (Managed Care) | Attending: Cardiovascular Disease | Admitting: Cardiovascular Disease

## 2024-01-07 DIAGNOSIS — I2510 Atherosclerotic heart disease of native coronary artery without angina pectoris: Secondary | ICD-10-CM

## 2024-01-07 DIAGNOSIS — I350 Nonrheumatic aortic (valve) stenosis: Secondary | ICD-10-CM | POA: Diagnosis not present

## 2024-01-07 DIAGNOSIS — Z7902 Long term (current) use of antithrombotics/antiplatelets: Secondary | ICD-10-CM | POA: Insufficient documentation

## 2024-01-07 DIAGNOSIS — Z955 Presence of coronary angioplasty implant and graft: Secondary | ICD-10-CM | POA: Diagnosis not present

## 2024-01-07 DIAGNOSIS — Z7982 Long term (current) use of aspirin: Secondary | ICD-10-CM | POA: Diagnosis not present

## 2024-01-07 DIAGNOSIS — E785 Hyperlipidemia, unspecified: Secondary | ICD-10-CM | POA: Insufficient documentation

## 2024-01-07 DIAGNOSIS — I25110 Atherosclerotic heart disease of native coronary artery with unstable angina pectoris: Secondary | ICD-10-CM | POA: Diagnosis not present

## 2024-01-07 DIAGNOSIS — I129 Hypertensive chronic kidney disease with stage 1 through stage 4 chronic kidney disease, or unspecified chronic kidney disease: Secondary | ICD-10-CM | POA: Diagnosis not present

## 2024-01-07 DIAGNOSIS — N1832 Chronic kidney disease, stage 3b: Secondary | ICD-10-CM | POA: Insufficient documentation

## 2024-01-07 DIAGNOSIS — E1122 Type 2 diabetes mellitus with diabetic chronic kidney disease: Secondary | ICD-10-CM | POA: Diagnosis not present

## 2024-01-07 DIAGNOSIS — Z7984 Long term (current) use of oral hypoglycemic drugs: Secondary | ICD-10-CM | POA: Insufficient documentation

## 2024-01-07 DIAGNOSIS — G4733 Obstructive sleep apnea (adult) (pediatric): Secondary | ICD-10-CM | POA: Diagnosis not present

## 2024-01-07 DIAGNOSIS — Z79899 Other long term (current) drug therapy: Secondary | ICD-10-CM | POA: Diagnosis not present

## 2024-01-07 HISTORY — PX: RIGHT/LEFT HEART CATH AND CORONARY ANGIOGRAPHY: CATH118266

## 2024-01-07 LAB — POCT I-STAT 7, (LYTES, BLD GAS, ICA,H+H)
Acid-base deficit: 2 mmol/L (ref 0.0–2.0)
Bicarbonate: 24.2 mmol/L (ref 20.0–28.0)
Calcium, Ion: 1.25 mmol/L (ref 1.15–1.40)
HCT: 37 % — ABNORMAL LOW (ref 39.0–52.0)
Hemoglobin: 12.6 g/dL — ABNORMAL LOW (ref 13.0–17.0)
O2 Saturation: 89 %
Potassium: 3.9 mmol/L (ref 3.5–5.1)
Sodium: 141 mmol/L (ref 135–145)
TCO2: 26 mmol/L (ref 22–32)
pCO2 arterial: 47.7 mmHg (ref 32–48)
pH, Arterial: 7.314 — ABNORMAL LOW (ref 7.35–7.45)
pO2, Arterial: 63 mmHg — ABNORMAL LOW (ref 83–108)

## 2024-01-07 LAB — POCT I-STAT EG7
Acid-base deficit: 2 mmol/L (ref 0.0–2.0)
Bicarbonate: 24.8 mmol/L (ref 20.0–28.0)
Calcium, Ion: 1.25 mmol/L (ref 1.15–1.40)
HCT: 37 % — ABNORMAL LOW (ref 39.0–52.0)
Hemoglobin: 12.6 g/dL — ABNORMAL LOW (ref 13.0–17.0)
O2 Saturation: 65 %
Potassium: 3.9 mmol/L (ref 3.5–5.1)
Sodium: 142 mmol/L (ref 135–145)
TCO2: 26 mmol/L (ref 22–32)
pCO2, Ven: 50.7 mmHg (ref 44–60)
pH, Ven: 7.297 (ref 7.25–7.43)
pO2, Ven: 38 mmHg (ref 32–45)

## 2024-01-07 LAB — GLUCOSE, CAPILLARY
Glucose-Capillary: 174 mg/dL — ABNORMAL HIGH (ref 70–99)
Glucose-Capillary: 157 mg/dL — ABNORMAL HIGH (ref 70–99)

## 2024-01-07 LAB — POCT ACTIVATED CLOTTING TIME
Activated Clotting Time: 135 s
Activated Clotting Time: 152 s

## 2024-01-07 SURGERY — RIGHT/LEFT HEART CATH AND CORONARY ANGIOGRAPHY
Anesthesia: LOCAL

## 2024-01-07 MED ORDER — SODIUM CHLORIDE 0.9 % WEIGHT BASED INFUSION
1.0000 mL/kg/h | INTRAVENOUS | Status: DC
Start: 2024-01-07 — End: 2024-01-07

## 2024-01-07 MED ORDER — CLOPIDOGREL BISULFATE 75 MG PO TABS
75.0000 mg | ORAL_TABLET | ORAL | Status: DC
Start: 2024-01-07 — End: 2024-01-07

## 2024-01-07 MED ORDER — HEPARIN SODIUM (PORCINE) 1000 UNIT/ML IJ SOLN
INTRAMUSCULAR | Status: DC | PRN
  Administered 2024-01-07: 5000 [IU] via INTRAVENOUS

## 2024-01-07 MED ORDER — HEPARIN SODIUM (PORCINE) 1000 UNIT/ML IJ SOLN
INTRAMUSCULAR | Status: AC
  Filled 2024-01-07: qty 10

## 2024-01-07 MED ORDER — LABETALOL HCL 5 MG/ML IV SOLN
INTRAVENOUS | Status: AC
  Filled 2024-01-07: qty 4

## 2024-01-07 MED ORDER — FENTANYL CITRATE (PF) 100 MCG/2ML IJ SOLN
INTRAMUSCULAR | Status: DC | PRN
  Administered 2024-01-07 (×2): 25 ug via INTRAVENOUS

## 2024-01-07 MED ORDER — HYDRALAZINE HCL 20 MG/ML IJ SOLN: 10.0000 mg | INTRAMUSCULAR | Status: DC | PRN

## 2024-01-07 MED ORDER — ACETAMINOPHEN 325 MG PO TABS: 650.0000 mg | ORAL_TABLET | ORAL | Status: DC | PRN

## 2024-01-07 MED ORDER — IOHEXOL 350 MG/ML SOLN
INTRAVENOUS | Status: DC | PRN
  Administered 2024-01-07: 20 mL

## 2024-01-07 MED ORDER — LIDOCAINE HCL (PF) 1 % IJ SOLN
INTRAMUSCULAR | Status: AC
  Filled 2024-01-07: qty 30

## 2024-01-07 MED ORDER — LIDOCAINE HCL (PF) 1 % IJ SOLN
INTRAMUSCULAR | Status: DC | PRN
  Administered 2024-01-07: 2 mL

## 2024-01-07 MED ORDER — VERAPAMIL HCL 2.5 MG/ML IV SOLN
INTRAVENOUS | Status: AC
  Filled 2024-01-07: qty 2

## 2024-01-07 MED ORDER — MIDAZOLAM HCL 2 MG/2ML IJ SOLN
INTRAMUSCULAR | Status: AC
  Filled 2024-01-07: qty 2

## 2024-01-07 MED ORDER — SODIUM CHLORIDE 0.9 % WEIGHT BASED INFUSION: 1.0000 mL/kg/h | INTRAVENOUS | Status: DC

## 2024-01-07 MED ORDER — HEPARIN (PORCINE) IN NACL 1000-0.9 UT/500ML-% IV SOLN
INTRAVENOUS | Status: DC | PRN
  Administered 2024-01-07: 1000 mL

## 2024-01-07 MED ORDER — ASPIRIN 81 MG PO CHEW: 81.0000 mg | CHEWABLE_TABLET | ORAL | Status: DC

## 2024-01-07 MED ORDER — SODIUM CHLORIDE 0.9 % IV SOLN: 250.0000 mL | INTRAVENOUS | Status: DC | PRN

## 2024-01-07 MED ORDER — ONDANSETRON HCL 4 MG/2ML IJ SOLN: 4.0000 mg | Freq: Four times a day (QID) | INTRAMUSCULAR | Status: DC | PRN

## 2024-01-07 MED ORDER — SODIUM CHLORIDE 0.9% FLUSH: 3.0000 mL | INTRAVENOUS | Status: DC | PRN

## 2024-01-07 MED ORDER — LABETALOL HCL 5 MG/ML IV SOLN: 10.0000 mg | INTRAVENOUS | Status: DC | PRN

## 2024-01-07 MED ORDER — VERAPAMIL HCL 2.5 MG/ML IV SOLN
INTRAVENOUS | Status: DC | PRN
  Administered 2024-01-07: 10 mL via INTRA_ARTERIAL

## 2024-01-07 MED ORDER — SODIUM CHLORIDE 0.9 % WEIGHT BASED INFUSION
3.0000 mL/kg/h | INTRAVENOUS | Status: AC
Start: 2024-01-07 — End: 2024-01-07
  Administered 2024-01-07: 3 mL/kg/h via INTRAVENOUS

## 2024-01-07 MED ORDER — MIDAZOLAM HCL 2 MG/2ML IJ SOLN
INTRAMUSCULAR | Status: DC | PRN
  Administered 2024-01-07 (×2): 1 mg via INTRAVENOUS

## 2024-01-07 MED ORDER — SODIUM CHLORIDE 0.9% FLUSH: 3.0000 mL | Freq: Two times a day (BID) | INTRAVENOUS | Status: DC

## 2024-01-07 MED ORDER — LABETALOL HCL 5 MG/ML IV SOLN
INTRAVENOUS | Status: DC | PRN
  Administered 2024-01-07: 10 mg via INTRAVENOUS

## 2024-01-07 MED ORDER — FENTANYL CITRATE (PF) 100 MCG/2ML IJ SOLN
INTRAMUSCULAR | Status: AC
  Filled 2024-01-07: qty 2

## 2024-01-07 SURGICAL SUPPLY — 11 items
CATH 5FR JL3.5 JR4 ANG PIG MP (CATHETERS) IMPLANT
CATH BALLN WEDGE 5F 110CM (CATHETERS) IMPLANT
CATH INFINITI 5FR AL1 (CATHETERS) IMPLANT
DEVICE RAD COMP TR BAND LRG (VASCULAR PRODUCTS) IMPLANT
GLIDESHEATH SLEND SS 6F .021 (SHEATH) IMPLANT
GUIDEWIRE .025 260CM (WIRE) IMPLANT
GUIDEWIRE INQWIRE 1.5J.035X260 (WIRE) IMPLANT
PACK CARDIAC CATHETERIZATION (CUSTOM PROCEDURE TRAY) ×1 IMPLANT
SET ATX-X65L (MISCELLANEOUS) IMPLANT
SHEATH GLIDE SLENDER 4/5FR (SHEATH) IMPLANT
WIRE EMERALD ST .035X260CM (WIRE) IMPLANT

## 2024-01-07 NOTE — Discharge Instructions (Signed)
Brachial Site Care   This sheet gives you information about how to care for yourself after your procedure. Your health care provider may also give you more specific instructions. If you have problems or questions, contact your health care provider. What can I expect after the procedure? After the procedure, it is common to have: Bruising and tenderness at the catheter insertion area. Follow these instructions at home:  Insertion site care Follow instructions from your health care provider about how to take care of your insertion site. Make sure you: Wash your hands with soap and water before you change your bandage (dressing). If soap and water are not available, use hand sanitizer. Remove your dressing as told by your health care provider. In 24 hours Check your insertion site every day for signs of infection. Check for: Redness, swelling, or pain. Pus or a bad smell. Warmth. You may shower 24-48 hours after the procedure. Do not apply powder or lotion to the site.  Activity For 24 hours after the procedure, or as directed by your health care provider: Do not push or pull heavy objects with the affected arm. Do not drive yourself home from the hospital or clinic. You may drive 24 hours after the procedure unless your health care provider tells you not to. Do not lift anything that is heavier than 10 lb (4.5 kg), or the limit that you are told, until your health care provider says that it is safe.  For 24 hours   Drink plenty of fluids for 48 hours and keep wrist elevated at heart level for 24 hours  Radial Site Care   This sheet gives you information about how to care for yourself after your procedure. Your health care provider may also give you more specific instructions. If you have problems or questions, contact your health care provider. What can I expect after the procedure? After the procedure, it is common to have: Bruising and tenderness at the catheter insertion  area. Follow these instructions at home: Medicines Take over-the-counter and prescription medicines only as told by your health care provider. Insertion site care Follow instructions from your health care provider about how to take care of your insertion site. Make sure you: Wash your hands with soap and water before you change your bandage (dressing). If soap and water are not available, use hand sanitizer. Remove your dressing as told by your health care provider. In 24 hours Check your insertion site every day for signs of infection. Check for: Redness, swelling, or pain. Fluid or blood. Pus or a bad smell. Warmth. Do not take baths, swim, or use a hot tub until your health care provider approves. You may shower 24-48 hours after the procedure, or as directed by your health care provider. Remove the dressing and gently wash the site with plain soap and water. Pat the area dry with a clean towel. Do not rub the site. That could cause bleeding. Do not apply powder or lotion to the site. Activity   For 24 hours after the procedure, or as directed by your health care provider: Do not flex or bend the affected arm. Do not push or pull heavy objects with the affected arm. Do not drive yourself home from the hospital or clinic. You may drive 24 hours after the procedure unless your health care provider tells you not to. Do not operate machinery or power tools. Do not lift anything that is heavier than 10 lb (4.5 kg), or the limit that you are  told, until your health care provider says that it is safe.  For 4 days Ask your health care provider when it is okay to: Return to work or school. Resume usual physical activities or sports. Resume sexual activity. General instructions If the catheter site starts to bleed, raise your arm and put firm pressure on the site. If the bleeding does not stop, get help right away. This is a medical emergency. If you went home on the same day as your  procedure, a responsible adult should be with you for the first 24 hours after you arrive home. Keep all follow-up visits as told by your health care provider. This is important. Contact a health care provider if: You have a fever. You have redness, swelling, or yellow drainage around your insertion site. Get help right away if: You have unusual pain at the radial site. The catheter insertion area swells very fast. The insertion area is bleeding, and the bleeding does not stop when you hold steady pressure on the area. Your arm or hand becomes pale, cool, tingly, or numb. These symptoms may represent a serious problem that is an emergency. Do not wait to see if the symptoms will go away. Get medical help right away. Call your local emergency services (911 in the [place] Do not drive yourself to the hospital. Summary After the procedure, it is common to have bruising and tenderness at the site. Follow instructions from your health care provider about how to take care of your radial site wound. Check the wound every day for signs of infection. Do not lift anything that is heavier than 10 lb (4.5 kg), or the limit that you are told, until your health care provider says that it is safe. This information is not intended to replace advice given to you by your health care provider. Make sure you discuss any questions you have with your health care provider. Document Revised: 05/02/2017 Document Reviewed: 05/02/2017 Elsevier Patient Education  2020 [employer].

## 2024-01-07 NOTE — Interval H&P Note (Signed)
 History and Physical Interval Note:  01/07/2024 2:19 PM  Marco Acevedo  has presented today for surgery, with the diagnosis of unstable angina.  The various methods of treatment have been discussed with the patient and family. After consideration of risks, benefits and other options for treatment, the patient has consented to  Procedure(s): RIGHT/LEFT HEART CATH AND CORONARY ANGIOGRAPHY (N/A) as a surgical intervention.  The patient's history has been reviewed, patient examined, no change in status, stable for surgery.  I have reviewed the patient's chart and labs.  Questions were answered to the patient's satisfaction.     [name]

## 2024-01-08 ENCOUNTER — Encounter (HOSPITAL_COMMUNITY): Payer: Self-pay | Admitting: Cardiovascular Disease

## 2024-01-09 ENCOUNTER — Telehealth: Payer: Self-pay | Admitting: Cardiology

## 2024-01-09 NOTE — Telephone Encounter (Signed)
 Pt c/o medication issue:  1. Name of Medication:   dapagliflozin  propanediol (FARXIGA ) 10 MG TABS tablet   2. How are you currently taking this medication (dosage and times per day)?   Currently not taking  3. Are you having a reaction (difficulty breathing--STAT)?   4. What is your medication issue?   Patient stated he was taken off this medication over a week ago and wants to know if he can be put back on this medication.

## 2024-01-09 NOTE — Telephone Encounter (Signed)
 S/w the patient and he is asking if he is supposed to resume taking the Farxiga . He had a heart Cath 01/07/24.   (Unsure from OV notes if the patient should resume or if the plan is to continue off of Farxiga )  Informed him that I would send a message to the provider for clarification  and we will call back with recommendations. He verbalized understanding.

## 2024-01-10 NOTE — Telephone Encounter (Signed)
 Spoke with pt, he reports he was started on farxiga  by dr victory sharps and it was held for the cath. Patient given the okay to restart.

## 2024-01-14 ENCOUNTER — Telehealth: Payer: Self-pay | Admitting: Pharmacy Technician

## 2024-01-14 ENCOUNTER — Ambulatory Visit: Payer: Medicare (Managed Care) | Attending: Cardiovascular Disease | Admitting: Cardiovascular Disease

## 2024-01-14 ENCOUNTER — Encounter: Payer: Self-pay | Admitting: Cardiovascular Disease

## 2024-01-14 VITALS — BP 160/78 | HR 56 | Ht 68.0 in | Wt 189.4 lb

## 2024-01-14 DIAGNOSIS — I350 Nonrheumatic aortic (valve) stenosis: Secondary | ICD-10-CM | POA: Diagnosis not present

## 2024-01-14 NOTE — Progress Notes (Signed)
 Pre Surgical Assessment: 5 M Walk Test  28M=16.45ft  5 Meter Walk Test- trial 1: 5.8 seconds 5 Meter Walk Test- trial 2: 5.71 seconds 5 Meter Walk Test- trial 3: 5.73 seconds 5 Meter Walk Test Average: 5.75 seconds

## 2024-01-14 NOTE — Progress Notes (Signed)
 [facility] Consult Note  Chief Complaint  Patient presents with   New Patient (Initial Visit)    Aortic stenosis    History of Present Illness: 89 yo male with history of anemia, DM, COPD, chronic diastolic CHF, CAD, CKD stage IV, HTN, HLD, previous stroke, carotid artery disease, sleep apnea and severe aortic stenosis who is here today as a new consult , referred by Dr. Jeffrie, for further discussion regarding his aortic stenosis and possible TAVR. He is known to have moderate CAD and moderate aortic stenosis. He had recent chest pain while hiking at high altitudes. Echo 12/28/23 with LVEF=65=70%. Normal RV function. Moderately severe aortic stenosis with mean gradient 36.5 mmHg, AVA 1.0 cm2, SVI 51, DI 0.21. Cardiac cath 01/07/24 with patent Circumflex stents with non-obstructive disease noted in the LAD, Circumflex and RCA. Severe aortic stenosis with mean gradient 37 mmHg, AVA 0.78 cm2.   He tells me today that he has some chest pressure and dyspnea with exertion but overall is very active in the gym and playing golf. No dizziness, near syncope or LE edema. He lives in [place], [place] with his wife. He is retired from the [employer]ologist, counselling business. No active dental issues.   He is followed Nephrology by Dr. Norine.   Primary Care Physician: Okey Carlin Redbird, MD Primary Cardiologist: Jeffrie Referring Cardiologist: Jeffrie  Past Medical History:  Diagnosis Date   Anemia    low iron    Aortic stenosis    Arthritis    BPH (benign prostatic hyperplasia)    CAD (coronary artery disease)    Chronic diastolic CHF (congestive heart failure) (HCC)    CKD (chronic kidney disease), stage IV (HCC)    Claustrophobia    Complication of anesthesia    has to have head elevated to keep from strangling on post nasal drip   COPD (chronic obstructive pulmonary disease) (HCC)    Diabetes mellitus type II    type 2   GERD (gastroesophageal reflux disease)    Gout    Granuloma annulare     History of hiatal hernia    History of kidney stones    Litthotrispy   Hyperlipidemia    Hypertension    Neuropathy    Obesity    OSA (obstructive sleep apnea)    Stroke (HCC) 10/2017   Weakness right arm and leg    Past Surgical History:  Procedure Laterality Date   BLEPHAROPLASTY     CARDIAC CATHETERIZATION  [id number]   X 2 stents   CHOLECYSTECTOMY N/A 09/23/2014   Procedure: LAPAROSCOPIC CHOLECYSTECTOMY WITH INTRAOPERATIVE CHOLANGIOGRAM;  Surgeon: Deward Null [name], MD;  Location: [facility] OR;  Service: General;  Laterality: N/A;   COLONOSCOPY     COLONOSCOPY WITH PROPOFOL  N/A 03/22/2016   Procedure: COLONOSCOPY WITH PROPOFOL ;  Surgeon: Jerrell Sol, MD;  Location: [facility] ENDOSCOPY;  Service: Endoscopy;  Laterality: N/A;   ENDARTERECTOMY Left 10/19/2017   Procedure: ENDARTERECTOMY CAROTID LEFT;  Surgeon: Oris Krystal FALCON, MD;  Location: [facility] OR;  Service: Vascular;  Laterality: Left;   ESOPHAGOGASTRODUODENOSCOPY (EGD) WITH PROPOFOL  N/A 03/22/2016   Procedure: ESOPHAGOGASTRODUODENOSCOPY (EGD) WITH PROPOFOL ;  Surgeon: Jerrell Sol, MD;  Location: [facility] ENDOSCOPY;  Service: Endoscopy;  Laterality: N/A;   EYE SURGERY     both cataracts   INTRAMEDULLARY (IM) NAIL INTERTROCHANTERIC Left 08/11/2022   Procedure: INTRAMEDULLARY (IM) NAIL INTERTROCHANTERIC;  Surgeon: Edna Toribio LABOR, MD;  Location: [facility] OR;  Service: Orthopedics;  Laterality: Left;   KNEE ARTHROSCOPY WITH MEDIAL MENISECTOMY Right 08/19/2013  Procedure: RIGHT KNEE ARTHROSCOPY WITH PARTIAL MEDIAL MENISECTOMY AND CHONDROPLASTY;  Surgeon: Maude KANDICE Herald, MD;  Location: [facility];  Service: Orthopedics;  Laterality: Right;   LEFT HEART CATH AND CORONARY ANGIOGRAPHY N/A 06/28/2016   Procedure: Left Heart Cath and Coronary Angiography;  Surgeon: Victory LELON Sharps, MD;  Location: [facility] INVASIVE CV LAB;  Service: Cardiovascular;  Laterality: N/A;   LEFT HEART CATH AND CORONARY ANGIOGRAPHY N/A 02/25/2019   Procedure: LEFT HEART CATH  AND CORONARY ANGIOGRAPHY;  Surgeon: Sharps Victory LELON, MD;  Location: [facility] INVASIVE CV LAB;  Service: Cardiovascular;  Laterality: N/A;   LEFT HEART CATHETERIZATION WITH CORONARY ANGIOGRAM N/A 10/03/2011   Procedure: LEFT HEART CATHETERIZATION WITH CORONARY ANGIOGRAM;  Surgeon: Victory LELON Sharps DOUGLAS, MD;  Location: [facility] CATH LAB;  Service: Cardiovascular;  Laterality: N/A;   LEFT HEART CATHETERIZATION WITH CORONARY ANGIOGRAM N/A 09/12/2012   Procedure: LEFT HEART CATHETERIZATION WITH CORONARY ANGIOGRAM;  Surgeon: Victory LELON Sharps DOUGLAS, MD;  Location: [facility] CATH LAB;  Service: Cardiovascular;  Laterality: N/A;   LIPOMA EXCISION     Biospy only; left parotid gland   LITHOTRIPSY     none     PERCUTANEOUS CORONARY STENT INTERVENTION (PCI-S) N/A 09/17/2012   Procedure: PERCUTANEOUS CORONARY STENT INTERVENTION (PCI-S);  Surgeon: Victory LELON Sharps DOUGLAS, MD;  Location: [facility] CATH LAB;  Service: Cardiovascular;  Laterality: N/A;   RIGHT/LEFT HEART CATH AND CORONARY ANGIOGRAPHY N/A 01/07/2024   Procedure: RIGHT/LEFT HEART CATH AND CORONARY ANGIOGRAPHY;  Surgeon: Wonda Sharper, MD;  Location: [facility] INVASIVE CV LAB;  Service: Cardiovascular;  Laterality: N/A;   TRIGGER FINGER RELEASE Left 12/21/2015   Procedure: LEFT LONG FINGER TRIGGER RELEASE ;  Surgeon: Alm Hummer, MD;  Location: [facility];  Service: Orthopedics;  Laterality: Left;    Current Outpatient Medications  Medication Sig Dispense Refill   albuterol  (VENTOLIN  HFA) 108 (90 Base) MCG/ACT inhaler Inhale 2 puffs into the lungs every 6 (six) hours as needed for wheezing or shortness of breath.      allopurinol  (ZYLOPRIM ) 100 MG tablet Take 100 mg by mouth daily.     amLODipine  (NORVASC ) 2.5 MG tablet Take 2.5 mg by mouth daily.     aspirin  EC 81 MG tablet Take 1 tablet (81 mg total) by mouth daily. 90 tablet 3   atorvastatin  (LIPITOR ) 80 MG tablet Take 1 tablet (80 mg total) by mouth daily at 6 PM. 30 tablet 0   capsaicin (ZOSTRIX) 0.025 % cream Apply 1  Application topically 2 (two) times daily as needed (pain).     carvedilol  (COREG ) 6.25 MG tablet Take 6.25 mg by mouth 2 (two) times daily with a meal.     cholecalciferol (VITAMIN D3) 25 MCG (1000 UT) tablet Take 1,000 Units by mouth daily.     clopidogrel  (PLAVIX ) 75 MG tablet Take 1 tablet (75 mg total) by mouth daily. 30 tablet 0   dapagliflozin  propanediol (FARXIGA ) 10 MG TABS tablet Take 10 mg by mouth daily.     Docusate Sodium  (DSS) 100 MG CAPS as needed (constipation).     Ferrous Sulfate  (IRON ) 325 (65 FE) MG TABS Take 325 mg by mouth daily.      finasteride  (PROSCAR ) 5 MG tablet Take 1 tablet (5 mg total) by mouth daily. 30 tablet 5   gabapentin  (NEURONTIN ) 300 MG capsule Take 2 capsules (600 mg total) by mouth at bedtime. 60 capsule 0   guaifenesin  (HUMIBID E) 400 MG TABS tablet Take 400 mg by mouth 2 (two)  times daily.     isosorbide  mononitrate (IMDUR ) 120 MG 24 hr tablet Take 1 tablet (120 mg total) by mouth daily. 90 tablet 3   nitroGLYCERIN  (NITROSTAT ) 0.4 MG SL tablet Place 0.4 mg under the tongue every 5 (five) minutes as needed for chest pain.     pantoprazole  (PROTONIX ) 40 MG tablet Take 40 mg by mouth daily.     primidone  (MYSOLINE ) 50 MG tablet Take 50 mg by mouth at bedtime.      sucralfate  (CARAFATE ) 1 GM/10ML suspension Take 1 g by mouth 2 (two) times daily as needed (heartburn).     traMADol  (ULTRAM ) 50 MG tablet Take 1 tablet (50 mg total) by mouth at bedtime as needed for moderate pain. (Patient taking differently: Take 100 mg by mouth at bedtime.)     vitamin B-12 (CYANOCOBALAMIN ) 1000 MCG tablet Take 1,000 mcg by mouth daily.     No current facility-administered medications for this visit.    Allergies  Allergen Reactions   Cimetidine Other (See Comments)    Anxiety Attacks   Metformin  Hcl     Other reaction(s): decreased kidney function    Social History   Socioeconomic History   Marital status: Married    Spouse name: Not on file   Number of  children: 2   Years of education: Not on file   Highest education level: Not on file  Occupational History   Occupation: retired   Occupation: Retired from [employer]  Tobacco Use   Smoking status: Former    Current packs/day: 0.00    Average packs/day: 1 pack/day for 64.0 years (64.0 ttl pk-yrs)    Types: Cigarettes    Start date: 05/29/1947    Quit date: 05/29/2011    Years since quitting: 12.6   Smokeless tobacco: Never  Vaping Use   Vaping status: Never Used  Substance and Sexual Activity   Alcohol use: Yes    Comment: rarely   Drug use: No   Sexual activity: Not Currently  Other Topics Concern   Not on file  Social History Narrative   Denies Caffeine use    Social Drivers of Corporate investment banker Strain: Not on file  Food Insecurity: No Food Insecurity (08/10/2022)   Hunger Vital Sign    Worried About Running Out of Food in the Last Year: Never true    Ran Out of Food in the Last Year: Never true  Transportation Needs: No Transportation Needs (08/10/2022)   PRAPARE - Administrator, Civil Service (Medical): No    Lack of Transportation (Non-Medical): No  Physical Activity: Not on file  Stress: Not on file  Social Connections: Not on file  Intimate Partner Violence: Not At Risk (08/10/2022)   Humiliation, Afraid, Rape, and Kick questionnaire    Fear of Current or Ex-Partner: No    Emotionally Abused: No    Physically Abused: No    Sexually Abused: No    Family History  Problem Relation Age of Onset   Aortic aneurysm Father    Aneurysm Mother        brain   Cancer Brother    COPD Sister    Other Sister        health hx unknown   Other Sister        health hx unknown   Other Brother        health hx unknown    Review of Systems:  As stated in the HPI and  otherwise negative.   BP (!) 160/78   Pulse (!) 56   Ht 5' 8 (1.727 m)   Wt 189 lb 6.4 oz (85.9 kg)   SpO2 95%   BMI 28.80 kg/m   Physical Examination: General: Well  developed, well nourished, NAD  HEENT: OP clear, mucus membranes moist  SKIN: warm, dry. No rashes. Neuro: No focal deficits  Musculoskeletal: Muscle strength 5/5 all ext  Psychiatric: Mood and affect normal  Neck: No JVD, no carotid bruits, no thyromegaly, no lymphadenopathy.  Lungs:Clear bilaterally, no wheezes, rhonci, crackles Cardiovascular: Regular rate and rhythm. Loud, harsh, late peaking systolic murmur.  Abdomen:Soft. Bowel sounds present. Non-tender.  Extremities: No lower extremity edema. Pulses are 2 + in the bilateral DP/PT.  EKG:  EKG is not ordered today. The ekg ordered today demonstrates   Echo 12/28/23: 1. Left ventricular ejection fraction, by estimation, is 65 to 70%. Left  ventricular ejection fraction by 3D volume is 65 %. The left ventricle has  normal function. The left ventricle has no regional wall motion  abnormalities. There is mild concentric  left ventricular hypertrophy. Left ventricular diastolic parameters are  consistent with Grade I diastolic dysfunction (impaired relaxation).   2. Right ventricular systolic function is normal. The right ventricular  size is normal.   3. Left atrial size was moderately dilated.   4. The mitral valve is degenerative. Trivial mitral valve regurgitation.  No evidence of mitral stenosis. Moderate mitral annular calcification.   5. The aortic valve is tricuspid. There is severe calcifcation of the  aortic valve. Aortic valve regurgitation is not visualized. Moderate to  severe aortic valve stenosis. Aortic valve area, by VTI measures 1.01 cm.  Aortic valve mean gradient measures  36.5 mmHg. Aortic valve Vmax measures 3.98 m/s.   6. The inferior vena cava is normal in size with greater than 50%  respiratory variability, suggesting right atrial pressure of 3 mmHg.   FINDINGS   Left Ventricle: Left ventricular ejection fraction, by estimation, is 65  to 70%. Left ventricular ejection fraction by 3D volume is 65 %. The  left  ventricle has normal function. The left ventricle has no regional wall  motion abnormalities. The left  ventricular internal cavity size was normal in size. There is mild  concentric left ventricular hypertrophy. Left ventricular diastolic  parameters are consistent with Grade I diastolic dysfunction (impaired  relaxation).   Right Ventricle: The right ventricular size is normal. No increase in  right ventricular wall thickness. Right ventricular systolic function is  normal.   Left Atrium: Left atrial size was moderately dilated.   Right Atrium: Right atrial size was normal in size.   Pericardium: There is no evidence of pericardial effusion.   Mitral Valve: The mitral valve is degenerative in appearance. There is  moderate calcification of the mitral valve leaflet(s). Moderate mitral  annular calcification. Trivial mitral valve regurgitation. No evidence of  mitral valve stenosis.   Tricuspid Valve: The tricuspid valve is normal in structure. Tricuspid  valve regurgitation is mild . No evidence of tricuspid stenosis.   Aortic Valve: The aortic valve is tricuspid. There is severe calcifcation  of the aortic valve. Aortic valve regurgitation is not visualized.  Moderate to severe aortic stenosis is present. Aortic valve mean gradient  measures 36.5 mmHg. Aortic valve peak  gradient measures 63.3 mmHg. Aortic valve area, by VTI measures 1.01 cm.   Pulmonic Valve: The pulmonic valve was normal in structure. Pulmonic valve  regurgitation is trivial. No  evidence of pulmonic stenosis.   Aorta: The aortic root is normal in size and structure.   Venous: The inferior vena cava is normal in size with greater than 50%  respiratory variability, suggesting right atrial pressure of 3 mmHg.   IAS/Shunts: No atrial level shunt detected by color flow Doppler.   Additional Comments: 3D was performed not requiring image post processing  on an independent workstation and was normal.      LEFT VENTRICLE  PLAX 2D  LVIDd:         4.50 cm         Diastology  LVIDs:         2.60 cm         LV e' medial:    5.11 cm/s  LV PW:         1.05 cm         LV E/e' medial:  25.5  LV IVS:        1.20 cm         LV e' lateral:   5.88 cm/s  LVOT diam:     2.50 cm         LV E/e' lateral: 22.2  LV SV:         98  LV SV Index:   51  LVOT Area:     4.91 cm        3D Volume EF  LV IVRT:       77 msec         LV 3D EF:    Left                                              ventricul                                              ar                                              ejection                                              fraction                                              by 3D                                              volume is                                              65 %.  3D Volume EF:                                 3D EF:        65 %                                 LV EDV:       161 ml                                 LV ESV:       56 ml                                 LV SV:        105 ml   RIGHT VENTRICLE             IVC  RV Basal diam:  3.40 cm     IVC diam: 1.40 cm  RV S prime:     12.30 cm/s  TAPSE (M-mode): 1.9 cm      PULMONARY VEINS  RVSP:           38.3 mmHg   A Reversal Velocity: 20.70 cm/s                              Diastolic Velocity:  41.30 cm/s                              S/D Velocity:        1.30                              Systolic Velocity:   53.20 cm/s   LEFT ATRIUM              Index        RIGHT ATRIUM           Index  LA diam:        4.70 cm  2.43 cm/m   RA Pressure: 3.00 mmHg  LA Vol (A2C):   123.0 ml 63.66 ml/m  RA Area:     17.20 cm  LA Vol (A4C):   72.9 ml  37.73 ml/m  RA Volume:   47.40 ml  24.53 ml/m  LA Biplane Vol: 102.0 ml 52.79 ml/m   AORTIC VALVE  AV Area (Vmax):    1.05 cm  AV Area (Vmean):   1.03 cm  AV Area (VTI):     1.01 cm  AV Vmax:           397.75 cm/s  AV Vmean:           282.000 cm/s  AV VTI:            0.963 m  AV Peak Grad:      63.3 mmHg  AV Mean Grad:      36.5 mmHg  LVOT Vmax:         85.23 cm/s  LVOT Vmean:        59.033 cm/s  LVOT VTI:          0.199  m  LVOT/AV VTI ratio: 0.21    AORTA  Ao Root diam: 2.90 cm  Ao Asc diam:  2.70 cm   MITRAL VALVE                TRICUSPID VALVE  MV Area (PHT)  cm          TR Peak grad:   35.3 mmHg  MV Decel Time: 220 msec     TR Vmax:        297.00 cm/s  MV E velocity: 130.50 cm/s  Estimated RAP:  3.00 mmHg  MV A velocity: 132.00 cm/s  RVSP:           38.3 mmHg  MV E/A ratio:  0.99                              SHUNTS                              Systemic VTI:  0.20 m                              Systemic Diam: 2.50 cm   Cardiac cath 01/07/24: 1.  Stable coronary artery disease with patent left main, patent LAD with mild plaquing, patent circumflex with moderate proximal stenosis of 50 to 60% with patent stents, and patent RCA with mild to moderate nonobstructive plaquing 2.  Severe aortic stenosis with peak gradient 43 mmHg, mean gradient 37 mmHg, calculated aortic valve area 0.78 cm 3.  Right heart catheterization data: RA mean 8 mmHg RV 33/11 mmHg PA 32/15 mean 23 mmHg Pulmonary wedge pressure A-wave 17, V wave 18, mean 19 mmHg   LV pressure 206/18 Aortic pressure 164/72   Cardiac output/index 5.78/2.95   Recommendation: Structural Heart/Surgical consultations for TAVR. May need renal clearance for CTA studies.    Procedural Details  Technical Details INDICATION: 89 year old male with chronic kidney disease and known coronary artery disease with prior stenting.  The patient has developed severe, symptomatic aortic stenosis and is referred for right and left heart catheterization for further evaluation.  PROCEDURAL DETAILS: There was an indwelling IV in a right antecubital vein. Using normal sterile technique, the IV was changed out for a 5 Fr brachial sheath over a 0.018 inch wire. The right  wrist was then prepped, draped, and anesthetized with 1% lidocaine . Using the modified Seldinger technique a 5/6 French Slender sheath was placed in the right radial artery. Intra-arterial verapamil  was administered through the radial artery sheath. IV heparin  was administered after a JR4 catheter was advanced into the central aorta. A Swan-Ganz catheter was used for the right heart catheterization. Standard protocol was followed for recording of right heart pressures and sampling of oxygen  saturations. Fick cardiac output was calculated. Standard Judkins catheters were used for selective coronary angiography. LV pressure is recorded and an aortic valve pullback is performed.  An AL-1 catheter was used to direct a J-wire across the aortic valve.  There were no immediate procedural complications. The patient was transferred to the post catheterization recovery area for further monitoring.      Estimated blood loss <50 mL.   During this procedure medications were administered to achieve and maintain moderate conscious sedation while the patient's heart rate, blood pressure, and oxygen  saturation were continuously monitored and I was present face-to-face 100% of  this time. Charlene Haire Rad Tech and [employer] Cardiovascular Specialist are independent, trained observers who assisted in the monitoring of the patient's level of consciousness.   Medications (Filter: Administrations occurring from 1406 to 1526 on 01/07/24)  important  Continuous medications are totaled by the amount administered until 01/07/24 1526.   midazolam  (VERSED ) injection (mg)  Total dose: 2 mg Date/Time Rate/Dose/Volume Action   01/07/24 1431 1 mg Given   1441 1 mg Given   fentaNYL  (SUBLIMAZE ) injection (mcg)  Total dose: 50 mcg Date/Time Rate/Dose/Volume Action   01/07/24 1431 25 mcg Given   1441 25 mcg Given   lidocaine  (PF) (XYLOCAINE ) 1 % injection (mL)  Total volume: 2 mL Date/Time Rate/Dose/Volume Action    01/07/24 1441 2 mL Given   Heparin  (Porcine) in NaCl 1000-0.9 UT/500ML-% SOLN (mL)  Total volume: 1,000 mL Date/Time Rate/Dose/Volume Action   01/07/24 1443 1,000 mL Given   Radial Cocktail/Verapamil  only (mL)  Total volume: 10 mL Date/Time Rate/Dose/Volume Action   01/07/24 1444 10 mL Given   iohexol  (OMNIPAQUE ) 350 MG/ML injection (mL)  Total volume: 20 mL Date/Time Rate/Dose/Volume Action   01/07/24 1509 20 mL Given   heparin  sodium (porcine) injection (Units)  Total dose: 5,000 Units Date/Time Rate/Dose/Volume Action   01/07/24 1457 5,000 Units Given   labetalol  (NORMODYNE ) injection (mg)  Total dose: 10 mg Date/Time Rate/Dose/Volume Action   01/07/24 1520 10 mg Given    Sedation Time  Sedation Time Physician-1: 34 minutes 37 seconds Contrast     Administrations occurring from 1406 to 1526 on 01/07/24:  Medication Name Total Dose  iohexol  (OMNIPAQUE ) 350 MG/ML injection 20 mL   Radiation/Fluoro  Fluoro time: 6.6 (min) DAP: 18364 (mGycm2) Cumulative Air Kerma: 251 (mGy) Complications  Complications documented before study signed (01/07/2024  3:29 PM)   No complications were associated with this study.  Documented by Karis Remak - 01/07/2024  3:29 PM     Coronary Findings  Diagnostic Dominance: Right Left Main  There is mild diffuse disease throughout the vessel. Left main is patent with mild nonobstructive plaquing.    Left Anterior Descending  The LAD is patent to the apex. There is no severe stenosis throughout the vessel. There is mild plaquing in the proximal vessel without any significant obstruction.  Prox LAD to Mid LAD lesion is 40% stenosed.    First Diagonal Branch  Vessel is small in size.  1st Diag lesion is 85% stenosed.    Left Circumflex  The circumflex is patent with moderate in-stent restenosis in the proximal aspect of the circumflex. There is no tight stenosis. The stent in the obtuse marginal is patent with no stenosis.  Ost Cx  lesion is 65% stenosed.  Ost Cx to Prox Cx lesion is 50% stenosed. The lesion was previously treated .    First Obtuse Marginal Branch  1st Mrg-1 lesion is 70% stenosed.  1st Mrg-2 lesion is 30% stenosed. The lesion was previously treated .    Second Obtuse Marginal Branch  Vessel is small in size.    Third Obtuse Marginal Branch  Vessel is small in size.    Right Coronary Artery  Patient with mild to moderate diffuse plaquing but no high-grade stenoses. The PDA is diffusely diseased.  Prox RCA to Mid RCA lesion is 40% stenosed.  Dist RCA lesion is 40% stenosed.    Right Posterior Descending Artery  RPDA lesion is 60% stenosed.    Intervention   No interventions have been documented.  Left Heart  Left Ventricle LV end diastolic pressure is normal.  Aortic Valve There is severe aortic valve stenosis. The aortic valve is calcified. There is restricted aortic valve motion.   Coronary Diagrams  Diagnostic Dominance: Right  Intervention   Implants   No implant documentation for this case.   Syngo Images   Show images for CARDIAC CATHETERIZATION Images on Long Term Storage   Show images for Artyom, Stencel to Procedure Log  Procedure Log    Hemo Data  Flowsheet Row Most Recent Value  Fick Cardiac Output 5.78 L/min  Fick Cardiac Output Index 2.95 (L/min)/BSA  Aortic Mean Gradient 37.34 mmHg  Aortic Peak Gradient 42.7 mmHg  Aortic Valve Area 0.78  Aortic Value Area Index 0.4 cm2/BSA  RA A Wave 14 mmHg  RA V Wave 10 mmHg  RA Mean 8 mmHg  RV Systolic Pressure 33 mmHg  RV Diastolic Pressure 5 mmHg  RV EDP 11 mmHg  PA Systolic Pressure 32 mmHg  PA Diastolic Pressure 15 mmHg  PA Mean 23 mmHg  PW A Wave 17 mmHg  PW V Wave 18 mmHg  PW Mean 19 mmHg  AO Systolic Pressure 150 mmHg  AO Diastolic Pressure 74 mmHg  AO Mean 104 mmHg  LV Systolic Pressure 206 mmHg  LV Diastolic Pressure -18 mmHg  LV EDP 20 mmHg  Arterial Occlusion Pressure Extended  Systolic Pressure 164 mmHg  Arterial Occlusion Pressure Extended Diastolic Pressure 72 mmHg  Arterial Occlusion Pressure Extended Mean Pressure 110 mmHg  Left Ventricular Apex Extended Systolic Pressure 206 mmHg  LVp Diastolic Pressure -14 mmHg  Left Ventricular Apex Extended EDP Pressure 18 mmHg  QP/QS 1  TPVR Index 7.79 HRUI  TSVR Index 34.21 HRUI  PVR SVR Ratio 0.04  TPVR/TSVR Ratio 0.23   Recent Labs: 01/02/2024: ALT 24; BUN 45; Creatinine, Ser 2.64; Platelets 164 01/07/2024: Hemoglobin 12.6; Hemoglobin 12.6; Potassium 3.9; Potassium 3.9; Sodium 142; Sodium 141    Wt Readings from Last 3 Encounters:  01/14/24 189 lb 6.4 oz (85.9 kg)  01/07/24 180 lb (81.6 kg)  12/28/23 189 lb 3.2 oz (85.8 kg)    Assessment and Plan:   1. Severe Aortic Valve Stenosis: He has severe, stage D aortic valve stenosis. NYHA class 2 symptoms. I have personally reviewed the echo images. The aortic valve is thickened and calcified with limited leaflet mobility. I think he would benefit from AVR. Given advanced age, he is not a good candidate for conventional AVR by surgical approach. I think he may be a good candidate for TAVR.   I have reviewed the natural history of aortic stenosis with the patient and their family members  who are present today. We have discussed the limitations of medical therapy and the poor prognosis associated with symptomatic aortic stenosis. We have reviewed potential treatment options, including palliative medical therapy, conventional surgical aortic valve replacement, and transcatheter aortic valve replacement. We discussed treatment options in the context of the patient's specific comorbid medical conditions.   He would like to continue with planning for TAVR.  Risks and benefits of the valve procedure are reviewed with the patient. He will have a cardiac CT, CTA of the chest/abdomen and pelvis and he will then be referred to see one of the CT surgeons on our TAVR team. He will need  clearance from his Nephrologist (Dr. Norine) prior to his CT scans.     Labs/ tests ordered today include:  No orders of the defined types  were placed in this encounter.  Disposition:   F/U will be arranged with the structural team  Signed, Lonni Cash, MD, [facility] 01/14/2024 1:42 PM    [facility] [address] [place], [place], [place]  [address] Phone: [phone]; Fax: [phone]

## 2024-01-14 NOTE — Patient Instructions (Signed)
 Medication Instructions:  No medication changes were made at this visit. Continue current regimen.   *If you need a refill on your cardiac medications before your next appointment, please call your pharmacy*  Lab Work: None ordered today. If you have labs (blood work) drawn today and your tests are completely normal, you will receive your results only by: MyChart Message (if you have MyChart) OR A paper copy in the mail If you have any lab test that is abnormal or we need to change your treatment, we will call you to review the results.  Testing/Procedures: None ordered today.  Follow-Up: At [facility], you and your health needs are our priority.  As part of our continuing mission to provide you with exceptional heart care, our providers are all part of one team.  This team includes your primary Cardiologist (physician) and Advanced Practice Providers or APPs (Physician Assistants and Nurse Practitioners) who all work together to provide you with the care you need, when you need it.  Other Instructions Patient has to make an appointment with nephrologist before we can schedule CT scans.

## 2024-01-14 NOTE — Telephone Encounter (Signed)
 Marco Acevedo

## 2024-01-22 ENCOUNTER — Other Ambulatory Visit (HOSPITAL_COMMUNITY): Payer: Self-pay | Admitting: Emergency Medicine

## 2024-01-23 ENCOUNTER — Other Ambulatory Visit: Payer: Self-pay

## 2024-01-23 DIAGNOSIS — I350 Nonrheumatic aortic (valve) stenosis: Secondary | ICD-10-CM

## 2024-01-24 ENCOUNTER — Telehealth: Payer: Self-pay

## 2024-01-24 NOTE — Telephone Encounter (Signed)
 Marco Acevedo

## 2024-01-25 ENCOUNTER — Other Ambulatory Visit: Payer: Self-pay

## 2024-01-25 DIAGNOSIS — N184 Chronic kidney disease, stage 4 (severe): Secondary | ICD-10-CM

## 2024-01-25 DIAGNOSIS — I350 Nonrheumatic aortic (valve) stenosis: Secondary | ICD-10-CM

## 2024-01-30 ENCOUNTER — Ambulatory Visit (HOSPITAL_COMMUNITY)
Admission: RE | Admit: 2024-01-30 | Discharge: 2024-01-30 | Disposition: A | Discharge status: Home / Self Care | Source: Ambulatory Visit | Attending: Cardiology | Admitting: Cardiology

## 2024-01-30 DIAGNOSIS — I2510 Atherosclerotic heart disease of native coronary artery without angina pectoris: Secondary | ICD-10-CM | POA: Diagnosis not present

## 2024-01-30 DIAGNOSIS — N184 Chronic kidney disease, stage 4 (severe): Secondary | ICD-10-CM | POA: Insufficient documentation

## 2024-01-30 DIAGNOSIS — I350 Nonrheumatic aortic (valve) stenosis: Secondary | ICD-10-CM | POA: Diagnosis not present

## 2024-01-30 MED ORDER — SODIUM CHLORIDE 0.9 % WEIGHT BASED INFUSION
3.0000 mL/kg/h | INTRAVENOUS | Status: AC
  Administered 2024-01-30: 3 mL/kg/h via INTRAVENOUS

## 2024-01-30 MED ORDER — SODIUM CHLORIDE 0.9 % WEIGHT BASED INFUSION: 1.0000 mL/kg/h | INTRAVENOUS | Status: DC

## 2024-01-30 MED ORDER — IOHEXOL 350 MG/ML SOLN
100.0000 mL | Freq: Once | INTRAVENOUS | Status: AC | PRN
Start: 2024-01-30 — End: 2024-01-30
  Administered 2024-01-30: 100 mL via INTRAVENOUS

## 2024-01-30 NOTE — Progress Notes (Signed)
 Procedure Type: Isolated AVR Perioperative Outcome Estimate % Operative Mortality 7.87% Morbidity & Mortality 19.3% Stroke 3.02% Renal Failure 15.2% Reoperation 5.38% Prolonged Ventilation 8.73% Deep Sternal Wound Infection 0.072% Long Hospital Stay (>14 days) 9.5% Short Hospital Stay (<6 days)* 20.3%

## 2024-01-31 ENCOUNTER — Ambulatory Visit: Payer: Self-pay | Admitting: Cardiovascular Disease

## 2024-02-02 ENCOUNTER — Telehealth: Payer: Self-pay | Admitting: Physician Assistant

## 2024-02-02 MED ORDER — DOXYCYCLINE HYCLATE 100 MG PO CAPS: 100.0000 mg | ORAL_CAPSULE | Freq: Two times a day (BID) | ORAL | 0 refills | Status: DC

## 2024-02-02 NOTE — Telephone Encounter (Signed)
   The patient called the answering service after-hours today.  Recently had issues with cough. Structural scans incidentally picked up Patchy airspace infiltrates in the right lower lobe, in a different location when compared to Aug 10, 2022. Peribronchial thickening is also present. Differential considerations include infectious and inflammatory etiologies. Correlate clinically for signs of pneumonia. Izetta Hummer PA-C spoke with patient yesterday and he was originally feeling better. However, today coughing has gotten worse. No fever, chills, disorientation or other high risk red flags. Chronic DOE is stable. D/w Izetta - will rx doxycycline  100mg  BID x 5 days; patient advised to seek care over the weekend in person in ED/UC if symptoms worsen/fail to improve. Otherwise if starting to feel better, advised to continue rx and f/u PCP next week. The patient verbalized understanding and gratitude.  Will cc Katie and Dr. Verlin.  Abagayle Klutts N Indianna Boran, PA-C

## 2024-02-06 ENCOUNTER — Other Ambulatory Visit (HOSPITAL_COMMUNITY): Payer: Medicare (Managed Care)

## 2024-02-14 NOTE — H&P (View-Only) (Signed)
 [address] [place] [address]             [id number]      [facility] Medical Record #[id number] Date of Birth: [birth year]  Referring: Jeffrie Oneil BROCKS, MD Primary Care: Okey Carlin Redbird, MD Primary Cardiologist:Mark Jeffrie, MD  Chief Complaint:    Chief Complaint  Patient presents with   Aortic Stenosis    TAVR eval, review all studies    History of Present Illness:     Marco Acevedo is a 89 y.o. male presents for surgical evaluation of severe aortic stenosis.  He occasionally has some chest pressure, and reflux that are relieved with nitroglycerin .  He remains active, and plays golf.        Past Medical History:  Diagnosis Date   Anemia    low iron    Aortic stenosis    Arthritis    BPH (benign prostatic hyperplasia)    CAD (coronary artery disease)    Chronic diastolic CHF (congestive heart failure) (HCC)    CKD (chronic kidney disease), stage IV (HCC)    Claustrophobia    Complication of anesthesia    has to have head elevated to keep from strangling on post nasal drip   COPD (chronic obstructive pulmonary disease) (HCC)    Diabetes mellitus type II    type 2   GERD (gastroesophageal reflux disease)    Gout    Granuloma annulare    History of hiatal hernia    History of kidney stones    Litthotrispy   Hyperlipidemia    Hypertension    Neuropathy    Obesity    OSA (obstructive sleep apnea)    Stroke (HCC) 10/2017   Weakness right arm and leg    Past Surgical History:  Procedure Laterality Date   BLEPHAROPLASTY     CARDIAC CATHETERIZATION  [id number]   X 2 stents   CHOLECYSTECTOMY N/A 09/23/2014   Procedure: LAPAROSCOPIC CHOLECYSTECTOMY WITH INTRAOPERATIVE CHOLANGIOGRAM;  Surgeon: Deward Null [name], MD;  Location: [facility] OR;  Service: General;  Laterality: N/A;   COLONOSCOPY     COLONOSCOPY WITH PROPOFOL  N/A 03/22/2016   Procedure: COLONOSCOPY WITH PROPOFOL ;  Surgeon: Jerrell Sol, MD;  Location: [facility]  ENDOSCOPY;  Service: Endoscopy;  Laterality: N/A;   ENDARTERECTOMY Left 10/19/2017   Procedure: ENDARTERECTOMY CAROTID LEFT;  Surgeon: Oris Krystal FALCON, MD;  Location: [facility] OR;  Service: Vascular;  Laterality: Left;   ESOPHAGOGASTRODUODENOSCOPY (EGD) WITH PROPOFOL  N/A 03/22/2016   Procedure: ESOPHAGOGASTRODUODENOSCOPY (EGD) WITH PROPOFOL ;  Surgeon: Jerrell Sol, MD;  Location: [facility] ENDOSCOPY;  Service: Endoscopy;  Laterality: N/A;   EYE SURGERY     both cataracts   INTRAMEDULLARY (IM) NAIL INTERTROCHANTERIC Left 08/11/2022   Procedure: INTRAMEDULLARY (IM) NAIL INTERTROCHANTERIC;  Surgeon: Edna Toribio LABOR, MD;  Location: [facility] OR;  Service: Orthopedics;  Laterality: Left;   KNEE ARTHROSCOPY WITH MEDIAL MENISECTOMY Right 08/19/2013   Procedure: RIGHT KNEE ARTHROSCOPY WITH PARTIAL MEDIAL MENISECTOMY AND CHONDROPLASTY;  Surgeon: Maude KANDICE Herald, MD;  Location: [facility];  Service: Orthopedics;  Laterality: Right;   LEFT HEART CATH AND CORONARY ANGIOGRAPHY N/A 06/28/2016   Procedure: Left Heart Cath and Coronary Angiography;  Surgeon: Victory LELON Sharps, MD;  Location: [facility] INVASIVE CV LAB;  Service: Cardiovascular;  Laterality: N/A;   LEFT HEART CATH AND CORONARY ANGIOGRAPHY N/A 02/25/2019   Procedure: LEFT HEART CATH AND CORONARY ANGIOGRAPHY;  Surgeon: Sharps Victory LELON, MD;  Location: [facility] INVASIVE CV LAB;  Service: Cardiovascular;  Laterality: N/A;   LEFT HEART CATHETERIZATION WITH CORONARY ANGIOGRAM N/A 10/03/2011   Procedure: LEFT HEART CATHETERIZATION WITH CORONARY ANGIOGRAM;  Surgeon: Victory LELON Claudene DOUGLAS, MD;  Location: [facility] CATH LAB;  Service: Cardiovascular;  Laterality: N/A;   LEFT HEART CATHETERIZATION WITH CORONARY ANGIOGRAM N/A 09/12/2012   Procedure: LEFT HEART CATHETERIZATION WITH CORONARY ANGIOGRAM;  Surgeon: Victory LELON Claudene DOUGLAS, MD;  Location: [facility] CATH LAB;  Service: Cardiovascular;  Laterality: N/A;   LIPOMA EXCISION     Biospy only; left parotid gland   LITHOTRIPSY     none      PERCUTANEOUS CORONARY STENT INTERVENTION (PCI-S) N/A 09/17/2012   Procedure: PERCUTANEOUS CORONARY STENT INTERVENTION (PCI-S);  Surgeon: Victory LELON Claudene DOUGLAS, MD;  Location: [facility] CATH LAB;  Service: Cardiovascular;  Laterality: N/A;   RIGHT/LEFT HEART CATH AND CORONARY ANGIOGRAPHY N/A 01/07/2024   Procedure: RIGHT/LEFT HEART CATH AND CORONARY ANGIOGRAPHY;  Surgeon: Wonda Sharper, MD;  Location: [facility] INVASIVE CV LAB;  Service: Cardiovascular;  Laterality: N/A;   TRIGGER FINGER RELEASE Left 12/21/2015   Procedure: LEFT LONG FINGER TRIGGER RELEASE ;  Surgeon: Alm Hummer, MD;  Location: [facility];  Service: Orthopedics;  Laterality: Left;    Social History:  Social History   Tobacco Use  Smoking Status Former   Current packs/day: 0.00   Average packs/day: 1 pack/day for 64.0 years (64.0 ttl pk-yrs)   Types: Cigarettes   Start date: 05/29/1947   Quit date: 05/29/2011   Years since quitting: 12.7  Smokeless Tobacco Never    Social History   Substance and Sexual Activity  Alcohol Use Yes   Comment: rarely     Allergies  Allergen Reactions   Cimetidine Other (See Comments)    Anxiety Attacks   Metformin  Hcl     Other reaction(s): decreased kidney function      Current Outpatient Medications  Medication Sig Dispense Refill   albuterol  (VENTOLIN  HFA) 108 (90 Base) MCG/ACT inhaler Inhale 2 puffs into the lungs every 6 (six) hours as needed for wheezing or shortness of breath.      allopurinol  (ZYLOPRIM ) 100 MG tablet Take 100 mg by mouth daily.     amLODipine  (NORVASC ) 2.5 MG tablet Take 2.5 mg by mouth daily.     aspirin  EC 81 MG tablet Take 1 tablet (81 mg total) by mouth daily. 90 tablet 3   atorvastatin  (LIPITOR ) 80 MG tablet Take 1 tablet (80 mg total) by mouth daily at 6 PM. 30 tablet 0   capsaicin (ZOSTRIX) 0.025 % cream Apply 1 Application topically 2 (two) times daily as needed (pain).     carvedilol  (COREG ) 6.25 MG tablet Take 6.25 mg by mouth 2 (two) times  daily with a meal.     cholecalciferol (VITAMIN D3) 25 MCG (1000 UT) tablet Take 1,000 Units by mouth daily.     clopidogrel  (PLAVIX ) 75 MG tablet Take 1 tablet (75 mg total) by mouth daily. 30 tablet 0   dapagliflozin  propanediol (FARXIGA ) 10 MG TABS tablet Take 10 mg by mouth daily.     Docusate Sodium  (DSS) 100 MG CAPS as needed (constipation).     doxycycline  (VIBRAMYCIN ) 100 MG capsule Take 1 capsule (100 mg total) by mouth 2 (two) times daily. 10 capsule 0   Ferrous Sulfate  (IRON ) 325 (65 FE) MG TABS Take 325 mg by mouth daily.      finasteride  (PROSCAR ) 5 MG tablet Take 1 tablet (5 mg total) by  mouth daily. 30 tablet 5   gabapentin  (NEURONTIN ) 300 MG capsule Take 2 capsules (600 mg total) by mouth at bedtime. 60 capsule 0   guaifenesin  (HUMIBID E) 400 MG TABS tablet Take 400 mg by mouth 2 (two) times daily.     isosorbide  mononitrate (IMDUR ) 120 MG 24 hr tablet Take 1 tablet (120 mg total) by mouth daily. 90 tablet 3   nitroGLYCERIN  (NITROSTAT ) 0.4 MG SL tablet Place 0.4 mg under the tongue every 5 (five) minutes as needed for chest pain.     pantoprazole  (PROTONIX ) 40 MG tablet Take 40 mg by mouth daily.     primidone  (MYSOLINE ) 50 MG tablet Take 50 mg by mouth at bedtime.      sucralfate  (CARAFATE ) 1 GM/10ML suspension Take 1 g by mouth 2 (two) times daily as needed (heartburn).     traMADol  (ULTRAM ) 50 MG tablet Take 1 tablet (50 mg total) by mouth at bedtime as needed for moderate pain. (Patient taking differently: Take 100 mg by mouth at bedtime.)     vitamin B-12 (CYANOCOBALAMIN ) 1000 MCG tablet Take 1,000 mcg by mouth daily.     No current facility-administered medications for this visit.    (Not in a hospital admission)   Family History  Problem Relation Age of Onset   Aortic aneurysm Father    Aneurysm Mother        brain   Cancer Brother    COPD Sister    Other Sister        health hx unknown   Other Sister        health hx unknown   Other Brother        health hx  unknown     Review of Systems:   Review of Systems  Constitutional:  Positive for malaise/fatigue.  Respiratory:  Positive for shortness of breath.   Cardiovascular:  Positive for chest pain.  Neurological:  Negative for dizziness.      Physical Exam: BP (!) 141/78 (BP Location: Left Arm, Patient Position: Sitting, Cuff Size: Normal)   Pulse (!) 57   Resp 20   Wt 188 lb (85.3 kg)   SpO2 96% Comment: RA  BMI 28.59 kg/m  Physical Exam Constitutional:      General: He is not in acute distress.    Appearance: He is not ill-appearing.  HENT:     Head: Normocephalic and atraumatic.  Eyes:     Extraocular Movements: Extraocular movements intact.  Cardiovascular:     Rate and Rhythm: Bradycardia present.  Pulmonary:     Effort: Pulmonary effort is normal. No respiratory distress.  Abdominal:     General: Abdomen is flat. There is no distension.  Musculoskeletal:        General: Normal range of motion.     Cervical back: Normal range of motion.  Skin:    General: Skin is warm and dry.  Neurological:     General: No focal deficit present.     Mental Status: He is alert and oriented to person, place, and time.       Cardiac Studies & Procedures   ______________________________________________________________________________________________ CARDIAC CATHETERIZATION  CARDIAC CATHETERIZATION 06/28/2016  Conclusion  No major change since the post PCI angiogram in 2014.  There is calcification with globular proximal LAD 40% stenosis.  The circumflex ostium contains eccentric 40% narrowing followed by proximal circumflex stent which contains up to 50% ISR. The non-stented mid segment contains diffuse 50-60% narrowing. The distal first obtuse marginal stent  contains 50-60% proximal edge in-stent restenosis.  The right coronary contains diffuse disease up to 50% throughout the proximal mid and distal segment.  Left ventriculography was not performed because of kidney  impairment. LVEDP was markedly elevated at 27 mmHg.  RECOMMENDATIONS:   Aggressive blood pressure control. Add amlodipine  5 mg daily. May need low-dose diuretic therapy  Sublingual nitroglycerin  as needed to control angina. Continue Ranexa  as previously prescribed.  Clinical follow-up in 7-10 days.  Follow kidney function closely after contrast exposure, 95 cc used.  Findings Coronary Findings Diagnostic  Dominance: Co-dominant  Left Anterior Descending The lesion is eccentric. The lesion is calcified.  First Diagonal Branch Vessel is moderate in size.  Second Diagonal Branch Vessel is small in size.  Third Diagonal Branch Vessel is small in size.  Ramus Intermedius Vessel is small.  Left Circumflex  The lesion was previously treated.  First Obtuse Marginal Branch  The lesion was previously treated.  Second Obtuse Marginal Branch Vessel is small in size.  Third Obtuse Marginal Branch Vessel is small in size.  Right Coronary Artery  Right Posterior Descending Artery Vessel is small in size.  Right Posterior Atrioventricular Artery Vessel is small in size.  Intervention  No interventions have been documented.   CARDIAC CATHETERIZATION  CARDIAC CATHETERIZATION 01/07/2024  Conclusion 1.  Stable coronary artery disease with patent left main, patent LAD with mild plaquing, patent circumflex with moderate proximal stenosis of 50 to 60% with patent stents, and patent RCA with mild to moderate nonobstructive plaquing 2.  Severe aortic stenosis with peak gradient 43 mmHg, mean gradient 37 mmHg, calculated aortic valve area 0.78 cm 3.  Right heart catheterization data: RA mean 8 mmHg RV 33/11 mmHg PA 32/15 mean 23 mmHg Pulmonary wedge pressure A-wave 17, V wave 18, mean 19 mmHg  LV pressure 206/18 Aortic pressure 164/72  Cardiac output/index 5.78/2.95  Recommendation: Structural Heart/Surgical consultations for TAVR. May need renal clearance for CTA  studies.  Findings Coronary Findings Diagnostic  Dominance: Right  Left Main There is mild diffuse disease throughout the vessel. Left main is patent with mild nonobstructive plaquing.  Left Anterior Descending The LAD is patent to the apex.  There is no severe stenosis throughout the vessel.  There is mild plaquing in the proximal vessel without any significant obstruction. Prox LAD to Mid LAD lesion is 40% stenosed.  First Diagonal Branch Vessel is small in size. 1st Diag lesion is 85% stenosed.  Left Circumflex The circumflex is patent with moderate in-stent restenosis in the proximal aspect of the circumflex.  There is no tight stenosis.  The stent in the obtuse marginal is patent with no stenosis. Ost Cx lesion is 65% stenosed. Ost Cx to Prox Cx lesion is 50% stenosed. The lesion was previously treated .  First Obtuse Marginal Branch 1st Mrg-1 lesion is 70% stenosed. 1st Mrg-2 lesion is 30% stenosed. The lesion was previously treated .  Second Obtuse Marginal Branch Vessel is small in size.  Third Obtuse Marginal Branch Vessel is small in size.  Right Coronary Artery Patient with mild to moderate diffuse plaquing but no high-grade stenoses.  The PDA is diffusely diseased. Prox RCA to Mid RCA lesion is 40% stenosed. Dist RCA lesion is 40% stenosed.  Right Posterior Descending Artery RPDA lesion is 60% stenosed.  Intervention  No interventions have been documented.   STRESS TESTS  MYOCARDIAL PERFUSION IMAGING 11/25/2014  Interpretation Summary  Nuclear stress EF: 73%.  There was no ST segment deviation noted during  stress.  This is an intermediate risk study.  The left ventricular ejection fraction is hyperdynamic (>65%).  There was a medium-sized, mild primarily reversible basal to mid inferolateral perfusion defect and a small reversible apical septal perfusion defect with normal wall motion.  It is possible that this represents primarily ischemia, but  the stress study is overall count poor compared to the rest.  Overall, probably intermediate risk due to the extent of the defects.   ECHOCARDIOGRAM  ECHOCARDIOGRAM COMPLETE 12/28/2023  Narrative ECHOCARDIOGRAM REPORT    Patient Name:   GERSON FAUTH Date of Exam: 12/28/2023 Medical Rec #:  [id number]      Height:       65.0 in Accession #:    [id number]     Weight:       189.2 lb Date of Birth:  06/18/1934       BSA:          1.932 m Patient Age:    89 years       BP:           134/68 mmHg Patient Gender: M              HR:           75 bpm. Exam Location:  [address]  Procedure: 2D Echo, 3D Echo, Cardiac Doppler and Color Doppler (Both Spectral and Color Flow Doppler were utilized during procedure).  Indications:    I50.9 CHF  History:        Patient has prior history of Echocardiogram examinations. CHF, CAD, Stroke and COPD, Aortic Valve Disease, Signs/Symptoms:Chest Pain; Risk Factors:Hypertension, Family History of Coronary Artery Disease, Diabetes, Dyslipidemia, Sleep Apnea and Former Smoker. Obesity, Chronic Kindney Disease, Aortic Stenosis.  Sonographer:    Heather Hawks RDCS Referring Phys: ONEIL JAYSON PARCHMENT  IMPRESSIONS   1. Left ventricular ejection fraction, by estimation, is 65 to 70%. Left ventricular ejection fraction by 3D volume is 65 %. The left ventricle has normal function. The left ventricle has no regional wall motion abnormalities. There is mild concentric left ventricular hypertrophy. Left ventricular diastolic parameters are consistent with Grade I diastolic dysfunction (impaired relaxation). 2. Right ventricular systolic function is normal. The right ventricular size is normal. 3. Left atrial size was moderately dilated. 4. The mitral valve is degenerative. Trivial mitral valve regurgitation. No evidence of mitral stenosis. Moderate mitral annular calcification. 5. The aortic valve is tricuspid. There is severe calcifcation of the aortic valve. Aortic  valve regurgitation is not visualized. Moderate to severe aortic valve stenosis. Aortic valve area, by VTI measures 1.01 cm. Aortic valve mean gradient measures 36.5 mmHg. Aortic valve Vmax measures 3.98 m/s. 6. The inferior vena cava is normal in size with greater than 50% respiratory variability, suggesting right atrial pressure of 3 mmHg.  FINDINGS Left Ventricle: Left ventricular ejection fraction, by estimation, is 65 to 70%. Left ventricular ejection fraction by 3D volume is 65 %. The left ventricle has normal function. The left ventricle has no regional wall motion abnormalities. The left ventricular internal cavity size was normal in size. There is mild concentric left ventricular hypertrophy. Left ventricular diastolic parameters are consistent with Grade I diastolic dysfunction (impaired relaxation).  Right Ventricle: The right ventricular size is normal. No increase in right ventricular wall thickness. Right ventricular systolic function is normal.  Left Atrium: Left atrial size was moderately dilated.  Right Atrium: Right atrial size was normal in size.  Pericardium: There is no evidence of pericardial effusion.  Mitral Valve: The mitral valve is degenerative in appearance. There is moderate calcification of the mitral valve leaflet(s). Moderate mitral annular calcification. Trivial mitral valve regurgitation. No evidence of mitral valve stenosis.  Tricuspid Valve: The tricuspid valve is normal in structure. Tricuspid valve regurgitation is mild . No evidence of tricuspid stenosis.  Aortic Valve: The aortic valve is tricuspid. There is severe calcifcation of the aortic valve. Aortic valve regurgitation is not visualized. Moderate to severe aortic stenosis is present. Aortic valve mean gradient measures 36.5 mmHg. Aortic valve peak gradient measures 63.3 mmHg. Aortic valve area, by VTI measures 1.01 cm.  Pulmonic Valve: The pulmonic valve was normal in structure. Pulmonic valve  regurgitation is trivial. No evidence of pulmonic stenosis.  Aorta: The aortic root is normal in size and structure.  Venous: The inferior vena cava is normal in size with greater than 50% respiratory variability, suggesting right atrial pressure of 3 mmHg.  IAS/Shunts: No atrial level shunt detected by color flow Doppler.  Additional Comments: 3D was performed not requiring image post processing on an independent workstation and was normal.   LEFT VENTRICLE PLAX 2D LVIDd:         4.50 cm         Diastology LVIDs:         2.60 cm         LV e' medial:    5.11 cm/s LV PW:         1.05 cm         LV E/e' medial:  25.5 LV IVS:        1.20 cm         LV e' lateral:   5.88 cm/s LVOT diam:     2.50 cm         LV E/e' lateral: 22.2 LV SV:         98 LV SV Index:   51 LVOT Area:     4.91 cm        3D Volume EF LV IVRT:       77 msec         LV 3D EF:    Left ventricul ar ejection fraction by 3D volume is 65 %.  3D Volume EF: 3D EF:        65 % LV EDV:       161 ml LV ESV:       56 ml LV SV:        105 ml  RIGHT VENTRICLE             IVC RV Basal diam:  3.40 cm     IVC diam: 1.40 cm RV S prime:     12.30 cm/s TAPSE (M-mode): 1.9 cm      PULMONARY VEINS RVSP:           38.3 mmHg   A Reversal Velocity: 20.70 cm/s Diastolic Velocity:  41.30 cm/s S/D Velocity:        1.30 Systolic Velocity:   53.20 cm/s  LEFT ATRIUM              Index        RIGHT ATRIUM           Index LA diam:        4.70 cm  2.43 cm/m   RA Pressure: 3.00 mmHg LA Vol (A2C):   123.0 ml 63.66 ml/m  RA Area:     17.20 cm LA Vol (A4C):   72.9  ml  37.73 ml/m  RA Volume:   47.40 ml  24.53 ml/m LA Biplane Vol: 102.0 ml 52.79 ml/m AORTIC VALVE AV Area (Vmax):    1.05 cm AV Area (Vmean):   1.03 cm AV Area (VTI):     1.01 cm AV Vmax:           397.75 cm/s AV Vmean:          282.000 cm/s AV VTI:            0.963 m AV Peak Grad:      63.3 mmHg AV Mean Grad:      36.5 mmHg LVOT Vmax:         85.23  cm/s LVOT Vmean:        59.033 cm/s LVOT VTI:          0.199 m LVOT/AV VTI ratio: 0.21  AORTA Ao Root diam: 2.90 cm Ao Asc diam:  2.70 cm  MITRAL VALVE                TRICUSPID VALVE MV Area (PHT)  cm          TR Peak grad:   35.3 mmHg MV Decel Time: 220 msec     TR Vmax:        297.00 cm/s MV E velocity: 130.50 cm/s  Estimated RAP:  3.00 mmHg MV A velocity: 132.00 cm/s  RVSP:           38.3 mmHg MV E/A ratio:  0.99 SHUNTS Systemic VTI:  0.20 m Systemic Diam: 2.50 cm  Toribio Fuel MD Electronically signed by Toribio Fuel MD Signature Date/Time: 12/28/2023/5:43:26 PM    Final      CT SCANS  CT CORONARY MORPH W/CTA COR W/SCORE 01/30/2024  Addendum 02/06/2024  9:40 PM ADDENDUM REPORT: 02/06/2024 21:37  EXAM: OVER-READ INTERPRETATION  CT CHEST  The following report is an over-read performed by radiologist Dr. Andrea Gasman of [facility] on 02/06/2024. This over-read does not include interpretation of cardiac or coronary anatomy or pathology. The coronary CTA interpretation by the cardiologist is attached.  COMPARISON:  Full field of view chest CT performed concurrently, reported separately.  FINDINGS: Aortic atherosclerosis. Bronchial thickening is most prominent in the lower lobes with areas of mucoid impaction.  IMPRESSION: 1. Bronchial thickening with areas of mucoid impaction in the lower lobes. 2. Aortic Atherosclerosis (ICD10-I70.0). 3. Please reference concurrent chest CTA for full field of view assessment.   Electronically Signed By: Andrea Gasman M.D. On: 02/06/2024 21:37  Narrative CLINICAL DATA:  Severe Aortic Stenosis.  EXAM: Cardiac TAVR CT  TECHNIQUE: A non-contrast, gated CT scan was obtained with axial slices of 2.5 mm through the heart for aortic valve scoring. A 120 kV retrospective, gated, contrast cardiac scan was obtained. Gantry rotation speed was 230 msec and collimation was 0.63 mm. Nitroglycerin   was not given. A delayed scan was obtained to exclude left atrial appendage thrombus. The 3D dataset was reconstructed in systole with motion correction. The 3D data set was reconstructed in 5% intervals of the 0-95% of the R-R cycle. Systolic and diastolic phases were analyzed on a dedicated workstation using MPR, MIP, and VRT modes. The patient received 100 cc of contrast.  FINDINGS: Aortic Valve: Valve Morphology: Tricuspid, Asymmetric calcification and reduced excursion the planimeter valve area is 0.901 Sq cm consistent with severe aortic stenosis  Annular and subannular calcification: Single, mild protuberance adjacent to the left cusp.  Aortic Valve Calcium  score: 2309  Perimembranous  septal diameter: 5 mm  Mitral Valve: Calcified anterior and posterior leaflets mitral valve. Papillary muscle calcification.  Aortic Annulus Measurements-25% Phase  Major annulus diameter: 27 mm  Minor annulus diameter:22 mm  Annular perimeter: 76 mm  Annular area: 4.40 cm2  Aortic Measurements- 75% phase  Sinotubular Junction: 26 mm  Ascending Thoracic Aorta: 30 mm  Descending Thoracic Aorta: 24 mm  Aortic atherosclerosis.  Sinus of Valsalva Measurements:  Right coronary cusp width: 27 mm  Left coronary cusp width: 30 mm  Non coronary cusp width: 28 mm  Coronary Artery Height above Annulus:  Left Main: 21 mm  Left SoV height: 14 mm  Right Coronary: 17 mm  Right SoV height: 21 mm  Optimum Fluoroscopic Angle for Delivery: LAO 4, CAU 5  Valves for structural team consideration:  Valve is at the lower limit of a 26 mm Sapien valve but with only mild annular calcification. Given relative sinus effacement and known coronary disease, this valve may be preferred over a 29 mm Evolut valve.  Non TAVR Valve Findings:  Coronary Arteries: Normal coronary origin. Study not completed with nitroglycerin . Cannot exclude proximal disease from this study- recommend  correlation to heart catheterization.  Coronary Calcium  Score: Deferred in the setting of PCI.  Systemic veins: Not fully assessed.  Main Pulmonary artery: Normal caliber.  Pulmonary veins: Normal variant anatomy- left common pulmonary vein.  Left atrial appendage: No thrombus.  Interatrial septum: No clear communications  Chamber dimensions: Left atrial dilation.  Pericardium: No pericardial effusion.  Extra Cardiac Findings as per separate reporting.  Image quality: Excellent.  Noise artifact is: Limited.  IMPRESSION: 1. Severe Aortic stenosis. Measurements for potential interventions as above.  Stanly Leavens MD  Electronically Signed: By: Stanly Leavens M.D. On: 01/30/2024 11:48     ______________________________________________________________________________________________      ECG sinus    I have independently reviewed the above radiologic studies and discussed with the patient   Recent Lab Findings: Lab Results  Component Value Date   WBC 6.1 01/02/2024   HGB 12.6 (L) 01/07/2024   HGB 12.6 (L) 01/07/2024   HCT 37.0 (L) 01/07/2024   HCT 37.0 (L) 01/07/2024   PLT 164 01/02/2024   GLUCOSE 155 (H) 01/02/2024   CHOL 112 12/28/2023   TRIG 70 12/28/2023   HDL 58 12/28/2023   LDLCALC 40 12/28/2023   ALT 24 01/02/2024   AST 27 08/17/2022   NA 142 01/07/2024   NA 141 01/07/2024   K 3.9 01/07/2024   K 3.9 01/07/2024   CL 101 01/02/2024   CREATININE 2.64 (H) 01/02/2024   BUN 45 (H) 01/02/2024   CO2 21 01/02/2024   TSH 1.284 06/27/2016   INR 1.0 08/10/2022   HGBA1C 6.5 (H) 08/10/2022      Assessment / Plan:   89 y.o. male with severe aortic stenosis.  STS score: 7.87.  NYHA Class II.  The risks and benefits of transfemoral TAVR were discussed in detail.  We also discussed possibility of an emergent sternotomy to address any procedural complications.  Based on our discussion, we collectively decided that an emergent sternotomy would  not be indicated.  The patient is  agreeable to proceed.  Based on my review of her LHC, echo, and CTA, I agree with the multidisciplinary plan to proceed with a 26mm S3 transfemoral TAVR.      I  spent 40 minutes counseling the patient face to face.   Linnie MALVA Rayas 02/15/2024 3:50 PM

## 2024-02-14 NOTE — Progress Notes (Unsigned)
 [address] [place] [address]             [id number]      [facility] Medical Record #[id number] Date of Birth: [birth year]  Referring: Jeffrie Oneil BROCKS, MD Primary Care: Okey Carlin Redbird, MD Primary Cardiologist:Mark Jeffrie, MD  Chief Complaint:    Chief Complaint  Patient presents with   Aortic Stenosis    TAVR eval, review all studies    History of Present Illness:     Marco Acevedo is a 89 y.o. male presents for surgical evaluation of severe aortic stenosis.  He occasionally has some chest pressure, and reflux that are relieved with nitroglycerin .  He remains active, and plays golf.        Past Medical History:  Diagnosis Date   Anemia    low iron    Aortic stenosis    Arthritis    BPH (benign prostatic hyperplasia)    CAD (coronary artery disease)    Chronic diastolic CHF (congestive heart failure) (HCC)    CKD (chronic kidney disease), stage IV (HCC)    Claustrophobia    Complication of anesthesia    has to have head elevated to keep from strangling on post nasal drip   COPD (chronic obstructive pulmonary disease) (HCC)    Diabetes mellitus type II    type 2   GERD (gastroesophageal reflux disease)    Gout    Granuloma annulare    History of hiatal hernia    History of kidney stones    Litthotrispy   Hyperlipidemia    Hypertension    Neuropathy    Obesity    OSA (obstructive sleep apnea)    Stroke (HCC) 10/2017   Weakness right arm and leg    Past Surgical History:  Procedure Laterality Date   BLEPHAROPLASTY     CARDIAC CATHETERIZATION  [id number]   X 2 stents   CHOLECYSTECTOMY N/A 09/23/2014   Procedure: LAPAROSCOPIC CHOLECYSTECTOMY WITH INTRAOPERATIVE CHOLANGIOGRAM;  Surgeon: Deward Null [name], MD;  Location: [facility] OR;  Service: General;  Laterality: N/A;   COLONOSCOPY     COLONOSCOPY WITH PROPOFOL  N/A 03/22/2016   Procedure: COLONOSCOPY WITH PROPOFOL ;  Surgeon: Jerrell Sol, MD;  Location: [facility]  ENDOSCOPY;  Service: Endoscopy;  Laterality: N/A;   ENDARTERECTOMY Left 10/19/2017   Procedure: ENDARTERECTOMY CAROTID LEFT;  Surgeon: Oris Krystal FALCON, MD;  Location: [facility] OR;  Service: Vascular;  Laterality: Left;   ESOPHAGOGASTRODUODENOSCOPY (EGD) WITH PROPOFOL  N/A 03/22/2016   Procedure: ESOPHAGOGASTRODUODENOSCOPY (EGD) WITH PROPOFOL ;  Surgeon: Jerrell Sol, MD;  Location: [facility] ENDOSCOPY;  Service: Endoscopy;  Laterality: N/A;   EYE SURGERY     both cataracts   INTRAMEDULLARY (IM) NAIL INTERTROCHANTERIC Left 08/11/2022   Procedure: INTRAMEDULLARY (IM) NAIL INTERTROCHANTERIC;  Surgeon: Edna Toribio LABOR, MD;  Location: [facility] OR;  Service: Orthopedics;  Laterality: Left;   KNEE ARTHROSCOPY WITH MEDIAL MENISECTOMY Right 08/19/2013   Procedure: RIGHT KNEE ARTHROSCOPY WITH PARTIAL MEDIAL MENISECTOMY AND CHONDROPLASTY;  Surgeon: Maude KANDICE Herald, MD;  Location: [facility];  Service: Orthopedics;  Laterality: Right;   LEFT HEART CATH AND CORONARY ANGIOGRAPHY N/A 06/28/2016   Procedure: Left Heart Cath and Coronary Angiography;  Surgeon: Victory LELON Sharps, MD;  Location: [facility] INVASIVE CV LAB;  Service: Cardiovascular;  Laterality: N/A;   LEFT HEART CATH AND CORONARY ANGIOGRAPHY N/A 02/25/2019   Procedure: LEFT HEART CATH AND CORONARY ANGIOGRAPHY;  Surgeon: Sharps Victory LELON, MD;  Location: [facility] INVASIVE CV LAB;  Service: Cardiovascular;  Laterality: N/A;   LEFT HEART CATHETERIZATION WITH CORONARY ANGIOGRAM N/A 10/03/2011   Procedure: LEFT HEART CATHETERIZATION WITH CORONARY ANGIOGRAM;  Surgeon: Victory LELON Claudene DOUGLAS, MD;  Location: [facility] CATH LAB;  Service: Cardiovascular;  Laterality: N/A;   LEFT HEART CATHETERIZATION WITH CORONARY ANGIOGRAM N/A 09/12/2012   Procedure: LEFT HEART CATHETERIZATION WITH CORONARY ANGIOGRAM;  Surgeon: Victory LELON Claudene DOUGLAS, MD;  Location: [facility] CATH LAB;  Service: Cardiovascular;  Laterality: N/A;   LIPOMA EXCISION     Biospy only; left parotid gland   LITHOTRIPSY     none      PERCUTANEOUS CORONARY STENT INTERVENTION (PCI-S) N/A 09/17/2012   Procedure: PERCUTANEOUS CORONARY STENT INTERVENTION (PCI-S);  Surgeon: Victory LELON Claudene DOUGLAS, MD;  Location: [facility] CATH LAB;  Service: Cardiovascular;  Laterality: N/A;   RIGHT/LEFT HEART CATH AND CORONARY ANGIOGRAPHY N/A 01/07/2024   Procedure: RIGHT/LEFT HEART CATH AND CORONARY ANGIOGRAPHY;  Surgeon: Wonda Sharper, MD;  Location: [facility] INVASIVE CV LAB;  Service: Cardiovascular;  Laterality: N/A;   TRIGGER FINGER RELEASE Left 12/21/2015   Procedure: LEFT LONG FINGER TRIGGER RELEASE ;  Surgeon: Alm Hummer, MD;  Location: [facility];  Service: Orthopedics;  Laterality: Left;    Social History:  Social History   Tobacco Use  Smoking Status Former   Current packs/day: 0.00   Average packs/day: 1 pack/day for 64.0 years (64.0 ttl pk-yrs)   Types: Cigarettes   Start date: 05/29/1947   Quit date: 05/29/2011   Years since quitting: 12.7  Smokeless Tobacco Never    Social History   Substance and Sexual Activity  Alcohol Use Yes   Comment: rarely     Allergies  Allergen Reactions   Cimetidine Other (See Comments)    Anxiety Attacks   Metformin  Hcl     Other reaction(s): decreased kidney function      Current Outpatient Medications  Medication Sig Dispense Refill   albuterol  (VENTOLIN  HFA) 108 (90 Base) MCG/ACT inhaler Inhale 2 puffs into the lungs every 6 (six) hours as needed for wheezing or shortness of breath.      allopurinol  (ZYLOPRIM ) 100 MG tablet Take 100 mg by mouth daily.     amLODipine  (NORVASC ) 2.5 MG tablet Take 2.5 mg by mouth daily.     aspirin  EC 81 MG tablet Take 1 tablet (81 mg total) by mouth daily. 90 tablet 3   atorvastatin  (LIPITOR ) 80 MG tablet Take 1 tablet (80 mg total) by mouth daily at 6 PM. 30 tablet 0   capsaicin (ZOSTRIX) 0.025 % cream Apply 1 Application topically 2 (two) times daily as needed (pain).     carvedilol  (COREG ) 6.25 MG tablet Take 6.25 mg by mouth 2 (two) times  daily with a meal.     cholecalciferol (VITAMIN D3) 25 MCG (1000 UT) tablet Take 1,000 Units by mouth daily.     clopidogrel  (PLAVIX ) 75 MG tablet Take 1 tablet (75 mg total) by mouth daily. 30 tablet 0   dapagliflozin  propanediol (FARXIGA ) 10 MG TABS tablet Take 10 mg by mouth daily.     Docusate Sodium  (DSS) 100 MG CAPS as needed (constipation).     doxycycline  (VIBRAMYCIN ) 100 MG capsule Take 1 capsule (100 mg total) by mouth 2 (two) times daily. 10 capsule 0   Ferrous Sulfate  (IRON ) 325 (65 FE) MG TABS Take 325 mg by mouth daily.      finasteride  (PROSCAR ) 5 MG tablet Take 1 tablet (5 mg total) by  mouth daily. 30 tablet 5   gabapentin  (NEURONTIN ) 300 MG capsule Take 2 capsules (600 mg total) by mouth at bedtime. 60 capsule 0   guaifenesin  (HUMIBID E) 400 MG TABS tablet Take 400 mg by mouth 2 (two) times daily.     isosorbide  mononitrate (IMDUR ) 120 MG 24 hr tablet Take 1 tablet (120 mg total) by mouth daily. 90 tablet 3   nitroGLYCERIN  (NITROSTAT ) 0.4 MG SL tablet Place 0.4 mg under the tongue every 5 (five) minutes as needed for chest pain.     pantoprazole  (PROTONIX ) 40 MG tablet Take 40 mg by mouth daily.     primidone  (MYSOLINE ) 50 MG tablet Take 50 mg by mouth at bedtime.      sucralfate  (CARAFATE ) 1 GM/10ML suspension Take 1 g by mouth 2 (two) times daily as needed (heartburn).     traMADol  (ULTRAM ) 50 MG tablet Take 1 tablet (50 mg total) by mouth at bedtime as needed for moderate pain. (Patient taking differently: Take 100 mg by mouth at bedtime.)     vitamin B-12 (CYANOCOBALAMIN ) 1000 MCG tablet Take 1,000 mcg by mouth daily.     No current facility-administered medications for this visit.    (Not in a hospital admission)   Family History  Problem Relation Age of Onset   Aortic aneurysm Father    Aneurysm Mother        brain   Cancer Brother    COPD Sister    Other Sister        health hx unknown   Other Sister        health hx unknown   Other Brother        health hx  unknown     Review of Systems:   Review of Systems  Constitutional:  Positive for malaise/fatigue.  Respiratory:  Positive for shortness of breath.   Cardiovascular:  Positive for chest pain.  Neurological:  Negative for dizziness.      Physical Exam: BP (!) 141/78 (BP Location: Left Arm, Patient Position: Sitting, Cuff Size: Normal)   Pulse (!) 57   Resp 20   Wt 188 lb (85.3 kg)   SpO2 96% Comment: RA  BMI 28.59 kg/m  Physical Exam Constitutional:      General: He is not in acute distress.    Appearance: He is not ill-appearing.  HENT:     Head: Normocephalic and atraumatic.  Eyes:     Extraocular Movements: Extraocular movements intact.  Cardiovascular:     Rate and Rhythm: Bradycardia present.  Pulmonary:     Effort: Pulmonary effort is normal. No respiratory distress.  Abdominal:     General: Abdomen is flat. There is no distension.  Musculoskeletal:        General: Normal range of motion.     Cervical back: Normal range of motion.  Skin:    General: Skin is warm and dry.  Neurological:     General: No focal deficit present.     Mental Status: He is alert and oriented to person, place, and time.       Cardiac Studies & Procedures   ______________________________________________________________________________________________ CARDIAC CATHETERIZATION  CARDIAC CATHETERIZATION 06/28/2016  Conclusion  No major change since the post PCI angiogram in 2014.  There is calcification with globular proximal LAD 40% stenosis.  The circumflex ostium contains eccentric 40% narrowing followed by proximal circumflex stent which contains up to 50% ISR. The non-stented mid segment contains diffuse 50-60% narrowing. The distal first obtuse marginal stent  contains 50-60% proximal edge in-stent restenosis.  The right coronary contains diffuse disease up to 50% throughout the proximal mid and distal segment.  Left ventriculography was not performed because of kidney  impairment. LVEDP was markedly elevated at 27 mmHg.  RECOMMENDATIONS:   Aggressive blood pressure control. Add amlodipine  5 mg daily. May need low-dose diuretic therapy  Sublingual nitroglycerin  as needed to control angina. Continue Ranexa  as previously prescribed.  Clinical follow-up in 7-10 days.  Follow kidney function closely after contrast exposure, 95 cc used.  Findings Coronary Findings Diagnostic  Dominance: Co-dominant  Left Anterior Descending The lesion is eccentric. The lesion is calcified.  First Diagonal Branch Vessel is moderate in size.  Second Diagonal Branch Vessel is small in size.  Third Diagonal Branch Vessel is small in size.  Ramus Intermedius Vessel is small.  Left Circumflex  The lesion was previously treated.  First Obtuse Marginal Branch  The lesion was previously treated.  Second Obtuse Marginal Branch Vessel is small in size.  Third Obtuse Marginal Branch Vessel is small in size.  Right Coronary Artery  Right Posterior Descending Artery Vessel is small in size.  Right Posterior Atrioventricular Artery Vessel is small in size.  Intervention  No interventions have been documented.   CARDIAC CATHETERIZATION  CARDIAC CATHETERIZATION 01/07/2024  Conclusion 1.  Stable coronary artery disease with patent left main, patent LAD with mild plaquing, patent circumflex with moderate proximal stenosis of 50 to 60% with patent stents, and patent RCA with mild to moderate nonobstructive plaquing 2.  Severe aortic stenosis with peak gradient 43 mmHg, mean gradient 37 mmHg, calculated aortic valve area 0.78 cm 3.  Right heart catheterization data: RA mean 8 mmHg RV 33/11 mmHg PA 32/15 mean 23 mmHg Pulmonary wedge pressure A-wave 17, V wave 18, mean 19 mmHg  LV pressure 206/18 Aortic pressure 164/72  Cardiac output/index 5.78/2.95  Recommendation: Structural Heart/Surgical consultations for TAVR. May need renal clearance for CTA  studies.  Findings Coronary Findings Diagnostic  Dominance: Right  Left Main There is mild diffuse disease throughout the vessel. Left main is patent with mild nonobstructive plaquing.  Left Anterior Descending The LAD is patent to the apex.  There is no severe stenosis throughout the vessel.  There is mild plaquing in the proximal vessel without any significant obstruction. Prox LAD to Mid LAD lesion is 40% stenosed.  First Diagonal Branch Vessel is small in size. 1st Diag lesion is 85% stenosed.  Left Circumflex The circumflex is patent with moderate in-stent restenosis in the proximal aspect of the circumflex.  There is no tight stenosis.  The stent in the obtuse marginal is patent with no stenosis. Ost Cx lesion is 65% stenosed. Ost Cx to Prox Cx lesion is 50% stenosed. The lesion was previously treated .  First Obtuse Marginal Branch 1st Mrg-1 lesion is 70% stenosed. 1st Mrg-2 lesion is 30% stenosed. The lesion was previously treated .  Second Obtuse Marginal Branch Vessel is small in size.  Third Obtuse Marginal Branch Vessel is small in size.  Right Coronary Artery Patient with mild to moderate diffuse plaquing but no high-grade stenoses.  The PDA is diffusely diseased. Prox RCA to Mid RCA lesion is 40% stenosed. Dist RCA lesion is 40% stenosed.  Right Posterior Descending Artery RPDA lesion is 60% stenosed.  Intervention  No interventions have been documented.   STRESS TESTS  MYOCARDIAL PERFUSION IMAGING 11/25/2014  Interpretation Summary  Nuclear stress EF: 73%.  There was no ST segment deviation noted during  stress.  This is an intermediate risk study.  The left ventricular ejection fraction is hyperdynamic (>65%).  There was a medium-sized, mild primarily reversible basal to mid inferolateral perfusion defect and a small reversible apical septal perfusion defect with normal wall motion.  It is possible that this represents primarily ischemia, but  the stress study is overall count poor compared to the rest.  Overall, probably intermediate risk due to the extent of the defects.   ECHOCARDIOGRAM  ECHOCARDIOGRAM COMPLETE 12/28/2023  Narrative ECHOCARDIOGRAM REPORT    Patient Name:   GERSON FAUTH Date of Exam: 12/28/2023 Medical Rec #:  [id number]      Height:       65.0 in Accession #:    [id number]     Weight:       189.2 lb Date of Birth:  06/18/1934       BSA:          1.932 m Patient Age:    89 years       BP:           134/68 mmHg Patient Gender: M              HR:           75 bpm. Exam Location:  [address]  Procedure: 2D Echo, 3D Echo, Cardiac Doppler and Color Doppler (Both Spectral and Color Flow Doppler were utilized during procedure).  Indications:    I50.9 CHF  History:        Patient has prior history of Echocardiogram examinations. CHF, CAD, Stroke and COPD, Aortic Valve Disease, Signs/Symptoms:Chest Pain; Risk Factors:Hypertension, Family History of Coronary Artery Disease, Diabetes, Dyslipidemia, Sleep Apnea and Former Smoker. Obesity, Chronic Kindney Disease, Aortic Stenosis.  Sonographer:    Heather Hawks RDCS Referring Phys: ONEIL JAYSON PARCHMENT  IMPRESSIONS   1. Left ventricular ejection fraction, by estimation, is 65 to 70%. Left ventricular ejection fraction by 3D volume is 65 %. The left ventricle has normal function. The left ventricle has no regional wall motion abnormalities. There is mild concentric left ventricular hypertrophy. Left ventricular diastolic parameters are consistent with Grade I diastolic dysfunction (impaired relaxation). 2. Right ventricular systolic function is normal. The right ventricular size is normal. 3. Left atrial size was moderately dilated. 4. The mitral valve is degenerative. Trivial mitral valve regurgitation. No evidence of mitral stenosis. Moderate mitral annular calcification. 5. The aortic valve is tricuspid. There is severe calcifcation of the aortic valve. Aortic  valve regurgitation is not visualized. Moderate to severe aortic valve stenosis. Aortic valve area, by VTI measures 1.01 cm. Aortic valve mean gradient measures 36.5 mmHg. Aortic valve Vmax measures 3.98 m/s. 6. The inferior vena cava is normal in size with greater than 50% respiratory variability, suggesting right atrial pressure of 3 mmHg.  FINDINGS Left Ventricle: Left ventricular ejection fraction, by estimation, is 65 to 70%. Left ventricular ejection fraction by 3D volume is 65 %. The left ventricle has normal function. The left ventricle has no regional wall motion abnormalities. The left ventricular internal cavity size was normal in size. There is mild concentric left ventricular hypertrophy. Left ventricular diastolic parameters are consistent with Grade I diastolic dysfunction (impaired relaxation).  Right Ventricle: The right ventricular size is normal. No increase in right ventricular wall thickness. Right ventricular systolic function is normal.  Left Atrium: Left atrial size was moderately dilated.  Right Atrium: Right atrial size was normal in size.  Pericardium: There is no evidence of pericardial effusion.  Mitral Valve: The mitral valve is degenerative in appearance. There is moderate calcification of the mitral valve leaflet(s). Moderate mitral annular calcification. Trivial mitral valve regurgitation. No evidence of mitral valve stenosis.  Tricuspid Valve: The tricuspid valve is normal in structure. Tricuspid valve regurgitation is mild . No evidence of tricuspid stenosis.  Aortic Valve: The aortic valve is tricuspid. There is severe calcifcation of the aortic valve. Aortic valve regurgitation is not visualized. Moderate to severe aortic stenosis is present. Aortic valve mean gradient measures 36.5 mmHg. Aortic valve peak gradient measures 63.3 mmHg. Aortic valve area, by VTI measures 1.01 cm.  Pulmonic Valve: The pulmonic valve was normal in structure. Pulmonic valve  regurgitation is trivial. No evidence of pulmonic stenosis.  Aorta: The aortic root is normal in size and structure.  Venous: The inferior vena cava is normal in size with greater than 50% respiratory variability, suggesting right atrial pressure of 3 mmHg.  IAS/Shunts: No atrial level shunt detected by color flow Doppler.  Additional Comments: 3D was performed not requiring image post processing on an independent workstation and was normal.   LEFT VENTRICLE PLAX 2D LVIDd:         4.50 cm         Diastology LVIDs:         2.60 cm         LV e' medial:    5.11 cm/s LV PW:         1.05 cm         LV E/e' medial:  25.5 LV IVS:        1.20 cm         LV e' lateral:   5.88 cm/s LVOT diam:     2.50 cm         LV E/e' lateral: 22.2 LV SV:         98 LV SV Index:   51 LVOT Area:     4.91 cm        3D Volume EF LV IVRT:       77 msec         LV 3D EF:    Left ventricul ar ejection fraction by 3D volume is 65 %.  3D Volume EF: 3D EF:        65 % LV EDV:       161 ml LV ESV:       56 ml LV SV:        105 ml  RIGHT VENTRICLE             IVC RV Basal diam:  3.40 cm     IVC diam: 1.40 cm RV S prime:     12.30 cm/s TAPSE (M-mode): 1.9 cm      PULMONARY VEINS RVSP:           38.3 mmHg   A Reversal Velocity: 20.70 cm/s Diastolic Velocity:  41.30 cm/s S/D Velocity:        1.30 Systolic Velocity:   53.20 cm/s  LEFT ATRIUM              Index        RIGHT ATRIUM           Index LA diam:        4.70 cm  2.43 cm/m   RA Pressure: 3.00 mmHg LA Vol (A2C):   123.0 ml 63.66 ml/m  RA Area:     17.20 cm LA Vol (A4C):   72.9  ml  37.73 ml/m  RA Volume:   47.40 ml  24.53 ml/m LA Biplane Vol: 102.0 ml 52.79 ml/m AORTIC VALVE AV Area (Vmax):    1.05 cm AV Area (Vmean):   1.03 cm AV Area (VTI):     1.01 cm AV Vmax:           397.75 cm/s AV Vmean:          282.000 cm/s AV VTI:            0.963 m AV Peak Grad:      63.3 mmHg AV Mean Grad:      36.5 mmHg LVOT Vmax:         85.23  cm/s LVOT Vmean:        59.033 cm/s LVOT VTI:          0.199 m LVOT/AV VTI ratio: 0.21  AORTA Ao Root diam: 2.90 cm Ao Asc diam:  2.70 cm  MITRAL VALVE                TRICUSPID VALVE MV Area (PHT)  cm          TR Peak grad:   35.3 mmHg MV Decel Time: 220 msec     TR Vmax:        297.00 cm/s MV E velocity: 130.50 cm/s  Estimated RAP:  3.00 mmHg MV A velocity: 132.00 cm/s  RVSP:           38.3 mmHg MV E/A ratio:  0.99 SHUNTS Systemic VTI:  0.20 m Systemic Diam: 2.50 cm  Toribio Fuel MD Electronically signed by Toribio Fuel MD Signature Date/Time: 12/28/2023/5:43:26 PM    Final      CT SCANS  CT CORONARY MORPH W/CTA COR W/SCORE 01/30/2024  Addendum 02/06/2024  9:40 PM ADDENDUM REPORT: 02/06/2024 21:37  EXAM: OVER-READ INTERPRETATION  CT CHEST  The following report is an over-read performed by radiologist Dr. Andrea Gasman of [facility] on 02/06/2024. This over-read does not include interpretation of cardiac or coronary anatomy or pathology. The coronary CTA interpretation by the cardiologist is attached.  COMPARISON:  Full field of view chest CT performed concurrently, reported separately.  FINDINGS: Aortic atherosclerosis. Bronchial thickening is most prominent in the lower lobes with areas of mucoid impaction.  IMPRESSION: 1. Bronchial thickening with areas of mucoid impaction in the lower lobes. 2. Aortic Atherosclerosis (ICD10-I70.0). 3. Please reference concurrent chest CTA for full field of view assessment.   Electronically Signed By: Andrea Gasman M.D. On: 02/06/2024 21:37  Narrative CLINICAL DATA:  Severe Aortic Stenosis.  EXAM: Cardiac TAVR CT  TECHNIQUE: A non-contrast, gated CT scan was obtained with axial slices of 2.5 mm through the heart for aortic valve scoring. A 120 kV retrospective, gated, contrast cardiac scan was obtained. Gantry rotation speed was 230 msec and collimation was 0.63 mm. Nitroglycerin   was not given. A delayed scan was obtained to exclude left atrial appendage thrombus. The 3D dataset was reconstructed in systole with motion correction. The 3D data set was reconstructed in 5% intervals of the 0-95% of the R-R cycle. Systolic and diastolic phases were analyzed on a dedicated workstation using MPR, MIP, and VRT modes. The patient received 100 cc of contrast.  FINDINGS: Aortic Valve: Valve Morphology: Tricuspid, Asymmetric calcification and reduced excursion the planimeter valve area is 0.901 Sq cm consistent with severe aortic stenosis  Annular and subannular calcification: Single, mild protuberance adjacent to the left cusp.  Aortic Valve Calcium  score: 2309  Perimembranous  septal diameter: 5 mm  Mitral Valve: Calcified anterior and posterior leaflets mitral valve. Papillary muscle calcification.  Aortic Annulus Measurements-25% Phase  Major annulus diameter: 27 mm  Minor annulus diameter:22 mm  Annular perimeter: 76 mm  Annular area: 4.40 cm2  Aortic Measurements- 75% phase  Sinotubular Junction: 26 mm  Ascending Thoracic Aorta: 30 mm  Descending Thoracic Aorta: 24 mm  Aortic atherosclerosis.  Sinus of Valsalva Measurements:  Right coronary cusp width: 27 mm  Left coronary cusp width: 30 mm  Non coronary cusp width: 28 mm  Coronary Artery Height above Annulus:  Left Main: 21 mm  Left SoV height: 14 mm  Right Coronary: 17 mm  Right SoV height: 21 mm  Optimum Fluoroscopic Angle for Delivery: LAO 4, CAU 5  Valves for structural team consideration:  Valve is at the lower limit of a 26 mm Sapien valve but with only mild annular calcification. Given relative sinus effacement and known coronary disease, this valve may be preferred over a 29 mm Evolut valve.  Non TAVR Valve Findings:  Coronary Arteries: Normal coronary origin. Study not completed with nitroglycerin . Cannot exclude proximal disease from this study- recommend  correlation to heart catheterization.  Coronary Calcium  Score: Deferred in the setting of PCI.  Systemic veins: Not fully assessed.  Main Pulmonary artery: Normal caliber.  Pulmonary veins: Normal variant anatomy- left common pulmonary vein.  Left atrial appendage: No thrombus.  Interatrial septum: No clear communications  Chamber dimensions: Left atrial dilation.  Pericardium: No pericardial effusion.  Extra Cardiac Findings as per separate reporting.  Image quality: Excellent.  Noise artifact is: Limited.  IMPRESSION: 1. Severe Aortic stenosis. Measurements for potential interventions as above.  Stanly Leavens MD  Electronically Signed: By: Stanly Leavens M.D. On: 01/30/2024 11:48     ______________________________________________________________________________________________      ECG sinus    I have independently reviewed the above radiologic studies and discussed with the patient   Recent Lab Findings: Lab Results  Component Value Date   WBC 6.1 01/02/2024   HGB 12.6 (L) 01/07/2024   HGB 12.6 (L) 01/07/2024   HCT 37.0 (L) 01/07/2024   HCT 37.0 (L) 01/07/2024   PLT 164 01/02/2024   GLUCOSE 155 (H) 01/02/2024   CHOL 112 12/28/2023   TRIG 70 12/28/2023   HDL 58 12/28/2023   LDLCALC 40 12/28/2023   ALT 24 01/02/2024   AST 27 08/17/2022   NA 142 01/07/2024   NA 141 01/07/2024   K 3.9 01/07/2024   K 3.9 01/07/2024   CL 101 01/02/2024   CREATININE 2.64 (H) 01/02/2024   BUN 45 (H) 01/02/2024   CO2 21 01/02/2024   TSH 1.284 06/27/2016   INR 1.0 08/10/2022   HGBA1C 6.5 (H) 08/10/2022      Assessment / Plan:   89 y.o. male with severe aortic stenosis.  STS score: 7.87.  NYHA Class II.  The risks and benefits of transfemoral TAVR were discussed in detail.  We also discussed possibility of an emergent sternotomy to address any procedural complications.  Based on our discussion, we collectively decided that an emergent sternotomy would  not be indicated.  The patient is  agreeable to proceed.  Based on my review of her LHC, echo, and CTA, I agree with the multidisciplinary plan to proceed with a 26mm S3 transfemoral TAVR.      I  spent 40 minutes counseling the patient face to face.   Linnie MALVA Rayas 02/15/2024 3:50 PM

## 2024-02-15 ENCOUNTER — Ambulatory Visit
Payer: Medicare (Managed Care) | Attending: Thoracic Surgery (Cardiothoracic Vascular Surgery) | Admitting: Thoracic Surgery (Cardiothoracic Vascular Surgery)

## 2024-02-15 ENCOUNTER — Encounter: Payer: Self-pay | Admitting: Thoracic Surgery (Cardiothoracic Vascular Surgery)

## 2024-02-15 VITALS — BP 141/78 | HR 57 | Resp 20 | Wt 188.0 lb

## 2024-02-15 DIAGNOSIS — I350 Nonrheumatic aortic (valve) stenosis: Secondary | ICD-10-CM | POA: Diagnosis not present

## 2024-02-20 ENCOUNTER — Other Ambulatory Visit: Payer: Self-pay

## 2024-02-20 DIAGNOSIS — I350 Nonrheumatic aortic (valve) stenosis: Secondary | ICD-10-CM

## 2024-02-29 ENCOUNTER — Other Ambulatory Visit: Payer: Self-pay

## 2024-02-29 ENCOUNTER — Ambulatory Visit (HOSPITAL_COMMUNITY)
Admission: RE | Admit: 2024-02-29 | Discharge: 2024-02-29 | Disposition: A | Discharge status: Home / Self Care | Payer: Medicare (Managed Care) | Source: Ambulatory Visit | Attending: Cardiovascular Disease | Admitting: Cardiovascular Disease

## 2024-02-29 ENCOUNTER — Encounter (HOSPITAL_COMMUNITY)
Admission: RE | Admit: 2024-02-29 | Discharge: 2024-02-29 | Disposition: A | Discharge status: Home / Self Care | Payer: Medicare (Managed Care) | Source: Ambulatory Visit | Attending: Cardiovascular Disease | Admitting: Cardiovascular Disease

## 2024-02-29 DIAGNOSIS — I350 Nonrheumatic aortic (valve) stenosis: Secondary | ICD-10-CM | POA: Insufficient documentation

## 2024-02-29 DIAGNOSIS — Z01818 Encounter for other preprocedural examination: Secondary | ICD-10-CM | POA: Insufficient documentation

## 2024-02-29 LAB — URINALYSIS, ROUTINE W REFLEX MICROSCOPIC
Bacteria, UA: NONE SEEN
Bilirubin Urine: NEGATIVE
Glucose, UA: >=500 mg/dL — AB
Ketones, ur: NEGATIVE mg/dL
Leukocytes,Ua: NEGATIVE
Nitrite: NEGATIVE
Protein, ur: 100 mg/dL — AB
Specific Gravity, Urine: 1.011 (ref 1.005–1.030)
pH: 5 (ref 5.0–8.0)

## 2024-02-29 LAB — PROTIME-INR
INR: 1 (ref 0.8–1.2)
Prothrombin Time: 14.2 s (ref 11.4–15.2)

## 2024-02-29 LAB — COMPREHENSIVE METABOLIC PANEL WITH GFR
ALT: 29 U/L (ref 0–44)
AST: 27 U/L (ref 15–41)
Albumin: 3.9 g/dL (ref 3.5–5.0)
Alkaline Phosphatase: 95 U/L (ref 38–126)
Anion gap: 7 (ref 5–15)
BUN: 46 mg/dL — ABNORMAL HIGH (ref 8–23)
CO2: 26 mmol/L (ref 22–32)
Calcium: 8.9 mg/dL (ref 8.9–10.3)
Chloride: 104 mmol/L (ref 98–111)
Creatinine, Ser: 2.77 mg/dL — ABNORMAL HIGH (ref 0.61–1.24)
GFR, Estimated: 21 mL/min — ABNORMAL LOW (ref >60)
Glucose, Bld: 160 mg/dL — ABNORMAL HIGH (ref 70–99)
Potassium: 4.3 mmol/L (ref 3.5–5.1)
Sodium: 137 mmol/L (ref 135–145)
Total Bilirubin: 1.5 mg/dL — ABNORMAL HIGH (ref 0.0–1.2)
Total Protein: 6.7 g/dL (ref 6.5–8.1)

## 2024-02-29 LAB — TYPE AND SCREEN
ABO/RH(D): B POS
Antibody Screen: NEGATIVE

## 2024-02-29 LAB — CBC
HCT: 40.1 % (ref 39.0–52.0)
Hemoglobin: 13.2 g/dL (ref 13.0–17.0)
MCH: 29.7 pg (ref 26.0–34.0)
MCHC: 32.9 g/dL (ref 30.0–36.0)
MCV: 90.3 fL (ref 80.0–100.0)
Platelets: 152 K/uL (ref 150–400)
RBC: 4.44 MIL/uL (ref 4.22–5.81)
RDW: 14 % (ref 11.5–15.5)
WBC: 5 K/uL (ref 4.0–10.5)
nRBC: 0 % (ref 0.0–0.2)

## 2024-02-29 LAB — SURGICAL PCR SCREEN
MRSA, PCR: NEGATIVE
Staphylococcus aureus: NEGATIVE

## 2024-02-29 NOTE — Progress Notes (Signed)
 All consents signed by patient at PAT lab appointment. Pt was sent home with printed copy of surgical instructions and CHG soap/CHG soap instructions. All instructions reviewed with patient and questions answered.  Patients chart send to anesthesia for review. Pt denies any respiratory illness/infection in the last two months.

## 2024-03-03 MED ORDER — DEXMEDETOMIDINE HCL IN NACL 400 MCG/100ML IV SOLN
0.1000 ug/kg/h | INTRAVENOUS | Status: AC
  Administered 2024-03-04: 85.32 ug via INTRAVENOUS
  Administered 2024-03-04: 1 ug/kg/h via INTRAVENOUS
  Filled 2024-03-03: qty 100

## 2024-03-03 MED ORDER — NOREPINEPHRINE 4 MG/250ML-% IV SOLN
0.0000 ug/min | INTRAVENOUS | Status: DC
  Filled 2024-03-03: qty 250

## 2024-03-03 MED ORDER — POTASSIUM CHLORIDE 2 MEQ/ML IV SOLN
80.0000 meq | INTRAVENOUS | Status: DC
  Filled 2024-03-03: qty 40

## 2024-03-03 MED ORDER — CEFAZOLIN SODIUM-DEXTROSE 2-4 GM/100ML-% IV SOLN
2.0000 g | INTRAVENOUS | Status: AC
  Administered 2024-03-04: 2 g via INTRAVENOUS
  Filled 2024-03-03: qty 100

## 2024-03-03 MED ORDER — MAGNESIUM SULFATE 50 % IJ SOLN
40.0000 meq | INTRAMUSCULAR | Status: DC
  Filled 2024-03-03: qty 9.85

## 2024-03-03 MED ORDER — HEPARIN 30,000 UNITS/1000 ML (OHS) CELLSAVER SOLUTION
Status: DC
  Filled 2024-03-03: qty 1000

## 2024-03-04 ENCOUNTER — Inpatient Hospital Stay (HOSPITAL_COMMUNITY)
Admission: RE | Admit: 2024-03-04 | Discharge: 2024-03-05 | DRG: 266 | Disposition: A | Discharge status: Home / Self Care | Payer: Medicare (Managed Care) | Attending: Thoracic Surgery (Cardiothoracic Vascular Surgery) | Admitting: Thoracic Surgery (Cardiothoracic Vascular Surgery)

## 2024-03-04 ENCOUNTER — Encounter (HOSPITAL_COMMUNITY): Payer: Self-pay | Admitting: Cardiovascular Disease

## 2024-03-04 ENCOUNTER — Encounter (HOSPITAL_COMMUNITY): Admitting: Physician Assistant

## 2024-03-04 ENCOUNTER — Inpatient Hospital Stay (HOSPITAL_COMMUNITY)

## 2024-03-04 ENCOUNTER — Encounter (HOSPITAL_COMMUNITY)
Admission: RE | Disposition: A | Discharge status: Home / Self Care | Payer: Medicare (Managed Care) | Source: Home / Self Care | Attending: Cardiovascular Disease

## 2024-03-04 ENCOUNTER — Inpatient Hospital Stay (HOSPITAL_COMMUNITY): Admitting: Certified Registered Nurse Anesthetist

## 2024-03-04 ENCOUNTER — Other Ambulatory Visit: Payer: Self-pay

## 2024-03-04 DIAGNOSIS — Z952 Presence of prosthetic heart valve: Principal | ICD-10-CM

## 2024-03-04 DIAGNOSIS — N189 Chronic kidney disease, unspecified: Secondary | ICD-10-CM | POA: Diagnosis not present

## 2024-03-04 DIAGNOSIS — I3139 Other pericardial effusion (noninflammatory): Secondary | ICD-10-CM | POA: Diagnosis present

## 2024-03-04 DIAGNOSIS — Z7982 Long term (current) use of aspirin: Secondary | ICD-10-CM

## 2024-03-04 DIAGNOSIS — Z888 Allergy status to other drugs, medicaments and biological substances status: Secondary | ICD-10-CM

## 2024-03-04 DIAGNOSIS — E119 Type 2 diabetes mellitus without complications: Secondary | ICD-10-CM

## 2024-03-04 DIAGNOSIS — I63422 Cerebral infarction due to embolism of left anterior cerebral artery: Secondary | ICD-10-CM | POA: Diagnosis not present

## 2024-03-04 DIAGNOSIS — I350 Nonrheumatic aortic (valve) stenosis: Secondary | ICD-10-CM | POA: Diagnosis not present

## 2024-03-04 DIAGNOSIS — R079 Chest pain, unspecified: Secondary | ICD-10-CM

## 2024-03-04 DIAGNOSIS — I509 Heart failure, unspecified: Secondary | ICD-10-CM

## 2024-03-04 DIAGNOSIS — G4733 Obstructive sleep apnea (adult) (pediatric): Secondary | ICD-10-CM | POA: Diagnosis present

## 2024-03-04 DIAGNOSIS — N400 Enlarged prostate without lower urinary tract symptoms: Secondary | ICD-10-CM | POA: Diagnosis present

## 2024-03-04 DIAGNOSIS — E785 Hyperlipidemia, unspecified: Secondary | ICD-10-CM | POA: Diagnosis not present

## 2024-03-04 DIAGNOSIS — Z006 Encounter for examination for normal comparison and control in clinical research program: Secondary | ICD-10-CM

## 2024-03-04 DIAGNOSIS — I10 Essential (primary) hypertension: Secondary | ICD-10-CM | POA: Diagnosis not present

## 2024-03-04 DIAGNOSIS — I959 Hypotension, unspecified: Secondary | ICD-10-CM | POA: Diagnosis not present

## 2024-03-04 DIAGNOSIS — D649 Anemia, unspecified: Secondary | ICD-10-CM | POA: Diagnosis present

## 2024-03-04 DIAGNOSIS — D696 Thrombocytopenia, unspecified: Secondary | ICD-10-CM | POA: Diagnosis present

## 2024-03-04 DIAGNOSIS — I2510 Atherosclerotic heart disease of native coronary artery without angina pectoris: Secondary | ICD-10-CM | POA: Diagnosis present

## 2024-03-04 DIAGNOSIS — G5600 Carpal tunnel syndrome, unspecified upper limb: Secondary | ICD-10-CM | POA: Diagnosis present

## 2024-03-04 DIAGNOSIS — K219 Gastro-esophageal reflux disease without esophagitis: Secondary | ICD-10-CM | POA: Diagnosis present

## 2024-03-04 DIAGNOSIS — I447 Left bundle-branch block, unspecified: Secondary | ICD-10-CM | POA: Diagnosis not present

## 2024-03-04 DIAGNOSIS — Z955 Presence of coronary angioplasty implant and graft: Secondary | ICD-10-CM

## 2024-03-04 DIAGNOSIS — E1142 Type 2 diabetes mellitus with diabetic polyneuropathy: Secondary | ICD-10-CM | POA: Diagnosis present

## 2024-03-04 DIAGNOSIS — N184 Chronic kidney disease, stage 4 (severe): Secondary | ICD-10-CM | POA: Diagnosis present

## 2024-03-04 DIAGNOSIS — I63412 Cerebral infarction due to embolism of left middle cerebral artery: Secondary | ICD-10-CM | POA: Diagnosis not present

## 2024-03-04 DIAGNOSIS — E1151 Type 2 diabetes mellitus with diabetic peripheral angiopathy without gangrene: Secondary | ICD-10-CM | POA: Diagnosis present

## 2024-03-04 DIAGNOSIS — I110 Hypertensive heart disease with heart failure: Secondary | ICD-10-CM

## 2024-03-04 DIAGNOSIS — M109 Gout, unspecified: Secondary | ICD-10-CM | POA: Diagnosis present

## 2024-03-04 DIAGNOSIS — I69351 Hemiplegia and hemiparesis following cerebral infarction affecting right dominant side: Secondary | ICD-10-CM | POA: Diagnosis not present

## 2024-03-04 DIAGNOSIS — Z825 Family history of asthma and other chronic lower respiratory diseases: Secondary | ICD-10-CM

## 2024-03-04 DIAGNOSIS — Z79899 Other long term (current) drug therapy: Secondary | ICD-10-CM

## 2024-03-04 DIAGNOSIS — E1122 Type 2 diabetes mellitus with diabetic chronic kidney disease: Secondary | ICD-10-CM | POA: Diagnosis present

## 2024-03-04 DIAGNOSIS — I7102 Dissection of abdominal aorta: Secondary | ICD-10-CM | POA: Diagnosis present

## 2024-03-04 DIAGNOSIS — Z7902 Long term (current) use of antithrombotics/antiplatelets: Secondary | ICD-10-CM

## 2024-03-04 DIAGNOSIS — I25110 Atherosclerotic heart disease of native coronary artery with unstable angina pectoris: Secondary | ICD-10-CM

## 2024-03-04 DIAGNOSIS — Z87891 Personal history of nicotine dependence: Secondary | ICD-10-CM

## 2024-03-04 DIAGNOSIS — I5032 Chronic diastolic (congestive) heart failure: Secondary | ICD-10-CM | POA: Diagnosis present

## 2024-03-04 DIAGNOSIS — J449 Chronic obstructive pulmonary disease, unspecified: Secondary | ICD-10-CM | POA: Diagnosis present

## 2024-03-04 DIAGNOSIS — F40240 Claustrophobia: Secondary | ICD-10-CM | POA: Diagnosis present

## 2024-03-04 DIAGNOSIS — I130 Hypertensive heart and chronic kidney disease with heart failure and stage 1 through stage 4 chronic kidney disease, or unspecified chronic kidney disease: Secondary | ICD-10-CM | POA: Diagnosis present

## 2024-03-04 DIAGNOSIS — I639 Cerebral infarction, unspecified: Secondary | ICD-10-CM | POA: Diagnosis not present

## 2024-03-04 DIAGNOSIS — G250 Essential tremor: Secondary | ICD-10-CM | POA: Diagnosis present

## 2024-03-04 DIAGNOSIS — R5381 Other malaise: Secondary | ICD-10-CM | POA: Diagnosis present

## 2024-03-04 DIAGNOSIS — I779 Disorder of arteries and arterioles, unspecified: Secondary | ICD-10-CM | POA: Diagnosis present

## 2024-03-04 HISTORY — PX: INTRAOPERATIVE TRANSTHORACIC ECHOCARDIOGRAM: SHX6523

## 2024-03-04 LAB — HEMOGLOBIN AND HEMATOCRIT, BLOOD
HCT: 35.1 % — ABNORMAL LOW (ref 39.0–52.0)
Hemoglobin: 11.3 g/dL — ABNORMAL LOW (ref 13.0–17.0)

## 2024-03-04 LAB — GLUCOSE, CAPILLARY
Glucose-Capillary: 171 mg/dL — ABNORMAL HIGH (ref 70–99)
Glucose-Capillary: 149 mg/dL — ABNORMAL HIGH (ref 70–99)
Glucose-Capillary: 152 mg/dL — ABNORMAL HIGH (ref 70–99)
Glucose-Capillary: 159 mg/dL — ABNORMAL HIGH (ref 70–99)

## 2024-03-04 LAB — POCT I-STAT, CHEM 8
BUN: 40 mg/dL — ABNORMAL HIGH (ref 8–23)
Calcium, Ion: 1.18 mmol/L (ref 1.15–1.40)
Chloride: 106 mmol/L (ref 98–111)
Creatinine, Ser: 2.6 mg/dL — ABNORMAL HIGH (ref 0.61–1.24)
Glucose, Bld: 199 mg/dL — ABNORMAL HIGH (ref 70–99)
HCT: 34 % — ABNORMAL LOW (ref 39.0–52.0)
Hemoglobin: 11.6 g/dL — ABNORMAL LOW (ref 13.0–17.0)
Potassium: 4.6 mmol/L (ref 3.5–5.1)
Sodium: 141 mmol/L (ref 135–145)
TCO2: 25 mmol/L (ref 22–32)

## 2024-03-04 LAB — ECHOCARDIOGRAM LIMITED
AR max vel: 2.49 cm2
AR max vel: 2.98 cm2
AV Area VTI: 2.57 cm2
AV Area VTI: 2.77 cm2
AV Area mean vel: 2.5 cm2
AV Area mean vel: 3.02 cm2
AV Mean grad: 3 mmHg
AV Mean grad: 2 mmHg
AV Peak grad: 4.2 mmHg
AV Peak grad: 4.2 mmHg
Ao pk vel: 1.02 m/s
Ao pk vel: 1.03 m/s
Height: 68 in
Weight: 2960 [oz_av]

## 2024-03-04 LAB — POCT ACTIVATED CLOTTING TIME: Activated Clotting Time: 274 s

## 2024-03-04 MED ORDER — SODIUM CHLORIDE 0.9% FLUSH: 3.0000 mL | INTRAVENOUS | Status: DC | PRN

## 2024-03-04 MED ORDER — MORPHINE SULFATE (PF) 2 MG/ML IV SOLN: 1.0000 mg | INTRAVENOUS | Status: DC | PRN

## 2024-03-04 MED ORDER — ACETAMINOPHEN 325 MG PO TABS: 650.0000 mg | ORAL_TABLET | Freq: Four times a day (QID) | ORAL | Status: DC | PRN

## 2024-03-04 MED ORDER — SODIUM CHLORIDE 0.9 % IV SOLN: INTRAVENOUS | Status: DC

## 2024-03-04 MED ORDER — CLOPIDOGREL BISULFATE 75 MG PO TABS
75.0000 mg | ORAL_TABLET | Freq: Every day | ORAL | Status: DC
  Administered 2024-03-04 – 2024-03-05 (×2): 75 mg via ORAL
  Filled 2024-03-04 (×2): qty 1

## 2024-03-04 MED ORDER — FERROUS SULFATE 325 (65 FE) MG PO TABS
325.0000 mg | ORAL_TABLET | Freq: Every day | ORAL | Status: DC
  Administered 2024-03-04 – 2024-03-05 (×2): 325 mg via ORAL
  Filled 2024-03-04 (×2): qty 1

## 2024-03-04 MED ORDER — ALBUMIN HUMAN 5 % IV SOLN
INTRAVENOUS | Status: AC
  Administered 2024-03-04: 12.5 g via INTRAVENOUS
  Filled 2024-03-04: qty 250

## 2024-03-04 MED ORDER — LACTATED RINGERS IV SOLN: INTRAVENOUS | Status: DC | PRN

## 2024-03-04 MED ORDER — ONDANSETRON HCL 4 MG/2ML IJ SOLN: 4.0000 mg | Freq: Four times a day (QID) | INTRAMUSCULAR | Status: DC | PRN

## 2024-03-04 MED ORDER — INSULIN ASPART 100 UNIT/ML IJ SOLN: 0.0000 [IU] | INTRAMUSCULAR | Status: DC | PRN

## 2024-03-04 MED ORDER — TRAMADOL HCL 50 MG PO TABS: 50.0000 mg | ORAL_TABLET | ORAL | Status: DC | PRN

## 2024-03-04 MED ORDER — ALLOPURINOL 100 MG PO TABS
100.0000 mg | ORAL_TABLET | Freq: Every day | ORAL | Status: DC
  Administered 2024-03-04 – 2024-03-05 (×2): 100 mg via ORAL
  Filled 2024-03-04 (×2): qty 1

## 2024-03-04 MED ORDER — ACETAMINOPHEN 650 MG RE SUPP: 650.0000 mg | Freq: Four times a day (QID) | RECTAL | Status: DC | PRN

## 2024-03-04 MED ORDER — DIAZEPAM 5 MG PO TABS
2.5000 mg | ORAL_TABLET | Freq: Once | ORAL | Status: AC
  Administered 2024-03-04: 2.5 mg via ORAL
  Filled 2024-03-04: qty 1

## 2024-03-04 MED ORDER — PROPOFOL 10 MG/ML IV BOLUS
INTRAVENOUS | Status: DC | PRN
  Administered 2024-03-04: 20 mg via INTRAVENOUS

## 2024-03-04 MED ORDER — LIDOCAINE HCL (PF) 1 % IJ SOLN
INTRAMUSCULAR | Status: AC
  Filled 2024-03-04: qty 30

## 2024-03-04 MED ORDER — GUAIFENESIN 200 MG PO TABS
400.0000 mg | ORAL_TABLET | Freq: Two times a day (BID) | ORAL | Status: DC
  Administered 2024-03-04 – 2024-03-05 (×2): 400 mg via ORAL
  Filled 2024-03-04 (×4): qty 2

## 2024-03-04 MED ORDER — CHLORHEXIDINE GLUCONATE 4 % EX SOLN: 30.0000 mL | CUTANEOUS | Status: DC

## 2024-03-04 MED ORDER — OXYCODONE HCL 5 MG PO TABS: 5.0000 mg | ORAL_TABLET | ORAL | Status: DC | PRN

## 2024-03-04 MED ORDER — SODIUM CHLORIDE 0.9 % IV SOLN: 250.0000 mL | INTRAVENOUS | Status: DC | PRN

## 2024-03-04 MED ORDER — IODIXANOL 320 MG/ML IV SOLN
INTRAVENOUS | Status: DC | PRN
  Administered 2024-03-04: 30 mL via INTRA_ARTERIAL

## 2024-03-04 MED ORDER — ALBUMIN HUMAN 5 % IV SOLN: 12.5000 g | Freq: Once | INTRAVENOUS | Status: AC

## 2024-03-04 MED ORDER — CHLORHEXIDINE GLUCONATE 0.12 % MT SOLN
15.0000 mL | Freq: Once | OROMUCOSAL | Status: AC
  Administered 2024-03-04: 15 mL via OROMUCOSAL
  Filled 2024-03-04: qty 15

## 2024-03-04 MED ORDER — LIDOCAINE HCL (PF) 1 % IJ SOLN
INTRAMUSCULAR | Status: DC | PRN
  Administered 2024-03-04: 5 mL
  Administered 2024-03-04: 2 mL

## 2024-03-04 MED ORDER — ASPIRIN 81 MG PO TBEC
81.0000 mg | DELAYED_RELEASE_TABLET | Freq: Every day | ORAL | Status: DC
  Administered 2024-03-04 – 2024-03-05 (×2): 81 mg via ORAL
  Filled 2024-03-04 (×2): qty 1

## 2024-03-04 MED ORDER — DAPAGLIFLOZIN PROPANEDIOL 10 MG PO TABS
10.0000 mg | ORAL_TABLET | Freq: Every day | ORAL | Status: DC
  Administered 2024-03-04 – 2024-03-05 (×2): 10 mg via ORAL
  Filled 2024-03-04 (×2): qty 1

## 2024-03-04 MED ORDER — SODIUM CHLORIDE 0.9 % IV BOLUS
500.0000 mL | Freq: Once | INTRAVENOUS | Status: AC
  Administered 2024-03-04: 500 mL via INTRAVENOUS

## 2024-03-04 MED ORDER — SODIUM CHLORIDE 0.9 % IV SOLN: INTRAVENOUS | Status: AC

## 2024-03-04 MED ORDER — DOCUSATE SODIUM 100 MG PO CAPS
100.0000 mg | ORAL_CAPSULE | Freq: Every evening | ORAL | Status: DC
  Administered 2024-03-04: 100 mg via ORAL
  Filled 2024-03-04: qty 1

## 2024-03-04 MED ORDER — PROTAMINE SULFATE 10 MG/ML IV SOLN
INTRAVENOUS | Status: DC | PRN
  Administered 2024-03-04: 130 mg via INTRAVENOUS

## 2024-03-04 MED ORDER — HEPARIN SODIUM (PORCINE) 1000 UNIT/ML IJ SOLN
INTRAMUSCULAR | Status: DC | PRN
  Administered 2024-03-04: 13000 [IU] via INTRAVENOUS

## 2024-03-04 MED ORDER — NITROGLYCERIN IN D5W 200-5 MCG/ML-% IV SOLN: 0.0000 ug/min | INTRAVENOUS | Status: DC

## 2024-03-04 MED ORDER — ONDANSETRON HCL 4 MG/2ML IJ SOLN
INTRAMUSCULAR | Status: DC | PRN
  Administered 2024-03-04: 4 mg via INTRAVENOUS

## 2024-03-04 MED ORDER — INSULIN ASPART 100 UNIT/ML IJ SOLN
0.0000 [IU] | Freq: Three times a day (TID) | INTRAMUSCULAR | Status: DC
  Administered 2024-03-04 – 2024-03-05 (×3): 2 [IU] via SUBCUTANEOUS
  Administered 2024-03-05: 8 [IU] via SUBCUTANEOUS
  Filled 2024-03-04 (×2): qty 4
  Filled 2024-03-04 (×3): qty 2

## 2024-03-04 MED ORDER — FENTANYL CITRATE (PF) 100 MCG/2ML IJ SOLN
INTRAMUSCULAR | Status: DC | PRN
  Administered 2024-03-04: 25 ug via INTRAVENOUS

## 2024-03-04 MED ORDER — FENTANYL CITRATE (PF) 100 MCG/2ML IJ SOLN
INTRAMUSCULAR | Status: AC
  Filled 2024-03-04: qty 2

## 2024-03-04 MED ORDER — GABAPENTIN 300 MG PO CAPS
600.0000 mg | ORAL_CAPSULE | Freq: Every day | ORAL | Status: DC
  Administered 2024-03-04: 600 mg via ORAL
  Filled 2024-03-04: qty 2

## 2024-03-04 MED ORDER — NOREPINEPHRINE 4 MG/250ML-% IV SOLN
0.0000 ug/min | INTRAVENOUS | Status: DC
  Administered 2024-03-04: 5 ug/min via INTRAVENOUS
  Filled 2024-03-04: qty 250

## 2024-03-04 MED ORDER — ORAL CARE MOUTH RINSE: 15.0000 mL | OROMUCOSAL | Status: DC | PRN

## 2024-03-04 MED ORDER — SODIUM CHLORIDE 0.9% FLUSH
3.0000 mL | Freq: Two times a day (BID) | INTRAVENOUS | Status: DC
  Administered 2024-03-04 – 2024-03-05 (×2): 3 mL via INTRAVENOUS

## 2024-03-04 MED ORDER — CEFAZOLIN SODIUM-DEXTROSE 2-4 GM/100ML-% IV SOLN
2.0000 g | Freq: Three times a day (TID) | INTRAVENOUS | Status: AC
  Administered 2024-03-04 – 2024-03-05 (×2): 2 g via INTRAVENOUS
  Filled 2024-03-04 (×2): qty 100

## 2024-03-04 MED ORDER — CHLORHEXIDINE GLUCONATE 4 % EX SOLN: 60.0000 mL | Freq: Once | CUTANEOUS | Status: DC

## 2024-03-04 MED ORDER — ATORVASTATIN CALCIUM 80 MG PO TABS
80.0000 mg | ORAL_TABLET | Freq: Every day | ORAL | Status: DC
  Administered 2024-03-04: 80 mg via ORAL
  Filled 2024-03-04: qty 1

## 2024-03-04 MED ORDER — PANTOPRAZOLE SODIUM 40 MG PO TBEC
40.0000 mg | DELAYED_RELEASE_TABLET | Freq: Every day | ORAL | Status: DC
  Administered 2024-03-04 – 2024-03-05 (×2): 40 mg via ORAL
  Filled 2024-03-04 (×2): qty 1

## 2024-03-04 MED ORDER — PRIMIDONE 50 MG PO TABS
50.0000 mg | ORAL_TABLET | Freq: Every day | ORAL | Status: DC
  Administered 2024-03-04: 50 mg via ORAL
  Filled 2024-03-04 (×3): qty 1

## 2024-03-04 NOTE — Interval H&P Note (Signed)
 History and Physical Interval Note:  03/04/2024 9:05 AM  Marco Acevedo  has presented today for surgery, with the diagnosis of Severe Aortic Stenosis.  The various methods of treatment have been discussed with the patient and family. After consideration of risks, benefits and other options for treatment, the patient has consented to  Procedure(s): Transcatheter Aortic Valve Replacement, Transfemoral (Right) ECHOCARDIOGRAM, TRANSTHORACIC (N/A) as a surgical intervention.  The patient's history has been reviewed, patient examined, no change in status, stable for surgery.  I have reviewed the patient's chart and labs.  Questions were answered to the patient's satisfaction.     Tima Curet MALVA Rayas

## 2024-03-04 NOTE — Progress Notes (Signed)
 BP Trending down. HR 50s sinus rhythm. Dr. Erma called. Verbal orders to start levo in order set and give 250ml of albumin .

## 2024-03-04 NOTE — Progress Notes (Signed)
 1740: RN at bedside when pt. states new onset of right arm and hand weakness/numbness. Pt. son at bedside to confirm this new finding. Pt. states he has trouble grasping his utensil with his right hand. This RN immediately performed an NIH assessment which was 0 and called Claudene, MD to bedside. Slight right hand weakness noted but grip strengths equal on both sides. Pt. States this could be d/t trigger finger.  1750: Code stroke called with the LKWT presumed 1740.  ~1800: Stroke team arrived at bedside to eval. Decided to defer CT and get MRI to r/o infarct.

## 2024-03-04 NOTE — Anesthesia Preprocedure Evaluation (Signed)
 Anesthesia Evaluation  Patient identified by MRN, date of birth, ID band Patient awake    Reviewed: Allergy & Precautions, H&P , NPO status , Patient's Chart, lab work & pertinent test results  History of Anesthesia Complications Negative for: history of anesthetic complications  Airway Mallampati: II  TM Distance: >3 FB Neck ROM: Full    Dental no notable dental hx.    Pulmonary sleep apnea , COPD, former smoker   Pulmonary exam normal breath sounds clear to auscultation       Cardiovascular hypertension, (-) angina + CAD and +CHF  Normal cardiovascular exam Rhythm:Regular Rate:Normal  AS  12/2023 IMPRESSIONS     1. Left ventricular ejection fraction, by estimation, is 65 to 70%. Left  ventricular ejection fraction by 3D volume is 65 %. The left ventricle has  normal function. The left ventricle has no regional wall motion  abnormalities. There is mild concentric  left ventricular hypertrophy. Left ventricular diastolic parameters are  consistent with Grade I diastolic dysfunction (impaired relaxation).   2. Right ventricular systolic function is normal. The right ventricular  size is normal.   3. Left atrial size was moderately dilated.   4. The mitral valve is degenerative. Trivial mitral valve regurgitation.  No evidence of mitral stenosis. Moderate mitral annular calcification.   5. The aortic valve is tricuspid. There is severe calcifcation of the  aortic valve. Aortic valve regurgitation is not visualized. Moderate to  severe aortic valve stenosis. Aortic valve area, by VTI measures 1.01 cm.  Aortic valve mean gradient measures  36.5 mmHg. Aortic valve Vmax measures 3.98 m/s.   6. The inferior vena cava is normal in size with greater than 50%  respiratory variability, suggesting right atrial pressure of 3 mmHg.     Neuro/Psych  PSYCHIATRIC DISORDERS Anxiety     CVA    GI/Hepatic Neg liver ROS, hiatal  hernia,GERD  ,,  Endo/Other  diabetes, Type 2    Renal/GU negative Renal ROS  negative genitourinary   Musculoskeletal  (+) Arthritis ,    Abdominal   Peds negative pediatric ROS (+)  Hematology negative hematology ROS (+)   Anesthesia Other Findings   Reproductive/Obstetrics negative OB ROS                              Anesthesia Physical Anesthesia Plan  ASA: 4  Anesthesia Plan: MAC   Post-op Pain Management:    Induction: Intravenous  PONV Risk Score and Plan: 1 and Propofol  infusion and Treatment may vary due to age or medical condition  Airway Management Planned: Natural Airway  Additional Equipment:   Intra-op Plan:   Post-operative Plan:   Informed Consent: I have reviewed the patients History and Physical, chart, labs and discussed the procedure including the risks, benefits and alternatives for the proposed anesthesia with the patient or authorized representative who has indicated his/her understanding and acceptance.     Dental advisory given  Plan Discussed with: CRNA  Anesthesia Plan Comments:          Anesthesia Quick Evaluation

## 2024-03-04 NOTE — Op Note (Signed)
 Signed       [facility]   MULTIDISCIPLINARY HEART VALVE TEAM     TAVR OPERATIVE NOTE     Date of Procedure:                03/04/2024   Preoperative Diagnosis:      Severe Aortic Stenosis    Postoperative Diagnosis:    Same    Procedure:        Transcatheter Aortic Valve Replacement - Percutaneous Transfemoral Approach             3 ULTRA RESILIA 26 - [id number] (nominal prep)              Co-Surgeons:                        Linnie Rayas, MD and Lonni Cash, MD   Anesthesiologist:                  General   Echocardiographer:              Toney Decent, MD   Pre-operative Echo Findings: Severe aortic stenosis normal left ventricular systolic function    Post-operative Echo Findings: No paravalvular leak normal left ventricular systolic function      BRIEF CLINICAL NOTE AND INDICATIONS FOR SURGERY   89 y.o. male with severe aortic stenosis.  STS score: 7.87.  NYHA Class II.  The risks and benefits of transfemoral TAVR were discussed in detail.  We also discussed possibility of an emergent sternotomy to address any procedural complications.  Based on our discussion, we collectively decided that an emergent sternotomy would not be indicated.  The patient is  agreeable to proceed.  Based on my review of her LHC, echo, and CTA, I agree with the multidisciplinary plan to proceed with a 26mm S3 transfemoral TAVR.         DETAILS OF THE OPERATIVE PROCEDURE   PREPARATION:     The patient was brought to the operating room on the above mentioned date and appropriate monitoring was established by the anesthesia team. The patient was placed in the supine position on the operating table.  Intravenous antibiotics were administered. The patient was monitored closely throughout the procedure under conscious sedation.   Baseline transthoracic echocardiogram was performed. The patient's abdomen and both groins were prepped and draped in a sterile manner. A  time out procedure was performed.     PERIPHERAL ACCESS:     Using the modified Seldinger technique, femoral arterial was obtained with placement of 6 Fr sheath on the left side.  A pigtail diagnostic catheter was passed through the arterial sheath under fluoroscopic guidance into the aortic root. Venous access was from the femoralvein.  A temporary transvenous pacemaker catheter was passed through the venous sheath under fluoroscopic guidance into the right ventricle.  The pacemaker was tested to ensure stable lead placement and pacemaker capture. Aortic root angiography was performed in order to determine the optimal angiographic angle for valve deployment.     TRANSFEMORAL ACCESS:    Percutaneous transfemoral access and sheath placement was performed using ultrasound guidance.  The right common femoral artery was cannulated using a micropuncture needle and appropriate location was verified using hand injection angiogram.  A pair of Abbott Perclose percutaneous closure devices were placed and a 6 French sheath replaced into the femoral artery.  The patient was heparinized systemically and ACT verified > 250 seconds.     A  14 Fr transfemoral E-sheath was introduced into the right common femoral artery after progressively dilating over an Amplatz superstiff wire. An pigtail catheter was used to direct a straight-tip exchange length wire across the native aortic valve into the left ventricle. This was exchanged out for a pigtail catheter and position was confirmed in the LV apex. Simultaneous LV and Ao pressures were recorded.  The pigtail catheter was exchanged for a Safari wire in the LV apex.         TRANSCATHETER HEART VALVE DEPLOYMENT:    An Edwards Sapien transcatheter heart valve (size 26 mm) was prepared and crimped per manufacturer's guidelines, and the proper orientation of the valve is confirmed on the [employer] delivery system. The valve was advanced through the introducer  sheath using normal technique until in an appropriate position in the abdominal aorta beyond the sheath tip. The balloon was then retracted and using the fine-tuning wheel was centered on the valve. The valve was then advanced across the aortic arch using appropriate flexion of the catheter. The valve was carefully positioned across the aortic valve annulus. The Commander catheter was retracted using normal technique. Once final position of the valve has been confirmed by angiographic assessment, the valve is deployed during rapid ventricular pacing to maintain systolic blood pressure < 50 mmHg and pulse pressure < 10 mmHg. The balloon inflation is held for >3 seconds after reaching full deployment volume. Once the balloon has fully deflated the balloon is retracted into the ascending aorta and valve function is assessed using echocardiography. There is felt to be no paravalvular leak and no central aortic insufficiency.  The patient's hemodynamic recovery following valve deployment is good.  The deployment balloon and guidewire are both removed.      PROCEDURE COMPLETION:    The sheath was removed and femoral artery closure performed.  Protamine  was administered once femoral arterial repair was complete. The temporary pacemaker, pigtail catheter and femoral sheaths were removed with manual pressure used for venous hemostasis.  A Mynx femoral closure device was utilized following removal of the diagnostic sheath in the  femoral artery.   The patient tolerated the procedure well and is transported to the cath lab recovery area in stable condition. There were no immediate intraoperative complications. All sponge instrument and needle counts are verified correct at completion of the operation.    No blood products were administered during the operation.   The patient received a total of 35 mL of intravenous contrast during the procedure.

## 2024-03-04 NOTE — Consult Note (Signed)
 NEUROLOGY CONSULT NOTE   Date of service: March 04, 2024 Patient Name: Marco Acevedo MRN:  [id number] DOB:  [birth year] Chief Complaint: Right arm weakness Requesting Provider: Verlin Lonni BIRCH,*  History of Present Illness  Marco Acevedo is a 89 y.o. male with hx of aortic stenosis congestive heart failure heart disease and old stroke with mild right eye deficits status post TAVR earlier today 12:44.  Status post TAVR he had a systolic blood pressure in the 70s requiring Levophed .  At 1540 had right hand weakness and numbness.  His hand also felt clumsy.  Code stroke was activated at 1750.  At bedside 1800. He was completely at baseline.  No focal deficits.  Numbness is resolved.  LKW: Prior to TAVR 9 AM.   NIHSS components Score: Comment  1a Level of Conscious 0[x]  1[]  2[]  3[]      1b LOC Questions 0[x]  1[]  2[]       1c LOC Commands 0[x]  1[]  2[]       2 Best Gaze 0[x]  1[]  2[]       3 Visual 0[x]  1[]  2[]  3[]      4 Facial Palsy 0[x]  1[]  2[]  3[]      5a Motor Arm - left 0[x]  1[]  2[]  3[]  4[]  UN[]    5b Motor Arm - Right 0[x]  1[]  2[]  3[]  4[]  UN[]    6a Motor Leg - Left 0[x]  1[]  2[]  3[]  4[]  UN[]    6b Motor Leg - Right 0[x]  1[]  2[]  3[]  4[]  UN[]    7 Limb Ataxia 0[x]  1[]  2[]  UN[]      8 Sensory 0[x]  1[]  2[]  UN[]      9 Best Language 0[x]  1[]  2[]  3[]      10 Dysarthria 0[x]  1[]  2[]  UN[]      11 Extinct. and Inattention 0[x]  1[]  2[]       TOTAL:     NHR scale 0 ROS  Comprehensive ROS performed and pertinent positives documented in HPI    Past History   Past Medical History:  Diagnosis Date   Anemia    low iron    Aortic stenosis    Arthritis    BPH (benign prostatic hyperplasia)    CAD (coronary artery disease)    Chronic diastolic CHF (congestive heart failure) (HCC)    CKD (chronic kidney disease), stage IV (HCC)    Claustrophobia    Complication of anesthesia    has to have head elevated to keep from strangling on post nasal drip   COPD (chronic obstructive pulmonary  disease) (HCC)    Diabetes mellitus type II    type 2   GERD (gastroesophageal reflux disease)    Gout    Granuloma annulare    History of hiatal hernia    History of kidney stones    Litthotrispy   Hyperlipidemia    Hypertension    Neuropathy    Obesity    OSA (obstructive sleep apnea)    Stroke (HCC) 10/2017   Weakness right arm and leg    Past Surgical History:  Procedure Laterality Date   BLEPHAROPLASTY     CARDIAC CATHETERIZATION  [id number]   X 2 stents   CHOLECYSTECTOMY N/A 09/23/2014   Procedure: LAPAROSCOPIC CHOLECYSTECTOMY WITH INTRAOPERATIVE CHOLANGIOGRAM;  Surgeon: Deward Null [name], MD;  Location: [facility] OR;  Service: General;  Laterality: N/A;   COLONOSCOPY     COLONOSCOPY WITH PROPOFOL  N/A 03/22/2016   Procedure: COLONOSCOPY WITH PROPOFOL ;  Surgeon: Jerrell Sol, MD;  Location: [facility] ENDOSCOPY;  Service: Endoscopy;  Laterality: N/A;   ENDARTERECTOMY Left 10/19/2017  Procedure: ENDARTERECTOMY CAROTID LEFT;  Surgeon: Oris Krystal FALCON, MD;  Location: [facility] OR;  Service: Vascular;  Laterality: Left;   ESOPHAGOGASTRODUODENOSCOPY (EGD) WITH PROPOFOL  N/A 03/22/2016   Procedure: ESOPHAGOGASTRODUODENOSCOPY (EGD) WITH PROPOFOL ;  Surgeon: Jerrell Sol, MD;  Location: [facility] ENDOSCOPY;  Service: Endoscopy;  Laterality: N/A;   EYE SURGERY     both cataracts   INTRAMEDULLARY (IM) NAIL INTERTROCHANTERIC Left 08/11/2022   Procedure: INTRAMEDULLARY (IM) NAIL INTERTROCHANTERIC;  Surgeon: Edna Toribio LABOR, MD;  Location: [facility] OR;  Service: Orthopedics;  Laterality: Left;   KNEE ARTHROSCOPY WITH MEDIAL MENISECTOMY Right 08/19/2013   Procedure: RIGHT KNEE ARTHROSCOPY WITH PARTIAL MEDIAL MENISECTOMY AND CHONDROPLASTY;  Surgeon: Maude KANDICE Herald, MD;  Location: [facility];  Service: Orthopedics;  Laterality: Right;   LEFT HEART CATH AND CORONARY ANGIOGRAPHY N/A 06/28/2016   Procedure: Left Heart Cath and Coronary Angiography;  Surgeon: Victory LELON Sharps, MD;  Location: [facility] INVASIVE CV LAB;   Service: Cardiovascular;  Laterality: N/A;   LEFT HEART CATH AND CORONARY ANGIOGRAPHY N/A 02/25/2019   Procedure: LEFT HEART CATH AND CORONARY ANGIOGRAPHY;  Surgeon: Sharps Victory LELON, MD;  Location: [facility] INVASIVE CV LAB;  Service: Cardiovascular;  Laterality: N/A;   LEFT HEART CATHETERIZATION WITH CORONARY ANGIOGRAM N/A 10/03/2011   Procedure: LEFT HEART CATHETERIZATION WITH CORONARY ANGIOGRAM;  Surgeon: Victory LELON Sharps DOUGLAS, MD;  Location: [facility] CATH LAB;  Service: Cardiovascular;  Laterality: N/A;   LEFT HEART CATHETERIZATION WITH CORONARY ANGIOGRAM N/A 09/12/2012   Procedure: LEFT HEART CATHETERIZATION WITH CORONARY ANGIOGRAM;  Surgeon: Victory LELON Sharps DOUGLAS, MD;  Location: [facility] CATH LAB;  Service: Cardiovascular;  Laterality: N/A;   LIPOMA EXCISION     Biospy only; left parotid gland   LITHOTRIPSY     none     PERCUTANEOUS CORONARY STENT INTERVENTION (PCI-S) N/A 09/17/2012   Procedure: PERCUTANEOUS CORONARY STENT INTERVENTION (PCI-S);  Surgeon: Victory LELON Sharps DOUGLAS, MD;  Location: [facility] CATH LAB;  Service: Cardiovascular;  Laterality: N/A;   RIGHT/LEFT HEART CATH AND CORONARY ANGIOGRAPHY N/A 01/07/2024   Procedure: RIGHT/LEFT HEART CATH AND CORONARY ANGIOGRAPHY;  Surgeon: Wonda Sharper, MD;  Location: [facility] INVASIVE CV LAB;  Service: Cardiovascular;  Laterality: N/A;   TRIGGER FINGER RELEASE Left 12/21/2015   Procedure: LEFT LONG FINGER TRIGGER RELEASE ;  Surgeon: Alm Hummer, MD;  Location: [facility];  Service: Orthopedics;  Laterality: Left;    Family History: Family History  Problem Relation Age of Onset   Aortic aneurysm Father    Aneurysm Mother        brain   Cancer Brother    COPD Sister    Other Sister        health hx unknown   Other Sister        health hx unknown   Other Brother        health hx unknown    Social History  reports that he quit smoking about 12 years ago. His smoking use included cigarettes. He started smoking about 76 years ago. He has a 64 pack-year smoking  history. He has never used smokeless tobacco. He reports current alcohol use. He reports that he does not use drugs.  Allergies  Allergen Reactions   Cimetidine Other (See Comments)    Anxiety Attacks   Metformin  Hcl     Other reaction(s): decreased kidney function    Medications   Current Facility-Administered Medications:    0.9 %  sodium chloride  infusion, , Intravenous, Continuous, Hummer Lamarr SAUNDERS, PA-C, Last Rate: 50  mL/hr at 03/04/24 1600, Infusion Verify at 03/04/24 1600   0.9 %  sodium chloride  infusion, 250 mL, Intravenous, PRN, Thompson, Kathryn R, PA-C   acetaminophen  (TYLENOL ) tablet 650 mg, 650 mg, Oral, Q6H PRN **OR** acetaminophen  (TYLENOL ) suppository 650 mg, 650 mg, Rectal, Q6H PRN, Sebastian Lamarr SAUNDERS, PA-C   allopurinol  (ZYLOPRIM ) tablet 100 mg, 100 mg, Oral, Daily, Thompson, Kathryn R, PA-C, 100 mg at 03/04/24 1753   aspirin  EC tablet 81 mg, 81 mg, Oral, Daily, Thompson, Kathryn R, PA-C, 81 mg at 03/04/24 1753   atorvastatin  (LIPITOR ) tablet 80 mg, 80 mg, Oral, q1800, Sebastian Lamarr SAUNDERS, PA-C, 80 mg at 03/04/24 1753   ceFAZolin  (ANCEF ) IVPB 2g/100 mL premix, 2 g, Intravenous, Q8H, Sebastian Lamarr SAUNDERS, PA-C   clopidogrel  (PLAVIX ) tablet 75 mg, 75 mg, Oral, Daily, Thompson, Kathryn R, PA-C, 75 mg at 03/04/24 1752   dapagliflozin  propanediol (FARXIGA ) tablet 10 mg, 10 mg, Oral, Daily, Thompson, Kathryn R, PA-C, 10 mg at 03/04/24 1753   diazepam  (VALIUM ) tablet 2.5 mg, 2.5 mg, Oral, Once, Claudene Toribio BROCKS, MD   docusate sodium  (COLACE) capsule 100 mg, 100 mg, Oral, QPM, Thompson, Kathryn R, PA-C, 100 mg at 03/04/24 1753   ferrous sulfate  tablet 325 mg, 325 mg, Oral, Daily, Thompson, Kathryn R, PA-C, 325 mg at 03/04/24 1753   gabapentin  (NEURONTIN ) capsule 600 mg, 600 mg, Oral, QHS, Thompson, Kathryn R, PA-C   guaiFENesin  tablet 400 mg, 400 mg, Oral, BID, Sebastian Lamarr SAUNDERS, PA-C   CBG monitoring, , , 4x Daily, AC & HS **AND** insulin  aspart (novoLOG ) injection 0-24  Units, 0-24 Units, Subcutaneous, TID AC & HS, Thompson, Kathryn R, PA-C, 2 Units at 03/04/24 1800   morphine  (PF) 2 MG/ML injection 1-4 mg, 1-4 mg, Intravenous, Q1H PRN, Sebastian Lamarr SAUNDERS, PA-C   nitroGLYCERIN  50 mg in dextrose  5 % 250 mL (0.2 mg/mL) infusion, 0-100 mcg/min, Intravenous, Titrated, Sebastian Lamarr SAUNDERS, PA-C   norepinephrine  (LEVOPHED ) 4mg  in (0.016 mg/mL) premix infusion, 0-10 mcg/min, Intravenous, Titrated, Sebastian Lamarr SAUNDERS, PA-C, Last Rate: 3.75 mL/hr at 03/04/24 1600, 1 mcg/min at 03/04/24 1600   ondansetron  (ZOFRAN ) injection 4 mg, 4 mg, Intravenous, Q6H PRN, Sebastian Lamarr SAUNDERS, PA-C   oxyCODONE  (Oxy IR/ROXICODONE ) immediate release tablet 5-10 mg, 5-10 mg, Oral, Q3H PRN, Sebastian Lamarr SAUNDERS, PA-C   pantoprazole  (PROTONIX ) EC tablet 40 mg, 40 mg, Oral, Daily, Thompson, Kathryn R, PA-C, 40 mg at 03/04/24 1753   primidone  (MYSOLINE ) tablet 50 mg, 50 mg, Oral, QHS, Sebastian Lamarr SAUNDERS, PA-C   sodium chloride  flush (NS) 0.9 % injection 3 mL, 3 mL, Intravenous, Q12H, Sebastian Lamarr SAUNDERS, PA-C   sodium chloride  flush (NS) 0.9 % injection 3 mL, 3 mL, Intravenous, PRN, Sebastian Lamarr SAUNDERS, PA-C   traMADol  (ULTRAM ) tablet 50-100 mg, 50-100 mg, Oral, Q4H PRN, Sebastian Lamarr SAUNDERS, PA-C  Vitals   Vitals:   03/04/24 1615 03/04/24 1625 03/04/24 1630 03/04/24 1800  BP: (!) 140/64 (!) 120/58 (!) 112/56   Pulse: 60 (!) 58 (!) 58   Resp: 16 16 17    Temp:    97.6 F (36.4 C)  TempSrc:    Oral  SpO2: 98% 97% 98%   Weight:      Height:        Body mass index is 28.13 kg/m.   Physical Exam   Constitutional: Appears well-developed and well-nourished.  Psych: Affect appropriate to situation.  Eyes: No scleral injection.  HENT: No OP obstruction.  Head: Normocephalic.  Cardiovascular: Normal rate and  regular rhythm.  Respiratory: Effort normal, non-labored breathing.  GI: Soft.  No distension. There is no tenderness.  Skin: WDI.   Neurologic Examination   See  above. Labs/Imaging/Neurodiagnostic studies   CBC:  Recent Labs  Lab 03/17/2024 0950 03/04/24 1258 03/04/24 1641  WBC 5.0  --   --   HGB 13.2 11.6* 11.3*  HCT 40.1 34.0* 35.1*  MCV 90.3  --   --   PLT 152  --   --    Basic Metabolic Panel:  Lab Results  Component Value Date   NA 141 03/04/2024   K 4.6 03/04/2024   CO2 26 March 17, 2024   GLUCOSE 199 (H) 03/04/2024   BUN 40 (H) 03/04/2024   CREATININE 2.60 (H) 03/04/2024   CALCIUM  8.9 Mar 17, 2024   GFRNONAA 21 (L) 03-17-2024   GFRAA 27 (L) 01/02/2020   Lipid Panel:  Lab Results  Component Value Date   LDLCALC 40 12/28/2023   HgbA1c:  Lab Results  Component Value Date   HGBA1C 6.5 (H) 08/10/2022   Urine Drug Screen:     Component Value Date/Time   LABOPIA NONE DETECTED 10/13/2017 1711   COCAINSCRNUR NONE DETECTED 10/13/2017 1711   LABBENZ NONE DETECTED 10/13/2017 1711   AMPHETMU NONE DETECTED 10/13/2017 1711   THCU NONE DETECTED 10/13/2017 1711   LABBARB (A) 10/13/2017 1711    Result not available. Reagent lot number recalled by manufacturer.    Alcohol Level     Component Value Date/Time   ETH <10 10/13/2017 1528   INR  Lab Results  Component Value Date   INR 1.0 03-17-24   APTT  Lab Results  Component Value Date   APTT 31 10/17/2017   AED levels: No results found for: PHENYTOIN, ZONISAMIDE, LAMOTRIGINE, LEVETIRACETA  Previous MRI: IMPRESSION: 1. No new acute stroke or hemorrhage. 2. Stable early subacute infarcts in the left MCA distribution in comparison with prior MRI of the brain. 3. Stable moderate chronic microvascular ischemic changes, parenchymal volume loss, and small cortical infarcts of the brain.     Electronically Signed   By: Gustavo Mary M.D.   On: 10/17/2017 20:29  ASSESSMENT   Marco Acevedo is a 89 y.o. male with history of CHF heart disease history of left MCA stroke with mild right-sided deficit status post TAVR procedure this morning.  Last known  normal was prior to the procedure.  He had a drop in his blood pressure with systolics in the 70s and had right arm clumsiness and numbness that resolved.  No deficits on my exam.  Likely recrudescence of an old stroke with symptoms on the right side.  Discussed with ICU he is back to baseline.  He could have recrudescence of his symptoms from drop in his blood pressure.  Advised to keep systolic blood pressure greater than 100.  He is not a candidate for TNK given his recent TAVR procedure and his NIH stroke scale is 0.  No indication for LVO.  Low suspicion for bleed again as his NIH stroke scale is 0.  He is not on any anticoagulation.  Decided to cancel code stroke and pursue  MRI. RECOMMENDATIONS   Recommend systolic blood pressure greater than 100 Brain MRI further recommendations after this is completed. Aspirin  and Plavix . Frequent neurochecks and reactivate code stroke with worsening weakness at that point would pursue CTA for large vessel thrombus.   MDM: High. Pertinent labs, imaging results reviewed by me and considered in my decision making. Independently reviewed imaging. Medical  records reviewed. Discussed the patient with another medical provider/personnel. Obtained history from someone other than the patient.      Hendrik Donath,MD  ______________________________________________________________________    Signed, Zeke CHRISTELLA Raddle, MD [facility]   ADDENDUM:  IMPRESSION: 1. Small acute left MCA distribution infarct involving the posterior left frontoparietal region. Minimal associated petechial blood products without hemorrhagic transformation or significant regional mass effect. 2. Few additional subcentimeter acute ischemic nonhemorrhagic infarcts involving the inferior left cerebellum. 3. Underlying age-related cerebral atrophy with moderate chronic microvascular ischemic disease, with a few scattered chronic lacunar infarcts involving the left basal  ganglia and left cerebellum.     Electronically Signed   By: Morene Hoard M.D.   On: 03/05/2024 03:14    Stroke team to follow.

## 2024-03-04 NOTE — Discharge Summary (Incomplete)
 HEART AND VASCULAR CENTER   MULTIDISCIPLINARY HEART VALVE TEAM  Discharge Summary    Patient ID: Marco Acevedo MRN: [id number]; DOB: 30-Dec-1934  Admit date: 03/04/2024 Discharge date: 03/05/2024  PCP:  Okey Carlin Redbird, MD  [facility] Cardiologist:  Oneil Parchment, MD  [facility] Structural heart: Lonni Cash, MD [facility] Electrophysiologist:  None   Discharge Diagnoses    Principal Problem:   S/P TAVR (transcatheter aortic valve replacement) Active Problems:   COPD (chronic obstructive pulmonary disease) (HCC)   OSA (obstructive sleep apnea)   CKD (chronic kidney disease) stage 4, GFR 15-29 ml/min (HCC)   Essential hypertension   Anemia   Benign prostatic hyperplasia without lower urinary tract symptoms   Carotid artery disease   Carpal tunnel syndrome   Diabetic peripheral neuropathy associated with type 2 diabetes mellitus (HCC)   DMII (diabetes mellitus, type 2) (HCC)   CAD S/P percutaneous coronary angioplasty   Severe aortic stenosis   Allergies Allergies  Allergen Reactions   Cimetidine Other (See Comments)    Anxiety Attacks   Metformin  Hcl     Other reaction(s): decreased kidney function    Diagnostic Studies/Procedures    HEART AND VASCULAR CENTER  TAVR OPERATIVE NOTE     Date of Procedure:                03/04/2024   Preoperative Diagnosis:      Severe Aortic Stenosis    Postoperative Diagnosis:    Same    Procedure:        Transcatheter Aortic Valve Replacement - Transfemoral Approach             Edwards Sapien 3 Ultra Resilia THV (size 26 mm, model # [id number], serial # [id number] )              Co-Surgeons:                        Lonni Cash, MD and Linnie Rayas , MD    Anesthesiologist:                  Marco Acevedo   Echocardiographer:              O'Neal   Pre-operative Echo Findings: Severe aortic stenosis Normal left ventricular systolic function   Post-operative Echo Findings: No paravalvular  leak Normal left ventricular systolic function _____________    Echo 03/05/24: completed but pending formal read at the time of discharge   History of Present Illness     Marco Acevedo is a 89 y.o. male with a history of anemia, DMT2, COPD, PAD on imaging, chronic diastolic CHF, CAD, obesity, CKD stage IV, HTN, HLD, previous stroke, sleep apnea and severe aortic stenosis who [facility] to [facility]24 for planned TAVR.   He is known to have moderate CAD and moderate aortic stenosis. He had recent chest pain while hiking at high altitudes. Echo 12/28/23 with LVEF 65-70%. Normal RV function. Moderately severe aortic stenosis with mean gradient 36.5 mmHg, AVA 1.0 cm2, SVI 51, DI 0.21. Cardiac cath 01/07/24 with patent Circumflex stents with non-obstructive disease noted in the LAD, Circumflex and RCA. Severe aortic stenosis with mean gradient 37 mmHg, AVA 0.78 cm2.   The patient was evaluated by the multidisciplinary valve team and felt to have severe, symptomatic aortic stenosis and to be a suitable candidate for TAVR, which was set up for 03/04/24.   Hospital Course     Consultants:  none   Severe AS:  -- S/p successful TAVR with a 26 mm Edwards Sapien 3 Ultra Resilia THV via the TF approach on 03/04/24.  -- Post operative echo completed but pending formal read. -- Groin sites are stable.  -- Continue home Asprin 81mg  daily and Plavix  75mg  daily.  -- Met with cardiac rehab to discuss CRP phase II.  -- Plan for discharge home today with close follow up in the outpatient setting.   Hypotension: -- Developed profound hypotension in the setting of bradycardia. This improved with volume resuscitation and Levophed . -- Limited echo showed small new circumferential pericardial effusion w/o tamponade.  -- This has now resolved.   Acute CVA:  -- Pt had right arm heaviness and MRI showed small acute L MCA infarct.  -- Appreciate neuro assistance.    New LBBB: -- Pt had new LBBB after TAVR  with bradycardia associated with hypotension.  -- ECG today with NSR and narrow QRS. No further bradycardia on tele.   CAD: -- Cardiac cath 01/07/24 with patent Circumflex stents with non-obstructive disease noted in the LAD, Circumflex and RCA. -- Continue medical therapy.   Chronic HFpEF: -- LVEDP 15 mm hg at the time of TAVR.  -- Continue Jardiance 10mg  daily.   HTN: -- BP elevated now.  -- Resume home meds including amlodipine  2.5mg  daily, carvedilol  6.25 mg BID and Imdur  120 mg daily.   CKD stage IV: -- Creat stable at 2.64.  -- Followed by Dr. Norine.   Aortic dissection: -- Pre TAVR CTs showed a linear filling defect in the infrarenal abdominal aorta which may represent a web-like plaque or sequelae of prior focal dissection.  -- Reviewed by team and felt to have RTF access.   Lung infiltrate: -- Noted on pre TAVR scans. Pt treated with doxycyline given ongoing cough.  _____________  Discharge Vitals Blood pressure 134/84, pulse 88, temperature (!) 96.7 F (35.9 C), temperature source Axillary, resp. rate 14, height 5' 8 (1.727 m), weight 88.5 kg, SpO2 99%.  Filed Weights   03/03/24 1200 03/04/24 0905 03/05/24 0410  Weight: 85.3 kg 83.9 kg 88.5 kg     GEN: Well nourished, well developed in no acute distress NECK: No JVD CARDIAC: RRR, no murmurs, rubs, gallops RESPIRATORY:  Clear to auscultation without rales, wheezing or rhonchi  ABDOMEN: Soft, non-tender, non-distended EXTREMITIES:  No edema; No deformity.  Groin sites clear without hematoma or ecchymosis.    Disposition   Pt is being discharged home today in good condition.  Follow-up Plans & Appointments     Follow-up Information     Verlin Lonni BIRCH, MD. Go on 03/13/2024.   Specialty: Cardiology Why: @ 11:20am, please arrive at least 20 minutes early. Contact information: [address] [place] [id number] [phone]                  Discharge Medications   Allergies  as of 03/05/2024       Reactions   Cimetidine Other (See Comments)   Anxiety Attacks   Metformin  Hcl    Other reaction(s): decreased kidney function        Medication List     STOP taking these medications    nitroGLYCERIN  0.4 MG SL tablet Commonly known as: NITROSTAT        TAKE these medications    acetaminophen  325 MG tablet Commonly known as: TYLENOL  Take 2 tablets (650 mg total) by mouth every 6 (six) hours as needed for mild pain (pain score 1-3) (  or Fever >/= 101).   albuterol  (2.5 MG/3ML) 0.083% nebulizer solution Commonly known as: PROVENTIL  Take 2.5 mg by nebulization every 6 (six) hours as needed for wheezing or shortness of breath.   albuterol  108 (90 Base) MCG/ACT inhaler Commonly known as: VENTOLIN  HFA Inhale 2 puffs into the lungs every 6 (six) hours as needed for wheezing or shortness of breath.   allopurinol  100 MG tablet Commonly known as: ZYLOPRIM  Take 100 mg by mouth daily.   amLODipine  2.5 MG tablet Commonly known as: NORVASC  Take 2.5 mg by mouth daily.   aspirin  EC 81 MG tablet Take 1 tablet (81 mg total) by mouth daily. Swallow whole. Start taking on: March 06, 2024 What changed: additional instructions   atorvastatin  80 MG tablet Commonly known as: LIPITOR  Take 1 tablet (80 mg total) by mouth daily at 6 PM.   Capsaicin 0.033 % Crea Apply 1 Application topically 2 (two) times daily as needed (pain).   carvedilol  6.25 MG tablet Commonly known as: COREG  Take 6.25 mg by mouth 2 (two) times daily with a meal.   cholecalciferol 25 MCG (1000 UNIT) tablet Commonly known as: VITAMIN D3 Take 1,000 Units by mouth daily.   clopidogrel  75 MG tablet Commonly known as: PLAVIX  Take 1 tablet (75 mg total) by mouth daily.   cyanocobalamin  1000 MCG tablet Commonly known as: VITAMIN B12 Take 1,000 mcg by mouth daily.   DSS 100 MG Caps Take 1 capsule by mouth every evening.   Farxiga  10 MG Tabs tablet Generic drug: dapagliflozin   propanediol Take 10 mg by mouth daily.   finasteride  5 MG tablet Commonly known as: PROSCAR  Take 1 tablet (5 mg total) by mouth daily.   gabapentin  300 MG capsule Commonly known as: NEURONTIN  Take 2 capsules (600 mg total) by mouth at bedtime.   guaifenesin  400 MG Tabs tablet Commonly known as: HUMIBID E Take 400 mg by mouth 2 (two) times daily.   Iron  325 (65 Fe) MG Tabs Take 325 mg by mouth daily.   isosorbide  mononitrate 120 MG 24 hr tablet Commonly known as: IMDUR  Take 1 tablet (120 mg total) by mouth daily.   pantoprazole  40 MG tablet Commonly known as: PROTONIX  Take 40 mg by mouth daily.   primidone  50 MG tablet Commonly known as: MYSOLINE  Take 50 mg by mouth at bedtime.   sucralfate  1 GM/10ML suspension Commonly known as: CARAFATE  Take 10 mLs by mouth 2 (two) times daily as needed (heartburn).   traMADol  50 MG tablet Commonly known as: ULTRAM  Take 1 tablet (50 mg total) by mouth at bedtime as needed for moderate pain. What changed:  when to take this additional instructions            Outstanding Labs/Studies   none  ______________________  Duration of Discharge Encounter: APP Time: 35 minutes    Signed, Kyla CHRISTELLA Donald, PA-C 03/05/2024, 11:53 AM [phone]  Please note, I have never seen this patient or examined him. I was asked to reconcile medications and Dr. Shyrl would determine timing of discharge. Per Dr. Shyrl, discharge later today.

## 2024-03-04 NOTE — Discharge Instructions (Signed)

## 2024-03-04 NOTE — CV Procedure (Signed)
 [facility]  TAVR OPERATIVE NOTE   Date of Procedure:  03/04/2024  Preoperative Diagnosis: Severe Aortic Stenosis   Postoperative Diagnosis: Same   Procedure:   Transcatheter Aortic Valve Replacement - Transfemoral Approach  Edwards Sapien 3 Ultra Resilia THV (size 26 mm, model # [id number], serial # [id number] )   Co-Surgeons:  Lonni Cash, MD and Linnie Rayas , MD   Anesthesiologist:  Erma  Echocardiographer:  O'Neal  Pre-operative Echo Findings: Severe aortic stenosis Normal left ventricular systolic function  Post-operative Echo Findings: No paravalvular leak Normal left ventricular systolic function  BRIEF CLINICAL NOTE AND INDICATIONS FOR SURGERY  89 yo male with history of anemia, DM, COPD, chronic diastolic CHF, CAD, CKD stage IV, HTN, HLD, previous stroke, carotid artery disease, sleep apnea and severe aortic stenosis. Echo 12/28/23 with LVEF=65=70%. Normal RV function. Moderately severe aortic stenosis with mean gradient 36.5 mmHg, AVA 1.0 cm2, SVI 51, DI 0.21. Cardiac cath 01/07/24 with patent Circumflex stents with non-obstructive disease noted in the LAD, Circumflex and RCA. Severe aortic stenosis with mean gradient 37 mmHg, AVA 0.78 cm2.   During the course of the patient's preoperative work up they have been evaluated comprehensively by a multidisciplinary team of specialists coordinated through the [facility] in the [facility].  They have been demonstrated to suffer from symptomatic severe aortic stenosis as noted above. The patient has been counseled extensively as to the relative risks and benefits of all options for the treatment of severe aortic stenosis including long term medical therapy, conventional surgery for aortic valve replacement, and transcatheter aortic valve replacement.  The patient has been independently evaluated by Dr. Rayas with CT surgery and they are felt to be  at high risk for conventional surgical aortic valve replacement. The surgeon indicated the patient would be a poor candidate for conventional surgery. Based upon review of all of the patient's preoperative diagnostic tests they are felt to be candidate for transcatheter aortic valve replacement using the transfemoral approach as an alternative to high risk conventional surgery.    Following the decision to proceed with transcatheter aortic valve replacement, a discussion has been held regarding what types of management strategies would be attempted intraoperatively in the event of life-threatening complications, including whether or not the patient would be considered a candidate for the use of cardiopulmonary bypass and/or conversion to open sternotomy for attempted surgical intervention.  The patient has been advised of a variety of complications that might develop peculiar to this approach including but not limited to risks of death, stroke, paravalvular leak, aortic dissection or other major vascular complications, aortic annulus rupture, device embolization, cardiac rupture or perforation, acute myocardial infarction, arrhythmia, heart block or bradycardia requiring permanent pacemaker placement, congestive heart failure, respiratory failure, renal failure, pneumonia, infection, other late complications related to structural valve deterioration or migration, or other complications that might ultimately cause a temporary or permanent loss of functional independence or other long term morbidity.  The patient provides full informed consent for the procedure as described and all questions were answered preoperatively.    DETAILS OF THE OPERATIVE PROCEDURE  PREPARATION:   The patient is brought to the operating room on the above mentioned date and central monitoring was established by the anesthesia team including placement of a radial arterial line. The patient is placed in the supine position on the  operating table.  Intravenous antibiotics are administered. Conscious sedation is used.   Baseline transthoracic echocardiogram was performed.  The patient's chest, abdomen, both groins, and both lower extremities are prepared and draped in a sterile manner. A time out procedure is performed.   PERIPHERAL ACCESS:   Using the modified Seldinger technique, femoral arterial and venous access were obtained with placement of a 6 Fr sheath in the artery and a 7 Fr sheath in the vein on the left side using u/s guidance.  A pigtail diagnostic catheter was passed through the femoral arterial sheath under fluoroscopic guidance into the aortic root.  A temporary transvenous pacemaker catheter was passed through the femoral venous sheath under fluoroscopic guidance into the right ventricle.  The pacemaker was tested to ensure stable lead placement and pacemaker capture. Aortic root angiography was performed in order to determine the optimal angiographic angle for valve deployment.  TRANSFEMORAL ACCESS:  A micropuncture kit was used to gain access to the right femoral artery using u/s guidance. Position confirmed with angiography. Pre-closure with double ProGlide closure devices. The patient was heparinized systemically and ACT verified > 250 seconds.    A 14 Fr transfemoral E-sheath was introduced into the right femoral artery after progressively dilating over an Amplatz superstiff wire. An AL-2 catheter was used to direct a straight-tip exchange length wire across the native aortic valve into the left ventricle. This was exchanged out for a pigtail catheter and position was confirmed in the LV apex. Simultaneous LV and Ao pressures were recorded.  The pigtail catheter was then exchanged for a Safari wire in the LV apex.   TRANSCATHETER HEART VALVE DEPLOYMENT:  An Edwards Sapien 3 THV (size 26 mm) was prepared and crimped per manufacturer's guidelines, and the proper orientation of the valve is confirmed on the  [employer] delivery system. The valve was advanced through the introducer sheath using normal technique until in an appropriate position in the abdominal aorta beyond the sheath tip. The balloon was then retracted and using the fine-tuning wheel was centered on the valve. The valve was then advanced across the aortic arch using appropriate flexion of the catheter. The valve was carefully positioned across the aortic valve annulus. The Commander catheter was retracted using normal technique. Once final position of the valve has been confirmed by angiographic assessment, the valve is deployed while temporarily holding ventilation and during rapid ventricular pacing to maintain systolic blood pressure < 50 mmHg and pulse pressure < 10 mmHg. The balloon inflation is held for >3 seconds after reaching full deployment volume. Once the balloon has fully deflated the balloon is retracted into the ascending aorta and valve function is assessed using TTE. There is felt to be no paravalvular leak and no central aortic insufficiency.  The patient's hemodynamic recovery following valve deployment is good.  The deployment balloon and guidewire are both removed. Echo demostrated acceptable post-procedural gradients, stable mitral valve function, and no AI.   PROCEDURE COMPLETION:  The sheath was then removed and closure devices were completed. Protamine  was administered once femoral arterial repair was complete. The temporary pacemaker, pigtail catheters and femoral sheaths were removed with a Mynx closure device placed in the artery and manual pressure used for venous hemostasis.    The patient tolerated the procedure well and is transported to the surgical intensive care in stable condition. There were no immediate intraoperative complications. All sponge instrument and needle counts are verified correct at completion of the operation.   No blood products were administered during the operation.  The patient  received a total of 30 mL of intravenous contrast during the  procedure.  LVEDP: 15 mmHg  Lonni Cash MD, FACC 03/04/2024 12:44 PM

## 2024-03-04 NOTE — Anesthesia Postprocedure Evaluation (Signed)
 Anesthesia Post Note  Patient: ZARIAH JOST  Procedure(s) Performed: Transcatheter Aortic Valve Replacement, Transfemoral (Right) ECHOCARDIOGRAM, TRANSTHORACIC     Patient location during evaluation: PACU Anesthesia Type: MAC Level of consciousness: awake and alert Pain management: pain level controlled Vital Signs Assessment: post-procedure vital signs reviewed and stable Respiratory status: spontaneous breathing, nonlabored ventilation, respiratory function stable and patient connected to nasal cannula oxygen  Cardiovascular status: stable and blood pressure returned to baseline Postop Assessment: no apparent nausea or vomiting Anesthetic complications: no   There were no known notable events for this encounter.  Last Vitals:  Vitals:   03/04/24 1545 03/04/24 1550  BP: (!) 178/77 (!) 140/63  Pulse:    Resp:    Temp: (!) 34.8 C   SpO2: 98% 98%    Last Pain:  Vitals:   03/04/24 1545  TempSrc: Rectal  PainSc:                  Thom JONELLE Peoples

## 2024-03-04 NOTE — Consult Note (Signed)
 Advanced Heart Failure Team Consult Note   Primary Physician: Okey Carlin Redbird, MD Cardiologist:  Oneil Parchment, MD  Reason for Consultation: Hypotension s/p TAVR  HPI:    Marco Acevedo is seen today for evaluation of hypotension at the request of Izetta Hummer PA-C.    89 yo male with history of DM, COPD, chronic diastolic CHF, CAD, CKD stage IV, HTN, HLD, previous stroke, carotid artery disease, sleep apnea and severe aortic stenosis  Echo 12/28/23 with LVEF 65-70%. Normal RV function. Moderately severe aortic stenosis with mean gradient 36.5 mmHg, AVA 1.0 cm2, SVI 51, DI 0.21. Cardiac cath 01/07/24 with patent Circumflex stents with non-obstructive disease noted in the LAD, Circumflex and RCA. Severe aortic stenosis with mean gradient 37 mmHg, AVA 0.78 cm2.   Underwent successful TAVR today.   Post-TAVR developed hypotension and bradycardia. Post op ECG with new LBBB and sinus brady. HRs  ~45 bpm on tele. BP 67/40. Groins look good with no hematoma. Hg 11.6 on Istat. Pt complaining of 3/10 chest pain that is similar to previous angina. Seen by Structural team and given 500 CCs of NS with improvement in Bp to 77/42. Stat echo with new small circumferential pericardial effusion but no tamponade. Rapid response called to start levophed  on the floor. Hypertensive with 5 cc. HR and BP stablized on 1 mcg.   On arrival to [facility] patient with stable vitals. NE able to be weaned off. Developed difficulty with R hand coordination and Code Stroke called   Past Medical History: Past Medical History:  Diagnosis Date   Anemia    low iron    Aortic stenosis    Arthritis    BPH (benign prostatic hyperplasia)    CAD (coronary artery disease)    Chronic diastolic CHF (congestive heart failure) (HCC)    CKD (chronic kidney disease), stage IV (HCC)    Claustrophobia    Complication of anesthesia    has to have head elevated to keep from strangling on post nasal drip   COPD (chronic obstructive  pulmonary disease) (HCC)    Diabetes mellitus type II    type 2   GERD (gastroesophageal reflux disease)    Gout    Granuloma annulare    History of hiatal hernia    History of kidney stones    Litthotrispy   Hyperlipidemia    Hypertension    Neuropathy    Obesity    OSA (obstructive sleep apnea)    Stroke (HCC) 10/2017   Weakness right arm and leg    Past Surgical History: Past Surgical History:  Procedure Laterality Date   BLEPHAROPLASTY     CARDIAC CATHETERIZATION  [id number]   X 2 stents   CHOLECYSTECTOMY N/A 09/23/2014   Procedure: LAPAROSCOPIC CHOLECYSTECTOMY WITH INTRAOPERATIVE CHOLANGIOGRAM;  Surgeon: Deward Null [name], MD;  Location: [facility] OR;  Service: General;  Laterality: N/A;   COLONOSCOPY     COLONOSCOPY WITH PROPOFOL  N/A 03/22/2016   Procedure: COLONOSCOPY WITH PROPOFOL ;  Surgeon: Jerrell Sol, MD;  Location: [facility] ENDOSCOPY;  Service: Endoscopy;  Laterality: N/A;   ENDARTERECTOMY Left 10/19/2017   Procedure: ENDARTERECTOMY CAROTID LEFT;  Surgeon: Oris Krystal FALCON, MD;  Location: [facility] OR;  Service: Vascular;  Laterality: Left;   ESOPHAGOGASTRODUODENOSCOPY (EGD) WITH PROPOFOL  N/A 03/22/2016   Procedure: ESOPHAGOGASTRODUODENOSCOPY (EGD) WITH PROPOFOL ;  Surgeon: Jerrell Sol, MD;  Location: [facility] ENDOSCOPY;  Service: Endoscopy;  Laterality: N/A;   EYE SURGERY     both cataracts   INTRAMEDULLARY (IM) NAIL INTERTROCHANTERIC Left  08/11/2022   Procedure: INTRAMEDULLARY (IM) NAIL INTERTROCHANTERIC;  Surgeon: Edna Toribio LABOR, MD;  Location: [facility] OR;  Service: Orthopedics;  Laterality: Left;   KNEE ARTHROSCOPY WITH MEDIAL MENISECTOMY Right 08/19/2013   Procedure: RIGHT KNEE ARTHROSCOPY WITH PARTIAL MEDIAL MENISECTOMY AND CHONDROPLASTY;  Surgeon: Maude KANDICE Herald, MD;  Location: [facility];  Service: Orthopedics;  Laterality: Right;   LEFT HEART CATH AND CORONARY ANGIOGRAPHY N/A 06/28/2016   Procedure: Left Heart Cath and Coronary Angiography;  Surgeon: Victory LELON Sharps, MD;   Location: [facility] INVASIVE CV LAB;  Service: Cardiovascular;  Laterality: N/A;   LEFT HEART CATH AND CORONARY ANGIOGRAPHY N/A 02/25/2019   Procedure: LEFT HEART CATH AND CORONARY ANGIOGRAPHY;  Surgeon: Sharps Victory LELON, MD;  Location: [facility] INVASIVE CV LAB;  Service: Cardiovascular;  Laterality: N/A;   LEFT HEART CATHETERIZATION WITH CORONARY ANGIOGRAM N/A 10/03/2011   Procedure: LEFT HEART CATHETERIZATION WITH CORONARY ANGIOGRAM;  Surgeon: Victory LELON Sharps DOUGLAS, MD;  Location: [facility] CATH LAB;  Service: Cardiovascular;  Laterality: N/A;   LEFT HEART CATHETERIZATION WITH CORONARY ANGIOGRAM N/A 09/12/2012   Procedure: LEFT HEART CATHETERIZATION WITH CORONARY ANGIOGRAM;  Surgeon: Victory LELON Sharps DOUGLAS, MD;  Location: [facility] CATH LAB;  Service: Cardiovascular;  Laterality: N/A;   LIPOMA EXCISION     Biospy only; left parotid gland   LITHOTRIPSY     none     PERCUTANEOUS CORONARY STENT INTERVENTION (PCI-S) N/A 09/17/2012   Procedure: PERCUTANEOUS CORONARY STENT INTERVENTION (PCI-S);  Surgeon: Victory LELON Sharps DOUGLAS, MD;  Location: [facility] CATH LAB;  Service: Cardiovascular;  Laterality: N/A;   RIGHT/LEFT HEART CATH AND CORONARY ANGIOGRAPHY N/A 01/07/2024   Procedure: RIGHT/LEFT HEART CATH AND CORONARY ANGIOGRAPHY;  Surgeon: Wonda Sharper, MD;  Location: [facility] INVASIVE CV LAB;  Service: Cardiovascular;  Laterality: N/A;   TRIGGER FINGER RELEASE Left 12/21/2015   Procedure: LEFT LONG FINGER TRIGGER RELEASE ;  Surgeon: Alm Hummer, MD;  Location: [facility];  Service: Orthopedics;  Laterality: Left;    Family History: Family History  Problem Relation Age of Onset   Aortic aneurysm Father    Aneurysm Mother        brain   Cancer Brother    COPD Sister    Other Sister        health hx unknown   Other Sister        health hx unknown   Other Brother        health hx unknown    Social History: Social History   Socioeconomic History   Marital status: Married    Spouse name: Not on file   Number of children: 2    Years of education: Not on file   Highest education level: Not on file  Occupational History   Occupation: retired   Occupation: Retired from [employer]  Tobacco Use   Smoking status: Former    Current packs/day: 0.00    Average packs/day: 1 pack/day for 64.0 years (64.0 ttl pk-yrs)    Types: Cigarettes    Start date: 05/29/1947    Quit date: 05/29/2011    Years since quitting: 12.7   Smokeless tobacco: Never  Vaping Use   Vaping status: Never Used  Substance and Sexual Activity   Alcohol use: Yes    Comment: rarely   Drug use: No   Sexual activity: Not Currently  Other Topics Concern   Not on file  Social History Narrative   Denies Caffeine use    Social Drivers of Health  Financial Resource Strain: Not on file  Food Insecurity: No Food Insecurity (08/10/2022)   Hunger Vital Sign    Worried About Running Out of Food in the Last Year: Never true    Ran Out of Food in the Last Year: Never true  Transportation Needs: No Transportation Needs (08/10/2022)   PRAPARE - Administrator, Civil Service (Medical): No    Lack of Transportation (Non-Medical): No  Physical Activity: Not on file  Stress: Not on file  Social Connections: Not on file    Allergies:  Allergies  Allergen Reactions   Cimetidine Other (See Comments)    Anxiety Attacks   Metformin  Hcl     Other reaction(s): decreased kidney function    Objective:    Vital Signs:   Temp:  [94.7 F (34.8 C)-97.8 F (36.6 C)] 97.7 F (36.5 C) (11/25 1941) Pulse Rate:  [46-64] 64 (11/25 1930) Resp:  [14-21] 14 (11/25 1930) BP: (67-196)/(40-89) 127/63 (11/25 1930) SpO2:  [92 %-100 %] 95 % (11/25 1930) Weight:  [83.9 kg] 83.9 kg (11/25 0905) Last BM Date :  (PTA)  Weight change: Filed Weights   03/03/24 1200 03/04/24 0905  Weight: 85.3 kg 83.9 kg    Intake/Output:   Intake/Output Summary (Last 24 hours) at 03/04/2024 1951 Last data filed at 03/04/2024 1900 Gross per 24 hour  Intake  1241.25 ml  Output 410 ml  Net 831.25 ml      Physical Exam    General: Elderly. No resp difficulty HEENT: normal Neck: supple.  Cor: PMI nondisplaced. Regular rate & rhythm. No rubs, gallops or murmurs. Lungs: clear Abdomen: soft, nontender, nondistended. No hepatosplenomegaly. No bruits or masses. Good bowel sounds. Extremities: no cyanosis, clubbing, rash, edema Neuro: alert & orientedx3, cranial nerves grossly intact. moves all 4 extremities w/o difficulty. ? Mild dysscordiantion of R hand Affect pleasant   Telemetry   Sinus LBBB 60s   Labs   Basic Metabolic Panel: Recent Labs  Lab 02/29/24 0950 03/04/24 1258  NA 137 141  K 4.3 4.6  CL 104 106  CO2 26  --   GLUCOSE 160* 199*  BUN 46* 40*  CREATININE 2.77* 2.60*  CALCIUM  8.9  --     Liver Function Tests: Recent Labs  Lab 02/29/24 0950  AST 27  ALT 29  ALKPHOS 95  BILITOT 1.5*  PROT 6.7  ALBUMIN  3.9   No results for input(s): LIPASE, AMYLASE in the last 168 hours. No results for input(s): AMMONIA in the last 168 hours.  CBC: Recent Labs  Lab 02/29/24 0950 03/04/24 1258 03/04/24 1641  WBC 5.0  --   --   HGB 13.2 11.6* 11.3*  HCT 40.1 34.0* 35.1*  MCV 90.3  --   --   PLT 152  --   --     Cardiac Enzymes: No results for input(s): CKTOTAL, CKMB, CKMBINDEX, TROPONINI in the last 168 hours.  BNP: BNP (last 3 results) No results for input(s): BNP in the last 8760 hours.  ProBNP (last 3 results) No results for input(s): PROBNP in the last 8760 hours.   CBG: Recent Labs  Lab 03/04/24 0909 03/04/24 1110 03/04/24 1602  GLUCAP 171* 149* 152*    Coagulation Studies: No results for input(s): LABPROT, INR in the last 72 hours.   Imaging   ECHOCARDIOGRAM LIMITED Result Date: 03/04/2024    ECHOCARDIOGRAM LIMITED REPORT   Patient Name:   Marco Acevedo Date of Exam: 03/04/2024 Medical Rec #:  [id number]  Height:       68.0 in Accession #:    [id number]     Weight:        185.0 lb Date of Birth:  September 13, 1934       BSA:          1.977 m Patient Age:    89 years       BP:           68/49 mmHg Patient Gender: M              HR:           46 bpm. Exam Location:  Inpatient Procedure: Limited Echo, Cardiac Doppler and Color Doppler (Both Spectral and            Color Flow Doppler were utilized during procedure). STAT ECHO Indications:    chest pain status post TAVR  History:        Patient has prior history of Echocardiogram examinations, most                 recent 03/04/2024. CAD, COPD and chronic kidney disease; Risk                 Factors:Sleep Apnea, Hypertension and Diabetes.                 Aortic Valve: 26 mm Sapien prosthetic, stented (TAVR) valve is                 present in the aortic position. Procedure Date: 03/04/24.  Sonographer:    Tinnie Barefoot RDCS Referring Phys: [id number] KATHRYN R THOMPSON IMPRESSIONS  1. Small pericardial effusion is present that is new. Circumferential. No evidence of RA/RV collapse. No findings to suggest tamponade. a small pericardial effusion is present. The pericardial effusion is circumferential. There is no evidence of cardiac  tamponade.  2. Left ventricular ejection fraction, by estimation, is 55 to 60%. The left ventricle has normal function.  3. Right ventricular systolic function is normal. The right ventricular size is normal.  4. The aortic valve has been repaired/replaced. Aortic valve regurgitation is not visualized. There is a 26 mm Sapien prosthetic (TAVR) valve present in the aortic position. Procedure Date: 03/04/24. Echo findings are consistent with normal structure and function of the aortic valve prosthesis. Aortic valve area, by VTI measures 2.77 cm. Aortic valve mean gradient measures 2.0 mmHg. Aortic valve Vmax measures 1.03 m/s. FINDINGS  Left Ventricle: Left ventricular ejection fraction, by estimation, is 55 to 60%. The left ventricle has normal function. Right Ventricle: The right ventricular size is normal. No  increase in right ventricular wall thickness. Right ventricular systolic function is normal. Pericardium: Small pericardial effusion is present that is new. Circumferential. No evidence of RA/RV collapse. No findings to suggest tamponade. A small pericardial effusion is present. The pericardial effusion is circumferential. There is no evidence of cardiac tamponade. Mitral Valve: MV peak gradient, 4.2 mmHg. The mean mitral valve gradient is 2.0 mmHg. Aortic Valve: The aortic valve has been repaired/replaced. Aortic valve regurgitation is not visualized. Aortic valve mean gradient measures 2.0 mmHg. Aortic valve peak gradient measures 4.2 mmHg. Aortic valve area, by VTI measures 2.77 cm. There is a 26 mm Sapien prosthetic, stented (TAVR) valve present in the aortic position. Procedure Date: 03/04/24. Echo findings are consistent with normal structure and function of the aortic valve prosthesis. Aorta: The aortic root and ascending aorta are structurally normal, with no evidence of dilitation. Additional Comments: Spectral Doppler performed.  Color Doppler performed.  LEFT VENTRICLE PLAX 2D LVOT diam:     2.10 cm LV SV:         68 LV SV Index:   35 LVOT Area:     3.46 cm  IVC IVC diam: 2.20 cm AORTIC VALVE AV Area (Vmax):    2.98 cm AV Area (Vmean):   3.02 cm AV Area (VTI):     2.77 cm AV Vmax:           103.00 cm/s AV Vmean:          65.200 cm/s AV VTI:            0.246 m AV Peak Grad:      4.2 mmHg AV Mean Grad:      2.0 mmHg LVOT Vmax:         88.60 cm/s LVOT Vmean:        56.800 cm/s LVOT VTI:          0.197 m LVOT/AV VTI ratio: 0.80  AORTA Ao Asc diam: 2.70 cm MITRAL VALVE MV Peak grad: 4.2 mmHg  SHUNTS MV Mean grad: 2.0 mmHg  Systemic VTI:  0.20 m MV Vmax:      1.03 m/s  Systemic Diam: 2.10 cm MV Vmean:     61.0 cm/s Darryle Decent MD Electronically signed by Darryle Decent MD Signature Date/Time: 03/04/2024/4:04:33 PM    Final    ECHOCARDIOGRAM LIMITED Result Date: 03/04/2024    ECHOCARDIOGRAM LIMITED  REPORT   Patient Name:   Marco Acevedo Date of Exam: 03/04/2024 Medical Rec #:  [id number]      Height:       68.0 in Accession #:    [id number]     Weight:       185.0 lb Date of Birth:  Sep 20, 1934       BSA:          1.977 m Patient Age:    89 years       BP:           190/86 mmHg Patient Gender: M              HR:           57 bpm. Exam Location:  Inpatient Procedure: Limited Echo, Cardiac Doppler and Color Doppler (Both Spectral and            Color Flow Doppler were utilized during procedure). Indications:     Aortic Stenosis i35.0  History:         Patient has prior history of Echocardiogram examinations, most                  recent 12/28/2023. CHF, CAD, COPD; Risk Factors:Hypertension,                  Dyslipidemia, Sleep Apnea and Diabetes.                  Aortic Valve: 26 mm Sapien prosthetic, stented (TAVR) valve is                  present in the aortic position. Procedure Date: 03/04/2024.  Sonographer:     Damien Senior RDCS Referring Phys:  [id number] LONNI JONETTA CASH Diagnosing Phys: Darryle Decent MD  Sonographer Comments: 26mm Edwards [phone] TAVR Implanted IMPRESSIONS  1. Echo guided TAVR. Normal prothesis. No regurgitation or paravalvular leak. The aortic valve has been repaired/replaced. Aortic valve regurgitation is not visualized. There is a 26  mm Sapien prosthetic (TAVR) valve present in the aortic position. Procedure Date: 03/04/2024. Echo findings are consistent with normal structure and function of the aortic valve prosthesis. Aortic valve area, by VTI measures 2.57 cm. Aortic valve mean gradient measures 3.0 mmHg. Aortic valve Vmax measures 1.02 m/s.  2. Left ventricular ejection fraction, by estimation, is 50 to 55%. The left ventricle has low normal function.  3. Right ventricular systolic function is normal. The right ventricular size is normal. FINDINGS  Left Ventricle: Left ventricular ejection fraction, by estimation, is 50 to 55%. The left ventricle has low normal function. Right  Ventricle: The right ventricular size is normal. No increase in right ventricular wall thickness. Right ventricular systolic function is normal. Pericardium: Trivial pericardial effusion is present. Presence of epicardial fat layer. Mitral Valve: MV peak gradient, 7.4 mmHg. The mean mitral valve gradient is 4.0 mmHg. Tricuspid Valve: The tricuspid valve is grossly normal. Tricuspid valve regurgitation is trivial. No evidence of tricuspid stenosis. Aortic Valve: Echo guided TAVR. Normal prothesis. No regurgitation or paravalvular leak. The aortic valve has been repaired/replaced. Aortic valve regurgitation is not visualized. Aortic valve mean gradient measures 3.0 mmHg. Aortic valve peak gradient measures 4.2 mmHg. Aortic valve area, by VTI measures 2.57 cm. There is a 26 mm Sapien prosthetic, stented (TAVR) valve present in the aortic position. Procedure Date: 03/04/2024. Echo findings are consistent with normal structure and function of the aortic valve prosthesis. Additional Comments: Spectral Doppler performed. Color Doppler performed.  LEFT VENTRICLE PLAX 2D LVOT diam:     2.10 cm LV SV:         68 LV SV Index:   35 LVOT Area:     3.46 cm  AORTIC VALVE AV Area (Vmax):    2.49 cm AV Area (Vmean):   2.50 cm AV Area (VTI):     2.57 cm AV Vmax:           102.00 cm/s AV Vmean:          77.100 cm/s AV VTI:            0.265 m AV Peak Grad:      4.2 mmHg AV Mean Grad:      3.0 mmHg LVOT Vmax:         73.40 cm/s LVOT Vmean:        55.700 cm/s LVOT VTI:          0.197 m LVOT/AV VTI ratio: 0.74 MITRAL VALVE MV Peak grad: 7.4 mmHg  SHUNTS MV Mean grad: 4.0 mmHg  Systemic VTI:  0.20 m MV Vmax:      1.36 m/s  Systemic Diam: 2.10 cm MV Vmean:     98.7 cm/s Darryle Decent MD Electronically signed by Darryle Decent MD Signature Date/Time: 03/04/2024/12:41:51 PM    Final    Structural Heart Procedure Result Date: 03/04/2024 See surgical note for result.    Medications:     Current Medications:  allopurinol   100 mg  Oral Daily   aspirin  EC  81 mg Oral Daily   atorvastatin   80 mg Oral q1800   clopidogrel   75 mg Oral Daily   dapagliflozin  propanediol  10 mg Oral Daily   diazepam   2.5 mg Oral Once   docusate sodium   100 mg Oral QPM   ferrous sulfate   325 mg Oral Daily   gabapentin   600 mg Oral QHS   guaiFENesin   400 mg Oral BID   insulin  aspart  0-24 Units Subcutaneous TID AC & HS  pantoprazole   40 mg Oral Daily   primidone   50 mg Oral QHS   sodium chloride  flush  3 mL Intravenous Q12H    Infusions:  sodium chloride  50 mL/hr at 03/04/24 1600   sodium chloride       ceFAZolin  (ANCEF ) IV     nitroGLYCERIN      norepinephrine  (LEVOPHED ) Adult infusion 1 mcg/min (03/04/24 1900)    Assessment/Plan   1. Post-TAVR hypotension - in setting of new LBBB and bradycardia - resolved with IVF and transient NE support - echo stable.  - continue to follow  2. New LBBB - in setting of TAVR - continue to follow on tele  3. R hand dyscoordination - possible post-procedure CVA. Very mild - Code stroke called - pending MRI  4. Chest pain - stable for him - trend trops  5. CKD  - Scr stable 2.6-2.7 - avoid hypotension  CRITICAL CARE Performed by: Cherrie Sieving  Total critical care time: 55 minutes  Critical care time was exclusive of separately billable procedures and treating other patients.  Critical care was necessary to treat or prevent imminent or life-threatening deterioration.  Critical care was time spent personally by me (independent of midlevel providers or residents) on the following activities: development of treatment plan with patient and/or surrogate as well as nursing, discussions with consultants, evaluation of patient's response to treatment, examination of patient, obtaining history from patient or surrogate, ordering and performing treatments and interventions, ordering and review of laboratory studies, ordering and review of radiographic studies, pulse oximetry and  re-evaluation of patient's condition.   Length of Stay: 0  Sieving Cherrie, MD  03/04/2024, 7:51 PM    Advanced Heart Failure Team Pager [phone] (M-F; 7a - 5p)  Please contact [facility] for night-coverage after hours (4p -7a ) and weekends on [email or web address]

## 2024-03-04 NOTE — Significant Event (Addendum)
 Rapid Response Event Note   Reason for Call :  SBP 70s s/p TAVR  Initial Focused Assessment:  Patient A&O, c/o chest pain 3/10 and right arm heaviness that was present upon waking up from procedure. Patient warm and dry--bair hugger already in place. Echo being performed; ICU tx orders in place. Bolus finishing as levo gtt started; BP with some improvement from fluids alone. HR and continued BP improvement after levo started. Right arm heaviness resolved with increase in BP. No focal deficits seen.   68/49 (49) HR 40s RR 16 O2 98% 2L [place] Temp 94.7 rectal; unable to obtain other CBG 152  Interventions/Plan of Care:  PA at bedside ECHO at bedside NS bolus already infusing, 500ml Levo gtt started @5mcg /min Tx ICU  140/63 (86) HR 65 Levo titrated down to 1mcg/min upon arrival to [facility]  Event Summary:  MD Notified: Rockie PA at bedside Call Time: 1511 Arrival Time: 1520 End Time: 1600  Tonna Chiquita POUR, RN

## 2024-03-04 NOTE — Transfer of Care (Signed)
 Immediate Anesthesia Transfer of Care Note  Patient: Marco Acevedo  Procedure(s) Performed: Transcatheter Aortic Valve Replacement, Transfemoral (Right) ECHOCARDIOGRAM, TRANSTHORACIC  Patient Location: PACU and Cath Lab  Anesthesia Type:MAC  Level of Consciousness: awake and drowsy  Airway & Oxygen  Therapy: Patient Spontanous Breathing and Patient connected to nasal cannula oxygen   Post-op Assessment: Report given to RN and Post -op Vital signs reviewed and stable  Post vital signs: Reviewed and stable  Last Vitals:  Vitals Value Taken Time  BP 107/77 03/04/24 12:45  Temp    Pulse 62 03/04/24 12:48  Resp 16 03/04/24 12:48  SpO2 93 % 03/04/24 12:48  Vitals shown include unfiled device data.  Last Pain:  Vitals:   03/04/24 1234  TempSrc:   PainSc: Asleep         Complications: There were no known notable events for this encounter.

## 2024-03-04 NOTE — Progress Notes (Addendum)
  [facility]   MULTIDISCIPLINARY HEART VALVE TEAM  Called to bedside to see pt due to hypotension and bradycardia s/p TAVR. Post op ECG with new LBBB and sinus brady. HRs currently sinus with HRs in ~45 bpm on tele. BP 67/40. Groins look good with no hematoma. Hg 11.6 on Istat. Pt complaining of 3/10 chest pain that is similar to previous angina. Also, complaining of right arm pain. Pt given 500 CCs of NS with improvement in Bp to 77/42. Stat echo with new small circumferential pericardial effusion but no tamponade. Rapid response called to start levophed  on the floor. Hypertensive with 5 cc. Now HR and BP stable on 1 mcg. Okay to continue to run through peripheral IV. Repeat stat H/H/ Transferred to 7Y85.   Lamarr Hummer PA-C  [facility]

## 2024-03-04 NOTE — Progress Notes (Signed)
 Patient arrived from cath lab to 4E INAD. Sleepy but arousable, able to answer questions appropriately.  BP 78/46 (57), HR 46; temp unreadable oral or axillary.  Beir Hugger placed on patient. Repeat BP 74/49 (58), HR 46.  Cards called.  RR called. Both came to bedside.  Rapid bolus administered. Right and left groin sites were level 0.  DP pulses dopplerable.  Rectal temp 94.7.  Levo initiated by RR and patient transferred to [facility].

## 2024-03-04 NOTE — Anesthesia Procedure Notes (Signed)
 Procedure Name: MAC Date/Time: 03/04/2024 11:20 AM  Performed by: Boyce Shilling, CRNAPre-anesthesia Checklist: Patient identified, Emergency Drugs available, Suction available, Timeout performed and Patient being monitored Patient Re-evaluated:Patient Re-evaluated prior to induction Oxygen  Delivery Method: Simple face mask Induction Type: IV induction Dental Injury: Teeth and Oropharynx as per pre-operative assessment

## 2024-03-05 ENCOUNTER — Inpatient Hospital Stay (HOSPITAL_COMMUNITY)

## 2024-03-05 ENCOUNTER — Encounter (HOSPITAL_COMMUNITY): Payer: Self-pay | Admitting: Cardiovascular Disease

## 2024-03-05 DIAGNOSIS — Z952 Presence of prosthetic heart valve: Secondary | ICD-10-CM

## 2024-03-05 DIAGNOSIS — I10 Essential (primary) hypertension: Secondary | ICD-10-CM

## 2024-03-05 DIAGNOSIS — I63412 Cerebral infarction due to embolism of left middle cerebral artery: Secondary | ICD-10-CM

## 2024-03-05 DIAGNOSIS — I63419 Cerebral infarction due to embolism of unspecified middle cerebral artery: Secondary | ICD-10-CM

## 2024-03-05 DIAGNOSIS — E785 Hyperlipidemia, unspecified: Secondary | ICD-10-CM | POA: Diagnosis not present

## 2024-03-05 DIAGNOSIS — I447 Left bundle-branch block, unspecified: Secondary | ICD-10-CM | POA: Diagnosis not present

## 2024-03-05 DIAGNOSIS — I639 Cerebral infarction, unspecified: Secondary | ICD-10-CM

## 2024-03-05 DIAGNOSIS — I63422 Cerebral infarction due to embolism of left anterior cerebral artery: Secondary | ICD-10-CM

## 2024-03-05 DIAGNOSIS — G3189 Other specified degenerative diseases of nervous system: Secondary | ICD-10-CM | POA: Diagnosis not present

## 2024-03-05 DIAGNOSIS — E119 Type 2 diabetes mellitus without complications: Secondary | ICD-10-CM

## 2024-03-05 DIAGNOSIS — R079 Chest pain, unspecified: Secondary | ICD-10-CM | POA: Diagnosis not present

## 2024-03-05 LAB — CBC
HCT: 37 % — ABNORMAL LOW (ref 39.0–52.0)
Hemoglobin: 12 g/dL — ABNORMAL LOW (ref 13.0–17.0)
MCH: 29.3 pg (ref 26.0–34.0)
MCHC: 32.4 g/dL (ref 30.0–36.0)
MCV: 90.5 fL (ref 80.0–100.0)
Platelets: 122 K/uL — ABNORMAL LOW (ref 150–400)
RBC: 4.09 MIL/uL — ABNORMAL LOW (ref 4.22–5.81)
RDW: 14.3 % (ref 11.5–15.5)
WBC: 7.2 K/uL (ref 4.0–10.5)
nRBC: 0 % (ref 0.0–0.2)

## 2024-03-05 LAB — ECHOCARDIOGRAM COMPLETE
AR max vel: 2.83 cm2
AV Area VTI: 2.98 cm2
AV Area mean vel: 2.76 cm2
AV Mean grad: 6 mmHg
AV Peak grad: 10.4 mmHg
Ao pk vel: 1.61 m/s
Area-P 1/2: 4.15 cm2
Height: 68 in
MV VTI: 3.09 cm2
S' Lateral: 3.2 cm
Weight: 3121.71 [oz_av]

## 2024-03-05 LAB — MAGNESIUM: Magnesium: 1.6 mg/dL — ABNORMAL LOW (ref 1.7–2.4)

## 2024-03-05 LAB — BASIC METABOLIC PANEL WITH GFR
Anion gap: 10 (ref 5–15)
BUN: 38 mg/dL — ABNORMAL HIGH (ref 8–23)
CO2: 23 mmol/L (ref 22–32)
Calcium: 8.7 mg/dL — ABNORMAL LOW (ref 8.9–10.3)
Chloride: 106 mmol/L (ref 98–111)
Creatinine, Ser: 2.64 mg/dL — ABNORMAL HIGH (ref 0.61–1.24)
GFR, Estimated: 23 mL/min — ABNORMAL LOW (ref >60)
Glucose, Bld: 133 mg/dL — ABNORMAL HIGH (ref 70–99)
Potassium: 4.2 mmol/L (ref 3.5–5.1)
Sodium: 139 mmol/L (ref 135–145)

## 2024-03-05 LAB — LIPID PANEL
Cholesterol: 76 mg/dL (ref 0–200)
HDL: 45 mg/dL (ref >40)
LDL Cholesterol: 15 mg/dL (ref 0–99)
Total CHOL/HDL Ratio: 1.7 ratio
Triglycerides: 79 mg/dL (ref <150)
VLDL: 16 mg/dL (ref 0–40)

## 2024-03-05 LAB — HEMOGLOBIN A1C
Hgb A1c MFr Bld: 6.8 % — ABNORMAL HIGH (ref 4.8–5.6)
Mean Plasma Glucose: 148 mg/dL

## 2024-03-05 LAB — GLUCOSE, CAPILLARY
Glucose-Capillary: 121 mg/dL — ABNORMAL HIGH (ref 70–99)
Glucose-Capillary: 214 mg/dL — ABNORMAL HIGH (ref 70–99)

## 2024-03-05 MED ORDER — STROKE: EARLY STAGES OF RECOVERY BOOK
Freq: Once | Status: AC
  Filled 2024-03-05: qty 1

## 2024-03-05 MED ORDER — ACETAMINOPHEN 325 MG PO TABS: 650.0000 mg | ORAL_TABLET | Freq: Four times a day (QID) | ORAL | Status: AC | PRN

## 2024-03-05 MED ORDER — CARVEDILOL 6.25 MG PO TABS
6.2500 mg | ORAL_TABLET | Freq: Two times a day (BID) | ORAL | Status: DC
  Administered 2024-03-05: 6.25 mg via ORAL
  Filled 2024-03-05: qty 1

## 2024-03-05 MED ORDER — FINASTERIDE 5 MG PO TABS
5.0000 mg | ORAL_TABLET | Freq: Every day | ORAL | Status: DC
  Administered 2024-03-05: 5 mg via ORAL
  Filled 2024-03-05: qty 1

## 2024-03-05 MED ORDER — AMLODIPINE BESYLATE 5 MG PO TABS
2.5000 mg | ORAL_TABLET | Freq: Every day | ORAL | Status: DC
  Administered 2024-03-05: 2.5 mg via ORAL
  Filled 2024-03-05: qty 1

## 2024-03-05 MED ORDER — ISOSORBIDE MONONITRATE ER 30 MG PO TB24
120.0000 mg | ORAL_TABLET | Freq: Every day | ORAL | Status: DC
  Administered 2024-03-05: 120 mg via ORAL
  Filled 2024-03-05: qty 4

## 2024-03-05 MED ORDER — ASPIRIN 81 MG PO TBEC: 81.0000 mg | DELAYED_RELEASE_TABLET | Freq: Every day | ORAL | Status: AC

## 2024-03-05 MED ORDER — MAGNESIUM SULFATE 2 GM/50ML IV SOLN
2.0000 g | Freq: Once | INTRAVENOUS | Status: AC
  Administered 2024-03-05: 2 g via INTRAVENOUS
  Filled 2024-03-05: qty 50

## 2024-03-05 NOTE — Progress Notes (Signed)
  Echocardiogram 2D Echocardiogram has been performed.  Koleen KANDICE Popper, RDCS 03/05/2024, 9:10 AM

## 2024-03-05 NOTE — Progress Notes (Addendum)
 Advanced Heart Failure Rounding Note  Cardiologist: Oneil Parchment, MD  Chief Complaint: Severe AS s/p TAVR; Post Procedure Hypotension   Patient Profile   89 yo male with history of DM, COPD, chronic diastolic CHF, CAD, CKD stage IV, HTN, HLD, previous stroke, carotid artery disease, sleep apnea and severe aortic stenosis, admitted for TAVR. Moved to the CCU post procedure for hypotension requiring NE support. CODE stroke later activated d/t acute rt hand numbness/tingling.   Subjective:    Off IV pressor support. BP stable. Feels better.   Head CT demonstrated small acute left MCA distribution infarct involving the posterior left frontoparietal region. Minimal associated petechial blood products without hemorrhagic transformation or significant regional mass effect + Few additional subcentimeter acute ischemic nonhemorrhagic infarcts involving the inferior left cerebellum.  Hypertensive overnight/this morning. He is back on home amlodipine  and Coreg . Doses just given.   Feeling better. Sensation in rt hand improving. Went for walk this morning and felt good w/o exertional CP or dyspnea.    Objective:   Weight Range: 88.5 kg Body mass index is 29.67 kg/m.   Vital Signs:   Temp:  [94.7 F (34.8 C)-98.7 F (37.1 C)] 98.7 F (37.1 C) (11/26 0700) Pulse Rate:  [0-182] 85 (11/26 0900) Resp:  [10-24] 18 (11/26 0900) BP: (67-196)/(40-113) 134/84 (11/26 1058) SpO2:  [88 %-100 %] 88 % (11/26 0900) Weight:  [88.5 kg] 88.5 kg (11/26 0410) Last BM Date :  (PTA)  Weight change: Filed Weights   03/03/24 1200 03/04/24 0905 03/05/24 0410  Weight: 85.3 kg 83.9 kg 88.5 kg    Intake/Output:   Intake/Output Summary (Last 24 hours) at 03/05/2024 1102 Last data filed at 03/05/2024 0655 Gross per 24 hour  Intake 1774.94 ml  Output 1110 ml  Net 664.94 ml      Physical Exam    General:  Well appearing. No resp difficulty HEENT: Normal Neck: Supple. JVP not elevated. Carotids  2+ bilat; no bruits. No lymphadenopathy or thyromegaly appreciated. Cor: PMI nondisplaced. Regular rate & rhythm. No rubs, gallops or murmurs. Lungs: Clear Abdomen: Soft, nontender, nondistended. No hepatosplenomegaly. No bruits or masses. Good bowel sounds. Extremities: No cyanosis, clubbing, rash, edema Neuro: Alert & orientedx3. moves all 4 extremities w/o difficulty. Affect pleasant   Telemetry   NSR 70s. Personally reviewed   EKG    NSR 67 bpm, personally reviewed   Labs    CBC Recent Labs    03/04/24 1641 03/05/24 0553  WBC  --  7.2  HGB 11.3* 12.0*  HCT 35.1* 37.0*  MCV  --  90.5  PLT  --  122*   Basic Metabolic Panel Recent Labs    88/74/74 1258 03/05/24 0553  NA 141 139  K 4.6 4.2  CL 106 106  CO2  --  23  GLUCOSE 199* 133*  BUN 40* 38*  CREATININE 2.60* 2.64*  CALCIUM   --  8.7*  MG  --  1.6*   Liver Function Tests No results for input(s): AST, ALT, ALKPHOS, BILITOT, PROT, ALBUMIN  in the last 72 hours. No results for input(s): LIPASE, AMYLASE in the last 72 hours. Cardiac Enzymes No results for input(s): CKTOTAL, CKMB, CKMBINDEX, TROPONINI in the last 72 hours.  BNP: BNP (last 3 results) No results for input(s): BNP in the last 8760 hours.  ProBNP (last 3 results) No results for input(s): PROBNP in the last 8760 hours.   D-Dimer No results for input(s): DDIMER in the last 72 hours. Hemoglobin A1C No results  for input(s): HGBA1C in the last 72 hours. Fasting Lipid Panel Recent Labs    03/05/24 0553  CHOL 76  HDL 45  LDLCALC 15  TRIG 79  CHOLHDL 1.7   Thyroid  Function Tests No results for input(s): TSH, T4TOTAL, T3FREE, THYROIDAB in the last 72 hours.  Invalid input(s): FREET3  Other results:   Imaging    MR ANGIO HEAD WO CONTRAST Result Date: 03/05/2024 CLINICAL DATA:  Follow-up stroke EXAM: MRA HEAD WITHOUT CONTRAST TECHNIQUE: Angiographic images of the Circle of Willis were  acquired using MRA technique without intravenous contrast. COMPARISON:  None Available. FINDINGS: MRA intracranial: Right-side: The internal carotid artery is patent without significant stenosis. The middle cerebral artery is patent without significant stenosis or proximal branch occlusion. The anterior cerebral artery is patent without significant stenosis or proximal branch occlusion. No aneurysm. Left side: The internal carotid artery is patent without significant stenosis. The middle cerebral artery is patent without significant stenosis or proximal branch occlusion. The anterior cerebral artery is patent without significant stenosis or proximal branch occlusion. No aneurysm. Posterior circulation: Both vertebral arteries are patent. The basilar artery is patent without significant stenosis. Both posterior cerebral arteries are patent. No aneurysm. Other comments: None IMPRESSION: Normal Electronically Signed   By: Nancyann Burns M.D.   On: 03/05/2024 10:51   ECHOCARDIOGRAM COMPLETE Result Date: 03/05/2024    ECHOCARDIOGRAM REPORT   Patient Name:   Marco Acevedo Date of Exam: 03/05/2024 Medical Rec #:  [id number]      Height:       68.0 in Accession #:    [id number]     Weight:       195.1 lb Date of Birth:  05/04/1934       BSA:          2.022 m Patient Age:    89 years       BP:           183/74 mmHg Patient Gender: M              HR:           84 bpm. Exam Location:  Inpatient Procedure: 2D Echo, Cardiac Doppler and Color Doppler (Both Spectral and Color            Flow Doppler were utilized during procedure). Indications:    Post TAVR evaluation V43.3/Z95.2  History:        Patient has prior history of Echocardiogram examinations, most                 recent 03/04/2024. CAD, COPD and CKD, 26 mm Sapien prosthetic,                 stented (TAVR) is present in the aortic position. Procedure                 date: 03/04/24 and Aortic Valve Disease, Signs/Symptoms:Chest                 Pain; Risk Factors:Current  Smoker, Sleep Apnea, Hypertension and                 Diabetes.                 Aortic Valve: 26 mm Edwards Sapien prosthetic, stented (TAVR)                 valve is present in the aortic position.  Sonographer:    Koleen Popper RDCS Referring Phys: [id number] KATHRYN  R THOMPSON  Sonographer Comments: Image acquisition challenging due to patient body habitus and Image acquisition challenging due to respiratory motion. IMPRESSIONS  1. Left ventricular ejection fraction, by estimation, is 60 to 65%. The left ventricle has normal function. The left ventricle has no regional wall motion abnormalities. There is mild left ventricular hypertrophy. Left ventricular diastolic parameters are consistent with Grade II diastolic dysfunction (pseudonormalization). Elevated left atrial pressure.  2. Right ventricular systolic function is normal. The right ventricular size is normal. Tricuspid regurgitation signal is inadequate for assessing PA pressure.  3. A small pericardial effusion is present.  4. The mitral valve is normal in structure. Trivial mitral valve regurgitation.  5. The aortic valve has been repaired/replaced. Aortic valve regurgitation is not visualized. There is a 26 mm Edwards Sapien prosthetic (TAVR) valve present in the aortic position. Echo findings are consistent with normal structure and function of the aortic valve prosthesis. Vmax 1.6 m/s, MG , EOA 2.5 cm^2, DI 0.79  6. The inferior vena cava is dilated in size with <50% respiratory variability, suggesting right atrial pressure of 15 mmHg. FINDINGS  Left Ventricle: Left ventricular ejection fraction, by estimation, is 60 to 65%. The left ventricle has normal function. The left ventricle has no regional wall motion abnormalities. The left ventricular internal cavity size was normal in size. There is  mild left ventricular hypertrophy. Left ventricular diastolic parameters are consistent with Grade II diastolic dysfunction (pseudonormalization). Elevated  left atrial pressure. Right Ventricle: The right ventricular size is normal. No increase in right ventricular wall thickness. Right ventricular systolic function is normal. Tricuspid regurgitation signal is inadequate for assessing PA pressure. Left Atrium: Left atrial size was not well visualized. Right Atrium: Right atrial size was not well visualized. Pericardium: A small pericardial effusion is present. Mitral Valve: The mitral valve is normal in structure. Trivial mitral valve regurgitation. MV peak gradient, 8.4 mmHg. The mean mitral valve gradient is 4.0 mmHg. Tricuspid Valve: The tricuspid valve is normal in structure. Tricuspid valve regurgitation is trivial. Aortic Valve: The aortic valve has been repaired/replaced. Aortic valve regurgitation is not visualized. Aortic valve mean gradient measures 6.0 mmHg. Aortic valve peak gradient measures 10.4 mmHg. Aortic valve area, by VTI measures 2.98 cm. There is a 26 mm Edwards Sapien prosthetic, stented (TAVR) valve present in the aortic position. Echo findings are consistent with normal structure and function of the aortic valve prosthesis. Pulmonic Valve: The pulmonic valve was not well visualized. Pulmonic valve regurgitation is not visualized. Aorta: The aortic root is normal in size and structure. Venous: The inferior vena cava is dilated in size with less than 50% respiratory variability, suggesting right atrial pressure of 15 mmHg. IAS/Shunts: The interatrial septum was not well visualized.  LEFT VENTRICLE PLAX 2D LVIDd:         5.10 cm   Diastology LVIDs:         3.20 cm   LV e' medial:    4.35 cm/s LV PW:         1.20 cm   LV E/e' medial:  19.7 LV IVS:        1.20 cm   LV e' lateral:   6.85 cm/s LVOT diam:     2.20 cm   LV E/e' lateral: 12.5 LV SV:         87 LV SV Index:   43 LVOT Area:     3.80 cm  RIGHT VENTRICLE         IVC TAPSE (M-mode):  2.0 cm  IVC diam: 2.50 cm LEFT ATRIUM         Index LA diam:    4.70 cm 2.32 cm/m  AORTIC VALVE AV Area  (Vmax):    2.83 cm AV Area (Vmean):   2.76 cm AV Area (VTI):     2.98 cm AV Vmax:           161.00 cm/s AV Vmean:          108.000 cm/s AV VTI:            0.291 m AV Peak Grad:      10.4 mmHg AV Mean Grad:      6.0 mmHg LVOT Vmax:         120.00 cm/s LVOT Vmean:        78.300 cm/s LVOT VTI:          0.228 m LVOT/AV VTI ratio: 0.78 MITRAL VALVE MV Area (PHT): 4.15 cm     SHUNTS MV Area VTI:   3.09 cm     Systemic VTI:  0.23 m MV Peak grad:  8.4 mmHg     Systemic Diam: 2.20 cm MV Mean grad:  4.0 mmHg MV Vmax:       1.45 m/s MV Vmean:      102.0 cm/s MV Decel Time: 183 msec MV E velocity: 85.70 cm/s MV A velocity: 152.00 cm/s MV E/A ratio:  0.56 Lonni Nanas MD Electronically signed by Lonni Nanas MD Signature Date/Time: 03/05/2024/10:39:25 AM    Final    MR BRAIN WO CONTRAST Result Date: 03/05/2024 CLINICAL DATA:  Follow-up examination for stroke. EXAM: MRI HEAD WITHOUT CONTRAST TECHNIQUE: Multiplanar, multiecho pulse sequences of the brain and surrounding structures were obtained without intravenous contrast. COMPARISON:  Comparison made with CT from 08/10/2022. FINDINGS: Brain: Generalized age-related cerebral atrophy. Patchy and confluent T2/FLAIR hyperintensity involving the periventricular and deep white matter both cerebral hemispheres, consistent with chronic small vessel ischemic disease, moderate in nature. Small remote left cerebellar infarct noted. Few small remote lacunar infarcts noted about the left basal ganglia. Focus of restricted diffusion seen involving the cortical aspect of the posterior left frontoparietal region, consistent with an acute left MCA distribution infarct (series 2, image 42). Minimal associated petechial blood products at this location without hemorrhagic transformation. No significant regional mass effect. Few additional small volume subcentimeter acute ischemic nonhemorrhagic infarcts seen involving the inferior left cerebellum (series 2, images 11, 10).  No other evidence for acute or subacute ischemia. Gray-white matter differentiation otherwise maintained. No other areas of chronic cortical infarction. No other acute intracranial hemorrhage. Few scattered chronic micro hemorrhages noted involving the supratentorial brain, likely hypertensive in nature. No mass lesion, midline shift or mass effect. No hydrocephalus or extra-axial fluid collection. Pituitary gland within normal limits. Vascular: Major intracranial vascular flow voids are maintained. Skull and upper cervical spine: Craniocervical junction within normal limits. Bone marrow signal intensity overall within normal limits. No scalp soft tissue abnormality. Sinuses/Orbits: Prior bilateral ocular lens replacement. Paranasal sinuses are largely clear. No significant mastoid effusion. Other: None. IMPRESSION: 1. Small acute left MCA distribution infarct involving the posterior left frontoparietal region. Minimal associated petechial blood products without hemorrhagic transformation or significant regional mass effect. 2. Few additional subcentimeter acute ischemic nonhemorrhagic infarcts involving the inferior left cerebellum. 3. Underlying age-related cerebral atrophy with moderate chronic microvascular ischemic disease, with a few scattered chronic lacunar infarcts involving the left basal ganglia and left cerebellum. Electronically Signed   By: Morene Nicholes HERO.D.  On: 03/05/2024 03:14   ECHOCARDIOGRAM LIMITED Result Date: 03/04/2024    ECHOCARDIOGRAM LIMITED REPORT   Patient Name:   Marco Acevedo Date of Exam: 03/04/2024 Medical Rec #:  [id number]      Height:       68.0 in Accession #:    [id number]     Weight:       185.0 lb Date of Birth:  March 01, 1935       BSA:          1.977 m Patient Age:    89 years       BP:           68/49 mmHg Patient Gender: M              HR:           46 bpm. Exam Location:  Inpatient Procedure: Limited Echo, Cardiac Doppler and Color Doppler (Both Spectral and             Color Flow Doppler were utilized during procedure). STAT ECHO Indications:    chest pain status post TAVR  History:        Patient has prior history of Echocardiogram examinations, most                 recent 03/04/2024. CAD, COPD and chronic kidney disease; Risk                 Factors:Sleep Apnea, Hypertension and Diabetes.                 Aortic Valve: 26 mm Sapien prosthetic, stented (TAVR) valve is                 present in the aortic position. Procedure Date: 03/04/24.  Sonographer:    Tinnie Barefoot RDCS Referring Phys: [id number] KATHRYN R THOMPSON IMPRESSIONS  1. Small pericardial effusion is present that is new. Circumferential. No evidence of RA/RV collapse. No findings to suggest tamponade. a small pericardial effusion is present. The pericardial effusion is circumferential. There is no evidence of cardiac  tamponade.  2. Left ventricular ejection fraction, by estimation, is 55 to 60%. The left ventricle has normal function.  3. Right ventricular systolic function is normal. The right ventricular size is normal.  4. The aortic valve has been repaired/replaced. Aortic valve regurgitation is not visualized. There is a 26 mm Sapien prosthetic (TAVR) valve present in the aortic position. Procedure Date: 03/04/24. Echo findings are consistent with normal structure and function of the aortic valve prosthesis. Aortic valve area, by VTI measures 2.77 cm. Aortic valve mean gradient measures 2.0 mmHg. Aortic valve Vmax measures 1.03 m/s. FINDINGS  Left Ventricle: Left ventricular ejection fraction, by estimation, is 55 to 60%. The left ventricle has normal function. Right Ventricle: The right ventricular size is normal. No increase in right ventricular wall thickness. Right ventricular systolic function is normal. Pericardium: Small pericardial effusion is present that is new. Circumferential. No evidence of RA/RV collapse. No findings to suggest tamponade. A small pericardial effusion is present. The  pericardial effusion is circumferential. There is no evidence of cardiac tamponade. Mitral Valve: MV peak gradient, 4.2 mmHg. The mean mitral valve gradient is 2.0 mmHg. Aortic Valve: The aortic valve has been repaired/replaced. Aortic valve regurgitation is not visualized. Aortic valve mean gradient measures 2.0 mmHg. Aortic valve peak gradient measures 4.2 mmHg. Aortic valve area, by VTI measures 2.77 cm. There is a 26 mm Sapien prosthetic, stented (TAVR) valve present in the  aortic position. Procedure Date: 03/04/24. Echo findings are consistent with normal structure and function of the aortic valve prosthesis. Aorta: The aortic root and ascending aorta are structurally normal, with no evidence of dilitation. Additional Comments: Spectral Doppler performed. Color Doppler performed.  LEFT VENTRICLE PLAX 2D LVOT diam:     2.10 cm LV SV:         68 LV SV Index:   35 LVOT Area:     3.46 cm  IVC IVC diam: 2.20 cm AORTIC VALVE AV Area (Vmax):    2.98 cm AV Area (Vmean):   3.02 cm AV Area (VTI):     2.77 cm AV Vmax:           103.00 cm/s AV Vmean:          65.200 cm/s AV VTI:            0.246 m AV Peak Grad:      4.2 mmHg AV Mean Grad:      2.0 mmHg LVOT Vmax:         88.60 cm/s LVOT Vmean:        56.800 cm/s LVOT VTI:          0.197 m LVOT/AV VTI ratio: 0.80  AORTA Ao Asc diam: 2.70 cm MITRAL VALVE MV Peak grad: 4.2 mmHg  SHUNTS MV Mean grad: 2.0 mmHg  Systemic VTI:  0.20 m MV Vmax:      1.03 m/s  Systemic Diam: 2.10 cm MV Vmean:     61.0 cm/s Darryle Decent MD Electronically signed by Darryle Decent MD Signature Date/Time: 03/04/2024/4:04:33 PM    Final    ECHOCARDIOGRAM LIMITED Result Date: 03/04/2024    ECHOCARDIOGRAM LIMITED REPORT   Patient Name:   Marco Acevedo Date of Exam: 03/04/2024 Medical Rec #:  [id number]      Height:       68.0 in Accession #:    [id number]     Weight:       185.0 lb Date of Birth:  [birth year]       BSA:          1.977 m Patient Age:    89 years       BP:           190/86 mmHg  Patient Gender: M              HR:           57 bpm. Exam Location:  Inpatient Procedure: Limited Echo, Cardiac Doppler and Color Doppler (Both Spectral and            Color Flow Doppler were utilized during procedure). Indications:     Aortic Stenosis i35.0  History:         Patient has prior history of Echocardiogram examinations, most                  recent 12/28/2023. CHF, CAD, COPD; Risk Factors:Hypertension,                  Dyslipidemia, Sleep Apnea and Diabetes.                  Aortic Valve: 26 mm Sapien prosthetic, stented (TAVR) valve is                  present in the aortic position. Procedure Date: 03/04/2024.  Sonographer:     Damien Senior RDCS Referring Phys:  [id number] LONNI BIRCH [facility] Diagnosing Phys: Darryle  O'Neal MD  Sonographer Comments: 26mm Edwards S3U TAVR Implanted IMPRESSIONS  1. Echo guided TAVR. Normal prothesis. No regurgitation or paravalvular leak. The aortic valve has been repaired/replaced. Aortic valve regurgitation is not visualized. There is a 26 mm Sapien prosthetic (TAVR) valve present in the aortic position. Procedure Date: 03/04/2024. Echo findings are consistent with normal structure and function of the aortic valve prosthesis. Aortic valve area, by VTI measures 2.57 cm. Aortic valve mean gradient measures 3.0 mmHg. Aortic valve Vmax measures 1.02 m/s.  2. Left ventricular ejection fraction, by estimation, is 50 to 55%. The left ventricle has low normal function.  3. Right ventricular systolic function is normal. The right ventricular size is normal. FINDINGS  Left Ventricle: Left ventricular ejection fraction, by estimation, is 50 to 55%. The left ventricle has low normal function. Right Ventricle: The right ventricular size is normal. No increase in right ventricular wall thickness. Right ventricular systolic function is normal. Pericardium: Trivial pericardial effusion is present. Presence of epicardial fat layer. Mitral Valve: MV peak gradient, 7.4 mmHg. The mean mitral  valve gradient is 4.0 mmHg. Tricuspid Valve: The tricuspid valve is grossly normal. Tricuspid valve regurgitation is trivial. No evidence of tricuspid stenosis. Aortic Valve: Echo guided TAVR. Normal prothesis. No regurgitation or paravalvular leak. The aortic valve has been repaired/replaced. Aortic valve regurgitation is not visualized. Aortic valve mean gradient measures 3.0 mmHg. Aortic valve peak gradient measures 4.2 mmHg. Aortic valve area, by VTI measures 2.57 cm. There is a 26 mm Sapien prosthetic, stented (TAVR) valve present in the aortic position. Procedure Date: 03/04/2024. Echo findings are consistent with normal structure and function of the aortic valve prosthesis. Additional Comments: Spectral Doppler performed. Color Doppler performed.  LEFT VENTRICLE PLAX 2D LVOT diam:     2.10 cm LV SV:         68 LV SV Index:   35 LVOT Area:     3.46 cm  AORTIC VALVE AV Area (Vmax):    2.49 cm AV Area (Vmean):   2.50 cm AV Area (VTI):     2.57 cm AV Vmax:           102.00 cm/s AV Vmean:          77.100 cm/s AV VTI:            0.265 m AV Peak Grad:      4.2 mmHg AV Mean Grad:      3.0 mmHg LVOT Vmax:         73.40 cm/s LVOT Vmean:        55.700 cm/s LVOT VTI:          0.197 m LVOT/AV VTI ratio: 0.74 MITRAL VALVE MV Peak grad: 7.4 mmHg  SHUNTS MV Mean grad: 4.0 mmHg  Systemic VTI:  0.20 m MV Vmax:      1.36 m/s  Systemic Diam: 2.10 cm MV Vmean:     98.7 cm/s Darryle Decent MD Electronically signed by Darryle Decent MD Signature Date/Time: 03/04/2024/12:41:51 PM    Final    Structural Heart Procedure Result Date: 03/04/2024 See surgical note for result.    Medications:     Scheduled Medications:  allopurinol   100 mg Oral Daily   amLODipine   2.5 mg Oral Daily   aspirin  EC  81 mg Oral Daily   atorvastatin   80 mg Oral q1800   carvedilol   6.25 mg Oral BID WC   clopidogrel   75 mg Oral Daily   dapagliflozin  propanediol  10 mg Oral  Daily   docusate sodium   100 mg Oral QPM   ferrous sulfate   325 mg  Oral Daily   finasteride   5 mg Oral Daily   gabapentin   600 mg Oral QHS   guaiFENesin   400 mg Oral BID   insulin  aspart  0-24 Units Subcutaneous TID AC & HS   isosorbide  mononitrate  120 mg Oral Daily   pantoprazole   40 mg Oral Daily   primidone   50 mg Oral QHS   sodium chloride  flush  3 mL Intravenous Q12H    Infusions:  sodium chloride      norepinephrine  (LEVOPHED ) Adult infusion Stopped (03/04/24 2157)    PRN Medications: sodium chloride , acetaminophen  **OR** acetaminophen , morphine  injection, ondansetron  (ZOFRAN ) IV, mouth rinse, oxyCODONE , sodium chloride  flush, traMADol      Assessment/Plan   1. Post-TAVR hypotension - in setting of new LBBB and bradycardia - resolved with IVF and transient NE support. Now off NE. BP mod elevated - echo stable  2. New LBBB - in setting of TAVR - no bradycardia on tele    3. Acute CVA - developed R hand dyscoordination post TAVR, CODE stroke called  - brain MRI w/ small acute L MCA infarct - management per neuro  - on ASA + statin    4. Chest pain - stable for him. Now resolved   5. CKD  - Scr stable 2.6-2.7, c/w baseline   Structural/Neuro to determine time of d/c.    Length of Stay: 1  Brittainy Simmons, PA-C  03/05/2024, 11:02 AM  Advanced Heart Failure Team Pager [phone] (M-F; 7a - 5p)  Please contact [facility] for night-coverage after hours (5p -7a ) and weekends on [email or web address]  Patient seen and examined with the above-signed Advanced Practice Provider and/or Housestaff. I personally reviewed laboratory data, imaging studies and relevant notes. I independently examined the patient and formulated the important aspects of the plan. I have edited the note to reflect any of my changes or salient points. I have personally discussed the plan with the patient and/or family.  Doing well this am. Brain MRI with small acute L MCA infarct. Neuro following  Still with LBBB but no bradycardia  Echo stable  General:   Sitting up in bed. No resp difficulty HEENT: normal Neck: supple. no JVD.  Cor: Regular rate & rhythm. No rubs, gallops or murmurs. Lungs: clear Abdomen: soft, nontender, nondistended.Good bowel sounds. Extremities: no cyanosis, clubbing, rash, edema Neuro: alert & orientedx3, cranial nerves grossly intact. moves all 4 extremities w/o difficulty. Affect pleasant  Stable from cardiac perspective. Ok for d/c from ICU perspective.   D/w Neuro and Structural Heart teams.   Toribio Fuel, MD  2:29 PM

## 2024-03-05 NOTE — Progress Notes (Signed)
 STROKE TEAM PROGRESS NOTE   SUBJECTIVE (INTERVAL HISTORY) RN at the bedside. Pt stated that his symptoms all resolved. MRA and CUS negative. On ASA and plavix .    OBJECTIVE Temp:  [96.7 F (35.9 C)-98.7 F (37.1 C)] 96.7 F (35.9 C) (11/26 1100) Pulse Rate:  [52-88] 77 (11/26 1500) Cardiac Rhythm: Normal sinus rhythm (11/26 0800) Resp:  [10-24] 20 (11/26 1500) BP: (76-187)/(52-113) 132/56 (11/26 1500) SpO2:  [88 %-100 %] 91 % (11/26 1500) Weight:  [88.5 kg] 88.5 kg (11/26 0410)  Recent Labs  Lab 03/04/24 1110 03/04/24 1602 03/04/24 2104 03/05/24 0606 03/05/24 1135  GLUCAP 149* 152* 159* 121* 214*   Recent Labs  Lab 02/29/24 0950 03/04/24 1258 03/05/24 0553  NA 137 141 139  K 4.3 4.6 4.2  CL 104 106 106  CO2 26  --  23  GLUCOSE 160* 199* 133*  BUN 46* 40* 38*  CREATININE 2.77* 2.60* 2.64*  CALCIUM  8.9  --  8.7*  MG  --   --  1.6*   Recent Labs  Lab 02/29/24 0950  AST 27  ALT 29  ALKPHOS 95  BILITOT 1.5*  PROT 6.7  ALBUMIN  3.9   Recent Labs  Lab 02/29/24 0950 03/04/24 1258 03/04/24 1641 03/05/24 0553  WBC 5.0  --   --  7.2  HGB 13.2 11.6* 11.3* 12.0*  HCT 40.1 34.0* 35.1* 37.0*  MCV 90.3  --   --  90.5  PLT 152  --   --  122*   No results for input(s): CKTOTAL, CKMB, CKMBINDEX, TROPONINI in the last 168 hours. No results for input(s): LABPROT, INR in the last 72 hours. No results for input(s): COLORURINE, LABSPEC, PHURINE, GLUCOSEU, HGBUR, BILIRUBINUR, KETONESUR, PROTEINUR, UROBILINOGEN, NITRITE, LEUKOCYTESUR in the last 72 hours.  Invalid input(s): APPERANCEUR     Component Value Date/Time   CHOL 76 03/05/2024 0553   TRIG 79 03/05/2024 0553   HDL 45 03/05/2024 0553   CHOLHDL 1.7 03/05/2024 0553   VLDL 16 03/05/2024 0553   LDLCALC 15 03/05/2024 0553   Lab Results  Component Value Date   HGBA1C 6.5 (H) 08/10/2022      Component Value Date/Time   LABOPIA NONE DETECTED 10/13/2017 1711   COCAINSCRNUR  NONE DETECTED 10/13/2017 1711   LABBENZ NONE DETECTED 10/13/2017 1711   AMPHETMU NONE DETECTED 10/13/2017 1711   THCU NONE DETECTED 10/13/2017 1711   LABBARB (A) 10/13/2017 1711    Result not available. Reagent lot number recalled by manufacturer.    No results for input(s): ETH in the last 168 hours.  I have personally reviewed the radiological images below and agree with the radiology interpretations.  VAS US  CAROTID Result Date: 03/05/2024 Carotid Arterial Duplex Study Patient Name:  EGBERT SEIDEL  Date of Exam:   03/05/2024 Medical Rec #: [id number]       Accession #:    [id number] Date of Birth: January 05, 1935        Patient Gender: M Patient Age:   89 years Exam Location:  [facility] Procedure:      VAS US  CAROTID Referring Phys: ARY CUMMINS --------------------------------------------------------------------------------  Indications:       CVA. Risk Factors:      Hypertension, hyperlipidemia, Diabetes, past history of                    smoking, coronary artery disease, prior CVA. Other Factors:     OSA, CKD, s/p TAVR, CHF, Left CEA 10/19/2017. Comparison Study:  Previous  exam on 05/20/2021 Performing Technologist: Ezzie Potters RVT, RDMS  Examination Guidelines: A complete evaluation includes B-mode imaging, spectral Doppler, color Doppler, and power Doppler as needed of all accessible portions of each vessel. Bilateral testing is considered an integral part of a complete examination. Limited examinations for reoccurring indications may be performed as noted.  Right Carotid Findings: +----------+--------+--------+--------+-------------------------+--------+           PSV cm/sEDV cm/sStenosisPlaque Description       Comments +----------+--------+--------+--------+-------------------------+--------+ CCA Prox  82      8               calcific                          +----------+--------+--------+--------+-------------------------+--------+ CCA Distal88      12               calcific and heterogenous         +----------+--------+--------+--------+-------------------------+--------+ ICA Prox  103     19      1-39%   calcific and heterogenous         +----------+--------+--------+--------+-------------------------+--------+ ICA Distal65      18                                                +----------+--------+--------+--------+-------------------------+--------+ ECA       174                                                       +----------+--------+--------+--------+-------------------------+--------+ +----------+--------+-------+----------------+-------------------+           PSV cm/sEDV cmsDescribe        Arm Pressure (mmHG) +----------+--------+-------+----------------+-------------------+ [id number]             Multiphasic, WNL                    +----------+--------+-------+----------------+-------------------+ +---------+--------+--+--------+--+---------+ VertebralPSV cm/s72EDV cm/s14Antegrade +---------+--------+--+--------+--+---------+  Left Carotid Findings: +----------+--------+--------+--------+------------------+------------------+           PSV cm/sEDV cm/sStenosisPlaque DescriptionComments           +----------+--------+--------+--------+------------------+------------------+ CCA Prox  94      14              calcific and focal                   +----------+--------+--------+--------+------------------+------------------+ CCA Distal83      9                                 intimal thickening +----------+--------+--------+--------+------------------+------------------+ ICA Prox  77      19                                                   +----------+--------+--------+--------+------------------+------------------+ ICA Distal77      20                                                   +----------+--------+--------+--------+------------------+------------------+  ECA       126                                                           +----------+--------+--------+--------+------------------+------------------+ +----------+--------+--------+----------------+-------------------+           PSV cm/sEDV cm/sDescribe        Arm Pressure (mmHG) +----------+--------+--------+----------------+-------------------+ [id number]             Multiphasic, WNL                    +----------+--------+--------+----------------+-------------------+ +---------+--------+--+--------+--+---------+ VertebralPSV cm/s72EDV cm/s15Antegrade +---------+--------+--+--------+--+---------+   Summary: Right Carotid: Velocities in the right ICA are consistent with a 1-39% stenosis. Left Carotid: The extracranial vessels were near-normal with only minimal wall               thickening or plaque. Vertebrals:  Bilateral vertebral arteries demonstrate antegrade flow. Subclavians: Normal flow hemodynamics were seen in bilateral subclavian              arteries. *See table(s) above for measurements and observations.     Preliminary    MR ANGIO HEAD WO CONTRAST Result Date: 03/05/2024 CLINICAL DATA:  Follow-up stroke EXAM: MRA HEAD WITHOUT CONTRAST TECHNIQUE: Angiographic images of the Circle of Willis were acquired using MRA technique without intravenous contrast. COMPARISON:  None Available. FINDINGS: MRA intracranial: Right-side: The internal carotid artery is patent without significant stenosis. The middle cerebral artery is patent without significant stenosis or proximal branch occlusion. The anterior cerebral artery is patent without significant stenosis or proximal branch occlusion. No aneurysm. Left side: The internal carotid artery is patent without significant stenosis. The middle cerebral artery is patent without significant stenosis or proximal branch occlusion. The anterior cerebral artery is patent without significant stenosis or proximal branch occlusion. No aneurysm. Posterior circulation: Both  vertebral arteries are patent. The basilar artery is patent without significant stenosis. Both posterior cerebral arteries are patent. No aneurysm. Other comments: None IMPRESSION: Normal Electronically Signed   By: Nancyann Burns M.D.   On: 03/05/2024 10:51   ECHOCARDIOGRAM COMPLETE Result Date: 03/05/2024    ECHOCARDIOGRAM REPORT   Patient Name:   MARISA HUFSTETLER Date of Exam: 03/05/2024 Medical Rec #:  [id number]      Height:       68.0 in Accession #:    [id number]     Weight:       195.1 lb Date of Birth:  09-15-1934       BSA:          2.022 m Patient Age:    89 years       BP:           183/74 mmHg Patient Gender: M              HR:           84 bpm. Exam Location:  Inpatient Procedure: 2D Echo, Cardiac Doppler and Color Doppler (Both Spectral and Color            Flow Doppler were utilized during procedure). Indications:    Post TAVR evaluation V43.3/Z95.2  History:        Patient has prior history of Echocardiogram examinations, most                 recent 03/04/2024.  CAD, COPD and CKD, 26 mm Sapien prosthetic,                 stented (TAVR) is present in the aortic position. Procedure                 date: 03/04/24 and Aortic Valve Disease, Signs/Symptoms:Chest                 Pain; Risk Factors:Current Smoker, Sleep Apnea, Hypertension and                 Diabetes.                 Aortic Valve: 26 mm Edwards Sapien prosthetic, stented (TAVR)                 valve is present in the aortic position.  Sonographer:    Koleen Popper RDCS Referring Phys: [id number] LAMARR SAUNDERS THOMPSON  Sonographer Comments: Image acquisition challenging due to patient body habitus and Image acquisition challenging due to respiratory motion. IMPRESSIONS  1. Left ventricular ejection fraction, by estimation, is 60 to 65%. The left ventricle has normal function. The left ventricle has no regional wall motion abnormalities. There is mild left ventricular hypertrophy. Left ventricular diastolic parameters are consistent with Grade II  diastolic dysfunction (pseudonormalization). Elevated left atrial pressure.  2. Right ventricular systolic function is normal. The right ventricular size is normal. Tricuspid regurgitation signal is inadequate for assessing PA pressure.  3. A small pericardial effusion is present.  4. The mitral valve is normal in structure. Trivial mitral valve regurgitation.  5. The aortic valve has been repaired/replaced. Aortic valve regurgitation is not visualized. There is a 26 mm Edwards Sapien prosthetic (TAVR) valve present in the aortic position. Echo findings are consistent with normal structure and function of the aortic valve prosthesis. Vmax 1.6 m/s, MG , EOA 2.5 cm^2, DI 0.79  6. The inferior vena cava is dilated in size with <50% respiratory variability, suggesting right atrial pressure of 15 mmHg. FINDINGS  Left Ventricle: Left ventricular ejection fraction, by estimation, is 60 to 65%. The left ventricle has normal function. The left ventricle has no regional wall motion abnormalities. The left ventricular internal cavity size was normal in size. There is  mild left ventricular hypertrophy. Left ventricular diastolic parameters are consistent with Grade II diastolic dysfunction (pseudonormalization). Elevated left atrial pressure. Right Ventricle: The right ventricular size is normal. No increase in right ventricular wall thickness. Right ventricular systolic function is normal. Tricuspid regurgitation signal is inadequate for assessing PA pressure. Left Atrium: Left atrial size was not well visualized. Right Atrium: Right atrial size was not well visualized. Pericardium: A small pericardial effusion is present. Mitral Valve: The mitral valve is normal in structure. Trivial mitral valve regurgitation. MV peak gradient, 8.4 mmHg. The mean mitral valve gradient is 4.0 mmHg. Tricuspid Valve: The tricuspid valve is normal in structure. Tricuspid valve regurgitation is trivial. Aortic Valve: The aortic valve has  been repaired/replaced. Aortic valve regurgitation is not visualized. Aortic valve mean gradient measures 6.0 mmHg. Aortic valve peak gradient measures 10.4 mmHg. Aortic valve area, by VTI measures 2.98 cm. There is a 26 mm Edwards Sapien prosthetic, stented (TAVR) valve present in the aortic position. Echo findings are consistent with normal structure and function of the aortic valve prosthesis. Pulmonic Valve: The pulmonic valve was not well visualized. Pulmonic valve regurgitation is not visualized. Aorta: The aortic root is normal in size and structure. Venous: The inferior vena  cava is dilated in size with less than 50% respiratory variability, suggesting right atrial pressure of 15 mmHg. IAS/Shunts: The interatrial septum was not well visualized.  LEFT VENTRICLE PLAX 2D LVIDd:         5.10 cm   Diastology LVIDs:         3.20 cm   LV e' medial:    4.35 cm/s LV PW:         1.20 cm   LV E/e' medial:  19.7 LV IVS:        1.20 cm   LV e' lateral:   6.85 cm/s LVOT diam:     2.20 cm   LV E/e' lateral: 12.5 LV SV:         87 LV SV Index:   43 LVOT Area:     3.80 cm  RIGHT VENTRICLE         IVC TAPSE (M-mode): 2.0 cm  IVC diam: 2.50 cm LEFT ATRIUM         Index LA diam:    4.70 cm 2.32 cm/m  AORTIC VALVE AV Area (Vmax):    2.83 cm AV Area (Vmean):   2.76 cm AV Area (VTI):     2.98 cm AV Vmax:           161.00 cm/s AV Vmean:          108.000 cm/s AV VTI:            0.291 m AV Peak Grad:      10.4 mmHg AV Mean Grad:      6.0 mmHg LVOT Vmax:         120.00 cm/s LVOT Vmean:        78.300 cm/s LVOT VTI:          0.228 m LVOT/AV VTI ratio: 0.78 MITRAL VALVE MV Area (PHT): 4.15 cm     SHUNTS MV Area VTI:   3.09 cm     Systemic VTI:  0.23 m MV Peak grad:  8.4 mmHg     Systemic Diam: 2.20 cm MV Mean grad:  4.0 mmHg MV Vmax:       1.45 m/s MV Vmean:      102.0 cm/s MV Decel Time: 183 msec MV E velocity: 85.70 cm/s MV A velocity: 152.00 cm/s MV E/A ratio:  0.56 Lonni Nanas MD Electronically signed by Lonni Nanas MD Signature Date/Time: 03/05/2024/10:39:25 AM    Final    MR BRAIN WO CONTRAST Result Date: 03/05/2024 CLINICAL DATA:  Follow-up examination for stroke. EXAM: MRI HEAD WITHOUT CONTRAST TECHNIQUE: Multiplanar, multiecho pulse sequences of the brain and surrounding structures were obtained without intravenous contrast. COMPARISON:  Comparison made with CT from 08/10/2022. FINDINGS: Brain: Generalized age-related cerebral atrophy. Patchy and confluent T2/FLAIR hyperintensity involving the periventricular and deep white matter both cerebral hemispheres, consistent with chronic small vessel ischemic disease, moderate in nature. Small remote left cerebellar infarct noted. Few small remote lacunar infarcts noted about the left basal ganglia. Focus of restricted diffusion seen involving the cortical aspect of the posterior left frontoparietal region, consistent with an acute left MCA distribution infarct (series 2, image 42). Minimal associated petechial blood products at this location without hemorrhagic transformation. No significant regional mass effect. Few additional small volume subcentimeter acute ischemic nonhemorrhagic infarcts seen involving the inferior left cerebellum (series 2, images 11, 10). No other evidence for acute or subacute ischemia. Gray-white matter differentiation otherwise maintained. No other areas of chronic cortical infarction. No other acute intracranial  hemorrhage. Few scattered chronic micro hemorrhages noted involving the supratentorial brain, likely hypertensive in nature. No mass lesion, midline shift or mass effect. No hydrocephalus or extra-axial fluid collection. Pituitary gland within normal limits. Vascular: Major intracranial vascular flow voids are maintained. Skull and upper cervical spine: Craniocervical junction within normal limits. Bone marrow signal intensity overall within normal limits. No scalp soft tissue abnormality. Sinuses/Orbits: Prior bilateral ocular  lens replacement. Paranasal sinuses are largely clear. No significant mastoid effusion. Other: None. IMPRESSION: 1. Small acute left MCA distribution infarct involving the posterior left frontoparietal region. Minimal associated petechial blood products without hemorrhagic transformation or significant regional mass effect. 2. Few additional subcentimeter acute ischemic nonhemorrhagic infarcts involving the inferior left cerebellum. 3. Underlying age-related cerebral atrophy with moderate chronic microvascular ischemic disease, with a few scattered chronic lacunar infarcts involving the left basal ganglia and left cerebellum. Electronically Signed   By: Morene Hoard M.D.   On: 03/05/2024 03:14   ECHOCARDIOGRAM LIMITED Result Date: 03/04/2024    ECHOCARDIOGRAM LIMITED REPORT   Patient Name:   KAVAUGHN FAUCETT Date of Exam: 03/04/2024 Medical Rec #:  [id number]      Height:       68.0 in Accession #:    [id number]     Weight:       185.0 lb Date of Birth:  11-Dec-1934       BSA:          1.977 m Patient Age:    89 years       BP:           68/49 mmHg Patient Gender: M              HR:           46 bpm. Exam Location:  Inpatient Procedure: Limited Echo, Cardiac Doppler and Color Doppler (Both Spectral and            Color Flow Doppler were utilized during procedure). STAT ECHO Indications:    chest pain status post TAVR  History:        Patient has prior history of Echocardiogram examinations, most                 recent 03/04/2024. CAD, COPD and chronic kidney disease; Risk                 Factors:Sleep Apnea, Hypertension and Diabetes.                 Aortic Valve: 26 mm Sapien prosthetic, stented (TAVR) valve is                 present in the aortic position. Procedure Date: 03/04/24.  Sonographer:    Tinnie Barefoot RDCS Referring Phys: [id number] KATHRYN R THOMPSON IMPRESSIONS  1. Small pericardial effusion is present that is new. Circumferential. No evidence of RA/RV collapse. No findings to suggest  tamponade. a small pericardial effusion is present. The pericardial effusion is circumferential. There is no evidence of cardiac  tamponade.  2. Left ventricular ejection fraction, by estimation, is 55 to 60%. The left ventricle has normal function.  3. Right ventricular systolic function is normal. The right ventricular size is normal.  4. The aortic valve has been repaired/replaced. Aortic valve regurgitation is not visualized. There is a 26 mm Sapien prosthetic (TAVR) valve present in the aortic position. Procedure Date: 03/04/24. Echo findings are consistent with normal structure and function of the aortic valve prosthesis. Aortic valve area, by VTI  measures 2.77 cm. Aortic valve mean gradient measures 2.0 mmHg. Aortic valve Vmax measures 1.03 m/s. FINDINGS  Left Ventricle: Left ventricular ejection fraction, by estimation, is 55 to 60%. The left ventricle has normal function. Right Ventricle: The right ventricular size is normal. No increase in right ventricular wall thickness. Right ventricular systolic function is normal. Pericardium: Small pericardial effusion is present that is new. Circumferential. No evidence of RA/RV collapse. No findings to suggest tamponade. A small pericardial effusion is present. The pericardial effusion is circumferential. There is no evidence of cardiac tamponade. Mitral Valve: MV peak gradient, 4.2 mmHg. The mean mitral valve gradient is 2.0 mmHg. Aortic Valve: The aortic valve has been repaired/replaced. Aortic valve regurgitation is not visualized. Aortic valve mean gradient measures 2.0 mmHg. Aortic valve peak gradient measures 4.2 mmHg. Aortic valve area, by VTI measures 2.77 cm. There is a 26 mm Sapien prosthetic, stented (TAVR) valve present in the aortic position. Procedure Date: 03/04/24. Echo findings are consistent with normal structure and function of the aortic valve prosthesis. Aorta: The aortic root and ascending aorta are structurally normal, with no evidence of  dilitation. Additional Comments: Spectral Doppler performed. Color Doppler performed.  LEFT VENTRICLE PLAX 2D LVOT diam:     2.10 cm LV SV:         68 LV SV Index:   35 LVOT Area:     3.46 cm  IVC IVC diam: 2.20 cm AORTIC VALVE AV Area (Vmax):    2.98 cm AV Area (Vmean):   3.02 cm AV Area (VTI):     2.77 cm AV Vmax:           103.00 cm/s AV Vmean:          65.200 cm/s AV VTI:            0.246 m AV Peak Grad:      4.2 mmHg AV Mean Grad:      2.0 mmHg LVOT Vmax:         88.60 cm/s LVOT Vmean:        56.800 cm/s LVOT VTI:          0.197 m LVOT/AV VTI ratio: 0.80  AORTA Ao Asc diam: 2.70 cm MITRAL VALVE MV Peak grad: 4.2 mmHg  SHUNTS MV Mean grad: 2.0 mmHg  Systemic VTI:  0.20 m MV Vmax:      1.03 m/s  Systemic Diam: 2.10 cm MV Vmean:     61.0 cm/s Darryle Decent MD Electronically signed by Darryle Decent MD Signature Date/Time: 03/04/2024/4:04:33 PM    Final    ECHOCARDIOGRAM LIMITED Result Date: 03/04/2024    ECHOCARDIOGRAM LIMITED REPORT   Patient Name:   KAELON WEEKES Date of Exam: 03/04/2024 Medical Rec #:  [id number]      Height:       68.0 in Accession #:    [id number]     Weight:       185.0 lb Date of Birth:  [birth year]       BSA:          1.977 m Patient Age:    89 years       BP:           190/86 mmHg Patient Gender: M              HR:           57 bpm. Exam Location:  Inpatient Procedure: Limited Echo, Cardiac Doppler and Color Doppler (Both Spectral and  Color Flow Doppler were utilized during procedure). Indications:     Aortic Stenosis i35.0  History:         Patient has prior history of Echocardiogram examinations, most                  recent 12/28/2023. CHF, CAD, COPD; Risk Factors:Hypertension,                  Dyslipidemia, Sleep Apnea and Diabetes.                  Aortic Valve: 26 mm Sapien prosthetic, stented (TAVR) valve is                  present in the aortic position. Procedure Date: 03/04/2024.  Sonographer:     Damien Senior RDCS Referring Phys:  [id number] LONNI JONETTA CASH  Diagnosing Phys: Darryle Decent MD  Sonographer Comments: 26mm Edwards [phone] TAVR Implanted IMPRESSIONS  1. Echo guided TAVR. Normal prothesis. No regurgitation or paravalvular leak. The aortic valve has been repaired/replaced. Aortic valve regurgitation is not visualized. There is a 26 mm Sapien prosthetic (TAVR) valve present in the aortic position. Procedure Date: 03/04/2024. Echo findings are consistent with normal structure and function of the aortic valve prosthesis. Aortic valve area, by VTI measures 2.57 cm. Aortic valve mean gradient measures 3.0 mmHg. Aortic valve Vmax measures 1.02 m/s.  2. Left ventricular ejection fraction, by estimation, is 50 to 55%. The left ventricle has low normal function.  3. Right ventricular systolic function is normal. The right ventricular size is normal. FINDINGS  Left Ventricle: Left ventricular ejection fraction, by estimation, is 50 to 55%. The left ventricle has low normal function. Right Ventricle: The right ventricular size is normal. No increase in right ventricular wall thickness. Right ventricular systolic function is normal. Pericardium: Trivial pericardial effusion is present. Presence of epicardial fat layer. Mitral Valve: MV peak gradient, 7.4 mmHg. The mean mitral valve gradient is 4.0 mmHg. Tricuspid Valve: The tricuspid valve is grossly normal. Tricuspid valve regurgitation is trivial. No evidence of tricuspid stenosis. Aortic Valve: Echo guided TAVR. Normal prothesis. No regurgitation or paravalvular leak. The aortic valve has been repaired/replaced. Aortic valve regurgitation is not visualized. Aortic valve mean gradient measures 3.0 mmHg. Aortic valve peak gradient measures 4.2 mmHg. Aortic valve area, by VTI measures 2.57 cm. There is a 26 mm Sapien prosthetic, stented (TAVR) valve present in the aortic position. Procedure Date: 03/04/2024. Echo findings are consistent with normal structure and function of the aortic valve prosthesis. Additional Comments:  Spectral Doppler performed. Color Doppler performed.  LEFT VENTRICLE PLAX 2D LVOT diam:     2.10 cm LV SV:         68 LV SV Index:   35 LVOT Area:     3.46 cm  AORTIC VALVE AV Area (Vmax):    2.49 cm AV Area (Vmean):   2.50 cm AV Area (VTI):     2.57 cm AV Vmax:           102.00 cm/s AV Vmean:          77.100 cm/s AV VTI:            0.265 m AV Peak Grad:      4.2 mmHg AV Mean Grad:      3.0 mmHg LVOT Vmax:         73.40 cm/s LVOT Vmean:        55.700 cm/s LVOT VTI:  0.197 m LVOT/AV VTI ratio: 0.74 MITRAL VALVE MV Peak grad: 7.4 mmHg  SHUNTS MV Mean grad: 4.0 mmHg  Systemic VTI:  0.20 m MV Vmax:      1.36 m/s  Systemic Diam: 2.10 cm MV Vmean:     98.7 cm/s Darryle Decent MD Electronically signed by Darryle Decent MD Signature Date/Time: 03/04/2024/12:41:51 PM    Final    Structural Heart Procedure Result Date: 03/04/2024 See surgical note for result.  DG Chest 2 View Result Date: 02/29/2024 CLINICAL DATA:  Preop exam.  Aortic stenosis, severe. EXAM: CHEST - 2 VIEW COMPARISON:  CT 01/30/2024 FINDINGS: The cardiomediastinal contours are normal. Aortic atherosclerosis. Pulmonary vasculature is normal. No consolidation, pleural effusion, or pneumothorax. No acute osseous abnormalities are seen. Thoracic spondylosis. IMPRESSION: No active cardiopulmonary disease. Electronically Signed   By: Andrea Gasman M.D.   On: 02/29/2024 12:59     PHYSICAL EXAM  Temp:  [96.7 F (35.9 C)-98.7 F (37.1 C)] 96.7 F (35.9 C) (11/26 1100) Pulse Rate:  [52-88] 77 (11/26 1500) Resp:  [10-24] 20 (11/26 1500) BP: (76-187)/(52-113) 132/56 (11/26 1500) SpO2:  [88 %-100 %] 91 % (11/26 1500) Weight:  [88.5 kg] 88.5 kg (11/26 0410)  General - Well nourished, well developed, in no apparent distress.  Ophthalmologic - fundi not visualized due to noncooperation.  Cardiovascular - Regular rhythm and rate.  Neuro - awake, alert, eyes open, orientated to age, place, time and people. No aphasia, fluent language,  following all simple commands. Able to name and repeat and read. No gaze palsy, tracking bilaterally, visual field full, PERRL. No facial droop. Tongue midline Bilateral UEs 5/5, no drift. Bilaterally LEs 5/5, no drift. Sensation symmetrical bilaterally, b/l FTN intact, gait not tested.     ASSESSMENT/PLAN Mr. SOLOMON SKOWRONEK is a 89 y.o. male with history of aortic stenosis, CHF, hypertension, hyperlipidemia, diabetes, CAD status post stenting 2012, OSA, CKD, gout, former smoker, essential tremor on primidone  admitted for TAVR procedure.  Reported right hand weakness and numbness postprocedure no TNK given due to symptom resolved.    Stroke:  left cerebellum, MCA and MCA/ACA infarcts embolic likely secondary to recent procedure MRI  Small acute left MCA distribution infarct involving the posterior left frontoparietal region. Few additional subcentimeter acute ischemic infarcts involving the inferior left cerebellum. MRA normal Carotid Doppler unremarkable 2D Echo EF 60 to 65%, status post TAVR LDL 15 HgbA1c pending SCDs for VTE prophylaxis aspirin  81 mg daily and clopidogrel  75 mg daily prior to admission, now on home aspirin  81 mg daily and clopidogrel  75 mg daily.  Patient counseled to be compliant with his antithrombotic medications Ongoing aggressive stroke risk factor management Therapy recommendations: None Disposition: Home  Diabetes HgbA1c pending goal < 7.0 Controlled CBG monitoring SSI DM education and close PCP follow up  Hypertension Stable Await low BP Long term BP goal normotensive  Hyperlipidemia Home meds: Lipitor  80 LDL 15, goal < 70 Now on Lipitor  80 Continue statin at discharge  Other Stroke Risk Factors Advanced age Coronary artery disease status post stent 2012 Obstructive sleep apnea Former smoker  Other Active Problems Gout Essential tremor on primidone  CKD 4, creatinine 2.64 Mild thrombocytopenia platelet 122 AAS status post TAVR  Hospital  day # 1  Neurology will sign off. Please call with questions. Pt will follow up with stroke clinic NP at [facility] in about 4 weeks. Thanks for the consult.   Ary Cummins, MD PhD Stroke Neurology 03/05/2024 5:07 PM    To contact Stroke  Continuity provider, please refer to [email or web address]. After hours, contact General Neurology

## 2024-03-05 NOTE — Progress Notes (Signed)
 Carotid duplex has been completed.   Results can be found under chart review under CV PROC. 03/05/2024 3:31 PM Sammye Staff RVT, RDMS

## 2024-03-05 NOTE — Progress Notes (Signed)
 Stroke Expertise provided to patient. Resource guide reviewed with nurse for applicable diagnosis. Appropriate stroke care and orders confirmed in place.  For any questions related to patient's stroke care, please call:  Stroke Response Nurse (Monday - Friday 0700-1900): [phone]  4 [phone] Nurse (Nights and Weekends): [phone]

## 2024-03-05 NOTE — Evaluation (Signed)
 Physical Therapy Evaluation Patient Details Name: Marco Acevedo MRN: [id number] DOB: Feb 24, 1935 Today's Date: 03/05/2024  History of Present Illness  Patient is an 89 y/o male admitted 03/04/24 for TAVR due to severe aortic stenosis.  Developed hypotension post procedure and transferred to ICU for pressor initiation.  Developed R hand clumsiness and found to have small L MCA infarct post L frontoparietal and some additional subcentimeter acute infarcts L inferior cerebellum.  PMH positive for CHF, L MCA CVA, CAD, COPD, HTN, HLD, OSA, DM II, arthritis, anemia, CKD IV.  Clinical Impression  Patient presents with mobility close to functional baseline.  Able to ambulation without device in hallway and turn and stop and walk backwards without LOB.  Seems to have set up at home for easy and safe access with single level, no steps, walk in shower with bars and seat.  Wife is older than him so he usually does cooking.  Feel stable for home with family support and no follow up PT.  Stroke and fall education completed.         If plan is discharge home, recommend the following: Help with stairs or ramp for entrance   Can travel by private vehicle        Equipment Recommendations None recommended by PT  Recommendations for Other Services       Functional Status Assessment Patient has not had a recent decline in their functional status     Precautions / Restrictions Precautions Precautions: Fall      Mobility  Bed Mobility               General bed mobility comments: in recliner    Transfers Overall transfer level: Needs assistance Equipment used: None Transfers: Sit to/from Stand Sit to Stand: Supervision           General transfer comment: for lines/safety    Ambulation/Gait Ambulation/Gait assistance: Contact guard assist, Supervision Gait Distance (Feet): 400 Feet Assistive device: None Gait Pattern/deviations: Step-through pattern, Decreased stride length,  Shuffle       General Gait Details: mild decr step height though no LOB and no issues with turn and stop and backward walking  Stairs            Wheelchair Mobility     Tilt Bed    Modified Rankin (Stroke Patients Only)       Balance Overall balance assessment: Needs assistance   Sitting balance-Leahy Scale: Good       Standing balance-Leahy Scale: Good                 High Level Balance Comments: eyes closed 10 sec with S; balance for step taps forward with CGA for safety             Pertinent Vitals/Pain Pain Assessment Pain Assessment: No/denies pain    Home Living Family/patient expects to be discharged to:: Private residence Living Arrangements: Spouse/significant other Available Help at Discharge: Family Type of Home:  (CONDO) Home Access: Level entry       Home Layout: One level Home Equipment: Grab bars - tub/shower;Educational Psychologist (4 wheels)      Prior Function Prior Level of Function : Independent/Modified Independent             Mobility Comments: golfing twice a week ADLs Comments: drives, cooks, does laundry, pays the bills, manages medicine     Extremity/Trunk Assessment   Upper Extremity Assessment Upper Extremity Assessment: Right hand dominant;RUE deficits/detail RUE Deficits / Details: trigger  finger ring finger, difficulty with fine motor though seems baseline; pron/sup and FNF intact    Lower Extremity Assessment Lower Extremity Assessment: Overall WFL for tasks assessed    Cervical / Trunk Assessment Cervical / Trunk Assessment: Kyphotic  Communication   Communication Communication: No apparent difficulties    Cognition Arousal: Alert Behavior During Therapy: WFL for tasks assessed/performed   PT - Cognitive impairments: No apparent impairments                         Following commands: Intact       Cueing       General Comments General comments (skin integrity, edema, etc.):  son arrived during session and supportive; educated on fall prevention and BE FAST for stroke warning signs and importance to get care quickly; brief drop in SpO2 84% after ambulation back to 92% with <1 min pursed lip breathing; BP 198/68 after ambulation, RN aware    Exercises     Assessment/Plan    PT Assessment Patient does not need any further PT services  PT Problem List         PT Treatment Interventions      PT Goals (Current goals can be found in the Care Plan section)  Acute Rehab PT Goals PT Goal Formulation: All assessment and education complete, DC therapy    Frequency       Co-evaluation               AM-PAC PT 6 Clicks Mobility  Outcome Measure Help needed turning from your back to your side while in a flat bed without using bedrails?: None Help needed moving from lying on your back to sitting on the side of a flat bed without using bedrails?: None Help needed moving to and from a bed to a chair (including a wheelchair)?: None Help needed standing up from a chair using your arms (e.g., wheelchair or bedside chair)?: None Help needed to walk in hospital room?: None Help needed climbing 3-5 steps with a railing? : None 6 Click Score: 24    End of Session Equipment Utilized During Treatment: Gait belt Activity Tolerance: Patient tolerated treatment well Patient left: in chair;with call bell/phone within reach;with family/visitor present   PT Visit Diagnosis: Other symptoms and signs involving the nervous system (R29.898)    Time: 8779-8744 PT Time Calculation (min) (ACUTE ONLY): 35 min   Charges:   PT Evaluation $PT Eval Low Complexity: 1 Low PT Treatments $Self Care/Home Management: 8-22 PT General Charges $$ ACUTE PT VISIT: 1 Visit         Micheline Portal, PT Acute Rehabilitation Services Office:[phone] 03/05/2024   Montie Portal 03/05/2024, 1:38 PM

## 2024-03-05 NOTE — TOC Transition Note (Signed)
 Transition of Care [facility]) - Discharge Note   Patient Details  Name: Marco Acevedo MRN: [id number] Date of Birth: 04-09-1935  [facility]) CM/SW Contact:  Arlana JINNY Nicholaus [place] Phone Number: [phone] 03/05/2024, 12:17 PM   Clinical Narrative:   HF CSW attempted to contact the patients PCP to schedule a hospital follow up appointment. It appears the office may be closed for the holidays. CSW notified the patient and family that they will need to schedule follow up.   Patients son will provide transportation home.           Patient Goals and CMS Choice            Discharge Placement                       Discharge Plan and Services Additional resources added to the After Visit Summary for                                       Social Drivers of Health (SDOH) Interventions SDOH Screenings   Food Insecurity: No Food Insecurity (08/10/2022)  Housing: Low Risk  (08/10/2022)  Transportation Needs: No Transportation Needs (08/10/2022)  Utilities: Not At Risk (08/10/2022)  Tobacco Use: Medium Risk (03/04/2024)     Readmission Risk Interventions     No data to display

## 2024-03-05 NOTE — Progress Notes (Signed)
 Discussed with pt and son restrictions, exercise guidelines, and CRPII. He is not interested in CRPII as he likes to exercise on his own at the gym.  [id number] Aliene Aris BS, ACSM-CEP 03/05/2024 1:39 PM

## 2024-03-05 NOTE — Progress Notes (Signed)
 HEART AND VASCULAR CENTER   MULTIDISCIPLINARY HEART VALVE TEAM  Patient Name: Marco Acevedo Date of Encounter: 03/05/2024  Admit date: 03/04/2024   PCP:  Okey Carlin Redbird, MD  [facility] Cardiologist:  Oneil Parchment, MD  [facility] Structural heart: Lonni Cash, MD [facility] Electrophysiologist:  None   Hospital Problem List     Principal Problem:   S/P TAVR (transcatheter aortic valve replacement) Active Problems:   COPD (chronic obstructive pulmonary disease) (HCC)   OSA (obstructive sleep apnea)   CKD (chronic kidney disease) stage 4, GFR 15-29 ml/min (HCC)   Essential hypertension   Anemia   Benign prostatic hyperplasia without lower urinary tract symptoms   Carotid artery disease   Carpal tunnel syndrome   Diabetic peripheral neuropathy associated with type 2 diabetes mellitus (HCC)   DMII (diabetes mellitus, type 2) (HCC)   CAD S/P percutaneous coronary angioplasty   Severe aortic stenosis     Subjective   Sitting up in bed with son at bedside. Feeling a lot better. Arm sx have resolved.   Inpatient Medications    Scheduled Meds:  allopurinol   100 mg Oral Daily   amLODipine   2.5 mg Oral Daily   aspirin  EC  81 mg Oral Daily   atorvastatin   80 mg Oral q1800   carvedilol   6.25 mg Oral BID WC   clopidogrel   75 mg Oral Daily   dapagliflozin  propanediol  10 mg Oral Daily   docusate sodium   100 mg Oral QPM   ferrous sulfate   325 mg Oral Daily   finasteride   5 mg Oral Daily   gabapentin   600 mg Oral QHS   guaiFENesin   400 mg Oral BID   insulin  aspart  0-24 Units Subcutaneous TID AC & HS   isosorbide  mononitrate  120 mg Oral Daily   pantoprazole   40 mg Oral Daily   primidone   50 mg Oral QHS   sodium chloride  flush  3 mL Intravenous Q12H   Continuous Infusions:  sodium chloride      magnesium  sulfate bolus IVPB 2 g (03/05/24 0909)   norepinephrine  (LEVOPHED ) Adult infusion Stopped (03/04/24 2157)   PRN Meds: sodium chloride ,  acetaminophen  **OR** acetaminophen , morphine  injection, ondansetron  (ZOFRAN ) IV, mouth rinse, oxyCODONE , sodium chloride  flush, traMADol    Vital Signs    Vitals:   03/05/24 0615 03/05/24 0630 03/05/24 0645 03/05/24 0700  BP: (!) 169/68 (!) 180/75 (!) 181/74 (!) 183/74  Pulse: 80 82 82 86  Resp: 18 18 18  (!) 23  Temp:    98.7 F (37.1 C)  TempSrc:    Axillary  SpO2: 99% 96% 97% 98%  Weight:      Height:        Intake/Output Summary (Last 24 hours) at 03/05/2024 0916 Last data filed at 03/05/2024 0655 Gross per 24 hour  Intake 1774.94 ml  Output 1110 ml  Net 664.94 ml   Filed Weights   03/03/24 1200 03/04/24 0905 03/05/24 0410  Weight: 85.3 kg 83.9 kg 88.5 kg    Physical Exam    GEN: Well nourished, well developed, in no acute distress.  HEENT: Grossly normal.  Neck: Supple, no JVD or masses. Cardiac: RRR, no murmurs, rubs, or gallops. No clubbing, cyanosis, edema.   Respiratory:  Respirations regular and unlabored, clear to auscultation bilaterally. GI: Soft, nontender, nondistended, BS + x 4. MS: no deformity or atrophy. Skin: warm and dry, no rash.  Groin sites clear without hematoma or ecchymosis  Neuro:  Strength and sensation are  intact. Psych: AAOx3.  Normal affect.  Labs    CBC Recent Labs    03/04/24 1641 03/05/24 0553  WBC  --  7.2  HGB 11.3* 12.0*  HCT 35.1* 37.0*  MCV  --  90.5  PLT  --  122*   Basic Metabolic Panel: Recent Labs  Lab 02/29/24 0950 03/04/24 1258 03/05/24 0553  NA 137 141 139  K 4.3 4.6 4.2  CL 104 106 106  CO2 26  --  23  GLUCOSE 160* 199* 133*  BUN 46* 40* 38*  CREATININE 2.77* 2.60* 2.64*  CALCIUM  8.9  --  8.7*  MG  --   --  1.6*   GFR: Estimated Creatinine Clearance: 21.3 mL/min (A) (by C-G formula based on SCr of 2.64 mg/dL (H)). Recent Labs  Lab 02/29/24 0950 03/05/24 0553  WBC 5.0 7.2    Telemetry    Sinus HRs 80s  - Personally Reviewed  ECG    Sinus HR 67, narrow QRS - Personally Reviewed  Patient  Profile     Marco Acevedo is a 89 y.o. male with a history of anemia, DMT2, COPD, PAD on imaging, chronic diastolic CHF, CAD, obesity, CKD stage IV, HTN, HLD, previous stroke, sleep apnea and severe aortic stenosis who presented to [facility] on 03/04[facility]24 for planned TAVR.   Assessment & Plan    Severe AS:  -- S/p successful TAVR with a 26 mm Edwards Sapien 3 Ultra Resilia THV via the TF approach on 03/04/24.  -- Post operative echo completed but pending formal read. -- Groin sites are stable.  -- Continue home Asprin 81mg  daily and Plavix  75mg  daily.  -- Met with cardiac rehab to discuss CRP phase II.  -- Plan to make sure he can ambulate without issues, Bp well controlled and post op echo with stable effusion. Hopeful discharge later this afternoon.    Hypotension: -- Developed profound hypotension in the setting of bradycardia. This improved with volume resuscitation and Levophed . -- Limited echo showed small new circumferential pericardial effusion w/o tamponade.  -- This has now resolved.    Acute CVA:  -- Pt had right arm heaviness and MRI showed small acute L MCA infarct.  -- Sx resolved.  -- Appreciate neuro assistance.     New LBBB: -- Pt had new LBBB after TAVR with bradycardia associated with hypotension.  -- ECG today with NSR and narrow QRS. No further bradycardia on tele.    CAD: -- Cardiac cath 01/07/24 with patent Circumflex stents with non-obstructive disease noted in the LAD, Circumflex and RCA. -- Continue medical therapy.    Chronic HFpEF: -- LVEDP 15 mm hg at the time of TAVR.  -- Continue Jardiance 10mg  daily.    HTN: -- BP elevated now.  -- Resume home meds including amlodipine  2.5mg  daily, carvedilol  6.25 mg BID and Imdur  120 mg daily.    CKD stage IV: -- Creat stable at 2.64.  -- Followed by Dr. Norine.   SignedLamarr Hummer, PA-C  03/05/2024, 9:16 AM  Pager [phone]

## 2024-03-05 NOTE — Plan of Care (Signed)

## 2024-03-05 NOTE — Code Documentation (Signed)
 Late entry   Stroke Response Nurse Documentation Code Documentation  Patient underwent TAVR today, in which he required transfer to ICU for pressor management. LKW approximately 0900 prior to procedure. NIH 0. Pt not candidate for TNK due OOW and TAVR. LVO not suspected. MRI tonight, NPO until swallow screen completed. Bedside handoff with RN Nat.    Tonna Lacks K  Rapid Response RN

## 2024-03-05 NOTE — TOC Initial Note (Signed)
 Transition of Care [facility]) - Initial/Assessment Note    Patient Details  Name: Marco Acevedo MRN: [id number] Date of Birth: April 30, 1934  Transition of Care [facility]) CM/SW Contact:    Arlana JINNY Nicholaus [place] Phone Number: [phone] 03/05/2024, 11:50 AM  Clinical Narrative:    HF CSW met with patient and son at bedside. Patient stated that he lives at home with his wife. Patient stated that he has history of HH services, but not current. Patient stated that he uses a walker on a as needed basis. Patient stated that he has a scale at home. Patient stated that he has a PCP. CSW explained that a hospital follow up appointment is typically scheduled closer towards dc. Patient agreed, and requested morning appointments on any day but Monday and Thursdays. Patients family will provide transportation at dc.   HF CSW/CM will continue to follow and monitor for dc readiness.                      Patient Goals and CMS Choice            Expected Discharge Plan and Services                                              Prior Living Arrangements/Services                       Activities of Daily Living   ADL Screening (condition at time of admission) Independently performs ADLs?: Yes (appropriate for developmental age) Is the patient deaf or have difficulty hearing?: Yes Does the patient have difficulty seeing, even when wearing glasses/contacts?: No Does the patient have difficulty concentrating, remembering, or making decisions?: No  Permission Sought/Granted                  Emotional Assessment              Admission diagnosis:  Aortic stenosis, severe [I35.0] S/P TAVR (transcatheter aortic valve replacement) [Z95.2] Patient Active Problem List   Diagnosis Date Noted   S/P TAVR (transcatheter aortic valve replacement) 03/04/2024   Severe aortic stenosis    Orthostatic hypotension 03/28/2023   Other general symptoms and signs 03/28/2023   ABLA  (acute blood loss anemia) 08/16/2022   Other fracture of left femur, initial encounter for closed fracture (HCC) 08/16/2022   Left displaced femoral neck fracture (HCC) 08/10/2022   CAD S/P percutaneous coronary angioplasty 08/10/2022   Hip fracture (HCC) 08/10/2022   Abnormal involuntary movements 09/01/2020   After-cataract with vision obscured 09/01/2020   Anemia 09/01/2020   Benign neoplasm of colon 09/01/2020   Benign prostatic hyperplasia without lower urinary tract symptoms 09/01/2020   Calculus of kidney 09/01/2020   Carotid artery disease 09/01/2020   Carpal tunnel syndrome 09/01/2020   Chronic ischemic heart disease 09/01/2020   Constipation 09/01/2020   Diverticulitis 09/01/2020   Elevated prostate specific antigen (PSA) 09/01/2020   Gastro-esophageal reflux disease without esophagitis 09/01/2020   Gout 09/01/2020   Hemiplegia as late effect of cerebrovascular disease (HCC) 09/01/2020   History of other malignant neoplasm of skin 09/01/2020   Hyperpotassemia 09/01/2020   Left inguinal pain 09/01/2020   Mild nonproliferative diabetic retinopathy (HCC) 09/01/2020   Other B-complex deficiencies 09/01/2020   Other long term (current) drug therapy 09/01/2020   Encounter for immunization 09/01/2020  Pain in joint, shoulder region 09/01/2020   Peripheral neuropathy 09/01/2020   Refractive amblyopia 09/01/2020   Sinus bradycardia 09/01/2020   Personal history of tobacco use, presenting hazards to health 09/01/2020   Tremor 09/01/2020   Diabetic peripheral neuropathy associated with type 2 diabetes mellitus (HCC) 09/01/2020   DMII (diabetes mellitus, type 2) (HCC) 09/01/2020   Pain due to onychomycosis of toenail of left foot 06/04/2020   Unstable angina (HCC) 02/24/2019   Right sided weakness    Hx of colonic polyps 03/22/2016   Status post surgery 09/23/2014   Gallstones 09/23/2014   Parotid mass    Essential hypertension 05/29/2013   CKD (chronic kidney disease) stage  4, GFR 15-29 ml/min (HCC) 09/13/2012    Class: Chronic   Coronary artery disease involving native coronary artery of native heart with angina pectoris (HCC) 09/12/2012    Class: Acute   Pure hypercholesterolemia 09/01/2012   URI (upper respiratory infection) 03/05/2012   Dermatochalasis 01/30/2012   Conjunctivochalasis 01/30/2012   COPD (chronic obstructive pulmonary disease) (HCC) 05/11/2011   OSA (obstructive sleep apnea) 05/11/2011   PCP:  Okey Carlin Redbird, MD Pharmacy:   [facility] [address], [place] - [address] [place] [address] Phone: [phone] Fax: [phone]  [facility] - [place], [place] - [address] [place] [address] Phone: [phone] Fax: [phone]  [facility] - [place], [place] - [address] [place] [address] Phone: [phone] Fax: [phone]     Social Drivers of Health (SDOH) Social History: SDOH Screenings   Food Insecurity: No Food Insecurity (08/10/2022)  Housing: Low Risk  (08/10/2022)  Transportation Needs: No Transportation Needs (08/10/2022)  Utilities: Not At Risk (08/10/2022)  Tobacco Use: Medium Risk (03/04/2024)   SDOH Interventions:     Readmission Risk Interventions     No data to display

## 2024-03-05 NOTE — Progress Notes (Signed)
 03/05/2024 Called to room for hand weakness last night, code stroke called, discussed with neuro.  MRI confirms small L MCA stroke.  Defer BP goals and further workup to stroke team.  Soft pressures have resolved, his R hand weakness has also improved.  Discussed with TAVR team, available as needed.  Rolan Sharps MD PCCM

## 2024-03-05 NOTE — Evaluation (Signed)
 Occupational Therapy Evaluation Patient Details Name: Marco Acevedo MRN: [id number] DOB: Oct 16, 1934 Today's Date: 03/05/2024   History of Present Illness   Patient is an 89 y/o male admitted 03/04/24 for TAVR due to severe aortic stenosis.  Developed hypotension post procedure and transferred to ICU for pressor initiation.  Developed R hand clumsiness and found to have small L MCA infarct post L frontoparietal and some additional subcentimeter acute infarcts L inferior cerebellum.  PMH positive for CHF, L MCA CVA, CAD, COPD, HTN, HLD, OSA, DM II, arthritis, anemia, CKD IV.     Clinical Impressions Pt ind and active at baseline with ADLs/functional mobility, lives with spouse. Pt at baseline with ADLs and mobilizing without assist at this time. Mild coordination deficits in RUE, but is baseline due to trigger finger in 4th digit. Pt presenting with impairments listed below, however has no further acute OT needs at this time, will s/o. Please reconsult if there is a change in pt status. Anticipate no OT follow up needs at d/c.     If plan is discharge home, recommend the following:   Assist for transportation;Assistance with cooking/housework     Functional Status Assessment   Patient has had a recent decline in their functional status and demonstrates the ability to make significant improvements in function in a reasonable and predictable amount of time.     Equipment Recommendations   None recommended by OT     Recommendations for Other Services         Precautions/Restrictions   Precautions Precautions: Fall Restrictions Weight Bearing Restrictions Per Provider Order: No     Mobility Bed Mobility               General bed mobility comments: in recliner    Transfers Overall transfer level: Needs assistance Equipment used: None Transfers: Sit to/from Stand Sit to Stand: Supervision                  Balance Overall balance assessment: No  apparent balance deficits (not formally assessed)                                         ADL either performed or assessed with clinical judgement   ADL Overall ADL's : At baseline                                             Vision   Vision Assessment?: No apparent visual deficits     Perception Perception: Not tested       Praxis Praxis: Not tested       Pertinent Vitals/Pain Pain Assessment Pain Assessment: No/denies pain     Extremity/Trunk Assessment Upper Extremity Assessment Upper Extremity Assessment: Right hand dominant RUE Deficits / Details: trigger finger ring finger, difficulty with fine motor though seems baseline; pron/sup and FNF intact   Lower Extremity Assessment Lower Extremity Assessment: Overall WFL for tasks assessed   Cervical / Trunk Assessment Cervical / Trunk Assessment: Normal   Communication Communication Communication: No apparent difficulties   Cognition Arousal: Alert Behavior During Therapy: WFL for tasks assessed/performed               OT - Cognition Comments: mild STM deficits however, pt reports is baseline  Following commands: Intact       Cueing  General Comments      VSS on RA   Exercises     Shoulder Instructions      Home Living Family/patient expects to be discharged to:: Private residence Living Arrangements: Spouse/significant other Available Help at Discharge: Family Type of Home: House Home Access: Level entry     Home Layout: One level     Bathroom Shower/Tub: Producer, Television/film/video: Handicapped height     Home Equipment: Grab bars - tub/shower;Educational Psychologist (4 wheels)          Prior Functioning/Environment Prior Level of Function : Independent/Modified Independent             Mobility Comments: golfing twice a week ADLs Comments: drives, cooks, does laundry, pays the bills, manages medicine    OT  Problem List: Decreased coordination;Decreased cognition   OT Treatment/Interventions:        OT Goals(Current goals can be found in the care plan section)   Acute Rehab OT Goals Patient Stated Goal: none stated OT Goal Formulation: With patient Time For Goal Achievement: 03/19/24 Potential to Achieve Goals: Good   OT Frequency:       Co-evaluation              AM-PAC OT 6 Clicks Daily Activity     Outcome Measure Help from another person eating meals?: None Help from another person taking care of personal grooming?: None Help from another person toileting, which includes using toliet, bedpan, or urinal?: None Help from another person bathing (including washing, rinsing, drying)?: None Help from another person to put on and taking off regular upper body clothing?: None Help from another person to put on and taking off regular lower body clothing?: None 6 Click Score: 24   End of Session Nurse Communication: Mobility status  Activity Tolerance: Patient tolerated treatment well Patient left: in chair;with call bell/phone within reach;with nursing/sitter in room;with family/visitor present  OT Visit Diagnosis: Muscle weakness (generalized) (M62.81)                Time: 1349-1410 OT Time Calculation (min): 21 min Charges:  OT General Charges $OT Visit: 1 Visit OT Evaluation $OT Eval Low Complexity: 1 Low   Leahann Lempke K, OTD, OTR/L SecureChat Preferred Acute Rehab [phone]   Rigley Niess K Koonce 03/05/2024, 2:16 PM

## 2024-03-08 ENCOUNTER — Encounter: Payer: Self-pay | Admitting: Cardiovascular Disease

## 2024-03-10 ENCOUNTER — Telehealth: Payer: Self-pay

## 2024-03-10 DIAGNOSIS — I350 Nonrheumatic aortic (valve) stenosis: Secondary | ICD-10-CM

## 2024-03-10 NOTE — Transitions of Care (Post Inpatient/ED Visit) (Signed)
 Stroke Discharge Follow-up   03/10/2024 Name:  MARTICE DOTY MRN:  [id number] DOB:  [birth year]  Subjective: KEMAL AMORES is a 89 y.o. year old male who is a primary care patient of Okey Carlin Redbird, MD An Emmi alert was received indicating patient responded to questions: Know how/when to take meds? Scheduled a follow-up appointment?. I reached out by phone to follow up on the alert and spoke to Patient.  Patient reports that he  has his follow up scheduled with PCP in 2 days. Reports that he has all his medications and is taking them as prescribed. Denies any concerns today. Request to removed from the automated calls. Message sent to Leita Lyme to deactivate.   Care Management Interventions: Reviewed medications. Reviewed follow up appointments.   Follow up plan: No further intervention required.  Alan Ee, RN, BSN, CEN [employer]- Transition of Care Team.  [facility] [phone]

## 2024-03-12 NOTE — Progress Notes (Unsigned)
 Structural Heart Clinic Note  No chief complaint on file.  History of Present Illness: 89 yo male with history of anemia, DM, COPD, chronic diastolic CHF, CAD, CKD stage IV, HTN, HLD, previous stroke, carotid artery disease, sleep apnea and severe aortic stenosis now s/p TAVR who is here today for one week post TAVR follow up. Echo 12/28/23 with LVEF=65=70%. Normal RV function. Moderately severe aortic stenosis with mean gradient 36.5 mmHg, AVA 1.0 cm2, SVI 51, DI 0.21. Cardiac cath 01/07/24 with patent Circumflex stents with non-obstructive disease noted in the LAD, Circumflex and RCA. Severe aortic stenosis with mean gradient 37 mmHg, AVA 0.78 cm2. He underwent TAVR on 03/04/24 with placement of a 26 mm Edwards Sapien 3 Ultra Resilia THV from the femoral approach. Following his procedure he became hypotensive on the floor requiring Levophed  infusion and transfer to the ICU. Echo with small pericardial effusion. He had right arm heaviness and tingling in his hand. Brain MRI showed small acute left MCA infarct. His blood pressure improved and Levophed  was stopped. He was seen by Neurology with recommendations for ASA and statin. His neuro symptoms resolved. Echo on 03/05/24 with normal LV systolic function. Small pericardial effusion. Normally functioning AVR with no PVL.   He is here today for follow up. The patient denies any chest pain, dyspnea, palpitations, lower extremity edema, orthopnea, PND, dizziness, near syncope or syncope.   Primary Care Physician: Okey Carlin Redbird, MD Primary Cardiologist: Jeffrie  Past Medical History:  Diagnosis Date   Anemia    low iron    Aortic stenosis    Arthritis    BPH (benign prostatic hyperplasia)    CAD (coronary artery disease)    Chronic diastolic CHF (congestive heart failure) (HCC)    CKD (chronic kidney disease), stage IV (HCC)    Claustrophobia    Complication of anesthesia    has to have head elevated to keep from strangling on post nasal  drip   COPD (chronic obstructive pulmonary disease) (HCC)    Diabetes mellitus type II    type 2   GERD (gastroesophageal reflux disease)    Gout    Granuloma annulare    History of hiatal hernia    History of kidney stones    Litthotrispy   Hyperlipidemia    Hypertension    Neuropathy    Obesity    OSA (obstructive sleep apnea)    S/P TAVR (transcatheter aortic valve replacement) 03/04/2024   s/p TAVR with a 26 mm Edwards Sapien 3 Ultra Resilia THV via the TF approach by Dr. Verlin and Dr. Shyrl   Stroke [facility]) 10/2017   Weakness right arm and leg    Past Surgical History:  Procedure Laterality Date   BLEPHAROPLASTY     CARDIAC CATHETERIZATION  [id number]   X 2 stents   CHOLECYSTECTOMY N/A 09/23/2014   Procedure: LAPAROSCOPIC CHOLECYSTECTOMY WITH INTRAOPERATIVE CHOLANGIOGRAM;  Surgeon: Deward Null [name], MD;  Location: [facility] OR;  Service: General;  Laterality: N/A;   COLONOSCOPY     COLONOSCOPY WITH PROPOFOL  N/A 03/22/2016   Procedure: COLONOSCOPY WITH PROPOFOL ;  Surgeon: Jerrell Sol, MD;  Location: [facility] ENDOSCOPY;  Service: Endoscopy;  Laterality: N/A;   ENDARTERECTOMY Left 10/19/2017   Procedure: ENDARTERECTOMY CAROTID LEFT;  Surgeon: Oris Krystal FALCON, MD;  [facility] OR;  Service: Vascular;  Laterality: Left;   ESOPHAGOGASTRODUODENOSCOPY (EGD) WITH PROPOFOL  N/A 03/22/2016   Procedure: ESOPHAGOGASTRODUODENOSCOPY (EGD) WITH PROPOFOL ;  Surgeon: Jerrell Sol, MD;  Location: [facility] ENDOSCOPY;  Service: Endoscopy;  Laterality: N/A;  EYE SURGERY     both cataracts   INTRAMEDULLARY (IM) NAIL INTERTROCHANTERIC Left 08/11/2022   Procedure: INTRAMEDULLARY (IM) NAIL INTERTROCHANTERIC;  Surgeon: Edna Toribio LABOR, MD;  Location: [facility] OR;  Service: Orthopedics;  Laterality: Left;   INTRAOPERATIVE TRANSTHORACIC ECHOCARDIOGRAM N/A 03/04/2024   Procedure: ECHOCARDIOGRAM, TRANSTHORACIC;  Surgeon: Verlin Lonni BIRCH, MD;  Location: [facility] INVASIVE CV LAB;  Service: Cardiovascular;  Laterality:  N/A;   KNEE ARTHROSCOPY WITH MEDIAL MENISECTOMY Right 08/19/2013   Procedure: RIGHT KNEE ARTHROSCOPY WITH PARTIAL MEDIAL MENISECTOMY AND CHONDROPLASTY;  Surgeon: Maude KANDICE Herald, MD;  Location: [facility];  Service: Orthopedics;  Laterality: Right;   LEFT HEART CATH AND CORONARY ANGIOGRAPHY N/A 06/28/2016   Procedure: Left Heart Cath and Coronary Angiography;  Surgeon: Victory LELON Sharps, MD;  Location: [facility] INVASIVE CV LAB;  Service: Cardiovascular;  Laterality: N/A;   LEFT HEART CATH AND CORONARY ANGIOGRAPHY N/A 02/25/2019   Procedure: LEFT HEART CATH AND CORONARY ANGIOGRAPHY;  Surgeon: Sharps Victory LELON, MD;  Location: [facility] INVASIVE CV LAB;  Service: Cardiovascular;  Laterality: N/A;   LEFT HEART CATHETERIZATION WITH CORONARY ANGIOGRAM N/A 10/03/2011   Procedure: LEFT HEART CATHETERIZATION WITH CORONARY ANGIOGRAM;  Surgeon: Victory LELON Sharps DOUGLAS, MD;  Location: [facility] CATH LAB;  Service: Cardiovascular;  Laterality: N/A;   LEFT HEART CATHETERIZATION WITH CORONARY ANGIOGRAM N/A 09/12/2012   Procedure: LEFT HEART CATHETERIZATION WITH CORONARY ANGIOGRAM;  Surgeon: Victory LELON Sharps DOUGLAS, MD;  Location: [facility] CATH LAB;  Service: Cardiovascular;  Laterality: N/A;   LIPOMA EXCISION     Biospy only; left parotid gland   LITHOTRIPSY     none     PERCUTANEOUS CORONARY STENT INTERVENTION (PCI-S) N/A 09/17/2012   Procedure: PERCUTANEOUS CORONARY STENT INTERVENTION (PCI-S);  Surgeon: Victory LELON Sharps DOUGLAS, MD;  Location: [facility] CATH LAB;  Service: Cardiovascular;  Laterality: N/A;   RIGHT/LEFT HEART CATH AND CORONARY ANGIOGRAPHY N/A 01/07/2024   Procedure: RIGHT/LEFT HEART CATH AND CORONARY ANGIOGRAPHY;  Surgeon: Wonda Sharper, MD;  Location: [facility] INVASIVE CV LAB;  Service: Cardiovascular;  Laterality: N/A;   TRIGGER FINGER RELEASE Left 12/21/2015   Procedure: LEFT LONG FINGER TRIGGER RELEASE ;  Surgeon: Alm Hummer, MD;  Location: [facility];  Service: Orthopedics;  Laterality: Left;    Current Outpatient  Medications  Medication Sig Dispense Refill   acetaminophen  (TYLENOL ) 325 MG tablet Take 2 tablets (650 mg total) by mouth every 6 (six) hours as needed for mild pain (pain score 1-3) (or Fever >/= 101).     albuterol  (PROVENTIL ) (2.5 MG/3ML) 0.083% nebulizer solution Take 2.5 mg by nebulization every 6 (six) hours as needed for wheezing or shortness of breath.     albuterol  (VENTOLIN  HFA) 108 (90 Base) MCG/ACT inhaler Inhale 2 puffs into the lungs every 6 (six) hours as needed for wheezing or shortness of breath.      allopurinol  (ZYLOPRIM ) 100 MG tablet Take 100 mg by mouth daily.     amLODipine  (NORVASC ) 2.5 MG tablet Take 2.5 mg by mouth daily.     aspirin  EC 81 MG tablet Take 1 tablet (81 mg total) by mouth daily. Swallow whole.     atorvastatin  (LIPITOR ) 80 MG tablet Take 1 tablet (80 mg total) by mouth daily at 6 PM. 30 tablet 0   Capsaicin 0.033 % CREA Apply 1 Application topically 2 (two) times daily as needed (pain).     carvedilol  (COREG ) 6.25 MG tablet Take 6.25 mg by mouth 2 (two) times daily with a meal.  cholecalciferol (VITAMIN D3) 25 MCG (1000 UT) tablet Take 1,000 Units by mouth daily.     clopidogrel  (PLAVIX ) 75 MG tablet Take 1 tablet (75 mg total) by mouth daily. 30 tablet 0   dapagliflozin  propanediol (FARXIGA ) 10 MG TABS tablet Take 10 mg by mouth daily.     Docusate Sodium  (DSS) 100 MG CAPS Take 1 capsule by mouth every evening.     Ferrous Sulfate  (IRON ) 325 (65 FE) MG TABS Take 325 mg by mouth daily.      finasteride  (PROSCAR ) 5 MG tablet Take 1 tablet (5 mg total) by mouth daily. 30 tablet 5   gabapentin  (NEURONTIN ) 300 MG capsule Take 2 capsules (600 mg total) by mouth at bedtime. 60 capsule 0   guaifenesin  (HUMIBID E) 400 MG TABS tablet Take 400 mg by mouth 2 (two) times daily.     isosorbide  mononitrate (IMDUR ) 120 MG 24 hr tablet Take 1 tablet (120 mg total) by mouth daily. 90 tablet 3   pantoprazole  (PROTONIX ) 40 MG tablet Take 40 mg by mouth daily.      primidone  (MYSOLINE ) 50 MG tablet Take 50 mg by mouth at bedtime.      sucralfate  (CARAFATE ) 1 GM/10ML suspension Take 10 mLs by mouth 2 (two) times daily as needed (heartburn).     traMADol  (ULTRAM ) 50 MG tablet Take 1 tablet (50 mg total) by mouth at bedtime as needed for moderate pain. (Patient taking differently: Take 50 mg by mouth See admin instructions. 1 tablet at 6pm and 1 at bedtime; occassionally a 3rd tablet as needed)     vitamin B-12 (CYANOCOBALAMIN ) 1000 MCG tablet Take 1,000 mcg by mouth daily.     No current facility-administered medications for this visit.    Allergies  Allergen Reactions   Cimetidine Other (See Comments)    Anxiety Attacks   Metformin  Hcl     Other reaction(s): decreased kidney function    Social History   Socioeconomic History   Marital status: Married    Spouse name: Not on file   Number of children: 2   Years of education: Not on file   Highest education level: Not on file  Occupational History   Occupation: retired   Occupation: Retired from [employer], coun[employer]  Tobacco Use   Smoking status: Former    Current packs/day: 0.00    Average packs/day: 1 pack/day for 64.0 years (64.0 ttl pk-yrs)    Types: Cigarettes    Start date: 05/29/1947    Quit date: 05/29/2011    Years since quitting: 12.7   Smokeless tobacco: Never  Vaping Use   Vaping status: Never Used  Substance and Sexual Activity   Alcohol use: Yes    Comment: rarely   Drug use: No   Sexual activity: Not Currently  Other Topics Concern   Not on file  Social History Narrative   Denies Caffeine use    Social Drivers of Corporate Investment Banker Strain: Not on file  Food Insecurity: No Food Insecurity (03/10/2024)   Hunger Vital Sign    Worried About Running Out of Food in the Last Year: Never true    Ran Out of Food in the Last Year: Never true  Transportation Needs: No Transportation Needs (03/10/2024)   PRAPARE - Administrator, Civil Service  (Medical): No    Lack of Transportation (Non-Medical): No  Physical Activity: Not on file  Stress: Not on file  Social Connections: Not on file  Intimate Partner  Violence: Not At Risk (03/10/2024)   Humiliation, Afraid, Rape, and Kick questionnaire    Fear of Current or Ex-Partner: No    Emotionally Abused: No    Physically Abused: No    Sexually Abused: No    Family History  Problem Relation Age of Onset   Aortic aneurysm Father    Aneurysm Mother        brain   Cancer Brother    COPD Sister    Other Sister        health hx unknown   Other Sister        health hx unknown   Other Brother        health hx unknown    Review of Systems:  As stated in the HPI and otherwise negative.   There were no vitals taken for this visit.  Physical Examination: General: Well developed, well nourished, NAD  HEENT: OP clear, mucus membranes moist  SKIN: warm, dry. No rashes. Neuro: No focal deficits  Musculoskeletal: Muscle strength 5/5 all ext  Psychiatric: Mood and affect normal  Neck: No JVD, no carotid bruits, no thyromegaly, no lymphadenopathy.  Lungs:Clear bilaterally, no wheezes, rhonci, crackles Cardiovascular: Regular rate and rhythm. No murmurs, gallops or rubs. Abdomen:Soft. Bowel sounds present. Non-tender.  Extremities: No lower extremity edema. Pulses are 2 + in the bilateral DP/PT.  EKG:  EKG is *** ordered today. The ekg ordered today demonstrates   Recent Labs: 02/29/2024: ALT 29 03/05/2024: BUN 38; Creatinine, Ser 2.64; Hemoglobin 12.0; Magnesium  1.6; Platelets 122; Potassium 4.2; Sodium 139    Wt Readings from Last 3 Encounters:  03/05/24 195 lb 1.7 oz (88.5 kg)  02/15/24 188 lb (85.3 kg)  01/14/24 189 lb 6.4 oz (85.9 kg)    Assessment and Plan:   1. Severe Aortic Valve Stenosis s/p TAVR: He is doing well one week post TAVR. No neuro symptoms. Groins stable. EKG today with ***. NYHA class *** -Continue ASA, Plavix , statin and Coreg .  -SBE prophylaxis as  indicated  2. CVA: No residual neurological deficits. Continue ASA and statin.   3. CKD, stage IV: ***    Labs/ tests ordered today include:  No orders of the defined types were placed in this encounter.  Disposition:   F/U will be arranged with the structural team  Signed, Lonni Cash, MD, [facility] 03/12/2024 4:02 PM    [facility] [address] [place], [place], [place]  [address] Phone: [phone]; Fax: [phone]

## 2024-03-13 ENCOUNTER — Ambulatory Visit: Payer: Medicare (Managed Care) | Attending: Cardiovascular Disease | Admitting: Cardiovascular Disease

## 2024-03-13 ENCOUNTER — Encounter: Payer: Self-pay | Admitting: Cardiovascular Disease

## 2024-03-13 VITALS — BP 136/60 | HR 63 | Ht 68.0 in | Wt 196.2 lb

## 2024-03-13 DIAGNOSIS — Z952 Presence of prosthetic heart valve: Secondary | ICD-10-CM

## 2024-03-13 DIAGNOSIS — I350 Nonrheumatic aortic (valve) stenosis: Secondary | ICD-10-CM | POA: Diagnosis not present

## 2024-03-13 NOTE — Patient Instructions (Signed)
 Medication Instructions:  Your physician recommends that you continue on your current medications as directed. Please refer to the Current Medication list given to you today.  *If you need a refill on your cardiac medications before your next appointment, please call your pharmacy*  Lab Work: Have lab work (BMP) drawn today in the lab on the first floor If you have labs (blood work) drawn today and your tests are completely normal, you will receive your results only by: MyChart Message (if you have MyChart) OR A paper copy in the mail If you have any lab test that is abnormal or we need to change your treatment, we will call you to review the results.  Testing/Procedures: Your physician has requested that you have an echocardiogram. Echocardiography is a painless test that uses sound waves to create images of your heart. It provides your doctor with information about the size and shape of your heart and how well your heart's chambers and valves are working. This procedure takes approximately one hour. There are no restrictions for this procedure. Please do NOT wear cologne, perfume, aftershave, or lotions (deodorant is allowed). Please arrive 15 minutes prior to your appointment time.  Please note: We ask at that you not bring children with you during ultrasound (echo/ vascular) testing. Due to room size and safety concerns, children are not allowed in the ultrasound rooms during exams. Our front office staff cannot provide observation of children in our lobby area while testing is being conducted. An adult accompanying a patient to their appointment will only be allowed in the ultrasound room at the discretion of the ultrasound technician under special circumstances. We apologize for any inconvenience. Scheduled for 1/8  Follow-Up: At [facility], you and your health needs are our priority.  As part of our continuing mission to provide you with exceptional heart care, our providers  are all part of one team.  This team includes your primary Cardiologist (physician) and Advanced Practice Providers or APPs (Physician Assistants and Nurse Practitioners) who all work together to provide you with the care you need, when you need it.  Your next appointment:   04/17/24  Provider:   Izetta Hummer, PA-C    We recommend signing up for the patient portal called MyChart.  Sign up information is provided on this After Visit Summary.  MyChart is used to connect with patients for Virtual Visits (Telemedicine).  Patients are able to view lab/test results, encounter notes, upcoming appointments, etc.  Non-urgent messages can be sent to your provider as well.   To learn more about what you can do with MyChart, go to [email or web address].   Other Instructions

## 2024-03-14 ENCOUNTER — Ambulatory Visit: Payer: Self-pay | Admitting: Cardiovascular Disease

## 2024-03-14 LAB — BASIC METABOLIC PANEL WITH GFR
BUN/Creatinine Ratio: 18 (ref 10–24)
BUN: 45 mg/dL — ABNORMAL HIGH (ref 8–27)
CO2: 20 mmol/L (ref 20–29)
Calcium: 9.3 mg/dL (ref 8.6–10.2)
Chloride: 103 mmol/L (ref 96–106)
Creatinine, Ser: 2.56 mg/dL — ABNORMAL HIGH (ref 0.76–1.27)
Glucose: 180 mg/dL — ABNORMAL HIGH (ref 70–99)
Potassium: 4.6 mmol/L (ref 3.5–5.2)
Sodium: 139 mmol/L (ref 134–144)
eGFR: 24 mL/min/1.73 — ABNORMAL LOW (ref >59)

## 2024-03-24 DIAGNOSIS — M65341 Trigger finger, right ring finger: Secondary | ICD-10-CM | POA: Diagnosis not present

## 2024-03-24 DIAGNOSIS — M65322 Trigger finger, left index finger: Secondary | ICD-10-CM | POA: Diagnosis not present

## 2024-03-26 ENCOUNTER — Ambulatory Visit: Payer: Medicare (Managed Care) | Admitting: Podiatry

## 2024-03-26 DIAGNOSIS — Z0189 Encounter for other specified special examinations: Secondary | ICD-10-CM

## 2024-03-26 DIAGNOSIS — B351 Tinea unguium: Secondary | ICD-10-CM

## 2024-03-26 DIAGNOSIS — E119 Type 2 diabetes mellitus without complications: Secondary | ICD-10-CM | POA: Diagnosis not present

## 2024-03-26 DIAGNOSIS — M79674 Pain in right toe(s): Secondary | ICD-10-CM | POA: Diagnosis not present

## 2024-03-26 DIAGNOSIS — M205X1 Other deformities of toe(s) (acquired), right foot: Secondary | ICD-10-CM

## 2024-03-26 DIAGNOSIS — M79675 Pain in left toe(s): Secondary | ICD-10-CM | POA: Diagnosis not present

## 2024-03-26 DIAGNOSIS — E1142 Type 2 diabetes mellitus with diabetic polyneuropathy: Secondary | ICD-10-CM

## 2024-03-30 ENCOUNTER — Encounter: Payer: Self-pay | Admitting: Podiatry

## 2024-03-30 NOTE — Progress Notes (Signed)
"  °  Subjective:  Patient ID: Marco Acevedo, male    DOB: 1934-07-28,  MRN: [id number]  Marco Acevedo presents to clinic today for at risk foot care with history of diabetic neuropathy and painful mycotic toenails of both feet that are difficult to trim. Pain interferes with daily activities and wearing enclosed shoe gear comfortably.  Chief Complaint  Patient presents with   [facility]    [facility] A1c 6.8, 03/05/24 Marco Carlin Redbird, MD (PCP),     New problem(s): None.   PCP is Marco Carlin Redbird, MD.  Allergies[1]  Review of Systems: Negative except as noted in the HPI.  Objective:  There were no vitals filed for this visit. Marco Acevedo is a pleasant 89 y.o. male WD, WN in NAD. AAO x 3.   Vascular Examination: Vascular status intact b/l with palpable pedal pulses. CFT immediate b/l. Pedal hair present. No edema. No pain with calf compression b/l. Skin temperature gradient WNL b/l. No varicosities noted. No cyanosis or clubbing noted.  Neurological Examination: Pt has subjective symptoms of neuropathy. Sensation diminished b/l.   Dermatological Examination: Pedal skin with normal turgor, texture and tone b/l. No open wounds nor interdigital macerations noted. Anonychia b/l great toes. Toenails 2-5 b/l thick, discolored, elongated with subungual debris and pain on dorsal palpation.   Musculoskeletal Examination: Muscle strength 5/5 to b/l LE.  No pain, crepitus noted b/l. Clawtoe deformity R 3rd toe.  Radiographs: None  Assessment/Plan: 1. Pain due to onychomycosis of toenails of both feet   2. Acquired claw toe of right foot   3. Diabetic peripheral neuropathy associated with type 2 diabetes mellitus (HCC)   4. Encounter for diabetic foot exam [facility])   -Patient was evaluated today. All questions/concerns addressed on today's visit. -Diabetic foot examination performed today. -Continue diabetic foot care principles: inspect feet daily, monitor glucose as recommended by PCP and/or  Endocrinologist, and follow prescribed diet per PCP, Endocrinologist and/or dietician. -Patient to continue soft, supportive shoe gear daily. -Mycotic toenails 2-5 bilaterally were debrided in length and girth with sterile nail nippers and dremel without iatrogenic bleeding. -Patient/POA to call should there be question/concern in the interim.   Return in about 3 months (around 06/24/2024).  Marco Acevedo, DPM      [place] LOCATION: [address] [place] [address]                   Office [phone]   [facility] LOCATION: [address] [place], [place] [address] Office [phone]      [1]  Allergies Allergen Reactions   Cimetidine Other (See Comments)    Anxiety Attacks   Metformin  Hcl     Other reaction(s): decreased kidney function   "

## 2024-03-31 ENCOUNTER — Telehealth: Payer: Self-pay | Admitting: Podiatry

## 2024-03-31 NOTE — Telephone Encounter (Signed)
 Per Dr. Gaynel fax last notes to Dr. Delana [phone]

## 2024-04-14 NOTE — Progress Notes (Signed)
 " [facility]   MULTIDISCIPLINARY HEART VALVE CLINIC                                     Cardiology Office Note:    Date:  04/17/2024   ID:  Marco Acevedo, DOB [birth year], MRN [id number]  PCP:  Okey Carlin Redbird, MD  [facility] Cardiologist:  Oneil Parchment, MD  [facility] Structural heart: Lonni Cash, MD [facility] Electrophysiologist:  None   Referring MD: Okey Carlin Redbird, MD   1 month s/p TAVR   History of Present Illness:    Marco Acevedo is a 89 y.o. male with a hx of anemia, DMT2, COPD, PAD on imaging, chronic diastolic CHF, CAD, obesity, CKD stage IV, HTN, HLD, previous stroke, sleep apnea and severe aortic stenosis s/p TAVR (03/04/24) c/b acute CVA who presents to clinic for follow up.   He is known to have moderate CAD and moderate aortic stenosis. He had recent chest pain while hiking at high altitudes. Echo 12/28/23 with LVEF 65-70%. Normal RV function. Moderately severe aortic stenosis with mean gradient 36.5 mmHg, AVA 1.0 cm2, SVI 51, DI 0.21. Cardiac cath 01/07/24 with patent Circumflex stents with non-obstructive disease noted in the LAD, Circumflex and RCA. Severe aortic stenosis with mean gradient 37 mmHg, AVA 0.78 cm2. He underwent TAVR on 03/04/24 with placement of a 26 mm Edwards Sapien 3 Ultra Resilia THV from the femoral approach. Following his procedure he became hypotensive on the floor requiring Levophed  infusion and transfer to the ICU. Echo with small pericardial effusion. He had right arm heaviness and tingling in his hand. Brain MRI showed small acute left MCA infarct and seen by neurology. His blood pressure improved and Levophed  was stopped. Echo on 03/05/24 with normal LV systolic function. Small pericardial effusion. Normally functioning AVR with no PVL.   Today the patient presents to clinic for follow up. Working with hand therapy and right arm/hand is almost back to normal function. Still has mild right tingling and loss of  sensation in three fingers. No CP or SOB. No LE edema, orthopnea or PND. No dizziness or syncope. No blood in stool or urine. No palpitations. Has gas like pain in chest that is non exertional, positional and fleeting.    Past Medical History:  Diagnosis Date   Anemia    low iron    Aortic stenosis    Arthritis    BPH (benign prostatic hyperplasia)    CAD (coronary artery disease)    Chronic diastolic CHF (congestive heart failure) (HCC)    CKD (chronic kidney disease), stage IV (HCC)    Claustrophobia    Complication of anesthesia    has to have head elevated to keep from strangling on post nasal drip   COPD (chronic obstructive pulmonary disease) (HCC)    Diabetes mellitus type II    type 2   GERD (gastroesophageal reflux disease)    Gout    Granuloma annulare    History of hiatal hernia    History of kidney stones    Litthotrispy   Hyperlipidemia    Hypertension    Neuropathy    Obesity    OSA (obstructive sleep apnea)    S/P TAVR (transcatheter aortic valve replacement) 03/04/2024   s/p TAVR with a 26 mm Edwards Sapien 3 Ultra Resilia THV via the TF approach by Dr. Cash and Dr. Lightfoot  Stroke (HCC) 10/2017   Weakness right arm and leg     Current Medications: Active Medications[1]    ROS:   Please see the history of present illness.    All other systems reviewed and are negative.  EKGs       Risk Assessment/Calculations:           Physical Exam:    VS:  BP 132/64   Pulse 71   Ht 5' 8 (1.727 m)   Wt 189 lb 12.8 oz (86.1 kg)   SpO2 96%   BMI 28.86 kg/m     Wt Readings from Last 3 Encounters:  04/17/24 189 lb 12.8 oz (86.1 kg)  03/13/24 196 lb 3.2 oz (89 kg)  03/05/24 195 lb 1.7 oz (88.5 kg)     GEN: Well nourished, well developed in no acute distress NECK: No JVD CARDIAC: RRR, no murmurs, rubs, gallops RESPIRATORY:  Clear to auscultation without rales, wheezing or rhonchi  ABDOMEN: Soft, non-tender, non-distended EXTREMITIES:  No  edema; No deformity.    ASSESSMENT:    1. S/P TAVR (transcatheter aortic valve replacement)   2. Cerebrovascular accident (CVA), unspecified mechanism (HCC)   3. CAD S/P percutaneous coronary angioplasty   4. Chronic heart failure with preserved ejection fraction (HFpEF) (HCC)   5. Essential hypertension   6. CKD (chronic kidney disease) stage 4, GFR 15-29 ml/min (HCC)   7. Dissection of aorta, unspecified portion of aorta (HCC)     PLAN:    In order of problems listed above:  Severe AS s/p TAVR:  -- Echo today shows EF 65%, mild cLVH, mod MAC and normally functioning TAVR with a mean gradient of 8.2 mm hg and no PVL.  -- NYHA class I symptoms.  -- He had been on chronic aspirin  and plavix  (predated TAVR and CVA). Will stop aspirin  81mg  daily at this time and continue on Plavix  75 mg daily.  -- SBE prophylaxis discussed; I have RX'd amoxicillin .  -- I will see back for 1 year office visit with echo.  CVA:  -- Minimal residual neurological deficits. Working with hand therapy.  -- Continue Plavix  75mg  daily and atorvastatin  80mg  daily.   CAD: -- Cardiac cath 01/07/24 with patent Circumflex stents with non-obstructive disease noted in the LAD, Circumflex and RCA. -- Continue medical therapy.    Chronic HFpEF: -- LVEDP 15 mm hg at the time of TAVR.  -- Continue Jardiance 10mg  daily.    HTN: -- BP well controlled.  -- Continue amlodipine  2.5mg  daily, carvedilol  6.25 mg BID and Imdur  120 mg daily.    CKD stage IV: -- Creat stable at 2.56 on last check (personally reviewed). -- Followed by Dr. Norine.    Aortic dissection: -- Pre TAVR CTs showed a linear filling defect in the infrarenal abdominal aorta which may represent a web-like plaque or sequelae of prior focal dissection.  -- Reviewed by team and no specific follow up needed.     Medication Adjustments/Labs and Tests Ordered: Current medicines are reviewed at length with the patient today.  Concerns regarding medicines  are outlined above.  Orders Placed This Encounter  Procedures   ECHOCARDIOGRAM COMPLETE   Meds ordered this encounter  Medications   amoxicillin  (AMOXIL ) 500 MG tablet    Sig: Take 4 tablets (2,000 mg total) by mouth as directed. 1 hour prior to dental work including cleanings    Dispense:  12 tablet    Refill:  12    Supervising Provider:  COOPER, MICHAEL [[id number]]    Patient Instructions  Medication Instructions:  Your physician has recommended you make the following change in your medication:  STOP taking aspirin  81mg . START Amoxicillin  500 mg, take 4 tablets by mouth 1 hour prior to dental procedures and cleanings.    *If you need a refill on your cardiac medications before your next appointment, please call your pharmacy*  Lab Work: None needed If you have labs (blood work) drawn today and your tests are completely normal, you will receive your results only by: MyChart Message (if you have MyChart) OR A paper copy in the mail If you have any lab test that is abnormal or we need to change your treatment, we will call you to review the results.  Testing/Procedures: 02/16/2025 Your physician has requested that you have an echocardiogram. Echocardiography is a painless test that uses sound waves to create images of your heart. It provides your doctor with information about the size and shape of your heart and how well your hearts chambers and valves are working. This procedure takes approximately one hour. There are no restrictions for this procedure. Please do NOT wear cologne, perfume, aftershave, or lotions (deodorant is allowed). Please arrive 15 minutes prior to your appointment time.  Please note: We ask at that you not bring children with you during ultrasound (echo/ vascular) testing. Due to room size and safety concerns, children are not allowed in the ultrasound rooms during exams. Our front office staff cannot provide observation of children in our lobby area while testing  is being conducted. An adult accompanying a patient to their appointment will only be allowed in the ultrasound room at the discretion of the ultrasound technician under special circumstances. We apologize for any inconvenience.   Follow-Up: At [facility], you and your health needs are our priority.  As part of our continuing mission to provide you with exceptional heart care, our providers are all part of one team.  This team includes your primary Cardiologist (physician) and Advanced Practice Providers or APPs (Physician Assistants and Nurse Practitioners) who all work together to provide you with the care you need, when you need it.  Your next appointment:   As scheduled on 09/17/2024  Provider:   Dr. Jeffrie  We recommend signing up for the patient portal called MyChart.  Sign up information is provided on this After Visit Summary.  MyChart is used to connect with patients for Virtual Visits (Telemedicine).  Patients are able to view lab/test results, encounter notes, upcoming appointments, etc.  Non-urgent messages can be sent to your provider as well.   To learn more about what you can do with MyChart, go to [email or web address].          Signed, Lamarr Hummer, PA-C  04/17/2024 3:18 PM    [facility]     [1]  Current Meds  Medication Sig   acetaminophen  (TYLENOL ) 325 MG tablet Take 2 tablets (650 mg total) by mouth every 6 (six) hours as needed for mild pain (pain score 1-3) (or Fever >/= 101).   albuterol  (PROVENTIL ) (2.5 MG/3ML) 0.083% nebulizer solution Take 2.5 mg by nebulization every 6 (six) hours as needed for wheezing or shortness of breath.   albuterol  (VENTOLIN  HFA) 108 (90 Base) MCG/ACT inhaler Inhale 2 puffs into the lungs every 6 (six) hours as needed for wheezing or shortness of breath.    allopurinol  (ZYLOPRIM ) 100 MG tablet Take 100 mg by mouth daily.   amLODipine  (NORVASC ) 2.5  MG tablet Take 2.5 mg by mouth daily.    amoxicillin  (AMOXIL ) 500 MG tablet Take 4 tablets (2,000 mg total) by mouth as directed. 1 hour prior to dental work including cleanings   aspirin  EC 81 MG tablet Take 1 tablet (81 mg total) by mouth daily. Swallow whole.   atorvastatin  (LIPITOR ) 80 MG tablet Take 1 tablet (80 mg total) by mouth daily at 6 PM.   Capsaicin 0.033 % CREA Apply 1 Application topically 2 (two) times daily as needed (pain).   carvedilol  (COREG ) 6.25 MG tablet Take 6.25 mg by mouth 2 (two) times daily with a meal.   cholecalciferol (VITAMIN D3) 25 MCG (1000 UT) tablet Take 1,000 Units by mouth daily.   clopidogrel  (PLAVIX ) 75 MG tablet Take 1 tablet (75 mg total) by mouth daily.   dapagliflozin  propanediol (FARXIGA ) 10 MG TABS tablet Take 10 mg by mouth daily.   Docusate Sodium  (DSS) 100 MG CAPS Take 1 capsule by mouth every evening.   Ferrous Sulfate  (IRON ) 325 (65 FE) MG TABS Take 325 mg by mouth daily.    finasteride  (PROSCAR ) 5 MG tablet Take 1 tablet (5 mg total) by mouth daily.   gabapentin  (NEURONTIN ) 300 MG capsule Take 2 capsules (600 mg total) by mouth at bedtime.   guaifenesin  (HUMIBID E) 400 MG TABS tablet Take 400 mg by mouth 2 (two) times daily.   isosorbide  mononitrate (IMDUR ) 120 MG 24 hr tablet Take 1 tablet (120 mg total) by mouth daily.   pantoprazole  (PROTONIX ) 40 MG tablet Take 40 mg by mouth daily.   primidone  (MYSOLINE ) 50 MG tablet Take 50 mg by mouth at bedtime.    sucralfate  (CARAFATE ) 1 GM/10ML suspension Take 10 mLs by mouth 2 (two) times daily as needed (heartburn).   traMADol  (ULTRAM ) 50 MG tablet Take 1 tablet (50 mg total) by mouth at bedtime as needed for moderate pain.   vitamin B-12 (CYANOCOBALAMIN ) 1000 MCG tablet Take 1,000 mcg by mouth daily.   "

## 2024-04-15 DIAGNOSIS — Z952 Presence of prosthetic heart valve: Secondary | ICD-10-CM

## 2024-04-17 ENCOUNTER — Ambulatory Visit: Payer: Self-pay | Admitting: Physician Assistant

## 2024-04-17 ENCOUNTER — Ambulatory Visit
Admission: RE | Admit: 2024-04-17 | Discharge: 2024-04-17 | Disposition: A | Discharge status: Home / Self Care | Payer: Medicare (Managed Care) | Attending: Cardiovascular Disease | Admitting: Cardiovascular Disease

## 2024-04-17 ENCOUNTER — Ambulatory Visit (INDEPENDENT_AMBULATORY_CARE_PROVIDER_SITE_OTHER): Payer: Medicare (Managed Care) | Admitting: Physician Assistant

## 2024-04-17 VITALS — BP 132/64 | HR 71 | Ht 68.0 in | Wt 189.8 lb

## 2024-04-17 DIAGNOSIS — N184 Chronic kidney disease, stage 4 (severe): Secondary | ICD-10-CM | POA: Diagnosis not present

## 2024-04-17 DIAGNOSIS — I2510 Atherosclerotic heart disease of native coronary artery without angina pectoris: Secondary | ICD-10-CM

## 2024-04-17 DIAGNOSIS — I10 Essential (primary) hypertension: Secondary | ICD-10-CM | POA: Insufficient documentation

## 2024-04-17 DIAGNOSIS — I639 Cerebral infarction, unspecified: Secondary | ICD-10-CM | POA: Insufficient documentation

## 2024-04-17 DIAGNOSIS — Z952 Presence of prosthetic heart valve: Secondary | ICD-10-CM | POA: Insufficient documentation

## 2024-04-17 DIAGNOSIS — Z9861 Coronary angioplasty status: Secondary | ICD-10-CM | POA: Diagnosis not present

## 2024-04-17 DIAGNOSIS — I7100 Dissection of unspecified site of aorta: Secondary | ICD-10-CM

## 2024-04-17 DIAGNOSIS — I5032 Chronic diastolic (congestive) heart failure: Secondary | ICD-10-CM | POA: Diagnosis not present

## 2024-04-17 LAB — ECHOCARDIOGRAM COMPLETE
AV Mean grad: 8.2 mmHg
AV Peak grad: 14.3 mmHg
Ao pk vel: 1.89 m/s
Area-P 1/2: 3.21 cm2
S' Lateral: 3.5 cm

## 2024-04-17 MED ORDER — AMOXICILLIN 500 MG PO TABS: 2000.0000 mg | ORAL_TABLET | ORAL | 12 refills | Status: AC

## 2024-04-17 NOTE — Patient Instructions (Addendum)
 Medication Instructions:  Your physician has recommended you make the following change in your medication:  STOP taking aspirin  81mg . START Amoxicillin  500 mg, take 4 tablets by mouth 1 hour prior to dental procedures and cleanings.    *If you need a refill on your cardiac medications before your next appointment, please call your pharmacy*  Lab Work: None needed If you have labs (blood work) drawn today and your tests are completely normal, you will receive your results only by: MyChart Message (if you have MyChart) OR A paper copy in the mail If you have any lab test that is abnormal or we need to change your treatment, we will call you to review the results.  Testing/Procedures: 02/16/2025 Your physician has requested that you have an echocardiogram. Echocardiography is a painless test that uses sound waves to create images of your heart. It provides your doctor with information about the size and shape of your heart and how well your hearts chambers and valves are working. This procedure takes approximately one hour. There are no restrictions for this procedure. Please do NOT wear cologne, perfume, aftershave, or lotions (deodorant is allowed). Please arrive 15 minutes prior to your appointment time.  Please note: We ask at that you not bring children with you during ultrasound (echo/ vascular) testing. Due to room size and safety concerns, children are not allowed in the ultrasound rooms during exams. Our front office staff cannot provide observation of children in our lobby area while testing is being conducted. An adult accompanying a patient to their appointment will only be allowed in the ultrasound room at the discretion of the ultrasound technician under special circumstances. We apologize for any inconvenience.   Follow-Up: At [facility], you and your health needs are our priority.  As part of our continuing mission to provide you with exceptional heart care, our providers  are all part of one team.  This team includes your primary Cardiologist (physician) and Advanced Practice Providers or APPs (Physician Assistants and Nurse Practitioners) who all work together to provide you with the care you need, when you need it.  Your next appointment:   As scheduled on 09/17/2024  Provider:   Dr. Jeffrie  We recommend signing up for the patient portal called MyChart.  Sign up information is provided on this After Visit Summary.  MyChart is used to connect with patients for Virtual Visits (Telemedicine).  Patients are able to view lab/test results, encounter notes, upcoming appointments, etc.  Non-urgent messages can be sent to your provider as well.   To learn more about what you can do with MyChart, go to [email or web address].

## 2024-07-01 ENCOUNTER — Ambulatory Visit: Payer: Medicare (Managed Care) | Admitting: Diagnostic Neuroimaging

## 2024-07-09 ENCOUNTER — Ambulatory Visit: Payer: Medicare (Managed Care) | Admitting: Podiatry

## 2024-09-17 ENCOUNTER — Ambulatory Visit: Payer: Medicare (Managed Care) | Admitting: Cardiology

## 2025-02-16 ENCOUNTER — Ambulatory Visit (HOSPITAL_COMMUNITY): Payer: Medicare (Managed Care)

## 2025-02-16 ENCOUNTER — Ambulatory Visit: Payer: Medicare (Managed Care) | Admitting: Physician Assistant
